# Patient Record
Sex: Male | Born: 1955 | Race: White | Hispanic: No | Marital: Married | Smoking: Former smoker
Health system: Southern US, Community
[De-identification: ages and names within clinical notes are randomized; demographics above are authoritative.]

## PROBLEM LIST (undated history)

## (undated) DIAGNOSIS — I10 Essential (primary) hypertension: Secondary | ICD-10-CM

## (undated) DIAGNOSIS — I639 Cerebral infarction, unspecified: Secondary | ICD-10-CM

## (undated) DIAGNOSIS — I739 Peripheral vascular disease, unspecified: Secondary | ICD-10-CM

## (undated) DIAGNOSIS — I771 Stricture of artery: Secondary | ICD-10-CM

## (undated) DIAGNOSIS — N400 Enlarged prostate without lower urinary tract symptoms: Secondary | ICD-10-CM

## (undated) HISTORY — PX: ILIAC ARTERY STENT: SHX1786

## (undated) HISTORY — DX: Cerebral infarction, unspecified: I63.9

## (undated) HISTORY — PX: BRAIN SURGERY: SHX531

---

## 2017-10-16 DIAGNOSIS — R030 Elevated blood-pressure reading, without diagnosis of hypertension: Secondary | ICD-10-CM | POA: Insufficient documentation

## 2020-05-12 LAB — COLOGUARD

## 2021-03-20 LAB — COLOGUARD: Cologuard: NEGATIVE

## 2021-03-27 LAB — COLOGUARD: COLOGUARD: NEGATIVE

## 2021-08-29 ENCOUNTER — Emergency Department (HOSPITAL_COMMUNITY): Payer: 59

## 2021-08-29 ENCOUNTER — Inpatient Hospital Stay (HOSPITAL_COMMUNITY)
Admission: EM | Admit: 2021-08-29 | Discharge: 2021-09-04 | DRG: 023 | Disposition: A | Discharge status: Rehabilitation Facility | Payer: 59 | Attending: Neurology | Admitting: Neurology

## 2021-08-29 ENCOUNTER — Encounter (HOSPITAL_COMMUNITY): Payer: Self-pay | Admitting: Student in an Organized Health Care Education/Training Program

## 2021-08-29 ENCOUNTER — Inpatient Hospital Stay (HOSPITAL_COMMUNITY): Payer: 59

## 2021-08-29 DIAGNOSIS — E785 Hyperlipidemia, unspecified: Secondary | ICD-10-CM | POA: Diagnosis present

## 2021-08-29 DIAGNOSIS — I63541 Cerebral infarction due to unspecified occlusion or stenosis of right cerebellar artery: Secondary | ICD-10-CM | POA: Diagnosis not present

## 2021-08-29 DIAGNOSIS — Z79899 Other long term (current) drug therapy: Secondary | ICD-10-CM | POA: Diagnosis not present

## 2021-08-29 DIAGNOSIS — I358 Other nonrheumatic aortic valve disorders: Secondary | ICD-10-CM | POA: Diagnosis present

## 2021-08-29 DIAGNOSIS — I651 Occlusion and stenosis of basilar artery: Secondary | ICD-10-CM | POA: Diagnosis not present

## 2021-08-29 DIAGNOSIS — I6340 Cerebral infarction due to embolism of unspecified cerebral artery: Secondary | ICD-10-CM | POA: Insufficient documentation

## 2021-08-29 DIAGNOSIS — Z95820 Peripheral vascular angioplasty status with implants and grafts: Secondary | ICD-10-CM

## 2021-08-29 DIAGNOSIS — R739 Hyperglycemia, unspecified: Secondary | ICD-10-CM | POA: Diagnosis present

## 2021-08-29 DIAGNOSIS — I739 Peripheral vascular disease, unspecified: Secondary | ICD-10-CM | POA: Diagnosis present

## 2021-08-29 DIAGNOSIS — R339 Retention of urine, unspecified: Secondary | ICD-10-CM | POA: Diagnosis not present

## 2021-08-29 DIAGNOSIS — E876 Hypokalemia: Secondary | ICD-10-CM | POA: Diagnosis not present

## 2021-08-29 DIAGNOSIS — H9191 Unspecified hearing loss, right ear: Secondary | ICD-10-CM | POA: Diagnosis present

## 2021-08-29 DIAGNOSIS — I161 Hypertensive emergency: Secondary | ICD-10-CM | POA: Diagnosis not present

## 2021-08-29 DIAGNOSIS — R233 Spontaneous ecchymoses: Secondary | ICD-10-CM | POA: Diagnosis present

## 2021-08-29 DIAGNOSIS — R531 Weakness: Secondary | ICD-10-CM | POA: Diagnosis present

## 2021-08-29 DIAGNOSIS — R270 Ataxia, unspecified: Secondary | ICD-10-CM | POA: Diagnosis present

## 2021-08-29 DIAGNOSIS — I10 Essential (primary) hypertension: Secondary | ICD-10-CM | POA: Diagnosis present

## 2021-08-29 DIAGNOSIS — Z87891 Personal history of nicotine dependence: Secondary | ICD-10-CM | POA: Diagnosis not present

## 2021-08-29 DIAGNOSIS — I6302 Cerebral infarction due to thrombosis of basilar artery: Secondary | ICD-10-CM | POA: Diagnosis present

## 2021-08-29 DIAGNOSIS — I6322 Cerebral infarction due to unspecified occlusion or stenosis of basilar arteries: Secondary | ICD-10-CM | POA: Diagnosis not present

## 2021-08-29 DIAGNOSIS — I63111 Cerebral infarction due to embolism of right vertebral artery: Secondary | ICD-10-CM | POA: Diagnosis not present

## 2021-08-29 DIAGNOSIS — Z20822 Contact with and (suspected) exposure to covid-19: Secondary | ICD-10-CM | POA: Diagnosis present

## 2021-08-29 DIAGNOSIS — Z888 Allergy status to other drugs, medicaments and biological substances status: Secondary | ICD-10-CM | POA: Diagnosis not present

## 2021-08-29 DIAGNOSIS — R471 Dysarthria and anarthria: Secondary | ICD-10-CM | POA: Diagnosis present

## 2021-08-29 DIAGNOSIS — K5901 Slow transit constipation: Secondary | ICD-10-CM | POA: Diagnosis not present

## 2021-08-29 DIAGNOSIS — R1310 Dysphagia, unspecified: Secondary | ICD-10-CM | POA: Diagnosis present

## 2021-08-29 DIAGNOSIS — Z7982 Long term (current) use of aspirin: Secondary | ICD-10-CM | POA: Diagnosis not present

## 2021-08-29 DIAGNOSIS — R29810 Facial weakness: Secondary | ICD-10-CM | POA: Diagnosis present

## 2021-08-29 DIAGNOSIS — I639 Cerebral infarction, unspecified: Secondary | ICD-10-CM

## 2021-08-29 DIAGNOSIS — I69351 Hemiplegia and hemiparesis following cerebral infarction affecting right dominant side: Secondary | ICD-10-CM | POA: Diagnosis not present

## 2021-08-29 DIAGNOSIS — I951 Orthostatic hypotension: Secondary | ICD-10-CM | POA: Diagnosis not present

## 2021-08-29 DIAGNOSIS — H5500 Unspecified nystagmus: Secondary | ICD-10-CM | POA: Diagnosis present

## 2021-08-29 DIAGNOSIS — Q2112 Patent foramen ovale: Secondary | ICD-10-CM | POA: Diagnosis not present

## 2021-08-29 HISTORY — DX: Essential (primary) hypertension: I10

## 2021-08-29 HISTORY — DX: Peripheral vascular disease, unspecified: I73.9

## 2021-08-29 LAB — COMPREHENSIVE METABOLIC PANEL
ALT: 15 U/L (ref 0–44)
AST: 22 U/L (ref 15–41)
Albumin: 3.7 g/dL (ref 3.5–5.0)
Alkaline Phosphatase: 59 U/L (ref 38–126)
Anion gap: 11 (ref 5–15)
BUN: 10 mg/dL (ref 8–23)
CO2: 17 mmol/L — ABNORMAL LOW (ref 22–32)
Calcium: 9 mg/dL (ref 8.9–10.3)
Chloride: 108 mmol/L (ref 98–111)
Creatinine, Ser: 1.03 mg/dL (ref 0.61–1.24)
GFR, Estimated: >60 mL/min (ref >60)
Glucose, Bld: 183 mg/dL — ABNORMAL HIGH (ref 70–99)
Potassium: 3.6 mmol/L (ref 3.5–5.1)
Sodium: 136 mmol/L (ref 135–145)
Total Bilirubin: 0.8 mg/dL (ref 0.3–1.2)
Total Protein: 6.4 g/dL — ABNORMAL LOW (ref 6.5–8.1)

## 2021-08-29 LAB — CBC
HCT: 41 % (ref 39.0–52.0)
HCT: 43.7 % (ref 39.0–52.0)
Hemoglobin: 13.7 g/dL (ref 13.0–17.0)
Hemoglobin: 14.6 g/dL (ref 13.0–17.0)
MCH: 31.7 pg (ref 26.0–34.0)
MCH: 31.9 pg (ref 26.0–34.0)
MCHC: 33.4 g/dL (ref 30.0–36.0)
MCHC: 33.4 g/dL (ref 30.0–36.0)
MCV: 94.9 fL (ref 80.0–100.0)
MCV: 95.6 fL (ref 80.0–100.0)
Platelets: 284 10*3/uL (ref 150–400)
Platelets: 378 10*3/uL (ref 150–400)
RBC: 4.32 MIL/uL (ref 4.22–5.81)
RBC: 4.57 MIL/uL (ref 4.22–5.81)
RDW: 13.6 % (ref 11.5–15.5)
RDW: 13.9 % (ref 11.5–15.5)
WBC: 11 10*3/uL — ABNORMAL HIGH (ref 4.0–10.5)
WBC: 12.2 10*3/uL — ABNORMAL HIGH (ref 4.0–10.5)
nRBC: 0 % (ref 0.0–0.2)
nRBC: 0 % (ref 0.0–0.2)

## 2021-08-29 LAB — APTT: aPTT: 28 seconds (ref 24–36)

## 2021-08-29 LAB — DIFFERENTIAL
Abs Immature Granulocytes: 0.06 10*3/uL (ref 0.00–0.07)
Basophils Absolute: 0.1 10*3/uL (ref 0.0–0.1)
Basophils Relative: 1 %
Eosinophils Absolute: 1.2 10*3/uL — ABNORMAL HIGH (ref 0.0–0.5)
Eosinophils Relative: 11 %
Immature Granulocytes: 1 %
Lymphocytes Relative: 36 %
Lymphs Abs: 4 10*3/uL (ref 0.7–4.0)
Monocytes Absolute: 0.7 10*3/uL (ref 0.1–1.0)
Monocytes Relative: 7 %
Neutro Abs: 5 10*3/uL (ref 1.7–7.7)
Neutrophils Relative %: 44 %

## 2021-08-29 LAB — HEMOGLOBIN A1C
Hgb A1c MFr Bld: 5.5 % (ref 4.8–5.6)
Mean Plasma Glucose: 111.15 mg/dL

## 2021-08-29 LAB — I-STAT CHEM 8, ED
BUN: 10 mg/dL (ref 8–23)
Calcium, Ion: 1.07 mmol/L — ABNORMAL LOW (ref 1.15–1.40)
Chloride: 107 mmol/L (ref 98–111)
Creatinine, Ser: 0.9 mg/dL (ref 0.61–1.24)
Glucose, Bld: 188 mg/dL — ABNORMAL HIGH (ref 70–99)
HCT: 42 % (ref 39.0–52.0)
Hemoglobin: 14.3 g/dL (ref 13.0–17.0)
Potassium: 3.5 mmol/L (ref 3.5–5.1)
Sodium: 139 mmol/L (ref 135–145)
TCO2: 17 mmol/L — ABNORMAL LOW (ref 22–32)

## 2021-08-29 LAB — CBG MONITORING, ED: Glucose-Capillary: 178 mg/dL — ABNORMAL HIGH (ref 70–99)

## 2021-08-29 LAB — RESP PANEL BY RT-PCR (FLU A&B, COVID) ARPGX2
Influenza A by PCR: NEGATIVE
Influenza B by PCR: NEGATIVE
SARS Coronavirus 2 by RT PCR: NEGATIVE

## 2021-08-29 LAB — GLUCOSE, CAPILLARY
Glucose-Capillary: 143 mg/dL — ABNORMAL HIGH (ref 70–99)
Glucose-Capillary: 151 mg/dL — ABNORMAL HIGH (ref 70–99)
Glucose-Capillary: 155 mg/dL — ABNORMAL HIGH (ref 70–99)

## 2021-08-29 LAB — PROTIME-INR
INR: 1 (ref 0.8–1.2)
Prothrombin Time: 13 seconds (ref 11.4–15.2)

## 2021-08-29 LAB — HEPARIN LEVEL (UNFRACTIONATED): Heparin Unfractionated: 0.17 IU/mL — ABNORMAL LOW (ref 0.30–0.70)

## 2021-08-29 LAB — HIV ANTIBODY (ROUTINE TESTING W REFLEX): HIV Screen 4th Generation wRfx: NONREACTIVE

## 2021-08-29 LAB — MRSA NEXT GEN BY PCR, NASAL: MRSA by PCR Next Gen: NOT DETECTED

## 2021-08-29 MED ORDER — TICAGRELOR 90 MG PO TABS
180.0000 mg | ORAL_TABLET | Freq: Once | ORAL | Status: AC
  Administered 2021-08-29: 180 mg via ORAL
  Filled 2021-08-29: qty 2

## 2021-08-29 MED ORDER — ATORVASTATIN CALCIUM 80 MG PO TABS
80.0000 mg | ORAL_TABLET | Freq: Every day | ORAL | Status: DC
  Administered 2021-08-29 – 2021-09-03 (×6): 80 mg via ORAL
  Filled 2021-08-29 (×6): qty 1

## 2021-08-29 MED ORDER — ONDANSETRON HCL 4 MG/2ML IJ SOLN
INTRAMUSCULAR | Status: AC
  Filled 2021-08-29: qty 2

## 2021-08-29 MED ORDER — IOHEXOL 350 MG/ML SOLN
100.0000 mL | Freq: Once | INTRAVENOUS | Status: AC | PRN
  Administered 2021-08-29: 100 mL via INTRAVENOUS

## 2021-08-29 MED ORDER — CLOPIDOGREL BISULFATE 75 MG PO TABS: 75.0000 mg | ORAL_TABLET | Freq: Every day | ORAL | Status: DC

## 2021-08-29 MED ORDER — HEPARIN (PORCINE) 25000 UT/250ML-% IV SOLN
1300.0000 [IU]/h | INTRAVENOUS | Status: DC
  Administered 2021-08-29 (×2): 1100 [IU]/h via INTRAVENOUS
  Administered 2021-08-29: 1300 [IU]/h via INTRAVENOUS
  Administered 2021-08-29 (×2): 1100 [IU]/h via INTRAVENOUS
  Administered 2021-08-30: 1300 [IU]/h via INTRAVENOUS
  Filled 2021-08-29: qty 250

## 2021-08-29 MED ORDER — STROKE: EARLY STAGES OF RECOVERY BOOK
Freq: Once | Status: AC
  Filled 2021-08-29: qty 1

## 2021-08-29 MED ORDER — CLEVIDIPINE BUTYRATE 0.5 MG/ML IV EMUL
0.0000 mg/h | INTRAVENOUS | Status: DC
  Administered 2021-08-29 (×2): 5 mg/h via INTRAVENOUS
  Administered 2021-08-29: 1 mg/h via INTRAVENOUS
  Administered 2021-08-30: 10 mg/h via INTRAVENOUS
  Filled 2021-08-29 (×4): qty 50

## 2021-08-29 MED ORDER — INSULIN ASPART 100 UNIT/ML IJ SOLN
2.0000 [IU] | INTRAMUSCULAR | Status: DC
Start: 2021-08-29 — End: 2021-09-04
  Administered 2021-08-29: 4 [IU] via SUBCUTANEOUS
  Administered 2021-08-29: 2 [IU] via SUBCUTANEOUS
  Administered 2021-08-30: 4 [IU] via SUBCUTANEOUS
  Administered 2021-08-30 – 2021-09-04 (×8): 2 [IU] via SUBCUTANEOUS
  Administered 2021-09-04: 4 [IU] via SUBCUTANEOUS

## 2021-08-29 MED ORDER — LABETALOL HCL 5 MG/ML IV SOLN
10.0000 mg | INTRAVENOUS | Status: DC | PRN
  Administered 2021-08-29 – 2021-08-31 (×6): 10 mg via INTRAVENOUS
  Filled 2021-08-29 (×6): qty 4

## 2021-08-29 MED ORDER — SENNOSIDES-DOCUSATE SODIUM 8.6-50 MG PO TABS
1.0000 | ORAL_TABLET | Freq: Every evening | ORAL | Status: DC | PRN
  Administered 2021-09-02 – 2021-09-04 (×2): 1 via ORAL
  Filled 2021-08-29 (×2): qty 1

## 2021-08-29 MED ORDER — SODIUM CHLORIDE 0.9% FLUSH
3.0000 mL | Freq: Once | INTRAVENOUS | Status: AC
  Administered 2021-08-29: 3 mL via INTRAVENOUS

## 2021-08-29 MED ORDER — ASPIRIN 81 MG PO CHEW
81.0000 mg | CHEWABLE_TABLET | Freq: Every day | ORAL | Status: DC
  Administered 2021-08-29 – 2021-08-30 (×2): 81 mg via ORAL
  Filled 2021-08-29 (×2): qty 1

## 2021-08-29 MED ORDER — SODIUM CHLORIDE 0.9 % IV SOLN: INTRAVENOUS | Status: DC

## 2021-08-29 MED ORDER — CLOPIDOGREL BISULFATE 75 MG PO TABS: 300.0000 mg | ORAL_TABLET | Freq: Once | ORAL | Status: DC

## 2021-08-29 MED ORDER — ACETAMINOPHEN 650 MG RE SUPP: 650.0000 mg | RECTAL | Status: DC | PRN

## 2021-08-29 MED ORDER — ONDANSETRON HCL 4 MG/2ML IJ SOLN
4.0000 mg | Freq: Four times a day (QID) | INTRAMUSCULAR | Status: DC | PRN
  Administered 2021-08-29 – 2021-08-31 (×3): 4 mg via INTRAVENOUS
  Filled 2021-08-29 (×2): qty 2

## 2021-08-29 MED ORDER — HYDRALAZINE HCL 20 MG/ML IJ SOLN: 10.0000 mg | INTRAMUSCULAR | Status: DC | PRN

## 2021-08-29 MED ORDER — ACETAMINOPHEN 160 MG/5ML PO SOLN: 650.0000 mg | ORAL | Status: DC | PRN

## 2021-08-29 MED ORDER — ASPIRIN 325 MG PO TABS: 650.0000 mg | ORAL_TABLET | Freq: Once | ORAL | Status: DC

## 2021-08-29 MED ORDER — ACETAMINOPHEN 325 MG PO TABS: 650.0000 mg | ORAL_TABLET | ORAL | Status: DC | PRN

## 2021-08-29 MED ORDER — TICAGRELOR 90 MG PO TABS
90.0000 mg | ORAL_TABLET | Freq: Two times a day (BID) | ORAL | Status: DC
  Administered 2021-08-30: 90 mg via ORAL
  Filled 2021-08-29: qty 1

## 2021-08-29 NOTE — Progress Notes (Signed)
ANTICOAGULATION CONSULT NOTE - Initial Consult  Pharmacy Consult for IV Heparin Indication: stroke  Allergies  Allergen Reactions   Duloxetine Tinitus    Other reaction(s): Other (See Comments) Dizziness Dizziness, tinnitus     Patient Measurements: Weight: 92 kg (202 lb 13.2 oz) Height: 72 inches Heparin Dosing Weight: 92 kg  Vital Signs: Temp: 97.8 F (36.6 C) (11/02 1600) Temp Source: Axillary (11/02 1600) BP: 157/92 (11/02 1730) Pulse Rate: 77 (11/02 1730)  Labs: Recent Labs    08/29/21 0932 08/29/21 0934 08/29/21 1646  HGB 13.7 14.3  --   HCT 41.0 42.0  --   PLT 378  --   --   APTT 28  --   --   LABPROT 13.0  --   --   INR 1.0  --   --   HEPARINUNFRC  --   --  0.17*  CREATININE 1.03 0.90  --     CrCl cannot be calculated (Unknown ideal weight.).  Medical History: Past Medical History:  Diagnosis Date   PAD (peripheral artery disease) [facility])     Assessment: 65 yr old man presented as code stroke; he had hx of dizziness/HA on and off since 10/31-11/1, no intervention, CT head with cerebellar infarcts.  Pt was no on anticoagulation PTA. Pharmacy was consulted to start heparin infusion using stroke protocol (target low end goals and no bolus).    Initial heparin level ~5.5 hrs after starting heparin infusion at 1100 units/hr (no bolus) was 0.17 units/ml, which is below the goal range for this pt. Per Florentina Addison, RN, pt's IV has been infusing since the heparin started, including when pt was in MRI mid-day (although MRI pump info doesn't cross over to [facility]); no bleeding issues observed.  Pt is tentatively scheduled for image-guided diagnosis cerebral arteriogram on 08/30/21. Pt was loaded on Brilinta today.  Goal of Therapy:  Heparin level 0.3-0.5 units/ml Monitor platelets by anticoagulation protocol: Yes   Plan:  Increase heparin infusion to 1300 units/hr Check heparin level in 6 hrs Monitor daily heparin level, CBC Monitor for bleeding  Vicki Mallet,  PharmD, BCPS, [facility] Clinical Pharmacist 08/29/2021 6:31 PM

## 2021-08-29 NOTE — Progress Notes (Signed)
ANTICOAGULATION CONSULT NOTE - Initial Consult  Pharmacy Consult for heparin Indication: stroke  Not on File  Patient Measurements: Weight: 92 kg (202 lb 13.2 oz) Heparin Dosing Weight: TBW  Vital Signs: BP: 170/96 (11/02 1045) Pulse Rate: 64 (11/02 1045)  Labs: Recent Labs    08/29/21 0932 08/29/21 0934  HGB 13.7 14.3  HCT 41.0 42.0  PLT 378  --   APTT 28  --   LABPROT 13.0  --   INR 1.0  --   CREATININE 1.03 0.90    CrCl cannot be calculated (Unknown ideal weight.).   Medical History: No past medical history on file.   Assessment: 38 YOM presenting as code stroke, hx dizziness/HA on and off since 10/31-11/1, no intervention, CT head with cerebellar infarcts.  He is not on anticoagulation PTA, pharmacy consulted to start heparin gtt Stroke protocol, will target low end goals and not bolus.  CBC wnl  Goal of Therapy:  Heparin level 0.3-0.5 units/ml Monitor platelets by anticoagulation protocol: Yes   Plan:  Heparin gtt at 1100 units/hr, no bolus F/u 6 hour heparin level  Daylene Posey, PharmD Clinical Pharmacist ED Pharmacist Phone # [phone] 08/29/2021 11:10 AM

## 2021-08-29 NOTE — ED Triage Notes (Signed)
Pt here via EMS as code Stroke. EMS reports LKW 08/29/21 0700 Right side facial droop and weakness per EMS. Pt having headache and dizziness yesterday per pt.

## 2021-08-29 NOTE — Code Documentation (Addendum)
Stroke Response Nurse Documentation Code Documentation  Pharrell Ledford is a 65 y.o. male arriving to [facility] ED via [facility] on 08/29/2021 with past medical hx of HTN, hyperlipidemia, PVD. On aspirin 81 mg daily. Code stroke was activated by EMS.   Patient from home where he was LKW Monday morning about 8am. and now complaining of Dizziness, facial droop and numbness .  He had an episode of dizziness and headache Monday morning.  He took Asprin for his HA and it eventually went away.  This morning he felt normal until about 8:15am when he suddenly had another episode of dizziness and nausea and vomiting.  He felt tingling on his left side and felt weak.   Stroke team at the bedside on patient arrival. Labs drawn and patient cleared for CT by EDP. Patient to CT with team. NIHSS 6, see documentation for details and code stroke times. Patient with right facial droop, right limb ataxia, left decreased sensation, and dysarthria  on exam. The following imaging was completed:  CT, CTA head and neck, CTP. Patient is not a candidate for IV Thrombolytic due to being outside the window. Patient is not a candidate for IR due to last known well.   Care/Plan: neuro checks and VS q2hrs.   Bedside handoff with ED RN Aldean Jewett  Stroke Response RN

## 2021-08-29 NOTE — Consult Note (Signed)
Chief Complaint: Patient was seen in consultation today for  Chief Complaint  Patient presents with   Code Stroke    Referring Physician(s): Dr. Wilford Corner  Supervising Physician: Julieanne Cotton  Patient Status: [facility] - In-pt  History of Present Illness: Jhace Fennell is a 65 y.o. male with a medical history significant for peripheral artery disease. Per patient report he has a left iliac stent.  He presented to the [facility] ED as a Code Stroke 08/29/21 with complaints of left-sided weakness and facial droop. He first noticed dizziness and headaches 10/31-11/1 but the symptoms went away. Today he was using the restroom and suddenly felt left-sided tingling over his left face, arm and leg. EMS was called.    CTA 08/29/21 IMPRESSION: 1. Abnormal extracranial right vertebral artery with limited opacification proximally and reconstitution at the C4 level. May reflect high-grade origin stenosis. Right PICA or like AICA origins are not identified. Basilar artery is patent but there is question of nonocclusive clot proximally. 2. Noncalcified plaque at the right ICA origin causes 65% stenosis. Mixed plaque at the left ICA origin causes less than 50% stenosis. 3. Diffuse mild narrowing of the intracranial left vertebral artery, which terminates as a PICA. 4. Perfusion imaging demonstrates no evidence of core infarction or penumbra, but there is limited evaluation of the posterior fossa.  No TPA was administered due to being outside of treatment window. Neuro Interventional Radiology has been asked to evaluate this patient for an image-guided diagnostic cerebral angiogram for further work up.   Past Medical History:  Diagnosis Date   PAD (peripheral artery disease) (HCC)     The histories are not reviewed yet. Please review them in the "History" navigator section and refresh this SmartLink.  Allergies: Duloxetine  Medications: Prior to Admission medications   Medication Sig Start  Date End Date Taking? Authorizing Provider  Acetylcysteine (NAC PO) Take 1 tablet by mouth daily.   Yes [provider]  aspirin EC 81 MG tablet Take 81 mg by mouth daily. Swallow whole.   Yes [provider]  Cyanocobalamin (B-12 PO) Take 1 tablet by mouth daily.   Yes [provider]  ibuprofen (ADVIL) 200 MG tablet Take 400 mg by mouth every 6 (six) hours as needed for fever, headache or mild pain.   Yes [provider]  OVER THE COUNTER MEDICATION Take 1 tablet by mouth daily. Delta 3 gummie   Yes [provider]     No family history on file.  Social History   Socioeconomic History   Marital status: Married    Spouse name: Not on file   Number of children: Not on file   Years of education: Not on file   Highest education level: Not on file  Occupational History   Not on file  Tobacco Use   Smoking status: Not on file   Smokeless tobacco: Not on file  Substance and Sexual Activity   Alcohol use: Not on file   Drug use: Not on file   Sexual activity: Not on file  Other Topics Concern   Not on file  Social History Narrative   Not on file   Social Determinants of Health   Financial Resource Strain: Not on file  Food Insecurity: Not on file  Transportation Needs: Not on file  Physical Activity: Not on file  Stress: Not on file  Social Connections: Not on file    Review of Systems: A 12 point ROS discussed and pertinent positives are  indicated in the HPI above.  All other systems are negative.  Review of Systems  Constitutional:  Positive for fatigue.  Respiratory:  Negative for cough and shortness of breath.   Cardiovascular:  Negative for chest pain and leg swelling.  Gastrointestinal:  Positive for nausea. Negative for abdominal pain, diarrhea and vomiting.  Neurological:  Positive for facial asymmetry and weakness.   Vital Signs: BP (!) 157/98   Pulse 77   Temp 97.8 F (36.6 C) (Oral)   Resp 17   Wt 202 lb 13.2  oz (92 kg)   SpO2 98%   Physical Exam Constitutional:      General: He is not in acute distress.    Appearance: He is ill-appearing.  HENT:     Mouth/Throat:     Mouth: Mucous membranes are moist.     Pharynx: Oropharynx is clear.  Eyes:     General: Visual field deficit present.  Cardiovascular:     Rate and Rhythm: Normal rate and regular rhythm.     Pulses: Normal pulses.     Heart sounds: Normal heart sounds.  Pulmonary:     Effort: Pulmonary effort is normal.     Breath sounds: Normal breath sounds.  Abdominal:     General: Bowel sounds are normal.     Palpations: Abdomen is soft.     Tenderness: There is no abdominal tenderness.  Musculoskeletal:     Right lower leg: No edema.     Left lower leg: No edema.  Neurological:     Mental Status: He is lethargic.     Cranial Nerves: Facial asymmetry present.     Motor: Weakness present.     Comments: Noted bilateral eye oscillation. Right pupil 2 mm, left pupil 3 mm. Patient endorses blurred/double vision. Left sided weakness with decreased sensation. Right facial droop    Imaging: MR BRAIN WO CONTRAST  Result Date: 08/29/2021 CLINICAL DATA:  Stroke, follow up EXAM: MRI HEAD WITHOUT CONTRAST TECHNIQUE: Multiplanar, multiecho pulse sequences of the brain and surrounding structures were obtained without intravenous contrast. COMPARISON:  None. FINDINGS: Brain: Reduced diffusion is present in the inferior right cerebellum. Additional small foci of involvement in the right brachium pontis and right pontomedullary junction. No significant mass effect. No evidence of intracranial hemorrhage. Patchy foci of T2 hyperintensity in the supratentorial white matter are nonspecific but may reflect mild chronic microvascular ischemic changes. Ventricles and sulci are within normal limits in size and configuration. Vascular: Major vessel flow voids at the skull base are preserved. Skull and upper cervical spine: Normal marrow signal is preserved.  Sinuses/Orbits: Paranasal sinus mucosal thickening. Orbits are unremarkable. Other: Sella is unremarkable.  Mastoid air cells are clear. 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 Small additional involvement of right brachium pontis and right pontomedullary junction. No hemorrhage or significant mass effect. Mild chronic microvascular ischemic changes. Electronically Signed   By: Guadlupe Spanish M.D.   On: 08/29/2021 13:03   CT CEREBRAL PERFUSION W CONTRAST  Result Date: 08/29/2021 CLINICAL DATA:  Neuro deficit, acute, stroke suspected EXAM: CT ANGIOGRAPHY HEAD AND NECK CT PERFUSION BRAIN TECHNIQUE: Multidetector CT imaging of the head and neck was performed using the standard protocol during bolus administration of intravenous contrast. Multiplanar CT image reconstructions and MIPs were obtained to evaluate the vascular anatomy. Carotid stenosis measurements (when applicable) are obtained utilizing NASCET criteria, using the distal internal carotid diameter as the denominator. Multiphase CT imaging of the brain was performed following IV bolus contrast  injection. Subsequent parametric perfusion maps were calculated using RAPID software. CONTRAST:  100 mL Omnipaque 350 COMPARISON:  None. FINDINGS: CTA NECK Aortic arch: Great vessel origins are patent. Right carotid system: Patent. Noncalcified plaque at the ICA origin causes 65% stenosis. Left carotid system: Patent. Mixed plaque at the ICA origin causes less than 50% stenosis. Vertebral arteries: Left vertebral artery is patent. Absent opacification of the proximal right vertebral artery at the origin with minimal reconstitution of the V1 segment followed by reocclusion. The V2 segment is occluded proximally with reconstitution at the C4 level. Skeleton: Mild cervical spine degenerative changes, greatest at C6-C7. Other neck: Unremarkable. Upper chest: No apical lung mass. Review of the MIP images confirms the above findings CTA HEAD  Anterior circulation: Intracranial internal carotid arteries are patent with mild calcified plaque. Anterior and middle cerebral arteries are patent. Posterior circulation: There is decreased caliber of the intracranial left vertebral artery throughout its course. Appears to terminate at patent left PICA. Intracranial right vertebral artery is patent. Basilar is patent but there is low density within the lumen proximally. Superior cerebellar artery origins are patent. Bilateral posterior communicating arteries are present. Posterior cerebral arteries are patent. Venous sinuses: As permitted by contrast timing, patent. Review of the MIP images confirms the above findings CT Brain Perfusion Findings: CBF (<30%) Volume: 46mL Perfusion (Tmax>6.0s) volume: 7mL Mismatch Volume: 68mL Infarction Location: None. IMPRESSION: Abnormal extracranial right vertebral artery with limited opacification proximally and reconstitution at the C4 level. May reflect high-grade origin stenosis. Right PICA or like AICA origins are not identified. Basilar artery is patent but there is question of nonocclusive clot proximally. Noncalcified plaque at the right ICA origin causes 65% stenosis. Mixed plaque at the left ICA origin causes less than 50% stenosis. Diffuse mild narrowing of the intracranial left vertebral artery, which terminates as a PICA. Perfusion imaging demonstrates no evidence of core infarction or penumbra, but there is limited evaluation of the posterior fossa. Initial results were provided by telephone at the time of interpretation on 08/29/2021 at 10:20 am to provider [facility] , who verbally acknowledged these results. Electronically Signed   By: Guadlupe Spanish M.D.   On: 08/29/2021 10:44   CT HEAD CODE STROKE WO CONTRAST  Result Date: 08/29/2021 CLINICAL DATA:  Code stroke.  Left-sided weakness EXAM: CT HEAD WITHOUT CONTRAST TECHNIQUE: Contiguous axial images were obtained from the base of the skull through the vertex  without intravenous contrast. COMPARISON:  None. FINDINGS: Brain: There is no acute intracranial hemorrhage, mass effect, or edema. Gray-white differentiation is preserved. There is an age-indeterminate small infarct of the right inferior cerebellum. Ventricles and sulci are normal in size and configuration. No extra-axial collection. Vascular: No hyperdense vessel. Intracranial atherosclerotic calcification at the skull base. Skull: Unremarkable. Sinuses/Orbits: Lobular mucosal thickening. Orbits are unremarkable. Other: Mastoid air cells are clear. ASPECTS [facility] Stroke Program Early CT Score) - Ganglionic level infarction (caudate, lentiform nuclei, internal capsule, insula, M1-M3 cortex): 7 - Supraganglionic infarction (M4-M6 cortex): 3 Total score (0-10 with 10 being normal): 10 IMPRESSION: There is no acute intracranial hemorrhage. ASPECT score is 10. 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 These results were communicated to Dr. Wilford Corner at 9:44 am on 08/29/2021 by text page via the [facility] messaging system. Electronically Signed   By: Guadlupe Spanish M.D.   On: 08/29/2021 09:46   CT ANGIO HEAD NECK W WO CM (CODE STROKE)  Result Date: 08/29/2021 CLINICAL DATA:  Neuro deficit, acute, stroke suspected EXAM: CT ANGIOGRAPHY HEAD  AND NECK CT PERFUSION BRAIN TECHNIQUE: Multidetector CT imaging of the head and neck was performed using the standard protocol during bolus administration of intravenous contrast. Multiplanar CT image reconstructions and MIPs were obtained to evaluate the vascular anatomy. Carotid stenosis measurements (when applicable) are obtained utilizing NASCET criteria, using the distal internal carotid diameter as the denominator. Multiphase CT imaging of the brain was performed following IV bolus contrast injection. Subsequent parametric perfusion maps were calculated using RAPID software. CONTRAST:  100 mL Omnipaque 350 COMPARISON:  None. FINDINGS: CTA NECK Aortic arch: Great  vessel origins are patent. Right carotid system: Patent. Noncalcified plaque at the ICA origin causes 65% stenosis. Left carotid system: Patent. Mixed plaque at the ICA origin causes less than 50% stenosis. Vertebral arteries: Left vertebral artery is patent. Absent opacification of the proximal right vertebral artery at the origin with minimal reconstitution of the V1 segment followed by reocclusion. The V2 segment is occluded proximally with reconstitution at the C4 level. Skeleton: Mild cervical spine degenerative changes, greatest at C6-C7. Other neck: Unremarkable. Upper chest: No apical lung mass. Review of the MIP images confirms the above findings CTA HEAD Anterior circulation: Intracranial internal carotid arteries are patent with mild calcified plaque. Anterior and middle cerebral arteries are patent. Posterior circulation: There is decreased caliber of the intracranial left vertebral artery throughout its course. Appears to terminate at patent left PICA. Intracranial right vertebral artery is patent. Basilar is patent but there is low density within the lumen proximally. Superior cerebellar artery origins are patent. Bilateral posterior communicating arteries are present. Posterior cerebral arteries are patent. Venous sinuses: As permitted by contrast timing, patent. Review of the MIP images confirms the above findings CT Brain Perfusion Findings: CBF (<30%) Volume: 59mL Perfusion (Tmax>6.0s) volume: 68mL Mismatch Volume: 80mL Infarction Location: None. IMPRESSION: Abnormal extracranial right vertebral artery with limited opacification proximally and reconstitution at the C4 level. May reflect high-grade origin stenosis. Right PICA or like AICA origins are not identified. Basilar artery is patent but there is question of nonocclusive clot proximally. Noncalcified plaque at the right ICA origin causes 65% stenosis. Mixed plaque at the left ICA origin causes less than 50% stenosis. Diffuse mild narrowing of  the intracranial left vertebral artery, which terminates as a PICA. Perfusion imaging demonstrates no evidence of core infarction or penumbra, but there is limited evaluation of the posterior fossa. Initial results were provided by telephone at the time of interpretation on 08/29/2021 at 10:20 am to provider [facility] , who verbally acknowledged these results. Electronically Signed   By: Guadlupe Spanish M.D.   On: 08/29/2021 10:44    Labs:  CBC: Recent Labs    08/29/21 0932 08/29/21 0934  WBC 11.0*  --   HGB 13.7 14.3  HCT 41.0 42.0  PLT 378  --     COAGS: Recent Labs    08/29/21 0932  INR 1.0  APTT 28    BMP: Recent Labs    08/29/21 0932 08/29/21 0934  NA 136 139  K 3.6 3.5  CL 108 107  CO2 17*  --   GLUCOSE 183* 188*  BUN 10 10  CALCIUM 9.0  --   CREATININE 1.03 0.90  GFRNONAA >60  --     LIVER FUNCTION TESTS: Recent Labs    08/29/21 0932  BILITOT 0.8  AST 22  ALT 15  ALKPHOS 59  PROT 6.4*  ALBUMIN 3.7    TUMOR MARKERS: No results for input(s): AFPTM, CEA, CA199, CHROMGRNA in the last  8760 hours.  Assessment and Plan:  Thrombosis of basilar artery; Code Stroke: Raydon Chappuis, 65 year old male, is tentatively scheduled for an image-guided diagnostic cerebral arteriogram 08/30/21. He is currently stable on heparin infusion and will be started on Brilinta today. Orders placed for loading dose of Brilinta 180 mg followed by 90 mg BID. Plavix orders have been cancelled. Dr. Wilford Corner to be notified by Dr. Corliss Skains that if the patient experiences any worsening of his symptoms he may need an emergent intervention overnight.  Risks and benefits of this procedure were discussed with the patient including, but not limited to bleeding, infection, vascular injury or contrast induced renal failure.  This interventional procedure involves the use of X-rays and because of the nature of the planned procedure, it is possible that we will have prolonged use of X-[place]  fluoroscopy.  Potential radiation risks to you include (but are not limited to) the following: - A slightly elevated risk for cancer  several years later in life. This risk is typically less than 0.5% percent. This risk is low in comparison to the normal incidence of human cancer, which is 33% for women and 50% for men according to the [employer] Society. - Radiation induced injury can include skin redness, resembling a rash, tissue breakdown / ulcers and hair loss (which can be temporary or permanent).   The likelihood of either of these occurring depends on the difficulty of the procedure and whether you are sensitive to radiation due to previous procedures, disease, or genetic conditions.   IF your procedure requires a prolonged use of radiation, you will be notified and given written instructions for further action.  It is your responsibility to monitor the irradiated area for the 2 weeks following the procedure and to notify your physician if you are concerned that you have suffered a radiation induced injury.    All of the patient's questions were answered, patient is agreeable to proceed. He will be NPO at midnight.   Consent signed and in IR  Thank you for this interesting consult.  I greatly enjoyed meeting Kye Hedden and look forward to participating in their care.  A copy of this report was sent to the requesting provider on this date.  Electronically Signed: Alwyn Ren, AGACNP-BC [phone] 08/29/2021, 4:33 PM   I spent a total of 20 Minutes    in face to face in clinical consultation, greater than 50% of which was counseling/coordinating care for diagnostic cerebral angiogram.

## 2021-08-29 NOTE — H&P (Addendum)
Neurology Admission H&P    Chief Complaint: stroke code, left sided weakness and facial droop per ems   CC: left sided tingling x 2 hours   HPI  Mr. Chung Suarez presents as code stroke via EMS when he experienced left sided tingling prior to arrival, 2 hours prior to presentation. His troubles began between 10/31-11/1, when he noted sudden onset dizziness and headache. He laid on the ground and these eventually subsided over the course of an hour, although they returned this AM.   2 hours prior to arrival, he had been having a bowel movement when he suddenly felt left sided tingling "like neuropathy" of left face, arm, leg. Has been constant since onset without change in character. No weakness.   History is obtained from:patient and his wife.   No personal or FMHx of stroke.   After brief exam at ED bridge, patient taken emergently to CT suite.  CTH  reveals subacute appearing right cerebellar infarcts.  CTA head and neck with atherosclerosis throughout but most concerning for non-occlusive basilar thrombus   LKW: between 10/31-11/1  tPA: no, OOW  IR: planned DSA in AM of 11/3-not a candidate for emergent thrombectomy given the time last known well, but requires an arteriogram due to subocclusive basilar thrombus and intracranial basilar atherosclerosis.. MRS:0  No past medical history on file.  No family history on file. Social History:  has no history on file for tobacco use, alcohol use, and drug use.  Allergies:  Allergies  Allergen Reactions   Duloxetine Tinitus    Other reaction(s): Other (See Comments) Dizziness Dizziness, tinnitus    ROS: A robust ROS is reviewed and is negative except as noted in the HPI.   Physical Examination: General: Appears anxious, tends to close one eye, favoring closing OS.  Psych: Affect appropriate.  Cooperative.  HEENT-  Normocephalic, AT. No lymphadenopathy. No thyromegaly. No stiffness of neck.  Cardiovascular - RRR. No LE edema.   Lungs - Normal respiratory effort. Abdomen - soft, NT. Extremities - warm, well perfused. Skin: WDI.  NIHSS:  1a Level of Consciousness: 0  1b LOC Questions: 0 1c LOC Commands: 0 2 Best Gaze: 0 3 Visual: 0 4 Facial Palsy: 2   5a Motor Arm - Left: 0 5b Motor Arm - Right: 0 6a Motor Leg - Left: 0 6b Motor Leg - Right: 0 7 Limb Ataxia: 1 RUE  8 Sensory: 1 less sensate left hemibody, including face  9 Best Language: 0 10 Dysarthria: 1 11 Extinct and Inattention:0  TOTAL: 5  Neuro exam:   Mental Status: Awake and alert. Is able to follow simple and complex commands. Oriented to person, place, month, day, date. Able to relay why here.  Speech/Language: speech is without dysarthria.  No aphasia or neglect. No gaze preference. Naming, repetition, fluency, and comprehension intact.  Cranial Nerves:  II: PERRL. Visual fields full.  III, IV, VI: EOMI. Eyelids elevate symmetrically with volitional activation. Blinks to threat.  V: Sensation is diminished to light touch left V1-3   VII: Smile is asymmetric, right central droop. Able to raise eyebrows. Puff cheeks but air escapes with force.  VIII: hearing intact to voice but reports hearing loss on right  IX, X: Palate elevates symmetrically. Phonation is normal.  [id number] shrug 5/5. XII: tongue is midline without fasciculations. Motor:  RUE:  grip   5/5    biceps  5/5    triceps  5/5      LUE: grip  5/5  biceps   5/5    triceps  5/5 RLE: thigh  5/5    knee   5/5     plantar flexion   5/5     dorsiflexion   5/5        LLE: thigh   5/5    knee  5/5    plantar flexion    5/5     dorsiflexion    5/5 Tone is normal and bulk is normal. Sensation- diminished left hemibody compared to right .  Extinction absent to light touch to DSS.  Coordination: FTN intact bilaterally, HKS: no ataxia in BLE. No drift.  DTRs: RUE: biceps 2    triceps  2   brachioradialis2                   LUE: biceps  2   triceps2     brachioradialis2             RLE: patella  2   tibial  2                                                 LLE: patella   2  tibial 2 Gait- deferred.  CTH subacute right cerebellar infarcts  MRI brain pending  CTA head and neck : non-occlusive basilar thrombus  IMPRESSION: Abnormal extracranial right vertebral artery with limited opacification proximally and reconstitution at the C4 level. May reflect high-grade origin stenosis. Right PICA or like AICA origins are not identified. Basilar artery is patent but there is question of nonocclusive clot proximally. Noncalcified plaque at the right ICA origin causes 65% stenosis. Mixed plaque at the left ICA origin causes less than 50% stenosis. Diffuse mild narrowing of the intracranial left vertebral artery, which terminates as a PICA. Perfusion imaging demonstrates no evidence of core infarction or penumbra, but there is limited evaluation of the posterior fossa  Assessment: 65 year old male with history of peripheral arterial disease s/p stenting who presents with 1-2 day history of fluctuating headache and dizziness with sudden onset left sided paresthesias and right sided hearing loss this AM 11/2 found to have subacute right cerebellar strokes and CTA with non-occlusive basilar thrombus, abnormal extracranial right vertebral artery which shows limited opacification proximally and reconstitutes at C4 level-high-grade stenosis possible.  Right PICA or right AICA not identified by CTA.  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 -Blood pressure control, goal of systolic [id number].  -[id number] panel -Hyperglycemia management per SSI to maintain glucose 140-180mg /dL. -PT/OT/ST therapies and recommendations when able -close neuro monitoring  -admit by neurology.   -Frequent neuro checks per protocol.  -NIHSS per protocol.  - Risk factor modification. -Telemetry monitoring for arrhythmia.  -stroke education.   - Stroke team to follow.  Dysarthria Dysphagia following cerebral infarction  -NPO until cleared by speech -ST -Advance diet as tolerated  RESP No acute issues   CV HTN -keep BP systolic 123XX123, avoid hypoperfusion  -  follow up echo   Hyperlipidemia, unspecified  -lipid panel pending  -atorv 80  - Statin for goal LDL < 70  Peripheral Arterial Disease s/p stenting -on asa 81 at home, has been compliant  -Currently loaded with dual antiplatelets and on heparin drip.  HEME Daily CBC   ENDO NPO until SLP sees, maint fluids on board.  -SSI and POCT Q 6 hours  -goal HgbA1c < 7  GI/GU No acute issues   Fluid/Electrolyte Disorders Trend bmp   ID No issues acutely  Could have possible aspiration-check chest x-Azeez  Nutrition Consult   Prophylaxis DVT:  none,on therpaeutic heparin gtt, SCDs GI: none indicated  Bowel: senna-s bid   Diet: NPO until cleared by speech  Code Status: Full Code  Patient seen by Sanjuana Letters, PA-C and MD. Note and plan to be edited by MD as necessary.    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 Agree with the history and physical as documented above. Agree with the plan as documented, which I helped formulate. I have independently reviewed the chart, obtained history, review of  systems and examined the patient.I have personally reviewed pertinent head/neck/spine imaging (CT/MRI).  LKW Monday at some point. Fluctuating symptoms. Exam as above. CTH with right cerebellar hypodensity. CTA h+n with possible subocclusive basilar thrombus. Not candidate for tnk due to outside window. Not a emergent EVT candidate due to outside window. Needs cerebral arteriogram in the AM for further eval of the basilar athero/subocclusive thrombus. Started IV heparin and loaded with DAPT after discussion with Dr. Corliss Skains from [facility].   Please feel free to call with any questions.  -- Milon Dikes, MD Neurologist [facility] Pager: [phone]  CRITICAL CARE ATTESTATION Performed by: Milon Dikes, MD Total critical care time: 55 minutes Critical care time was exclusive of separately billable procedures and treating other patients and/or supervising APPs/Residents/Students Critical care was necessary to treat or prevent imminent or life-threatening deterioration due to basilar stenosis, acute ischemic stroke This patient is critically ill and at significant risk for neurological worsening and/or death and care requires constant monitoring. Critical care was time spent personally by me on the following activities: development of treatment plan with patient and/or surrogate as well as nursing, discussions with consultants, evaluation of patient's response to treatment, examination of patient, obtaining history from patient or surrogate, ordering and performing treatments and interventions, ordering and review of laboratory studies, ordering and review of radiographic studies, pulse oximetry, re-evaluation of patient's condition, participation in multidisciplinary rounds and medical decision making of high complexity in the care of this patient.

## 2021-08-29 NOTE — ED Provider Notes (Signed)
[facility] EMERGENCY DEPARTMENT Provider Note   CSN: [id number] Arrival date & time: 08/29/21  7829  An emergency department physician performed an initial assessment on this suspected stroke patient at 0930.  History Chief Complaint  Patient presents with   Code Stroke    Maurice Little is a 65 y.o. male.  Patient presents to ER chief complaint of headache unsteady gait.  Symptoms began about 3 days ago been waxing waning in intensity.  He was feeling better last night and this morning until about 7:30 AM when he went to the bathroom.  He was reaching for the toilet paper when he got dizzy and lightheaded and fell.  Had recurrent unsteady gait and numbness in his left upper and left lower extremity.      No past medical history on file.  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     Home Medications Prior to Admission medications   Not on File    Allergies    Duloxetine  Review of Systems   Review of Systems  Constitutional:  Negative for fever.  HENT:  Negative for ear pain and sore throat.   Eyes:  Negative for pain.  Respiratory:  Negative for cough.   Cardiovascular:  Negative for chest pain.  Gastrointestinal:  Negative for abdominal pain.  Genitourinary:  Negative for flank pain.  Musculoskeletal:  Negative for back pain.  Skin:  Negative for color change and rash.  Neurological:  Positive for headaches. Negative for syncope.  All other systems reviewed and are negative.  Physical Exam Updated Vital Signs BP (!) 170/96   Pulse 64   Resp 18   Wt 92 kg   SpO2 100%   Physical Exam Constitutional:      Appearance: He is well-developed.  HENT:     Head: Normocephalic.     Nose: Nose normal.  Eyes:     Extraocular Movements: Extraocular movements intact.  Cardiovascular:     Rate and Rhythm: Normal rate.  Pulmonary:     Effort: Pulmonary effort is  normal.  Skin:    Coloration: Skin is not jaundiced.  Neurological:     Mental Status: He is alert.     Comments: Nurse toQuestionable right-sided facial droop, otherwise intact 12.  No upper or lower extremity drift noted.  Decree sensation to light touch in the left upper and left lower extremity.    ED Results / Procedures / Treatments   Labs (all labs ordered are listed, but only abnormal results are displayed) Labs Reviewed  CBC - Abnormal; Notable for the following components:      Result Value   WBC 11.0 (*)    All other components within normal limits  DIFFERENTIAL - Abnormal; Notable for the following components:   Eosinophils Absolute 1.2 (*)    All other components within normal limits  COMPREHENSIVE METABOLIC PANEL - Abnormal; Notable for the following components:   CO2 17 (*)    Glucose, Bld 183 (*)    Total Protein 6.4 (*)    All other components within normal limits  I-STAT CHEM 8, ED - Abnormal; Notable for the following components:   Glucose, Bld 188 (*)    Calcium, Ion 1.07 (*)    TCO2 17 (*)    All other components within normal limits  CBG MONITORING, ED - Abnormal; Notable for the following components:  Glucose-Capillary 178 (*)    All other components within normal limits  RESP PANEL BY RT-PCR (FLU A&B, COVID) ARPGX2  PROTIME-INR  APTT  HIV ANTIBODY (ROUTINE TESTING W REFLEX)    EKG None  Radiology CT CEREBRAL PERFUSION W CONTRAST  Result Date: 08/29/2021 CLINICAL DATA:  Neuro deficit, acute, stroke suspected EXAM: CT ANGIOGRAPHY HEAD AND NECK CT PERFUSION BRAIN TECHNIQUE: Multidetector CT imaging of the head and neck was performed using the standard protocol during bolus administration of intravenous contrast. Multiplanar CT image reconstructions and MIPs were obtained to evaluate the vascular anatomy. Carotid stenosis measurements (when applicable) are obtained utilizing NASCET criteria, using the distal internal carotid diameter as the  denominator. Multiphase CT imaging of the brain was performed following IV bolus contrast injection. Subsequent parametric perfusion maps were calculated using RAPID software. CONTRAST:  100 mL Omnipaque 350 COMPARISON:  None. FINDINGS: CTA NECK Aortic arch: Great vessel origins are patent. Right carotid system: Patent. Noncalcified plaque at the ICA origin causes 65% stenosis. Left carotid system: Patent. Mixed plaque at the ICA origin causes less than 50% stenosis. Vertebral arteries: Left vertebral artery is patent. Absent opacification of the proximal right vertebral artery at the origin with minimal reconstitution of the V1 segment followed by reocclusion. The V2 segment is occluded proximally with reconstitution at the C4 level. Skeleton: Mild cervical spine degenerative changes, greatest at C6-C7. Other neck: Unremarkable. Upper chest: No apical lung mass. Review of the MIP images confirms the above findings CTA HEAD Anterior circulation: Intracranial internal carotid arteries are patent with mild calcified plaque. Anterior and middle cerebral arteries are patent. Posterior circulation: There is decreased caliber of the intracranial left vertebral artery throughout its course. Appears to terminate at patent left PICA. Intracranial right vertebral artery is patent. Basilar is patent but there is low density within the lumen proximally. Superior cerebellar artery origins are patent. Bilateral posterior communicating arteries are present. Posterior cerebral arteries are patent. Venous sinuses: As permitted by contrast timing, patent. Review of the MIP images confirms the above findings CT Brain Perfusion Findings: CBF (<30%) Volume: 26mL Perfusion (Tmax>6.0s) volume: 87mL Mismatch Volume: 77mL Infarction Location: None. IMPRESSION: Abnormal extracranial right vertebral artery with limited opacification proximally and reconstitution at the C4 level. May reflect high-grade origin stenosis. Right PICA or like AICA  origins are not identified. Basilar artery is patent but there is question of nonocclusive clot proximally. Noncalcified plaque at the right ICA origin causes 65% stenosis. Mixed plaque at the left ICA origin causes less than 50% stenosis. Diffuse mild narrowing of the intracranial left vertebral artery, which terminates as a PICA. Perfusion imaging demonstrates no evidence of core infarction or penumbra, but there is limited evaluation of the posterior fossa. Initial results were provided by telephone at the time of interpretation on 08/29/2021 at 10:20 am to provider [facility] , who verbally acknowledged these results. Electronically Signed   By: Guadlupe Spanish M.D.   On: 08/29/2021 10:44   CT HEAD CODE STROKE WO CONTRAST  Result Date: 08/29/2021 CLINICAL DATA:  Code stroke.  Left-sided weakness EXAM: CT HEAD WITHOUT CONTRAST TECHNIQUE: Contiguous axial images were obtained from the base of the skull through the vertex without intravenous contrast. COMPARISON:  None. FINDINGS: Brain: There is no acute intracranial hemorrhage, mass effect, or edema. Gray-white differentiation is preserved. There is an age-indeterminate small infarct of the right inferior cerebellum. Ventricles and sulci are normal in size and configuration. No extra-axial collection. Vascular: No hyperdense vessel. Intracranial atherosclerotic calcification at the  skull base. Skull: Unremarkable. Sinuses/Orbits: Lobular mucosal thickening. Orbits are unremarkable. Other: Mastoid air cells are clear. ASPECTS [facility] Stroke Program Early CT Score) - Ganglionic level infarction (caudate, lentiform nuclei, internal capsule, insula, M1-M3 cortex): 7 - Supraganglionic infarction (M4-M6 cortex): 3 Total score (0-10 with 10 being normal): 10 IMPRESSION: There is no acute intracranial hemorrhage. ASPECT score is 10. 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 These results were communicated to Dr. Wilford Corner at 9:44 am on 08/29/2021 by text  page via the [facility] messaging system. Electronically Signed   By: Guadlupe Spanish M.D.   On: 08/29/2021 09:46   CT ANGIO HEAD NECK W WO CM (CODE STROKE)  Result Date: 08/29/2021 CLINICAL DATA:  Neuro deficit, acute, stroke suspected EXAM: CT ANGIOGRAPHY HEAD AND NECK CT PERFUSION BRAIN TECHNIQUE: Multidetector CT imaging of the head and neck was performed using the standard protocol during bolus administration of intravenous contrast. Multiplanar CT image reconstructions and MIPs were obtained to evaluate the vascular anatomy. Carotid stenosis measurements (when applicable) are obtained utilizing NASCET criteria, using the distal internal carotid diameter as the denominator. Multiphase CT imaging of the brain was performed following IV bolus contrast injection. Subsequent parametric perfusion maps were calculated using RAPID software. CONTRAST:  100 mL Omnipaque 350 COMPARISON:  None. FINDINGS: CTA NECK Aortic arch: Great vessel origins are patent. Right carotid system: Patent. Noncalcified plaque at the ICA origin causes 65% stenosis. Left carotid system: Patent. Mixed plaque at the ICA origin causes less than 50% stenosis. Vertebral arteries: Left vertebral artery is patent. Absent opacification of the proximal right vertebral artery at the origin with minimal reconstitution of the V1 segment followed by reocclusion. The V2 segment is occluded proximally with reconstitution at the C4 level. Skeleton: Mild cervical spine degenerative changes, greatest at C6-C7. Other neck: Unremarkable. Upper chest: No apical lung mass. Review of the MIP images confirms the above findings CTA HEAD Anterior circulation: Intracranial internal carotid arteries are patent with mild calcified plaque. Anterior and middle cerebral arteries are patent. Posterior circulation: There is decreased caliber of the intracranial left vertebral artery throughout its course. Appears to terminate at patent left PICA. Intracranial right vertebral  artery is patent. Basilar is patent but there is low density within the lumen proximally. Superior cerebellar artery origins are patent. Bilateral posterior communicating arteries are present. Posterior cerebral arteries are patent. Venous sinuses: As permitted by contrast timing, patent. Review of the MIP images confirms the above findings CT Brain Perfusion Findings: CBF (<30%) Volume: 50mL Perfusion (Tmax>6.0s) volume: 71mL Mismatch Volume: 57mL Infarction Location: None. IMPRESSION: Abnormal extracranial right vertebral artery with limited opacification proximally and reconstitution at the C4 level. May reflect high-grade origin stenosis. Right PICA or like AICA origins are not identified. Basilar artery is patent but there is question of nonocclusive clot proximally. Noncalcified plaque at the right ICA origin causes 65% stenosis. Mixed plaque at the left ICA origin causes less than 50% stenosis. Diffuse mild narrowing of the intracranial left vertebral artery, which terminates as a PICA. Perfusion imaging demonstrates no evidence of core infarction or penumbra, but there is limited evaluation of the posterior fossa. Initial results were provided by telephone at the time of interpretation on 08/29/2021 at 10:20 am to provider [facility] , who verbally acknowledged these results. Electronically Signed   By: Guadlupe Spanish M.D.   On: 08/29/2021 10:44    Procedures .Critical Care Performed by: Cheryll Cockayne, MD Authorized by: Cheryll Cockayne, MD   Critical care provider statement:  Critical care time (minutes):  40   Critical care time was exclusive of:  Separately billable procedures and treating other patients and teaching time   Critical care was necessary to treat or prevent imminent or life-threatening deterioration of the following conditions:  CNS failure or compromise   Medications Ordered in ED Medications  sodium chloride flush (NS) 0.9 % injection 3 mL (has no administration in time range)    stroke: mapping our early stages of recovery book (has no administration in time range)  0.9 %  sodium chloride infusion (has no administration in time range)  acetaminophen (TYLENOL) tablet 650 mg (has no administration in time range)    Or  acetaminophen (TYLENOL) 160 MG/5ML solution 650 mg (has no administration in time range)    Or  acetaminophen (TYLENOL) suppository 650 mg (has no administration in time range)  senna-docusate (Senokot-S) tablet 1 tablet (has no administration in time range)  iohexol (OMNIPAQUE) 350 MG/ML injection 100 mL (100 mLs Intravenous Contrast Given 08/29/21 1020)    ED Course  I have reviewed the triage vital signs and the nursing notes.  Pertinent labs & imaging results that were available during my care of the patient were reviewed by me and considered in my medical decision making (see chart for details).    MDM Rules/Calculators/A&P                           Patient not a candidate for tPA given time of onset is 2 to 3 days ago.  He was seen by stroke team and stroke activation was done prior to arrival.  CT imaging concerning for cerebellar stroke.  Will be admitted to the ICU.  Final Clinical Impression(s) / ED Diagnoses Final diagnoses:  Acute ischemic stroke [facility])    Rx / DC Orders ED Discharge Orders     None        Cheryll Cockayne, MD 08/29/21 1106

## 2021-08-29 NOTE — Progress Notes (Addendum)
Pt nauseous and vomited while preparing for MRI.  SBP also elevated to 183.  Notified Dr. Wilford Corner of all of this.  OK with SBP.  New order for zofran.   Neuro status unchanged.

## 2021-08-30 ENCOUNTER — Encounter (HOSPITAL_COMMUNITY)
Admission: EM | Disposition: A | Discharge status: Rehabilitation Facility | Payer: Self-pay | Source: Home / Self Care | Attending: Neurology

## 2021-08-30 ENCOUNTER — Encounter (HOSPITAL_COMMUNITY): Payer: Self-pay | Admitting: Student in an Organized Health Care Education/Training Program

## 2021-08-30 ENCOUNTER — Other Ambulatory Visit: Payer: Self-pay | Admitting: Physician Assistant

## 2021-08-30 ENCOUNTER — Other Ambulatory Visit: Payer: Self-pay

## 2021-08-30 ENCOUNTER — Inpatient Hospital Stay (HOSPITAL_COMMUNITY): Payer: 59

## 2021-08-30 ENCOUNTER — Inpatient Hospital Stay (HOSPITAL_COMMUNITY): Payer: 59 | Admitting: Certified Registered"

## 2021-08-30 DIAGNOSIS — Q2112 Patent foramen ovale: Secondary | ICD-10-CM

## 2021-08-30 DIAGNOSIS — I639 Cerebral infarction, unspecified: Secondary | ICD-10-CM | POA: Diagnosis not present

## 2021-08-30 DIAGNOSIS — I651 Occlusion and stenosis of basilar artery: Secondary | ICD-10-CM | POA: Diagnosis not present

## 2021-08-30 DIAGNOSIS — I6340 Cerebral infarction due to embolism of unspecified cerebral artery: Secondary | ICD-10-CM | POA: Diagnosis not present

## 2021-08-30 DIAGNOSIS — I6322 Cerebral infarction due to unspecified occlusion or stenosis of basilar arteries: Secondary | ICD-10-CM | POA: Diagnosis present

## 2021-08-30 HISTORY — PX: RADIOLOGY WITH ANESTHESIA: SHX6223

## 2021-08-30 HISTORY — PX: IR INTRA CRAN STENT: IMG2345

## 2021-08-30 HISTORY — PX: IR US GUIDE VASC ACCESS RIGHT: IMG2390

## 2021-08-30 HISTORY — PX: IR ANGIO VERTEBRAL SEL SUBCLAVIAN INNOMINATE UNI R MOD SED: IMG5365

## 2021-08-30 HISTORY — PX: IR CT HEAD LTD: IMG2386

## 2021-08-30 HISTORY — PX: IR ANGIO VERTEBRAL SEL SUBCLAVIAN INNOMINATE UNI L MOD SED: IMG5364

## 2021-08-30 LAB — POCT ACTIVATED CLOTTING TIME
Activated Clotting Time: 214 seconds
Activated Clotting Time: 231 seconds

## 2021-08-30 LAB — RAPID URINE DRUG SCREEN, HOSP PERFORMED
Amphetamines: NOT DETECTED
Barbiturates: NOT DETECTED
Benzodiazepines: NOT DETECTED
Cocaine: NOT DETECTED
Opiates: NOT DETECTED
Tetrahydrocannabinol: POSITIVE — AB

## 2021-08-30 LAB — ECHOCARDIOGRAM COMPLETE
AR max vel: 2.94 cm2
AV Peak grad: 9.4 mmHg
Ao pk vel: 1.53 m/s
Area-P 1/2: 3.31 cm2
Calc EF: 57.1 %
Height: 72 in
S' Lateral: 2.8 cm
Single Plane A2C EF: 55.3 %
Single Plane A4C EF: 58.5 %
Weight: 3245.17 oz

## 2021-08-30 LAB — LIPID PANEL
Cholesterol: 288 mg/dL — ABNORMAL HIGH (ref 0–200)
HDL: 52 mg/dL (ref >40)
LDL Cholesterol: 220 mg/dL — ABNORMAL HIGH (ref 0–99)
Total CHOL/HDL Ratio: 5.5 RATIO
Triglycerides: 82 mg/dL (ref <150)
VLDL: 16 mg/dL (ref 0–40)

## 2021-08-30 LAB — GLUCOSE, CAPILLARY
Glucose-Capillary: 135 mg/dL — ABNORMAL HIGH (ref 70–99)
Glucose-Capillary: 140 mg/dL — ABNORMAL HIGH (ref 70–99)
Glucose-Capillary: 142 mg/dL — ABNORMAL HIGH (ref 70–99)
Glucose-Capillary: 111 mg/dL — ABNORMAL HIGH (ref 70–99)
Glucose-Capillary: 164 mg/dL — ABNORMAL HIGH (ref 70–99)
Glucose-Capillary: 127 mg/dL — ABNORMAL HIGH (ref 70–99)
Glucose-Capillary: 118 mg/dL — ABNORMAL HIGH (ref 70–99)

## 2021-08-30 LAB — BASIC METABOLIC PANEL
Anion gap: 9 (ref 5–15)
BUN: 7 mg/dL — ABNORMAL LOW (ref 8–23)
CO2: 22 mmol/L (ref 22–32)
Calcium: 8.9 mg/dL (ref 8.9–10.3)
Chloride: 103 mmol/L (ref 98–111)
Creatinine, Ser: 0.74 mg/dL (ref 0.61–1.24)
GFR, Estimated: >60 mL/min (ref >60)
Glucose, Bld: 128 mg/dL — ABNORMAL HIGH (ref 70–99)
Potassium: 3.3 mmol/L — ABNORMAL LOW (ref 3.5–5.1)
Sodium: 134 mmol/L — ABNORMAL LOW (ref 135–145)

## 2021-08-30 LAB — PROTIME-INR
INR: 1 (ref 0.8–1.2)
Prothrombin Time: 13.3 seconds (ref 11.4–15.2)

## 2021-08-30 LAB — HEPARIN LEVEL (UNFRACTIONATED)
Heparin Unfractionated: 0.35 IU/mL (ref 0.30–0.70)
Heparin Unfractionated: 0.45 IU/mL (ref 0.30–0.70)

## 2021-08-30 SURGERY — IR WITH ANESTHESIA
Anesthesia: Monitor Anesthesia Care

## 2021-08-30 MED ORDER — PHENYLEPHRINE 40 MCG/ML (10ML) SYRINGE FOR IV PUSH (FOR BLOOD PRESSURE SUPPORT)
PREFILLED_SYRINGE | INTRAVENOUS | Status: DC | PRN
  Administered 2021-08-30: 80 ug via INTRAVENOUS

## 2021-08-30 MED ORDER — CLEVIDIPINE BUTYRATE 0.5 MG/ML IV EMUL: 0.0000 mg/h | INTRAVENOUS | Status: DC

## 2021-08-30 MED ORDER — SODIUM CHLORIDE 0.9 % IV SOLN: INTRAVENOUS | Status: DC

## 2021-08-30 MED ORDER — SUCCINYLCHOLINE CHLORIDE 200 MG/10ML IV SOSY
PREFILLED_SYRINGE | INTRAVENOUS | Status: DC | PRN
Start: 2021-08-30 — End: 2021-08-30
  Administered 2021-08-30: 80 mg via INTRAVENOUS

## 2021-08-30 MED ORDER — LIDOCAINE 2% (20 MG/ML) 5 ML SYRINGE
INTRAMUSCULAR | Status: DC | PRN
  Administered 2021-08-30: 60 mg via INTRAVENOUS

## 2021-08-30 MED ORDER — VERAPAMIL HCL 2.5 MG/ML IV SOLN: INTRA_ARTERIAL | Status: DC | PRN

## 2021-08-30 MED ORDER — ASPIRIN 81 MG PO CHEW
81.0000 mg | CHEWABLE_TABLET | Freq: Every day | ORAL | Status: DC
  Administered 2021-08-31 – 2021-09-04 (×5): 81 mg via ORAL
  Filled 2021-08-30 (×5): qty 1

## 2021-08-30 MED ORDER — TICAGRELOR 90 MG PO TABS
90.0000 mg | ORAL_TABLET | Freq: Two times a day (BID) | ORAL | Status: DC
  Filled 2021-08-30: qty 1

## 2021-08-30 MED ORDER — SUGAMMADEX SODIUM 200 MG/2ML IV SOLN
INTRAVENOUS | Status: DC | PRN
Start: 2021-08-30 — End: 2021-08-30
  Administered 2021-08-30: 200 mg via INTRAVENOUS

## 2021-08-30 MED ORDER — NITROGLYCERIN 0.2 MG/ML ON CALL CATH LAB
INTRAVENOUS | Status: DC | PRN
  Administered 2021-08-30 (×5): 40 ug via INTRAVENOUS

## 2021-08-30 MED ORDER — ACETAMINOPHEN 325 MG PO TABS
650.0000 mg | ORAL_TABLET | ORAL | Status: DC | PRN
  Administered 2021-08-30 – 2021-09-04 (×6): 650 mg via ORAL
  Filled 2021-08-30 (×6): qty 2

## 2021-08-30 MED ORDER — CLEVIDIPINE BUTYRATE 0.5 MG/ML IV EMUL
INTRAVENOUS | Status: AC
  Filled 2021-08-30: qty 50

## 2021-08-30 MED ORDER — IOHEXOL 300 MG/ML  SOLN
100.0000 mL | Freq: Once | INTRAMUSCULAR | Status: AC | PRN
  Administered 2021-08-30: 60 mL via INTRA_ARTERIAL

## 2021-08-30 MED ORDER — HEPARIN (PORCINE) 25000 UT/250ML-% IV SOLN
500.0000 [IU]/h | INTRAVENOUS | Status: DC
  Administered 2021-08-30: 500 [IU]/h via INTRAVENOUS
  Filled 2021-08-30: qty 250

## 2021-08-30 MED ORDER — HEPARIN (PORCINE) 25000 UT/250ML-% IV SOLN
500.0000 [IU]/h | INTRAVENOUS | Status: DC
  Filled 2021-08-30: qty 250

## 2021-08-30 MED ORDER — INFLUENZA VAC SPLIT QUAD 0.5 ML IM SUSY
0.5000 mL | PREFILLED_SYRINGE | INTRAMUSCULAR | Status: DC
  Filled 2021-08-30: qty 0.5

## 2021-08-30 MED ORDER — EPTIFIBATIDE 20 MG/10ML IV SOLN
INTRAVENOUS | Status: DC | PRN
  Administered 2021-08-30: 1.5 mg via INTRAVENOUS
  Administered 2021-08-30: 1 mg via INTRAVENOUS
  Administered 2021-08-30: 1.5 mg via INTRAVENOUS

## 2021-08-30 MED ORDER — ACETAMINOPHEN 160 MG/5ML PO SOLN: 650.0000 mg | ORAL | Status: DC | PRN

## 2021-08-30 MED ORDER — CHLORHEXIDINE GLUCONATE 0.12 % MT SOLN
OROMUCOSAL | Status: AC
  Filled 2021-08-30: qty 15

## 2021-08-30 MED ORDER — HEPARIN SODIUM (PORCINE) 1000 UNIT/ML IJ SOLN
INTRAMUSCULAR | Status: DC | PRN
  Administered 2021-08-30: 2000 [IU] via INTRAVENOUS

## 2021-08-30 MED ORDER — NITROGLYCERIN 1 MG/10 ML FOR IR/CATH LAB
INTRA_ARTERIAL | Status: DC | PRN
  Administered 2021-08-30: 200 ug via INTRA_ARTERIAL

## 2021-08-30 MED ORDER — HEPARIN SODIUM (PORCINE) 1000 UNIT/ML IJ SOLN
INTRAMUSCULAR | Status: AC
  Filled 2021-08-30: qty 1

## 2021-08-30 MED ORDER — ORAL CARE MOUTH RINSE: 15.0000 mL | Freq: Once | OROMUCOSAL | Status: DC

## 2021-08-30 MED ORDER — CEFAZOLIN SODIUM-DEXTROSE 2-4 GM/100ML-% IV SOLN
INTRAVENOUS | Status: AC
  Filled 2021-08-30: qty 100

## 2021-08-30 MED ORDER — LABETALOL HCL 5 MG/ML IV SOLN
INTRAVENOUS | Status: DC | PRN
  Administered 2021-08-30: 10 mg via INTRAVENOUS

## 2021-08-30 MED ORDER — VERAPAMIL HCL 2.5 MG/ML IV SOLN
INTRAVENOUS | Status: AC
  Filled 2021-08-30: qty 2

## 2021-08-30 MED ORDER — CHLORHEXIDINE GLUCONATE CLOTH 2 % EX PADS
6.0000 | MEDICATED_PAD | Freq: Every day | CUTANEOUS | Status: DC
  Administered 2021-08-30 – 2021-09-04 (×5): 6 via TOPICAL

## 2021-08-30 MED ORDER — FENTANYL CITRATE (PF) 100 MCG/2ML IJ SOLN
INTRAMUSCULAR | Status: DC | PRN
  Administered 2021-08-30 (×4): 25 ug via INTRAVENOUS

## 2021-08-30 MED ORDER — PHENYLEPHRINE HCL-NACL 20-0.9 MG/250ML-% IV SOLN
INTRAVENOUS | Status: DC | PRN
  Administered 2021-08-30: 20 ug/min via INTRAVENOUS

## 2021-08-30 MED ORDER — ACETAMINOPHEN 650 MG RE SUPP: 650.0000 mg | RECTAL | Status: DC | PRN

## 2021-08-30 MED ORDER — ONDANSETRON HCL 4 MG/2ML IJ SOLN
INTRAMUSCULAR | Status: DC | PRN
  Administered 2021-08-30: 4 mg via INTRAVENOUS

## 2021-08-30 MED ORDER — CLEVIDIPINE BUTYRATE 0.5 MG/ML IV EMUL
0.0000 mg/h | INTRAVENOUS | Status: AC
  Administered 2021-08-30 (×6): 32 mg/h via INTRAVENOUS
  Administered 2021-08-31: 28 mg/h via INTRAVENOUS
  Administered 2021-08-31 (×6): 32 mg/h via INTRAVENOUS
  Filled 2021-08-30 (×2): qty 100
  Filled 2021-08-30: qty 50
  Filled 2021-08-30: qty 100
  Filled 2021-08-30: qty 200
  Filled 2021-08-30 (×5): qty 100
  Filled 2021-08-30: qty 50
  Filled 2021-08-30: qty 100
  Filled 2021-08-30: qty 50
  Filled 2021-08-30: qty 100

## 2021-08-30 MED ORDER — CEFAZOLIN SODIUM-DEXTROSE 2-3 GM-%(50ML) IV SOLR
INTRAVENOUS | Status: DC | PRN
  Administered 2021-08-30: 2 g via INTRAVENOUS

## 2021-08-30 MED ORDER — ASPIRIN 81 MG PO CHEW
81.0000 mg | CHEWABLE_TABLET | Freq: Every day | ORAL | Status: DC
  Filled 2021-08-30: qty 1

## 2021-08-30 MED ORDER — CHLORHEXIDINE GLUCONATE 0.12 % MT SOLN: 15.0000 mL | Freq: Once | OROMUCOSAL | Status: DC

## 2021-08-30 MED ORDER — TICAGRELOR 90 MG PO TABS
90.0000 mg | ORAL_TABLET | Freq: Two times a day (BID) | ORAL | Status: DC
  Administered 2021-08-30 – 2021-09-04 (×10): 90 mg via ORAL
  Filled 2021-08-30 (×10): qty 1

## 2021-08-30 MED ORDER — LIDOCAINE HCL 1 % IJ SOLN
INTRAMUSCULAR | Status: AC
  Administered 2021-08-30: 1 mL via SUBCUTANEOUS
  Filled 2021-08-30: qty 20

## 2021-08-30 MED ORDER — IOHEXOL 300 MG/ML  SOLN
100.0000 mL | Freq: Once | INTRAMUSCULAR | Status: AC | PRN
  Administered 2021-08-30: 70 mL via INTRA_ARTERIAL

## 2021-08-30 MED ORDER — MIDAZOLAM HCL 5 MG/5ML IJ SOLN
INTRAMUSCULAR | Status: DC | PRN
  Administered 2021-08-30: 1 mg via INTRAVENOUS
  Administered 2021-08-30: .5 mg via INTRAVENOUS

## 2021-08-30 MED ORDER — NITROGLYCERIN 1 MG/10 ML FOR IR/CATH LAB
INTRA_ARTERIAL | Status: AC
  Filled 2021-08-30: qty 20

## 2021-08-30 MED ORDER — IOHEXOL 300 MG/ML  SOLN
100.0000 mL | Freq: Once | INTRAMUSCULAR | Status: AC | PRN
  Administered 2021-08-30: 50 mL via INTRA_ARTERIAL

## 2021-08-30 MED ORDER — PROPOFOL 10 MG/ML IV BOLUS
INTRAVENOUS | Status: DC | PRN
  Administered 2021-08-30: 150 mg via INTRAVENOUS

## 2021-08-30 MED ORDER — EPTIFIBATIDE 20 MG/10ML IV SOLN
INTRAVENOUS | Status: AC
  Filled 2021-08-30: qty 10

## 2021-08-30 MED ORDER — ROCURONIUM BROMIDE 10 MG/ML (PF) SYRINGE
PREFILLED_SYRINGE | INTRAVENOUS | Status: DC | PRN
  Administered 2021-08-30: 40 mg via INTRAVENOUS

## 2021-08-30 MED ORDER — GLYCOPYRROLATE 0.2 MG/ML IJ SOLN
INTRAMUSCULAR | Status: DC | PRN
  Administered 2021-08-30: .1 mg via INTRAVENOUS

## 2021-08-30 NOTE — Progress Notes (Signed)
ANTICOAGULATION CONSULT NOTE  Pharmacy Consult for IV Heparin Indication: stroke  Allergies  Allergen Reactions   Duloxetine Tinitus    Other reaction(s): Other (See Comments) Dizziness Dizziness, tinnitus     Patient Measurements: Height: 6' (182.9 cm) Weight: 92 kg (202 lb 13.2 oz) IBW/kg (Calculated) : 77.6 Height: 72 inches Heparin Dosing Weight: 92 kg  Vital Signs: Temp: 97.4 F (36.3 C) (11/03 0800) Temp Source: Axillary (11/03 0800) BP: 170/96 (11/03 1100) Pulse Rate: 77 (11/03 1100)  Labs: Recent Labs    08/29/21 0932 08/29/21 0934 08/29/21 1646 08/29/21 2340 08/30/21 0412 08/30/21 0850  HGB 13.7 14.3  --  14.6  --   --   HCT 41.0 42.0  --  43.7  --   --   PLT 378  --   --  284  --   --   APTT 28  --   --   --   --   --   LABPROT 13.0  --   --  13.3  --   --   INR 1.0  --   --  1.0  --   --   HEPARINUNFRC  --   --  0.17* 0.35  --  0.45  CREATININE 1.03 0.90  --   --  0.74  --     Estimated Creatinine Clearance: 103.7 mL/min (by C-G formula based on SCr of 0.74 mg/dL).  Medical History: Past Medical History:  Diagnosis Date   Hypertension    PAD (peripheral artery disease) [facility])     Assessment: 65 yr old man presented as code stroke; he had hx of dizziness/HA on and off since 10/31-11/1, no intervention, CT head with cerebellar infarcts. Pt was no on anticoagulation PTA. Pharmacy consulted to start heparin using stroke protocol (target low end goal and no bolus).    Confirmatory heparin level remains therapeutic this morning.   Pt is tentatively scheduled for image-guided diagnosis cerebral arteriogram on 08/30/21. Pt was loaded on Brilinta 11/1. Also on aspirin.  Goal of Therapy:  Heparin level 0.3-0.5 units/ml Monitor platelets by anticoagulation protocol: Yes   Plan:   Continue heparin at 1300 units/hr  Monitor daily heparin level and CBC, s/sx bleeding  F/u plan post-neuro IR procedure 11/3 Neuro planning asa/plavix x 90 days   Leia Alf, PharmD, BCPS Please check AMION for all [facility] contact numbers Clinical Pharmacist 08/30/2021 11:26 AM

## 2021-08-30 NOTE — Consult Note (Signed)
HPI:  The patient has had a H&P performed within the last 30 days, all history, medications, and exam have been reviewed. The patient denies any interval changes since the H&P.  Medications: Prior to Admission medications   Medication Sig Start Date End Date Taking? Authorizing Provider  Acetylcysteine (NAC PO) Take 1 tablet by mouth daily.   Yes [provider]  aspirin EC 81 MG tablet Take 81 mg by mouth daily. Swallow whole.   Yes [provider]  Cyanocobalamin (B-12 PO) Take 1 tablet by mouth daily.   Yes [provider]  ibuprofen (ADVIL) 200 MG tablet Take 400 mg by mouth every 6 (six) hours as needed for fever, headache or mild pain.   Yes [provider]  OVER THE COUNTER MEDICATION Take 1 tablet by mouth daily. Delta 3 gummie   Yes [provider]     Vital Signs: BP (!) 161/93   Pulse 82   Temp (!) 97.4 F (36.3 C) (Axillary)   Resp 16   Ht 6' (1.829 m)   Wt 202 lb 13.2 oz (92 kg)   SpO2 96%   BMI 27.51 kg/m   Physical Exam Vitals reviewed.  Constitutional:      General: He is not in acute distress. HENT:     Head: Normocephalic and atraumatic.  Eyes:     Comments: Eye patch on left   Cardiovascular:     Rate and Rhythm: Normal rate and regular rhythm.  Pulmonary:     Effort: Pulmonary effort is normal.  Abdominal:     General: Abdomen is flat.     Palpations: Abdomen is soft.  Skin:    General: Skin is warm and dry.     Coloration: Skin is not jaundiced or pale.  Neurological:     Mental Status: He is alert and oriented to person, place, and time.  Psychiatric:        Mood and Affect: Mood normal.        Behavior: Behavior normal.        Judgment: Judgment normal.    Mallampati Score:  MD Evaluation Airway: WNL Heart: WNL Abdomen: WNL Chest/ Lungs: WNL ASA  Classification: 3 Mallampati/Airway Score: Two  Labs:  CBC: Recent Labs    08/29/21 0932 08/29/21 0934 08/29/21 2340  WBC 11.0*  --   12.2*  HGB 13.7 14.3 14.6  HCT 41.0 42.0 43.7  PLT 378  --  284    COAGS: Recent Labs    08/29/21 0932 08/29/21 2340  INR 1.0 1.0  APTT 28  --     BMP: Recent Labs    08/29/21 0932 08/29/21 0934 08/30/21 0412  NA 136 139 134*  K 3.6 3.5 3.3*  CL 108 107 103  CO2 17*  --  22  GLUCOSE 183* 188* 128*  BUN 10 10 7*  CALCIUM 9.0  --  8.9  CREATININE 1.03 0.90 0.74  GFRNONAA >60  --  >60    LIVER FUNCTION TESTS: Recent Labs    08/29/21 0932  BILITOT 0.8  AST 22  ALT 15  ALKPHOS 59  PROT 6.4*  ALBUMIN 3.7    Assessment/Plan:  65 yo male with 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   NIR was requested for cerebral angiogram with intervention.  Case was reviewed and approved by Dr. Corliss Skains, patient is scheduled for the procedure today.   VS hypertensive but stable 161/93  CBC, INR, RF stable  COVID  negative U drug test Positive for [facility]  Patient received loading dose of Brilinta 180 mg yesterday at 1656 hrs, received Brilinta 90 mg and ASA 81 mg this morning at 0817 hrs   Risks and benefits of cerebral angiogram with intervention were discussed with the patient including, but not limited to bleeding, infection, vascular injury, contrast induced renal failure, stroke or even death.  This interventional procedure involves the use of X-rays and because of the nature of the planned procedure, it is possible that we will have prolonged use of X-Edwards fluoroscopy.  Potential radiation risks to you include (but are not limited to) the following: - A slightly elevated risk for cancer  several years later in life. This risk is typically less than 0.5% percent. This risk is low in comparison to the normal incidence of human cancer, which is 33% for women and 50% for men  according to the [employer]. - Radiation induced injury can include skin redness, resembling a rash, tissue breakdown / ulcers and hair loss (which can be temporary or permanent).   The likelihood of either of these occurring depends on the difficulty of the procedure and whether you are sensitive to radiation due to previous procedures, disease, or genetic conditions.   IF your procedure requires a prolonged use of radiation, you will be notified and given written instructions for further action.  It is your responsibility to monitor the irradiated area for the 2 weeks following the procedure and to notify your physician if you are concerned that you have suffered a radiation induced injury.    All of the patient's questions were answered, patient is agreeable to proceed.  Consent signed and in chart.  Signed: Willette Brace 08/30/2021, 8:52 AM

## 2021-08-30 NOTE — Anesthesia Preprocedure Evaluation (Addendum)
Anesthesia Evaluation  Patient identified by MRN, date of birth, ID bandGeneral Assessment Comment:Patient awake  Reviewed: Allergy & Precautions, NPO status , Patient's Chart, lab work & pertinent test results  Airway Mallampati: III  TM Distance: >3 FB Neck ROM: Full    Dental no notable dental hx.    Pulmonary former smoker,    Pulmonary exam normal breath sounds clear to auscultation       Cardiovascular hypertension, + Peripheral Vascular Disease  Normal cardiovascular exam Rhythm:Regular Rate:Normal  ECG: SR, rate 89   Neuro/Psych CVA, Residual Symptoms negative psych ROS   GI/Hepatic negative GI ROS, Neg liver ROS,   Endo/Other  negative endocrine ROS  Renal/GU negative Renal ROS     Musculoskeletal negative musculoskeletal ROS (+)   Abdominal   Peds  Hematology negative hematology ROS (+)   Anesthesia Other Findings Stenosis   Reproductive/Obstetrics                           Anesthesia Physical Anesthesia Plan  ASA: 3  Anesthesia Plan: MAC   Post-op Pain Management:    Induction: Intravenous  PONV Risk Score and Plan: 1 and Treatment may vary due to age or medical condition and Ondansetron  Airway Management Planned:   Additional Equipment: Arterial line  Intra-op Plan:   Post-operative Plan:   Informed Consent: I have reviewed the patients History and Physical, chart, labs and discussed the procedure including the risks, benefits and alternatives for the proposed anesthesia with the patient or authorized representative who has indicated his/her understanding and acceptance.     Dental advisory given  Plan Discussed with: CRNA  Anesthesia Plan Comments:         Anesthesia Quick Evaluation

## 2021-08-30 NOTE — Anesthesia Procedure Notes (Signed)
Procedure Name: Intubation Date/Time: 08/30/2021 1:16 PM Performed by: Wilburn Cornelia, CRNA Pre-anesthesia Checklist: Patient identified, Emergency Drugs available, Suction available, Patient being monitored and Timeout performed Patient Re-evaluated:Patient Re-evaluated prior to induction Oxygen Delivery Method: Circle system utilized Preoxygenation: Pre-oxygenation with 100% oxygen Induction Type: IV induction Ventilation: Mask ventilation without difficulty Laryngoscope Size: Mac and 4 Grade View: Grade IV Tube type: Oral Tube size: 7.5 mm Number of attempts: 1 Airway Equipment and Method: Stylet Placement Confirmation: positive ETCO2, ETT inserted through vocal cords under direct vision, CO2 detector and breath sounds checked- equal and bilateral Secured at: 24 cm Tube secured with: Tape Dental Injury: Teeth and Oropharynx as per pre-operative assessment

## 2021-08-30 NOTE — Progress Notes (Signed)
Dr. Corliss Skains spoke with patient and patient's wife about IR procedure to be done.  The plan is to proceed with the procedure tomorrow mid morning unless neuro status changes throughout the night requiring emergent procedure to be done.  Patient and his wife, Maurice Little verbalized understanding of all of this.  MRI results also gone over with family. (See results).  All questions answered at this time.  RN to give 180mg  Brilinta loading dose now and 81mg  aspirin.  Patient passed stroke swallow screen without difficulty.  Will watch neuro exam very closely.

## 2021-08-30 NOTE — Progress Notes (Signed)
SLP Cancellation Note  Patient Details Name: Maurice Little MRN: [id number] DOB: November 26, 1955   Cancelled treatment:       Reason Eval/Treat Not Completed: Medical issues which prohibited therapy; Pt with tentative IR procedure this date. Will follow up for speech language evaluation subsequent date.   Ardyth Gal MA, CCC-SLP Acute Rehabilitation Services   08/30/2021, 9:02 AM

## 2021-08-30 NOTE — Progress Notes (Signed)
    [facility] has been requested to perform a transesophageal echocardiogram on 11/04 for CVA.  After careful review of history and examination, the risks and benefits of transesophageal echocardiogram have been explained including risks of esophageal damage, perforation (1:10,000 risk), bleeding, pharyngeal hematoma as well as other potential complications associated with conscious sedation including aspiration, arrhythmia, respiratory failure and death. Alternatives to treatment were discussed, questions were answered. Patient wife is present as well, they are willing to proceed.   Theodore Demark, PA-C 08/30/2021 8:00 PM

## 2021-08-30 NOTE — Procedures (Addendum)
S/P 4 vessel cerebral arteriograms followed by stent assisted angioplasty of occluded dominant RT VA origin and midbasilar artery prominent stenosis. RT rad approach. Wrist band applied for hemostasis. Post CT NO ICH or hydrocephalus. Extubated  Obeying simple instructions appropriately. Pupils 32mm Rt = LT RT facial droop. Tongue midline. Moves all 4s spontaneously and to command. RT rad pulse intact.   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 S.Winter Trefz MD

## 2021-08-30 NOTE — Progress Notes (Addendum)
1130 Received pt A&O x4. BP 190/96, Cleviprex infusion rate was increased by Janelle Floor ICU RN to 10mg /hr. Will cont to monitor BP. 1205 Time out and hand off was given to .

## 2021-08-30 NOTE — Progress Notes (Signed)
STROKE TEAM PROGRESS NOTE   SUBJECTIVE (INTERVAL HISTORY) His wife is at the bedside.  Pt lying in bed, left eye in eye patch.  Patient reported dizziness, left-sided numbness, diplopia at the time onset, however overnight he also developed right facial droop, and right hearing loss, slurred speech and nausea.  Denies any arm or leg weakness.  Still on heparin IV, aspirin and Brilinta.  Pending cerebral angiogram this afternoon.  Discussed with patient and wife, will do TEE tomorrow.   OBJECTIVE Temp:  [97.4 F (36.3 C)-99.2 F (37.3 C)] 98.5 F (36.9 C) (11/03 1143) Pulse Rate:  [69-97] 80 (11/03 1615) Cardiac Rhythm: Normal sinus rhythm (11/03 0800) Resp:  [13-21] 19 (11/03 1615) BP: (149-190)/(86-106) 190/96 (11/03 1139) SpO2:  [95 %-98 %] 95 % (11/03 1615) Arterial Line BP: (155)/(66) 155/66 (11/03 1615) Weight:  [92 kg] 92 kg (11/02 1745)  Recent Labs  Lab 08/29/21 2339 08/30/21 0227 08/30/21 0341 08/30/21 0752 08/30/21 1110  GLUCAP 143* 135* 140* 142* 111*   Recent Labs  Lab 08/29/21 0932 08/29/21 0934 08/30/21 0412  NA 136 139 134*  K 3.6 3.5 3.3*  CL 108 107 103  CO2 17*  --  22  GLUCOSE 183* 188* 128*  BUN 10 10 7*  CREATININE 1.03 0.90 0.74  CALCIUM 9.0  --  8.9   Recent Labs  Lab 08/29/21 0932  AST 22  ALT 15  ALKPHOS 59  BILITOT 0.8  PROT 6.4*  ALBUMIN 3.7   Recent Labs  Lab 08/29/21 0932 08/29/21 0934 08/29/21 2340  WBC 11.0*  --  12.2*  NEUTROABS 5.0  --   --   HGB 13.7 14.3 14.6  HCT 41.0 42.0 43.7  MCV 94.9  --  95.6  PLT 378  --  284   No results for input(s): CKTOTAL, CKMB, CKMBINDEX, TROPONINI in the last 168 hours. Recent Labs    08/29/21 0932 08/29/21 2340  LABPROT 13.0 13.3  INR 1.0 1.0   No results for input(s): COLORURINE, LABSPEC, PHURINE, GLUCOSEU, HGBUR, BILIRUBINUR, KETONESUR, PROTEINUR, UROBILINOGEN, NITRITE, LEUKOCYTESUR in the last 72 hours.  Invalid input(s): APPERANCEUR     Component Value Date/Time   CHOL  288 (H) 08/29/2021 2340   TRIG 82 08/29/2021 2340   HDL 52 08/29/2021 2340   CHOLHDL 5.5 08/29/2021 2340   VLDL 16 08/29/2021 2340   LDLCALC 220 (H) 08/29/2021 2340   Lab Results  Component Value Date   HGBA1C 5.5 08/29/2021      Component Value Date/Time   LABOPIA NONE DETECTED 08/30/2021 0751   COCAINSCRNUR NONE DETECTED 08/30/2021 0751   LABBENZ NONE DETECTED 08/30/2021 0751   AMPHETMU NONE DETECTED 08/30/2021 0751   THCU POSITIVE (A) 08/30/2021 0751   LABBARB NONE DETECTED 08/30/2021 0751    No results for input(s): ETH in the last 168 hours.  I have personally reviewed the radiological images below and agree with the radiology interpretations.  MR BRAIN WO CONTRAST  Result Date: 08/29/2021 CLINICAL DATA:  Stroke, follow up EXAM: MRI HEAD WITHOUT CONTRAST TECHNIQUE: Multiplanar, multiecho pulse sequences of the brain and surrounding structures were obtained without intravenous contrast. COMPARISON:  None. FINDINGS: Brain: Reduced diffusion is present in the inferior right cerebellum. Additional small foci of involvement in the right brachium pontis and right pontomedullary junction. No significant mass effect. No evidence of intracranial hemorrhage. Patchy foci of T2 hyperintensity in the supratentorial white matter are nonspecific but may reflect mild chronic microvascular ischemic changes. Ventricles and sulci are within normal  limits in size and configuration. Vascular: Major vessel flow voids at the skull base are preserved. Skull and upper cervical spine: Normal marrow signal is preserved. Sinuses/Orbits: Paranasal sinus mucosal thickening. Orbits are unremarkable. Other: Sella is unremarkable.  Mastoid air cells are clear. 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 Small additional involvement of right brachium pontis and right pontomedullary junction. No hemorrhage or significant mass effect. Mild chronic microvascular ischemic changes. Electronically Signed    By: Guadlupe Spanish M.D.   On: 08/29/2021 13:03   CT CEREBRAL PERFUSION W CONTRAST  Result Date: 08/29/2021 CLINICAL DATA:  Neuro deficit, acute, stroke suspected EXAM: CT ANGIOGRAPHY HEAD AND NECK CT PERFUSION BRAIN TECHNIQUE: Multidetector CT imaging of the head and neck was performed using the standard protocol during bolus administration of intravenous contrast. Multiplanar CT image reconstructions and MIPs were obtained to evaluate the vascular anatomy. Carotid stenosis measurements (when applicable) are obtained utilizing NASCET criteria, using the distal internal carotid diameter as the denominator. Multiphase CT imaging of the brain was performed following IV bolus contrast injection. Subsequent parametric perfusion maps were calculated using RAPID software. CONTRAST:  100 mL Omnipaque 350 COMPARISON:  None. FINDINGS: CTA NECK Aortic arch: Great vessel origins are patent. Right carotid system: Patent. Noncalcified plaque at the ICA origin causes 65% stenosis. Left carotid system: Patent. Mixed plaque at the ICA origin causes less than 50% stenosis. Vertebral arteries: Left vertebral artery is patent. Absent opacification of the proximal right vertebral artery at the origin with minimal reconstitution of the V1 segment followed by reocclusion. The V2 segment is occluded proximally with reconstitution at the C4 level. Skeleton: Mild cervical spine degenerative changes, greatest at C6-C7. Other neck: Unremarkable. Upper chest: No apical lung mass. Review of the MIP images confirms the above findings CTA HEAD Anterior circulation: Intracranial internal carotid arteries are patent with mild calcified plaque. Anterior and middle cerebral arteries are patent. Posterior circulation: There is decreased caliber of the intracranial left vertebral artery throughout its course. Appears to terminate at patent left PICA. Intracranial right vertebral artery is patent. Basilar is patent but there is low density within the  lumen proximally. Superior cerebellar artery origins are patent. Bilateral posterior communicating arteries are present. Posterior cerebral arteries are patent. Venous sinuses: As permitted by contrast timing, patent. Review of the MIP images confirms the above findings CT Brain Perfusion Findings: CBF (<30%) Volume: 55mL Perfusion (Tmax>6.0s) volume: 69mL Mismatch Volume: 41mL Infarction Location: None. IMPRESSION: Abnormal extracranial right vertebral artery with limited opacification proximally and reconstitution at the C4 level. May reflect high-grade origin stenosis. Right PICA or like AICA origins are not identified. Basilar artery is patent but there is question of nonocclusive clot proximally. Noncalcified plaque at the right ICA origin causes 65% stenosis. Mixed plaque at the left ICA origin causes less than 50% stenosis. Diffuse mild narrowing of the intracranial left vertebral artery, which terminates as a PICA. Perfusion imaging demonstrates no evidence of core infarction or penumbra, but there is limited evaluation of the posterior fossa. Initial results were provided by telephone at the time of interpretation on 08/29/2021 at 10:20 am to provider [facility] , who verbally acknowledged these results. Electronically Signed   By: Guadlupe Spanish M.D.   On: 08/29/2021 10:44   ECHOCARDIOGRAM COMPLETE  Result Date: 08/30/2021    ECHOCARDIOGRAM REPORT   Patient Name:   DEJON Dietze Date of Exam: 08/30/2021 Medical Rec #:  [id number]  Height:       72.0 in Accession #:  [id number] Weight:       202.8 lb Date of Birth:  10/21/1956   BSA:          2.143 m Patient Age:    65 years   BP:           155/93 mmHg Patient Gender: M          HR:           76 bpm. Exam Location:  Inpatient Procedure: 2D Echo, Color Doppler and Cardiac Doppler Indications:    Eval for PFO  History:        Patient has no prior history of Echocardiogram examinations.  Sonographer:    Cleatis Polka Referring Phys: [id number] KRISTI KEHOE  IMPRESSIONS  1. Left ventricular ejection fraction, by estimation, is 60 to 65%. The left ventricle has normal function. The left ventricle has no regional wall motion abnormalities. Left ventricular diastolic parameters are consistent with Grade II diastolic dysfunction (pseudonormalization).  2. Right ventricular systolic function is normal. The right ventricular size is normal. Tricuspid regurgitation signal is inadequate for assessing PA pressure.  3. The mitral valve is normal in structure. No evidence of mitral valve regurgitation. No evidence of mitral stenosis.  4. The aortic valve is tricuspid. Aortic valve regurgitation is not visualized. Mild aortic valve sclerosis is present, with no evidence of aortic valve stenosis.  5. The inferior vena cava is normal in size with greater than 50% respiratory variability, suggesting right atrial pressure of 3 mmHg. FINDINGS  Left Ventricle: Left ventricular ejection fraction, by estimation, is 60 to 65%. The left ventricle has normal function. The left ventricle has no regional wall motion abnormalities. The left ventricular internal cavity size was normal in size. There is  no left ventricular hypertrophy. Left ventricular diastolic parameters are consistent with Grade II diastolic dysfunction (pseudonormalization). Right Ventricle: The right ventricular size is normal. No increase in right ventricular wall thickness. Right ventricular systolic function is normal. Tricuspid regurgitation signal is inadequate for assessing PA pressure. Left Atrium: Left atrial size was normal in size. Right Atrium: Right atrial size was normal in size. Pericardium: There is no evidence of pericardial effusion. Mitral Valve: The mitral valve is normal in structure. No evidence of mitral valve regurgitation. No evidence of mitral valve stenosis. Tricuspid Valve: The tricuspid valve is normal in structure. Tricuspid valve regurgitation is not demonstrated. Aortic Valve: The aortic valve  is tricuspid. Aortic valve regurgitation is not visualized. Mild aortic valve sclerosis is present, with no evidence of aortic valve stenosis. Aortic valve peak gradient measures 9.4 mmHg. Pulmonic Valve: The pulmonic valve was normal in structure. Pulmonic valve regurgitation is trivial. Aorta: The aortic root is normal in size and structure. Venous: The inferior vena cava is normal in size with greater than 50% respiratory variability, suggesting right atrial pressure of 3 mmHg. IAS/Shunts: No atrial level shunt detected by color flow Doppler.  LEFT VENTRICLE PLAX 2D LVIDd:         4.40 cm      Diastology LVIDs:         2.80 cm      LV e' medial:    7.07 cm/s LV PW:         1.00 cm      LV E/e' medial:  8.9 LV IVS:        1.00 cm      LV e' lateral:   6.85 cm/s LVOT diam:     2.10 cm  LV E/e' lateral: 9.2 LV SV:         84 LV SV Index:   39 LVOT Area:     3.46 cm  LV Volumes (MOD) LV vol d, MOD A2C: 96.1 ml LV vol d, MOD A4C: 107.0 ml LV vol s, MOD A2C: 43.0 ml LV vol s, MOD A4C: 44.4 ml LV SV MOD A2C:     53.1 ml LV SV MOD A4C:     107.0 ml LV SV MOD BP:      58.1 ml RIGHT VENTRICLE             IVC RV Basal diam:  2.60 cm     IVC diam: 1.70 cm RV Mid diam:    2.50 cm RV S prime:     15.40 cm/s TAPSE (M-mode): 2.5 cm LEFT ATRIUM           Index        RIGHT ATRIUM           Index LA diam:      3.60 cm 1.68 cm/m   RA Area:     14.90 cm LA Vol (A2C): 37.2 ml 17.36 ml/m  RA Volume:   29.30 ml  13.67 ml/m LA Vol (A4C): 50.2 ml 23.42 ml/m  AORTIC VALVE AV Area (Vmax): 2.94 cm AV Vmax:        153.00 cm/s AV Peak Grad:   9.4 mmHg LVOT Vmax:      130.00 cm/s LVOT Vmean:     73.000 cm/s LVOT VTI:       0.243 m  AORTA Ao Root diam: 2.90 cm Ao Asc diam:  3.40 cm MITRAL VALVE MV Area (PHT): 3.31 cm    SHUNTS MV Decel Time: 229 msec    Systemic VTI:  0.24 m MV E velocity: 62.90 cm/s  Systemic Diam: 2.10 cm MV A velocity: 67.00 cm/s MV E/A ratio:  0.94 Dalton McleanMD Electronically signed by Wilfred Lacy  Signature Date/Time: 08/30/2021/2:45:21 PM    Final    CT HEAD CODE STROKE WO CONTRAST  Result Date: 08/29/2021 CLINICAL DATA:  Code stroke.  Left-sided weakness EXAM: CT HEAD WITHOUT CONTRAST TECHNIQUE: Contiguous axial images were obtained from the base of the skull through the vertex without intravenous contrast. COMPARISON:  None. FINDINGS: Brain: There is no acute intracranial hemorrhage, mass effect, or edema. Gray-white differentiation is preserved. There is an age-indeterminate small infarct of the right inferior cerebellum. Ventricles and sulci are normal in size and configuration. No extra-axial collection. Vascular: No hyperdense vessel. Intracranial atherosclerotic calcification at the skull base. Skull: Unremarkable. Sinuses/Orbits: Lobular mucosal thickening. Orbits are unremarkable. Other: Mastoid air cells are clear. ASPECTS [facility] Stroke Program Early CT Score) - Ganglionic level infarction (caudate, lentiform nuclei, internal capsule, insula, M1-M3 cortex): 7 - Supraganglionic infarction (M4-M6 cortex): 3 Total score (0-10 with 10 being normal): 10 IMPRESSION: There is no acute intracranial hemorrhage. ASPECT score is 10. 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 These results were communicated to Dr. Wilford Corner at 9:44 am on 08/29/2021 by text page via the [facility] messaging system. Electronically Signed   By: Guadlupe Spanish M.D.   On: 08/29/2021 09:46   CT ANGIO HEAD NECK W WO CM (CODE STROKE)  Result Date: 08/29/2021 CLINICAL DATA:  Neuro deficit, acute, stroke suspected EXAM: CT ANGIOGRAPHY HEAD AND NECK CT PERFUSION BRAIN TECHNIQUE: Multidetector CT imaging of the head and neck was performed using the standard protocol during bolus administration of intravenous contrast. Multiplanar CT image  reconstructions and MIPs were obtained to evaluate the vascular anatomy. Carotid stenosis measurements (when applicable) are obtained utilizing NASCET criteria, using the distal internal  carotid diameter as the denominator. Multiphase CT imaging of the brain was performed following IV bolus contrast injection. Subsequent parametric perfusion maps were calculated using RAPID software. CONTRAST:  100 mL Omnipaque 350 COMPARISON:  None. FINDINGS: CTA NECK Aortic arch: Great vessel origins are patent. Right carotid system: Patent. Noncalcified plaque at the ICA origin causes 65% stenosis. Left carotid system: Patent. Mixed plaque at the ICA origin causes less than 50% stenosis. Vertebral arteries: Left vertebral artery is patent. Absent opacification of the proximal right vertebral artery at the origin with minimal reconstitution of the V1 segment followed by reocclusion. The V2 segment is occluded proximally with reconstitution at the C4 level. Skeleton: Mild cervical spine degenerative changes, greatest at C6-C7. Other neck: Unremarkable. Upper chest: No apical lung mass. Review of the MIP images confirms the above findings CTA HEAD Anterior circulation: Intracranial internal carotid arteries are patent with mild calcified plaque. Anterior and middle cerebral arteries are patent. Posterior circulation: There is decreased caliber of the intracranial left vertebral artery throughout its course. Appears to terminate at patent left PICA. Intracranial right vertebral artery is patent. Basilar is patent but there is low density within the lumen proximally. Superior cerebellar artery origins are patent. Bilateral posterior communicating arteries are present. Posterior cerebral arteries are patent. Venous sinuses: As permitted by contrast timing, patent. Review of the MIP images confirms the above findings CT Brain Perfusion Findings: CBF (<30%) Volume: 1mL Perfusion (Tmax>6.0s) volume: 56mL Mismatch Volume: 65mL Infarction Location: None. IMPRESSION: Abnormal extracranial right vertebral artery with limited opacification proximally and reconstitution at the C4 level. May reflect high-grade origin stenosis.  Right PICA or like AICA origins are not identified. Basilar artery is patent but there is question of nonocclusive clot proximally. Noncalcified plaque at the right ICA origin causes 65% stenosis. Mixed plaque at the left ICA origin causes less than 50% stenosis. Diffuse mild narrowing of the intracranial left vertebral artery, which terminates as a PICA. Perfusion imaging demonstrates no evidence of core infarction or penumbra, but there is limited evaluation of the posterior fossa. Initial results were provided by telephone at the time of interpretation on 08/29/2021 at 10:20 am to provider [facility] , who verbally acknowledged these results. Electronically Signed   By: Guadlupe Spanish M.D.   On: 08/29/2021 10:44     PHYSICAL EXAM  Temp:  [97.4 F (36.3 C)-99.2 F (37.3 C)] 98.5 F (36.9 C) (11/03 1143) Pulse Rate:  [69-97] 80 (11/03 1615) Resp:  [13-21] 19 (11/03 1615) BP: (149-190)/(86-106) 190/96 (11/03 1139) SpO2:  [95 %-98 %] 95 % (11/03 1615) Arterial Line BP: (155)/(66) 155/66 (11/03 1615) Weight:  [92 kg] 92 kg (11/02 1745)  General - Well nourished, well developed, in acute distress due to dizziness and nausea.  Ophthalmologic - fundi not visualized due to noncooperation.  Cardiovascular - Regular rhythm and rate.  Neuro - lethargic, alert, eyes open on request, orientated to age, place, time and people. No aphasia, fluent language, mild dysarthria, following all simple commands. Able to name and repeat.  Right eye lateral gaze incomplete, left gaze unsustained nystagmus direction to the left, right gaze sustained nystagmus direction to the right, visual field full, left pupil 2.5 mm, right pupil 2 mm under light, brisk to light reaction.  Right facial droop and decreased strength for right eye closure and right raising up forehead.  Right  hearing loss.  Tongue midline. Bilateral UEs 5/5, no drift. Bilaterally LEs 5/5, no drift. Sensation decreased on the left arm and leg in the  face, right finger-to-nose ataxic, left finger-to-nose and bilateral heel-to-shin grossly intact.  Gait not tested.     ASSESSMENT/PLAN [name] is a 65 y.o. male with no known medical history admitted for left-sided numbness, dizziness, headache diplopia.  Developed right facial droop, slurred speech and right hearing loss overnight.  No tPA given due to outside window.    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 Cardioembolic can not be completely ruled out but less likely  Resultant right peripheral facial droop (CN VII), right lateral gaze incomplete (CN VI), right hearing loss (CN VIII and AICA territory), left sided numbness (right spinal lemniscus and right trigeminothalamic tract), right cerebellum (right PICA and AICA) CT showed right cerebellar infarct CT head and neck right VA origin occlusion, reconstituted at distal V2.  Right PICA occlusion, mid basilar artery high-grade stenosis versus thrombosis, right ICA 65% stenosis, left VA ends at PICA MRI right cerebellar infarct, and right lower pontine infarct. IR right VA origin, mid to basilar artery prominent stenosis, status post stenting in both arteries.  Severe 90% stenosis right ICA proximal, 50% stenosis left ICA proximal. 2D Echo EF 60 to 65% LDL 220 HgbA1c 5.5 Heparin IV for VTE prophylaxis No antithrombotic prior to admission, now on aspirin 81 mg daily, Brilinta (ticagrelor) 90 mg bid, and heparin IV.  Patient counseled to be compliant with his antithrombotic medications Ongoing aggressive stroke risk factor management Therapy recommendations: Pending Disposition: Pending  Carotid stenosis, bilateral CTA head and neck showed right ICA 65% stenosis Cerebral angiogram showed right ICA proximal 90% stenosis, left ICA proximal 50% stenosis Dr. Corliss Skains plan for right ICA  stenting in the near future  Hypertension Unstable BP goal 1 20-1 40 after procedure On Cleviprex Long term BP goal normotensive  Hyperlipidemia Home meds: None LDL 220, goal < 70 Now on Lipitor 80 Continue statin at discharge  Other Stroke Risk Factors THC abuse, cessation education provided  Other Active Problems Hypokalemia, K 3.3-supplement Leukocytosis WBC 12.2  Hospital day # 1  This patient is critically ill due to brainstem stroke, cerebellar stroke, vertebral artery and basilar artery occlusion/stenosis and at significant risk of neurological worsening, death form large brainstem stroke. This patient's care requires constant monitoring of vital signs, hemodynamics, respiratory and cardiac monitoring, review of multiple databases, neurological assessment, discussion with family, other specialists and medical decision making of high complexity. I spent 50 minutes of neurocritical care time in the care of this patient. I had long discussion with wife and patient at bedside, updated pt current condition, treatment plan and potential prognosis, and answered all the questions.  They expressed understanding and appreciation.  I also discussed with Dr. Charlynne Pander, MD PhD Stroke Neurology 08/30/2021 4:26 PM    To contact Stroke Continuity provider, please refer to [email or web address]. After hours, contact General Neurology

## 2021-08-30 NOTE — Anesthesia Postprocedure Evaluation (Signed)
Anesthesia Post Note  Patient: Maurice Little  Procedure(s) Performed: IR WITH ANESTHESIA     Patient location during evaluation: ICU Anesthesia Type: General Level of consciousness: awake Pain management: pain level controlled Vital Signs Assessment: post-procedure vital signs reviewed and stable Respiratory status: spontaneous breathing, nonlabored ventilation, respiratory function stable and patient connected to nasal cannula oxygen Cardiovascular status: blood pressure returned to baseline and stable Postop Assessment: no apparent nausea or vomiting Anesthetic complications: no   No notable events documented.  Last Vitals:  Vitals:   08/30/21 1900 08/30/21 2000  BP: 130/66   Pulse: 62   Resp: 16   Temp:  37.1 C  SpO2: 93%     Last Pain:  Vitals:   08/30/21 2000  TempSrc: Oral  PainSc:                  Catheryn Bacon Gerrit Rafalski

## 2021-08-30 NOTE — Progress Notes (Signed)
Per Dr. Roda Shutters, no need for loop recorder, he requested change to 30 day event monitor. I have sent the request to our monitor team and set up 2 month visit to review monitor result.

## 2021-08-30 NOTE — Progress Notes (Signed)
PT Cancellation Note  Patient Details Name: Joshawa Dubin MRN: [id number] DOB: [birth year]   Cancelled Treatment:    Reason Eval/Treat Not Completed: Patient at procedure or test/unavailable - plan for cerebral angiogram with intervention tentatively today, PT to check back when appropriate.  Marye Round, PT DPT Acute Rehabilitation Services Pager [phone]  Office [phone]    Tyrone Apple E Christain [place] 08/30/2021, 9:22 AM

## 2021-08-30 NOTE — Transfer of Care (Signed)
Immediate Anesthesia Transfer of Care Note  Patient: Maurice Little  Procedure(s) Performed: IR WITH ANESTHESIA  Patient Location: ICU  Anesthesia Type:General  Level of Consciousness: drowsy and patient cooperative  Airway & Oxygen Therapy: Patient Spontanous Breathing  Post-op Assessment: Report given to RN and Post -op Vital signs reviewed and stable  Post vital signs: Reviewed and stable  Last Vitals:  Vitals Value Taken Time  BP    Temp    Pulse 79 08/30/21 1619  Resp 20 08/30/21 1619  SpO2 94 % 08/30/21 1619  Vitals shown include unvalidated device data.  Last Pain:  Vitals:   08/30/21 1143  TempSrc: Oral  PainSc:          Complications: No notable events documented.

## 2021-08-30 NOTE — Progress Notes (Signed)
OT Cancellation Note  Patient Details Name: Maurice Little MRN: [id number] DOB: December 15, 1955   Cancelled Treatment:    Reason Eval/Treat Not Completed: Medical issues which prohibited therapy (Patient at procedure or test/unavailable - plan for cerebral angiogram with intervention tentatively today)  Burnett Corrente Kaleigha Chamberlin, OT/L   Acute OT Clinical Specialist Acute Rehabilitation Services Pager [phone] Office [phone]  08/30/2021, 10:22 AM

## 2021-08-30 NOTE — Sedation Documentation (Signed)
Right radial sheath removed. TR band applied with 12cc of air @ 1532.

## 2021-08-30 NOTE — Progress Notes (Signed)
ANTICOAGULATION CONSULT NOTE Pharmacy Consult for Heparin Indication: basilar artery thrombosis  Allergies  Allergen Reactions   Duloxetine Tinitus    Other reaction(s): Other (See Comments) Dizziness Dizziness, tinnitus     Patient Measurements: Height: 6' (182.9 cm) Weight: 92 kg (202 lb 13.2 oz) IBW/kg (Calculated) : 77.6 Height: 72 inches Heparin Dosing Weight: 92 kg  Vital Signs: Temp: 98.2 F (36.8 C) (11/02 2340) Temp Source: Oral (11/02 2000) BP: 164/96 (11/02 2245) Pulse Rate: 88 (11/02 2245)  Labs: Recent Labs    08/29/21 0932 08/29/21 0934 08/29/21 1646 08/29/21 2340  HGB 13.7 14.3  --  14.6  HCT 41.0 42.0  --  43.7  PLT 378  --   --  284  APTT 28  --   --   --   LABPROT 13.0  --   --  13.3  INR 1.0  --   --  1.0  HEPARINUNFRC  --   --  0.17* 0.35  CREATININE 1.03 0.90  --   --     Estimated Creatinine Clearance: 92.2 mL/min (by C-G formula based on SCr of 0.9 mg/dL).  Assessment: 65 y.o. 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   Geannie Risen, PharmD, BCPS  08/30/2021 12:28 AM

## 2021-08-31 ENCOUNTER — Telehealth: Payer: Self-pay | Admitting: *Deleted

## 2021-08-31 ENCOUNTER — Inpatient Hospital Stay (HOSPITAL_COMMUNITY): Payer: 59 | Admitting: Anesthesiology

## 2021-08-31 ENCOUNTER — Inpatient Hospital Stay (HOSPITAL_COMMUNITY): Payer: 59

## 2021-08-31 ENCOUNTER — Inpatient Hospital Stay (HOSPITAL_COMMUNITY)
Admit: 2021-08-31 | Discharge: 2021-08-31 | Disposition: A | Discharge status: Home / Self Care | Payer: 59 | Attending: Physician Assistant | Admitting: Physician Assistant

## 2021-08-31 ENCOUNTER — Encounter (HOSPITAL_COMMUNITY): Payer: Self-pay | Admitting: Interventional Radiology

## 2021-08-31 ENCOUNTER — Encounter (HOSPITAL_COMMUNITY)
Admission: EM | Disposition: A | Discharge status: Rehabilitation Facility | Payer: Self-pay | Source: Home / Self Care | Attending: Neurology

## 2021-08-31 DIAGNOSIS — I63111 Cerebral infarction due to embolism of right vertebral artery: Secondary | ICD-10-CM | POA: Diagnosis not present

## 2021-08-31 DIAGNOSIS — I639 Cerebral infarction, unspecified: Secondary | ICD-10-CM

## 2021-08-31 DIAGNOSIS — Q2112 Patent foramen ovale: Secondary | ICD-10-CM

## 2021-08-31 DIAGNOSIS — I6322 Cerebral infarction due to unspecified occlusion or stenosis of basilar arteries: Secondary | ICD-10-CM | POA: Diagnosis not present

## 2021-08-31 DIAGNOSIS — I651 Occlusion and stenosis of basilar artery: Secondary | ICD-10-CM | POA: Diagnosis not present

## 2021-08-31 DIAGNOSIS — I161 Hypertensive emergency: Secondary | ICD-10-CM

## 2021-08-31 HISTORY — PX: TEE WITHOUT CARDIOVERSION: SHX5443

## 2021-08-31 HISTORY — PX: BUBBLE STUDY: SHX6837

## 2021-08-31 LAB — GLUCOSE, CAPILLARY
Glucose-Capillary: 125 mg/dL — ABNORMAL HIGH (ref 70–99)
Glucose-Capillary: 134 mg/dL — ABNORMAL HIGH (ref 70–99)
Glucose-Capillary: 127 mg/dL — ABNORMAL HIGH (ref 70–99)
Glucose-Capillary: 123 mg/dL — ABNORMAL HIGH (ref 70–99)
Glucose-Capillary: 132 mg/dL — ABNORMAL HIGH (ref 70–99)
Glucose-Capillary: 111 mg/dL — ABNORMAL HIGH (ref 70–99)

## 2021-08-31 LAB — BASIC METABOLIC PANEL
Anion gap: 9 (ref 5–15)
BUN: 9 mg/dL (ref 8–23)
CO2: 21 mmol/L — ABNORMAL LOW (ref 22–32)
Calcium: 8 mg/dL — ABNORMAL LOW (ref 8.9–10.3)
Chloride: 104 mmol/L (ref 98–111)
Creatinine, Ser: 0.75 mg/dL (ref 0.61–1.24)
GFR, Estimated: >60 mL/min (ref >60)
Glucose, Bld: 133 mg/dL — ABNORMAL HIGH (ref 70–99)
Potassium: 3.3 mmol/L — ABNORMAL LOW (ref 3.5–5.1)
Sodium: 134 mmol/L — ABNORMAL LOW (ref 135–145)

## 2021-08-31 LAB — CBC
HCT: 38.2 % — ABNORMAL LOW (ref 39.0–52.0)
Hemoglobin: 12.8 g/dL — ABNORMAL LOW (ref 13.0–17.0)
MCH: 32 pg (ref 26.0–34.0)
MCHC: 33.5 g/dL (ref 30.0–36.0)
MCV: 95.5 fL (ref 80.0–100.0)
Platelets: 308 10*3/uL (ref 150–400)
RBC: 4 MIL/uL — ABNORMAL LOW (ref 4.22–5.81)
RDW: 14.3 % (ref 11.5–15.5)
WBC: 13.7 10*3/uL — ABNORMAL HIGH (ref 4.0–10.5)
nRBC: 0 % (ref 0.0–0.2)

## 2021-08-31 LAB — HEPARIN LEVEL (UNFRACTIONATED): Heparin Unfractionated: <0.1 IU/mL — ABNORMAL LOW (ref 0.30–0.70)

## 2021-08-31 SURGERY — ECHOCARDIOGRAM, TRANSESOPHAGEAL
Anesthesia: Monitor Anesthesia Care

## 2021-08-31 MED ORDER — PROPOFOL 10 MG/ML IV BOLUS
INTRAVENOUS | Status: DC | PRN
  Administered 2021-08-31: 20 mg via INTRAVENOUS
  Administered 2021-08-31: 40 mg via INTRAVENOUS
  Administered 2021-08-31: 30 mg via INTRAVENOUS

## 2021-08-31 MED ORDER — LABETALOL HCL 5 MG/ML IV SOLN
10.0000 mg | INTRAVENOUS | Status: DC | PRN
  Administered 2021-09-01 – 2021-09-02 (×5): 10 mg via INTRAVENOUS
  Filled 2021-08-31 (×6): qty 4

## 2021-08-31 MED ORDER — SODIUM CHLORIDE 0.9 % IV SOLN: INTRAVENOUS | Status: DC

## 2021-08-31 MED ORDER — ONDANSETRON HCL 4 MG/2ML IJ SOLN: 4.0000 mg | Freq: Once | INTRAMUSCULAR | Status: DC | PRN

## 2021-08-31 MED ORDER — FENTANYL CITRATE (PF) 100 MCG/2ML IJ SOLN: 25.0000 ug | INTRAMUSCULAR | Status: DC | PRN

## 2021-08-31 MED ORDER — CLEVIDIPINE BUTYRATE 0.5 MG/ML IV EMUL
0.0000 mg/h | INTRAVENOUS | Status: DC
  Administered 2021-08-31: 2 mg/h via INTRAVENOUS
  Administered 2021-09-01: 4 mg/h via INTRAVENOUS
  Filled 2021-08-31 (×3): qty 50

## 2021-08-31 MED ORDER — HYDRALAZINE HCL 20 MG/ML IJ SOLN
INTRAMUSCULAR | Status: AC
  Filled 2021-08-31: qty 1

## 2021-08-31 MED ORDER — AMLODIPINE BESYLATE 10 MG PO TABS
10.0000 mg | ORAL_TABLET | Freq: Every day | ORAL | Status: DC
  Administered 2021-08-31 – 2021-09-04 (×5): 10 mg via ORAL
  Filled 2021-08-31 (×5): qty 1

## 2021-08-31 MED ORDER — HYDRALAZINE HCL 20 MG/ML IJ SOLN
10.0000 mg | Freq: Once | INTRAMUSCULAR | Status: AC
  Administered 2021-08-31: 10 mg via INTRAVENOUS

## 2021-08-31 MED ORDER — ACETAMINOPHEN 10 MG/ML IV SOLN: 1000.0000 mg | Freq: Once | INTRAVENOUS | Status: DC | PRN

## 2021-08-31 MED ORDER — LOSARTAN POTASSIUM 50 MG PO TABS
50.0000 mg | ORAL_TABLET | Freq: Two times a day (BID) | ORAL | Status: DC
  Administered 2021-08-31 – 2021-09-04 (×8): 50 mg via ORAL
  Filled 2021-08-31 (×8): qty 1

## 2021-08-31 MED ORDER — LABETALOL HCL 5 MG/ML IV SOLN
10.0000 mg | Freq: Once | INTRAVENOUS | Status: AC
  Administered 2021-08-31: 10 mg via INTRAVENOUS

## 2021-08-31 MED ORDER — PROPOFOL 500 MG/50ML IV EMUL
INTRAVENOUS | Status: DC | PRN
  Administered 2021-08-31: 125 ug/kg/min via INTRAVENOUS

## 2021-08-31 MED ORDER — AMISULPRIDE (ANTIEMETIC) 5 MG/2ML IV SOLN: 10.0000 mg | Freq: Once | INTRAVENOUS | Status: DC | PRN

## 2021-08-31 NOTE — Progress Notes (Signed)
STROKE TEAM PROGRESS NOTE   SUBJECTIVE (INTERVAL HISTORY) His wife is at the bedside.  Pt lying in bed, neuro stable. Still has left sided numbness, diplopia, nystagums and right hearing loss but no new neuro changes. Had right VA and BA stenting yesterday with Dr. Corliss Skains. How on ASA brilinta and lipitor, will d/c heparin IV.    OBJECTIVE Temp:  [98.4 F (36.9 C)-99 F (37.2 C)] 98.5 F (36.9 C) (11/04 0800) Pulse Rate:  [62-90] 75 (11/04 0715) Cardiac Rhythm: Normal sinus rhythm (11/03 1630) Resp:  [9-22] 16 (11/04 0715) BP: (118-190)/(59-110) 144/70 (11/04 0715) SpO2:  [91 %-96 %] 94 % (11/04 0715) Arterial Line BP: (107-173)/(54-70) 156/64 (11/04 0715)  Recent Labs  Lab 08/30/21 1725 08/30/21 1944 08/30/21 2327 08/31/21 0343 08/31/21 0818  GLUCAP 164* 127* 118* 125* 134*   Recent Labs  Lab 08/29/21 0932 08/29/21 0934 08/30/21 0412 08/31/21 0409  NA 136 139 134* 134*  K 3.6 3.5 3.3* 3.3*  CL 108 107 103 104  CO2 17*  --  22 21*  GLUCOSE 183* 188* 128* 133*  BUN 10 10 7* 9  CREATININE 1.03 0.90 0.74 0.75  CALCIUM 9.0  --  8.9 8.0*   Recent Labs  Lab 08/29/21 0932  AST 22  ALT 15  ALKPHOS 59  BILITOT 0.8  PROT 6.4*  ALBUMIN 3.7   Recent Labs  Lab 08/29/21 0932 08/29/21 0934 08/29/21 2340 08/31/21 0409  WBC 11.0*  --  12.2* 13.7*  NEUTROABS 5.0  --   --   --   HGB 13.7 14.3 14.6 12.8*  HCT 41.0 42.0 43.7 38.2*  MCV 94.9  --  95.6 95.5  PLT 378  --  284 308   No results for input(s): CKTOTAL, CKMB, CKMBINDEX, TROPONINI in the last 168 hours. Recent Labs    08/29/21 0932 08/29/21 2340  LABPROT 13.0 13.3  INR 1.0 1.0   No results for input(s): COLORURINE, LABSPEC, PHURINE, GLUCOSEU, HGBUR, BILIRUBINUR, KETONESUR, PROTEINUR, UROBILINOGEN, NITRITE, LEUKOCYTESUR in the last 72 hours.  Invalid input(s): APPERANCEUR     Component Value Date/Time   CHOL 288 (H) 08/29/2021 2340   TRIG 82 08/29/2021 2340   HDL 52 08/29/2021 2340   CHOLHDL 5.5  08/29/2021 2340   VLDL 16 08/29/2021 2340   LDLCALC 220 (H) 08/29/2021 2340   Lab Results  Component Value Date   HGBA1C 5.5 08/29/2021      Component Value Date/Time   LABOPIA NONE DETECTED 08/30/2021 0751   COCAINSCRNUR NONE DETECTED 08/30/2021 0751   LABBENZ NONE DETECTED 08/30/2021 0751   AMPHETMU NONE DETECTED 08/30/2021 0751   THCU POSITIVE (A) 08/30/2021 0751   LABBARB NONE DETECTED 08/30/2021 0751    No results for input(s): ETH in the last 168 hours.  I have personally reviewed the radiological images below and agree with the radiology interpretations.  MR BRAIN WO CONTRAST  Result Date: 08/29/2021 CLINICAL DATA:  Stroke, follow up EXAM: MRI HEAD WITHOUT CONTRAST TECHNIQUE: Multiplanar, multiecho pulse sequences of the brain and surrounding structures were obtained without intravenous contrast. COMPARISON:  None. FINDINGS: Brain: Reduced diffusion is present in the inferior right cerebellum. Additional small foci of involvement in the right brachium pontis and right pontomedullary junction. No significant mass effect. No evidence of intracranial hemorrhage. Patchy foci of T2 hyperintensity in the supratentorial white matter are nonspecific but may reflect mild chronic microvascular ischemic changes. Ventricles and sulci are within normal limits in size and configuration. Vascular: Major vessel flow voids at the  skull base are preserved. Skull and upper cervical spine: Normal marrow signal is preserved. Sinuses/Orbits: Paranasal sinus mucosal thickening. Orbits are unremarkable. Other: Sella is unremarkable.  Mastoid air cells are clear. 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 Small additional involvement of right brachium pontis and right pontomedullary junction. No hemorrhage or significant mass effect. Mild chronic microvascular ischemic changes. Electronically Signed   By: Guadlupe Spanish M.D.   On: 08/29/2021 13:03   CT CEREBRAL PERFUSION W  CONTRAST  Result Date: 08/29/2021 CLINICAL DATA:  Neuro deficit, acute, stroke suspected EXAM: CT ANGIOGRAPHY HEAD AND NECK CT PERFUSION BRAIN TECHNIQUE: Multidetector CT imaging of the head and neck was performed using the standard protocol during bolus administration of intravenous contrast. Multiplanar CT image reconstructions and MIPs were obtained to evaluate the vascular anatomy. Carotid stenosis measurements (when applicable) are obtained utilizing NASCET criteria, using the distal internal carotid diameter as the denominator. Multiphase CT imaging of the brain was performed following IV bolus contrast injection. Subsequent parametric perfusion maps were calculated using RAPID software. CONTRAST:  100 mL Omnipaque 350 COMPARISON:  None. FINDINGS: CTA NECK Aortic arch: Great vessel origins are patent. Right carotid system: Patent. Noncalcified plaque at the ICA origin causes 65% stenosis. Left carotid system: Patent. Mixed plaque at the ICA origin causes less than 50% stenosis. Vertebral arteries: Left vertebral artery is patent. Absent opacification of the proximal right vertebral artery at the origin with minimal reconstitution of the V1 segment followed by reocclusion. The V2 segment is occluded proximally with reconstitution at the C4 level. Skeleton: Mild cervical spine degenerative changes, greatest at C6-C7. Other neck: Unremarkable. Upper chest: No apical lung mass. Review of the MIP images confirms the above findings CTA HEAD Anterior circulation: Intracranial internal carotid arteries are patent with mild calcified plaque. Anterior and middle cerebral arteries are patent. Posterior circulation: There is decreased caliber of the intracranial left vertebral artery throughout its course. Appears to terminate at patent left PICA. Intracranial right vertebral artery is patent. Basilar is patent but there is low density within the lumen proximally. Superior cerebellar artery origins are patent. Bilateral  posterior communicating arteries are present. Posterior cerebral arteries are patent. Venous sinuses: As permitted by contrast timing, patent. Review of the MIP images confirms the above findings CT Brain Perfusion Findings: CBF (<30%) Volume: 0mL Perfusion (Tmax>6.0s) volume: 0mL Mismatch Volume: 0mL Infarction Location: None. IMPRESSION: Abnormal extracranial right vertebral artery with limited opacification proximally and reconstitution at the C4 level. May reflect high-grade origin stenosis. Right PICA or like AICA origins are not identified. Basilar artery is patent but there is question of nonocclusive clot proximally. Noncalcified plaque at the right ICA origin causes 65% stenosis. Mixed plaque at the left ICA origin causes less than 50% stenosis. Diffuse mild narrowing of the intracranial left vertebral artery, which terminates as a PICA. Perfusion imaging demonstrates no evidence of core infarction or penumbra, but there is limited evaluation of the posterior fossa. Initial results were provided by telephone at the time of interpretation on 08/29/2021 at 10:20 am to provider [facility] , who verbally acknowledged these results. Electronically Signed   By: Guadlupe Spanish M.D.   On: 08/29/2021 10:44   ECHOCARDIOGRAM COMPLETE  Result Date: 08/30/2021    ECHOCARDIOGRAM REPORT   Patient Name:   IRINEO Putt Date of Exam: 08/30/2021 Medical Rec #:  [id number]  Height:       72.0 in Accession #:    [id number] Weight:       202.8 lb  Date of Birth:  Aug 26, 1956   BSA:          2.143 m Patient Age:    65 years   BP:           155/93 mmHg Patient Gender: M          HR:           76 bpm. Exam Location:  Inpatient Procedure: 2D Echo, Color Doppler and Cardiac Doppler Indications:    Eval for PFO  History:        Patient has no prior history of Echocardiogram examinations.  Sonographer:    Cleatis Polka Referring Phys: [id number] KRISTI KEHOE IMPRESSIONS  1. Left ventricular ejection fraction, by estimation, is 60 to 65%. The  left ventricle has normal function. The left ventricle has no regional wall motion abnormalities. Left ventricular diastolic parameters are consistent with Grade II diastolic dysfunction (pseudonormalization).  2. Right ventricular systolic function is normal. The right ventricular size is normal. Tricuspid regurgitation signal is inadequate for assessing PA pressure.  3. The mitral valve is normal in structure. No evidence of mitral valve regurgitation. No evidence of mitral stenosis.  4. The aortic valve is tricuspid. Aortic valve regurgitation is not visualized. Mild aortic valve sclerosis is present, with no evidence of aortic valve stenosis.  5. The inferior vena cava is normal in size with greater than 50% respiratory variability, suggesting right atrial pressure of 3 mmHg. FINDINGS  Left Ventricle: Left ventricular ejection fraction, by estimation, is 60 to 65%. The left ventricle has normal function. The left ventricle has no regional wall motion abnormalities. The left ventricular internal cavity size was normal in size. There is  no left ventricular hypertrophy. Left ventricular diastolic parameters are consistent with Grade II diastolic dysfunction (pseudonormalization). Right Ventricle: The right ventricular size is normal. No increase in right ventricular wall thickness. Right ventricular systolic function is normal. Tricuspid regurgitation signal is inadequate for assessing PA pressure. Left Atrium: Left atrial size was normal in size. Right Atrium: Right atrial size was normal in size. Pericardium: There is no evidence of pericardial effusion. Mitral Valve: The mitral valve is normal in structure. No evidence of mitral valve regurgitation. No evidence of mitral valve stenosis. Tricuspid Valve: The tricuspid valve is normal in structure. Tricuspid valve regurgitation is not demonstrated. Aortic Valve: The aortic valve is tricuspid. Aortic valve regurgitation is not visualized. Mild aortic valve  sclerosis is present, with no evidence of aortic valve stenosis. Aortic valve peak gradient measures 9.4 mmHg. Pulmonic Valve: The pulmonic valve was normal in structure. Pulmonic valve regurgitation is trivial. Aorta: The aortic root is normal in size and structure. Venous: The inferior vena cava is normal in size with greater than 50% respiratory variability, suggesting right atrial pressure of 3 mmHg. IAS/Shunts: No atrial level shunt detected by color flow Doppler.  LEFT VENTRICLE PLAX 2D LVIDd:         4.40 cm      Diastology LVIDs:         2.80 cm      LV e' medial:    7.07 cm/s LV PW:         1.00 cm      LV E/e' medial:  8.9 LV IVS:        1.00 cm      LV e' lateral:   6.85 cm/s LVOT diam:     2.10 cm      LV E/e' lateral: 9.2 LV SV:  84 LV SV Index:   39 LVOT Area:     3.46 cm  LV Volumes (MOD) LV vol d, MOD A2C: 96.1 ml LV vol d, MOD A4C: 107.0 ml LV vol s, MOD A2C: 43.0 ml LV vol s, MOD A4C: 44.4 ml LV SV MOD A2C:     53.1 ml LV SV MOD A4C:     107.0 ml LV SV MOD BP:      58.1 ml RIGHT VENTRICLE             IVC RV Basal diam:  2.60 cm     IVC diam: 1.70 cm RV Mid diam:    2.50 cm RV S prime:     15.40 cm/s TAPSE (M-mode): 2.5 cm LEFT ATRIUM           Index        RIGHT ATRIUM           Index LA diam:      3.60 cm 1.68 cm/m   RA Area:     14.90 cm LA Vol (A2C): 37.2 ml 17.36 ml/m  RA Volume:   29.30 ml  13.67 ml/m LA Vol (A4C): 50.2 ml 23.42 ml/m  AORTIC VALVE AV Area (Vmax): 2.94 cm AV Vmax:        153.00 cm/s AV Peak Grad:   9.4 mmHg LVOT Vmax:      130.00 cm/s LVOT Vmean:     73.000 cm/s LVOT VTI:       0.243 m  AORTA Ao Root diam: 2.90 cm Ao Asc diam:  3.40 cm MITRAL VALVE MV Area (PHT): 3.31 cm    SHUNTS MV Decel Time: 229 msec    Systemic VTI:  0.24 m MV E velocity: 62.90 cm/s  Systemic Diam: 2.10 cm MV A velocity: 67.00 cm/s MV E/A ratio:  0.94 Dalton McleanMD Electronically signed by Wilfred Lacy Signature Date/Time: 08/30/2021/2:45:21 PM    Final    CT HEAD CODE STROKE WO  CONTRAST  Result Date: 08/29/2021 CLINICAL DATA:  Code stroke.  Left-sided weakness EXAM: CT HEAD WITHOUT CONTRAST TECHNIQUE: Contiguous axial images were obtained from the base of the skull through the vertex without intravenous contrast. COMPARISON:  None. FINDINGS: Brain: There is no acute intracranial hemorrhage, mass effect, or edema. Gray-white differentiation is preserved. There is an age-indeterminate small infarct of the right inferior cerebellum. Ventricles and sulci are normal in size and configuration. No extra-axial collection. Vascular: No hyperdense vessel. Intracranial atherosclerotic calcification at the skull base. Skull: Unremarkable. Sinuses/Orbits: Lobular mucosal thickening. Orbits are unremarkable. Other: Mastoid air cells are clear. ASPECTS [facility] Stroke Program Early CT Score) - Ganglionic level infarction (caudate, lentiform nuclei, internal capsule, insula, M1-M3 cortex): 7 - Supraganglionic infarction (M4-M6 cortex): 3 Total score (0-10 with 10 being normal): 10 IMPRESSION: There is no acute intracranial hemorrhage. ASPECT score is 10. 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 These results were communicated to Dr. Wilford Corner at 9:44 am on 08/29/2021 by text page via the [facility] messaging system. Electronically Signed   By: Guadlupe Spanish M.D.   On: 08/29/2021 09:46   VAS [place] LOWER EXTREMITY VENOUS (DVT)  Result Date: 08/31/2021  Lower Venous DVT Study Patient Name:  CHIDIEBUBE PATRIZIO  Date of Exam:   08/31/2021 Medical Rec #: [id number]   Accession #:    [id number] Date of Birth: 1956-06-11    Patient Gender: M Patient Age:   52 years Exam Location:  [facility] Procedure:      VAS  [place] LOWER EXTREMITY VENOUS (DVT) Referring Phys: Scheryl Marten Ashraf Mesta --------------------------------------------------------------------------------  Indications: Stroke.  Comparison Study: no prior Performing Technologist: [place] Ponder RVS  Examination Guidelines: A complete evaluation includes B-mode  imaging, spectral Doppler, color Doppler, and power Doppler as needed of all accessible portions of each vessel. Bilateral testing is considered an integral part of a complete examination. Limited examinations for reoccurring indications may be performed as noted. The reflux portion of the exam is performed with the patient in reverse Trendelenburg.  +---------+---------------+---------+-----------+----------+--------------+ RIGHT    CompressibilityPhasicitySpontaneityPropertiesThrombus Aging +---------+---------------+---------+-----------+----------+--------------+ CFV      Full           Yes      Yes                                 +---------+---------------+---------+-----------+----------+--------------+ SFJ      Full                                                        +---------+---------------+---------+-----------+----------+--------------+ FV Prox  Full                                                        +---------+---------------+---------+-----------+----------+--------------+ FV Mid   Full                                                        +---------+---------------+---------+-----------+----------+--------------+ FV DistalFull                                                        +---------+---------------+---------+-----------+----------+--------------+ PFV      Full                                                        +---------+---------------+---------+-----------+----------+--------------+ POP      Full           Yes      Yes                                 +---------+---------------+---------+-----------+----------+--------------+ PTV      Full                                                        +---------+---------------+---------+-----------+----------+--------------+ PERO     Full                                                        +---------+---------------+---------+-----------+----------+--------------+    +---------+---------------+---------+-----------+----------+--------------+  LEFT     CompressibilityPhasicitySpontaneityPropertiesThrombus Aging +---------+---------------+---------+-----------+----------+--------------+ CFV      Full           Yes      Yes                                 +---------+---------------+---------+-----------+----------+--------------+ SFJ      Full                                                        +---------+---------------+---------+-----------+----------+--------------+ FV Prox  Full                                                        +---------+---------------+---------+-----------+----------+--------------+ FV Mid   Full                                                        +---------+---------------+---------+-----------+----------+--------------+ FV DistalFull                                                        +---------+---------------+---------+-----------+----------+--------------+ PFV      Full                                                        +---------+---------------+---------+-----------+----------+--------------+ POP      Full           Yes      Yes                                 +---------+---------------+---------+-----------+----------+--------------+ PTV      Full                                                        +---------+---------------+---------+-----------+----------+--------------+ PERO     Full                                                        +---------+---------------+---------+-----------+----------+--------------+     Summary: BILATERAL: - No evidence of deep vein thrombosis seen in the lower extremities, bilaterally. - No evidence of superficial venous thrombosis in the lower extremities, bilaterally. -   *See table(s) above for measurements and observations.    Preliminary  CT ANGIO HEAD NECK W WO CM (CODE STROKE)  Result Date: 08/29/2021 CLINICAL  DATA:  Neuro deficit, acute, stroke suspected EXAM: CT ANGIOGRAPHY HEAD AND NECK CT PERFUSION BRAIN TECHNIQUE: Multidetector CT imaging of the head and neck was performed using the standard protocol during bolus administration of intravenous contrast. Multiplanar CT image reconstructions and MIPs were obtained to evaluate the vascular anatomy. Carotid stenosis measurements (when applicable) are obtained utilizing NASCET criteria, using the distal internal carotid diameter as the denominator. Multiphase CT imaging of the brain was performed following IV bolus contrast injection. Subsequent parametric perfusion maps were calculated using RAPID software. CONTRAST:  100 mL Omnipaque 350 COMPARISON:  None. FINDINGS: CTA NECK Aortic arch: Great vessel origins are patent. Right carotid system: Patent. Noncalcified plaque at the ICA origin causes 65% stenosis. Left carotid system: Patent. Mixed plaque at the ICA origin causes less than 50% stenosis. Vertebral arteries: Left vertebral artery is patent. Absent opacification of the proximal right vertebral artery at the origin with minimal reconstitution of the V1 segment followed by reocclusion. The V2 segment is occluded proximally with reconstitution at the C4 level. Skeleton: Mild cervical spine degenerative changes, greatest at C6-C7. Other neck: Unremarkable. Upper chest: No apical lung mass. Review of the MIP images confirms the above findings CTA HEAD Anterior circulation: Intracranial internal carotid arteries are patent with mild calcified plaque. Anterior and middle cerebral arteries are patent. Posterior circulation: There is decreased caliber of the intracranial left vertebral artery throughout its course. Appears to terminate at patent left PICA. Intracranial right vertebral artery is patent. Basilar is patent but there is low density within the lumen proximally. Superior cerebellar artery origins are patent. Bilateral posterior communicating arteries are  present. Posterior cerebral arteries are patent. Venous sinuses: As permitted by contrast timing, patent. Review of the MIP images confirms the above findings CT Brain Perfusion Findings: CBF (<30%) Volume: 0mL Perfusion (Tmax>6.0s) volume: 0mL Mismatch Volume: 0mL Infarction Location: None. IMPRESSION: Abnormal extracranial right vertebral artery with limited opacification proximally and reconstitution at the C4 level. May reflect high-grade origin stenosis. Right PICA or like AICA origins are not identified. Basilar artery is patent but there is question of nonocclusive clot proximally. Noncalcified plaque at the right ICA origin causes 65% stenosis. Mixed plaque at the left ICA origin causes less than 50% stenosis. Diffuse mild narrowing of the intracranial left vertebral artery, which terminates as a PICA. Perfusion imaging demonstrates no evidence of core infarction or penumbra, but there is limited evaluation of the posterior fossa. Initial results were provided by telephone at the time of interpretation on 08/29/2021 at 10:20 am to provider [facility] , who verbally acknowledged these results. Electronically Signed   By: Guadlupe Spanish M.D.   On: 08/29/2021 10:44     PHYSICAL EXAM  Temp:  [98.4 F (36.9 C)-99 F (37.2 C)] 98.5 F (36.9 C) (11/04 0800) Pulse Rate:  [62-90] 75 (11/04 0715) Resp:  [9-22] 16 (11/04 0715) BP: (118-190)/(59-110) 144/70 (11/04 0715) SpO2:  [91 %-96 %] 94 % (11/04 0715) Arterial Line BP: (107-173)/(54-70) 156/64 (11/04 0715)  General - Well nourished, well developed, lethargic but not in acute distress.  Ophthalmologic - fundi not visualized due to noncooperation.  Cardiovascular - Regular rhythm and rate.  Neuro - lethargic, alert, eyes open on request, orientated to age, place, time and people. No aphasia, fluent language, mild dysarthria, following all simple commands. Able to name and repeat.  Right eye lateral gaze incomplete, left gaze with coarse  nystagmus direction  to the left, right gaze unsustained small amplitude nystagmus direction to the right, visual field full, left pupil 2 mm, right pupil 2 mm, brisk to light reaction.  Right facial droop and decreased strength for right eye closure and right raising up forehead.  Right hearing loss.  Tongue midline. Bilateral UEs 5/5, no drift. Bilaterally LEs 5/5, no drift. Sensation decreased on the left arm and leg in the face, right finger-to-nose ataxic, left finger-to-nose and bilateral heel-to-shin grossly intact.  Gait not tested.     ASSESSMENT/PLAN Mr. Gearold Wainer is a 65 y.o. male with no known medical history admitted for left-sided numbness, dizziness, headache diplopia.  Developed right facial droop, slurred speech and right hearing loss overnight.  No tPA given due to outside window.    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 Cardioembolic can not be completely ruled out but less likely  Resultant right peripheral facial droop (CN VII), right lateral gaze incomplete (CN VI), right hearing loss (CN VIII and AICA territory), left sided numbness (right spinal lemniscus and right trigeminothalamic tract), right cerebellum (right PICA and AICA) CT showed right cerebellar infarct CT head and neck right VA origin occlusion, reconstituted at distal V2.  Right PICA occlusion, mid basilar artery high-grade stenosis versus thrombosis, right ICA 65% stenosis, left VA ends at PICA MRI right cerebellar infarct, and right lower pontine infarct. IR right VA origin, mid to basilar artery prominent stenosis, status post stenting in both arteries.  Severe 90% stenosis right ICA proximal, 50% stenosis left ICA proximal. MRI repeat to further confirm the extent of stroke 2D Echo EF 60 to 65% TEE pending today Recommend 30 day cardiac event monitoring as outpt to  rule out afib LDL 220 HgbA1c 5.5 Heparin IV for VTE prophylaxis No antithrombotic prior to admission, now on aspirin 81 mg daily, Brilinta (ticagrelor) 90 mg bid, and now off heparin IV.  Patient counseled to be compliant with his antithrombotic medications Ongoing aggressive stroke risk factor management Therapy recommendations: Pending Disposition: Pending  Carotid stenosis, bilateral CTA head and neck showed right ICA 65% stenosis Cerebral angiogram showed right ICA proximal 90% stenosis, left ICA proximal 50% stenosis Dr. Corliss Skains plan for right ICA stenting in the near future  Hypertension Unstable BP goal relax to < 160 today On Cleviprex, taper off as able. Will put on BP meds once TEE done. 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 This patient's care requires constant monitoring of vital signs, hemodynamics, respiratory and cardiac monitoring, review of multiple databases, neurological assessment, discussion with family, other specialists and medical decision making of high complexity. I spent 50 minutes of neurocritical care time in the care of this patient. I had long discussion with wife and pt at bedside, updated pt current condition, treatment plan and potential prognosis, and answered all the questions. They expressed understanding and appreciation.    Marvel Plan, MD PhD Stroke  Neurology 08/31/2021 10:12 AM    To contact Stroke Continuity  provider, please refer to [email or web address]. After hours, contact General Neurology

## 2021-08-31 NOTE — Telephone Encounter (Signed)
-----   Message from [place], [place] sent at 08/30/2021  5:10 PM EDT ----- Regarding: 30 day monitor and follow up Patient is new to [place], admitted for stroke, neurology requested a 30 day monitor. Please arrange. Also please arrange a 2 month follow up with a general cardiologist to review monitor result.   Thank you.  Wynema Birch

## 2021-08-31 NOTE — Progress Notes (Signed)
Lower extremity venous has been completed.   Preliminary results in CV Proc.   Aundra Millet Thomos Domine 08/31/2021 9:56 AM

## 2021-08-31 NOTE — Progress Notes (Signed)
Inpatient Rehab Admissions Coordinator Note:   Per therapy patient was screened for CIR candidacy by Stephania Fragmin, PT. At this time, pt appears to be a potential candidate for CIR. I will place an order for rehab consult for full assessment, per our protocol.  Please contact me any with questions.Estill Dooms, PT, DPT [phone] 08/31/21 12:58 PM

## 2021-08-31 NOTE — CV Procedure (Signed)
Brief TEE Note  LVEF 60-65% No LA/LAA thrombus or mass +PFO with R-->L shunting Atherosclerosis of the descending aorta.  For additional details see full report.   Maurice Little C. [name], MD, [facility] 08/31/2021 2:39 PM

## 2021-08-31 NOTE — Progress Notes (Signed)
Occupational Therapy Evaluation  PTA pt lives independently with wife and works in Consulting civil engineer. Session limited by Nsg pulling A-line. Pt with horizontal diplopia. Pt appears R eye dominant, Nasal portion of L lens taped with 68M Transpore tape to diminish double image and improve functional vision. Pt with immediate relief and very appreciative. Leave tape on L lens and do not reposition  - wife educated on purpose of taping. Will return to mobilize OOB.  Very supportive wife who was present during eval. Given significant functional change in status recommend rehab at [facility]. Will follow acutely.     08/31/21 1300  OT Visit Information  Last OT Received On 08/31/21  Assistance Needed +2  PT/OT/SLP Co-Evaluation/Treatment Yes  Reason for Co-Treatment Complexity of the patient's impairments (multi-system involvement);For patient/therapist safety;To address functional/ADL transfers  OT goals addressed during session ADL's and self-care  History of Present Illness 65 y/o male presented to ED on 11/2 for R facial droop, R sided weakness, headache, and dizziness. 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 Plan for TEE 11/4. PMH: HTN, HLD, PVD  Precautions  Precautions Fall  Precaution Comments diplopia, Ataxic R; L side numb  Home Living  Family/patient expects to be discharged to: Private residence  Living Arrangements Spouse/significant other  Available Help at Discharge Family;Available 24 hours/day  Type of Home House  Home Access Stairs to enter  Entrance Stairs-Number of Steps 2  Entrance Stairs-Rails Right;Left  Home Layout One level  Scientist, physiological No  Home Equipment Shower seat - built in;Hand held shower head  Prior Function  Prior Level of Function  Independent/Modified  Independent;Working/employed;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, early years/pre)  ADLs 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)  Pain Assessment  Pain Assessment Faces  Faces Pain Scale 2  Pain Location discomfort from dizziness  Cognition  Arousal/Alertness Awake/alert  Behavior During Therapy Flat affect  Overall Cognitive Status Impaired/Different from baseline  Area of Impairment Attention;Following commands;Safety/judgement;Awareness;Problem solving  Current Attention Level Sustained  Following Commands Follows one step commands with increased time  Safety/Judgement Decreased awareness of safety;Decreased awareness of deficits  Awareness Emergent  Problem Solving Slow processing;Difficulty sequencing;Requires verbal cues;Requires tactile cues  General Comments Will further assess; when talking with him, he demonstrated an echolalia at times instead of answering quesitons  Upper Extremity Assessment  Upper Extremity Assessment RUE deficits/detail;LUE deficits/detail  RUE Deficits / Details ROM adn strength WFL; apparent sensory motor impariment leading to ataxic movements  RUE Coordination decreased fine motor;decreased gross motor  LUE Deficits / Details ROM most likely limited by inability to feel; Strength  - weaker proximally but funcitonal; overall 4/5 throughout  LUE Sensation decreased light touch;decreased proprioception  LUE Coordination decreased fine motor;decreased gross motor  Lower Extremity Assessment  Lower Extremity Assessment Defer to PT evaluation  Cervical / Trunk Assessment  Cervical / Trunk Assessment Other exceptions (R bias; difficulty maintaining midline trunk control)  Vision- History  Baseline Vision/History 1 Wears glasses  Patient Visual Report Diplopia  Vision- Assessment  Vision Assessment? Yes  Eye Alignment Impaired (comment)  Ocular Range of Motion Restricted on the right  Alignment/Gaze  Preference Head tilt;Gaze right  Tracking/Visual Pursuits Right eye does not track laterally;Decreased smoothness of horizontal tracking;Decreased smoothness of vertical tracking  Saccades Decreased speed  of saccadic movement;Impaired - to be further tested in functional context;Additional head turns occurred during testing  Diplopia Assessment Disappears with one eye closed;Objects split side to side;Present all the time/all directions  Depth Perception Overshoots;Undershoots  Additional Comments Appears to be R eye dominant (nasal portion of L lens taped wtih immediate relief regarding diplopia; wife educated on use and purpose of tape)  Perception  Comments will further assess  Praxis  Praxis tested? WFL  ADL  Overall ADL's  Needs assistance/impaired  Eating/Feeding NPO  Grooming Maximal assistance  Grooming Details (indicate cue type and reason) brushing teeth with toothette and suction; difficulty controlling movement patterns  Upper Body Bathing Maximal assistance  Lower Body Bathing Maximal assistance;Sit to/from stand  Upper Body Dressing  Maximal assistance;Total assistance  Lower Body Dressing Total assistance  Toilet Transfer Maximal assistance;+2 for physical assistance  Toileting- Clothing Manipulation and Hygiene Total assistance  Functional mobility during ADLs Maximal assistance;+2 for physical assistance  Bed Mobility  Overal bed mobility Needs Assistance  Bed Mobility Supine to Sit  Supine to sit Mod assist  General bed mobility comments cues for sequencing. modA to guide LEs to EOB and trunk elevation. Dizziness reported upon sitting EOB. Cues for focusing on object  Transfers  Overall transfer level Needs assistance  Equipment used 2 person hand held assist  Transfers Sit to/from [employer] Transfers  Sit to Stand Mod assist;+2 physical assistance;+2 safety/equipment  Stand pivot transfers Max assist;+2 physical assistance;+2 safety/equipment  General  transfer comment modA+2 to stand from EOB and maxA+2 to take pivotal steps towards recliner. Cues for stepping with LLE due to patient leaving leg behind demonstrating inattention  Balance  Overall balance assessment Needs assistance  Sitting-balance support Bilateral upper extremity supported;Feet supported  Sitting balance-Leahy Scale Poor  Sitting balance - Comments min-modA to maintain sitting balance initially progressing to min guard briefly. Mild truncal ataxia noted  Standing balance support Bilateral upper extremity supported;During functional activity  Standing balance-Leahy Scale Zero  Standing balance comment reliant on external assist to maintain standing balance  Exercises  Exercises Other exercises  Other Exercises  Other Exercises gaze stabilization  Other Exercises smooth pursuits  OT - End of Session  Equipment Utilized During Treatment Gait belt  Activity Tolerance Patient tolerated treatment well;Other (comment) (Session limimted by nsg pulling A-line)  Patient left in bed  Nurse Communication Mobility status  OT Assessment  OT Recommendation/Assessment Patient needs continued OT Services  OT Visit Diagnosis Unsteadiness on feet (R26.81);Other abnormalities of gait and mobility (R26.89);Muscle weakness (generalized) (M62.81);Low vision, both eyes (H54.2);Other symptoms and signs involving the nervous system (R29.898);Other symptoms and signs involving cognitive function;Dizziness and giddiness (R42);Pain  Pain - part of body  (general discomfort)  OT Plan  OT Frequency (ACUTE ONLY) Min 3X/week (visioni involved)  OT Treatment/Interventions (ACUTE ONLY) Self-care/ADL training;Therapeutic exercise;Neuromuscular education;DME and/or AE instruction;Therapeutic activities;Cognitive remediation/compensation;Visual/perceptual remediation/compensation;Patient/family education;Balance training  AM-PAC OT "6 Clicks" Daily Activity Outcome Measure (Version 2)  Help from another  person eating meals? 1  Help from another person taking care of personal grooming? 2  Help from another person toileting, which includes using toliet, bedpan, or urinal? 1  Help from another person bathing (including washing, rinsing, drying)? 2  Help from another person to put on and taking off regular upper body clothing? 2  Help from another person to put on and taking off regular lower body clothing? 1  6 Click Score 9  Progressive Mobility  What is the highest level of mobility based  on the progressive mobility assessment? Level 2 (Chairfast) - Balance while sitting on edge of bed and cannot stand  Mobility Out of bed to chair with meals  OT Recommendation  Recommendations for Other Services Rehab consult  Follow Up Recommendations Acute inpatient rehab (3hours/day)  Assistance recommended at discharge Frequent or constant Supervision/Assistance  Functional Status Assessent Patient has had a recent decline in their functional status and demonstrates the ability to make significant improvements in function in a reasonable and predictable amount of time.  OT Equipment BSC;Hospital bed;Wheelchair (measurements OT);Wheelchair cushion (measurements OT)  Individuals Consulted  Consulted and Agree with Results and Recommendations Patient;Family member/caregiver  Family Member Consulted wife  Acute Rehab OT Goals  Patient Stated Goal to get better  OT Goal Formulation With patient/family  Time For Goal Achievement 09/14/21  Potential to Achieve Goals Good  OT Time Calculation  OT Start Time (ACUTE ONLY) 1031  OT Stop Time (ACUTE ONLY) 1048  OT Time Calculation (min) 17 min  OT General Charges  $OT Visit 1 Visit  OT Evaluation  $OT Eval Moderate Complexity 1 Mod  Written Expression  Dominant Hand Right  Luisa Dago, OT/L   Acute OT Clinical Specialist Acute Rehabilitation Services Pager [phone] Office [phone]

## 2021-08-31 NOTE — Progress Notes (Signed)
SLP Cancellation Note  Patient Details Name: Maurice Little MRN: [id number] DOB: 1956/05/13   Cancelled treatment:        Pt out of room. Will continue efforts.    Royce Macadamia 08/31/2021, 4:03 PM

## 2021-08-31 NOTE — TOC CAGE-AID Note (Signed)
Transition of Care [facility]) - CAGE-AID Screening   Patient Details  Name: Maurice Little MRN: [id number] Date of Birth: [birth year]  [facility] CM/SW Contact:    Rakel Junio C Tarpley-Carter, LCSWA Phone Number: 08/31/2021, 10:29 AM   Clinical Narrative: Pt is unable to participate in Cage Aid. CSW will provide resources to pt for possible future use.  Korben Carcione Tarpley-Carter, MSW, LCSW-A Pronouns:  She/Her/Hers [facility] HealthTransitions of Care Clinical Social Worker Direct Number:  [phone] [email or web address]  CAGE-AID Screening: Substance Abuse Screening unable to be completed due to: : Patient unable to participate             Substance Abuse Education Offered: Yes  Substance abuse interventions: Transport planner

## 2021-08-31 NOTE — Progress Notes (Signed)
Referring Physician(s): Dr. Fatima Sanger   Supervising Physician: Julieanne Cotton  Patient Status:  [facility] - In-pt  Chief Complaint: Code Stroke  Subjective: Pt presented to [facility] ED via EMS as Code Stroke 08/29/21 c/o left-sided weakness and facial droop. Pt reports that he noticed dizziness and headaches 10/31-11/1 but symptoms resolved. 08/29/21, his symptoms returned while he was using the restroom with tingling to left-sided face, arm and leg.  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 Corliss Skains.   Today pt reports right side facial numbness and well as right sided hearing loss. He states that he noticed this just prior to his intervention yesterday. He adds that he has blurred vision, worse to left eye. He c/o dysphagia and no appetite, stating that he just wants to sleep.   Allergies: Duloxetine  Medications: Prior to Admission medications   Medication Sig Start Date End Date Taking? Authorizing Provider  Acetylcysteine (NAC PO) Take 1 tablet by mouth daily.   Yes [provider]  aspirin EC 81 MG tablet Take 81 mg by mouth daily. Swallow whole.   Yes [provider]  Cyanocobalamin (B-12 PO) Take 1 tablet by mouth daily.   Yes [provider]  ibuprofen (ADVIL) 200 MG tablet Take 400 mg by mouth every 6 (six) hours as needed for fever, headache or mild pain.   Yes [provider]  OVER THE COUNTER MEDICATION Take 1 tablet by mouth daily. Delta 3 gummie   Yes [provider]     Vital Signs: BP (!) 144/70   Pulse 75   Temp 98.6 F (37 C) (Oral)   Resp 16   Ht 6' (1.829 m)   Wt 202 lb 13.2 oz (92 kg)   SpO2 94%   BMI 27.51 kg/m   Physical Exam Constitutional:      Appearance: He is ill-appearing.  HENT:     Head: Normocephalic and atraumatic.  Eyes:     Pupils: Pupils are equal, round,  and reactive to light.  Cardiovascular:     Rate and Rhythm: Normal rate and regular rhythm.  Pulmonary:     Effort: Pulmonary effort is normal.  Musculoskeletal:     Right lower leg: No edema.     Left lower leg: No edema.  Skin:    General: Skin is warm and dry.     Comments: Right radial puncture site soft w/o hematoma   Neurological:     Mental Status: He is alert and oriented to person, place, and time.     Comments: Alert, aware and oriented X 3 Speech is slurred, comprehension is intact.  PERRL bilaterally L sided facial droop Tongue midline Can spontaneously move all 4 extremities. Hand grip strength equal bilaterally. Dorsiflexion 5/5 bilaterally. Plantar flection 5/5 bilaterally.   Fine motor and coordination slow but intact.   Negative pronator drift.  Gait not assessed Romberg not assessed Heel to toe not assessed Distal pulses not assessed   Psychiatric:        Mood and Affect: Mood normal.        Behavior: Behavior normal.        Thought Content: Thought content normal.        Judgment: Judgment normal.    Imaging: MR BRAIN WO CONTRAST  Result Date: 08/29/2021 CLINICAL DATA:  Stroke, follow up EXAM: MRI HEAD WITHOUT CONTRAST TECHNIQUE: Multiplanar, multiecho pulse  sequences of the brain and surrounding structures were obtained without intravenous contrast. COMPARISON:  None. FINDINGS: Brain: Reduced diffusion is present in the inferior right cerebellum. Additional small foci of involvement in the right brachium pontis and right pontomedullary junction. No significant mass effect. No evidence of intracranial hemorrhage. Patchy foci of T2 hyperintensity in the supratentorial white matter are nonspecific but may reflect mild chronic microvascular ischemic changes. Ventricles and sulci are within normal limits in size and configuration. Vascular: Major vessel flow voids at the skull base are preserved. Skull and upper cervical spine: Normal marrow signal is preserved.  Sinuses/Orbits: Paranasal sinus mucosal thickening. Orbits are unremarkable. Other: Sella is unremarkable.  Mastoid air cells are clear. 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 Small additional involvement of right brachium pontis and right pontomedullary junction. No hemorrhage or significant mass effect. Mild chronic microvascular ischemic changes. Electronically Signed   By: Guadlupe Spanish M.D.   On: 08/29/2021 13:03   CT CEREBRAL PERFUSION W CONTRAST  Result Date: 08/29/2021 CLINICAL DATA:  Neuro deficit, acute, stroke suspected EXAM: CT ANGIOGRAPHY HEAD AND NECK CT PERFUSION BRAIN TECHNIQUE: Multidetector CT imaging of the head and neck was performed using the standard protocol during bolus administration of intravenous contrast. Multiplanar CT image reconstructions and MIPs were obtained to evaluate the vascular anatomy. Carotid stenosis measurements (when applicable) are obtained utilizing NASCET criteria, using the distal internal carotid diameter as the denominator. Multiphase CT imaging of the brain was performed following IV bolus contrast injection. Subsequent parametric perfusion maps were calculated using RAPID software. CONTRAST:  100 mL Omnipaque 350 COMPARISON:  None. FINDINGS: CTA NECK Aortic arch: Great vessel origins are patent. Right carotid system: Patent. Noncalcified plaque at the ICA origin causes 65% stenosis. Left carotid system: Patent. Mixed plaque at the ICA origin causes less than 50% stenosis. Vertebral arteries: Left vertebral artery is patent. Absent opacification of the proximal right vertebral artery at the origin with minimal reconstitution of the V1 segment followed by reocclusion. The V2 segment is occluded proximally with reconstitution at the C4 level. Skeleton: Mild cervical spine degenerative changes, greatest at C6-C7. Other neck: Unremarkable. Upper chest: No apical lung mass. Review of the MIP images confirms the above findings CTA HEAD  Anterior circulation: Intracranial internal carotid arteries are patent with mild calcified plaque. Anterior and middle cerebral arteries are patent. Posterior circulation: There is decreased caliber of the intracranial left vertebral artery throughout its course. Appears to terminate at patent left PICA. Intracranial right vertebral artery is patent. Basilar is patent but there is low density within the lumen proximally. Superior cerebellar artery origins are patent. Bilateral posterior communicating arteries are present. Posterior cerebral arteries are patent. Venous sinuses: As permitted by contrast timing, patent. Review of the MIP images confirms the above findings CT Brain Perfusion Findings: CBF (<30%) Volume: 13mL Perfusion (Tmax>6.0s) volume: 71mL Mismatch Volume: 84mL Infarction Location: None. IMPRESSION: Abnormal extracranial right vertebral artery with limited opacification proximally and reconstitution at the C4 level. May reflect high-grade origin stenosis. Right PICA or like AICA origins are not identified. Basilar artery is patent but there is question of nonocclusive clot proximally. Noncalcified plaque at the right ICA origin causes 65% stenosis. Mixed plaque at the left ICA origin causes less than 50% stenosis. Diffuse mild narrowing of the intracranial left vertebral artery, which terminates as a PICA. Perfusion imaging demonstrates no evidence of core infarction or penumbra, but there is limited evaluation of the posterior fossa. Initial results were provided by telephone at the  time of interpretation on 08/29/2021 at 10:20 am to provider [facility] , who verbally acknowledged these results. Electronically Signed   By: Guadlupe Spanish M.D.   On: 08/29/2021 10:44   ECHOCARDIOGRAM COMPLETE  Result Date: 08/30/2021    ECHOCARDIOGRAM REPORT   Patient Name:   DERREN Dellis Date of Exam: 08/30/2021 Medical Rec #:  [id number]  Height:       72.0 in Accession #:    [id number] Weight:       202.8 lb Date of  Birth:  September 18, 1956   BSA:          2.143 m Patient Age:    65 years   BP:           155/93 mmHg Patient Gender: M          HR:           76 bpm. Exam Location:  Inpatient Procedure: 2D Echo, Color Doppler and Cardiac Doppler Indications:    Eval for PFO  History:        Patient has no prior history of Echocardiogram examinations.  Sonographer:    Cleatis Polka Referring Phys: [id number] KRISTI KEHOE IMPRESSIONS  1. Left ventricular ejection fraction, by estimation, is 60 to 65%. The left ventricle has normal function. The left ventricle has no regional wall motion abnormalities. Left ventricular diastolic parameters are consistent with Grade II diastolic dysfunction (pseudonormalization).  2. Right ventricular systolic function is normal. The right ventricular size is normal. Tricuspid regurgitation signal is inadequate for assessing PA pressure.  3. The mitral valve is normal in structure. No evidence of mitral valve regurgitation. No evidence of mitral stenosis.  4. The aortic valve is tricuspid. Aortic valve regurgitation is not visualized. Mild aortic valve sclerosis is present, with no evidence of aortic valve stenosis.  5. The inferior vena cava is normal in size with greater than 50% respiratory variability, suggesting right atrial pressure of 3 mmHg. FINDINGS  Left Ventricle: Left ventricular ejection fraction, by estimation, is 60 to 65%. The left ventricle has normal function. The left ventricle has no regional wall motion abnormalities. The left ventricular internal cavity size was normal in size. There is  no left ventricular hypertrophy. Left ventricular diastolic parameters are consistent with Grade II diastolic dysfunction (pseudonormalization). Right Ventricle: The right ventricular size is normal. No increase in right ventricular wall thickness. Right ventricular systolic function is normal. Tricuspid regurgitation signal is inadequate for assessing PA pressure. Left Atrium: Left atrial size was normal  in size. Right Atrium: Right atrial size was normal in size. Pericardium: There is no evidence of pericardial effusion. Mitral Valve: The mitral valve is normal in structure. No evidence of mitral valve regurgitation. No evidence of mitral valve stenosis. Tricuspid Valve: The tricuspid valve is normal in structure. Tricuspid valve regurgitation is not demonstrated. Aortic Valve: The aortic valve is tricuspid. Aortic valve regurgitation is not visualized. Mild aortic valve sclerosis is present, with no evidence of aortic valve stenosis. Aortic valve peak gradient measures 9.4 mmHg. Pulmonic Valve: The pulmonic valve was normal in structure. Pulmonic valve regurgitation is trivial. Aorta: The aortic root is normal in size and structure. Venous: The inferior vena cava is normal in size with greater than 50% respiratory variability, suggesting right atrial pressure of 3 mmHg. IAS/Shunts: No atrial level shunt detected by color flow Doppler.  LEFT VENTRICLE PLAX 2D LVIDd:         4.40 cm      Diastology LVIDs:  2.80 cm      LV e' medial:    7.07 cm/s LV PW:         1.00 cm      LV E/e' medial:  8.9 LV IVS:        1.00 cm      LV e' lateral:   6.85 cm/s LVOT diam:     2.10 cm      LV E/e' lateral: 9.2 LV SV:         84 LV SV Index:   39 LVOT Area:     3.46 cm  LV Volumes (MOD) LV vol d, MOD A2C: 96.1 ml LV vol d, MOD A4C: 107.0 ml LV vol s, MOD A2C: 43.0 ml LV vol s, MOD A4C: 44.4 ml LV SV MOD A2C:     53.1 ml LV SV MOD A4C:     107.0 ml LV SV MOD BP:      58.1 ml RIGHT VENTRICLE             IVC RV Basal diam:  2.60 cm     IVC diam: 1.70 cm RV Mid diam:    2.50 cm RV S prime:     15.40 cm/s TAPSE (M-mode): 2.5 cm LEFT ATRIUM           Index        RIGHT ATRIUM           Index LA diam:      3.60 cm 1.68 cm/m   RA Area:     14.90 cm LA Vol (A2C): 37.2 ml 17.36 ml/m  RA Volume:   29.30 ml  13.67 ml/m LA Vol (A4C): 50.2 ml 23.42 ml/m  AORTIC VALVE AV Area (Vmax): 2.94 cm AV Vmax:        153.00 cm/s AV Peak Grad:    9.4 mmHg LVOT Vmax:      130.00 cm/s LVOT Vmean:     73.000 cm/s LVOT VTI:       0.243 m  AORTA Ao Root diam: 2.90 cm Ao Asc diam:  3.40 cm MITRAL VALVE MV Area (PHT): 3.31 cm    SHUNTS MV Decel Time: 229 msec    Systemic VTI:  0.24 m MV E velocity: 62.90 cm/s  Systemic Diam: 2.10 cm MV A velocity: 67.00 cm/s MV E/A ratio:  0.94 Dalton McleanMD Electronically signed by Wilfred Lacy Signature Date/Time: 08/30/2021/2:45:21 PM    Final    CT HEAD CODE STROKE WO CONTRAST  Result Date: 08/29/2021 CLINICAL DATA:  Code stroke.  Left-sided weakness EXAM: CT HEAD WITHOUT CONTRAST TECHNIQUE: Contiguous axial images were obtained from the base of the skull through the vertex without intravenous contrast. COMPARISON:  None. FINDINGS: Brain: There is no acute intracranial hemorrhage, mass effect, or edema. Gray-white differentiation is preserved. There is an age-indeterminate small infarct of the right inferior cerebellum. Ventricles and sulci are normal in size and configuration. No extra-axial collection. Vascular: No hyperdense vessel. Intracranial atherosclerotic calcification at the skull base. Skull: Unremarkable. Sinuses/Orbits: Lobular mucosal thickening. Orbits are unremarkable. Other: Mastoid air cells are clear. ASPECTS [facility] Stroke Program Early CT Score) - Ganglionic level infarction (caudate, lentiform nuclei, internal capsule, insula, M1-M3 cortex): 7 - Supraganglionic infarction (M4-M6 cortex): 3 Total score (0-10 with 10 being normal): 10 IMPRESSION: There is no acute intracranial hemorrhage. ASPECT score is 10. 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 These results were communicated to Dr. Wilford Corner at 9:44 am on 08/29/2021 by text page via the  [employer] messaging system. Electronically Signed   By: Guadlupe Spanish M.D.   On: 08/29/2021 09:46   CT ANGIO HEAD NECK W WO CM (CODE STROKE)  Result Date: 08/29/2021 CLINICAL DATA:  Neuro deficit, acute, stroke suspected EXAM: CT ANGIOGRAPHY HEAD  AND NECK CT PERFUSION BRAIN TECHNIQUE: Multidetector CT imaging of the head and neck was performed using the standard protocol during bolus administration of intravenous contrast. Multiplanar CT image reconstructions and MIPs were obtained to evaluate the vascular anatomy. Carotid stenosis measurements (when applicable) are obtained utilizing NASCET criteria, using the distal internal carotid diameter as the denominator. Multiphase CT imaging of the brain was performed following IV bolus contrast injection. Subsequent parametric perfusion maps were calculated using RAPID software. CONTRAST:  100 mL Omnipaque 350 COMPARISON:  None. FINDINGS: CTA NECK Aortic arch: Great vessel origins are patent. Right carotid system: Patent. Noncalcified plaque at the ICA origin causes 65% stenosis. Left carotid system: Patent. Mixed plaque at the ICA origin causes less than 50% stenosis. Vertebral arteries: Left vertebral artery is patent. Absent opacification of the proximal right vertebral artery at the origin with minimal reconstitution of the V1 segment followed by reocclusion. The V2 segment is occluded proximally with reconstitution at the C4 level. Skeleton: Mild cervical spine degenerative changes, greatest at C6-C7. Other neck: Unremarkable. Upper chest: No apical lung mass. Review of the MIP images confirms the above findings CTA HEAD Anterior circulation: Intracranial internal carotid arteries are patent with mild calcified plaque. Anterior and middle cerebral arteries are patent. Posterior circulation: There is decreased caliber of the intracranial left vertebral artery throughout its course. Appears to terminate at patent left PICA. Intracranial right vertebral artery is patent. Basilar is patent but there is low density within the lumen proximally. Superior cerebellar artery origins are patent. Bilateral posterior communicating arteries are present. Posterior cerebral arteries are patent. Venous sinuses: As permitted  by contrast timing, patent. Review of the MIP images confirms the above findings CT Brain Perfusion Findings: CBF (<30%) Volume: 39mL Perfusion (Tmax>6.0s) volume: 20mL Mismatch Volume: 52mL Infarction Location: None. IMPRESSION: Abnormal extracranial right vertebral artery with limited opacification proximally and reconstitution at the C4 level. May reflect high-grade origin stenosis. Right PICA or like AICA origins are not identified. Basilar artery is patent but there is question of nonocclusive clot proximally. Noncalcified plaque at the right ICA origin causes 65% stenosis. Mixed plaque at the left ICA origin causes less than 50% stenosis. Diffuse mild narrowing of the intracranial left vertebral artery, which terminates as a PICA. Perfusion imaging demonstrates no evidence of core infarction or penumbra, but there is limited evaluation of the posterior fossa. Initial results were provided by telephone at the time of interpretation on 08/29/2021 at 10:20 am to provider [facility] , who verbally acknowledged these results. Electronically Signed   By: Guadlupe Spanish M.D.   On: 08/29/2021 10:44    Labs:  CBC: Recent Labs    08/29/21 0932 08/29/21 0934 08/29/21 2340 08/31/21 0409  WBC 11.0*  --  12.2* 13.7*  HGB 13.7 14.3 14.6 12.8*  HCT 41.0 42.0 43.7 38.2*  PLT 378  --  284 308    COAGS: Recent Labs    08/29/21 0932 08/29/21 2340  INR 1.0 1.0  APTT 28  --     BMP: Recent Labs    08/29/21 0932 08/29/21 0934 08/30/21 0412 08/31/21 0409  NA 136 139 134* 134*  K 3.6 3.5 3.3* 3.3*  CL 108 107 103 104  CO2 17*  --  22 21*  GLUCOSE 183* 188* 128* 133*  BUN 10 10 7* 9  CALCIUM 9.0  --  8.9 8.0*  CREATININE 1.03 0.90 0.74 0.75  GFRNONAA >60  --  >60 >60    LIVER FUNCTION TESTS: Recent Labs    08/29/21 0932  BILITOT 0.8  AST 22  ALT 15  ALKPHOS 59  PROT 6.4*  ALBUMIN 3.7    Assessment and Plan:  Pt presented to [facility] ED via EMS as Code Stroke 08/29/21 c/o left-sided  weakness and facial droop. Pt reports that he noticed dizziness and headaches 10/31-11/1 but symptoms resolved. 08/29/21, his symptoms returned while he was using the restroom with tingling to left-sided face, arm and leg.  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 Corliss Skains.   Today pt reports right side facial numbness and well as right sided hearing loss. He states that he noticed this just prior to his intervention yesterday. He adds that he has blurred vision, worse to left eye. He c/o dysphagia and no appetite, stating that he just wants to sleep.    IR to continue to follow. Please call IR if there are questions or concerns.    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

## 2021-08-31 NOTE — Telephone Encounter (Signed)
Pt is scheduled to establish with new Gen Cardiologist Dr. Izora Ribas on 11/06/21 at 1100.  Pt made aware of appt date and time by Scheduling dept.  Staff message sent to Andee Lineman and Kelle Darting, to get pt set up to receive 30 day event monitor, as ordered and indicated in this message per Azalee Course PA-C.

## 2021-08-31 NOTE — Progress Notes (Signed)
Occupational Therapy Treatment Note  Able to progress OOB to chair with Max A +2. Recommend nsg transfer pt using +2 toward R side using squat pivot technique or use Stedy. Pt to continue to use tape on L lens. Pt demonstrating good insight and awareness into the severity of his deficits and how it impacts him functionally. Appropriately upset about his current situation. Excellent CIR candidate. Will continue to follow acutely.    08/31/21 1330  OT Visit Information  Last OT Received On 08/31/21  Assistance Needed +2  PT/OT/SLP Co-Evaluation/Treatment Yes  Reason for Co-Treatment Complexity of the patient's impairments (multi-system involvement);To address functional/ADL transfers;For patient/therapist safety  OT goals addressed during session ADL's and self-care  History of Present Illness 65 y/o male presented to ED on 11/2 for R facial droop, R sided weakness, headache, and dizziness. 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 Plan for TEE 11/4. PMH: HTN, HLD, PVD  Precautions  Precautions Fall  Precaution Comments diplopia, Ataxic R; L side numb  Pain Assessment  Faces Pain Scale 2  Pain Location discomfort from dizziness  Cognition  Arousal/Alertness Awake/alert  Behavior During Therapy Flat affect  Overall Cognitive Status Impaired/Different from baseline  Area of Impairment Attention;Following commands;Safety/judgement;Awareness;Problem solving  Current Attention Level Sustained  Following Commands Follows one step commands with increased time  Safety/Judgement Decreased awareness of safety;Decreased awareness of deficits  Awareness Emergent  Problem Solving Slow processing;Difficulty sequencing;Requires verbal cues;Requires tactile cues  General Comments Will further assess; when talking with him, he demonstrated an echolalia at times instead  of answering quesitons  Upper Extremity Assessment  RUE Deficits / Details ROM adn strength WFL; apparent sensory motor impariment leading to ataxic movements  RUE Coordination decreased fine motor;decreased gross motor  LUE Deficits / Details ROM most likely limited by inability to feel; Strength  - weaker proximally but funcitonal; overall 4/5 throughout  LUE Sensation decreased light touch;decreased proprioception  LUE Coordination decreased fine motor;decreased gross motor  Vision- Assessment  Vision Assessment? Yes  Eye Alignment Impaired (comment)  Ocular Range of Motion Restricted on the right  Alignment/Gaze Preference Head tilt;Gaze right  Tracking/Visual Pursuits Right eye does not track laterally;Decreased smoothness of horizontal tracking;Decreased smoothness of vertical tracking  Saccades Decreased speed of saccadic movement;Impaired - to be further tested in functional context;Additional head turns occurred during testing  Diplopia Assessment Disappears with one eye closed;Objects split side to side;Present all the time/all directions  Depth Perception Overshoots;Undershoots  ADL  Overall ADL's  Needs assistance/impaired  Eating/Feeding NPO  Grooming Maximal assistance  Grooming Details (indicate cue type and reason) brushing teeth with toothette and suction; difficulty controlling movement patterns  Upper Body Bathing Maximal assistance  Lower Body Bathing Maximal assistance;Sit to/from stand  Upper Body Dressing  Maximal assistance;Total assistance  Lower Body Dressing Total assistance  Toilet Transfer Maximal assistance;+2 for physical assistance  Toileting- Clothing Manipulation and Hygiene Total assistance  Functional mobility during ADLs Maximal assistance;+2 for physical assistance  General ADL Comments hand over hadn to brush teeth with toothette secondary to ataxia; wife present and  educated on how to assist pt with self care rather than doing everything for him   Bed Mobility  Overal bed mobility Needs Assistance  Bed Mobility Supine to Sit  Supine to sit Mod assist  General bed mobility comments cues for sequencing. modA to guide LEs to EOB and trunk  elevation. Dizziness reported upon sitting EOB. Cues for focusing on object  Transfers  Overall transfer level Needs assistance  Equipment used 2 person hand held assist  Transfers Sit to/from [employer] Transfers  Sit to Stand Mod assist;+2 physical assistance;+2 safety/equipment  Stand pivot transfers Max assist;+2 physical assistance;+2 safety/equipment  General transfer comment modA+2 to stand from EOB and maxA+2 to take pivotal steps towards recliner. Cues for stepping with LLE due to patient leaving leg behind demonstrating inattention  Balance  Overall balance assessment Needs assistance  Sitting-balance support Bilateral upper extremity supported;Feet supported  Sitting balance-Leahy Scale Poor  Sitting balance - Comments min-modA to maintain sitting balance initially progressing to min guard briefly. Mild truncal ataxia noted  Standing balance support Bilateral upper extremity supported;During functional activity  Standing balance-Leahy Scale Zero  Standing balance comment reliant on external assist to maintain standing balance  Exercises  Exercises Other exercises  Other Exercises  Other Exercises gaze stabilization  Other Exercises smooth pursuits - reviewed exercises  OT - End of Session  Equipment Utilized During Treatment Gait belt  Activity Tolerance Patient tolerated treatment well;Other (comment) (Session limimted by nsg pulling A-line)  Patient left in bed  Nurse Communication Mobility status  OT Assessment/Plan  OT Visit Diagnosis Unsteadiness on feet (R26.81);Other abnormalities of gait and mobility (R26.89);Muscle weakness (generalized) (M62.81);Low vision, both eyes (H54.2);Other symptoms and signs involving the nervous system (R29.898);Other symptoms and signs  involving cognitive function;Dizziness and giddiness (R42);Pain  Pain - part of body  (general discomfort)  OT Frequency (ACUTE ONLY) Min 3X/week (visioni involved)  Recommendations for Other Services Rehab consult  Follow Up Recommendations Acute inpatient rehab (3hours/day)  Assistance recommended at discharge Frequent or constant Supervision/Assistance  OT Equipment BSC;Hospital bed;Wheelchair (measurements OT);Wheelchair cushion (measurements OT)  AM-PAC OT "6 Clicks" Daily Activity Outcome Measure (Version 2)  Help from another person eating meals? 1  Help from another person taking care of personal grooming? 2  Help from another person toileting, which includes using toliet, bedpan, or urinal? 1  Help from another person bathing (including washing, rinsing, drying)? 2  Help from another person to put on and taking off regular upper body clothing? 2  Help from another person to put on and taking off regular lower body clothing? 1  6 Click Score 9  Progressive Mobility  What is the highest level of mobility based on the progressive mobility assessment? Level 2 (Chairfast) - Balance while sitting on edge of bed and cannot stand  Mobility Out of bed to chair with meals  OT Goal Progression  Progress towards OT goals Progressing toward goals  Acute Rehab OT Goals  Patient Stated Goal to get better  OT Goal Formulation With patient/family  Time For Goal Achievement 09/14/21  Potential to Achieve Goals Good  ADL Goals  Pt Will Perform Eating with min assist  Pt Will Perform Grooming with min assist  Pt Will Perform Upper Body Bathing with min assist;bed level  Pt Will Perform Lower Body Bathing with mod assist;bed level  Pt Will Transfer to Toilet with mod assist;bedside commode;squat pivot transfer  Additional ADL Goal #1 wife/pt will demonstrate abilityto use of partial occlusion L lens to improve functional vision  OT Time Calculation  OT Start Time (ACUTE ONLY) 1113  OT Stop  Time (ACUTE ONLY) 1149  OT Time Calculation (min) 36 min  OT General Charges  $OT Visit 1 Visit  OT Treatments  $Self Care/Home Management  8-22 mins  Perception  Perception  (will  further assess)  Luisa Dago, OT/L   Acute OT Clinical Specialist Acute Rehabilitation Services Pager [phone] Office [phone]

## 2021-08-31 NOTE — Transfer of Care (Signed)
Immediate Anesthesia Transfer of Care Note  Patient: Maurice Little  Procedure(s) Performed: TRANSESOPHAGEAL ECHOCARDIOGRAM (TEE) BUBBLE STUDY  Patient Location: PACU  Anesthesia Type:MAC  Level of Consciousness: awake, alert  and oriented  Airway & Oxygen Therapy: Patient Spontanous Breathing and Patient connected to nasal cannula oxygen  Post-op Assessment: Report given to RN and Post -op Vital signs reviewed and stable  Post vital signs: Reviewed and stable  Last Vitals:  Vitals Value Taken Time  BP 158/100 08/31/21 1451  Temp 36.7 C 08/31/21 1450  Pulse 84 08/31/21 1457  Resp 20 08/31/21 1457  SpO2 97 % 08/31/21 1457  Vitals shown include unvalidated device data.  Last Pain:  Vitals:   08/31/21 1450  TempSrc:   PainSc: 0-No pain         Complications: No notable events documented.

## 2021-08-31 NOTE — Progress Notes (Signed)
  Echocardiogram 2D Echocardiogram has been performed.  Roosvelt Maser F 08/31/2021, 3:34 PM

## 2021-08-31 NOTE — Anesthesia Procedure Notes (Signed)
Procedure Name: MAC Date/Time: 08/31/2021 2:15 PM Performed by: Erick Colace, CRNA Pre-anesthesia Checklist: Patient identified, Suction available, Emergency Drugs available, Patient being monitored and Timeout performed Patient Re-evaluated:Patient Re-evaluated prior to induction Oxygen Delivery Method: Nasal cannula Preoxygenation: Pre-oxygenation with 100% oxygen Induction Type: IV induction

## 2021-08-31 NOTE — Anesthesia Preprocedure Evaluation (Addendum)
Anesthesia Evaluation  Patient identified by MRN, date of birth, ID band Patient awake    Reviewed: Allergy & Precautions, NPO status , Patient's Chart, lab work & pertinent test results  Airway Mallampati: II  TM Distance: >3 FB Neck ROM: Full    Dental no notable dental hx. (+) Poor Dentition, Dental Advisory Given   Pulmonary neg pulmonary ROS, former smoker,    Pulmonary exam normal breath sounds clear to auscultation       Cardiovascular hypertension, + Peripheral Vascular Disease  Normal cardiovascular exam Rhythm:Regular Rate:Normal  TTE 2022 1. Left ventricular ejection fraction, by estimation, is 60 to 65%. The  left ventricle has normal function. The left ventricle has no regional  wall motion abnormalities. Left ventricular diastolic parameters are  consistent with Grade II diastolic  dysfunction (pseudonormalization).  2. Right ventricular systolic function is normal. The right ventricular  size is normal. Tricuspid regurgitation signal is inadequate for assessing  PA pressure.  3. The mitral valve is normal in structure. No evidence of mitral valve  regurgitation. No evidence of mitral stenosis.  4. The aortic valve is tricuspid. Aortic valve regurgitation is not  visualized. Mild aortic valve sclerosis is present, with no evidence of  aortic valve stenosis.  5. The inferior vena cava is normal in size with greater than 50%  respiratory variability, suggesting right atrial pressure of 3 mmHg.    Neuro/Psych CVA (right sided facial droop, left sided weakness), Residual Symptoms negative psych ROS   GI/Hepatic negative GI ROS, Neg liver ROS,   Endo/Other  negative endocrine ROS  Renal/GU negative Renal ROS  negative genitourinary   Musculoskeletal negative musculoskeletal ROS (+)   Abdominal   Peds  Hematology negative hematology ROS (+)   Anesthesia Other Findings Presented as code stroke on  08/29/21  Reproductive/Obstetrics                            Anesthesia Physical Anesthesia Plan  ASA: 3  Anesthesia Plan: MAC   Post-op Pain Management:    Induction: Intravenous  PONV Risk Score and Plan: Propofol infusion and Treatment may vary due to age or medical condition  Airway Management Planned: Natural Airway  Additional Equipment:   Intra-op Plan:   Post-operative Plan:   Informed Consent: I have reviewed the patients History and Physical, chart, labs and discussed the procedure including the risks, benefits and alternatives for the proposed anesthesia with the patient or authorized representative who has indicated his/her understanding and acceptance.     Dental advisory given  Plan Discussed with: CRNA  Anesthesia Plan Comments:         Anesthesia Quick Evaluation

## 2021-08-31 NOTE — Evaluation (Signed)
Physical Therapy Evaluation Patient Details Name: Maurice Little MRN: [id number] DOB: 15-Feb-1956 Today's Date: 08/31/2021  History of Present Illness  65 y/o male presented to ED on 11/2 for R facial droop, R sided weakness, headache, and dizziness. 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 Plan for TEE 11/4. PMH: HTN, HLD, PVD  Clinical Impression  PTA, patient lives with wife and was independent. Patient presents with L weakness, L inattention, decreased activity tolerance, impaired balance, impaired coordination, and impaired cognition. Patient requires modA for bed mobility and modA+2 for sit to stand transfer. Patient able to take pivotal steps towards chair with maxA+2 due to ataxia and weakness also demonstrates L inattention with leaving L foot behind. Patient will benefit from skilled PT services during acute stay to address listed deficits. Recommend CIR following discharge to maximize functional mobility, safety, and decrease burden of care.      Recommendations for follow up therapy are one component of a multi-disciplinary discharge planning process, led by the attending physician.  Recommendations may be updated based on patient status, additional functional criteria and insurance authorization.  Follow Up Recommendations Acute inpatient rehab (3hours/day)    Assistance Recommended at Discharge Frequent or constant Supervision/Assistance  Functional Status Assessment Patient has had a recent decline in their functional status and demonstrates the ability to make significant improvements in function in a reasonable and predictable amount of time.  Equipment Recommendations  Other (comment) (TBD)    Recommendations for Other Services Rehab consult     Precautions / Restrictions Precautions Precautions: Fall Precaution Comments: diplopia, L  inattention Restrictions Weight Bearing Restrictions: No      Mobility  Bed Mobility Overal bed mobility: Needs Assistance Bed Mobility: Supine to Sit     Supine to sit: Mod assist     General bed mobility comments: cues for sequencing. modA to guide LEs to EOB and trunk elevation. Dizziness reported upon sitting EOB. Cues for focusing on object    Transfers Overall transfer level: Needs assistance Equipment used: 2 person hand held assist Transfers: Sit to/from [employer] Transfers Sit to Stand: Mod assist;+2 physical assistance;+2 safety/equipment Stand pivot transfers: Max assist;+2 physical assistance;+2 safety/equipment         General transfer comment: modA+2 to stand from EOB and maxA+2 to take pivotal steps towards recliner. Cues for stepping with LLE due to patient leaving leg behind demonstrating inattention    Ambulation/Gait                Stairs            Wheelchair Mobility    Modified Rankin (Stroke Patients Only) Modified Rankin (Stroke Patients Only) Pre-Morbid Rankin Score: No symptoms Modified Rankin: Severe disability     Balance Overall balance assessment: Needs assistance Sitting-balance support: Bilateral upper extremity supported;Feet supported Sitting balance-Leahy Scale: Poor Sitting balance - Comments: min-modA to maintain sitting balance initially progressing to min guard briefly. Mild truncal ataxia noted   Standing balance support: Bilateral upper extremity supported;During functional activity Standing balance-Leahy Scale: Poor Standing balance comment: reliant on external assist to maintain standing balance                             Pertinent Vitals/Pain Pain Assessment: No/denies pain    Home Living Family/patient expects to be discharged to:: Private residence Living Arrangements: Spouse/significant  other Available Help at Discharge: Family;Available 24 hours/day Type of Home: House Home  Access: Stairs to enter Entrance Stairs-Rails: Doctor, general practice of Steps: 2   Home Layout: One level Home Equipment: Shower seat - built in;Hand held shower head      Prior Function Prior Level of Function : Independent/Modified Independent;Working/employed;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, 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   Dominant Hand: Right    Extremity/Trunk Assessment   Upper Extremity Assessment Upper Extremity Assessment: Defer to OT evaluation    Lower Extremity Assessment Lower Extremity Assessment: RLE deficits/detail;LLE deficits/detail RLE Deficits / Details: strength WFL, ataxia noted RLE Sensation: WNL RLE Coordination: decreased fine motor;decreased gross motor LLE Deficits / Details: grossly 4/5 LLE Sensation: decreased light touch;decreased proprioception LLE Coordination: decreased fine motor;decreased gross motor    Cervical / Trunk Assessment Cervical / Trunk Assessment: Normal  Communication   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)  Cognition Arousal/Alertness: Awake/alert Behavior During Therapy: Flat affect Overall Cognitive Status: Impaired/Different from baseline Area of Impairment: Attention;Following commands;Safety/judgement;Awareness;Problem solving                   Current Attention Level: Sustained   Following Commands: Follows one step commands with increased time Safety/Judgement: Decreased awareness of safety;Decreased awareness of deficits Awareness: Emergent Problem Solving: Slow processing;Difficulty sequencing;Requires verbal cues;Requires tactile cues          General Comments      Exercises     Assessment/Plan    PT Assessment Patient needs continued PT services  PT Problem List Decreased strength;Decreased activity tolerance;Decreased balance;Decreased mobility;Decreased coordination;Decreased cognition;Decreased knowledge of use of DME;Decreased safety  awareness;Decreased knowledge of precautions;Impaired sensation       PT Treatment Interventions DME instruction;Gait training;Stair training;Functional mobility training;Therapeutic activities;Therapeutic exercise;Balance training;Neuromuscular re-education;Patient/family education    PT Goals (Current goals can be found in the Care Plan section)  Acute Rehab PT Goals Patient Stated Goal: to get better PT Goal Formulation: With patient/family Time For Goal Achievement: 09/14/21 Potential to Achieve Goals: Good    Frequency Min 4X/week   Barriers to discharge        Co-evaluation PT/OT/SLP Co-Evaluation/Treatment: Yes Reason for Co-Treatment: For patient/therapist safety;To address functional/ADL transfers PT goals addressed during session: Mobility/safety with mobility;Balance         AM-PAC PT "6 Clicks" Mobility  Outcome Measure Help needed turning from your back to your side while in a flat bed without using bedrails?: A Lot Help needed moving from lying on your back to sitting on the side of a flat bed without using bedrails?: A Lot Help needed moving to and from a bed to a chair (including a wheelchair)?: Total Help needed standing up from a chair using your arms (e.g., wheelchair or bedside chair)?: Total Help needed to walk in hospital room?: Total Help needed climbing 3-5 steps with a railing? : Total 6 Click Score: 8    End of Session Equipment Utilized During Treatment: Gait belt Activity Tolerance: Patient tolerated treatment well Patient left: in chair;with call bell/phone within reach;with chair alarm set;with family/visitor present Nurse Communication: Mobility status;Need for lift equipment PT Visit Diagnosis: Unsteadiness on feet (R26.81);Muscle weakness (generalized) (M62.81);Ataxic gait (R26.0);Other abnormalities of gait and mobility (R26.89);Other symptoms and signs involving the nervous system (R29.898);Dizziness and giddiness (R42)    Time: 1031  (1112)-1049 (1133) PT Time Calculation (min) (ACUTE ONLY): 18 min  Charges:   PT Evaluation $PT Eval Moderate Complexity: 1 Mod          Maurice Kosh A. Dan Humphreys PT, DPT Acute Rehabilitation Services Pager [phone] Office [phone]   Viviann Spare 08/31/2021, 12:55 PM

## 2021-09-01 ENCOUNTER — Inpatient Hospital Stay (HOSPITAL_COMMUNITY): Payer: 59

## 2021-09-01 DIAGNOSIS — I63111 Cerebral infarction due to embolism of right vertebral artery: Secondary | ICD-10-CM | POA: Diagnosis not present

## 2021-09-01 LAB — GLUCOSE, CAPILLARY
Glucose-Capillary: 115 mg/dL — ABNORMAL HIGH (ref 70–99)
Glucose-Capillary: 131 mg/dL — ABNORMAL HIGH (ref 70–99)
Glucose-Capillary: 105 mg/dL — ABNORMAL HIGH (ref 70–99)
Glucose-Capillary: 103 mg/dL — ABNORMAL HIGH (ref 70–99)
Glucose-Capillary: 112 mg/dL — ABNORMAL HIGH (ref 70–99)
Glucose-Capillary: 108 mg/dL — ABNORMAL HIGH (ref 70–99)

## 2021-09-01 LAB — BASIC METABOLIC PANEL
Anion gap: 7 (ref 5–15)
BUN: 9 mg/dL (ref 8–23)
CO2: 24 mmol/L (ref 22–32)
Calcium: 8.7 mg/dL — ABNORMAL LOW (ref 8.9–10.3)
Chloride: 103 mmol/L (ref 98–111)
Creatinine, Ser: 0.71 mg/dL (ref 0.61–1.24)
GFR, Estimated: >60 mL/min (ref >60)
Glucose, Bld: 110 mg/dL — ABNORMAL HIGH (ref 70–99)
Potassium: 3.1 mmol/L — ABNORMAL LOW (ref 3.5–5.1)
Sodium: 134 mmol/L — ABNORMAL LOW (ref 135–145)

## 2021-09-01 LAB — CBC
HCT: 39 % (ref 39.0–52.0)
Hemoglobin: 13.2 g/dL (ref 13.0–17.0)
MCH: 31.9 pg (ref 26.0–34.0)
MCHC: 33.8 g/dL (ref 30.0–36.0)
MCV: 94.2 fL (ref 80.0–100.0)
Platelets: 273 10*3/uL (ref 150–400)
RBC: 4.14 MIL/uL — ABNORMAL LOW (ref 4.22–5.81)
RDW: 14.4 % (ref 11.5–15.5)
WBC: 13.6 10*3/uL — ABNORMAL HIGH (ref 4.0–10.5)
nRBC: 0 % (ref 0.0–0.2)

## 2021-09-01 MED ORDER — TAMSULOSIN HCL 0.4 MG PO CAPS
0.4000 mg | ORAL_CAPSULE | Freq: Every day | ORAL | Status: DC
  Administered 2021-09-01 – 2021-09-04 (×4): 0.4 mg via ORAL
  Filled 2021-09-01 (×4): qty 1

## 2021-09-01 MED ORDER — LABETALOL HCL 5 MG/ML IV SOLN
10.0000 mg | Freq: Once | INTRAVENOUS | Status: AC
  Administered 2021-09-01: 10 mg via INTRAVENOUS

## 2021-09-01 MED ORDER — POTASSIUM CHLORIDE 10 MEQ/100ML IV SOLN
10.0000 meq | INTRAVENOUS | Status: AC
  Administered 2021-09-01 (×4): 10 meq via INTRAVENOUS
  Filled 2021-09-01 (×4): qty 100

## 2021-09-01 NOTE — Evaluation (Signed)
Clinical/Bedside Swallow Evaluation Patient Details  Name: Maurice Little MRN: [id number] Date of Birth: [birth year]  Today's Date: 09/01/2021 Time: SLP Start Time (ACUTE ONLY): 1000 SLP Stop Time (ACUTE ONLY): 1015 SLP Time Calculation (min) (ACUTE ONLY): 15 min  Past Medical History:  Past Medical History:  Diagnosis Date   Hypertension    PAD (peripheral artery disease) (HCC)    Past Surgical History:  Past Surgical History:  Procedure Laterality Date   RADIOLOGY WITH ANESTHESIA N/A 08/30/2021   Procedure: IR WITH ANESTHESIA;  Surgeon: Julieanne Cotton, MD;  Location: [facility] OR;  Service: Radiology;  Laterality: N/A;   HPI:  Maurice Little presented as code stroke via EMS after he experienced left sided tingling prior to arrival.  His troubles began between 10/31-11/1, when he noted sudden onset dizziness and headache. He laid on the ground and these symptoms eventually subsided over the course of an hour, although they returned AM of admission day.  2 hours prior to arrival, he had been having a bowel movement when he suddenly felt left sided tingling "like neuropathy" of left face, arm, leg. On admission he reported it had been constant since onset without change in character. No weakness.   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 Small additional involvement of right brachium pontis and right pontomedullary junction. No hemorrhage or significant mass effect.  Mild chronic microvascular ischemic changes.  Patient reported he was working fulltime prior to this event.    Assessment / Plan / Recommendation  Clinical Impression  Clinical swallowing evaluation was completed using thin liquids via spoon, cup and straw, pureed material and dry solids in setting for admission due to stroke.  The patient nor his wife present reported trouble swallowing prior to admission.  RN stated he took his medications whole with liquids without obvious  issues.  Cranial nerve exam was completed and remkarable for right sided facial weakness that encompassed his upper and lower face.  Issues with tight eye closure on the right were seen. Lingual range of motion and strength appeared to be adequate.  Deviation was seen with jaw opening with good strength.   Decreased labial strength was seen.  Facial sensation was intact and he did not endorse a difference in sensation between the right and left side of his face. However, he reported bilateral tingling/numbness.  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  Mastication of dry solids appeared to be mildly slow with mild oral residue seen post swallow.  Anterior escape was seen from the right side given thin liquids.  Swallow trigger was appreciated to palpation.  Cough response was seen in resopnse to thin liquids via straw and during the [facility] Swallowing Protocol. Signs and symptoms of aspiration were not seen given single sips of thin liquids via assisted self fed cup sips.  Suggest a mechanical soft diet with thin liquids pending results of MBS to fully assess swallowing physiology and safety. SLP Visit Diagnosis: Dysphagia, unspecified (R13.10)    Aspiration Risk  Moderate aspiration risk    Diet Recommendation   Mechanical soft with thin liquids  Medication Administration: Whole meds with puree    Other  Recommendations Oral Care Recommendations: Oral care BID    Recommendations for follow up therapy are one component of a multi-disciplinary discharge planning process, led by the attending physician.  Recommendations may be updated based on patient status, additional functional criteria and insurance authorization.  Follow up Recommendations Other (comment) (TBD following  MBS)             Prognosis Prognosis for Safe Diet Advancement: Good      Swallow Study   General Date of Onset: 08/29/21 HPI: Maurice Little presented as code stroke via EMS after he experienced left sided  tingling prior to arrival.  His troubles began between 10/31-11/1, when he noted sudden onset dizziness and headache. He laid on the ground and these symptoms eventually subsided over the course of an hour, although they returned AM of admission day.  2 hours prior to arrival, he had been having a bowel movement when he suddenly felt left sided tingling "like neuropathy" of left face, arm, leg. On admission he reported it had been constant since onset without change in character. No weakness.   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 Small additional involvement of right brachium pontis and right pontomedullary junction. No hemorrhage or significant mass effect.  Mild chronic microvascular ischemic changes.  Patient reported he was wokring fulltime prior to this event. Type of Study: Bedside Swallow Evaluation Previous Swallow Assessment: None noted at [facility] Diet Prior to this Study: Other (Comment) (full liquids) Temperature Spikes Noted: No Respiratory Status: Room air History of Recent Intubation: No Behavior/Cognition: Alert;Cooperative;Pleasant mood Oral Cavity Assessment: Within Functional Limits Oral Care Completed by SLP: No Oral Cavity - Dentition: Adequate natural dentition Vision: Functional for self-feeding Self-Feeding Abilities: Able to feed self;Needs assist Patient Positioning: Upright in bed Baseline Vocal Quality: Low vocal intensity Volitional Swallow: Able to elicit    Oral/Motor/Sensory Function Overall Oral Motor/Sensory Function: Moderate impairment Facial ROM: Reduced right Facial Symmetry: Abnormal symmetry right;Suspected CN VII (facial) dysfunction Facial Strength: Reduced right;Suspected CN VII (facial) dysfunction Facial Sensation: Reduced right;Reduced left;Suspected CN V (Trigeminal) dysfunction Lingual ROM: Within Functional Limits Lingual Symmetry: Within Functional Limits Lingual Strength: Within  Functional Limits Mandible: Impaired   Ice Chips Ice chips: Not tested   Thin Liquid Thin Liquid: Impaired Presentation: Cup;Self Fed;Spoon;Straw Oral Phase Impairments: Reduced labial seal Oral Phase Functional Implications: Right anterior spillage Pharyngeal  Phase Impairments: Cough - Immediate    Nectar Thick Nectar Thick Liquid: Not tested   Honey Thick Honey Thick Liquid: Not tested   Puree Puree: Impaired Presentation: Spoon Oral Phase Impairments: Impaired mastication Oral Phase Functional Implications: Prolonged oral transit   Solid     Solid: Impaired Presentation: Self Fed Oral Phase Impairments: Impaired mastication Oral Phase Functional Implications: Oral residue;Prolonged oral transit     Dimas Aguas, MA, CCC-SLP Acute Rehab SLP [phone]  Fleet Contras 09/01/2021,11:18 AM

## 2021-09-01 NOTE — Progress Notes (Signed)
Pts BP elevated above 160 as per the goal, given 2 times labetol as per [facility] but not responding well, MD on call notified via  page

## 2021-09-01 NOTE — Progress Notes (Addendum)
Inpatient Rehab Admissions:  Inpatient Rehab Consult received.  I met with patient at the bedside for rehabilitation assessment and to discuss goals and expectations of an inpatient rehab admission.  Pt requested I contact  his wife, Peggye Fothergill. Called Jodie and left a message; awaiting return call. Will continue to follow.  ADDENDUM: Spoke with Jodie. Explained CIR goals and expectations. She acknowledged understanding.  She is interested in pt pursuing CIR. She confirmed she can provide 24/7 support for pt after discharge. Will continue to follow.  Signed: Gayland Curry, [place], [place] Admissions Coordinator [phone]

## 2021-09-01 NOTE — Progress Notes (Addendum)
STROKE TEAM PROGRESS NOTE   SUBJECTIVE (INTERVAL HISTORY) His wife is at the bedside.  Pt lying in bed, neuro stable, hemodynamically stable and in no acute distress. Still has left sided numbness, diplopia, nystagums and right hearing loss but no new symptoms. Patient and his wife report some improvement in symptoms since stenting was performed. His personality/behavior is returning back to normal.   OBJECTIVE Temp:  [97.9 F (36.6 C)-99.9 F (37.7 C)] 99.1 F (37.3 C) (11/05 0822) Pulse Rate:  [69-94] 77 (11/05 0915) Cardiac Rhythm: Normal sinus rhythm (11/05 0800) Resp:  [10-22] 15 (11/05 0915) BP: (122-180)/(68-111) 137/86 (11/05 0915) SpO2:  [91 %-99 %] 94 % (11/05 0915) Arterial Line BP: (95-177)/(63-91) 177/73 (11/04 1045)  Recent Labs  Lab 08/31/21 1709 08/31/21 1935 08/31/21 2322 09/01/21 0313 09/01/21 0807  GLUCAP 123* 132* 111* 115* 131*    Recent Labs  Lab 08/29/21 0932 08/29/21 0934 08/30/21 0412 08/31/21 0409 09/01/21 0311  NA 136 139 134* 134* 134*  K 3.6 3.5 3.3* 3.3* 3.1*  CL 108 107 103 104 103  CO2 17*  --  22 21* 24  GLUCOSE 183* 188* 128* 133* 110*  BUN 10 10 7* 9 9  CREATININE 1.03 0.90 0.74 0.75 0.71  CALCIUM 9.0  --  8.9 8.0* 8.7*    Recent Labs  Lab 08/29/21 0932  AST 22  ALT 15  ALKPHOS 59  BILITOT 0.8  PROT 6.4*  ALBUMIN 3.7    Recent Labs  Lab 08/29/21 0932 08/29/21 0934 08/29/21 2340 08/31/21 0409 09/01/21 0311  WBC 11.0*  --  12.2* 13.7* 13.6*  NEUTROABS 5.0  --   --   --   --   HGB 13.7 14.3 14.6 12.8* 13.2  HCT 41.0 42.0 43.7 38.2* 39.0  MCV 94.9  --  95.6 95.5 94.2  PLT 378  --  284 308 273    No results for input(s): CKTOTAL, CKMB, CKMBINDEX, TROPONINI in the last 168 hours. Recent Labs    08/29/21 2340  LABPROT 13.3  INR 1.0    No results for input(s): COLORURINE, LABSPEC, PHURINE, GLUCOSEU, HGBUR, BILIRUBINUR, KETONESUR, PROTEINUR, UROBILINOGEN, NITRITE, LEUKOCYTESUR in the last 72 hours.  Invalid  input(s): APPERANCEUR     Component Value Date/Time   CHOL 288 (H) 08/29/2021 2340   TRIG 82 08/29/2021 2340   HDL 52 08/29/2021 2340   CHOLHDL 5.5 08/29/2021 2340   VLDL 16 08/29/2021 2340   LDLCALC 220 (H) 08/29/2021 2340   Lab Results  Component Value Date   HGBA1C 5.5 08/29/2021      Component Value Date/Time   LABOPIA NONE DETECTED 08/30/2021 0751   COCAINSCRNUR NONE DETECTED 08/30/2021 0751   LABBENZ NONE DETECTED 08/30/2021 0751   AMPHETMU NONE DETECTED 08/30/2021 0751   THCU POSITIVE (A) 08/30/2021 0751   LABBARB NONE DETECTED 08/30/2021 0751    No results for input(s): ETH in the last 168 hours.   MR BRAIN WO CONTRAST  Result Date: 08/29/2021 CLINICAL DATA:  Stroke, follow up EXAM: MRI HEAD WITHOUT CONTRAST TECHNIQUE: Multiplanar, multiecho pulse sequences of the brain and surrounding structures were obtained without intravenous contrast. COMPARISON:  None. FINDINGS: Brain: Reduced diffusion is present in the inferior right cerebellum. Additional small foci of involvement in the right brachium pontis and right pontomedullary junction. No significant mass effect. No evidence of intracranial hemorrhage. Patchy foci of T2 hyperintensity in the supratentorial white matter are nonspecific but may reflect mild chronic microvascular ischemic changes. Ventricles and sulci are within normal  limits in size and configuration. Vascular: Major vessel flow voids at the skull base are preserved. Skull and upper cervical spine: Normal marrow signal is preserved. Sinuses/Orbits: Paranasal sinus mucosal thickening. Orbits are unremarkable. Other: Sella is unremarkable.  Mastoid air cells are clear. 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 Small additional involvement of right brachium pontis and right pontomedullary junction. No hemorrhage or significant mass effect. Mild chronic microvascular ischemic changes. Electronically Signed   By: Guadlupe Spanish M.D.   On: 08/29/2021  13:03   CT CEREBRAL PERFUSION W CONTRAST  Result Date: 08/29/2021 CLINICAL DATA:  Neuro deficit, acute, stroke suspected EXAM: CT ANGIOGRAPHY HEAD AND NECK CT PERFUSION BRAIN TECHNIQUE: Multidetector CT imaging of the head and neck was performed using the standard protocol during bolus administration of intravenous contrast. Multiplanar CT image reconstructions and MIPs were obtained to evaluate the vascular anatomy. Carotid stenosis measurements (when applicable) are obtained utilizing NASCET criteria, using the distal internal carotid diameter as the denominator. Multiphase CT imaging of the brain was performed following IV bolus contrast injection. Subsequent parametric perfusion maps were calculated using RAPID software. CONTRAST:  100 mL Omnipaque 350 COMPARISON:  None. FINDINGS: CTA NECK Aortic arch: Great vessel origins are patent. Right carotid system: Patent. Noncalcified plaque at the ICA origin causes 65% stenosis. Left carotid system: Patent. Mixed plaque at the ICA origin causes less than 50% stenosis. Vertebral arteries: Left vertebral artery is patent. Absent opacification of the proximal right vertebral artery at the origin with minimal reconstitution of the V1 segment followed by reocclusion. The V2 segment is occluded proximally with reconstitution at the C4 level. Skeleton: Mild cervical spine degenerative changes, greatest at C6-C7. Other neck: Unremarkable. Upper chest: No apical lung mass. Review of the MIP images confirms the above findings CTA HEAD Anterior circulation: Intracranial internal carotid arteries are patent with mild calcified plaque. Anterior and middle cerebral arteries are patent. Posterior circulation: There is decreased caliber of the intracranial left vertebral artery throughout its course. Appears to terminate at patent left PICA. Intracranial right vertebral artery is patent. Basilar is patent but there is low density within the lumen proximally. Superior cerebellar  artery origins are patent. Bilateral posterior communicating arteries are present. Posterior cerebral arteries are patent. Venous sinuses: As permitted by contrast timing, patent. Review of the MIP images confirms the above findings CT Brain Perfusion Findings: CBF (<30%) Volume: 68mL Perfusion (Tmax>6.0s) volume: 54mL Mismatch Volume: 74mL Infarction Location: None. IMPRESSION: Abnormal extracranial right vertebral artery with limited opacification proximally and reconstitution at the C4 level. May reflect high-grade origin stenosis. Right PICA or like AICA origins are not identified. Basilar artery is patent but there is question of nonocclusive clot proximally. Noncalcified plaque at the right ICA origin causes 65% stenosis. Mixed plaque at the left ICA origin causes less than 50% stenosis. Diffuse mild narrowing of the intracranial left vertebral artery, which terminates as a PICA. Perfusion imaging demonstrates no evidence of core infarction or penumbra, but there is limited evaluation of the posterior fossa. Initial results were provided by telephone at the time of interpretation on 08/29/2021 at 10:20 am to provider [facility] , who verbally acknowledged these results. Electronically Signed   By: Guadlupe Spanish M.D.   On: 08/29/2021 10:44   ECHOCARDIOGRAM COMPLETE  Result Date: 08/30/2021    ECHOCARDIOGRAM REPORT   Patient Name:   Maurice Little Date of Exam: 08/30/2021 Medical Rec #:  [id number]  Height:       72.0 in Accession #:  [id number] Weight:       202.8 lb Date of Birth:  October 09, 1956   BSA:          2.143 m Patient Age:    65 years   BP:           155/93 mmHg Patient Gender: M          HR:           76 bpm. Exam Location:  Inpatient Procedure: 2D Echo, Color Doppler and Cardiac Doppler Indications:    Eval for PFO  History:        Patient has no prior history of Echocardiogram examinations.  Sonographer:    Cleatis Polka Referring Phys: [id number] KRISTI KEHOE IMPRESSIONS  1. Left ventricular ejection  fraction, by estimation, is 60 to 65%. The left ventricle has normal function. The left ventricle has no regional wall motion abnormalities. Left ventricular diastolic parameters are consistent with Grade II diastolic dysfunction (pseudonormalization).  2. Right ventricular systolic function is normal. The right ventricular size is normal. Tricuspid regurgitation signal is inadequate for assessing PA pressure.  3. The mitral valve is normal in structure. No evidence of mitral valve regurgitation. No evidence of mitral stenosis.  4. The aortic valve is tricuspid. Aortic valve regurgitation is not visualized. Mild aortic valve sclerosis is present, with no evidence of aortic valve stenosis.  5. The inferior vena cava is normal in size with greater than 50% respiratory variability, suggesting right atrial pressure of 3 mmHg. FINDINGS  Left Ventricle: Left ventricular ejection fraction, by estimation, is 60 to 65%. The left ventricle has normal function. The left ventricle has no regional wall motion abnormalities. The left ventricular internal cavity size was normal in size. There is  no left ventricular hypertrophy. Left ventricular diastolic parameters are consistent with Grade II diastolic dysfunction (pseudonormalization). Right Ventricle: The right ventricular size is normal. No increase in right ventricular wall thickness. Right ventricular systolic function is normal. Tricuspid regurgitation signal is inadequate for assessing PA pressure. Left Atrium: Left atrial size was normal in size. Right Atrium: Right atrial size was normal in size. Pericardium: There is no evidence of pericardial effusion. Mitral Valve: The mitral valve is normal in structure. No evidence of mitral valve regurgitation. No evidence of mitral valve stenosis. Tricuspid Valve: The tricuspid valve is normal in structure. Tricuspid valve regurgitation is not demonstrated. Aortic Valve: The aortic valve is tricuspid. Aortic valve regurgitation  is not visualized. Mild aortic valve sclerosis is present, with no evidence of aortic valve stenosis. Aortic valve peak gradient measures 9.4 mmHg. Pulmonic Valve: The pulmonic valve was normal in structure. Pulmonic valve regurgitation is trivial. Aorta: The aortic root is normal in size and structure. Venous: The inferior vena cava is normal in size with greater than 50% respiratory variability, suggesting right atrial pressure of 3 mmHg. IAS/Shunts: No atrial level shunt detected by color flow Doppler.  LEFT VENTRICLE PLAX 2D LVIDd:         4.40 cm      Diastology LVIDs:         2.80 cm      LV e' medial:    7.07 cm/s LV PW:         1.00 cm      LV E/e' medial:  8.9 LV IVS:        1.00 cm      LV e' lateral:   6.85 cm/s LVOT diam:     2.10 cm  LV E/e' lateral: 9.2 LV SV:         84 LV SV Index:   39 LVOT Area:     3.46 cm  LV Volumes (MOD) LV vol d, MOD A2C: 96.1 ml LV vol d, MOD A4C: 107.0 ml LV vol s, MOD A2C: 43.0 ml LV vol s, MOD A4C: 44.4 ml LV SV MOD A2C:     53.1 ml LV SV MOD A4C:     107.0 ml LV SV MOD BP:      58.1 ml RIGHT VENTRICLE             IVC RV Basal diam:  2.60 cm     IVC diam: 1.70 cm RV Mid diam:    2.50 cm RV S prime:     15.40 cm/s TAPSE (M-mode): 2.5 cm LEFT ATRIUM           Index        RIGHT ATRIUM           Index LA diam:      3.60 cm 1.68 cm/m   RA Area:     14.90 cm LA Vol (A2C): 37.2 ml 17.36 ml/m  RA Volume:   29.30 ml  13.67 ml/m LA Vol (A4C): 50.2 ml 23.42 ml/m  AORTIC VALVE AV Area (Vmax): 2.94 cm AV Vmax:        153.00 cm/s AV Peak Grad:   9.4 mmHg LVOT Vmax:      130.00 cm/s LVOT Vmean:     73.000 cm/s LVOT VTI:       0.243 m  AORTA Ao Root diam: 2.90 cm Ao Asc diam:  3.40 cm MITRAL VALVE MV Area (PHT): 3.31 cm    SHUNTS MV Decel Time: 229 msec    Systemic VTI:  0.24 m MV E velocity: 62.90 cm/s  Systemic Diam: 2.10 cm MV A velocity: 67.00 cm/s MV E/A ratio:  0.94 Dalton McleanMD Electronically signed by Wilfred Lacy Signature Date/Time: 08/30/2021/2:45:21 PM     Final    CT HEAD CODE STROKE WO CONTRAST  Result Date: 08/29/2021 CLINICAL DATA:  Code stroke.  Left-sided weakness EXAM: CT HEAD WITHOUT CONTRAST TECHNIQUE: Contiguous axial images were obtained from the base of the skull through the vertex without intravenous contrast. COMPARISON:  None. FINDINGS: Brain: There is no acute intracranial hemorrhage, mass effect, or edema. Gray-white differentiation is preserved. There is an age-indeterminate small infarct of the right inferior cerebellum. Ventricles and sulci are normal in size and configuration. No extra-axial collection. Vascular: No hyperdense vessel. Intracranial atherosclerotic calcification at the skull base. Skull: Unremarkable. Sinuses/Orbits: Lobular mucosal thickening. Orbits are unremarkable. Other: Mastoid air cells are clear. ASPECTS [facility] Stroke Program Early CT Score) - Ganglionic level infarction (caudate, lentiform nuclei, internal capsule, insula, M1-M3 cortex): 7 - Supraganglionic infarction (M4-M6 cortex): 3 Total score (0-10 with 10 being normal): 10 IMPRESSION: There is no acute intracranial hemorrhage. ASPECT score is 10. 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 These results were communicated to Dr. Wilford Corner at 9:44 am on 08/29/2021 by text page via the [facility] messaging system. Electronically Signed   By: Guadlupe Spanish M.D.   On: 08/29/2021 09:46   VAS [place] LOWER EXTREMITY VENOUS (DVT)  Result Date: 08/31/2021  Lower Venous DVT Study Patient Name:  Maurice Little  Date of Exam:   08/31/2021 Medical Rec #: [id number]   Accession #:    [id number] Date of Birth: 1956-01-14    Patient Gender: M Patient Age:   75  years Exam Location:  [facility] Procedure:      VAS [place] LOWER EXTREMITY VENOUS (DVT) Referring Phys: Scheryl Marten XU --------------------------------------------------------------------------------  Indications: Stroke.  Comparison Study: no prior Performing Technologist: [place] Ponder RVS  Examination Guidelines: A  complete evaluation includes B-mode imaging, spectral Doppler, color Doppler, and power Doppler as needed of all accessible portions of each vessel. Bilateral testing is considered an integral part of a complete examination. Limited examinations for reoccurring indications may be performed as noted. The reflux portion of the exam is performed with the patient in reverse Trendelenburg.  +---------+---------------+---------+-----------+----------+--------------+ RIGHT    CompressibilityPhasicitySpontaneityPropertiesThrombus Aging +---------+---------------+---------+-----------+----------+--------------+ CFV      Full           Yes      Yes                                 +---------+---------------+---------+-----------+----------+--------------+ SFJ      Full                                                        +---------+---------------+---------+-----------+----------+--------------+ FV Prox  Full                                                        +---------+---------------+---------+-----------+----------+--------------+ FV Mid   Full                                                        +---------+---------------+---------+-----------+----------+--------------+ FV DistalFull                                                        +---------+---------------+---------+-----------+----------+--------------+ PFV      Full                                                        +---------+---------------+---------+-----------+----------+--------------+ POP      Full           Yes      Yes                                 +---------+---------------+---------+-----------+----------+--------------+ PTV      Full                                                        +---------+---------------+---------+-----------+----------+--------------+ PERO     Full                                                         +---------+---------------+---------+-----------+----------+--------------+   +---------+---------------+---------+-----------+----------+--------------+  LEFT     CompressibilityPhasicitySpontaneityPropertiesThrombus Aging +---------+---------------+---------+-----------+----------+--------------+ CFV      Full           Yes      Yes                                 +---------+---------------+---------+-----------+----------+--------------+ SFJ      Full                                                        +---------+---------------+---------+-----------+----------+--------------+ FV Prox  Full                                                        +---------+---------------+---------+-----------+----------+--------------+ FV Mid   Full                                                        +---------+---------------+---------+-----------+----------+--------------+ FV DistalFull                                                        +---------+---------------+---------+-----------+----------+--------------+ PFV      Full                                                        +---------+---------------+---------+-----------+----------+--------------+ POP      Full           Yes      Yes                                 +---------+---------------+---------+-----------+----------+--------------+ PTV      Full                                                        +---------+---------------+---------+-----------+----------+--------------+ PERO     Full                                                        +---------+---------------+---------+-----------+----------+--------------+     Summary: BILATERAL: - No evidence of deep vein thrombosis seen in the lower extremities, bilaterally. - No evidence of superficial venous thrombosis in the lower extremities, bilaterally. -   *See table(s) above for measurements and observations. Electronically signed by  Sherald Hess MD  on 08/31/2021 at 12:34:17 PM.    Final    CT ANGIO HEAD NECK W WO CM (CODE STROKE)  Result Date: 08/29/2021 CLINICAL DATA:  Neuro deficit, acute, stroke suspected EXAM: CT ANGIOGRAPHY HEAD AND NECK CT PERFUSION BRAIN TECHNIQUE: Multidetector CT imaging of the head and neck was performed using the standard protocol during bolus administration of intravenous contrast. Multiplanar CT image reconstructions and MIPs were obtained to evaluate the vascular anatomy. Carotid stenosis measurements (when applicable) are obtained utilizing NASCET criteria, using the distal internal carotid diameter as the denominator. Multiphase CT imaging of the brain was performed following IV bolus contrast injection. Subsequent parametric perfusion maps were calculated using RAPID software. CONTRAST:  100 mL Omnipaque 350 COMPARISON:  None. FINDINGS: CTA NECK Aortic arch: Great vessel origins are patent. Right carotid system: Patent. Noncalcified plaque at the ICA origin causes 65% stenosis. Left carotid system: Patent. Mixed plaque at the ICA origin causes less than 50% stenosis. Vertebral arteries: Left vertebral artery is patent. Absent opacification of the proximal right vertebral artery at the origin with minimal reconstitution of the V1 segment followed by reocclusion. The V2 segment is occluded proximally with reconstitution at the C4 level. Skeleton: Mild cervical spine degenerative changes, greatest at C6-C7. Other neck: Unremarkable. Upper chest: No apical lung mass. Review of the MIP images confirms the above findings CTA HEAD Anterior circulation: Intracranial internal carotid arteries are patent with mild calcified plaque. Anterior and middle cerebral arteries are patent. Posterior circulation: There is decreased caliber of the intracranial left vertebral artery throughout its course. Appears to terminate at patent left PICA. Intracranial right vertebral artery is patent. Basilar is patent but there is  low density within the lumen proximally. Superior cerebellar artery origins are patent. Bilateral posterior communicating arteries are present. Posterior cerebral arteries are patent. Venous sinuses: As permitted by contrast timing, patent. Review of the MIP images confirms the above findings CT Brain Perfusion Findings: CBF (<30%) Volume: 0mL Perfusion (Tmax>6.0s) volume: 0mL Mismatch Volume: 0mL Infarction Location: None. IMPRESSION: Abnormal extracranial right vertebral artery with limited opacification proximally and reconstitution at the C4 level. May reflect high-grade origin stenosis. Right PICA or like AICA origins are not identified. Basilar artery is patent but there is question of nonocclusive clot proximally. Noncalcified plaque at the right ICA origin causes 65% stenosis. Mixed plaque at the left ICA origin causes less than 50% stenosis. Diffuse mild narrowing of the intracranial left vertebral artery, which terminates as a PICA. Perfusion imaging demonstrates no evidence of core infarction or penumbra, but there is limited evaluation of the posterior fossa. Initial results were provided by telephone at the time of interpretation on 08/29/2021 at 10:20 am to provider [facility] , who verbally acknowledged these results. Electronically Signed   By: Guadlupe Spanish M.D.   On: 08/29/2021 10:44     PHYSICAL EXAM  Temp:  [97.9 F (36.6 C)-99.9 F (37.7 C)] 99.1 F (37.3 C) (11/05 0822) Pulse Rate:  [69-94] 77 (11/05 0915) Resp:  [10-22] 15 (11/05 0915) BP: (122-180)/(68-111) 137/86 (11/05 0915) SpO2:  [91 %-99 %] 94 % (11/05 0915) Arterial Line BP: (95-177)/(63-91) 177/73 (11/04 1045)  General - Well nourished, well developed, sleepy but easily awakened. not in acute distress.  Cardiovascular - Regular rhythm and rate.   NEURO:  Mental Status: AA&Ox3. Can hold a conversation. Speech/Language: speech is without dysarthria or aphasia.  Naming, repetition, fluency, and comprehension  intact.  Cranial Nerves:  II: PERRL. Diplopia persists. III, IV, VI: EOMI. Eyelids elevate  symmetrically.  V: Patient c/o numbness to face but can perceive light touch  VII: Right facial droop present VIII: hearing intact to voice on left, right sided hearing loss persists to finger rub. IX, X: Phonation is normal.  XII: tongue is deviated to the left without fasciculations. Motor: 5/5 strength to all muscle groups tested.  Tone: is normal and bulk is normal Sensation- Intact to light touch on right, left sided numbness persists.  Coordination: FTN intact on left with right sided ataxia mild to moderate, HKS: no ataxia in LE.No drift.  Gait- deferred     ASSESSMENT/PLAN Maurice Little is a 65 y.o. male with no known medical history admitted for left-sided numbness, dizziness, headache diplopia.  Developed right facial droop, slurred speech and right hearing loss overnight.  No tPA given due to outside window.  Repeat MRI today to assess post-procedure. Urinary retention noted today, will start flomax.  Patient will be discharged to CIR and will return in approximately 6 weeks for stenting of the right carotid artery.  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 Cardioembolic can not be completely ruled out but less likely  Resultant right peripheral facial droop (CN VII), right lateral gaze incomplete (CN VI), right hearing loss (CN VIII and AICA territory), left sided numbness (right spinal lemniscus and right trigeminothalamic tract), right cerebellum (right PICA and AICA) CT showed right cerebellar infarct CT head and neck right VA origin occlusion, reconstituted at distal V2.  Right PICA occlusion, mid basilar artery high-grade stenosis versus thrombosis, right ICA 65% stenosis, left VA ends at PICA MRI right cerebellar infarct, and right lower  pontine infarct. IR right VA origin, mid to basilar artery prominent stenosis, status post stenting in both arteries.  Severe 90% stenosis right ICA proximal, 50% stenosis left ICA proximal. MRI repeat to further confirm the extent of stroke later today. 2D Echo EF 60 to 65% TEE performed 11/4, LVEF 60-65%No LA/LAAthrombus or mass.+PFO with R-->L shuntingAtherosclerosis of the descending aorta. Recommend 30 day cardiac event monitoring as outpt to rule out afib LDL 220 HgbA1c 5.5 SCDs for DVT prophylaxis No antithrombotic prior to admission, now on aspirin 81 mg daily, Brilinta (ticagrelor) 90 mg bid, and now off heparin IV.  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 Corliss Skains plan for right ICA stenting in the near future likely in 6 weeks.  Hypertension Unstable BP goal relax to < 160 today On Cleviprex, taper off as able. On amlodipine, will consider additional PO antihypertensives. Long term BP goal normotensive  Hyperlipidemia Home meds: None LDL 220, goal < 70 Now on Lipitor 80 Continue statin at discharge  Other Stroke Risk Factors THC abuse, cessation education provided  Other Active Problems Hypokalemia, K 3.1 -> supplemented today. Leukocytosis WBC 12.2-> 13.6   ATTENDING ATTESTATION:  Pt s/p BA and Right VA stenting.doing well. Will do MRI later today to eval for possible new CVA post procedure. . Discussed with nurse, ok to go down to MRI without nurse if cleviprex is off. Cardiology will follow [facility] outpt.   Dr. Viviann Spare evaluated pt independently, reviewed imaging, chart, labs. Discussed and formulated plan  with the APP. Please see APP note above for details.      This patient is critically ill due to respiratory distress, stroke s/p stenting procedure and at significant risk of neurological worsening, death form heart failure, respiratory failure, recurrent stroke, bleeding from [facility], seizure, sepsis. This patient's care requires constant monitoring of vital signs, hemodynamics, respiratory and cardiac monitoring, review of multiple databases, neurological assessment, discussion with family, other specialists and medical decision making of high complexity. I spent 35 minutes of neurocritical care time in the care of this patient.   Loden Laurent,MD   To contact Stroke Continuity provider, please refer to [email or web address]. After hours, contact General Neurology

## 2021-09-01 NOTE — Progress Notes (Signed)
Modified Barium Swallow Progress Note  Patient Details  Name: Maurice Little MRN: [id number] Date of Birth: 1956/03/05  Today's Date: 09/01/2021  Modified Barium Swallow completed.  Full report located under Chart Review in the Imaging Section.  Brief recommendations include the following:  Clinical Impression  MBS was completed using thin liquids via spoon, cup and straw, pureed mateirial and dry solids.  He presented wtih a mild oral dysphagia.  Airway protection was good.  No penetration or aspiration was seen.  Suggest a dysphagia 3 diet with thin liquids.  ST will follow for therapueutic diet tolerance and swallowing therapy.  He will likely be able to quickly advance to regular textured solids.   See below for information regarding swallowing physiology.    ORAL PHASE   -Good lingual control during bolus hold.  He was noted to be able to easily maintain material in a cohesive bolus until instructed to swallow.  However, as A/P transport was began intermittent premature spill was seen to the level of the vallecula.   -Decreased lingual motion led to a delay in oral transit given pureed material and dry solids as well as oral residue.    PHARYNGEAL PHASE   -Functional pharyngeal phase of the swallow.  Swallow trigger was timely and generally no residue was seen.  Trace to mild residue was noted on a few boluses of thin liquids but was not seen on solids or pureed material and seemed related to a mild decrease in pharyngeal stripping.  -No penetration or aspiration was seen.    ESOPHAGEAL PHASE   -Sweep revealed no overt issues.   Swallow Evaluation Recommendations     SLP Diet Recommendations: Thin liquid;Dysphagia 3 (Mech soft) solids   Liquid Administration via: Cup;Straw   Medication Administration: Whole meds with liquid   Supervision: Staff to assist with self feeding       Postural Changes: Seated upright at 90 degrees   Oral Care Recommendations: Oral care BID        Dimas Aguas, MA, CCC-SLP Acute Rehab SLP [phone]  Fleet Contras 09/01/2021,12:53 PM

## 2021-09-01 NOTE — Evaluation (Signed)
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 Joie Reamer presented as code stroke via EMS after he experienced left sided tingling prior to arrival.  His troubles began between 10/31-11/1, when he noted sudden onset dizziness and headache. He laid on the ground and these symptoms eventually subsided over the course of an hour, although they returned AM of admission day.  2 hours prior to arrival, he had been having a bowel movement when he suddenly felt left sided tingling "like neuropathy" of left face, arm, leg. On admission he reported it had been constant since onset without change in character. No weakness.   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 Small additional involvement of right brachium pontis and right pontomedullary junction. No hemorrhage or significant mass effect.  Mild chronic microvascular ischemic changes.  Patient reported he was wokring fulltime prior to this event.   Assessment / Plan / Recommendation Clinical Impression  Motor speech evaluation was completed.  Cranial nerve exam was completed and remarkable for right sided  facial weakness that encompassed his upper and lower face.  Issues with tight eye closure on the right were seen. Lingual range of motion and strength appeared to be adequate.  Deviation was seen with jaw opening with good strength.   Decreased labial strength was seen.  Facial sensation was intact and he did not endorse a difference in sensation between the right and left side of his face. However, he reported bilateral tingling/numbness.   Patient noted to have dysarthric speech.  The [place] Dysarthria Assessment Tool was administered.  He presented with a mild-moderate dysarthria.  Deficits were noted for phonation, resonance and articulation.  Max phonation time was 23 seconds which is in functional range.  Pitch breaks and loudness variation were noted during max phonation.  He was unable to complete the tasks to establish an S/Z ratio.  He was able to sustain low vocal volume but loudness was reduced during loudess task.  Pitch glide appeared adequate with pitch breaks noted.  Hypernasality was noted mostly during the nasal flutter test.  Articulation was reduced and he was noted to have reduced rate of movement (ie "buttercup" 5 productions in 5 seconds places him in the fair category - normal is 12-15 repetitions).  Sequential motion rates were adequate but precision of prodution was reduced.  All these deficits lead to a mild-moderate reduction of intelligibility.  Discussed use of the following strategies to increase intelligiblity of his speech:  slow rate, good breath support and louder speech.  ST will follow during acute stay to initate therapy.  He would benefit from intense post acute rehab to address deficits.    SLP  Assessment  SLP Visit Diagnosis: Dysarthria and anarthria (R47.1)    Recommendations for follow up therapy are one component of a multi-disciplinary discharge planning process, led by the attending physician.  Recommendations may be updated based on patient status, additional  functional criteria and insurance authorization.    Follow Up Recommendations   (Intense post acute rehab)    Frequency and Duration min 1 x/week  2 weeks      SLP Evaluation Cognition  Orientation Level: (P) Oriented X4             Oral / Motor  Oral Motor/Sensory Function Overall Oral Motor/Sensory Function: Moderate impairment Facial ROM: Reduced right Facial Symmetry: Abnormal symmetry right;Suspected CN VII (facial) dysfunction Facial Strength: Reduced right;Suspected CN VII (facial) dysfunction Facial Sensation: Reduced right;Reduced left;Suspected CN V (Trigeminal) dysfunction Lingual ROM: Within Functional Limits Lingual Symmetry: Within Functional Limits Lingual Strength: Within Functional Limits Mandible: Impaired Motor Speech Overall Motor Speech: Impaired Respiration: Within functional limits Phonation: Low vocal intensity Resonance: Hypernasality Articulation: Impaired Level of Impairment: Phrase Intelligibility: Intelligibility reduced Word: 75-100% accurate Phrase: 50-74% accurate Sentence: 50-74% accurate Conversation: 50-74% accurate Motor Planning: Witnin functional limits Motor Speech Errors: Not applicable Effective Techniques: Slow rate;Increased vocal intensity;Over-articulate   GO                    Dimas Aguas, MA, CCC-SLP Acute Rehab SLP [phone]  Fleet Contras 09/01/2021, 11:37 AM

## 2021-09-02 DIAGNOSIS — I651 Occlusion and stenosis of basilar artery: Secondary | ICD-10-CM | POA: Diagnosis not present

## 2021-09-02 LAB — BASIC METABOLIC PANEL
Anion gap: 7 (ref 5–15)
BUN: 14 mg/dL (ref 8–23)
CO2: 25 mmol/L (ref 22–32)
Calcium: 8.9 mg/dL (ref 8.9–10.3)
Chloride: 105 mmol/L (ref 98–111)
Creatinine, Ser: 0.77 mg/dL (ref 0.61–1.24)
GFR, Estimated: >60 mL/min (ref >60)
Glucose, Bld: 113 mg/dL — ABNORMAL HIGH (ref 70–99)
Potassium: 3.9 mmol/L (ref 3.5–5.1)
Sodium: 137 mmol/L (ref 135–145)

## 2021-09-02 LAB — CBC
HCT: 39.3 % (ref 39.0–52.0)
Hemoglobin: 13.2 g/dL (ref 13.0–17.0)
MCH: 32.1 pg (ref 26.0–34.0)
MCHC: 33.6 g/dL (ref 30.0–36.0)
MCV: 95.6 fL (ref 80.0–100.0)
Platelets: 264 10*3/uL (ref 150–400)
RBC: 4.11 MIL/uL — ABNORMAL LOW (ref 4.22–5.81)
RDW: 14.3 % (ref 11.5–15.5)
WBC: 10.8 10*3/uL — ABNORMAL HIGH (ref 4.0–10.5)
nRBC: 0 % (ref 0.0–0.2)

## 2021-09-02 LAB — GLUCOSE, CAPILLARY
Glucose-Capillary: 107 mg/dL — ABNORMAL HIGH (ref 70–99)
Glucose-Capillary: 100 mg/dL — ABNORMAL HIGH (ref 70–99)
Glucose-Capillary: 127 mg/dL — ABNORMAL HIGH (ref 70–99)
Glucose-Capillary: 94 mg/dL (ref 70–99)
Glucose-Capillary: 116 mg/dL — ABNORMAL HIGH (ref 70–99)

## 2021-09-02 MED ORDER — HYDRALAZINE HCL 25 MG PO TABS
25.0000 mg | ORAL_TABLET | Freq: Four times a day (QID) | ORAL | Status: DC
  Administered 2021-09-02 – 2021-09-04 (×9): 25 mg via ORAL
  Filled 2021-09-02 (×9): qty 1

## 2021-09-02 MED ORDER — HYDRALAZINE HCL 20 MG/ML IJ SOLN
10.0000 mg | INTRAMUSCULAR | Status: DC | PRN
  Administered 2021-09-02: 10 mg via INTRAVENOUS
  Filled 2021-09-02: qty 1

## 2021-09-02 NOTE — PMR Pre-admission (Signed)
PMR Admission Coordinator Pre-Admission Assessment  Patient: Maurice Little is an 65 y.o., male MRN: [id number] DOB: 1956/07/03 Height: 6' (182.9 cm) Weight: 92 kg  Insurance Information HMO:     PPO:      PCP:      IPA:      80/20:     OTHER:  PRIMARY: [employer] other      Policy#: [id number]      Subscriber: patient CM Name: Christianne Borrow      Phone#: [phone] ext. [phone]     Fax#: [phone] Pre-Cert#: [id number]  Received approval for 6 days on 09/03/21. Updates due on day 6.    Employer:  Benefits:  Phone #: [email or web address]     Name:  Eff. Date: 10/28/20-10/27/21     Deduct: $500 ($0 met)      Out of Pocket Max: $3,000 ($30 met)      Life Max: NA CIR: 90% coverage, 10% co-insurance      SNF: 90% coverage, 10% co-insurance Outpatient: $30 co-pay     Co-Pay:  Home Health: 90% coverage, 10% co-insurance      Co-Pay:  DME: 90% coverage, 10% co-insurance     Co-Pay:  Providers: in-network SECONDARY:       Policy#:      Phone#:   Development worker, community:       Phone#:   The Engineer, petroleum" for patients in Inpatient Rehabilitation Facilities with attached "Privacy Act [place] Records" was provided and verbally reviewed with: Patient  Emergency Contact Information Contact Information     Name Relation Home Work Mobile   Gondek,jodie Spouse   [phone]   Maxie,Uda Mother   [phone]       Current Medical History  Patient Admitting Diagnosis: right cerebral infarcts History of Present Illness: Pt is a 65 year old male with medical hx significant for: HTN, HLD, PVD. Pt presented to hospital as code stroke on 08/29/21. Pt was c/o dizziness, headache, nausea and vomiting, left-sided numbness, diplopia. Later developed right facial droop, right hearing loss and right-sided weakness. CTH revealed subacute right cerebellar infarcts. 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 MRI confirmed right  cerebellar infarct and right lower pontine infarct. 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 TEE on 11/4 showed EF of 60-65%, no LA/LAA thrombus or mass,small PFO, atherosclerosis of the descending aorta. Repeat MRI on 11/5 showed increased size of right cerebellar infarct and new infarcts in left parietal and occipital lobes. Therapy evaluations completed and CIR recommended d/t pt's deficits in functional mobility and inability to perform ADLs independently.  Complete NIHSS TOTAL: 8  Patient's medical record from [facility] has been reviewed by the rehabilitation admission coordinator and physician.  Past Medical History  Past Medical History:  Diagnosis Date   Hypertension    PAD (peripheral artery disease) ([place])     Has the patient had major surgery during 100 days prior to admission? Yes  Family History   family history is not on file.  Current Medications  Current Facility-Administered Medications:    acetaminophen (TYLENOL) tablet 650 mg, 650 mg, Oral, Q4H PRN, 650 mg at 09/03/21 0947 **OR** acetaminophen (TYLENOL) 160 MG/5ML solution 650 mg, 650 mg, Per Tube, Q4H PRN **OR** acetaminophen (TYLENOL) suppository 650 mg, 650 mg, Rectal, Q4H PRN, Skeet Latch, MD   amLODipine (NORVASC) tablet 10 mg, 10 mg, Oral, Daily, Rosalin Hawking, MD, 10  mg at 09/03/21 0931   aspirin chewable tablet 81 mg, 81 mg, Oral, Daily, 81 mg at 09/03/21 0930 **OR** aspirin chewable tablet 81 mg, 81 mg, Per Tube, Daily, Skeet Latch, MD   atorvastatin (LIPITOR) tablet 80 mg, 80 mg, Oral, QHS, Skeet Latch, MD, 80 mg at 09/02/21 2110   Chlorhexidine Gluconate Cloth 2 % PADS 6 each, 6 each, Topical, Daily, Skeet Latch, MD, 6 each at 09/02/21 1303   clevidipine (CLEVIPREX) infusion 0.5 mg/mL, 0-21 mg/hr, Intravenous, Continuous, Rosalin Hawking, MD, Stopped at 09/01/21 1120    hydrALAZINE (APRESOLINE) injection 10 mg, 10 mg, Intravenous, Q1H PRN, Beulah Gandy A, NP, 10 mg at 09/02/21 0853   hydrALAZINE (APRESOLINE) tablet 25 mg, 25 mg, Oral, Q6H, Beulah Gandy A, NP, 25 mg at 09/03/21 1246   influenza vac split quadrivalent PF (FLUARIX) injection 0.5 mL, 0.5 mL, Intramuscular, Tomorrow-1000, Amie Portland, MD   insulin aspart (novoLOG) injection 2-6 Units, 2-6 Units, Subcutaneous, Q4H, Skeet Latch, MD, 2 Units at 09/02/21 1314   labetalol (NORMODYNE) injection 10 mg, 10 mg, Intravenous, Q2H PRN, Skeet Latch, MD, 10 mg at 09/02/21 1324   losartan (COZAAR) tablet 50 mg, 50 mg, Oral, BID, Rosalin Hawking, MD, 50 mg at 09/03/21 0931   ondansetron (ZOFRAN) injection 4 mg, 4 mg, Intravenous, Q6H PRN, Skeet Latch, MD, 4 mg at 08/31/21 2132   potassium chloride SA (KLOR-CON) CR tablet 40 mEq, 40 mEq, Oral, Once, Beulah Gandy A, NP   senna-docusate (Senokot-S) tablet 1 tablet, 1 tablet, Oral, QHS PRN, Skeet Latch, MD, 1 tablet at 09/02/21 2110   tamsulosin (FLOMAX) capsule 0.4 mg, 0.4 mg, Oral, Daily, de [place], [place] E, NP, 0.4 mg at 09/03/21 0932   ticagrelor (BRILINTA) tablet 90 mg, 90 mg, Oral, BID, 90 mg at 09/03/21 0931 **OR** ticagrelor (BRILINTA) tablet 90 mg, 90 mg, Per Tube, BID, Skeet Latch, MD  Patients Current Diet:  Diet Order             DIET DYS 3 Room service appropriate? Yes; Fluid consistency: Thin  Diet effective now                   Precautions / Restrictions Precautions Precautions: Fall Precaution Comments: diplopia, Ataxic R; L side numbness Restrictions Weight Bearing Restrictions: No   Has the patient had 2 or more falls or a fall with injury in the past year? No  Prior Activity Level Limited Community (1-2x/wk): drives, works from home  Prior Functional Level Self Care: Did the patient need help bathing, dressing, using the toilet or eating? Independent  Indoor Mobility: Did the patient need  assistance with walking from room to room (with or without device)? Independent  Stairs: Did the patient need assistance with internal or external stairs (with or without device)? Independent  Functional Cognition: Did the patient need help planning regular tasks such as shopping or remembering to take medications? Independent  Patient Information Are you of Hispanic, Latino/a,or Spanish origin?: X. Patient unable to respond, A. No, not of Hispanic, Latino/a, or Spanish origin What is your race?: X. Patient unable to respond, A. White Do you need or want an interpreter to communicate with a doctor or health care staff?: 9. Unable to respond  Patient's Response To:  Health Literacy and Transportation Is the patient able to respond to health literacy and transportation needs?: No Health Literacy - How often do you need to have someone help you when you read instructions, pamphlets, or other written material from your  doctor or pharmacy?: Patient unable to respond  Home Assistive Devices / [place] Devices/Equipment: Eyeglasses Home Equipment: Shower seat - built in, Hand held shower head  Prior Device Use: Indicate devices/aids used by the patient prior to current illness, exacerbation or injury? None of the above  Current Functional Level Cognition  Overall Cognitive Status: Impaired/Different from baseline Current Attention Level: Selective Orientation Level: Oriented X4 Following Commands: Follows one step commands with increased time, Follows one step commands consistently Safety/Judgement: Decreased awareness of safety General Comments: follows one step commands well and consistently, very motivated but aware of deficits and need for rest breaks    Extremity Assessment (includes Sensation/Coordination)  Upper Extremity Assessment: RUE deficits/detail, LUE deficits/detail RUE Deficits / Details: ROM adn strength WFL; apparent sensory motor impariment leading to  ataxic movements RUE Coordination: decreased fine motor, decreased gross motor LUE Deficits / Details: ROM most likely limited by inability to feel; Strength  - weaker proximally but funcitonal; overall 4/5 throughout LUE Sensation: decreased light touch, decreased proprioception LUE Coordination: decreased fine motor, decreased gross motor  Lower Extremity Assessment: Defer to PT evaluation RLE Deficits / Details: strength WFL, ataxia noted RLE Sensation: WNL RLE Coordination: decreased fine motor, decreased gross motor LLE Deficits / Details: grossly 4/5 LLE Sensation: decreased light touch, decreased proprioception LLE Coordination: decreased fine motor, decreased gross motor    ADLs  Overall ADL's : Independent Eating/Feeding: NPO Grooming: Maximal assistance Grooming Details (indicate cue type and reason): brushing teeth with toothette and suction; difficulty controlling movement patterns Upper Body Bathing: Maximal assistance Lower Body Bathing: Maximal assistance, Sit to/from stand Upper Body Dressing : Maximal assistance, Total assistance Lower Body Dressing: Maximal assistance Lower Body Dressing Details (indicate cue type and reason): don socks Toilet Transfer: +2 for physical assistance, Rolling walker (2 wheels), Moderate assistance Toilet Transfer Details (indicate cue type and reason): simulated EOB to chair. pt sit<>Stand from chair x3 during session. cues for anterior weight shift Toileting- Clothing Manipulation and Hygiene: Total assistance Functional mobility during ADLs: Maximal assistance, +2 for physical assistance General ADL Comments: pt progressed to static standing this session with RW. shoes helping with proceoption of feet to anterior weight shift. pt progressed from two person hand held (A) to static standing mod (A) one person RW this session.    Mobility  Overal bed mobility: Needs Assistance Bed Mobility: Supine to Sit Supine to sit: Mod assist General  bed mobility comments: assist for progression LEs to EOB, trunk elevation.    Transfers  Overall transfer level: Needs assistance Equipment used: 2 person hand held assist, Rolling walker (2 wheels) Transfers: Sit to/from Stand Sit to Stand: Mod assist, +2 physical assistance, Min assist, From elevated surface Stand pivot transfers: Total assist Transfer via Lift Equipment: Stedy General transfer comment: Initially mod +2 for power up, rise, steadying, max cues for hand placement when rising, "nose over toes" when standing up to prevent excessive truncal extension. Transitioning to min +2 with continued, repeated practice. STS x4, x1 from EOB and x3 from recliner. RW placed in front of pt for last 2 stands to promote slight hip flexion.    Ambulation / Gait / Stairs / Wheelchair Mobility  Ambulation/Gait Ambulation/Gait assistance: Mod assist, +2 physical assistance Gait Distance (Feet): 5 Feet Assistive device: 2 person hand held assist Gait Pattern/deviations: Step-through pattern, Decreased stride length, Ataxic, Drifts right/left, Wide base of support, Leaning posteriorly General Gait Details: mod +2 for steadying, facilitating slight hip flexion at ASIS on R to  prevent truncal hyperextension. Gait velocity: decr Pre-gait activities: worked on erect posture and wt shifting to maintain midline    Posture / Balance Dynamic Sitting Balance Sitting balance - Comments: pt able to maintain static sitting with close supervision, loses balance to R when dynamic Balance Overall balance assessment: Needs assistance Sitting-balance support: Bilateral upper extremity supported, Feet supported Sitting balance-Leahy Scale: Fair Sitting balance - Comments: pt able to maintain static sitting with close supervision, loses balance to R when dynamic Postural control: Right lateral lean Standing balance support: Bilateral upper extremity supported, During functional activity, Reliant on assistive device  for balance Standing balance-Leahy Scale: Poor Standing balance comment: reliant on external assist to maintain standing balance    Special needs/care consideration Skin Ecchymosis: wrist/left and Diabetic management novoLOG 2-6 units every 4 hours, Urethral catheter   Previous Home Environment (from acute therapy documentation) Living Arrangements: Spouse/significant other  Lives With: Spouse Available Help at Discharge: Family, Available 24 hours/day Type of Home: House Home Layout: One level Home Access: Stairs to enter Entrance Stairs-Rails: Right, Left Entrance Stairs-Number of Steps: 3 Bathroom Shower/Tub: Multimedia programmer: Standard Bathroom Accessibility: Yes How Accessible: Accessible via walker Home Care Services: No  Discharge Living Setting Plans for Discharge Living Setting: Patient's home Type of Home at Discharge: House Discharge Home Layout: One level Discharge Home Access: Stairs to enter Entrance Stairs-Rails: Right, Left Entrance Stairs-Number of Steps: 3 Discharge Bathroom Shower/Tub: Walk-in shower Discharge Bathroom Toilet: Standard Discharge Bathroom Accessibility: Yes How Accessible: Accessible via walker Does the patient have any problems obtaining your medications?: No  Social/Family/Support Systems Anticipated Caregiver: Bladimir Auman, wife Anticipated Caregiver's Contact Information: [phone] Caregiver Availability: 24/7 Discharge Plan Discussed with Primary Caregiver: Yes Is Caregiver In Agreement with Plan?: Yes Does Caregiver/Family have Issues with Lodging/Transportation while Pt is in Rehab?: No  Goals Patient/Family Goal for Rehab: Min A: PT/OT, Mod I: ST Expected length of stay: 16-18 days days Pt/Family Agrees to Admission and willing to participate: Yes Program Orientation Provided & Reviewed with Pt/Caregiver Including Roles  & Responsibilities: Yes  Decrease burden of Care through IP rehab admission: NA  Possible  need for SNF placement upon discharge: Not anticipated  Patient Condition: I have reviewed medical records from [facility], spoken with CM, and patient and spouse. I met with patient at the bedside and discussed via phone for inpatient rehabilitation assessment.  Patient will benefit from ongoing PT, OT, and SLP, can actively participate in 3 hours of therapy a day 5 days of the week, and can make measurable gains during the admission.  Patient will also benefit from the coordinated team approach during an Inpatient Acute Rehabilitation admission.  The patient will receive intensive therapy as well as Rehabilitation physician, nursing, social worker, and care management interventions.  Due to bladder management, safety, skin/wound care, disease management, medication administration, pain management, and patient education the patient requires 24 hour a day rehabilitation nursing.  The patient is currently Mod A+2 with mobility and Max A with basic ADLs.  Discharge setting and therapy post discharge at home with home health is anticipated.  Patient has agreed to participate in the Acute Inpatient Rehabilitation Program and will admit today.  Preadmission Screen Completed By:  Bethel Born, 09/03/2021 4:34 PM ______________________________________________________________________   Discussed status with Dr. Dagoberto Ligas  on 09/04/21 at 81 and received approval for admission today.  Admission Coordinator:  Bethel Born, CCC-SLP, time 1130/Date 09/04/21   Assessment/Plan: Diagnosis: Does the need for  close, 24 hr/day Medical supervision in concert with the patient's rehab needs make it unreasonable for this patient to be served in a less intensive setting? Yes Co-Morbidities requiring supervision/potential complications: R hemiparesis; basilar thrombus; diplopia; N?V, dizziness, HA; HTN, HLD; PVD Due to bladder management, bowel management, safety, skin/wound care, disease management,  medication administration, pain management, and patient education, does the patient require 24 hr/day rehab nursing? Yes Does the patient require coordinated care of a physician, rehab nurse, PT, OT, and SLP to address physical and functional deficits in the context of the above medical diagnosis(es)? Yes Addressing deficits in the following areas: balance, endurance, locomotion, strength, transferring, bowel/bladder control, bathing, dressing, feeding, grooming, toileting, cognition, speech, and swallowing Can the patient actively participate in an intensive therapy program of at least 3 hrs of therapy 5 days a week? Yes The potential for patient to make measurable gains while on inpatient rehab is good Anticipated functional outcomes upon discharge from inpatient rehab: modified independent and supervision PT, modified independent and supervision OT, n/a SLP Estimated rehab length of stay to reach the above functional goals is: 16-=18 days Anticipated discharge destination: Home 10. Overall Rehab/Functional Prognosis: good   MD Signature:

## 2021-09-02 NOTE — Progress Notes (Signed)
Per Dr. Amada Jupiter, to keep SBP below 170

## 2021-09-02 NOTE — Progress Notes (Signed)
STROKE TEAM PROGRESS NOTE   SUBJECTIVE (INTERVAL HISTORY) His wife is at the bedside.  Pt lying in bed, neuro stable and in no acute distress.  He is more alert and awake today and feels that his symptoms are unchanged since yesterday.  Still has left sided numbness, diplopia, nystagums and right hearing loss but no new symptoms. His personality/behavior is returning back to normal.  Patient remains stable with no acute events overnight.  MRI completed yesterday. voiding has been an issue per nursing.   OBJECTIVE Temp:  [98.1 F (36.7 C)-98.9 F (37.2 C)] 98.2 F (36.8 C) (11/06 0800) Pulse Rate:  [58-91] 69 (11/06 0900) Cardiac Rhythm: Normal sinus rhythm (11/05 2000) Resp:  [10-20] 12 (11/06 0900) BP: (146-183)/(88-112) 181/112 (11/06 0900) SpO2:  [92 %-98 %] 97 % (11/06 0900)  Recent Labs  Lab 09/01/21 1608 09/01/21 2013 09/01/21 2318 09/02/21 0324 09/02/21 0748  GLUCAP 103* 112* 108* 107* 100*    Recent Labs  Lab 08/29/21 0932 08/29/21 0934 08/30/21 0412 08/31/21 0409 09/01/21 0311 09/02/21 0504  NA 136 139 134* 134* 134* 137  K 3.6 3.5 3.3* 3.3* 3.1* 3.9  CL 108 107 103 104 103 105  CO2 17*  --  22 21* 24 25  GLUCOSE 183* 188* 128* 133* 110* 113*  BUN 10 10 7* _0 CREATININE 1.03 0.90 0.74 0.75 0.71 0.77  CALCIUM 9.0  --  8.9 8.0* 8.7* 8.9    Recent Labs  Lab 08/29/21 0932  AST 22  ALT 15  ALKPHOS 59  BILITOT 0.8  PROT 6.4*  ALBUMIN 3.7    Recent Labs  Lab 08/29/21 0932 08/29/21 0934 08/29/21 2340 08/31/21 0409 09/01/21 0311 09/02/21 0504  WBC 11.0*  --  12.2* 13.7* 13.6* 10.8*  NEUTROABS 5.0  --   --   --   --   --   HGB 13.7 14.3 14.6 12.8* 13.2 13.2  HCT 41.0 42.0 43.7 38.2* 39.0 39.3  MCV 94.9  --  95.6 95.5 94.2 95.6  PLT 378  --  284 308 273 264    No results for input(s): CKTOTAL, CKMB, CKMBINDEX, TROPONINI in the last 168 hours. No results for input(s): LABPROT, INR in the last 72 hours.  No results for input(s):  COLORURINE, LABSPEC, [place], GLUCOSEU, HGBUR, BILIRUBINUR, KETONESUR, PROTEINUR, UROBILINOGEN, NITRITE, LEUKOCYTESUR in the last 72 hours.  Invalid input(s): APPERANCEUR     Component Value Date/Time   CHOL 288 (H) 08/29/2021 2340   TRIG 82 08/29/2021 2340   HDL 52 08/29/2021 2340   CHOLHDL 5.5 08/29/2021 2340   VLDL 16 08/29/2021 2340   LDLCALC 220 (H) 08/29/2021 2340   Lab Results  Component Value Date   HGBA1C 5.5 08/29/2021      Component Value Date/Time   LABOPIA NONE DETECTED 08/30/2021 0751   COCAINSCRNUR NONE DETECTED 08/30/2021 0751   LABBENZ NONE DETECTED 08/30/2021 0751   AMPHETMU NONE DETECTED 08/30/2021 0751   THCU POSITIVE (A) 08/30/2021 0751   LABBARB NONE DETECTED 08/30/2021 0751    No results for input(s): ETH in the last 168 hours.   MR BRAIN WO CONTRAST  Result Date: 09/01/2021 CLINICAL DATA:  Stroke, follow-up. 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 EXAM: MRI HEAD WITHOUT CONTRAST TECHNIQUE: Multiplanar, multiecho pulse sequences of the brain and surrounding structures were obtained without intravenous contrast. COMPARISON:  Head MRI 08/29/2021 FINDINGS: A limited examination was performed at the request of the  ordering provider consisting of axial and coronal diffusion weighted imaging and axial susceptibility weighted imaging. There is a large acute infarct in the right cerebellar hemisphere in the PICA territory which has increased in size from the prior MRI. Additional new patchy acute right cerebellar infarcts are present in the AICA territory, and there are also new and larger acute infarcts involving the right middle cerebellar peduncle, right medulla, and right greater than left pons. New punctate acute infarcts are noted in the medial aspects of the left parietal and left occipital lobes. There is a small amount of petechial hemorrhage associated with the right PICA infarct.  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 Electronically Signed   By: Logan Bores M.D.   On: 09/01/2021 19:52   MR BRAIN WO CONTRAST  Result Date: 08/29/2021 CLINICAL DATA:  Stroke, follow up EXAM: MRI HEAD WITHOUT CONTRAST TECHNIQUE: Multiplanar, multiecho pulse sequences of the brain and surrounding structures were obtained without intravenous contrast. COMPARISON:  None. FINDINGS: Brain: Reduced diffusion is present in the inferior right cerebellum. Additional small foci of involvement in the right brachium pontis and right pontomedullary junction. No significant mass effect. No evidence of intracranial hemorrhage. Patchy foci of T2 hyperintensity in the supratentorial white matter are nonspecific but may reflect mild chronic microvascular ischemic changes. Ventricles and sulci are within normal limits in size and configuration. Vascular: Major vessel flow voids at the skull base are preserved. Skull and upper cervical spine: Normal marrow signal is preserved. Sinuses/Orbits: Paranasal sinus mucosal thickening. Orbits are unremarkable. Other: Sella is unremarkable.  Mastoid air cells are clear. 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 Small additional involvement of right brachium pontis and right pontomedullary junction. No hemorrhage or significant mass effect. Mild chronic microvascular ischemic changes. Electronically Signed   By: Macy Mis M.D.   On: 08/29/2021 13:03   IR Intra Cran Stent  Result Date: 09/02/2021 CLINICAL DATA:  History of progressive onset of dysarthria, nausea, vomiting and left-sided numbness. Patient also developed diplopia. 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 EXAM: INTRACRANIAL STENT (INCL PTA) COMPARISON:  CT angiogram of the  head and neck of August 29, 2021, and MRI of the brain of August 29, 2021. MEDICATIONS: Heparin 4,000 units IV. Ancef 2 g IV antibiotic was administered within 1 hour of the procedure. ANESTHESIA/SEDATION: Mac anesthesia as per Department of Anesthesiology for diagnostic portion followed by general anesthesia for endovascular treatment portion. CONTRAST:  Omnipaque 300 approximately 150 mL. FLUOROSCOPY TIME:  Fluoroscopy Time: 41 minutes 18 seconds (2310 mGy). COMPLICATIONS: None immediate. TECHNIQUE: Informed written consent was obtained from the patient after a thorough discussion of the procedural risks, benefits and alternatives. All questions were addressed. Maximal Sterile Barrier Technique was utilized including caps, mask, sterile gowns, sterile gloves, sterile drape, hand hygiene and skin antiseptic. A timeout was performed prior to the initiation of the procedure. The right forearm to the wrist was prepped and draped in the usual sterile manner. The right radial artery was then identified with ultrasound and its morphology documented. A dorsal palmar anastomosis was verified to be present. Using ultrasound guidance, and a micropuncture set access into the right radial artery was obtained with a 6/7 French radial sheath. The obturator, and the guidewire were removed. Good aspiration was obtained from the side port of the sheath. Cocktail of 2000  units of heparin, 2.5 mg of verapamil, and 200 mcg of nitroglycerin was then infused through the sheath in diluted form without event. A right radial arteriogram was performed. Over a 0.035 inch Roadrunner guidewire, a 5 [place] Simmons 2 diagnostic catheter was advanced to the aortic arch region, and arteriograms were performed of the right common carotid artery, the left common carotid artery, the left vertebral artery, and the right subclavian artery at the origin of stump of the occluded right vertebral artery. FINDINGS: The diagnostic study was somewhat marred  by persistent motion artifact. The left common carotid arteriogram demonstrates the left external carotid artery and its major branches to be widely patent. The left internal carotid artery at the bulb demonstrates approximately 50% stenosis without evidence of intraluminal filling defects. More distally, the left internal carotid artery is seen to opacify to the cranial skull base. 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 A left posterior communicating artery is seen opacifying the left posterior cerebral artery distribution. The left middle cerebral artery and the left anterior cerebral artery opacify into the capillary and venous phases. Non dominant left vertebral artery demonstrates patency at its origin with brisk ascent to the cranial skull base to supply primarily the ipsilateral left posteroinferior cerebellar artery. The right common carotid arteriogram demonstrates the right external carotid artery and its major branches to be widely patent. The right internal carotid artery at the bulb has approximately 90% stenosis due to a circumferential smooth plaque. More distally, the vessel is seen to opacify to the cranial skull base. The petrous, the cavernous and the supraclinoid segments are widely patent. A right posterior communicating artery is seen opacifying the right posterior cerebral artery distribution. The right middle and the right anterior cerebral arteries opacify into the capillary and venous phases. The right subclavian arteriogram at the origin of the occluded right vertebral artery demonstrates partial reconstitution of the right vertebral artery at the level of C3-C4 from collaterals arising from the cervical branch of the thyrocervical trunk with opacification noted more distally into the dominant right vertebrobasilar junction. Opacification is noted into the basilar artery,  and retrogradely in the right vertebral artery to just above the site of the occlusion in the proximal right vertebral artery. ENDOVASCULAR REVASCULARIZATION OF OCCLUDED DOMINANT PROXIMAL RIGHT VERTEBRAL ARTERY WITH STENT ASSISTED ANGIOPLASTY, AND OF THE MID BASILAR ARTERY WITH STENT ASSISTED ANGIOPLASTY Over the exchange 035 inch 300 cm Rosen exchange guidewire, the combination of a 95 cm Benchmark guide catheter with a support 5.5 [place] Berenstein catheter was advanced and positioned just proximal to the stump of the occluded dominant right vertebral artery. The guidewire and the support catheter were gently retrieved and removed. Good aspiration was obtained from the hub of the Benchmark guide catheter. Bilateral roadmap was then obtained centered over the vertebral artery origin and distally through the [facility] guide catheter. Over an 0.014 inch standard Synchro micro guidewire with a moderate J configuration, inside of an 021 160 cm Trevo Trak microcatheter combination was advanced to just proximal to the occluded dominant right vertebral artery. Using a torque device with gentle manipulation access was obtained with the micro guidewire through the occluded right vertebral artery without difficulty. The guidewire was advanced to the vertebral artery followed by the microcatheter. The guidewire was removed. Good aspiration obtained from the hub of microcatheter. A control arteriogram was then performed through the microcatheter distally with opacification noted in the right  vertebrobasilar junction, the basilar artery, the right posterior cerebral artery, the superior cerebellar arteries and the anterior-inferior cerebellar arteries. Also noted was significant narrowing secondary to an eccentric arteriosclerotic plaque in the mid basilar artery just proximal to the origin of right anterior inferior cerebellar artery. The microcatheter was then exchanged under constant fluoroscopic guidance for an 014 inch 300  cm Synchro standard micro guidewire with a moderate J configuration at its tip. Measurements were then performed of the most proximal portion of the right vertebral artery from the previous diagnostic arteriogram. It was decided to proceed with initial balloon angioplasty of the origin of the occluded right vertebral artery with a 2.5 mm x 15 mm Apex Monorail angioplasty balloon. This was prepped and purged antegradely with heparinized saline infusion, and retrogradely with 75% contrast and 25% heparinized saline infusion. Using rapid exchange technique, this was then advanced and positioned at the origin of the occluded right vertebral artery with the proximal marker proximal to the origin of the right vertebral artery. Control inflation was then performed using micro inflation syringe device via micro tubing to approximately 8 atmospheres where it was maintained for approximately 15 seconds. The balloon was deflated and retrieved and removed. Control arteriogram performed through [facility] guide catheter demonstrated significantly improved caliber and flow through the angioplastied segment with copious flow noted intracranially. 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 Patency was noted of the posterior cerebral arteries, the superior cerebellar arteries and faintly the anterior-inferior cerebellar arteries bilaterally. Significantly improved caliber was also noted at the origin of the right vertebral artery. A 120 cm Phenom Plus catheter was then advanced over the exchange micro guidewire under fluoroscopic guidance and positioned in the distal right vertebral artery. The Benchmark catheter was now advanced without difficulty into the distal right vertebral artery also. A roadmap was then obtained. Using a torque device, the exchange micro guidewire was safely advanced under fluoroscopic guidance into the proximal basilar artery and then  the distal basilar artery into the right posterior cerebral artery P1 segment. Measurements were then performed of the basilar artery distal and proximal to the prominent stenosis. It was decided to proceed with placement of a Resolute Onyx 3 mm x 15 mm balloon mounted stent intracranially. However, the stented stent delivery apparatus could not be advanced through the 045 Phenom Plus catheter. The Phenom Plus catheter was removed with the wire maintained in the right P1 segment. The Benchmark guide catheter was easily then advanced into the right vertebrobasilar junction just proximal to the basilar artery. The Resolute Onyx which had been prepped with heparinized saline infusion retrogradely and angiographically with 60% contrast and 40% heparinized saline infusion was reintroduced and advanced without difficulty over the exchange wire to the distal end of the Benchmark guide catheter. This stent apparatus was easily advanced and positioned with the proximal and the distal markers adequate distant from the site of the significant mid basilar artery stenosis. The stent was then deployed by inflating balloon using a micro inflation syringe device via micro tubing. Balloon was expanded to approximately 2.9 mm. The balloon was then deflated and retrieved. A control arteriogram performed through the Benchmark guide catheter demonstrated excellent apposition and flow through the stented mid basilar artery. Patency was seen of the posterior cerebral arteries, the superior cerebellar arteries and faintly the anterior-inferior cerebellar arteries. The Benchmark catheter was then retrieved more proximally while the exchange wire was retrieved into the V4 segment of the right vertebral  artery. The Benchmark catheter was retrieved to just proximal to the origin of the right vertebral artery. Measurements were again performed of the vertebral artery at this site. It was decided to proceed with placement of a 3.5 mm x 18 mm  Onyx Frontier balloon mountable stent. After having been prepped in the usual manner, this was then advanced and positioned such that the proximal portion of the device was just proximal to the origin of the right vertebral artery. Thereafter, controlled inflation was performed of the balloon mountable stent to approximately 8 atmospheres. This was maintained for approximately 10 seconds. The balloon was then deflated and retrieved and removed. A control arteriogram performed through the Benchmark guide catheter demonstrated excellent apposition and coverage of the previously occluded right vertebral artery origin and proximal portion. Free flow was now noted through the proximal stent into the intracranial portion with patency maintained of the positioned mid basilar artery stent. Control arteriograms were then performed at 15 and 25 minutes post deployment of the devices. These continued to demonstrate excellent apposition with flow extra cranially and intracranially. The patient at this time was also given approximately 4 mg of intra-arterial Integrilin in order to prevent formation of platelet aggregation at the stented site. None was obvious on diagnostic catheter arteriograms. The exchange guidewire was then retrieved and removed. A final control arteriogram performed through the Benchmark catheter and just proximal to the right vertebral artery origin continued to demonstrate excellent flow through the proximal stent and extra cranially and intracranially. There continued to be excellent flow through the basilar artery into the posterior cerebral arteries, the superior cerebellar arteries with faint opacification of both anterior-inferior cerebellar arteries. The Benchmark guide sheath was removed. The radial sheath was then removed with successful hemostasis with a wrist band at the right radial puncture site. Distal right radial pulse was verified to be present. A CT of the brain performed on the table  demonstrated no evidence of hemorrhage or hydrocephalus. The patient was then extubated. Upon recovery, the patient was able to converse appropriately. He denied any nausea, vomiting or headaches. He was able to move all 4 extremities spontaneously and to command. His pupils were 2 mm equal and sluggish. No change was seen in the right facial droop. The tongue remained in the midline. The patient was then returned to his room in the ICU in stable condition to continue on low-dose IV heparin and 81 aspirin daily, and Brilinta 90 mg b.i.d. Early in the evening, the patient was more awake, alert and appropriately responsive. He continued to have dysarthria with right facial droop. Neurological examination continued to have non sustained nystagmus on right lateral gaze and to a lesser degree left lateral gaze with oscillopsia on the right lateral gaze unchanged. 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 PLAN: Follow-up in clinic 2 weeks post discharge. Electronically Signed   By: Luanne Bras M.D.   On: 09/02/2021 05:39   IR CT Head Ltd  Result Date: 09/02/2021 CLINICAL DATA:  History of progressive onset of dysarthria, nausea, vomiting and left-sided numbness. Patient also developed diplopia. 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 EXAM: INTRACRANIAL STENT (INCL PTA) COMPARISON:  CT angiogram of the head  and neck of August 29, 2021, and MRI of the brain of August 29, 2021. MEDICATIONS: Heparin 4,000 units IV. Ancef 2 g IV antibiotic was administered within 1 hour of the procedure. ANESTHESIA/SEDATION: Mac  anesthesia as per Department of Anesthesiology for diagnostic portion followed by general anesthesia for endovascular treatment portion. CONTRAST:  Omnipaque 300 approximately 150 mL. FLUOROSCOPY TIME:  Fluoroscopy Time: 41 minutes 18 seconds (2310 mGy). COMPLICATIONS: None immediate. TECHNIQUE: Informed written consent was obtained from the patient after a thorough discussion of the procedural risks, benefits and alternatives. All questions were addressed. Maximal Sterile Barrier Technique was utilized including caps, mask, sterile gowns, sterile gloves, sterile drape, hand hygiene and skin antiseptic. A timeout was performed prior to the initiation of the procedure. The right forearm to the wrist was prepped and draped in the usual sterile manner. The right radial artery was then identified with ultrasound and its morphology documented. A dorsal palmar anastomosis was verified to be present. Using ultrasound guidance, and a micropuncture set access into the right radial artery was obtained with a 6/7 French radial sheath. The obturator, and the guidewire were removed. Good aspiration was obtained from the side port of the sheath. Cocktail of 2000 units of heparin, 2.5 mg of verapamil, and 200 mcg of nitroglycerin was then infused through the sheath in diluted form without event. A right radial arteriogram was performed. Over a 0.035 inch Roadrunner guidewire, a 5 [place] Simmons 2 diagnostic catheter was advanced to the aortic arch region, and arteriograms were performed of the right common carotid artery, the left common carotid artery, the left vertebral artery, and the right subclavian artery at the origin of stump of the occluded right vertebral artery. FINDINGS: The diagnostic study was somewhat marred by persistent motion artifact. The left common carotid arteriogram demonstrates the left external carotid artery and its major branches to be widely patent. The left internal carotid artery at the bulb  demonstrates approximately 50% stenosis without evidence of intraluminal filling defects. More distally, the left internal carotid artery is seen to opacify to the cranial skull base. 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 A left posterior communicating artery is seen opacifying the left posterior cerebral artery distribution. The left middle cerebral artery and the left anterior cerebral artery opacify into the capillary and venous phases. Non dominant left vertebral artery demonstrates patency at its origin with brisk ascent to the cranial skull base to supply primarily the ipsilateral left posteroinferior cerebellar artery. The right common carotid arteriogram demonstrates the right external carotid artery and its major branches to be widely patent. The right internal carotid artery at the bulb has approximately 90% stenosis due to a circumferential smooth plaque. More distally, the vessel is seen to opacify to the cranial skull base. The petrous, the cavernous and the supraclinoid segments are widely patent. A right posterior communicating artery is seen opacifying the right posterior cerebral artery distribution. The right middle and the right anterior cerebral arteries opacify into the capillary and venous phases. The right subclavian arteriogram at the origin of the occluded right vertebral artery demonstrates partial reconstitution of the right vertebral artery at the level of C3-C4 from collaterals arising from the cervical branch of the thyrocervical trunk with opacification noted more distally into the dominant right vertebrobasilar junction. Opacification is noted into the basilar artery, and retrogradely in the right vertebral artery to just above the site of the occlusion in the  proximal right vertebral artery. ENDOVASCULAR REVASCULARIZATION OF OCCLUDED DOMINANT PROXIMAL RIGHT  VERTEBRAL ARTERY WITH STENT ASSISTED ANGIOPLASTY, AND OF THE MID BASILAR ARTERY WITH STENT ASSISTED ANGIOPLASTY Over the exchange 035 inch 300 cm Rosen exchange guidewire, the combination of a 95 cm Benchmark guide catheter with a support 5.5 [place] Berenstein catheter was advanced and positioned just proximal to the stump of the occluded dominant right vertebral artery. The guidewire and the support catheter were gently retrieved and removed. Good aspiration was obtained from the hub of the Benchmark guide catheter. Bilateral roadmap was then obtained centered over the vertebral artery origin and distally through the [facility] guide catheter. Over an 0.014 inch standard Synchro micro guidewire with a moderate J configuration, inside of an 021 160 cm Trevo Trak microcatheter combination was advanced to just proximal to the occluded dominant right vertebral artery. Using a torque device with gentle manipulation access was obtained with the micro guidewire through the occluded right vertebral artery without difficulty. The guidewire was advanced to the vertebral artery followed by the microcatheter. The guidewire was removed. Good aspiration obtained from the hub of microcatheter. A control arteriogram was then performed through the microcatheter distally with opacification noted in the right vertebrobasilar junction, the basilar artery, the right posterior cerebral artery, the superior cerebellar arteries and the anterior-inferior cerebellar arteries. Also noted was significant narrowing secondary to an eccentric arteriosclerotic plaque in the mid basilar artery just proximal to the origin of right anterior inferior cerebellar artery. The microcatheter was then exchanged under constant fluoroscopic guidance for an 014 inch 300 cm Synchro standard micro guidewire with a moderate J configuration at its tip. Measurements were then performed of the most proximal portion of the right vertebral artery from the previous  diagnostic arteriogram. It was decided to proceed with initial balloon angioplasty of the origin of the occluded right vertebral artery with a 2.5 mm x 15 mm Apex Monorail angioplasty balloon. This was prepped and purged antegradely with heparinized saline infusion, and retrogradely with 75% contrast and 25% heparinized saline infusion. Using rapid exchange technique, this was then advanced and positioned at the origin of the occluded right vertebral artery with the proximal marker proximal to the origin of the right vertebral artery. Control inflation was then performed using micro inflation syringe device via micro tubing to approximately 8 atmospheres where it was maintained for approximately 15 seconds. The balloon was deflated and retrieved and removed. Control arteriogram performed through [facility] guide catheter demonstrated significantly improved caliber and flow through the angioplastied segment with copious flow noted intracranially. 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 Patency was noted of the posterior cerebral arteries, the superior cerebellar arteries and faintly the anterior-inferior cerebellar arteries bilaterally. Significantly improved caliber was also noted at the origin of the right vertebral artery. A 120 cm Phenom Plus catheter was then advanced over the exchange micro guidewire under fluoroscopic guidance and positioned in the distal right vertebral artery. The Benchmark catheter was now advanced without difficulty into the distal right vertebral artery also. A roadmap was then obtained. Using a torque device, the exchange micro guidewire was safely advanced under fluoroscopic guidance into the proximal basilar artery and then the distal basilar artery into the right posterior cerebral artery P1 segment. Measurements were then performed of the basilar artery distal and proximal to the prominent stenosis. It was  decided to proceed with placement of a Resolute Onyx 3 mm x 15 mm balloon mounted stent intracranially. However,  the stented stent delivery apparatus could not be advanced through the 045 Phenom Plus catheter. The Phenom Plus catheter was removed with the wire maintained in the right P1 segment. The Benchmark guide catheter was easily then advanced into the right vertebrobasilar junction just proximal to the basilar artery. The Resolute Onyx which had been prepped with heparinized saline infusion retrogradely and angiographically with 60% contrast and 40% heparinized saline infusion was reintroduced and advanced without difficulty over the exchange wire to the distal end of the Benchmark guide catheter. This stent apparatus was easily advanced and positioned with the proximal and the distal markers adequate distant from the site of the significant mid basilar artery stenosis. The stent was then deployed by inflating balloon using a micro inflation syringe device via micro tubing. Balloon was expanded to approximately 2.9 mm. The balloon was then deflated and retrieved. A control arteriogram performed through the Benchmark guide catheter demonstrated excellent apposition and flow through the stented mid basilar artery. Patency was seen of the posterior cerebral arteries, the superior cerebellar arteries and faintly the anterior-inferior cerebellar arteries. The Benchmark catheter was then retrieved more proximally while the exchange wire was retrieved into the V4 segment of the right vertebral artery. The Benchmark catheter was retrieved to just proximal to the origin of the right vertebral artery. Measurements were again performed of the vertebral artery at this site. It was decided to proceed with placement of a 3.5 mm x 18 mm Onyx Frontier balloon mountable stent. After having been prepped in the usual manner, this was then advanced and positioned such that the proximal portion of the device was just proximal to  the origin of the right vertebral artery. Thereafter, controlled inflation was performed of the balloon mountable stent to approximately 8 atmospheres. This was maintained for approximately 10 seconds. The balloon was then deflated and retrieved and removed. A control arteriogram performed through the Benchmark guide catheter demonstrated excellent apposition and coverage of the previously occluded right vertebral artery origin and proximal portion. Free flow was now noted through the proximal stent into the intracranial portion with patency maintained of the positioned mid basilar artery stent. Control arteriograms were then performed at 15 and 25 minutes post deployment of the devices. These continued to demonstrate excellent apposition with flow extra cranially and intracranially. The patient at this time was also given approximately 4 mg of intra-arterial Integrilin in order to prevent formation of platelet aggregation at the stented site. None was obvious on diagnostic catheter arteriograms. The exchange guidewire was then retrieved and removed. A final control arteriogram performed through the Benchmark catheter and just proximal to the right vertebral artery origin continued to demonstrate excellent flow through the proximal stent and extra cranially and intracranially. There continued to be excellent flow through the basilar artery into the posterior cerebral arteries, the superior cerebellar arteries with faint opacification of both anterior-inferior cerebellar arteries. The Benchmark guide sheath was removed. The radial sheath was then removed with successful hemostasis with a wrist band at the right radial puncture site. Distal right radial pulse was verified to be present. A CT of the brain performed on the table demonstrated no evidence of hemorrhage or hydrocephalus. The patient was then extubated. Upon recovery, the patient was able to converse appropriately. He denied any nausea, vomiting or  headaches. He was able to move all 4 extremities spontaneously and to command. His pupils were 2 mm equal and sluggish. No change was seen in the right facial droop. The tongue remained in the  midline. The patient was then returned to his room in the ICU in stable condition to continue on low-dose IV heparin and 81 aspirin daily, and Brilinta 90 mg b.i.d. Early in the evening, the patient was more awake, alert and appropriately responsive. He continued to have dysarthria with right facial droop. Neurological examination continued to have non sustained nystagmus on right lateral gaze and to a lesser degree left lateral gaze with oscillopsia on the right lateral gaze unchanged. 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 PLAN: Follow-up in clinic 2 weeks post discharge. Electronically Signed   By: Luanne Bras M.D.   On: 09/02/2021 05:39   IR US Guide Vasc Access Right  Result Date: 09/02/2021 CLINICAL DATA:  History of progressive onset of dysarthria, nausea, vomiting and left-sided numbness. Patient also developed diplopia. 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 EXAM: INTRACRANIAL STENT (INCL PTA) COMPARISON:  CT angiogram of the head and neck of August 29, 2021, and MRI of the brain of August 29, 2021. MEDICATIONS: Heparin 4,000 units IV. Ancef 2 g IV antibiotic was administered within 1 hour of the procedure. ANESTHESIA/SEDATION: Mac anesthesia as per Department of Anesthesiology for diagnostic portion followed by general anesthesia for endovascular treatment portion. CONTRAST:  Omnipaque 300 approximately  150 mL. FLUOROSCOPY TIME:  Fluoroscopy Time: 41 minutes 18 seconds (2310 mGy). COMPLICATIONS: None immediate. TECHNIQUE: Informed written consent was obtained from the patient after a thorough discussion of the procedural risks, benefits and alternatives. All questions were addressed. Maximal Sterile Barrier Technique was utilized including caps, mask, sterile gowns, sterile gloves, sterile drape, hand hygiene and skin antiseptic. A timeout was performed prior to the initiation of the procedure. The right forearm to the wrist was prepped and draped in the usual sterile manner. The right radial artery was then identified with ultrasound and its morphology documented. A dorsal palmar anastomosis was verified to be present. Using ultrasound guidance, and a micropuncture set access into the right radial artery was obtained with a 6/7 French radial sheath. The obturator, and the guidewire were removed. Good aspiration was obtained from the side port of the sheath. Cocktail of 2000 units of heparin, 2.5 mg of verapamil, and 200 mcg of nitroglycerin was then infused through the sheath in diluted form without event. A right radial arteriogram was performed. Over a 0.035 inch Roadrunner guidewire, a 5 [place] Simmons 2 diagnostic catheter was advanced to the aortic arch region, and arteriograms were performed of the right common carotid artery, the left common carotid artery, the left vertebral artery, and the right subclavian artery at the origin of stump of the occluded right vertebral artery. FINDINGS: The diagnostic study was somewhat marred by persistent motion artifact. The left common carotid arteriogram demonstrates the left external carotid artery and its major branches to be widely patent. The left internal carotid artery at the bulb demonstrates approximately 50% stenosis without evidence of intraluminal filling defects. More distally, the left internal carotid artery is seen to opacify to the cranial skull base.  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 A left posterior communicating artery is seen opacifying the left posterior cerebral  artery distribution. The left middle cerebral artery and the left anterior cerebral artery opacify into the capillary and venous phases. Non dominant left vertebral artery demonstrates patency at its origin with brisk ascent to the cranial skull base to supply primarily the ipsilateral left posteroinferior cerebellar artery. The right common carotid arteriogram demonstrates the right external carotid artery and its major branches to be widely patent. The right internal carotid artery at the bulb has approximately 90% stenosis due to a circumferential smooth plaque. More distally, the vessel is seen to opacify to the cranial skull base. The petrous, the cavernous and the supraclinoid segments are widely patent. A right posterior communicating artery is seen opacifying the right posterior cerebral artery distribution. The right middle and the right anterior cerebral arteries opacify into the capillary and venous phases. The right subclavian arteriogram at the origin of the occluded right vertebral artery demonstrates partial reconstitution of the right vertebral artery at the level of C3-C4 from collaterals arising from the cervical branch of the thyrocervical trunk with opacification noted more distally into the dominant right vertebrobasilar junction. Opacification is noted into the basilar artery, and retrogradely in the right vertebral artery to just above the site of the occlusion in the proximal right vertebral artery. ENDOVASCULAR REVASCULARIZATION OF OCCLUDED DOMINANT PROXIMAL RIGHT VERTEBRAL ARTERY WITH STENT ASSISTED ANGIOPLASTY, AND OF THE MID BASILAR ARTERY WITH STENT ASSISTED ANGIOPLASTY Over the exchange 035 inch 300 cm Rosen exchange guidewire, the combination of a  95 cm Benchmark guide catheter with a support 5.5 [place] Berenstein catheter was advanced and positioned just proximal to the stump of the occluded dominant right vertebral artery. The guidewire and the support catheter were gently retrieved and removed. Good aspiration was obtained from the hub of the Benchmark guide catheter. Bilateral roadmap was then obtained centered over the vertebral artery origin and distally through the [facility] guide catheter. Over an 0.014 inch standard Synchro micro guidewire with a moderate J configuration, inside of an 021 160 cm Trevo Trak microcatheter combination was advanced to just proximal to the occluded dominant right vertebral artery. Using a torque device with gentle manipulation access was obtained with the micro guidewire through the occluded right vertebral artery without difficulty. The guidewire was advanced to the vertebral artery followed by the microcatheter. The guidewire was removed. Good aspiration obtained from the hub of microcatheter. A control arteriogram was then performed through the microcatheter distally with opacification noted in the right vertebrobasilar junction, the basilar artery, the right posterior cerebral artery, the superior cerebellar arteries and the anterior-inferior cerebellar arteries. Also noted was significant narrowing secondary to an eccentric arteriosclerotic plaque in the mid basilar artery just proximal to the origin of right anterior inferior cerebellar artery. The microcatheter was then exchanged under constant fluoroscopic guidance for an 014 inch 300 cm Synchro standard micro guidewire with a moderate J configuration at its tip. Measurements were then performed of the most proximal portion of the right vertebral artery from the previous diagnostic arteriogram. It was decided to proceed with initial balloon angioplasty of the origin of the occluded right vertebral artery with a 2.5 mm x 15 mm Apex Monorail angioplasty balloon.  This was prepped and purged antegradely with heparinized saline infusion, and retrogradely with 75% contrast and 25% heparinized saline infusion. Using rapid exchange technique, this was then advanced and positioned at the origin of the occluded right vertebral artery with the proximal marker proximal to the origin of the right vertebral artery. Control inflation was then performed using micro inflation  syringe device via micro tubing to approximately 8 atmospheres where it was maintained for approximately 15 seconds. The balloon was deflated and retrieved and removed. Control arteriogram performed through [facility] guide catheter demonstrated significantly improved caliber and flow through the angioplastied segment with copious flow noted intracranially. 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 Patency was noted of the posterior cerebral arteries, the superior cerebellar arteries and faintly the anterior-inferior cerebellar arteries bilaterally. Significantly improved caliber was also noted at the origin of the right vertebral artery. A 120 cm Phenom Plus catheter was then advanced over the exchange micro guidewire under fluoroscopic guidance and positioned in the distal right vertebral artery. The Benchmark catheter was now advanced without difficulty into the distal right vertebral artery also. A roadmap was then obtained. Using a torque device, the exchange micro guidewire was safely advanced under fluoroscopic guidance into the proximal basilar artery and then the distal basilar artery into the right posterior cerebral artery P1 segment. Measurements were then performed of the basilar artery distal and proximal to the prominent stenosis. It was decided to proceed with placement of a Resolute Onyx 3 mm x 15 mm balloon mounted stent intracranially. However, the stented stent delivery apparatus could not be advanced through the 045 Phenom  Plus catheter. The Phenom Plus catheter was removed with the wire maintained in the right P1 segment. The Benchmark guide catheter was easily then advanced into the right vertebrobasilar junction just proximal to the basilar artery. The Resolute Onyx which had been prepped with heparinized saline infusion retrogradely and angiographically with 60% contrast and 40% heparinized saline infusion was reintroduced and advanced without difficulty over the exchange wire to the distal end of the Benchmark guide catheter. This stent apparatus was easily advanced and positioned with the proximal and the distal markers adequate distant from the site of the significant mid basilar artery stenosis. The stent was then deployed by inflating balloon using a micro inflation syringe device via micro tubing. Balloon was expanded to approximately 2.9 mm. The balloon was then deflated and retrieved. A control arteriogram performed through the Benchmark guide catheter demonstrated excellent apposition and flow through the stented mid basilar artery. Patency was seen of the posterior cerebral arteries, the superior cerebellar arteries and faintly the anterior-inferior cerebellar arteries. The Benchmark catheter was then retrieved more proximally while the exchange wire was retrieved into the V4 segment of the right vertebral artery. The Benchmark catheter was retrieved to just proximal to the origin of the right vertebral artery. Measurements were again performed of the vertebral artery at this site. It was decided to proceed with placement of a 3.5 mm x 18 mm Onyx Frontier balloon mountable stent. After having been prepped in the usual manner, this was then advanced and positioned such that the proximal portion of the device was just proximal to the origin of the right vertebral artery. Thereafter, controlled inflation was performed of the balloon mountable stent to approximately 8 atmospheres. This was maintained for approximately 10  seconds. The balloon was then deflated and retrieved and removed. A control arteriogram performed through the Benchmark guide catheter demonstrated excellent apposition and coverage of the previously occluded right vertebral artery origin and proximal portion. Free flow was now noted through the proximal stent into the intracranial portion with patency maintained of the positioned mid basilar artery stent. Control arteriograms were then performed at 15 and 25 minutes post deployment of the devices. These continued to demonstrate excellent apposition with flow  extra cranially and intracranially. The patient at this time was also given approximately 4 mg of intra-arterial Integrilin in order to prevent formation of platelet aggregation at the stented site. None was obvious on diagnostic catheter arteriograms. The exchange guidewire was then retrieved and removed. A final control arteriogram performed through the Benchmark catheter and just proximal to the right vertebral artery origin continued to demonstrate excellent flow through the proximal stent and extra cranially and intracranially. There continued to be excellent flow through the basilar artery into the posterior cerebral arteries, the superior cerebellar arteries with faint opacification of both anterior-inferior cerebellar arteries. The Benchmark guide sheath was removed. The radial sheath was then removed with successful hemostasis with a wrist band at the right radial puncture site. Distal right radial pulse was verified to be present. A CT of the brain performed on the table demonstrated no evidence of hemorrhage or hydrocephalus. The patient was then extubated. Upon recovery, the patient was able to converse appropriately. He denied any nausea, vomiting or headaches. He was able to move all 4 extremities spontaneously and to command. His pupils were 2 mm equal and sluggish. No change was seen in the right facial droop. The tongue remained in the midline.  The patient was then returned to his room in the ICU in stable condition to continue on low-dose IV heparin and 81 aspirin daily, and Brilinta 90 mg b.i.d. Early in the evening, the patient was more awake, alert and appropriately responsive. He continued to have dysarthria with right facial droop. Neurological examination continued to have non sustained nystagmus on right lateral gaze and to a lesser degree left lateral gaze with oscillopsia on the right lateral gaze unchanged. 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 PLAN: Follow-up in clinic 2 weeks post discharge. Electronically Signed   By: Luanne Bras M.D.   On: 09/02/2021 05:39   CT CEREBRAL PERFUSION W CONTRAST  Result Date: 08/29/2021 CLINICAL DATA:  Neuro deficit, acute, stroke suspected EXAM: CT ANGIOGRAPHY HEAD AND NECK CT PERFUSION BRAIN TECHNIQUE: Multidetector CT imaging of the head and neck was performed using the standard protocol during bolus administration of intravenous contrast. Multiplanar CT image reconstructions and MIPs were obtained to evaluate the vascular anatomy. Carotid stenosis measurements (when applicable) are obtained utilizing NASCET criteria, using the distal internal carotid diameter as the denominator. Multiphase CT imaging of the brain was performed following IV bolus contrast injection. Subsequent parametric perfusion maps were calculated using RAPID software. CONTRAST:  100 mL Omnipaque 350 COMPARISON:  None. FINDINGS: CTA NECK Aortic arch: Great vessel origins are patent. Right carotid system: Patent. Noncalcified plaque at the ICA origin causes 65% stenosis. Left carotid system: Patent. Mixed plaque at the ICA origin causes less than 50% stenosis. Vertebral arteries: Left vertebral artery is  patent. Absent opacification of the proximal right vertebral artery at the origin with minimal reconstitution of the V1 segment followed by reocclusion. The V2 segment is occluded proximally with reconstitution at the C4 level. Skeleton: Mild cervical spine degenerative changes, greatest at C6-C7. Other neck: Unremarkable. Upper chest: No apical lung mass. Review of the MIP images confirms the above findings CTA HEAD Anterior circulation: Intracranial internal carotid arteries are patent with mild calcified plaque. Anterior and middle cerebral arteries are patent. Posterior circulation: There is decreased caliber of the intracranial left vertebral artery throughout  its course. Appears to terminate at patent left PICA. Intracranial right vertebral artery is patent. Basilar is patent but there is low density within the lumen proximally. Superior cerebellar artery origins are patent. Bilateral posterior communicating arteries are present. Posterior cerebral arteries are patent. Venous sinuses: As permitted by contrast timing, patent. Review of the MIP images confirms the above findings CT Brain Perfusion Findings: CBF (<30%) Volume: 57m Perfusion (Tmax>6.0s) volume: 066mMismatch Volume: 69m72mnfarction Location: None. IMPRESSION: Abnormal extracranial right vertebral artery with limited opacification proximally and reconstitution at the C4 level. May reflect high-grade origin stenosis. Right PICA or like AICA origins are not identified. Basilar artery is patent but there is question of nonocclusive clot proximally. Noncalcified plaque at the right ICA origin causes 65% stenosis. Mixed plaque at the left ICA origin causes less than 50% stenosis. Diffuse mild narrowing of the intracranial left vertebral artery, which terminates as a PICA. Perfusion imaging demonstrates no evidence of core infarction or penumbra, but there is limited evaluation of the posterior fossa. Initial results were provided by telephone at the time  of interpretation on 08/29/2021 at 10:20 am to [facility]. Electronically Signed   By: PraMacy MisD.   On: 08/29/2021 10:44   ECHOCARDIOGRAM COMPLETE  Result Date: 08/30/2021    ECHOCARDIOGRAM REPORT   Patient Name:   REIHATCHERY Date of Exam: 08/30/2021 Medical Rec #:  [id number]:       72.0 in Accession #:    [id number]:       202.8 lb Date of Birth:  1/109-09-1957BSA:          2.143 m Patient Age:    [age]   BP:           155/93 mmHg Patient Gender: M          HR:           76 bpm. Exam Location:  Inpatient Procedure: 2D Echo, Color Doppler and Cardiac Doppler Indications:    Eval for PFO  History:        Patient has no prior history of Echocardiogram examinations.  Sonographer:    TayJyl Heinzferring Phys: [id number]. Left ventricular ejection fraction, by estimation, is 60 to 65%. The left ventricle has normal function. The left ventricle has no regional wall motion abnormalities. Left ventricular diastolic parameters are consistent with Grade II diastolic dysfunction (pseudonormalization).  2. Right ventricular systolic function is normal. The right ventricular size is normal. Tricuspid regurgitation signal is inadequate for assessing PA pressure.  3. The mitral valve is normal in structure. No evidence of mitral valve regurgitation. No evidence of mitral stenosis.  4. The aortic valve is tricuspid. Aortic valve regurgitation is not visualized. Mild aortic valve sclerosis is present, with no evidence of aortic valve stenosis.  5. The inferior vena cava is normal in size with greater than 50% respiratory variability, suggesting right atrial pressure of 3 mmHg. FINDINGS  Left Ventricle: Left ventricular ejection fraction, by estimation, is 60 to 65%. The left ventricle has normal function. The left ventricle has no regional wall motion abnormalities. The left ventricular internal cavity size was normal in  size. There is  no left ventricular hypertrophy. Left ventricular diastolic parameters are consistent with Grade II diastolic dysfunction (pseudonormalization). Right Ventricle: The right ventricular size is normal. No increase in right ventricular wall thickness. Right ventricular systolic function is normal. Tricuspid regurgitation  signal is inadequate for assessing PA pressure. Left Atrium: Left atrial size was normal in size. Right Atrium: Right atrial size was normal in size. Pericardium: There is no evidence of pericardial effusion. Mitral Valve: The mitral valve is normal in structure. No evidence of mitral valve regurgitation. No evidence of mitral valve stenosis. Tricuspid Valve: The tricuspid valve is normal in structure. Tricuspid valve regurgitation is not demonstrated. Aortic Valve: The aortic valve is tricuspid. Aortic valve regurgitation is not visualized. Mild aortic valve sclerosis is present, with no evidence of aortic valve stenosis. Aortic valve peak gradient measures 9.4 mmHg. Pulmonic Valve: The pulmonic valve was normal in structure. Pulmonic valve regurgitation is trivial. Aorta: The aortic root is normal in size and structure. Venous: The inferior vena cava is normal in size with greater than 50% respiratory variability, suggesting right atrial pressure of 3 mmHg. IAS/Shunts: No atrial level shunt detected by color flow Doppler.  LEFT VENTRICLE PLAX 2D LVIDd:         4.40 cm      Diastology LVIDs:         2.80 cm      LV e' medial:    7.07 cm/s LV PW:         1.00 cm      LV E/e' medial:  8.9 LV IVS:        1.00 cm      LV e' lateral:   6.85 cm/s LVOT diam:     2.10 cm      LV E/e' lateral: 9.2 LV SV:         84 LV SV Index:   39 LVOT Area:     3.46 cm  LV Volumes (MOD) LV vol d, MOD A2C: 96.1 ml LV vol d, MOD A4C: 107.0 ml LV vol s, MOD A2C: 43.0 ml LV vol s, MOD A4C: 44.4 ml LV SV MOD A2C:     53.1 ml LV SV MOD A4C:     107.0 ml LV SV MOD BP:      58.1 ml RIGHT VENTRICLE             IVC  RV Basal diam:  2.60 cm     IVC diam: 1.70 cm RV Mid diam:    2.50 cm RV S prime:     15.40 cm/s TAPSE (M-mode): 2.5 cm LEFT ATRIUM           Index        RIGHT ATRIUM           Index LA diam:      3.60 cm 1.68 cm/m   RA Area:     14.90 cm LA Vol (A2C): 37.2 ml 17.36 ml/m  RA Volume:   29.30 ml  13.67 ml/m LA Vol (A4C): 50.2 ml 23.42 ml/m  AORTIC VALVE AV Area (Vmax): 2.94 cm AV Vmax:        153.00 cm/s AV Peak Grad:   9.4 mmHg LVOT Vmax:      130.00 cm/s LVOT Vmean:     73.000 cm/s LVOT VTI:       0.243 m  AORTA Ao Root diam: 2.90 cm Ao Asc diam:  3.40 cm MITRAL VALVE MV Area (PHT): 3.31 cm    SHUNTS MV Decel Time: 229 msec    Systemic VTI:  0.24 m MV E velocity: 62.90 cm/s  Systemic Diam: 2.10 cm MV A velocity: 67.00 cm/s MV E/A ratio:  0.94 [employer] Electronically signed by Franki Monte  Signature Date/Time: 08/30/2021/2:45:21 PM    Final    CT HEAD CODE STROKE WO CONTRAST  Result Date: 08/29/2021 CLINICAL DATA:  Code stroke.  Left-sided weakness EXAM: CT HEAD WITHOUT CONTRAST TECHNIQUE: Contiguous axial images were obtained from the base of the skull through the vertex without intravenous contrast. COMPARISON:  None. FINDINGS: Brain: There is no acute intracranial hemorrhage, mass effect, or edema. Gray-white differentiation is preserved. There is an age-indeterminate small infarct of the right inferior cerebellum. Ventricles and sulci are normal in size and configuration. No extra-axial collection. Vascular: No hyperdense vessel. Intracranial atherosclerotic calcification at the skull base. Skull: Unremarkable. Sinuses/Orbits: Lobular mucosal thickening. Orbits are unremarkable. Other: Mastoid air cells are clear. ASPECTS [facility] Stroke Program Early CT Score) - Ganglionic level infarction (caudate, lentiform nuclei, internal capsule, insula, M1-M3 cortex): 7 - Supraganglionic infarction (M4-M6 cortex): 3 Total score (0-10 with 10 being normal): 10 IMPRESSION: There is no acute intracranial  hemorrhage. ASPECT score is 10. 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 These results were communicated to Dr. Rory Percy at 9:44 am on 08/29/2021 by text page via the [facility] messaging system. Electronically Signed   By: Macy Mis M.D.   On: 08/29/2021 09:46   VAS [place] LOWER EXTREMITY VENOUS (DVT)  Result Date: 08/31/2021  Lower Venous DVT Study Patient Name:  DREVION OFFORD  Date of Exam:   08/31/2021 Medical Rec #: [id number]   Accession #:    [id number] Date of Birth: Jun 30, 1956    Patient Gender: M Patient Age:   20 years Exam Location:  [facility] Procedure:      VAS [place] LOWER EXTREMITY VENOUS (DVT) Referring Phys: Cornelius Moras XU --------------------------------------------------------------------------------  Indications: Stroke.  Comparison Study: no prior Performing Technologist: Archie Patten RVS  Examination Guidelines: A complete evaluation includes B-mode imaging, spectral Doppler, color Doppler, and power Doppler as needed of all accessible portions of each vessel. Bilateral testing is considered an integral part of a complete examination. Limited examinations for reoccurring indications may be performed as noted. The reflux portion of the exam is performed with the patient in reverse Trendelenburg.  +---------+---------------+---------+-----------+----------+--------------+ RIGHT    CompressibilityPhasicitySpontaneityPropertiesThrombus Aging +---------+---------------+---------+-----------+----------+--------------+ CFV      Full           Yes      Yes                                 +---------+---------------+---------+-----------+----------+--------------+ SFJ      Full                                                        +---------+---------------+---------+-----------+----------+--------------+ FV Prox  Full                                                        +---------+---------------+---------+-----------+----------+--------------+ FV Mid   Full                                                         +---------+---------------+---------+-----------+----------+--------------+  FV DistalFull                                                        +---------+---------------+---------+-----------+----------+--------------+ PFV      Full                                                        +---------+---------------+---------+-----------+----------+--------------+ POP      Full           Yes      Yes                                 +---------+---------------+---------+-----------+----------+--------------+ PTV      Full                                                        +---------+---------------+---------+-----------+----------+--------------+ PERO     Full                                                        +---------+---------------+---------+-----------+----------+--------------+   +---------+---------------+---------+-----------+----------+--------------+ LEFT     CompressibilityPhasicitySpontaneityPropertiesThrombus Aging +---------+---------------+---------+-----------+----------+--------------+ CFV      Full           Yes      Yes                                 +---------+---------------+---------+-----------+----------+--------------+ SFJ      Full                                                        +---------+---------------+---------+-----------+----------+--------------+ FV Prox  Full                                                        +---------+---------------+---------+-----------+----------+--------------+ FV Mid   Full                                                        +---------+---------------+---------+-----------+----------+--------------+ FV DistalFull                                                        +---------+---------------+---------+-----------+----------+--------------+   PFV      Full                                                         +---------+---------------+---------+-----------+----------+--------------+ POP      Full           Yes      Yes                                 +---------+---------------+---------+-----------+----------+--------------+ PTV      Full                                                        +---------+---------------+---------+-----------+----------+--------------+ PERO     Full                                                        +---------+---------------+---------+-----------+----------+--------------+     Summary: BILATERAL: - No evidence of deep vein thrombosis seen in the lower extremities, bilaterally. - No evidence of superficial venous thrombosis in the lower extremities, bilaterally. -   *See table(s) above for measurements and observations. Electronically signed by Monica Martinez MD on 08/31/2021 at 12:34:17 PM.    Final    CT ANGIO HEAD NECK W WO CM (CODE STROKE)  Result Date: 08/29/2021 CLINICAL DATA:  Neuro deficit, acute, stroke suspected EXAM: CT ANGIOGRAPHY HEAD AND NECK CT PERFUSION BRAIN TECHNIQUE: Multidetector CT imaging of the head and neck was performed using the standard protocol during bolus administration of intravenous contrast. Multiplanar CT image reconstructions and MIPs were obtained to evaluate the vascular anatomy. Carotid stenosis measurements (when applicable) are obtained utilizing NASCET criteria, using the distal internal carotid diameter as the denominator. Multiphase CT imaging of the brain was performed following IV bolus contrast injection. Subsequent parametric perfusion maps were calculated using RAPID software. CONTRAST:  100 mL Omnipaque 350 COMPARISON:  None. FINDINGS: CTA NECK Aortic arch: Great vessel origins are patent. Right carotid system: Patent. Noncalcified plaque at the ICA origin causes 65% stenosis. Left carotid system: Patent. Mixed plaque at the ICA origin causes less than 50% stenosis. Vertebral arteries: Left vertebral  artery is patent. Absent opacification of the proximal right vertebral artery at the origin with minimal reconstitution of the V1 segment followed by reocclusion. The V2 segment is occluded proximally with reconstitution at the C4 level. Skeleton: Mild cervical spine degenerative changes, greatest at C6-C7. Other neck: Unremarkable. Upper chest: No apical lung mass. Review of the MIP images confirms the above findings CTA HEAD Anterior circulation: Intracranial internal carotid arteries are patent with mild calcified plaque. Anterior and middle cerebral arteries are patent. Posterior circulation: There is decreased caliber of the intracranial left vertebral artery throughout its course. Appears to terminate at patent left PICA. Intracranial right vertebral artery is patent. Basilar is patent but there is low density within the lumen proximally. Superior cerebellar artery origins are patent. Bilateral posterior communicating arteries are present. Posterior cerebral  arteries are patent. Venous sinuses: As permitted by contrast timing, patent. Review of the MIP images confirms the above findings CT Brain Perfusion Findings: CBF (<30%) Volume: 29m Perfusion (Tmax>6.0s) volume: 023mMismatch Volume: 65m68mnfarction Location: None. IMPRESSION: Abnormal extracranial right vertebral artery with limited opacification proximally and reconstitution at the C4 level. May reflect high-grade origin stenosis. Right PICA or like AICA origins are not identified. Basilar artery is patent but there is question of nonocclusive clot proximally. Noncalcified plaque at the right ICA origin causes 65% stenosis. Mixed plaque at the left ICA origin causes less than 50% stenosis. Diffuse mild narrowing of the intracranial left vertebral artery, which terminates as a PICA. Perfusion imaging demonstrates no evidence of core infarction or penumbra, but there is limited evaluation of the posterior fossa. Initial results were provided by telephone at  the time of interpretation on 08/29/2021 at 10:20 am to [facility] these results. Electronically Signed   By: PraMacy MisD.   On: 08/29/2021 10:44   IR ANGIO VERTEBRAL SEL SUBCLAVIAN INNOMINATE UNI L MOD SED  Result Date: 09/02/2021 CLINICAL DATA:  History of progressive onset of dysarthria, nausea, vomiting and left-sided numbness. Patient also developed diplopia. 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 EXAM: INTRACRANIAL STENT (INCL PTA) COMPARISON:  CT angiogram of the head and neck of August 29, 2021, and MRI of the brain of August 29, 2021. MEDICATIONS: Heparin 4,000 units IV. Ancef 2 g IV antibiotic was administered within 1 hour of the procedure. ANESTHESIA/SEDATION: Mac anesthesia as per Department of Anesthesiology for diagnostic portion followed by general anesthesia for endovascular treatment portion. CONTRAST:  Omnipaque 300 approximately 150 mL. FLUOROSCOPY TIME:  Fluoroscopy Time: 41 minutes 18 seconds (2310 mGy). COMPLICATIONS: None immediate. TECHNIQUE: Informed written consent was obtained from the patient after a thorough discussion of the procedural risks, benefits and alternatives. All questions were addressed. Maximal Sterile Barrier Technique was utilized including caps, mask, sterile gowns, sterile gloves, sterile drape, hand hygiene and skin antiseptic. A timeout was performed prior to the initiation of the procedure. The right forearm to the wrist was prepped and draped in the usual sterile manner. The right radial artery was then identified with ultrasound and its morphology documented. A dorsal palmar anastomosis was verified to be present. Using ultrasound guidance, and a micropuncture set access into the right radial artery was obtained with a 6/7 French radial  sheath. The obturator, and the guidewire were removed. Good aspiration was obtained from the side port of the sheath. Cocktail of 2000 units of heparin, 2.5 mg of verapamil, and 200 mcg of nitroglycerin was then infused through the sheath in diluted form without event. A right radial arteriogram was performed. Over a 0.035 inch Roadrunner guidewire, a 5 Fre[place]ons 2 diagnostic catheter was advanced to the aortic arch region, and arteriograms were performed of the right common carotid artery, the left common carotid artery, the left vertebral artery, and the right subclavian artery at the origin of stump of the occluded right vertebral artery. FINDINGS: The diagnostic study was somewhat marred by persistent motion artifact. The left common carotid arteriogram demonstrates the left external carotid artery and its major branches to be widely patent. The left internal carotid artery at the bulb demonstrates approximately 50% stenosis without evidence of intraluminal filling defects. More distally, the left internal carotid artery is  seen to opacify to the cranial skull base. 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 A left posterior communicating artery is seen opacifying the left posterior cerebral artery distribution. The left middle cerebral artery and the left anterior cerebral artery opacify into the capillary and venous phases. Non dominant left vertebral artery demonstrates patency at its origin with brisk ascent to the cranial skull base to supply primarily the ipsilateral left posteroinferior cerebellar artery. The right common carotid arteriogram demonstrates the right external carotid artery and its major branches to be widely patent. The right internal carotid artery at the bulb has approximately 90% stenosis due to a circumferential smooth plaque. More distally, the vessel is seen to  opacify to the cranial skull base. The petrous, the cavernous and the supraclinoid segments are widely patent. A right posterior communicating artery is seen opacifying the right posterior cerebral artery distribution. The right middle and the right anterior cerebral arteries opacify into the capillary and venous phases. The right subclavian arteriogram at the origin of the occluded right vertebral artery demonstrates partial reconstitution of the right vertebral artery at the level of C3-C4 from collaterals arising from the cervical branch of the thyrocervical trunk with opacification noted more distally into the dominant right vertebrobasilar junction. Opacification is noted into the basilar artery, and retrogradely in the right vertebral artery to just above the site of the occlusion in the proximal right vertebral artery. ENDOVASCULAR REVASCULARIZATION OF OCCLUDED DOMINANT PROXIMAL RIGHT VERTEBRAL ARTERY WITH STENT ASSISTED ANGIOPLASTY, AND OF THE MID BASILAR ARTERY WITH STENT ASSISTED ANGIOPLASTY Over the exchange 035 inch 300 cm Rosen exchange guidewire, the combination of a 95 cm Benchmark guide catheter with a support 5.5 [place] Berenstein catheter was advanced and positioned just proximal to the stump of the occluded dominant right vertebral artery. The guidewire and the support catheter were gently retrieved and removed. Good aspiration was obtained from the hub of the Benchmark guide catheter. Bilateral roadmap was then obtained centered over the vertebral artery origin and distally through the [facility] guide catheter. Over an 0.014 inch standard Synchro micro guidewire with a moderate J configuration, inside of an 021 160 cm Trevo Trak microcatheter combination was advanced to just proximal to the occluded dominant right vertebral artery. Using a torque device with gentle manipulation access was obtained with the micro guidewire through the occluded right vertebral artery without difficulty. The  guidewire was advanced to the vertebral artery followed by the microcatheter. The guidewire was removed. Good aspiration obtained from the hub of microcatheter. A control arteriogram was then performed through the microcatheter distally with opacification noted in the right vertebrobasilar junction, the basilar artery, the right posterior cerebral artery, the superior cerebellar arteries and the anterior-inferior cerebellar arteries. Also noted was significant narrowing secondary to an eccentric arteriosclerotic plaque in the mid basilar artery just proximal to the origin of right anterior inferior cerebellar artery. The microcatheter was then exchanged under constant fluoroscopic guidance for an 014 inch 300 cm Synchro standard micro guidewire with a moderate J configuration at its tip. Measurements were then performed of the most proximal portion of the right vertebral artery from the previous diagnostic arteriogram. It was decided to proceed with initial balloon angioplasty of the origin of the occluded right vertebral artery with a 2.5 mm x 15 mm Apex Monorail angioplasty balloon. This was prepped and purged antegradely with heparinized saline infusion, and retrogradely with 75% contrast and 25% heparinized saline  infusion. Using rapid exchange technique, this was then advanced and positioned at the origin of the occluded right vertebral artery with the proximal marker proximal to the origin of the right vertebral artery. Control inflation was then performed using micro inflation syringe device via micro tubing to approximately 8 atmospheres where it was maintained for approximately 15 seconds. The balloon was deflated and retrieved and removed. Control arteriogram performed through [facility] guide catheter demonstrated significantly improved caliber and flow through the angioplastied segment with copious flow noted intracranially. Intracranially again demonstrated was significant mid basilar artery stenosis  secondary to a soft eccentric plaque. Patency was noted of the posterior cerebral arteries, the superior cerebellar arteries and faintly the anterior-inferior cerebellar arteries bilaterally. Significantly improved caliber was also noted at the origin of the right vertebral artery. A 120 cm Phenom Plus catheter was then advanced over the exchange micro guidewire under fluoroscopic guidance and positioned in the distal right vertebral artery. The Benchmark catheter was now advanced without difficulty into the distal right vertebral artery also. A roadmap was then obtained. Using a torque device, the exchange micro guidewire was safely advanced under fluoroscopic guidance into the proximal basilar artery and then the distal basilar artery into the right posterior cerebral artery P1 segment. Measurements were then performed of the basilar artery distal and proximal to the prominent stenosis. It was decided to proceed with placement of a Resolute Onyx 3 mm x 15 mm balloon mounted stent intracranially. However, the stented stent delivery apparatus could not be advanced through the 045 Phenom Plus catheter. The Phenom Plus catheter was removed with the wire maintained in the right P1 segment. The Benchmark guide catheter was easily then advanced into the right vertebrobasilar junction just proximal to the basilar artery. The Resolute Onyx which had been prepped with heparinized saline infusion retrogradely and angiographically with 60% contrast and 40% heparinized saline infusion was reintroduced and advanced without difficulty over the exchange wire to the distal end of the Benchmark guide catheter. This stent apparatus was easily advanced and positioned with the proximal and the distal markers adequate distant from the site of the significant mid basilar artery stenosis. The stent was then deployed by inflating balloon using a micro inflation syringe device via micro tubing. Balloon was expanded to approximately 2.9 mm.  The balloon was then deflated and retrieved. A control arteriogram performed through the Benchmark guide catheter demonstrated excellent apposition and flow through the stented mid basilar artery. Patency was seen of the posterior cerebral arteries, the superior cerebellar arteries and faintly the anterior-inferior cerebellar arteries. The Benchmark catheter was then retrieved more proximally while the exchange wire was retrieved into the V4 segment of the right vertebral artery. The Benchmark catheter was retrieved to just proximal to the origin of the right vertebral artery. Measurements were again performed of the vertebral artery at this site. It was decided to proceed with placement of a 3.5 mm x 18 mm Onyx Frontier balloon mountable stent. After having been prepped in the usual manner, this was then advanced and positioned such that the proximal portion of the device was just proximal to the origin of the right vertebral artery. Thereafter, controlled inflation was performed of the balloon mountable stent to approximately 8 atmospheres. This was maintained for approximately 10 seconds. The balloon was then deflated and retrieved and removed. A control arteriogram performed through the Benchmark guide catheter demonstrated excellent apposition and coverage of the previously occluded right vertebral artery origin and proximal portion. Free flow was now noted  through the proximal stent into the intracranial portion with patency maintained of the positioned mid basilar artery stent. Control arteriograms were then performed at 15 and 25 minutes post deployment of the devices. These continued to demonstrate excellent apposition with flow extra cranially and intracranially. The patient at this time was also given approximately 4 mg of intra-arterial Integrilin in order to prevent formation of platelet aggregation at the stented site. None was obvious on diagnostic catheter arteriograms. The exchange guidewire was  then retrieved and removed. A final control arteriogram performed through the Benchmark catheter and just proximal to the right vertebral artery origin continued to demonstrate excellent flow through the proximal stent and extra cranially and intracranially. There continued to be excellent flow through the basilar artery into the posterior cerebral arteries, the superior cerebellar arteries with faint opacification of both anterior-inferior cerebellar arteries. The Benchmark guide sheath was removed. The radial sheath was then removed with successful hemostasis with a wrist band at the right radial puncture site. Distal right radial pulse was verified to be present. A CT of the brain performed on the table demonstrated no evidence of hemorrhage or hydrocephalus. The patient was then extubated. Upon recovery, the patient was able to converse appropriately. He denied any nausea, vomiting or headaches. He was able to move all 4 extremities spontaneously and to command. His pupils were 2 mm equal and sluggish. No change was seen in the right facial droop. The tongue remained in the midline. The patient was then returned to his room in the ICU in stable condition to continue on low-dose IV heparin and 81 aspirin daily, and Brilinta 90 mg b.i.d. Early in the evening, the patient was more awake, alert and appropriately responsive. He continued to have dysarthria with right facial droop. Neurological examination continued to have non sustained nystagmus on right lateral gaze and to a lesser degree left lateral gaze with oscillopsia on the right lateral gaze unchanged. 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 PLAN: Follow-up in clinic 2 weeks post discharge. Electronically Signed   By: Luanne Bras M.D.   On: 09/02/2021 05:39   IR ANGIO VERTEBRAL SEL SUBCLAVIAN INNOMINATE UNI R MOD SED  Result Date: 09/02/2021 CLINICAL DATA:  History of progressive onset of dysarthria, nausea, vomiting and left-sided numbness. Patient also developed diplopia. 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 EXAM: INTRACRANIAL STENT (INCL PTA) COMPARISON:  CT angiogram of the head and neck of August 29, 2021, and MRI of the brain of August 29, 2021. MEDICATIONS: Heparin 4,000 units IV. Ancef 2 g IV antibiotic was administered within 1 hour of the procedure. ANESTHESIA/SEDATION: Mac anesthesia as per Department of Anesthesiology for diagnostic portion followed by general anesthesia for endovascular treatment portion. CONTRAST:  Omnipaque 300 approximately 150 mL. FLUOROSCOPY TIME:  Fluoroscopy Time: 41 minutes 18 seconds (2310 mGy). COMPLICATIONS: None immediate. TECHNIQUE: Informed written consent was obtained from the patient after a thorough discussion of the procedural risks, benefits and alternatives. All questions were addressed. Maximal Sterile Barrier Technique was utilized including caps, mask, sterile gowns, sterile gloves, sterile drape, hand hygiene and skin antiseptic. A timeout was performed prior to the initiation of the procedure. The right forearm to the wrist  was prepped and draped in the usual sterile manner. The right radial artery was then identified with ultrasound and its morphology documented. A dorsal palmar anastomosis was verified to be present. Using ultrasound guidance, and a micropuncture set access into the right radial artery was obtained with a 6/7 French radial sheath. The obturator, and the guidewire were removed. Good aspiration was obtained from the side port of the sheath. Cocktail of 2000 units of heparin,  2.5 mg of verapamil, and 200 mcg of nitroglycerin was then infused through the sheath in diluted form without event. A right radial arteriogram was performed. Over a 0.035 inch Roadrunner guidewire, a 5 [place] Simmons 2 diagnostic catheter was advanced to the aortic arch region, and arteriograms were performed of the right common carotid artery, the left common carotid artery, the left vertebral artery, and the right subclavian artery at the origin of stump of the occluded right vertebral artery. FINDINGS: The diagnostic study was somewhat marred by persistent motion artifact. The left common carotid arteriogram demonstrates the left external carotid artery and its major branches to be widely patent. The left internal carotid artery at the bulb demonstrates approximately 50% stenosis without evidence of intraluminal filling defects. More distally, the left internal carotid artery is seen to opacify to the cranial skull base. 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 A left posterior communicating artery is seen opacifying the left posterior cerebral artery distribution. The left middle cerebral artery and the left anterior cerebral artery opacify into the capillary and venous phases. Non dominant left vertebral artery demonstrates patency at its origin with brisk ascent to the cranial skull base to supply primarily the ipsilateral left posteroinferior cerebellar artery. The right common carotid arteriogram demonstrates the right external carotid artery and its major branches to be widely patent. The right internal carotid artery at the bulb has approximately 90% stenosis due to a circumferential smooth plaque. More distally, the vessel is seen to opacify to the cranial skull base. The petrous, the cavernous and the supraclinoid segments are widely patent. A right posterior communicating artery is seen  opacifying the right posterior cerebral artery distribution. The right middle and the right anterior cerebral arteries opacify into the capillary and venous phases. The right subclavian arteriogram at the origin of the occluded right vertebral artery demonstrates partial reconstitution of the right vertebral artery at the level of C3-C4 from collaterals arising from the cervical branch of the thyrocervical trunk with opacification noted more distally into the dominant right vertebrobasilar junction. Opacification is noted into the basilar artery, and retrogradely in the right vertebral artery to just above the site of the occlusion in the proximal right vertebral artery. ENDOVASCULAR REVASCULARIZATION OF OCCLUDED DOMINANT PROXIMAL RIGHT VERTEBRAL ARTERY WITH STENT ASSISTED ANGIOPLASTY, AND OF THE MID BASILAR ARTERY WITH STENT ASSISTED ANGIOPLASTY Over the exchange 035 inch 300 cm Rosen exchange guidewire, the combination of a 95 cm Benchmark guide catheter with a support 5.5 [place] Berenstein catheter was advanced and positioned just proximal to the stump of the occluded dominant right vertebral artery. The guidewire and the support catheter were gently retrieved and removed. Good aspiration was obtained from the hub of the Benchmark guide catheter. Bilateral roadmap was then obtained centered over the vertebral artery origin and distally through the [facility] guide catheter. Over an 0.014 inch standard Synchro micro guidewire with a moderate J configuration, inside of an 021 160 cm Trevo Trak microcatheter combination was  advanced to just proximal to the occluded dominant right vertebral artery. Using a torque device with gentle manipulation access was obtained with the micro guidewire through the occluded right vertebral artery without difficulty. The guidewire was advanced to the vertebral artery followed by the microcatheter. The guidewire was removed. Good aspiration obtained from the hub of microcatheter. A  control arteriogram was then performed through the microcatheter distally with opacification noted in the right vertebrobasilar junction, the basilar artery, the right posterior cerebral artery, the superior cerebellar arteries and the anterior-inferior cerebellar arteries. Also noted was significant narrowing secondary to an eccentric arteriosclerotic plaque in the mid basilar artery just proximal to the origin of right anterior inferior cerebellar artery. The microcatheter was then exchanged under constant fluoroscopic guidance for an 014 inch 300 cm Synchro standard micro guidewire with a moderate J configuration at its tip. Measurements were then performed of the most proximal portion of the right vertebral artery from the previous diagnostic arteriogram. It was decided to proceed with initial balloon angioplasty of the origin of the occluded right vertebral artery with a 2.5 mm x 15 mm Apex Monorail angioplasty balloon. This was prepped and purged antegradely with heparinized saline infusion, and retrogradely with 75% contrast and 25% heparinized saline infusion. Using rapid exchange technique, this was then advanced and positioned at the origin of the occluded right vertebral artery with the proximal marker proximal to the origin of the right vertebral artery. Control inflation was then performed using micro inflation syringe device via micro tubing to approximately 8 atmospheres where it was maintained for approximately 15 seconds. The balloon was deflated and retrieved and removed. Control arteriogram performed through [facility] guide catheter demonstrated significantly improved caliber and flow through the angioplastied segment with copious flow noted intracranially. 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 Patency was noted of the posterior cerebral arteries, the superior cerebellar arteries and faintly the anterior-inferior  cerebellar arteries bilaterally. Significantly improved caliber was also noted at the origin of the right vertebral artery. A 120 cm Phenom Plus catheter was then advanced over the exchange micro guidewire under fluoroscopic guidance and positioned in the distal right vertebral artery. The Benchmark catheter was now advanced without difficulty into the distal right vertebral artery also. A roadmap was then obtained. Using a torque device, the exchange micro guidewire was safely advanced under fluoroscopic guidance into the proximal basilar artery and then the distal basilar artery into the right posterior cerebral artery P1 segment. Measurements were then performed of the basilar artery distal and proximal to the prominent stenosis. It was decided to proceed with placement of a Resolute Onyx 3 mm x 15 mm balloon mounted stent intracranially. However, the stented stent delivery apparatus could not be advanced through the 045 Phenom Plus catheter. The Phenom Plus catheter was removed with the wire maintained in the right P1 segment. The Benchmark guide catheter was easily then advanced into the right vertebrobasilar junction just proximal to the basilar artery. The Resolute Onyx which had been prepped with heparinized saline infusion retrogradely and angiographically with 60% contrast and 40% heparinized saline infusion was reintroduced and advanced without difficulty over the exchange wire to the distal end of the Benchmark guide catheter. This stent apparatus was easily advanced and positioned with the proximal and the distal markers adequate distant from the site of the significant mid basilar artery stenosis. The stent was then deployed by inflating balloon using a micro inflation syringe device via micro tubing. Balloon  was expanded to approximately 2.9 mm. The balloon was then deflated and retrieved. A control arteriogram performed through the Benchmark guide catheter demonstrated excellent apposition and flow  through the stented mid basilar artery. Patency was seen of the posterior cerebral arteries, the superior cerebellar arteries and faintly the anterior-inferior cerebellar arteries. The Benchmark catheter was then retrieved more proximally while the exchange wire was retrieved into the V4 segment of the right vertebral artery. The Benchmark catheter was retrieved to just proximal to the origin of the right vertebral artery. Measurements were again performed of the vertebral artery at this site. It was decided to proceed with placement of a 3.5 mm x 18 mm Onyx Frontier balloon mountable stent. After having been prepped in the usual manner, this was then advanced and positioned such that the proximal portion of the device was just proximal to the origin of the right vertebral artery. Thereafter, controlled inflation was performed of the balloon mountable stent to approximately 8 atmospheres. This was maintained for approximately 10 seconds. The balloon was then deflated and retrieved and removed. A control arteriogram performed through the Benchmark guide catheter demonstrated excellent apposition and coverage of the previously occluded right vertebral artery origin and proximal portion. Free flow was now noted through the proximal stent into the intracranial portion with patency maintained of the positioned mid basilar artery stent. Control arteriograms were then performed at 15 and 25 minutes post deployment of the devices. These continued to demonstrate excellent apposition with flow extra cranially and intracranially. The patient at this time was also given approximately 4 mg of intra-arterial Integrilin in order to prevent formation of platelet aggregation at the stented site. None was obvious on diagnostic catheter arteriograms. The exchange guidewire was then retrieved and removed. A final control arteriogram performed through the Benchmark catheter and just proximal to the right vertebral artery origin  continued to demonstrate excellent flow through the proximal stent and extra cranially and intracranially. There continued to be excellent flow through the basilar artery into the posterior cerebral arteries, the superior cerebellar arteries with faint opacification of both anterior-inferior cerebellar arteries. The Benchmark guide sheath was removed. The radial sheath was then removed with successful hemostasis with a wrist band at the right radial puncture site. Distal right radial pulse was verified to be present. A CT of the brain performed on the table demonstrated no evidence of hemorrhage or hydrocephalus. The patient was then extubated. Upon recovery, the patient was able to converse appropriately. He denied any nausea, vomiting or headaches. He was able to move all 4 extremities spontaneously and to command. His pupils were 2 mm equal and sluggish. No change was seen in the right facial droop. The tongue remained in the midline. The patient was then returned to his room in the ICU in stable condition to continue on low-dose IV heparin and 81 aspirin daily, and Brilinta 90 mg b.i.d. Early in the evening, the patient was more awake, alert and appropriately responsive. He continued to have dysarthria with right facial droop. Neurological examination continued to have non sustained nystagmus on right lateral gaze and to a lesser degree left lateral gaze with oscillopsia on the right lateral gaze unchanged. 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 PLAN: Follow-up in clinic 2 weeks post discharge. Electronically Signed   By: Corky Downs.D.  On: 09/02/2021 05:39     PHYSICAL EXAM  Temp:  [98.1 F (36.7 C)-98.9 F (37.2 C)] 98.2 F (36.8 C) (11/06 0800) Pulse Rate:  [58-91]  69 (11/06 0900) Resp:  [10-20] 12 (11/06 0900) BP: (146-183)/(88-112) 181/112 (11/06 0900) SpO2:  [92 %-98 %] 97 % (11/06 0900)  General - Well nourished, well developed, alert. not in acute distress.  Cardiovascular - Regular rhythm and rate.   NEURO:  Mental Status: AA&Ox3. Can hold a conversation. Speech/Language: speech is without dysarthria or aphasia.  Naming, repetition, fluency, and comprehension intact.  Cranial Nerves:  II: PERRL. Diplopia persists esp on right lateral gaze. There is nystagmus noted on right lateral gaze. III, IV, VI: EOMI. Eyelids elevate symmetrically.  V: Patient c/o numbness to face but can perceive light touch  VII: Right facial droop present VIII: hearing intact to voice on left, right sided hearing loss persists to finger rub. IX, X: Phonation is normal.  XII: tongue is deviated to the left without fasciculations. Speech is dysarthric but intelligible.  Motor: 5/5 strength to all muscle groups tested.  Tone: is normal and bulk is normal Sensation- Intact to light touch on right, left sided numbness persists.  Coordination: FTN intact on left with right sided ataxia mild to moderate, HKS: no ataxia in LE.No drift.  Gait- deferred     ASSESSMENT/PLAN Maurice Little is a 66 y.o. male with no known medical history admitted for left-sided numbness, dizziness, headache diplopia.  Developed right facial droop, slurred speech and right hearing loss overnight.  No tPA given due to outside window. Stenting of basilar and vertebral arteries was performed. Repeat MRI post procedure demonstrates worsening and new infarcts. Urinary retention continues, will continue flomax and mobilize patient.  Patient will be discharged to CIR and will return in approximately 6 weeks for stenting of the right carotid artery.  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 Cardioembolic can not be completely ruled out but less likely  Resultant right peripheral facial droop (CN VII), right lateral gaze incomplete (CN VI), right hearing loss (CN VIII and AICA territory), left sided numbness (right spinal lemniscus and right trigeminothalamic tract), right cerebellum (right PICA and AICA) CT showed right cerebellar infarct CT head and neck right VA origin occlusion, reconstituted at distal V2.  Right PICA occlusion, mid basilar artery high-grade stenosis versus thrombosis, right ICA 65% stenosis, left VA ends at PICA MRI right cerebellar infarct, and right lower pontine infarct. IR right VA origin, mid to basilar artery prominent stenosis, status post stenting in both arteries.  Severe 90% stenosis right ICA proximal, 50% stenosis left ICA proximal. MRI repeat 11/5 demonstrates increased size of right cerebellar infarct, new patchy right cerebellar infarcts in AICA territors, new infarcts in right middle cerebellar peduncle, right medulla, right and left pons, new punctate infarcts in left parietal and occipital lobes. 2D Echo EF 60 to 65% TEE performed 11/4, LVEF 60-65%No LA/LAAthrombus or mass.+PFO with R-->L shuntingAtherosclerosis of the descending aorta. Recommend 30 day cardiac event monitoring as outpt to rule out afib LDL 220 HgbA1c 5.5 SCDs for DVT prophylaxis No antithrombotic prior to admission, now on aspirin 81 mg daily, Brilinta (ticagrelor) 90 mg bid, and now off heparin IV.  Patient counseled to be compliant with his antithrombotic medications Ongoing aggressive stroke  risk factor management Therapy recommendations: CIR Disposition: Pending  Carotid stenosis, bilateral CTA head and neck showed right ICA 65% stenosis Cerebral angiogram showed right ICA proximal 90% stenosis, left ICA proximal 50% stenosis Dr. Estanislado Pandy plan for right ICA stenting in the near future  likely in 6 weeks.  Hypertension Unstable BP goal relax to < 160 today On Cleviprex, taper off as able. On amlodipine, will consider additional PO antihypertensives. 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 With significant right side ataxia, diplopia, nystagmus and dysarthria. HTN is an issue, added hydralazine today prn.  He is having trouble voiding, in/out cath needed more frequently. On flomax, Foley placed later in the day, urine slightly pink. Need to monitor. IV fluids stopped.    Dr. Reeves Forth evaluated pt independently, reviewed imaging, chart, labs. Discussed and formulated plan with the APP. Please see APP note above for details.       This patient is critically ill due to respiratory distress, stroke s/p stenting and at significant risk of neurological worsening, death form heart failure, respiratory failure, recurrent stroke, bleeding from [facility], seizure, sepsis. This patient's care requires constant monitoring of vital signs, hemodynamics, respiratory and cardiac monitoring, review of multiple databases, neurological assessment, discussion with family, other specialists and medical decision making of high complexity. I spent 35 minutes of neurocritical care time in the care of this patient.   Kamyra Schroeck,MD   To contact Stroke Continuity provider, please refer to [email or web address] After hours, contact General Neurology

## 2021-09-02 NOTE — Progress Notes (Signed)
Physical Therapy Treatment Patient Details Name: Maurice Little MRN: [id number] DOB: [birth year] Today's Date: 09/02/2021   History of Present Illness 65 y/o male presented to ED on 11/2 for R facial droop, R sided weakness, headache, and dizziness. 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 Plan for TEE 11/4. PMH: HTN, HLD, PVD    PT Comments    Pt making progress with mobility. Continues to experience diplopia and dizziness with head turns, worked on gaze stabilization. Pt also experienced dizziness with sit>stand and was orthostatic. Able to stand within stedy from various seat heights with min A and work on standing tolerance, pregait, and maintaining midline. Wife present for session. Pt excellent candidate for CIR. PT will continue to follow.     Recommendations for follow up therapy are one component of a multi-disciplinary discharge planning process, led by the attending physician.  Recommendations may be updated based on patient status, additional functional criteria and insurance authorization.  Follow Up Recommendations  Acute inpatient rehab (3hours/day)     Assistance Recommended at Discharge Frequent or constant Supervision/Assistance  Equipment Recommendations  Other (comment) (TBD)    Recommendations for Other Services Rehab consult     Precautions / Restrictions Precautions Precautions: Fall Precaution Comments: diplopia, Ataxic R; L side numb Restrictions Weight Bearing Restrictions: No     Mobility  Bed Mobility Overal bed mobility: Needs Assistance Bed Mobility: Supine to Sit     Supine to sit: Mod assist     General bed mobility comments: cues for sequencing. modA to guide LEs to EOB and trunk elevation. Dizziness reported upon sitting EOB. Cues for gaze stabilization    Transfers Overall transfer level: Needs  assistance Equipment used: Ambulation equipment used Transfers: Sit to/from [employer] Sit to Stand: Min assist Stand pivot transfers: Total assist         General transfer comment: worked on maintaining midline and sit>stand from various heights with use of stedy. Pt able to stand even from low recliner with min A and increased time. Maintains R lean but able to correct with tactile cues Transfer via Lift Equipment: Stedy  Ambulation/Gait                 Stairs             Wheelchair Mobility    Modified Rankin (Stroke Patients Only) Modified Rankin (Stroke Patients Only) Pre-Morbid Rankin Score: No symptoms Modified Rankin: Severe disability     Balance Overall balance assessment: Needs assistance Sitting-balance support: Bilateral upper extremity supported;Feet supported Sitting balance-Leahy Scale: Poor Sitting balance - Comments: pt able to maintain static sitting with close supervision, loses balance to R when dynamic Postural control: Right lateral lean Standing balance support: Bilateral upper extremity supported;During functional activity Standing balance-Leahy Scale: Poor Standing balance comment: reliant on external assist to maintain standing balance                            Cognition Arousal/Alertness: Awake/alert Behavior During Therapy: Flat affect Overall Cognitive Status: Impaired/Different from baseline Area of Impairment: Attention;Following commands;Safety/judgement;Awareness;Problem solving                   Current Attention Level: Sustained   Following Commands: Follows one step commands with increased time;Follows one step commands consistently Safety/Judgement: Decreased awareness of safety;Decreased awareness of deficits Awareness:  Emergent Problem Solving: Slow processing;Difficulty sequencing;Requires verbal cues;Requires tactile cues General Comments: pt continues to have frequent  echolalia, especially when challenged with mobility. Follows 1 step commands consistently today        Exercises Other Exercises Other Exercises: gaze stabilization Other Exercises: increased touch to LUE with RUE    General Comments General comments (skin integrity, edema, etc.): BP in sitting 170/101, dropped to 126/76 in standing and pt with increased dizziness.      Pertinent Vitals/Pain Pain Assessment: No/denies pain    Home Living                          Prior Function            PT Goals (current goals can now be found in the care plan section) Acute Rehab PT Goals Patient Stated Goal: to get better PT Goal Formulation: With patient/family Time For Goal Achievement: 09/14/21 Potential to Achieve Goals: Good Progress towards PT goals: Progressing toward goals    Frequency    Min 4X/week      PT Plan Current plan remains appropriate    Co-evaluation              AM-PAC PT "6 Clicks" Mobility   Outcome Measure  Help needed turning from your back to your side while in a flat bed without using bedrails?: A Lot Help needed moving from lying on your back to sitting on the side of a flat bed without using bedrails?: A Lot Help needed moving to and from a bed to a chair (including a wheelchair)?: Total Help needed standing up from a chair using your arms (e.g., wheelchair or bedside chair)?: Total Help needed to walk in hospital room?: Total Help needed climbing 3-5 steps with a railing? : Total 6 Click Score: 8    End of Session Equipment Utilized During Treatment: Gait belt Activity Tolerance: Patient tolerated treatment well Patient left: in chair;with call bell/phone within reach;with chair alarm set;with family/visitor present Nurse Communication: Mobility status;Need for lift equipment PT Visit Diagnosis: Unsteadiness on feet (R26.81);Muscle weakness (generalized) (M62.81);Ataxic gait (R26.0);Other abnormalities of gait and mobility  (R26.89);Other symptoms and signs involving the nervous system (R29.898);Dizziness and giddiness (R42)     Time: 5409-8119 PT Time Calculation (min) (ACUTE ONLY): 40 min  Charges:  $Therapeutic Activity: 23-37 mins $Neuromuscular Re-education: 8-22 mins                     Lyanne Co, PT  Acute Rehab Services  Pager [phone] Office [phone]    Lawana Chambers Saphira Lahmann 09/02/2021, 1:35 PM

## 2021-09-03 DIAGNOSIS — I639 Cerebral infarction, unspecified: Secondary | ICD-10-CM | POA: Diagnosis not present

## 2021-09-03 LAB — CBC
HCT: 39.3 % (ref 39.0–52.0)
Hemoglobin: 12.8 g/dL — ABNORMAL LOW (ref 13.0–17.0)
MCH: 31.3 pg (ref 26.0–34.0)
MCHC: 32.6 g/dL (ref 30.0–36.0)
MCV: 96.1 fL (ref 80.0–100.0)
Platelets: 267 10*3/uL (ref 150–400)
RBC: 4.09 MIL/uL — ABNORMAL LOW (ref 4.22–5.81)
RDW: 14.3 % (ref 11.5–15.5)
WBC: 13.6 10*3/uL — ABNORMAL HIGH (ref 4.0–10.5)
nRBC: 0 % (ref 0.0–0.2)

## 2021-09-03 LAB — BASIC METABOLIC PANEL
Anion gap: 7 (ref 5–15)
BUN: 15 mg/dL (ref 8–23)
CO2: 26 mmol/L (ref 22–32)
Calcium: 8.7 mg/dL — ABNORMAL LOW (ref 8.9–10.3)
Chloride: 102 mmol/L (ref 98–111)
Creatinine, Ser: 0.76 mg/dL (ref 0.61–1.24)
GFR, Estimated: >60 mL/min (ref >60)
Glucose, Bld: 103 mg/dL — ABNORMAL HIGH (ref 70–99)
Potassium: 3.1 mmol/L — ABNORMAL LOW (ref 3.5–5.1)
Sodium: 135 mmol/L (ref 135–145)

## 2021-09-03 LAB — GLUCOSE, CAPILLARY
Glucose-Capillary: 103 mg/dL — ABNORMAL HIGH (ref 70–99)
Glucose-Capillary: 103 mg/dL — ABNORMAL HIGH (ref 70–99)
Glucose-Capillary: 104 mg/dL — ABNORMAL HIGH (ref 70–99)
Glucose-Capillary: 104 mg/dL — ABNORMAL HIGH (ref 70–99)
Glucose-Capillary: 105 mg/dL — ABNORMAL HIGH (ref 70–99)
Glucose-Capillary: 110 mg/dL — ABNORMAL HIGH (ref 70–99)

## 2021-09-03 MED ORDER — POTASSIUM CHLORIDE CRYS ER 20 MEQ PO TBCR
40.0000 meq | EXTENDED_RELEASE_TABLET | Freq: Once | ORAL | Status: AC
  Administered 2021-09-03: 40 meq via ORAL
  Filled 2021-09-03: qty 2

## 2021-09-03 NOTE — Progress Notes (Signed)
Referring Physician(s): Stroke   Supervising Physician: Luanne Bras  Patient Status:  [facility] - In-pt  Chief Complaint:  Code Stroke  S/p stent assisted angioplasty of occluded dominant RT VA origin and midbasilar artery prominent stenosis with Dr. Estanislado Pandy on 08/30/21.   Subjective:  Pt laying in bed, NAD. Wife at the bedside, states that the patient is slowly getting better.  Pt reports persistent double vision and right hearing loss, but can hear "too well" from left ear which he had to keep TV off.  Wife states that he should be transferred to inpatient rehab soon.   Allergies: Duloxetine  Medications: Prior to Admission medications   Medication Sig Start Date End Date Taking? Authorizing Provider  Acetylcysteine (NAC PO) Take 1 tablet by mouth daily.   Yes [provider]  aspirin EC 81 MG tablet Take 81 mg by mouth daily. Swallow whole.   Yes [provider]  Cyanocobalamin (B-12 PO) Take 1 tablet by mouth daily.   Yes [provider]  ibuprofen (ADVIL) 200 MG tablet Take 400 mg by mouth every 6 (six) hours as needed for fever, headache or mild pain.   Yes [provider]  OVER THE COUNTER MEDICATION Take 1 tablet by mouth daily. Delta 3 gummie   Yes [provider]     Vital Signs: BP (!) 168/101   Pulse (!) 58   Temp 98.5 F (36.9 C) (Oral)   Resp 13   Ht 6' (1.829 m)   Wt 202 lb 13.2 oz (92 kg)   SpO2 95%   BMI 27.51 kg/m   Physical Exam Vitals reviewed.  Constitutional:      General: He is not in acute distress. HENT:     Head: Normocephalic and atraumatic.  Eyes:     Comments: Tape on left glass   Pulmonary:     Effort: Pulmonary effort is normal.  Musculoskeletal:     Cervical back: Normal range of motion and neck supple.  Skin:    General: Skin is warm and dry.     Coloration: Skin is not jaundiced or pale.  Neurological:     Mental Status: He is alert and oriented to person, place, and time.      Comments: Alert, awake, and oriented x 3 Speech and comprehension intact EOMs intact, diplopia. No facial asymmetry. No pronator drift.   Psychiatric:        Mood and Affect: Mood normal.        Behavior: Behavior normal.    Imaging: MR BRAIN WO CONTRAST  Result Date: 09/01/2021 CLINICAL DATA:  Stroke, follow-up. 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 EXAM: MRI HEAD WITHOUT CONTRAST TECHNIQUE: Multiplanar, multiecho pulse sequences of the brain and surrounding structures were obtained without intravenous contrast. COMPARISON:  Head MRI 08/29/2021 FINDINGS: A limited examination was performed at the request of the ordering provider consisting of axial and coronal diffusion weighted imaging and axial susceptibility weighted imaging. There is a large acute infarct in the right cerebellar hemisphere in the PICA territory which has increased in size from the prior MRI. Additional new patchy acute right cerebellar infarcts are present in the AICA territory, and there are also new and larger acute infarcts involving the right middle cerebellar peduncle, right medulla, and right greater than left pons. New punctate acute infarcts are noted in the medial aspects of the left parietal and left occipital lobes. There is a small amount  of petechial hemorrhage associated with the right PICA infarct. 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 Electronically Signed   By: Logan Bores M.D.   On: 09/01/2021 19:52   IR Intra Cran Stent  Result Date: 09/02/2021 CLINICAL DATA:  History of progressive onset of dysarthria, nausea, vomiting and left-sided numbness. Patient also developed diplopia. 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 EXAM: INTRACRANIAL STENT (INCL PTA) COMPARISON:  CT angiogram of the head and neck of August 29, 2021, and MRI of the brain of August 29, 2021. MEDICATIONS: Heparin 4,000 units IV. Ancef 2 g IV antibiotic was administered within 1 hour of the procedure. ANESTHESIA/SEDATION: Mac anesthesia as per Department of Anesthesiology for diagnostic portion followed by general anesthesia for endovascular treatment portion. CONTRAST:  Omnipaque 300 approximately 150 mL. FLUOROSCOPY TIME:  Fluoroscopy Time: 41 minutes 18 seconds (2310 mGy). COMPLICATIONS: None immediate. TECHNIQUE: Informed written consent was obtained from the patient after a thorough discussion of the procedural risks, benefits and alternatives. All questions were addressed. Maximal Sterile Barrier Technique was utilized including caps, mask, sterile gowns, sterile gloves, sterile drape, hand hygiene and skin antiseptic. A timeout was performed prior to the initiation of the procedure. The right forearm to the wrist was prepped and draped in the usual sterile manner. The right radial artery was then identified with ultrasound and its morphology documented. A dorsal palmar anastomosis was verified to be present. Using ultrasound guidance, and a micropuncture set access into the right radial artery was obtained with a 6/7 French radial sheath. The obturator, and the guidewire were removed. Good aspiration was obtained from the side port of the sheath. Cocktail of 2000 units of heparin, 2.5 mg of verapamil, and 200 mcg of nitroglycerin was then infused through the sheath in diluted form without event. A right radial arteriogram was performed. Over a 0.035 inch Roadrunner guidewire, a 5 [place] Simmons 2 diagnostic catheter was advanced to the aortic arch region, and arteriograms were performed of the right common carotid artery, the left common carotid artery, the left vertebral artery, and the right subclavian artery at the origin of stump of  the occluded right vertebral artery. FINDINGS: The diagnostic study was somewhat marred by persistent motion artifact. The left common carotid arteriogram demonstrates the left external carotid artery and its major branches to be widely patent. The left internal carotid artery at the bulb demonstrates approximately 50% stenosis without evidence of intraluminal filling defects. More distally, the left internal carotid artery is seen to opacify to the cranial skull base. 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 A left posterior communicating artery is seen opacifying the left posterior cerebral artery distribution. The left middle cerebral artery and the left anterior cerebral artery opacify into the capillary and venous phases. Non dominant left vertebral artery demonstrates patency at its origin with brisk ascent to the cranial skull base to supply primarily the ipsilateral left posteroinferior cerebellar artery. The right common carotid arteriogram demonstrates the right external carotid artery and its major branches to be widely patent. The right internal carotid artery at the bulb has approximately 90% stenosis due to a circumferential smooth plaque. More distally, the vessel is seen to opacify to the cranial skull base. The petrous, the cavernous and the supraclinoid segments  are widely patent. A right posterior communicating artery is seen opacifying the right posterior cerebral artery distribution. The right middle and the right anterior cerebral arteries opacify into the capillary and venous phases. The right subclavian arteriogram at the origin of the occluded right vertebral artery demonstrates partial reconstitution of the right vertebral artery at the level of C3-C4 from collaterals arising from the cervical branch of the thyrocervical trunk with opacification noted more distally into the  dominant right vertebrobasilar junction. Opacification is noted into the basilar artery, and retrogradely in the right vertebral artery to just above the site of the occlusion in the proximal right vertebral artery. ENDOVASCULAR REVASCULARIZATION OF OCCLUDED DOMINANT PROXIMAL RIGHT VERTEBRAL ARTERY WITH STENT ASSISTED ANGIOPLASTY, AND OF THE MID BASILAR ARTERY WITH STENT ASSISTED ANGIOPLASTY Over the exchange 035 inch 300 cm Rosen exchange guidewire, the combination of a 95 cm Benchmark guide catheter with a support 5.5 [place] Berenstein catheter was advanced and positioned just proximal to the stump of the occluded dominant right vertebral artery. The guidewire and the support catheter were gently retrieved and removed. Good aspiration was obtained from the hub of the Benchmark guide catheter. Bilateral roadmap was then obtained centered over the vertebral artery origin and distally through the [facility] guide catheter. Over an 0.014 inch standard Synchro micro guidewire with a moderate J configuration, inside of an 021 160 cm Trevo Trak microcatheter combination was advanced to just proximal to the occluded dominant right vertebral artery. Using a torque device with gentle manipulation access was obtained with the micro guidewire through the occluded right vertebral artery without difficulty. The guidewire was advanced to the vertebral artery followed by the microcatheter. The guidewire was removed. Good aspiration obtained from the hub of microcatheter. A control arteriogram was then performed through the microcatheter distally with opacification noted in the right vertebrobasilar junction, the basilar artery, the right posterior cerebral artery, the superior cerebellar arteries and the anterior-inferior cerebellar arteries. Also noted was significant narrowing secondary to an eccentric arteriosclerotic plaque in the mid basilar artery just proximal to the origin of right anterior inferior cerebellar artery. The  microcatheter was then exchanged under constant fluoroscopic guidance for an 014 inch 300 cm Synchro standard micro guidewire with a moderate J configuration at its tip. Measurements were then performed of the most proximal portion of the right vertebral artery from the previous diagnostic arteriogram. It was decided to proceed with initial balloon angioplasty of the origin of the occluded right vertebral artery with a 2.5 mm x 15 mm Apex Monorail angioplasty balloon. This was prepped and purged antegradely with heparinized saline infusion, and retrogradely with 75% contrast and 25% heparinized saline infusion. Using rapid exchange technique, this was then advanced and positioned at the origin of the occluded right vertebral artery with the proximal marker proximal to the origin of the right vertebral artery. Control inflation was then performed using micro inflation syringe device via micro tubing to approximately 8 atmospheres where it was maintained for approximately 15 seconds. The balloon was deflated and retrieved and removed. Control arteriogram performed through [facility] guide catheter demonstrated significantly improved caliber and flow through the angioplastied segment with copious flow noted intracranially. 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 Patency was noted of the posterior cerebral arteries, the superior cerebellar arteries and faintly the anterior-inferior cerebellar arteries bilaterally. Significantly improved caliber was also noted at the origin of the right vertebral artery. A 120 cm Phenom Plus catheter was then advanced over  the exchange micro guidewire under fluoroscopic guidance and positioned in the distal right vertebral artery. The Benchmark catheter was now advanced without difficulty into the distal right vertebral artery also. A roadmap was then obtained. Using a torque device, the exchange micro guidewire  was safely advanced under fluoroscopic guidance into the proximal basilar artery and then the distal basilar artery into the right posterior cerebral artery P1 segment. Measurements were then performed of the basilar artery distal and proximal to the prominent stenosis. It was decided to proceed with placement of a Resolute Onyx 3 mm x 15 mm balloon mounted stent intracranially. However, the stented stent delivery apparatus could not be advanced through the 045 Phenom Plus catheter. The Phenom Plus catheter was removed with the wire maintained in the right P1 segment. The Benchmark guide catheter was easily then advanced into the right vertebrobasilar junction just proximal to the basilar artery. The Resolute Onyx which had been prepped with heparinized saline infusion retrogradely and angiographically with 60% contrast and 40% heparinized saline infusion was reintroduced and advanced without difficulty over the exchange wire to the distal end of the Benchmark guide catheter. This stent apparatus was easily advanced and positioned with the proximal and the distal markers adequate distant from the site of the significant mid basilar artery stenosis. The stent was then deployed by inflating balloon using a micro inflation syringe device via micro tubing. Balloon was expanded to approximately 2.9 mm. The balloon was then deflated and retrieved. A control arteriogram performed through the Benchmark guide catheter demonstrated excellent apposition and flow through the stented mid basilar artery. Patency was seen of the posterior cerebral arteries, the superior cerebellar arteries and faintly the anterior-inferior cerebellar arteries. The Benchmark catheter was then retrieved more proximally while the exchange wire was retrieved into the V4 segment of the right vertebral artery. The Benchmark catheter was retrieved to just proximal to the origin of the right vertebral artery. Measurements were again performed of the  vertebral artery at this site. It was decided to proceed with placement of a 3.5 mm x 18 mm Onyx Frontier balloon mountable stent. After having been prepped in the usual manner, this was then advanced and positioned such that the proximal portion of the device was just proximal to the origin of the right vertebral artery. Thereafter, controlled inflation was performed of the balloon mountable stent to approximately 8 atmospheres. This was maintained for approximately 10 seconds. The balloon was then deflated and retrieved and removed. A control arteriogram performed through the Benchmark guide catheter demonstrated excellent apposition and coverage of the previously occluded right vertebral artery origin and proximal portion. Free flow was now noted through the proximal stent into the intracranial portion with patency maintained of the positioned mid basilar artery stent. Control arteriograms were then performed at 15 and 25 minutes post deployment of the devices. These continued to demonstrate excellent apposition with flow extra cranially and intracranially. The patient at this time was also given approximately 4 mg of intra-arterial Integrilin in order to prevent formation of platelet aggregation at the stented site. None was obvious on diagnostic catheter arteriograms. The exchange guidewire was then retrieved and removed. A final control arteriogram performed through the Benchmark catheter and just proximal to the right vertebral artery origin continued to demonstrate excellent flow through the proximal stent and extra cranially and intracranially. There continued to be excellent flow through the basilar artery into the posterior cerebral arteries, the superior cerebellar arteries with faint opacification of both anterior-inferior cerebellar arteries. The  Benchmark guide sheath was removed. The radial sheath was then removed with successful hemostasis with a wrist band at the right radial puncture site. Distal  right radial pulse was verified to be present. A CT of the brain performed on the table demonstrated no evidence of hemorrhage or hydrocephalus. The patient was then extubated. Upon recovery, the patient was able to converse appropriately. He denied any nausea, vomiting or headaches. He was able to move all 4 extremities spontaneously and to command. His pupils were 2 mm equal and sluggish. No change was seen in the right facial droop. The tongue remained in the midline. The patient was then returned to his room in the ICU in stable condition to continue on low-dose IV heparin and 81 aspirin daily, and Brilinta 90 mg b.i.d. Early in the evening, the patient was more awake, alert and appropriately responsive. He continued to have dysarthria with right facial droop. Neurological examination continued to have non sustained nystagmus on right lateral gaze and to a lesser degree left lateral gaze with oscillopsia on the right lateral gaze unchanged. 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 PLAN: Follow-up in clinic 2 weeks post discharge. Electronically Signed   By: Luanne Bras M.D.   On: 09/02/2021 05:39   IR CT Head Ltd  Result Date: 09/02/2021 CLINICAL DATA:  History of progressive onset of dysarthria, nausea, vomiting and left-sided numbness. Patient also developed diplopia. 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 EXAM: INTRACRANIAL STENT (INCL PTA) COMPARISON:  CT angiogram of the head and neck of August 29, 2021, and MRI of the brain of August 29, 2021. MEDICATIONS: Heparin 4,000 units IV. Ancef 2 g IV  antibiotic was administered within 1 hour of the procedure. ANESTHESIA/SEDATION: Mac anesthesia as per Department of Anesthesiology for diagnostic portion followed by general anesthesia for endovascular treatment portion. CONTRAST:  Omnipaque 300 approximately 150 mL. FLUOROSCOPY TIME:  Fluoroscopy Time: 41 minutes 18 seconds (2310 mGy). COMPLICATIONS: None immediate. TECHNIQUE: Informed written consent was obtained from the patient after a thorough discussion of the procedural risks, benefits and alternatives. All questions were addressed. Maximal Sterile Barrier Technique was utilized including caps, mask, sterile gowns, sterile gloves, sterile drape, hand hygiene and skin antiseptic. A timeout was performed prior to the initiation of the procedure. The right forearm to the wrist was prepped and draped in the usual sterile manner. The right radial artery was then identified with ultrasound and its morphology documented. A dorsal palmar anastomosis was verified to be present. Using ultrasound guidance, and a micropuncture set access into the right radial artery was obtained with a 6/7 French radial sheath. The obturator, and the guidewire were removed. Good aspiration was obtained from the side port of the sheath. Cocktail of 2000 units of heparin, 2.5 mg of verapamil, and 200 mcg of nitroglycerin was then infused through the sheath in diluted form without event. A right radial arteriogram was performed. Over a 0.035 inch Roadrunner guidewire, a 5 [place] Simmons 2 diagnostic catheter was advanced to the aortic arch region, and arteriograms were performed of the right common carotid artery, the left common carotid artery, the left vertebral artery, and the right subclavian artery at the origin of stump of  the occluded right vertebral artery. FINDINGS: The diagnostic study was somewhat marred by persistent motion artifact. The left common carotid arteriogram demonstrates the left external carotid artery and its major  branches to be widely patent. The left internal carotid artery at the bulb demonstrates approximately 50% stenosis without evidence of intraluminal filling defects. More distally, the left internal carotid artery is seen to opacify to the cranial skull base. 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 A left posterior communicating artery is seen opacifying the left posterior cerebral artery distribution. The left middle cerebral artery and the left anterior cerebral artery opacify into the capillary and venous phases. Non dominant left vertebral artery demonstrates patency at its origin with brisk ascent to the cranial skull base to supply primarily the ipsilateral left posteroinferior cerebellar artery. The right common carotid arteriogram demonstrates the right external carotid artery and its major branches to be widely patent. The right internal carotid artery at the bulb has approximately 90% stenosis due to a circumferential smooth plaque. More distally, the vessel is seen to opacify to the cranial skull base. The petrous, the cavernous and the supraclinoid segments are widely patent. A right posterior communicating artery is seen opacifying the right posterior cerebral artery distribution. The right middle and the right anterior cerebral arteries opacify into the capillary and venous phases. The right subclavian arteriogram at the origin of the occluded right vertebral artery demonstrates partial reconstitution of the right vertebral artery at the level of C3-C4 from collaterals arising from the cervical branch of the thyrocervical trunk with opacification noted more distally into the dominant right vertebrobasilar junction. Opacification is noted into the basilar artery, and retrogradely in the right vertebral artery to just above the site of the occlusion in the proximal right vertebral artery.  ENDOVASCULAR REVASCULARIZATION OF OCCLUDED DOMINANT PROXIMAL RIGHT VERTEBRAL ARTERY WITH STENT ASSISTED ANGIOPLASTY, AND OF THE MID BASILAR ARTERY WITH STENT ASSISTED ANGIOPLASTY Over the exchange 035 inch 300 cm Rosen exchange guidewire, the combination of a 95 cm Benchmark guide catheter with a support 5.5 [place] Berenstein catheter was advanced and positioned just proximal to the stump of the occluded dominant right vertebral artery. The guidewire and the support catheter were gently retrieved and removed. Good aspiration was obtained from the hub of the Benchmark guide catheter. Bilateral roadmap was then obtained centered over the vertebral artery origin and distally through the [facility] guide catheter. Over an 0.014 inch standard Synchro micro guidewire with a moderate J configuration, inside of an 021 160 cm Trevo Trak microcatheter combination was advanced to just proximal to the occluded dominant right vertebral artery. Using a torque device with gentle manipulation access was obtained with the micro guidewire through the occluded right vertebral artery without difficulty. The guidewire was advanced to the vertebral artery followed by the microcatheter. The guidewire was removed. Good aspiration obtained from the hub of microcatheter. A control arteriogram was then performed through the microcatheter distally with opacification noted in the right vertebrobasilar junction, the basilar artery, the right posterior cerebral artery, the superior cerebellar arteries and the anterior-inferior cerebellar arteries. Also noted was significant narrowing secondary to an eccentric arteriosclerotic plaque in the mid basilar artery just proximal to the origin of right anterior inferior cerebellar artery. The microcatheter was then exchanged under constant fluoroscopic guidance for an 014 inch 300 cm Synchro standard micro guidewire with a moderate J configuration at its tip. Measurements were then performed of the  most  proximal portion of the right vertebral artery from the previous diagnostic arteriogram. It was decided to proceed with initial balloon angioplasty of the origin of the occluded right vertebral artery with a 2.5 mm x 15 mm Apex Monorail angioplasty balloon. This was prepped and purged antegradely with heparinized saline infusion, and retrogradely with 75% contrast and 25% heparinized saline infusion. Using rapid exchange technique, this was then advanced and positioned at the origin of the occluded right vertebral artery with the proximal marker proximal to the origin of the right vertebral artery. Control inflation was then performed using micro inflation syringe device via micro tubing to approximately 8 atmospheres where it was maintained for approximately 15 seconds. The balloon was deflated and retrieved and removed. Control arteriogram performed through [facility] guide catheter demonstrated significantly improved caliber and flow through the angioplastied segment with copious flow noted intracranially. 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 Patency was noted of the posterior cerebral arteries, the superior cerebellar arteries and faintly the anterior-inferior cerebellar arteries bilaterally. Significantly improved caliber was also noted at the origin of the right vertebral artery. A 120 cm Phenom Plus catheter was then advanced over the exchange micro guidewire under fluoroscopic guidance and positioned in the distal right vertebral artery. The Benchmark catheter was now advanced without difficulty into the distal right vertebral artery also. A roadmap was then obtained. Using a torque device, the exchange micro guidewire was safely advanced under fluoroscopic guidance into the proximal basilar artery and then the distal basilar artery into the right posterior cerebral artery P1 segment. Measurements were then performed of the basilar  artery distal and proximal to the prominent stenosis. It was decided to proceed with placement of a Resolute Onyx 3 mm x 15 mm balloon mounted stent intracranially. However, the stented stent delivery apparatus could not be advanced through the 045 Phenom Plus catheter. The Phenom Plus catheter was removed with the wire maintained in the right P1 segment. The Benchmark guide catheter was easily then advanced into the right vertebrobasilar junction just proximal to the basilar artery. The Resolute Onyx which had been prepped with heparinized saline infusion retrogradely and angiographically with 60% contrast and 40% heparinized saline infusion was reintroduced and advanced without difficulty over the exchange wire to the distal end of the Benchmark guide catheter. This stent apparatus was easily advanced and positioned with the proximal and the distal markers adequate distant from the site of the significant mid basilar artery stenosis. The stent was then deployed by inflating balloon using a micro inflation syringe device via micro tubing. Balloon was expanded to approximately 2.9 mm. The balloon was then deflated and retrieved. A control arteriogram performed through the Benchmark guide catheter demonstrated excellent apposition and flow through the stented mid basilar artery. Patency was seen of the posterior cerebral arteries, the superior cerebellar arteries and faintly the anterior-inferior cerebellar arteries. The Benchmark catheter was then retrieved more proximally while the exchange wire was retrieved into the V4 segment of the right vertebral artery. The Benchmark catheter was retrieved to just proximal to the origin of the right vertebral artery. Measurements were again performed of the vertebral artery at this site. It was decided to proceed with placement of a 3.5 mm x 18 mm Onyx Frontier balloon mountable stent. After having been prepped in the usual manner, this was then advanced and positioned such  that the proximal portion of the device was just proximal to the origin of the right  vertebral artery. Thereafter, controlled inflation was performed of the balloon mountable stent to approximately 8 atmospheres. This was maintained for approximately 10 seconds. The balloon was then deflated and retrieved and removed. A control arteriogram performed through the Benchmark guide catheter demonstrated excellent apposition and coverage of the previously occluded right vertebral artery origin and proximal portion. Free flow was now noted through the proximal stent into the intracranial portion with patency maintained of the positioned mid basilar artery stent. Control arteriograms were then performed at 15 and 25 minutes post deployment of the devices. These continued to demonstrate excellent apposition with flow extra cranially and intracranially. The patient at this time was also given approximately 4 mg of intra-arterial Integrilin in order to prevent formation of platelet aggregation at the stented site. None was obvious on diagnostic catheter arteriograms. The exchange guidewire was then retrieved and removed. A final control arteriogram performed through the Benchmark catheter and just proximal to the right vertebral artery origin continued to demonstrate excellent flow through the proximal stent and extra cranially and intracranially. There continued to be excellent flow through the basilar artery into the posterior cerebral arteries, the superior cerebellar arteries with faint opacification of both anterior-inferior cerebellar arteries. The Benchmark guide sheath was removed. The radial sheath was then removed with successful hemostasis with a wrist band at the right radial puncture site. Distal right radial pulse was verified to be present. A CT of the brain performed on the table demonstrated no evidence of hemorrhage or hydrocephalus. The patient was then extubated. Upon recovery, the patient was able to  converse appropriately. He denied any nausea, vomiting or headaches. He was able to move all 4 extremities spontaneously and to command. His pupils were 2 mm equal and sluggish. No change was seen in the right facial droop. The tongue remained in the midline. The patient was then returned to his room in the ICU in stable condition to continue on low-dose IV heparin and 81 aspirin daily, and Brilinta 90 mg b.i.d. Early in the evening, the patient was more awake, alert and appropriately responsive. He continued to have dysarthria with right facial droop. Neurological examination continued to have non sustained nystagmus on right lateral gaze and to a lesser degree left lateral gaze with oscillopsia on the right lateral gaze unchanged. 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 PLAN: Follow-up in clinic 2 weeks post discharge. Electronically Signed   By: Luanne Bras M.D.   On: 09/02/2021 05:39   IR US Guide Vasc Access Right  Result Date: 09/02/2021 CLINICAL DATA:  History of progressive onset of dysarthria, nausea, vomiting and left-sided numbness. Patient also developed diplopia. 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 EXAM: INTRACRANIAL STENT (INCL PTA) COMPARISON:  CT angiogram of the head and neck of August 29, 2021, and MRI of the brain of August 29, 2021. MEDICATIONS: Heparin 4,000 units IV. Ancef 2 g IV antibiotic was administered within 1 hour of the procedure. ANESTHESIA/SEDATION: Mac anesthesia as per Department of Anesthesiology for diagnostic portion followed by general anesthesia for endovascular  treatment portion. CONTRAST:  Omnipaque 300 approximately 150 mL. FLUOROSCOPY TIME:  Fluoroscopy Time: 41 minutes 18 seconds (2310 mGy). COMPLICATIONS: None immediate. TECHNIQUE: Informed written  consent was obtained from the patient after a thorough discussion of the procedural risks, benefits and alternatives. All questions were addressed. Maximal Sterile Barrier Technique was utilized including caps, mask, sterile gowns, sterile gloves, sterile drape, hand hygiene and skin antiseptic. A timeout was performed prior to the initiation of the procedure. The right forearm to the wrist was prepped and draped in the usual sterile manner. The right radial artery was then identified with ultrasound and its morphology documented. A dorsal palmar anastomosis was verified to be present. Using ultrasound guidance, and a micropuncture set access into the right radial artery was obtained with a 6/7 French radial sheath. The obturator, and the guidewire were removed. Good aspiration was obtained from the side port of the sheath. Cocktail of 2000 units of heparin, 2.5 mg of verapamil, and 200 mcg of nitroglycerin was then infused through the sheath in diluted form without event. A right radial arteriogram was performed. Over a 0.035 inch Roadrunner guidewire, a 5 [place] Simmons 2 diagnostic catheter was advanced to the aortic arch region, and arteriograms were performed of the right common carotid artery, the left common carotid artery, the left vertebral artery, and the right subclavian artery at the origin of stump of the occluded right vertebral artery. FINDINGS: The diagnostic study was somewhat marred by persistent motion artifact. The left common carotid arteriogram demonstrates the left external carotid artery and its major branches to be widely patent. The left internal carotid artery at the bulb demonstrates approximately 50% stenosis without evidence of intraluminal filling defects. More distally, the left internal  carotid artery is seen to opacify to the cranial skull base. 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 A left posterior communicating artery is seen opacifying the left posterior cerebral artery distribution. The left middle cerebral artery and the left anterior cerebral artery opacify into the capillary and venous phases. Non dominant left vertebral artery demonstrates patency at its origin with brisk ascent to the cranial skull base to supply primarily the ipsilateral left posteroinferior cerebellar artery. The right common carotid arteriogram demonstrates the right external carotid artery and its major branches to be widely patent. The right internal carotid artery at the bulb has approximately 90% stenosis due to a circumferential smooth plaque. More distally, the vessel is seen to opacify to the cranial skull base. The petrous, the cavernous and the supraclinoid segments are widely patent. A right posterior communicating artery is seen opacifying the right posterior cerebral artery distribution. The right middle and the right anterior cerebral arteries opacify into the capillary and venous phases. The right subclavian arteriogram at the origin of the occluded right vertebral artery demonstrates partial reconstitution of the right vertebral artery at the level of C3-C4 from collaterals arising from the cervical branch of the thyrocervical trunk with opacification noted more distally into the dominant right vertebrobasilar junction. Opacification is noted into the basilar artery, and retrogradely in the right vertebral artery to just above the site of the occlusion in the proximal right vertebral artery. ENDOVASCULAR REVASCULARIZATION OF OCCLUDED DOMINANT PROXIMAL RIGHT VERTEBRAL ARTERY WITH STENT ASSISTED ANGIOPLASTY, AND OF THE MID BASILAR ARTERY WITH STENT ASSISTED ANGIOPLASTY Over the exchange  035 inch 300 cm Rosen exchange guidewire, the combination of a 95 cm Benchmark guide catheter with a support 5.5 [place] Berenstein catheter was advanced and positioned just proximal to the stump of the occluded dominant right vertebral artery. The guidewire and the support catheter were  gently retrieved and removed. Good aspiration was obtained from the hub of the Benchmark guide catheter. Bilateral roadmap was then obtained centered over the vertebral artery origin and distally through the [facility] guide catheter. Over an 0.014 inch standard Synchro micro guidewire with a moderate J configuration, inside of an 021 160 cm Trevo Trak microcatheter combination was advanced to just proximal to the occluded dominant right vertebral artery. Using a torque device with gentle manipulation access was obtained with the micro guidewire through the occluded right vertebral artery without difficulty. The guidewire was advanced to the vertebral artery followed by the microcatheter. The guidewire was removed. Good aspiration obtained from the hub of microcatheter. A control arteriogram was then performed through the microcatheter distally with opacification noted in the right vertebrobasilar junction, the basilar artery, the right posterior cerebral artery, the superior cerebellar arteries and the anterior-inferior cerebellar arteries. Also noted was significant narrowing secondary to an eccentric arteriosclerotic plaque in the mid basilar artery just proximal to the origin of right anterior inferior cerebellar artery. The microcatheter was then exchanged under constant fluoroscopic guidance for an 014 inch 300 cm Synchro standard micro guidewire with a moderate J configuration at its tip. Measurements were then performed of the most proximal portion of the right vertebral artery from the previous diagnostic arteriogram. It was decided to proceed with initial balloon angioplasty of the origin of the occluded right vertebral  artery with a 2.5 mm x 15 mm Apex Monorail angioplasty balloon. This was prepped and purged antegradely with heparinized saline infusion, and retrogradely with 75% contrast and 25% heparinized saline infusion. Using rapid exchange technique, this was then advanced and positioned at the origin of the occluded right vertebral artery with the proximal marker proximal to the origin of the right vertebral artery. Control inflation was then performed using micro inflation syringe device via micro tubing to approximately 8 atmospheres where it was maintained for approximately 15 seconds. The balloon was deflated and retrieved and removed. Control arteriogram performed through [facility] guide catheter demonstrated significantly improved caliber and flow through the angioplastied segment with copious flow noted intracranially. 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 Patency was noted of the posterior cerebral arteries, the superior cerebellar arteries and faintly the anterior-inferior cerebellar arteries bilaterally. Significantly improved caliber was also noted at the origin of the right vertebral artery. A 120 cm Phenom Plus catheter was then advanced over the exchange micro guidewire under fluoroscopic guidance and positioned in the distal right vertebral artery. The Benchmark catheter was now advanced without difficulty into the distal right vertebral artery also. A roadmap was then obtained. Using a torque device, the exchange micro guidewire was safely advanced under fluoroscopic guidance into the proximal basilar artery and then the distal basilar artery into the right posterior cerebral artery P1 segment. Measurements were then performed of the basilar artery distal and proximal to the prominent stenosis. It was decided to proceed with placement of a Resolute Onyx 3 mm x 15 mm balloon mounted stent intracranially. However, the stented stent  delivery apparatus could not be advanced through the 045 Phenom Plus catheter. The Phenom Plus catheter was removed with the wire maintained in the right P1 segment. The Benchmark guide catheter was easily then advanced into the right vertebrobasilar junction just proximal to the basilar artery. The Resolute Onyx which had been prepped with heparinized saline infusion retrogradely and angiographically with 60% contrast and 40% heparinized saline infusion was reintroduced and advanced without difficulty  over the exchange wire to the distal end of the Benchmark guide catheter. This stent apparatus was easily advanced and positioned with the proximal and the distal markers adequate distant from the site of the significant mid basilar artery stenosis. The stent was then deployed by inflating balloon using a micro inflation syringe device via micro tubing. Balloon was expanded to approximately 2.9 mm. The balloon was then deflated and retrieved. A control arteriogram performed through the Benchmark guide catheter demonstrated excellent apposition and flow through the stented mid basilar artery. Patency was seen of the posterior cerebral arteries, the superior cerebellar arteries and faintly the anterior-inferior cerebellar arteries. The Benchmark catheter was then retrieved more proximally while the exchange wire was retrieved into the V4 segment of the right vertebral artery. The Benchmark catheter was retrieved to just proximal to the origin of the right vertebral artery. Measurements were again performed of the vertebral artery at this site. It was decided to proceed with placement of a 3.5 mm x 18 mm Onyx Frontier balloon mountable stent. After having been prepped in the usual manner, this was then advanced and positioned such that the proximal portion of the device was just proximal to the origin of the right vertebral artery. Thereafter, controlled inflation was performed of the balloon mountable stent to  approximately 8 atmospheres. This was maintained for approximately 10 seconds. The balloon was then deflated and retrieved and removed. A control arteriogram performed through the Benchmark guide catheter demonstrated excellent apposition and coverage of the previously occluded right vertebral artery origin and proximal portion. Free flow was now noted through the proximal stent into the intracranial portion with patency maintained of the positioned mid basilar artery stent. Control arteriograms were then performed at 15 and 25 minutes post deployment of the devices. These continued to demonstrate excellent apposition with flow extra cranially and intracranially. The patient at this time was also given approximately 4 mg of intra-arterial Integrilin in order to prevent formation of platelet aggregation at the stented site. None was obvious on diagnostic catheter arteriograms. The exchange guidewire was then retrieved and removed. A final control arteriogram performed through the Benchmark catheter and just proximal to the right vertebral artery origin continued to demonstrate excellent flow through the proximal stent and extra cranially and intracranially. There continued to be excellent flow through the basilar artery into the posterior cerebral arteries, the superior cerebellar arteries with faint opacification of both anterior-inferior cerebellar arteries. The Benchmark guide sheath was removed. The radial sheath was then removed with successful hemostasis with a wrist band at the right radial puncture site. Distal right radial pulse was verified to be present. A CT of the brain performed on the table demonstrated no evidence of hemorrhage or hydrocephalus. The patient was then extubated. Upon recovery, the patient was able to converse appropriately. He denied any nausea, vomiting or headaches. He was able to move all 4 extremities spontaneously and to command. His pupils were 2 mm equal and sluggish. No change  was seen in the right facial droop. The tongue remained in the midline. The patient was then returned to his room in the ICU in stable condition to continue on low-dose IV heparin and 81 aspirin daily, and Brilinta 90 mg b.i.d. Early in the evening, the patient was more awake, alert and appropriately responsive. He continued to have dysarthria with right facial droop. Neurological examination continued to have non sustained nystagmus on right lateral gaze and to a lesser degree left lateral gaze with oscillopsia on the right  lateral gaze unchanged. 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 PLAN: Follow-up in clinic 2 weeks post discharge. Electronically Signed   By: Luanne Bras M.D.   On: 09/02/2021 05:39   VAS [place] LOWER EXTREMITY VENOUS (DVT)  Result Date: 08/31/2021  Lower Venous DVT Study Patient Name:  Maurice Little  Date of Exam:   08/31/2021 Medical Rec #: [id number]   Accession #:    [id number] Date of Birth: 24-Aug-1956    Patient Gender: M Patient Age:   41 years Exam Location:  [facility] Procedure:      VAS [place] LOWER EXTREMITY VENOUS (DVT) Referring Phys: Cornelius Moras XU --------------------------------------------------------------------------------  Indications: Stroke.  Comparison Study: no prior Performing Technologist: Archie Patten RVS  Examination Guidelines: A complete evaluation includes B-mode imaging, spectral Doppler, color Doppler, and power Doppler as needed of all accessible portions of each vessel. Bilateral testing is considered an integral part of a complete examination. Limited examinations for reoccurring indications may be performed as noted. The reflux portion of the exam is performed with the patient in reverse Trendelenburg.   +---------+---------------+---------+-----------+----------+--------------+ RIGHT    CompressibilityPhasicitySpontaneityPropertiesThrombus Aging +---------+---------------+---------+-----------+----------+--------------+ CFV      Full           Yes      Yes                                 +---------+---------------+---------+-----------+----------+--------------+ SFJ      Full                                                        +---------+---------------+---------+-----------+----------+--------------+ FV Prox  Full                                                        +---------+---------------+---------+-----------+----------+--------------+ FV Mid   Full                                                        +---------+---------------+---------+-----------+----------+--------------+ FV DistalFull                                                        +---------+---------------+---------+-----------+----------+--------------+ PFV      Full                                                        +---------+---------------+---------+-----------+----------+--------------+ POP      Full  Yes      Yes                                 +---------+---------------+---------+-----------+----------+--------------+ PTV      Full                                                        +---------+---------------+---------+-----------+----------+--------------+ PERO     Full                                                        +---------+---------------+---------+-----------+----------+--------------+   +---------+---------------+---------+-----------+----------+--------------+ LEFT     CompressibilityPhasicitySpontaneityPropertiesThrombus Aging +---------+---------------+---------+-----------+----------+--------------+ CFV      Full           Yes      Yes                                  +---------+---------------+---------+-----------+----------+--------------+ SFJ      Full                                                        +---------+---------------+---------+-----------+----------+--------------+ FV Prox  Full                                                        +---------+---------------+---------+-----------+----------+--------------+ FV Mid   Full                                                        +---------+---------------+---------+-----------+----------+--------------+ FV DistalFull                                                        +---------+---------------+---------+-----------+----------+--------------+ PFV      Full                                                        +---------+---------------+---------+-----------+----------+--------------+ POP      Full           Yes      Yes                                 +---------+---------------+---------+-----------+----------+--------------+ PTV  Full                                                        +---------+---------------+---------+-----------+----------+--------------+ PERO     Full                                                        +---------+---------------+---------+-----------+----------+--------------+     Summary: BILATERAL: - No evidence of deep vein thrombosis seen in the lower extremities, bilaterally. - No evidence of superficial venous thrombosis in the lower extremities, bilaterally. -   *See table(s) above for measurements and observations. Electronically signed by Monica Martinez MD on 08/31/2021 at 12:34:17 PM.    Final    IR ANGIO VERTEBRAL SEL SUBCLAVIAN INNOMINATE UNI L MOD SED  Result Date: 09/02/2021 CLINICAL DATA:  History of progressive onset of dysarthria, nausea, vomiting and left-sided numbness. Patient also developed diplopia. 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 EXAM: INTRACRANIAL STENT (INCL PTA) COMPARISON:  CT angiogram of the head and neck of August 29, 2021, and MRI of the brain of August 29, 2021. MEDICATIONS: Heparin 4,000 units IV. Ancef 2 g IV antibiotic was administered within 1 hour of the procedure. ANESTHESIA/SEDATION: Mac anesthesia as per Department of Anesthesiology for diagnostic portion followed by general anesthesia for endovascular treatment portion. CONTRAST:  Omnipaque 300 approximately 150 mL. FLUOROSCOPY TIME:  Fluoroscopy Time: 41 minutes 18 seconds (2310 mGy). COMPLICATIONS: None immediate. TECHNIQUE: Informed written consent was obtained from the patient after a thorough discussion of the procedural risks, benefits and alternatives. All questions were addressed. Maximal Sterile Barrier Technique was utilized including caps, mask, sterile gowns, sterile gloves, sterile drape, hand hygiene and skin antiseptic. A timeout was performed prior to the initiation of the procedure. The right forearm to the wrist was prepped and draped in the usual sterile manner. The right radial artery was then identified with ultrasound and its morphology documented. A dorsal palmar anastomosis was verified to be present. Using ultrasound guidance, and a micropuncture set access into the right radial artery was obtained with a 6/7 French radial sheath. The obturator, and the guidewire were removed. Good aspiration was obtained from the side port of the sheath. Cocktail of 2000 units of heparin, 2.5 mg of verapamil, and 200 mcg of nitroglycerin was then infused through the sheath in diluted form without event. A right radial arteriogram was performed. Over a 0.035 inch Roadrunner guidewire, a 5 [place] Simmons 2 diagnostic catheter was advanced to the aortic arch region, and arteriograms were performed of the right common carotid artery, the left  common carotid artery, the left vertebral artery, and the right subclavian artery at the origin of stump of the occluded right vertebral artery. FINDINGS: The diagnostic study was somewhat marred by persistent motion artifact. The left common carotid arteriogram demonstrates the left external carotid artery and its major branches to be widely patent. The left internal carotid artery at the bulb demonstrates approximately 50% stenosis without evidence of intraluminal filling defects. More distally, the left internal carotid artery  is seen to opacify to the cranial skull base. 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 A left posterior communicating artery is seen opacifying the left posterior cerebral artery distribution. The left middle cerebral artery and the left anterior cerebral artery opacify into the capillary and venous phases. Non dominant left vertebral artery demonstrates patency at its origin with brisk ascent to the cranial skull base to supply primarily the ipsilateral left posteroinferior cerebellar artery. The right common carotid arteriogram demonstrates the right external carotid artery and its major branches to be widely patent. The right internal carotid artery at the bulb has approximately 90% stenosis due to a circumferential smooth plaque. More distally, the vessel is seen to opacify to the cranial skull base. The petrous, the cavernous and the supraclinoid segments are widely patent. A right posterior communicating artery is seen opacifying the right posterior cerebral artery distribution. The right middle and the right anterior cerebral arteries opacify into the capillary and venous phases. The right subclavian arteriogram at the origin of the occluded right vertebral artery demonstrates partial reconstitution of the right vertebral artery at the level of C3-C4 from collaterals  arising from the cervical branch of the thyrocervical trunk with opacification noted more distally into the dominant right vertebrobasilar junction. Opacification is noted into the basilar artery, and retrogradely in the right vertebral artery to just above the site of the occlusion in the proximal right vertebral artery. ENDOVASCULAR REVASCULARIZATION OF OCCLUDED DOMINANT PROXIMAL RIGHT VERTEBRAL ARTERY WITH STENT ASSISTED ANGIOPLASTY, AND OF THE MID BASILAR ARTERY WITH STENT ASSISTED ANGIOPLASTY Over the exchange 035 inch 300 cm Rosen exchange guidewire, the combination of a 95 cm Benchmark guide catheter with a support 5.5 [place] Berenstein catheter was advanced and positioned just proximal to the stump of the occluded dominant right vertebral artery. The guidewire and the support catheter were gently retrieved and removed. Good aspiration was obtained from the hub of the Benchmark guide catheter. Bilateral roadmap was then obtained centered over the vertebral artery origin and distally through the [facility] guide catheter. Over an 0.014 inch standard Synchro micro guidewire with a moderate J configuration, inside of an 021 160 cm Trevo Trak microcatheter combination was advanced to just proximal to the occluded dominant right vertebral artery. Using a torque device with gentle manipulation access was obtained with the micro guidewire through the occluded right vertebral artery without difficulty. The guidewire was advanced to the vertebral artery followed by the microcatheter. The guidewire was removed. Good aspiration obtained from the hub of microcatheter. A control arteriogram was then performed through the microcatheter distally with opacification noted in the right vertebrobasilar junction, the basilar artery, the right posterior cerebral artery, the superior cerebellar arteries and the anterior-inferior cerebellar arteries. Also noted was significant narrowing secondary to an eccentric arteriosclerotic  plaque in the mid basilar artery just proximal to the origin of right anterior inferior cerebellar artery. The microcatheter was then exchanged under constant fluoroscopic guidance for an 014 inch 300 cm Synchro standard micro guidewire with a moderate J configuration at its tip. Measurements were then performed of the most proximal portion of the right vertebral artery from the previous diagnostic arteriogram. It was decided to proceed with initial balloon angioplasty of the origin of the occluded right vertebral artery with a 2.5 mm x 15 mm Apex Monorail angioplasty balloon. This was prepped and purged antegradely with heparinized saline infusion, and retrogradely with 75% contrast and 25% heparinized  saline infusion. Using rapid exchange technique, this was then advanced and positioned at the origin of the occluded right vertebral artery with the proximal marker proximal to the origin of the right vertebral artery. Control inflation was then performed using micro inflation syringe device via micro tubing to approximately 8 atmospheres where it was maintained for approximately 15 seconds. The balloon was deflated and retrieved and removed. Control arteriogram performed through [facility] guide catheter demonstrated significantly improved caliber and flow through the angioplastied segment with copious flow noted intracranially. 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 Patency was noted of the posterior cerebral arteries, the superior cerebellar arteries and faintly the anterior-inferior cerebellar arteries bilaterally. Significantly improved caliber was also noted at the origin of the right vertebral artery. A 120 cm Phenom Plus catheter was then advanced over the exchange micro guidewire under fluoroscopic guidance and positioned in the distal right vertebral artery. The Benchmark catheter was now advanced without difficulty into the distal  right vertebral artery also. A roadmap was then obtained. Using a torque device, the exchange micro guidewire was safely advanced under fluoroscopic guidance into the proximal basilar artery and then the distal basilar artery into the right posterior cerebral artery P1 segment. Measurements were then performed of the basilar artery distal and proximal to the prominent stenosis. It was decided to proceed with placement of a Resolute Onyx 3 mm x 15 mm balloon mounted stent intracranially. However, the stented stent delivery apparatus could not be advanced through the 045 Phenom Plus catheter. The Phenom Plus catheter was removed with the wire maintained in the right P1 segment. The Benchmark guide catheter was easily then advanced into the right vertebrobasilar junction just proximal to the basilar artery. The Resolute Onyx which had been prepped with heparinized saline infusion retrogradely and angiographically with 60% contrast and 40% heparinized saline infusion was reintroduced and advanced without difficulty over the exchange wire to the distal end of the Benchmark guide catheter. This stent apparatus was easily advanced and positioned with the proximal and the distal markers adequate distant from the site of the significant mid basilar artery stenosis. The stent was then deployed by inflating balloon using a micro inflation syringe device via micro tubing. Balloon was expanded to approximately 2.9 mm. The balloon was then deflated and retrieved. A control arteriogram performed through the Benchmark guide catheter demonstrated excellent apposition and flow through the stented mid basilar artery. Patency was seen of the posterior cerebral arteries, the superior cerebellar arteries and faintly the anterior-inferior cerebellar arteries. The Benchmark catheter was then retrieved more proximally while the exchange wire was retrieved into the V4 segment of the right vertebral artery. The Benchmark catheter was  retrieved to just proximal to the origin of the right vertebral artery. Measurements were again performed of the vertebral artery at this site. It was decided to proceed with placement of a 3.5 mm x 18 mm Onyx Frontier balloon mountable stent. After having been prepped in the usual manner, this was then advanced and positioned such that the proximal portion of the device was just proximal to the origin of the right vertebral artery. Thereafter, controlled inflation was performed of the balloon mountable stent to approximately 8 atmospheres. This was maintained for approximately 10 seconds. The balloon was then deflated and retrieved and removed. A control arteriogram performed through the Benchmark guide catheter demonstrated excellent apposition and coverage of the previously occluded right vertebral artery origin and proximal portion. Free flow was now  noted through the proximal stent into the intracranial portion with patency maintained of the positioned mid basilar artery stent. Control arteriograms were then performed at 15 and 25 minutes post deployment of the devices. These continued to demonstrate excellent apposition with flow extra cranially and intracranially. The patient at this time was also given approximately 4 mg of intra-arterial Integrilin in order to prevent formation of platelet aggregation at the stented site. None was obvious on diagnostic catheter arteriograms. The exchange guidewire was then retrieved and removed. A final control arteriogram performed through the Benchmark catheter and just proximal to the right vertebral artery origin continued to demonstrate excellent flow through the proximal stent and extra cranially and intracranially. There continued to be excellent flow through the basilar artery into the posterior cerebral arteries, the superior cerebellar arteries with faint opacification of both anterior-inferior cerebellar arteries. The Benchmark guide sheath was removed. The  radial sheath was then removed with successful hemostasis with a wrist band at the right radial puncture site. Distal right radial pulse was verified to be present. A CT of the brain performed on the table demonstrated no evidence of hemorrhage or hydrocephalus. The patient was then extubated. Upon recovery, the patient was able to converse appropriately. He denied any nausea, vomiting or headaches. He was able to move all 4 extremities spontaneously and to command. His pupils were 2 mm equal and sluggish. No change was seen in the right facial droop. The tongue remained in the midline. The patient was then returned to his room in the ICU in stable condition to continue on low-dose IV heparin and 81 aspirin daily, and Brilinta 90 mg b.i.d. Early in the evening, the patient was more awake, alert and appropriately responsive. He continued to have dysarthria with right facial droop. Neurological examination continued to have non sustained nystagmus on right lateral gaze and to a lesser degree left lateral gaze with oscillopsia on the right lateral gaze unchanged. 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 PLAN: Follow-up in clinic 2 weeks post discharge. Electronically Signed   By: Luanne Bras M.D.   On: 09/02/2021 05:39   IR ANGIO VERTEBRAL SEL SUBCLAVIAN INNOMINATE UNI R MOD SED  Result Date: 09/02/2021 CLINICAL DATA:  History of progressive onset of dysarthria, nausea, vomiting and left-sided numbness. Patient also developed diplopia. 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 EXAM: INTRACRANIAL STENT (INCL PTA)  COMPARISON:  CT angiogram of the head and neck of August 29, 2021, and MRI of the brain of August 29, 2021. MEDICATIONS: Heparin 4,000 units IV. Ancef 2 g IV antibiotic was administered within 1 hour of the procedure. ANESTHESIA/SEDATION: Mac anesthesia as per Department of Anesthesiology for diagnostic portion followed by general anesthesia for endovascular treatment portion. CONTRAST:  Omnipaque 300 approximately 150 mL. FLUOROSCOPY TIME:  Fluoroscopy Time: 41 minutes 18 seconds (2310 mGy). COMPLICATIONS: None immediate. TECHNIQUE: Informed written consent was obtained from the patient after a thorough discussion of the procedural risks, benefits and alternatives. All questions were addressed. Maximal Sterile Barrier Technique was utilized including caps, mask, sterile gowns, sterile gloves, sterile drape, hand hygiene and skin antiseptic. A timeout was performed prior to the initiation of the procedure. The right forearm to the  wrist was prepped and draped in the usual sterile manner. The right radial artery was then identified with ultrasound and its morphology documented. A dorsal palmar anastomosis was verified to be present. Using ultrasound guidance, and a micropuncture set access into the right radial artery was obtained with a 6/7 French radial sheath. The obturator, and the guidewire were removed. Good aspiration was obtained from the side port of the sheath. Cocktail of 2000 units of heparin, 2.5 mg of verapamil, and 200 mcg of nitroglycerin was then infused through the sheath in diluted form without event. A right radial arteriogram was performed. Over a 0.035 inch Roadrunner guidewire, a 5 [place] Simmons 2 diagnostic catheter was advanced to the aortic arch region, and arteriograms were performed of the right common carotid artery, the left common carotid artery, the left vertebral artery, and the right subclavian artery at the origin of stump of the occluded right vertebral artery. FINDINGS: The  diagnostic study was somewhat marred by persistent motion artifact. The left common carotid arteriogram demonstrates the left external carotid artery and its major branches to be widely patent. The left internal carotid artery at the bulb demonstrates approximately 50% stenosis without evidence of intraluminal filling defects. More distally, the left internal carotid artery is seen to opacify to the cranial skull base. 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 A left posterior communicating artery is seen opacifying the left posterior cerebral artery distribution. The left middle cerebral artery and the left anterior cerebral artery opacify into the capillary and venous phases. Non dominant left vertebral artery demonstrates patency at its origin with brisk ascent to the cranial skull base to supply primarily the ipsilateral left posteroinferior cerebellar artery. The right common carotid arteriogram demonstrates the right external carotid artery and its major branches to be widely patent. The right internal carotid artery at the bulb has approximately 90% stenosis due to a circumferential smooth plaque. More distally, the vessel is seen to opacify to the cranial skull base. The petrous, the cavernous and the supraclinoid segments are widely patent. A right posterior communicating artery is seen opacifying the right posterior cerebral artery distribution. The right middle and the right anterior cerebral arteries opacify into the capillary and venous phases. The right subclavian arteriogram at the origin of the occluded right vertebral artery demonstrates partial reconstitution of the right vertebral artery at the level of C3-C4 from collaterals arising from the cervical branch of the thyrocervical trunk with opacification noted more distally into the dominant right vertebrobasilar junction.  Opacification is noted into the basilar artery, and retrogradely in the right vertebral artery to just above the site of the occlusion in the proximal right vertebral artery. ENDOVASCULAR REVASCULARIZATION OF OCCLUDED DOMINANT PROXIMAL RIGHT VERTEBRAL ARTERY WITH STENT ASSISTED ANGIOPLASTY, AND OF THE MID BASILAR ARTERY WITH STENT ASSISTED ANGIOPLASTY Over the exchange 035 inch 300 cm Rosen exchange guidewire, the combination of a 95 cm Benchmark guide catheter with a support 5.5 [place] Berenstein catheter was advanced and positioned just proximal to the stump of the occluded dominant right vertebral artery. The guidewire and the support catheter were gently retrieved and removed. Good aspiration was obtained from the hub of the Benchmark guide catheter. Bilateral roadmap was then obtained centered over the vertebral artery origin and distally through the [facility] guide catheter. Over an 0.014 inch standard Synchro micro guidewire with a moderate J configuration, inside of an 021 160 cm Trevo Trak microcatheter combination  was advanced to just proximal to the occluded dominant right vertebral artery. Using a torque device with gentle manipulation access was obtained with the micro guidewire through the occluded right vertebral artery without difficulty. The guidewire was advanced to the vertebral artery followed by the microcatheter. The guidewire was removed. Good aspiration obtained from the hub of microcatheter. A control arteriogram was then performed through the microcatheter distally with opacification noted in the right vertebrobasilar junction, the basilar artery, the right posterior cerebral artery, the superior cerebellar arteries and the anterior-inferior cerebellar arteries. Also noted was significant narrowing secondary to an eccentric arteriosclerotic plaque in the mid basilar artery just proximal to the origin of right anterior inferior cerebellar artery. The microcatheter was then exchanged under  constant fluoroscopic guidance for an 014 inch 300 cm Synchro standard micro guidewire with a moderate J configuration at its tip. Measurements were then performed of the most proximal portion of the right vertebral artery from the previous diagnostic arteriogram. It was decided to proceed with initial balloon angioplasty of the origin of the occluded right vertebral artery with a 2.5 mm x 15 mm Apex Monorail angioplasty balloon. This was prepped and purged antegradely with heparinized saline infusion, and retrogradely with 75% contrast and 25% heparinized saline infusion. Using rapid exchange technique, this was then advanced and positioned at the origin of the occluded right vertebral artery with the proximal marker proximal to the origin of the right vertebral artery. Control inflation was then performed using micro inflation syringe device via micro tubing to approximately 8 atmospheres where it was maintained for approximately 15 seconds. The balloon was deflated and retrieved and removed. Control arteriogram performed through [facility] guide catheter demonstrated significantly improved caliber and flow through the angioplastied segment with copious flow noted intracranially. 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 Patency was noted of the posterior cerebral arteries, the superior cerebellar arteries and faintly the anterior-inferior cerebellar arteries bilaterally. Significantly improved caliber was also noted at the origin of the right vertebral artery. A 120 cm Phenom Plus catheter was then advanced over the exchange micro guidewire under fluoroscopic guidance and positioned in the distal right vertebral artery. The Benchmark catheter was now advanced without difficulty into the distal right vertebral artery also. A roadmap was then obtained. Using a torque device, the exchange micro guidewire was safely advanced under fluoroscopic  guidance into the proximal basilar artery and then the distal basilar artery into the right posterior cerebral artery P1 segment. Measurements were then performed of the basilar artery distal and proximal to the prominent stenosis. It was decided to proceed with placement of a Resolute Onyx 3 mm x 15 mm balloon mounted stent intracranially. However, the stented stent delivery apparatus could not be advanced through the 045 Phenom Plus catheter. The Phenom Plus catheter was removed with the wire maintained in the right P1 segment. The Benchmark guide catheter was easily then advanced into the right vertebrobasilar junction just proximal to the basilar artery. The Resolute Onyx which had been prepped with heparinized saline infusion retrogradely and angiographically with 60% contrast and 40% heparinized saline infusion was reintroduced and advanced without difficulty over the exchange wire to the distal end of the Benchmark guide catheter. This stent apparatus was easily advanced and positioned with the proximal and the distal markers adequate distant from the site of the significant mid basilar artery stenosis. The stent was then deployed by inflating balloon using a micro inflation syringe device via micro tubing.  Balloon was expanded to approximately 2.9 mm. The balloon was then deflated and retrieved. A control arteriogram performed through the Benchmark guide catheter demonstrated excellent apposition and flow through the stented mid basilar artery. Patency was seen of the posterior cerebral arteries, the superior cerebellar arteries and faintly the anterior-inferior cerebellar arteries. The Benchmark catheter was then retrieved more proximally while the exchange wire was retrieved into the V4 segment of the right vertebral artery. The Benchmark catheter was retrieved to just proximal to the origin of the right vertebral artery. Measurements were again performed of the vertebral artery at this site. It was  decided to proceed with placement of a 3.5 mm x 18 mm Onyx Frontier balloon mountable stent. After having been prepped in the usual manner, this was then advanced and positioned such that the proximal portion of the device was just proximal to the origin of the right vertebral artery. Thereafter, controlled inflation was performed of the balloon mountable stent to approximately 8 atmospheres. This was maintained for approximately 10 seconds. The balloon was then deflated and retrieved and removed. A control arteriogram performed through the Benchmark guide catheter demonstrated excellent apposition and coverage of the previously occluded right vertebral artery origin and proximal portion. Free flow was now noted through the proximal stent into the intracranial portion with patency maintained of the positioned mid basilar artery stent. Control arteriograms were then performed at 15 and 25 minutes post deployment of the devices. These continued to demonstrate excellent apposition with flow extra cranially and intracranially. The patient at this time was also given approximately 4 mg of intra-arterial Integrilin in order to prevent formation of platelet aggregation at the stented site. None was obvious on diagnostic catheter arteriograms. The exchange guidewire was then retrieved and removed. A final control arteriogram performed through the Benchmark catheter and just proximal to the right vertebral artery origin continued to demonstrate excellent flow through the proximal stent and extra cranially and intracranially. There continued to be excellent flow through the basilar artery into the posterior cerebral arteries, the superior cerebellar arteries with faint opacification of both anterior-inferior cerebellar arteries. The Benchmark guide sheath was removed. The radial sheath was then removed with successful hemostasis with a wrist band at the right radial puncture site. Distal right radial pulse was verified to be  present. A CT of the brain performed on the table demonstrated no evidence of hemorrhage or hydrocephalus. The patient was then extubated. Upon recovery, the patient was able to converse appropriately. He denied any nausea, vomiting or headaches. He was able to move all 4 extremities spontaneously and to command. His pupils were 2 mm equal and sluggish. No change was seen in the right facial droop. The tongue remained in the midline. The patient was then returned to his room in the ICU in stable condition to continue on low-dose IV heparin and 81 aspirin daily, and Brilinta 90 mg b.i.d. Early in the evening, the patient was more awake, alert and appropriately responsive. He continued to have dysarthria with right facial droop. Neurological examination continued to have non sustained nystagmus on right lateral gaze and to a lesser degree left lateral gaze with oscillopsia on the right lateral gaze unchanged. 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 PLAN: Follow-up in clinic 2 weeks post discharge. Electronically Signed   By: Corky Downs.D.  On: 09/02/2021 05:39    Labs:  CBC: Recent Labs    08/31/21 0409 09/01/21 0311 09/02/21 0504 09/03/21 0214  WBC 13.7* 13.6* 10.8* 13.6*  HGB 12.8* 13.2 13.2 12.8*  HCT 38.2* 39.0 39.3 39.3  PLT 308 273 264 267    COAGS: Recent Labs    08/29/21 0932 08/29/21 2340  INR 1.0 1.0  APTT 28  --     BMP: Recent Labs    08/31/21 0409 09/01/21 0311 09/02/21 0504 09/03/21 0214  NA 134* 134* 137 135  K 3.3* 3.1* 3.9 3.1*  CL 104 103 105 102  CO2 21* _0 GLUCOSE 133* 110* 113* 103*  BUN _1 CALCIUM 8.0* 8.7* 8.9 8.7*  CREATININE 0.75 0.71 0.77 0.76  GFRNONAA >60 >60 >60 >60    LIVER FUNCTION TESTS: Recent  Labs    08/29/21 0932  BILITOT 0.8  AST 22  ALT 15  ALKPHOS 42  PROT 6.4*  ALBUMIN 3.7    Assessment and Plan:  65 y.o. 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 Estanislado Pandy on 08/30/21.   Patient making slow but good progress.  Will be d/c to inpatient rehab soon.   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  NIR planning on cerebral angiogram with intervention in the near future, ideally when patient recovers from CVA. Patient and his wife were instructed to contact [facility] to schedule the procedure.  They verbalized understanding.   Recommendation from NIR:  - Continue to take ASA 81 mg and Brilinta 90 mg BID.   - Drink plenty of water.  - Contact [facility] to schedule cerebral angiogram with intervention for right ICA stenosis when patient recovers from current stroke.   Further treatment plan per Stroke Appreciate and agree with the plan.  Please call NIR for questions and concerns.  Electronically Signed: Tera Mater, PA-C 09/03/2021, 11:42 AM   I spent a total of 25 Minutes at the the patient's bedside AND on the patient's hospital floor or unit, greater than 50% of which was counseling/coordinating care for Code stroke, s/p rt VA stent placement.   This chart was dictated using voice recognition software.  Despite best efforts to proofread,  errors can occur which can change the documentation meaning.

## 2021-09-03 NOTE — Progress Notes (Signed)
Physical Therapy Treatment Patient Details Name: Maurice Little MRN: [id number] DOB: 06/15/1956 Today's Date: 09/03/2021   History of Present Illness 65 y/o male presented to ED on 11/2 for R facial droop, R sided weakness, headache, and dizziness. 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 TEE 11/4. PMH: HTN, HLD, PVD    PT Comments    Pt motivated to participate in therapy. Pt overall requiring min-mod +2 assist for up to short-distance gait in room, pt with ataxic gait and significant unsteadiness. Pt with heavy preference for LE and truncal hyperextension in standing, PT addressing via verbal cuing "chest forward" and tactile facilitation at pelvic girdle. Pt with improved standing posture and balance with repeated cuing and transfer trials. Pt continues to demonstrate significant incoordination RLE, addressed via standing interventions (see misc exercises). PT to continue to follow, is an excellent CIR candidate.     Recommendations for follow up therapy are one component of a multi-disciplinary discharge planning process, led by the attending physician.  Recommendations may be updated based on patient status, additional functional criteria and insurance authorization.  Follow Up Recommendations  Acute inpatient rehab (3hours/day)     Assistance Recommended at Discharge Frequent or constant Supervision/Assistance  Equipment Recommendations  Other (comment) (TBD)    Recommendations for Other Services Rehab consult     Precautions / Restrictions Precautions Precautions: Fall Precaution Comments: diplopia, Ataxic R; L side numbness Restrictions Weight Bearing Restrictions: No     Mobility  Bed Mobility Overal bed mobility: Needs Assistance Bed Mobility: Supine to Sit     Supine to sit: Mod assist     General bed mobility comments: assist for  progression LEs to EOB, trunk elevation.    Transfers Overall transfer level: Needs assistance Equipment used: 2 person hand held assist;Rolling walker (2 wheels) Transfers: Sit to/from Stand Sit to Stand: Mod assist;+2 physical assistance;Min assist;From elevated surface           General transfer comment: Initially mod +2 for power up, rise, steadying, max cues for hand placement when rising, "nose over toes" when standing up to prevent excessive truncal extension. Transitioning to min +2 with continued, repeated practice. STS x4, x1 from EOB and x3 from recliner. RW placed in front of pt for last 2 stands to promote slight hip flexion.    Ambulation/Gait Ambulation/Gait assistance: Mod assist;+2 physical assistance Gait Distance (Feet): 5 Feet Assistive device: 2 person hand held assist Gait Pattern/deviations: Step-through pattern;Decreased stride length;Ataxic;Drifts right/left;Wide base of support;Leaning posteriorly Gait velocity: decr     General Gait Details: mod +2 for steadying, facilitating slight hip flexion at ASIS on R to prevent truncal hyperextension.   Stairs             Wheelchair Mobility    Modified Rankin (Stroke Patients Only) Modified Rankin (Stroke Patients Only) Pre-Morbid Rankin Score: No symptoms Modified Rankin: Moderately severe disability     Balance Overall balance assessment: Needs assistance Sitting-balance support: Bilateral upper extremity supported;Feet supported Sitting balance-Leahy Scale: Fair     Standing balance support: Bilateral upper extremity supported;During functional activity;Reliant on assistive device for balance Standing balance-Leahy Scale: Poor                              Cognition Arousal/Alertness: Awake/alert Behavior During Therapy: Flat affect Overall Cognitive Status: Impaired/Different from baseline Area  of Impairment: Safety/judgement                   Current Attention  Level: Selective   Following Commands: Follows one step commands with increased time;Follows one step commands consistently Safety/Judgement: Decreased awareness of safety Awareness: Emergent Problem Solving: Slow processing General Comments: follows one step commands well and consistently, very motivated but aware of deficits and need for rest breaks        Exercises Other Exercises Other Exercises: standing marches x5 each side Other Exercises: trash can toe taps with R foot x5, to address incoordination    General Comments General comments (skin integrity, edema, etc.): VSS      Pertinent Vitals/Pain Pain Assessment: No/denies pain    Home Living                          Prior Function            PT Goals (current goals can now be found in the care plan section) Acute Rehab PT Goals Patient Stated Goal: to get better PT Goal Formulation: With patient/family Time For Goal Achievement: 09/14/21 Potential to Achieve Goals: Good Progress towards PT goals: Progressing toward goals    Frequency    Min 4X/week      PT Plan Current plan remains appropriate    Co-evaluation PT/OT/SLP Co-Evaluation/Treatment: Yes Reason for Co-Treatment: Complexity of the patient's impairments (multi-system involvement);For patient/therapist safety;To address functional/ADL transfers PT goals addressed during session: Mobility/safety with mobility;Balance;Strengthening/ROM;Proper use of DME OT goals addressed during session: ADL's and self-care;Proper use of Adaptive equipment and DME;Strengthening/ROM      AM-PAC PT "6 Clicks" Mobility   Outcome Measure  Help needed turning from your back to your side while in a flat bed without using bedrails?: A Lot Help needed moving from lying on your back to sitting on the side of a flat bed without using bedrails?: A Lot Help needed moving to and from a bed to a chair (including a wheelchair)?: A Lot Help needed standing up from  a chair using your arms (e.g., wheelchair or bedside chair)?: A Lot Help needed to walk in hospital room?: A Lot Help needed climbing 3-5 steps with a railing? : Total 6 Click Score: 11    End of Session Equipment Utilized During Treatment: Gait belt Activity Tolerance: Patient tolerated treatment well Patient left: in chair;with call bell/phone within reach;with chair alarm set;with family/visitor present Nurse Communication: Mobility status;Need for lift equipment PT Visit Diagnosis: Unsteadiness on feet (R26.81);Muscle weakness (generalized) (M62.81);Other abnormalities of gait and mobility (R26.89);Dizziness and giddiness (R42)     Time: 3546-5681 PT Time Calculation (min) (ACUTE ONLY): 29 min  Charges:  $Neuromuscular Re-education: 8-22 mins                    Marye Round, PT DPT Acute Rehabilitation Services Pager [phone]  Office [phone]   Jensine Luz E Christain [place] 09/03/2021, 1:48 PM

## 2021-09-03 NOTE — Progress Notes (Addendum)
Inpatient Rehab Admissions Coordinator:  Spoke with pt's wife Jodie. Informed her that beginning insurance authorization for potential CIR admission. Will continue to follow.  ADDENDUM: Received insurance approval. Await medical clearance and bed availability.   Wolfgang Phoenix, MS, CCC-SLP Admissions Coordinator [phone]

## 2021-09-03 NOTE — Progress Notes (Addendum)
STROKE TEAM PROGRESS NOTE   SUBJECTIVE (INTERVAL HISTORY) Patient is lying in bed, awake, alert and in NAD. His wife is at bedside. Patient c/o of persistent double vision and decreased hearing on right with left side numbness. No new neurological symptoms observed. Foley placed yesterday due to urinary retention. All questions answered and verbalized understanding  Vital signs are stable.  Neurological exam unchanged.  TEE shows small PFO.  Lower extremity venous Dopplers negative for DVT. OBJECTIVE Temp:  [97.7 F (36.5 C)-98.5 F (36.9 C)] 97.7 F (36.5 C) (11/07 1200) Pulse Rate:  [58-90] 89 (11/07 1200) Cardiac Rhythm: Normal sinus rhythm (11/07 0800) Resp:  [12-19] 12 (11/07 1200) BP: (113-177)/(78-110) 138/96 (11/07 1200) SpO2:  [95 %-99 %] 95 % (11/07 1200)  Recent Labs  Lab 09/02/21 2005 09/02/21 2358 09/03/21 0343 09/03/21 0754 09/03/21 1151  GLUCAP 116* 105* 103* 103* 110*    Recent Labs  Lab 08/30/21 0412 08/31/21 0409 09/01/21 0311 09/02/21 0504 09/03/21 0214  NA 134* 134* 134* 137 135  K 3.3* 3.3* 3.1* 3.9 3.1*  CL 103 104 103 105 102  CO2 22 21* _0 GLUCOSE 128* 133* 110* 113* 103*  BUN 7* _1 CREATININE 0.74 0.75 0.71 0.77 0.76  CALCIUM 8.9 8.0* 8.7* 8.9 8.7*    Recent Labs  Lab 08/29/21 0932  AST 22  ALT 15  ALKPHOS 59  BILITOT 0.8  PROT 6.4*  ALBUMIN 3.7    Recent Labs  Lab 08/29/21 0932 08/29/21 0934 08/29/21 2340 08/31/21 0409 09/01/21 0311 09/02/21 0504 09/03/21 0214  WBC 11.0*  --  12.2* 13.7* 13.6* 10.8* 13.6*  NEUTROABS 5.0  --   --   --   --   --   --   HGB 13.7   < > 14.6 12.8* 13.2 13.2 12.8*  HCT 41.0   < > 43.7 38.2* 39.0 39.3 39.3  MCV 94.9  --  95.6 95.5 94.2 95.6 96.1  PLT 378  --  284 308 273 264 267   < > = values in this interval not displayed.    No results for input(s): CKTOTAL, CKMB, CKMBINDEX, TROPONINI in the last 168 hours. No results for input(s): LABPROT, INR in the last 72 hours.  No  results for input(s): COLORURINE, LABSPEC, [place], GLUCOSEU, HGBUR, BILIRUBINUR, KETONESUR, PROTEINUR, UROBILINOGEN, NITRITE, LEUKOCYTESUR in the last 72 hours.  Invalid input(s): APPERANCEUR     Component Value Date/Time   CHOL 288 (H) 08/29/2021 2340   TRIG 82 08/29/2021 2340   HDL 52 08/29/2021 2340   CHOLHDL 5.5 08/29/2021 2340   VLDL 16 08/29/2021 2340   LDLCALC 220 (H) 08/29/2021 2340   Lab Results  Component Value Date   HGBA1C 5.5 08/29/2021      Component Value Date/Time   LABOPIA NONE DETECTED 08/30/2021 0751   COCAINSCRNUR NONE DETECTED 08/30/2021 0751   LABBENZ NONE DETECTED 08/30/2021 0751   AMPHETMU NONE DETECTED 08/30/2021 0751   THCU POSITIVE (A) 08/30/2021 0751   LABBARB NONE DETECTED 08/30/2021 0751    No results for input(s): ETH in the last 168 hours.   MR BRAIN WO CONTRAST  Result Date: 09/01/2021 CLINICAL DATA:  Stroke, follow-up. 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 EXAM: MRI HEAD WITHOUT CONTRAST TECHNIQUE: Multiplanar, multiecho pulse sequences of the brain and surrounding structures were obtained without intravenous contrast. COMPARISON:  Head MRI 08/29/2021 FINDINGS: A limited examination was performed at the  request of the ordering provider consisting of axial and coronal diffusion weighted imaging and axial susceptibility weighted imaging. There is a large acute infarct in the right cerebellar hemisphere in the PICA territory which has increased in size from the prior MRI. Additional new patchy acute right cerebellar infarcts are present in the AICA territory, and there are also new and larger acute infarcts involving the right middle cerebellar peduncle, right medulla, and right greater than left pons. New punctate acute infarcts are noted in the medial aspects of the left parietal and left occipital lobes. There is a small amount of petechial hemorrhage associated with the right  PICA infarct. 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 Electronically Signed   By: Logan Bores M.D.   On: 09/01/2021 19:52   MR BRAIN WO CONTRAST  Result Date: 08/29/2021 CLINICAL DATA:  Stroke, follow up EXAM: MRI HEAD WITHOUT CONTRAST TECHNIQUE: Multiplanar, multiecho pulse sequences of the brain and surrounding structures were obtained without intravenous contrast. COMPARISON:  None. FINDINGS: Brain: Reduced diffusion is present in the inferior right cerebellum. Additional small foci of involvement in the right brachium pontis and right pontomedullary junction. No significant mass effect. No evidence of intracranial hemorrhage. Patchy foci of T2 hyperintensity in the supratentorial white matter are nonspecific but may reflect mild chronic microvascular ischemic changes. Ventricles and sulci are within normal limits in size and configuration. Vascular: Major vessel flow voids at the skull base are preserved. Skull and upper cervical spine: Normal marrow signal is preserved. Sinuses/Orbits: Paranasal sinus mucosal thickening. Orbits are unremarkable. Other: Sella is unremarkable.  Mastoid air cells are clear. 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 Small additional involvement of right brachium pontis and right pontomedullary junction. No hemorrhage or significant mass effect. Mild chronic microvascular ischemic changes. Electronically Signed   By: Macy Mis M.D.   On: 08/29/2021 13:03   IR Intra Cran Stent  Result Date: 09/02/2021 CLINICAL DATA:  History of progressive onset of dysarthria, nausea, vomiting and left-sided numbness. Patient also developed diplopia. 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 EXAM: INTRACRANIAL STENT (INCL PTA) COMPARISON:  CT  angiogram of the head and neck of August 29, 2021, and MRI of the brain of August 29, 2021. MEDICATIONS: Heparin 4,000 units IV. Ancef 2 g IV antibiotic was administered within 1 hour of the procedure. ANESTHESIA/SEDATION: Mac anesthesia as per Department of Anesthesiology for diagnostic portion followed by general anesthesia for endovascular treatment portion. CONTRAST:  Omnipaque 300 approximately 150 mL. FLUOROSCOPY TIME:  Fluoroscopy Time: 41 minutes 18 seconds (2310 mGy). COMPLICATIONS: None immediate. TECHNIQUE: Informed written consent was obtained from the patient after a thorough discussion of the procedural risks, benefits and alternatives. All questions were addressed. Maximal Sterile Barrier Technique was utilized including caps, mask, sterile gowns, sterile gloves, sterile drape, hand hygiene and skin antiseptic. A timeout was performed prior to the initiation of the procedure. The right forearm to the wrist was prepped and draped in the usual sterile manner. The right radial artery was then identified with ultrasound and its morphology documented. A dorsal palmar anastomosis was verified to be present. Using ultrasound guidance, and a micropuncture set access into the right radial artery was obtained with a 6/7 French radial sheath. The obturator, and the guidewire were removed. Good aspiration was obtained from the side port of the sheath.  Cocktail of 2000 units of heparin, 2.5 mg of verapamil, and 200 mcg of nitroglycerin was then infused through the sheath in diluted form without event. A right radial arteriogram was performed. Over a 0.035 inch Roadrunner guidewire, a 5 [place] Simmons 2 diagnostic catheter was advanced to the aortic arch region, and arteriograms were performed of the right common carotid artery, the left common carotid artery, the left vertebral artery, and the right subclavian artery at the origin of stump of the occluded right vertebral artery. FINDINGS: The diagnostic study  was somewhat marred by persistent motion artifact. The left common carotid arteriogram demonstrates the left external carotid artery and its major branches to be widely patent. The left internal carotid artery at the bulb demonstrates approximately 50% stenosis without evidence of intraluminal filling defects. More distally, the left internal carotid artery is seen to opacify to the cranial skull base. 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 A left posterior communicating artery is seen opacifying the left posterior cerebral artery distribution. The left middle cerebral artery and the left anterior cerebral artery opacify into the capillary and venous phases. Non dominant left vertebral artery demonstrates patency at its origin with brisk ascent to the cranial skull base to supply primarily the ipsilateral left posteroinferior cerebellar artery. The right common carotid arteriogram demonstrates the right external carotid artery and its major branches to be widely patent. The right internal carotid artery at the bulb has approximately 90% stenosis due to a circumferential smooth plaque. More distally, the vessel is seen to opacify to the cranial skull base. The petrous, the cavernous and the supraclinoid segments are widely patent. A right posterior communicating artery is seen opacifying the right posterior cerebral artery distribution. The right middle and the right anterior cerebral arteries opacify into the capillary and venous phases. The right subclavian arteriogram at the origin of the occluded right vertebral artery demonstrates partial reconstitution of the right vertebral artery at the level of C3-C4 from collaterals arising from the cervical branch of the thyrocervical trunk with opacification noted more distally into the dominant right vertebrobasilar junction. Opacification is noted into  the basilar artery, and retrogradely in the right vertebral artery to just above the site of the occlusion in the proximal right vertebral artery. ENDOVASCULAR REVASCULARIZATION OF OCCLUDED DOMINANT PROXIMAL RIGHT VERTEBRAL ARTERY WITH STENT ASSISTED ANGIOPLASTY, AND OF THE MID BASILAR ARTERY WITH STENT ASSISTED ANGIOPLASTY Over the exchange 035 inch 300 cm Rosen exchange guidewire, the combination of a 95 cm Benchmark guide catheter with a support 5.5 [place] Berenstein catheter was advanced and positioned just proximal to the stump of the occluded dominant right vertebral artery. The guidewire and the support catheter were gently retrieved and removed. Good aspiration was obtained from the hub of the Benchmark guide catheter. Bilateral roadmap was then obtained centered over the vertebral artery origin and distally through the [facility] guide catheter. Over an 0.014 inch standard Synchro micro guidewire with a moderate J configuration, inside of an 021 160 cm Trevo Trak microcatheter combination was advanced to just proximal to the occluded dominant right vertebral artery. Using a torque device with gentle manipulation access was obtained with the micro guidewire through the occluded right vertebral artery without difficulty. The guidewire was advanced to the vertebral artery followed by the microcatheter. The guidewire was removed. Good aspiration obtained from the hub of microcatheter. A control arteriogram was then performed through the microcatheter distally with opacification noted  in the right vertebrobasilar junction, the basilar artery, the right posterior cerebral artery, the superior cerebellar arteries and the anterior-inferior cerebellar arteries. Also noted was significant narrowing secondary to an eccentric arteriosclerotic plaque in the mid basilar artery just proximal to the origin of right anterior inferior cerebellar artery. The microcatheter was then exchanged under constant fluoroscopic guidance  for an 014 inch 300 cm Synchro standard micro guidewire with a moderate J configuration at its tip. Measurements were then performed of the most proximal portion of the right vertebral artery from the previous diagnostic arteriogram. It was decided to proceed with initial balloon angioplasty of the origin of the occluded right vertebral artery with a 2.5 mm x 15 mm Apex Monorail angioplasty balloon. This was prepped and purged antegradely with heparinized saline infusion, and retrogradely with 75% contrast and 25% heparinized saline infusion. Using rapid exchange technique, this was then advanced and positioned at the origin of the occluded right vertebral artery with the proximal marker proximal to the origin of the right vertebral artery. Control inflation was then performed using micro inflation syringe device via micro tubing to approximately 8 atmospheres where it was maintained for approximately 15 seconds. The balloon was deflated and retrieved and removed. Control arteriogram performed through [facility] guide catheter demonstrated significantly improved caliber and flow through the angioplastied segment with copious flow noted intracranially. 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 Patency was noted of the posterior cerebral arteries, the superior cerebellar arteries and faintly the anterior-inferior cerebellar arteries bilaterally. Significantly improved caliber was also noted at the origin of the right vertebral artery. A 120 cm Phenom Plus catheter was then advanced over the exchange micro guidewire under fluoroscopic guidance and positioned in the distal right vertebral artery. The Benchmark catheter was now advanced without difficulty into the distal right vertebral artery also. A roadmap was then obtained. Using a torque device, the exchange micro guidewire was safely advanced under fluoroscopic guidance into the proximal  basilar artery and then the distal basilar artery into the right posterior cerebral artery P1 segment. Measurements were then performed of the basilar artery distal and proximal to the prominent stenosis. It was decided to proceed with placement of a Resolute Onyx 3 mm x 15 mm balloon mounted stent intracranially. However, the stented stent delivery apparatus could not be advanced through the 045 Phenom Plus catheter. The Phenom Plus catheter was removed with the wire maintained in the right P1 segment. The Benchmark guide catheter was easily then advanced into the right vertebrobasilar junction just proximal to the basilar artery. The Resolute Onyx which had been prepped with heparinized saline infusion retrogradely and angiographically with 60% contrast and 40% heparinized saline infusion was reintroduced and advanced without difficulty over the exchange wire to the distal end of the Benchmark guide catheter. This stent apparatus was easily advanced and positioned with the proximal and the distal markers adequate distant from the site of the significant mid basilar artery stenosis. The stent was then deployed by inflating balloon using a micro inflation syringe device via micro tubing. Balloon was expanded to approximately 2.9 mm. The balloon was then deflated and retrieved. A control arteriogram performed through the Benchmark guide catheter demonstrated excellent apposition and flow through the stented mid basilar artery. Patency was seen of the posterior cerebral arteries, the superior cerebellar arteries and faintly the anterior-inferior cerebellar arteries. The Benchmark catheter was then retrieved more proximally while the exchange wire was retrieved into the V4 segment of  the right vertebral artery. The Benchmark catheter was retrieved to just proximal to the origin of the right vertebral artery. Measurements were again performed of the vertebral artery at this site. It was decided to proceed with placement  of a 3.5 mm x 18 mm Onyx Frontier balloon mountable stent. After having been prepped in the usual manner, this was then advanced and positioned such that the proximal portion of the device was just proximal to the origin of the right vertebral artery. Thereafter, controlled inflation was performed of the balloon mountable stent to approximately 8 atmospheres. This was maintained for approximately 10 seconds. The balloon was then deflated and retrieved and removed. A control arteriogram performed through the Benchmark guide catheter demonstrated excellent apposition and coverage of the previously occluded right vertebral artery origin and proximal portion. Free flow was now noted through the proximal stent into the intracranial portion with patency maintained of the positioned mid basilar artery stent. Control arteriograms were then performed at 15 and 25 minutes post deployment of the devices. These continued to demonstrate excellent apposition with flow extra cranially and intracranially. The patient at this time was also given approximately 4 mg of intra-arterial Integrilin in order to prevent formation of platelet aggregation at the stented site. None was obvious on diagnostic catheter arteriograms. The exchange guidewire was then retrieved and removed. A final control arteriogram performed through the Benchmark catheter and just proximal to the right vertebral artery origin continued to demonstrate excellent flow through the proximal stent and extra cranially and intracranially. There continued to be excellent flow through the basilar artery into the posterior cerebral arteries, the superior cerebellar arteries with faint opacification of both anterior-inferior cerebellar arteries. The Benchmark guide sheath was removed. The radial sheath was then removed with successful hemostasis with a wrist band at the right radial puncture site. Distal right radial pulse was verified to be present. A CT of the brain  performed on the table demonstrated no evidence of hemorrhage or hydrocephalus. The patient was then extubated. Upon recovery, the patient was able to converse appropriately. He denied any nausea, vomiting or headaches. He was able to move all 4 extremities spontaneously and to command. His pupils were 2 mm equal and sluggish. No change was seen in the right facial droop. The tongue remained in the midline. The patient was then returned to his room in the ICU in stable condition to continue on low-dose IV heparin and 81 aspirin daily, and Brilinta 90 mg b.i.d. Early in the evening, the patient was more awake, alert and appropriately responsive. He continued to have dysarthria with right facial droop. Neurological examination continued to have non sustained nystagmus on right lateral gaze and to a lesser degree left lateral gaze with oscillopsia on the right lateral gaze unchanged. 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 PLAN: Follow-up in clinic 2 weeks post discharge. Electronically Signed   By: Luanne Bras M.D.   On: 09/02/2021 05:39   IR CT Head Ltd  Result Date: 09/02/2021 CLINICAL DATA:  History of progressive onset of dysarthria, nausea, vomiting and left-sided numbness. Patient also developed diplopia. 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 EXAM: INTRACRANIAL STENT (INCL PTA) COMPARISON:  CT angiogram  of the head and neck of August 29, 2021, and MRI of the brain of August 29, 2021. MEDICATIONS: Heparin 4,000 units IV. Ancef 2 g IV antibiotic was administered within 1 hour of the procedure.  ANESTHESIA/SEDATION: Mac anesthesia as per Department of Anesthesiology for diagnostic portion followed by general anesthesia for endovascular treatment portion. CONTRAST:  Omnipaque 300 approximately 150 mL. FLUOROSCOPY TIME:  Fluoroscopy Time: 41 minutes 18 seconds (2310 mGy). COMPLICATIONS: None immediate. TECHNIQUE: Informed written consent was obtained from the patient after a thorough discussion of the procedural risks, benefits and alternatives. All questions were addressed. Maximal Sterile Barrier Technique was utilized including caps, mask, sterile gowns, sterile gloves, sterile drape, hand hygiene and skin antiseptic. A timeout was performed prior to the initiation of the procedure. The right forearm to the wrist was prepped and draped in the usual sterile manner. The right radial artery was then identified with ultrasound and its morphology documented. A dorsal palmar anastomosis was verified to be present. Using ultrasound guidance, and a micropuncture set access into the right radial artery was obtained with a 6/7 French radial sheath. The obturator, and the guidewire were removed. Good aspiration was obtained from the side port of the sheath. Cocktail of 2000 units of heparin, 2.5 mg of verapamil, and 200 mcg of nitroglycerin was then infused through the sheath in diluted form without event. A right radial arteriogram was performed. Over a 0.035 inch Roadrunner guidewire, a 5 [place] Simmons 2 diagnostic catheter was advanced to the aortic arch region, and arteriograms were performed of the right common carotid artery, the left common carotid artery, the left vertebral artery, and the right subclavian artery at the origin of stump of the occluded right vertebral artery. FINDINGS: The diagnostic study was somewhat marred by persistent motion artifact. The left common carotid arteriogram demonstrates the left external carotid artery and its major branches to be widely patent. The left internal carotid  artery at the bulb demonstrates approximately 50% stenosis without evidence of intraluminal filling defects. More distally, the left internal carotid artery is seen to opacify to the cranial skull base. 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 A left posterior communicating artery is seen opacifying the left posterior cerebral artery distribution. The left middle cerebral artery and the left anterior cerebral artery opacify into the capillary and venous phases. Non dominant left vertebral artery demonstrates patency at its origin with brisk ascent to the cranial skull base to supply primarily the ipsilateral left posteroinferior cerebellar artery. The right common carotid arteriogram demonstrates the right external carotid artery and its major branches to be widely patent. The right internal carotid artery at the bulb has approximately 90% stenosis due to a circumferential smooth plaque. More distally, the vessel is seen to opacify to the cranial skull base. The petrous, the cavernous and the supraclinoid segments are widely patent. A right posterior communicating artery is seen opacifying the right posterior cerebral artery distribution. The right middle and the right anterior cerebral arteries opacify into the capillary and venous phases. The right subclavian arteriogram at the origin of the occluded right vertebral artery demonstrates partial reconstitution of the right vertebral artery at the level of C3-C4 from collaterals arising from the cervical branch of the thyrocervical trunk with opacification noted more distally into the dominant right vertebrobasilar junction. Opacification is noted into the basilar artery, and retrogradely in the right vertebral artery to just above the site of the  occlusion in the proximal right vertebral artery. ENDOVASCULAR REVASCULARIZATION OF OCCLUDED DOMINANT  PROXIMAL RIGHT VERTEBRAL ARTERY WITH STENT ASSISTED ANGIOPLASTY, AND OF THE MID BASILAR ARTERY WITH STENT ASSISTED ANGIOPLASTY Over the exchange 035 inch 300 cm Rosen exchange guidewire, the combination of a 95 cm Benchmark guide catheter with a support 5.5 [place] Berenstein catheter was advanced and positioned just proximal to the stump of the occluded dominant right vertebral artery. The guidewire and the support catheter were gently retrieved and removed. Good aspiration was obtained from the hub of the Benchmark guide catheter. Bilateral roadmap was then obtained centered over the vertebral artery origin and distally through the [place] [facility] guide catheter. Over an 0.014 inch standard Synchro micro guidewire with a moderate J configuration, inside of an 021 160 cm Trevo Trak microcatheter combination was advanced to just proximal to the occluded dominant right vertebral artery. Using a torque device with gentle manipulation access was obtained with the micro guidewire through the occluded right vertebral artery without difficulty. The guidewire was advanced to the vertebral artery followed by the microcatheter. The guidewire was removed. Good aspiration obtained from the hub of microcatheter. A control arteriogram was then performed through the microcatheter distally with opacification noted in the right vertebrobasilar junction, the basilar artery, the right posterior cerebral artery, the superior cerebellar arteries and the anterior-inferior cerebellar arteries. Also noted was significant narrowing secondary to an eccentric arteriosclerotic plaque in the mid basilar artery just proximal to the origin of right anterior inferior cerebellar artery. The microcatheter was then exchanged under constant fluoroscopic guidance for an 014 inch 300 cm Synchro standard micro guidewire with a moderate J configuration at its tip. Measurements were then performed of the most proximal portion of the right vertebral artery from  the previous diagnostic arteriogram. It was decided to proceed with initial balloon angioplasty of the origin of the occluded right vertebral artery with a 2.5 mm x 15 mm Apex Monorail angioplasty balloon. This was prepped and purged antegradely with heparinized saline infusion, and retrogradely with 75% contrast and 25% heparinized saline infusion. Using rapid exchange technique, this was then advanced and positioned at the origin of the occluded right vertebral artery with the proximal marker proximal to the origin of the right vertebral artery. Control inflation was then performed using micro inflation syringe device via micro tubing to approximately 8 atmospheres where it was maintained for approximately 15 seconds. The balloon was deflated and retrieved and removed. Control arteriogram performed through [facility] guide catheter demonstrated significantly improved caliber and flow through the angioplastied segment with copious flow noted intracranially. 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 Patency was noted of the posterior cerebral arteries, the superior cerebellar arteries and faintly the anterior-inferior cerebellar arteries bilaterally. Significantly improved caliber was also noted at the origin of the right vertebral artery. A 120 cm Phenom Plus catheter was then advanced over the exchange micro guidewire under fluoroscopic guidance and positioned in the distal right vertebral artery. The Benchmark catheter was now advanced without difficulty into the distal right vertebral artery also. A roadmap was then obtained. Using a torque device, the exchange micro guidewire was safely advanced under fluoroscopic guidance into the proximal basilar artery and then the distal basilar artery into the right posterior cerebral artery P1 segment. Measurements were then performed of the basilar artery distal and proximal to the prominent  stenosis. It was decided to proceed with placement of a Resolute Onyx 3 mm x 15 mm balloon mounted  stent intracranially. However, the stented stent delivery apparatus could not be advanced through the 045 Phenom Plus catheter. The Phenom Plus catheter was removed with the wire maintained in the right P1 segment. The Benchmark guide catheter was easily then advanced into the right vertebrobasilar junction just proximal to the basilar artery. The Resolute Onyx which had been prepped with heparinized saline infusion retrogradely and angiographically with 60% contrast and 40% heparinized saline infusion was reintroduced and advanced without difficulty over the exchange wire to the distal end of the Benchmark guide catheter. This stent apparatus was easily advanced and positioned with the proximal and the distal markers adequate distant from the site of the significant mid basilar artery stenosis. The stent was then deployed by inflating balloon using a micro inflation syringe device via micro tubing. Balloon was expanded to approximately 2.9 mm. The balloon was then deflated and retrieved. A control arteriogram performed through the Benchmark guide catheter demonstrated excellent apposition and flow through the stented mid basilar artery. Patency was seen of the posterior cerebral arteries, the superior cerebellar arteries and faintly the anterior-inferior cerebellar arteries. The Benchmark catheter was then retrieved more proximally while the exchange wire was retrieved into the V4 segment of the right vertebral artery. The Benchmark catheter was retrieved to just proximal to the origin of the right vertebral artery. Measurements were again performed of the vertebral artery at this site. It was decided to proceed with placement of a 3.5 mm x 18 mm Onyx Frontier balloon mountable stent. After having been prepped in the usual manner, this was then advanced and positioned such that the proximal portion of the device was  just proximal to the origin of the right vertebral artery. Thereafter, controlled inflation was performed of the balloon mountable stent to approximately 8 atmospheres. This was maintained for approximately 10 seconds. The balloon was then deflated and retrieved and removed. A control arteriogram performed through the Benchmark guide catheter demonstrated excellent apposition and coverage of the previously occluded right vertebral artery origin and proximal portion. Free flow was now noted through the proximal stent into the intracranial portion with patency maintained of the positioned mid basilar artery stent. Control arteriograms were then performed at 15 and 25 minutes post deployment of the devices. These continued to demonstrate excellent apposition with flow extra cranially and intracranially. The patient at this time was also given approximately 4 mg of intra-arterial Integrilin in order to prevent formation of platelet aggregation at the stented site. None was obvious on diagnostic catheter arteriograms. The exchange guidewire was then retrieved and removed. A final control arteriogram performed through the Benchmark catheter and just proximal to the right vertebral artery origin continued to demonstrate excellent flow through the proximal stent and extra cranially and intracranially. There continued to be excellent flow through the basilar artery into the posterior cerebral arteries, the superior cerebellar arteries with faint opacification of both anterior-inferior cerebellar arteries. The Benchmark guide sheath was removed. The radial sheath was then removed with successful hemostasis with a wrist band at the right radial puncture site. Distal right radial pulse was verified to be present. A CT of the brain performed on the table demonstrated no evidence of hemorrhage or hydrocephalus. The patient was then extubated. Upon recovery, the patient was able to converse appropriately. He denied any nausea,  vomiting or headaches. He was able to move all 4 extremities spontaneously and to command. His pupils were 2 mm equal and sluggish. No change was seen in the right facial droop. The tongue  remained in the midline. The patient was then returned to his room in the ICU in stable condition to continue on low-dose IV heparin and 81 aspirin daily, and Brilinta 90 mg b.i.d. Early in the evening, the patient was more awake, alert and appropriately responsive. He continued to have dysarthria with right facial droop. Neurological examination continued to have non sustained nystagmus on right lateral gaze and to a lesser degree left lateral gaze with oscillopsia on the right lateral gaze unchanged. 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 PLAN: Follow-up in clinic 2 weeks post discharge. Electronically Signed   By: Luanne Bras M.D.   On: 09/02/2021 05:39   IR US Guide Vasc Access Right  Result Date: 09/02/2021 CLINICAL DATA:  History of progressive onset of dysarthria, nausea, vomiting and left-sided numbness. Patient also developed diplopia. 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 EXAM: INTRACRANIAL STENT (INCL PTA) COMPARISON:  CT angiogram of the head and neck of August 29, 2021, and MRI of the brain of August 29, 2021. MEDICATIONS: Heparin 4,000 units IV. Ancef 2 g IV antibiotic was administered within 1 hour of the procedure. ANESTHESIA/SEDATION: Mac anesthesia as per Department of Anesthesiology for diagnostic portion followed by general anesthesia for endovascular treatment portion. CONTRAST:  Omnipaque 300  approximately 150 mL. FLUOROSCOPY TIME:  Fluoroscopy Time: 41 minutes 18 seconds (2310 mGy). COMPLICATIONS: None immediate. TECHNIQUE: Informed written consent was obtained from the patient after a thorough discussion of the procedural risks, benefits and alternatives. All questions were addressed. Maximal Sterile Barrier Technique was utilized including caps, mask, sterile gowns, sterile gloves, sterile drape, hand hygiene and skin antiseptic. A timeout was performed prior to the initiation of the procedure. The right forearm to the wrist was prepped and draped in the usual sterile manner. The right radial artery was then identified with ultrasound and its morphology documented. A dorsal palmar anastomosis was verified to be present. Using ultrasound guidance, and a micropuncture set access into the right radial artery was obtained with a 6/7 French radial sheath. The obturator, and the guidewire were removed. Good aspiration was obtained from the side port of the sheath. Cocktail of 2000 units of heparin, 2.5 mg of verapamil, and 200 mcg of nitroglycerin was then infused through the sheath in diluted form without event. A right radial arteriogram was performed. Over a 0.035 inch Roadrunner guidewire, a 5 [place] Simmons 2 diagnostic catheter was advanced to the aortic arch region, and arteriograms were performed of the right common carotid artery, the left common carotid artery, the left vertebral artery, and the right subclavian artery at the origin of stump of the occluded right vertebral artery. FINDINGS: The diagnostic study was somewhat marred by persistent motion artifact. The left common carotid arteriogram demonstrates the left external carotid artery and its major branches to be widely patent. The left internal carotid artery at the bulb demonstrates approximately 50% stenosis without evidence of intraluminal filling defects. More distally, the left internal carotid artery is seen to opacify to the cranial  skull base. 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 A left posterior communicating artery is seen opacifying the  left posterior cerebral artery distribution. The left middle cerebral artery and the left anterior cerebral artery opacify into the capillary and venous phases. Non dominant left vertebral artery demonstrates patency at its origin with brisk ascent to the cranial skull base to supply primarily the ipsilateral left posteroinferior cerebellar artery. The right common carotid arteriogram demonstrates the right external carotid artery and its major branches to be widely patent. The right internal carotid artery at the bulb has approximately 90% stenosis due to a circumferential smooth plaque. More distally, the vessel is seen to opacify to the cranial skull base. The petrous, the cavernous and the supraclinoid segments are widely patent. A right posterior communicating artery is seen opacifying the right posterior cerebral artery distribution. The right middle and the right anterior cerebral arteries opacify into the capillary and venous phases. The right subclavian arteriogram at the origin of the occluded right vertebral artery demonstrates partial reconstitution of the right vertebral artery at the level of C3-C4 from collaterals arising from the cervical branch of the thyrocervical trunk with opacification noted more distally into the dominant right vertebrobasilar junction. Opacification is noted into the basilar artery, and retrogradely in the right vertebral artery to just above the site of the occlusion in the proximal right vertebral artery. ENDOVASCULAR REVASCULARIZATION OF OCCLUDED DOMINANT PROXIMAL RIGHT VERTEBRAL ARTERY WITH STENT ASSISTED ANGIOPLASTY, AND OF THE MID BASILAR ARTERY WITH STENT ASSISTED ANGIOPLASTY Over the exchange 035 inch 300 cm Rosen exchange guidewire, the  combination of a 95 cm Benchmark guide catheter with a support 5.5 [place] Berenstein catheter was advanced and positioned just proximal to the stump of the occluded dominant right vertebral artery. The guidewire and the support catheter were gently retrieved and removed. Good aspiration was obtained from the hub of the Benchmark guide catheter. Bilateral roadmap was then obtained centered over the vertebral artery origin and distally through the [facility] guide catheter. Over an 0.014 inch standard Synchro micro guidewire with a moderate J configuration, inside of an 021 160 cm Trevo Trak microcatheter combination was advanced to just proximal to the occluded dominant right vertebral artery. Using a torque device with gentle manipulation access was obtained with the micro guidewire through the occluded right vertebral artery without difficulty. The guidewire was advanced to the vertebral artery followed by the microcatheter. The guidewire was removed. Good aspiration obtained from the hub of microcatheter. A control arteriogram was then performed through the microcatheter distally with opacification noted in the right vertebrobasilar junction, the basilar artery, the right posterior cerebral artery, the superior cerebellar arteries and the anterior-inferior cerebellar arteries. Also noted was significant narrowing secondary to an eccentric arteriosclerotic plaque in the mid basilar artery just proximal to the origin of right anterior inferior cerebellar artery. The microcatheter was then exchanged under constant fluoroscopic guidance for an 014 inch 300 cm Synchro standard micro guidewire with a moderate J configuration at its tip. Measurements were then performed of the most proximal portion of the right vertebral artery from the previous diagnostic arteriogram. It was decided to proceed with initial balloon angioplasty of the origin of the occluded right vertebral artery with a 2.5 mm x 15 mm Apex Monorail  angioplasty balloon. This was prepped and purged antegradely with heparinized saline infusion, and retrogradely with 75% contrast and 25% heparinized saline infusion. Using rapid exchange technique, this was then advanced and positioned at the origin of the occluded right vertebral artery with the proximal marker proximal to the origin of the right vertebral artery. Control inflation was then performed  using micro inflation syringe device via micro tubing to approximately 8 atmospheres where it was maintained for approximately 15 seconds. The balloon was deflated and retrieved and removed. Control arteriogram performed through [facility] guide catheter demonstrated significantly improved caliber and flow through the angioplastied segment with copious flow noted intracranially. 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 Patency was noted of the posterior cerebral arteries, the superior cerebellar arteries and faintly the anterior-inferior cerebellar arteries bilaterally. Significantly improved caliber was also noted at the origin of the right vertebral artery. A 120 cm Phenom Plus catheter was then advanced over the exchange micro guidewire under fluoroscopic guidance and positioned in the distal right vertebral artery. The Benchmark catheter was now advanced without difficulty into the distal right vertebral artery also. A roadmap was then obtained. Using a torque device, the exchange micro guidewire was safely advanced under fluoroscopic guidance into the proximal basilar artery and then the distal basilar artery into the right posterior cerebral artery P1 segment. Measurements were then performed of the basilar artery distal and proximal to the prominent stenosis. It was decided to proceed with placement of a Resolute Onyx 3 mm x 15 mm balloon mounted stent intracranially. However, the stented stent delivery apparatus could not be advanced  through the 045 Phenom Plus catheter. The Phenom Plus catheter was removed with the wire maintained in the right P1 segment. The Benchmark guide catheter was easily then advanced into the right vertebrobasilar junction just proximal to the basilar artery. The Resolute Onyx which had been prepped with heparinized saline infusion retrogradely and angiographically with 60% contrast and 40% heparinized saline infusion was reintroduced and advanced without difficulty over the exchange wire to the distal end of the Benchmark guide catheter. This stent apparatus was easily advanced and positioned with the proximal and the distal markers adequate distant from the site of the significant mid basilar artery stenosis. The stent was then deployed by inflating balloon using a micro inflation syringe device via micro tubing. Balloon was expanded to approximately 2.9 mm. The balloon was then deflated and retrieved. A control arteriogram performed through the Benchmark guide catheter demonstrated excellent apposition and flow through the stented mid basilar artery. Patency was seen of the posterior cerebral arteries, the superior cerebellar arteries and faintly the anterior-inferior cerebellar arteries. The Benchmark catheter was then retrieved more proximally while the exchange wire was retrieved into the V4 segment of the right vertebral artery. The Benchmark catheter was retrieved to just proximal to the origin of the right vertebral artery. Measurements were again performed of the vertebral artery at this site. It was decided to proceed with placement of a 3.5 mm x 18 mm Onyx Frontier balloon mountable stent. After having been prepped in the usual manner, this was then advanced and positioned such that the proximal portion of the device was just proximal to the origin of the right vertebral artery. Thereafter, controlled inflation was performed of the balloon mountable stent to approximately 8 atmospheres. This was maintained  for approximately 10 seconds. The balloon was then deflated and retrieved and removed. A control arteriogram performed through the Benchmark guide catheter demonstrated excellent apposition and coverage of the previously occluded right vertebral artery origin and proximal portion. Free flow was now noted through the proximal stent into the intracranial portion with patency maintained of the positioned mid basilar artery stent. Control arteriograms were then performed at 15 and 25 minutes post deployment of the devices. These continued to demonstrate excellent  apposition with flow extra cranially and intracranially. The patient at this time was also given approximately 4 mg of intra-arterial Integrilin in order to prevent formation of platelet aggregation at the stented site. None was obvious on diagnostic catheter arteriograms. The exchange guidewire was then retrieved and removed. A final control arteriogram performed through the Benchmark catheter and just proximal to the right vertebral artery origin continued to demonstrate excellent flow through the proximal stent and extra cranially and intracranially. There continued to be excellent flow through the basilar artery into the posterior cerebral arteries, the superior cerebellar arteries with faint opacification of both anterior-inferior cerebellar arteries. The Benchmark guide sheath was removed. The radial sheath was then removed with successful hemostasis with a wrist band at the right radial puncture site. Distal right radial pulse was verified to be present. A CT of the brain performed on the table demonstrated no evidence of hemorrhage or hydrocephalus. The patient was then extubated. Upon recovery, the patient was able to converse appropriately. He denied any nausea, vomiting or headaches. He was able to move all 4 extremities spontaneously and to command. His pupils were 2 mm equal and sluggish. No change was seen in the right facial droop. The tongue  remained in the midline. The patient was then returned to his room in the ICU in stable condition to continue on low-dose IV heparin and 81 aspirin daily, and Brilinta 90 mg b.i.d. Early in the evening, the patient was more awake, alert and appropriately responsive. He continued to have dysarthria with right facial droop. Neurological examination continued to have non sustained nystagmus on right lateral gaze and to a lesser degree left lateral gaze with oscillopsia on the right lateral gaze unchanged. 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 PLAN: Follow-up in clinic 2 weeks post discharge. Electronically Signed   By: Luanne Bras M.D.   On: 09/02/2021 05:39   CT CEREBRAL PERFUSION W CONTRAST  Result Date: 08/29/2021 CLINICAL DATA:  Neuro deficit, acute, stroke suspected EXAM: CT ANGIOGRAPHY HEAD AND NECK CT PERFUSION BRAIN TECHNIQUE: Multidetector CT imaging of the head and neck was performed using the standard protocol during bolus administration of intravenous contrast. Multiplanar CT image reconstructions and MIPs were obtained to evaluate the vascular anatomy. Carotid stenosis measurements (when applicable) are obtained utilizing NASCET criteria, using the distal internal carotid diameter as the denominator. Multiphase CT imaging of the brain was performed following IV bolus contrast injection. Subsequent parametric perfusion maps were calculated using RAPID software. CONTRAST:  100 mL Omnipaque 350 COMPARISON:  None. FINDINGS: CTA NECK Aortic arch: Great vessel origins are patent. Right carotid system: Patent. Noncalcified plaque at the ICA origin causes 65% stenosis. Left carotid system: Patent. Mixed plaque at the ICA origin causes less than 50% stenosis. Vertebral arteries:  Left vertebral artery is patent. Absent opacification of the proximal right vertebral artery at the origin with minimal reconstitution of the V1 segment followed by reocclusion. The V2 segment is occluded proximally with reconstitution at the C4 level. Skeleton: Mild cervical spine degenerative changes, greatest at C6-C7. Other neck: Unremarkable. Upper chest: No apical lung mass. Review of the MIP images confirms the above findings CTA HEAD Anterior circulation: Intracranial internal carotid arteries are patent with mild calcified plaque. Anterior and middle cerebral arteries are patent. Posterior circulation: There is decreased caliber of the intracranial left  vertebral artery throughout its course. Appears to terminate at patent left PICA. Intracranial right vertebral artery is patent. Basilar is patent but there is low density within the lumen proximally. Superior cerebellar artery origins are patent. Bilateral posterior communicating arteries are present. Posterior cerebral arteries are patent. Venous sinuses: As permitted by contrast timing, patent. Review of the MIP images confirms the above findings CT Brain Perfusion Findings: CBF (<30%) Volume: 53m Perfusion (Tmax>6.0s) volume: 047mMismatch Volume: 65m265mnfarction Location: None. IMPRESSION: Abnormal extracranial right vertebral artery with limited opacification proximally and reconstitution at the C4 level. May reflect high-grade origin stenosis. Right PICA or like AICA origins are not identified. Basilar artery is patent but there is question of nonocclusive clot proximally. Noncalcified plaque at the right ICA origin causes 65% stenosis. Mixed plaque at the left ICA origin causes less than 50% stenosis. Diffuse mild narrowing of the intracranial left vertebral artery, which terminates as a PICA. Perfusion imaging demonstrates no evidence of core infarction or penumbra, but there is limited evaluation of the posterior fossa. Initial results were provided  by telephone at the time of interpretation on 08/29/2021 at 10:20 am to provider [facility] verbally acknowledged these results. Electronically Signed   By: PraMacy MisD.   On: 08/29/2021 10:44   ECHOCARDIOGRAM COMPLETE  Result Date: 08/30/2021    ECHOCARDIOGRAM REPORT   Patient Name:   Maurice Little Date of Exam: 08/30/2021 Medical Rec #:  [id number]:       72.0 in Accession #:    [id number]:       202.8 lb Date of Birth:  1/1July 12, 1957BSA:          2.143 m Patient Age:    65 49ars   BP:           155/93 mmHg Patient Gender: M          HR:           76 bpm. Exam Location:  Inpatient Procedure: 2D Echo, Color Doppler and Cardiac Doppler Indications:    Eval for PFO  History:        Patient has no prior history of Echocardiogram examinations.  Sonographer:    TayJyl Heinzferring Phys: [id number]. Left ventricular ejection fraction, by estimation, is 60 to 65%. The left ventricle has normal function. The left ventricle has no regional wall motion abnormalities. Left ventricular diastolic parameters are consistent with Grade II diastolic dysfunction (pseudonormalization).  2. Right ventricular systolic function is normal. The right ventricular size is normal. Tricuspid regurgitation signal is inadequate for assessing PA pressure.  3. The mitral valve is normal in structure. No evidence of mitral valve regurgitation. No evidence of mitral stenosis.  4. The aortic valve is tricuspid. Aortic valve regurgitation is not visualized. Mild aortic valve sclerosis is present, with no evidence of aortic valve stenosis.  5. The inferior vena cava is normal in size with greater than 50% respiratory variability, suggesting right atrial pressure of 3 mmHg. FINDINGS  Left Ventricle: Left ventricular ejection fraction, by estimation, is 60 to 65%. The left ventricle has normal function. The left ventricle has no regional wall motion abnormalities. The left ventricular internal  cavity size was normal in size. There is  no left ventricular hypertrophy. Left ventricular diastolic parameters are consistent with Grade II diastolic dysfunction (pseudonormalization). Right Ventricle: The right ventricular size is normal. No increase in right ventricular wall thickness. Right ventricular systolic function is  normal. Tricuspid regurgitation signal is inadequate for assessing PA pressure. Left Atrium: Left atrial size was normal in size. Right Atrium: Right atrial size was normal in size. Pericardium: There is no evidence of pericardial effusion. Mitral Valve: The mitral valve is normal in structure. No evidence of mitral valve regurgitation. No evidence of mitral valve stenosis. Tricuspid Valve: The tricuspid valve is normal in structure. Tricuspid valve regurgitation is not demonstrated. Aortic Valve: The aortic valve is tricuspid. Aortic valve regurgitation is not visualized. Mild aortic valve sclerosis is present, with no evidence of aortic valve stenosis. Aortic valve peak gradient measures 9.4 mmHg. Pulmonic Valve: The pulmonic valve was normal in structure. Pulmonic valve regurgitation is trivial. Aorta: The aortic root is normal in size and structure. Venous: The inferior vena cava is normal in size with greater than 50% respiratory variability, suggesting right atrial pressure of 3 mmHg. IAS/Shunts: No atrial level shunt detected by color flow Doppler.  LEFT VENTRICLE PLAX 2D LVIDd:         4.40 cm      Diastology LVIDs:         2.80 cm      LV e' medial:    7.07 cm/s LV PW:         1.00 cm      LV E/e' medial:  8.9 LV IVS:        1.00 cm      LV e' lateral:   6.85 cm/s LVOT diam:     2.10 cm      LV E/e' lateral: 9.2 LV SV:         84 LV SV Index:   39 LVOT Area:     3.46 cm  LV Volumes (MOD) LV vol d, MOD A2C: 96.1 ml LV vol d, MOD A4C: 107.0 ml LV vol s, MOD A2C: 43.0 ml LV vol s, MOD A4C: 44.4 ml LV SV MOD A2C:     53.1 ml LV SV MOD A4C:     107.0 ml LV SV MOD BP:      58.1 ml RIGHT  VENTRICLE             IVC RV Basal diam:  2.60 cm     IVC diam: 1.70 cm RV Mid diam:    2.50 cm RV S prime:     15.40 cm/s TAPSE (M-mode): 2.5 cm LEFT ATRIUM           Index        RIGHT ATRIUM           Index LA diam:      3.60 cm 1.68 cm/m   RA Area:     14.90 cm LA Vol (A2C): 37.2 ml 17.36 ml/m  RA Volume:   29.30 ml  13.67 ml/m LA Vol (A4C): 50.2 ml 23.42 ml/m  AORTIC VALVE AV Area (Vmax): 2.94 cm AV Vmax:        153.00 cm/s AV Peak Grad:   9.4 mmHg LVOT Vmax:      130.00 cm/s LVOT Vmean:     73.000 cm/s LVOT VTI:       0.243 m  AORTA Ao Root diam: 2.90 cm Ao Asc diam:  3.40 cm MITRAL VALVE MV Area (PHT): 3.31 cm    SHUNTS MV Decel Time: 229 msec    Systemic VTI:  0.24 m MV E velocity: 62.90 cm/s  Systemic Diam: 2.10 cm MV A velocity: 67.00 cm/s MV E/A ratio:  0.94 [employer] Electronically signed  by Franki Monte Signature Date/Time: 08/30/2021/2:45:21 PM    Final    CT HEAD CODE STROKE WO CONTRAST  Result Date: 08/29/2021 CLINICAL DATA:  Code stroke.  Left-sided weakness EXAM: CT HEAD WITHOUT CONTRAST TECHNIQUE: Contiguous axial images were obtained from the base of the skull through the vertex without intravenous contrast. COMPARISON:  None. FINDINGS: Brain: There is no acute intracranial hemorrhage, mass effect, or edema. Gray-white differentiation is preserved. There is an age-indeterminate small infarct of the right inferior cerebellum. Ventricles and sulci are normal in size and configuration. No extra-axial collection. Vascular: No hyperdense vessel. Intracranial atherosclerotic calcification at the skull base. Skull: Unremarkable. Sinuses/Orbits: Lobular mucosal thickening. Orbits are unremarkable. Other: Mastoid air cells are clear. ASPECTS [facility] Stroke Program Early CT Score) - Ganglionic level infarction (caudate, lentiform nuclei, internal capsule, insula, M1-M3 cortex): 7 - Supraganglionic infarction (M4-M6 cortex): 3 Total score (0-10 with 10 being normal): 10 IMPRESSION: There  is no acute intracranial hemorrhage. ASPECT score is 10. 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 These results were communicated to Dr. Rory Percy at 9:44 am on 08/29/2021 by text page via the [facility] messaging system. Electronically Signed   By: Macy Mis M.D.   On: 08/29/2021 09:46   VAS [place] LOWER EXTREMITY VENOUS (DVT)  Result Date: 08/31/2021  Lower Venous DVT Study Patient Name:  Maurice Little  Date of Exam:   08/31/2021 Medical Rec #: [id number]   Accession #:    [id number] Date of Birth: 03-26-1956    Patient Gender: M Patient Age:   65 years Exam Location:  [facility] Procedure:      VAS [place] LOWER EXTREMITY VENOUS (DVT) Referring Phys: Cornelius Moras XU --------------------------------------------------------------------------------  Indications: Stroke.  Comparison Study: no prior Performing Technologist: Archie Patten RVS  Examination Guidelines: A complete evaluation includes B-mode imaging, spectral Doppler, color Doppler, and power Doppler as needed of all accessible portions of each vessel. Bilateral testing is considered an integral part of a complete examination. Limited examinations for reoccurring indications may be performed as noted. The reflux portion of the exam is performed with the patient in reverse Trendelenburg.  +---------+---------------+---------+-----------+----------+--------------+ RIGHT    CompressibilityPhasicitySpontaneityPropertiesThrombus Aging +---------+---------------+---------+-----------+----------+--------------+ CFV      Full           Yes      Yes                                 +---------+---------------+---------+-----------+----------+--------------+ SFJ      Full                                                        +---------+---------------+---------+-----------+----------+--------------+ FV Prox  Full                                                         +---------+---------------+---------+-----------+----------+--------------+ FV Mid   Full                                                        +---------+---------------+---------+-----------+----------+--------------+  FV DistalFull                                                        +---------+---------------+---------+-----------+----------+--------------+ PFV      Full                                                        +---------+---------------+---------+-----------+----------+--------------+ POP      Full           Yes      Yes                                 +---------+---------------+---------+-----------+----------+--------------+ PTV      Full                                                        +---------+---------------+---------+-----------+----------+--------------+ PERO     Full                                                        +---------+---------------+---------+-----------+----------+--------------+   +---------+---------------+---------+-----------+----------+--------------+ LEFT     CompressibilityPhasicitySpontaneityPropertiesThrombus Aging +---------+---------------+---------+-----------+----------+--------------+ CFV      Full           Yes      Yes                                 +---------+---------------+---------+-----------+----------+--------------+ SFJ      Full                                                        +---------+---------------+---------+-----------+----------+--------------+ FV Prox  Full                                                        +---------+---------------+---------+-----------+----------+--------------+ FV Mid   Full                                                        +---------+---------------+---------+-----------+----------+--------------+ FV DistalFull                                                         +---------+---------------+---------+-----------+----------+--------------+  PFV      Full                                                        +---------+---------------+---------+-----------+----------+--------------+ POP      Full           Yes      Yes                                 +---------+---------------+---------+-----------+----------+--------------+ PTV      Full                                                        +---------+---------------+---------+-----------+----------+--------------+ PERO     Full                                                        +---------+---------------+---------+-----------+----------+--------------+     Summary: BILATERAL: - No evidence of deep vein thrombosis seen in the lower extremities, bilaterally. - No evidence of superficial venous thrombosis in the lower extremities, bilaterally. -   *See table(s) above for measurements and observations. Electronically signed by Monica Martinez MD on 08/31/2021 at 12:34:17 PM.    Final    CT ANGIO HEAD NECK W WO CM (CODE STROKE)  Result Date: 08/29/2021 CLINICAL DATA:  Neuro deficit, acute, stroke suspected EXAM: CT ANGIOGRAPHY HEAD AND NECK CT PERFUSION BRAIN TECHNIQUE: Multidetector CT imaging of the head and neck was performed using the standard protocol during bolus administration of intravenous contrast. Multiplanar CT image reconstructions and MIPs were obtained to evaluate the vascular anatomy. Carotid stenosis measurements (when applicable) are obtained utilizing NASCET criteria, using the distal internal carotid diameter as the denominator. Multiphase CT imaging of the brain was performed following IV bolus contrast injection. Subsequent parametric perfusion maps were calculated using RAPID software. CONTRAST:  100 mL Omnipaque 350 COMPARISON:  None. FINDINGS: CTA NECK Aortic arch: Great vessel origins are patent. Right carotid system: Patent. Noncalcified plaque at the ICA origin  causes 65% stenosis. Left carotid system: Patent. Mixed plaque at the ICA origin causes less than 50% stenosis. Vertebral arteries: Left vertebral artery is patent. Absent opacification of the proximal right vertebral artery at the origin with minimal reconstitution of the V1 segment followed by reocclusion. The V2 segment is occluded proximally with reconstitution at the C4 level. Skeleton: Mild cervical spine degenerative changes, greatest at C6-C7. Other neck: Unremarkable. Upper chest: No apical lung mass. Review of the MIP images confirms the above findings CTA HEAD Anterior circulation: Intracranial internal carotid arteries are patent with mild calcified plaque. Anterior and middle cerebral arteries are patent. Posterior circulation: There is decreased caliber of the intracranial left vertebral artery throughout its course. Appears to terminate at patent left PICA. Intracranial right vertebral artery is patent. Basilar is patent but there is low density within the lumen proximally. Superior cerebellar artery origins are patent. Bilateral posterior communicating arteries are present. Posterior cerebral arteries  are patent. Venous sinuses: As permitted by contrast timing, patent. Review of the MIP images confirms the above findings CT Brain Perfusion Findings: CBF (<30%) Volume: 46m Perfusion (Tmax>6.0s) volume: 053mMismatch Volume: 24m36mnfarction Location: None. IMPRESSION: Abnormal extracranial right vertebral artery with limited opacification proximally and reconstitution at the C4 level. May reflect high-grade origin stenosis. Right PICA or like AICA origins are not identified. Basilar artery is patent but there is question of nonocclusive clot proximally. Noncalcified plaque at the right ICA origin causes 65% stenosis. Mixed plaque at the left ICA origin causes less than 50% stenosis. Diffuse mild narrowing of the intracranial left vertebral artery, which terminates as a PICA. Perfusion imaging  demonstrates no evidence of core infarction or penumbra, but there is limited evaluation of the posterior fossa. Initial results were provided by telephone at the time of interpretation on 08/29/2021 at 10:20 am to provider [facility]who verbally acknowledged these results. Electronically Signed   By: PraMacy MisD.   On: 08/29/2021 10:44   IR ANGIO VERTEBRAL SEL SUBCLAVIAN INNOMINATE UNI L MOD SED  Result Date: 09/02/2021 CLINICAL DATA:  History of progressive onset of dysarthria, nausea, vomiting and left-sided numbness. Patient also developed diplopia. 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 EXAM: INTRACRANIAL STENT (INCL PTA) COMPARISON:  CT angiogram of the head and neck of August 29, 2021, and MRI of the brain of August 29, 2021. MEDICATIONS: Heparin 4,000 units IV. Ancef 2 g IV antibiotic was administered within 1 hour of the procedure. ANESTHESIA/SEDATION: Mac anesthesia as per Department of Anesthesiology for diagnostic portion followed by general anesthesia for endovascular treatment portion. CONTRAST:  Omnipaque 300 approximately 150 mL. FLUOROSCOPY TIME:  Fluoroscopy Time: 41 minutes 18 seconds (2310 mGy). COMPLICATIONS: None immediate. TECHNIQUE: Informed written consent was obtained from the patient after a thorough discussion of the procedural risks, benefits and alternatives. All questions were addressed. Maximal Sterile Barrier Technique was utilized including caps, mask, sterile gowns, sterile gloves, sterile drape, hand hygiene and skin antiseptic. A timeout was performed prior to the initiation of the procedure. The right forearm to the wrist was prepped and draped in the usual sterile manner. The right radial artery was then identified with ultrasound and its morphology documented. A dorsal palmar  anastomosis was verified to be present. Using ultrasound guidance, and a micropuncture set access into the right radial artery was obtained with a 6/7 French radial sheath. The obturator, and the guidewire were removed. Good aspiration was obtained from the side port of the sheath. Cocktail of 2000 units of heparin, 2.5 mg of verapamil, and 200 mcg of nitroglycerin was then infused through the sheath in diluted form without event. A right radial arteriogram was performed. Over a 0.035 inch Roadrunner guidewire, a 5 Fre[place] 2 diagnostic catheter was advanced to the aortic arch region, and arteriograms were performed of the right common carotid artery, the left common carotid artery, the left vertebral artery, and the right subclavian artery at the origin of stump of the occluded right vertebral artery. FINDINGS: The diagnostic study was somewhat marred by persistent motion artifact. The left common carotid arteriogram demonstrates the left external carotid artery and its major branches to be widely patent. The left internal carotid artery at the bulb demonstrates approximately 50% stenosis without evidence of intraluminal filling defects. More distally, the left internal carotid artery is seen  to opacify to the cranial skull base. 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 A left posterior communicating artery is seen opacifying the left posterior cerebral artery distribution. The left middle cerebral artery and the left anterior cerebral artery opacify into the capillary and venous phases. Non dominant left vertebral artery demonstrates patency at its origin with brisk ascent to the cranial skull base to supply primarily the ipsilateral left posteroinferior cerebellar artery. The right common carotid arteriogram demonstrates the right external carotid artery and its major branches to be widely  patent. The right internal carotid artery at the bulb has approximately 90% stenosis due to a circumferential smooth plaque. More distally, the vessel is seen to opacify to the cranial skull base. The petrous, the cavernous and the supraclinoid segments are widely patent. A right posterior communicating artery is seen opacifying the right posterior cerebral artery distribution. The right middle and the right anterior cerebral arteries opacify into the capillary and venous phases. The right subclavian arteriogram at the origin of the occluded right vertebral artery demonstrates partial reconstitution of the right vertebral artery at the level of C3-C4 from collaterals arising from the cervical branch of the thyrocervical trunk with opacification noted more distally into the dominant right vertebrobasilar junction. Opacification is noted into the basilar artery, and retrogradely in the right vertebral artery to just above the site of the occlusion in the proximal right vertebral artery. ENDOVASCULAR REVASCULARIZATION OF OCCLUDED DOMINANT PROXIMAL RIGHT VERTEBRAL ARTERY WITH STENT ASSISTED ANGIOPLASTY, AND OF THE MID BASILAR ARTERY WITH STENT ASSISTED ANGIOPLASTY Over the exchange 035 inch 300 cm Rosen exchange guidewire, the combination of a 95 cm Benchmark guide catheter with a support 5.5 [place] Berenstein catheter was advanced and positioned just proximal to the stump of the occluded dominant right vertebral artery. The guidewire and the support catheter were gently retrieved and removed. Good aspiration was obtained from the hub of the Benchmark guide catheter. Bilateral roadmap was then obtained centered over the vertebral artery origin and distally through the [facility] guide catheter. Over an 0.014 inch standard Synchro micro guidewire with a moderate J configuration, inside of an 021 160 cm Trevo Trak microcatheter combination was advanced to just proximal to the occluded dominant right vertebral artery. Using  a torque device with gentle manipulation access was obtained with the micro guidewire through the occluded right vertebral artery without difficulty. The guidewire was advanced to the vertebral artery followed by the microcatheter. The guidewire was removed. Good aspiration obtained from the hub of microcatheter. A control arteriogram was then performed through the microcatheter distally with opacification noted in the right vertebrobasilar junction, the basilar artery, the right posterior cerebral artery, the superior cerebellar arteries and the anterior-inferior cerebellar arteries. Also noted was significant narrowing secondary to an eccentric arteriosclerotic plaque in the mid basilar artery just proximal to the origin of right anterior inferior cerebellar artery. The microcatheter was then exchanged under constant fluoroscopic guidance for an 014 inch 300 cm Synchro standard micro guidewire with a moderate J configuration at its tip. Measurements were then performed of the most proximal portion of the right vertebral artery from the previous diagnostic arteriogram. It was decided to proceed with initial balloon angioplasty of the origin of the occluded right vertebral artery with a 2.5 mm x 15 mm Apex Monorail angioplasty balloon. This was prepped and purged antegradely with heparinized saline infusion, and retrogradely with 75% contrast and 25% heparinized saline infusion.  Using rapid exchange technique, this was then advanced and positioned at the origin of the occluded right vertebral artery with the proximal marker proximal to the origin of the right vertebral artery. Control inflation was then performed using micro inflation syringe device via micro tubing to approximately 8 atmospheres where it was maintained for approximately 15 seconds. The balloon was deflated and retrieved and removed. Control arteriogram performed through [facility] guide catheter demonstrated significantly improved caliber and flow  through the angioplastied segment with copious flow noted intracranially. 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 Patency was noted of the posterior cerebral arteries, the superior cerebellar arteries and faintly the anterior-inferior cerebellar arteries bilaterally. Significantly improved caliber was also noted at the origin of the right vertebral artery. A 120 cm Phenom Plus catheter was then advanced over the exchange micro guidewire under fluoroscopic guidance and positioned in the distal right vertebral artery. The Benchmark catheter was now advanced without difficulty into the distal right vertebral artery also. A roadmap was then obtained. Using a torque device, the exchange micro guidewire was safely advanced under fluoroscopic guidance into the proximal basilar artery and then the distal basilar artery into the right posterior cerebral artery P1 segment. Measurements were then performed of the basilar artery distal and proximal to the prominent stenosis. It was decided to proceed with placement of a Resolute Onyx 3 mm x 15 mm balloon mounted stent intracranially. However, the stented stent delivery apparatus could not be advanced through the 045 Phenom Plus catheter. The Phenom Plus catheter was removed with the wire maintained in the right P1 segment. The Benchmark guide catheter was easily then advanced into the right vertebrobasilar junction just proximal to the basilar artery. The Resolute Onyx which had been prepped with heparinized saline infusion retrogradely and angiographically with 60% contrast and 40% heparinized saline infusion was reintroduced and advanced without difficulty over the exchange wire to the distal end of the Benchmark guide catheter. This stent apparatus was easily advanced and positioned with the proximal and the distal markers adequate distant from the site of the significant mid basilar artery stenosis.  The stent was then deployed by inflating balloon using a micro inflation syringe device via micro tubing. Balloon was expanded to approximately 2.9 mm. The balloon was then deflated and retrieved. A control arteriogram performed through the Benchmark guide catheter demonstrated excellent apposition and flow through the stented mid basilar artery. Patency was seen of the posterior cerebral arteries, the superior cerebellar arteries and faintly the anterior-inferior cerebellar arteries. The Benchmark catheter was then retrieved more proximally while the exchange wire was retrieved into the V4 segment of the right vertebral artery. The Benchmark catheter was retrieved to just proximal to the origin of the right vertebral artery. Measurements were again performed of the vertebral artery at this site. It was decided to proceed with placement of a 3.5 mm x 18 mm Onyx Frontier balloon mountable stent. After having been prepped in the usual manner, this was then advanced and positioned such that the proximal portion of the device was just proximal to the origin of the right vertebral artery. Thereafter, controlled inflation was performed of the balloon mountable stent to approximately 8 atmospheres. This was maintained for approximately 10 seconds. The balloon was then deflated and retrieved and removed. A control arteriogram performed through the Benchmark guide catheter demonstrated excellent apposition and coverage of the previously occluded right vertebral artery origin and proximal portion. Free flow was now noted through  the proximal stent into the intracranial portion with patency maintained of the positioned mid basilar artery stent. Control arteriograms were then performed at 15 and 25 minutes post deployment of the devices. These continued to demonstrate excellent apposition with flow extra cranially and intracranially. The patient at this time was also given approximately 4 mg of intra-arterial Integrilin in  order to prevent formation of platelet aggregation at the stented site. None was obvious on diagnostic catheter arteriograms. The exchange guidewire was then retrieved and removed. A final control arteriogram performed through the Benchmark catheter and just proximal to the right vertebral artery origin continued to demonstrate excellent flow through the proximal stent and extra cranially and intracranially. There continued to be excellent flow through the basilar artery into the posterior cerebral arteries, the superior cerebellar arteries with faint opacification of both anterior-inferior cerebellar arteries. The Benchmark guide sheath was removed. The radial sheath was then removed with successful hemostasis with a wrist band at the right radial puncture site. Distal right radial pulse was verified to be present. A CT of the brain performed on the table demonstrated no evidence of hemorrhage or hydrocephalus. The patient was then extubated. Upon recovery, the patient was able to converse appropriately. He denied any nausea, vomiting or headaches. He was able to move all 4 extremities spontaneously and to command. His pupils were 2 mm equal and sluggish. No change was seen in the right facial droop. The tongue remained in the midline. The patient was then returned to his room in the ICU in stable condition to continue on low-dose IV heparin and 81 aspirin daily, and Brilinta 90 mg b.i.d. Early in the evening, the patient was more awake, alert and appropriately responsive. He continued to have dysarthria with right facial droop. Neurological examination continued to have non sustained nystagmus on right lateral gaze and to a lesser degree left lateral gaze with oscillopsia on the right lateral gaze unchanged. IMPRESSION: Status post endovascular revascularization of occluded dominant right vertebral artery at its origin and proximally with stent assisted angioplasty, and of significant stenosis in the mid basilar  artery with stent assisted angioplasty as described above. PLAN: Follow-up in clinic 2 weeks post discharge. Electronically Signed   By: Luanne Bras M.D.   On: 09/02/2021 05:39   IR ANGIO VERTEBRAL SEL SUBCLAVIAN INNOMINATE UNI R MOD SED  Result Date: 09/02/2021 CLINICAL DATA:  History of progressive onset of dysarthria, nausea, vomiting and left-sided numbness. Patient also developed diplopia. 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 EXAM: INTRACRANIAL STENT (INCL PTA) COMPARISON:  CT angiogram of the head and neck of August 29, 2021, and MRI of the brain of August 29, 2021. MEDICATIONS: Heparin 4,000 units IV. Ancef 2 g IV antibiotic was administered within 1 hour of the procedure. ANESTHESIA/SEDATION: Mac anesthesia as per Department of Anesthesiology for diagnostic portion followed by general anesthesia for endovascular treatment portion. CONTRAST:  Omnipaque 300 approximately 150 mL. FLUOROSCOPY TIME:  Fluoroscopy Time: 41 minutes 18 seconds (2310 mGy). COMPLICATIONS: None immediate. TECHNIQUE: Informed written consent was obtained from the patient after a thorough discussion of the procedural risks, benefits and alternatives. All questions were addressed. Maximal Sterile Barrier Technique was utilized including caps, mask, sterile gowns, sterile gloves, sterile drape, hand hygiene and skin antiseptic. A timeout was performed prior to the initiation of the procedure. The right forearm to the wrist was  prepped and draped in the usual sterile manner. The right radial artery was then identified with ultrasound and its morphology documented. A dorsal palmar anastomosis was verified to be present. Using ultrasound guidance, and a micropuncture set access into the right radial artery was obtained with a 6/7 French radial sheath.  The obturator, and the guidewire were removed. Good aspiration was obtained from the side port of the sheath. Cocktail of 2000 units of heparin, 2.5 mg of verapamil, and 200 mcg of nitroglycerin was then infused through the sheath in diluted form without event. A right radial arteriogram was performed. Over a 0.035 inch Roadrunner guidewire, a 5 [place] Simmons 2 diagnostic catheter was advanced to the aortic arch region, and arteriograms were performed of the right common carotid artery, the left common carotid artery, the left vertebral artery, and the right subclavian artery at the origin of stump of the occluded right vertebral artery. FINDINGS: The diagnostic study was somewhat marred by persistent motion artifact. The left common carotid arteriogram demonstrates the left external carotid artery and its major branches to be widely patent. The left internal carotid artery at the bulb demonstrates approximately 50% stenosis without evidence of intraluminal filling defects. More distally, the left internal carotid artery is seen to opacify to the cranial skull base. 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 A left posterior communicating artery is seen opacifying the left posterior cerebral artery distribution. The left middle cerebral artery and the left anterior cerebral artery opacify into the capillary and venous phases. Non dominant left vertebral artery demonstrates patency at its origin with brisk ascent to the cranial skull base to supply primarily the ipsilateral left posteroinferior cerebellar artery. The right common carotid arteriogram demonstrates the right external carotid artery and its major branches to be widely patent. The right internal carotid artery at the bulb has approximately 90% stenosis due to a circumferential smooth plaque. More distally, the vessel is seen to opacify to  the cranial skull base. The petrous, the cavernous and the supraclinoid segments are widely patent. A right posterior communicating artery is seen opacifying the right posterior cerebral artery distribution. The right middle and the right anterior cerebral arteries opacify into the capillary and venous phases. The right subclavian arteriogram at the origin of the occluded right vertebral artery demonstrates partial reconstitution of the right vertebral artery at the level of C3-C4 from collaterals arising from the cervical branch of the thyrocervical trunk with opacification noted more distally into the dominant right vertebrobasilar junction. Opacification is noted into the basilar artery, and retrogradely in the right vertebral artery to just above the site of the occlusion in the proximal right vertebral artery. ENDOVASCULAR REVASCULARIZATION OF OCCLUDED DOMINANT PROXIMAL RIGHT VERTEBRAL ARTERY WITH STENT ASSISTED ANGIOPLASTY, AND OF THE MID BASILAR ARTERY WITH STENT ASSISTED ANGIOPLASTY Over the exchange 035 inch 300 cm Rosen exchange guidewire, the combination of a 95 cm Benchmark guide catheter with a support 5.5 [place] Berenstein catheter was advanced and positioned just proximal to the stump of the occluded dominant right vertebral artery. The guidewire and the support catheter were gently retrieved and removed. Good aspiration was obtained from the hub of the Benchmark guide catheter. Bilateral roadmap was then obtained centered over the vertebral artery origin and distally through the [facility] guide catheter. Over an 0.014 inch standard Synchro micro guidewire with a moderate J configuration, inside of an 021 160 cm Trevo Trak microcatheter combination was advanced  to just proximal to the occluded dominant right vertebral artery. Using a torque device with gentle manipulation access was obtained with the micro guidewire through the occluded right vertebral artery without difficulty. The guidewire was  advanced to the vertebral artery followed by the microcatheter. The guidewire was removed. Good aspiration obtained from the hub of microcatheter. A control arteriogram was then performed through the microcatheter distally with opacification noted in the right vertebrobasilar junction, the basilar artery, the right posterior cerebral artery, the superior cerebellar arteries and the anterior-inferior cerebellar arteries. Also noted was significant narrowing secondary to an eccentric arteriosclerotic plaque in the mid basilar artery just proximal to the origin of right anterior inferior cerebellar artery. The microcatheter was then exchanged under constant fluoroscopic guidance for an 014 inch 300 cm Synchro standard micro guidewire with a moderate J configuration at its tip. Measurements were then performed of the most proximal portion of the right vertebral artery from the previous diagnostic arteriogram. It was decided to proceed with initial balloon angioplasty of the origin of the occluded right vertebral artery with a 2.5 mm x 15 mm Apex Monorail angioplasty balloon. This was prepped and purged antegradely with heparinized saline infusion, and retrogradely with 75% contrast and 25% heparinized saline infusion. Using rapid exchange technique, this was then advanced and positioned at the origin of the occluded right vertebral artery with the proximal marker proximal to the origin of the right vertebral artery. Control inflation was then performed using micro inflation syringe device via micro tubing to approximately 8 atmospheres where it was maintained for approximately 15 seconds. The balloon was deflated and retrieved and removed. Control arteriogram performed through [facility] guide catheter demonstrated significantly improved caliber and flow through the angioplastied segment with copious flow noted intracranially. Intracranially again demonstrated was significant mid basilar artery stenosis secondary to a  soft eccentric plaque. Patency was noted of the posterior cerebral arteries, the superior cerebellar arteries and faintly the anterior-inferior cerebellar arteries bilaterally. Significantly improved caliber was also noted at the origin of the right vertebral artery. A 120 cm Phenom Plus catheter was then advanced over the exchange micro guidewire under fluoroscopic guidance and positioned in the distal right vertebral artery. The Benchmark catheter was now advanced without difficulty into the distal right vertebral artery also. A roadmap was then obtained. Using a torque device, the exchange micro guidewire was safely advanced under fluoroscopic guidance into the proximal basilar artery and then the distal basilar artery into the right posterior cerebral artery P1 segment. Measurements were then performed of the basilar artery distal and proximal to the prominent stenosis. It was decided to proceed with placement of a Resolute Onyx 3 mm x 15 mm balloon mounted stent intracranially. However, the stented stent delivery apparatus could not be advanced through the 045 Phenom Plus catheter. The Phenom Plus catheter was removed with the wire maintained in the right P1 segment. The Benchmark guide catheter was easily then advanced into the right vertebrobasilar junction just proximal to the basilar artery. The Resolute Onyx which had been prepped with heparinized saline infusion retrogradely and angiographically with 60% contrast and 40% heparinized saline infusion was reintroduced and advanced without difficulty over the exchange wire to the distal end of the Benchmark guide catheter. This stent apparatus was easily advanced and positioned with the proximal and the distal markers adequate distant from the site of the significant mid basilar artery stenosis. The stent was then deployed by inflating balloon using a micro inflation syringe device via micro tubing. Balloon was  expanded to approximately 2.9 mm. The balloon  was then deflated and retrieved. A control arteriogram performed through the Benchmark guide catheter demonstrated excellent apposition and flow through the stented mid basilar artery. Patency was seen of the posterior cerebral arteries, the superior cerebellar arteries and faintly the anterior-inferior cerebellar arteries. The Benchmark catheter was then retrieved more proximally while the exchange wire was retrieved into the V4 segment of the right vertebral artery. The Benchmark catheter was retrieved to just proximal to the origin of the right vertebral artery. Measurements were again performed of the vertebral artery at this site. It was decided to proceed with placement of a 3.5 mm x 18 mm Onyx Frontier balloon mountable stent. After having been prepped in the usual manner, this was then advanced and positioned such that the proximal portion of the device was just proximal to the origin of the right vertebral artery. Thereafter, controlled inflation was performed of the balloon mountable stent to approximately 8 atmospheres. This was maintained for approximately 10 seconds. The balloon was then deflated and retrieved and removed. A control arteriogram performed through the Benchmark guide catheter demonstrated excellent apposition and coverage of the previously occluded right vertebral artery origin and proximal portion. Free flow was now noted through the proximal stent into the intracranial portion with patency maintained of the positioned mid basilar artery stent. Control arteriograms were then performed at 15 and 25 minutes post deployment of the devices. These continued to demonstrate excellent apposition with flow extra cranially and intracranially. The patient at this time was also given approximately 4 mg of intra-arterial Integrilin in order to prevent formation of platelet aggregation at the stented site. None was obvious on diagnostic catheter arteriograms. The exchange guidewire was then retrieved  and removed. A final control arteriogram performed through the Benchmark catheter and just proximal to the right vertebral artery origin continued to demonstrate excellent flow through the proximal stent and extra cranially and intracranially. There continued to be excellent flow through the basilar artery into the posterior cerebral arteries, the superior cerebellar arteries with faint opacification of both anterior-inferior cerebellar arteries. The Benchmark guide sheath was removed. The radial sheath was then removed with successful hemostasis with a wrist band at the right radial puncture site. Distal right radial pulse was verified to be present. A CT of the brain performed on the table demonstrated no evidence of hemorrhage or hydrocephalus. The patient was then extubated. Upon recovery, the patient was able to converse appropriately. He denied any nausea, vomiting or headaches. He was able to move all 4 extremities spontaneously and to command. His pupils were 2 mm equal and sluggish. No change was seen in the right facial droop. The tongue remained in the midline. The patient was then returned to his room in the ICU in stable condition to continue on low-dose IV heparin and 81 aspirin daily, and Brilinta 90 mg b.i.d. Early in the evening, the patient was more awake, alert and appropriately responsive. He continued to have dysarthria with right facial droop. Neurological examination continued to have non sustained nystagmus on right lateral gaze and to a lesser degree left lateral gaze with oscillopsia on the right lateral gaze unchanged. 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 PLAN: Follow-up in clinic 2 weeks post discharge. Electronically Signed   By: Luanne Bras M.D.   On:  09/02/2021 05:39     PHYSICAL EXAM  Temp:  [97.7 F (36.5 C)-98.5 F (36.9 C)] 97.7 F (36.5 C) (11/07 1200) Pulse Rate:  [58-90] 89 (11/07 1200) Resp:  [12-19] 12 (11/07 1200) BP: (113-177)/(78-110) 138/96 (11/07 1200) SpO2:  [95 %-99 %] 95 % (11/07 1200)  General - Well nourished, well developed, pleasant middle-age Caucasian Little alert.  Not in any distress.  Cardiovascular - Regular rhythm and rate.   NEURO:  Mental Status: AA&Ox3. Can hold a conversation. Speech/Language: mild dysarthria  Naming, repetition, fluency, and comprehension intact.  Cranial Nerves:  II: PERRL. Diplopia persists esp on right lateral gaze. There is nystagmus noted on right lateral gaze. Right INO III, IV, VI: EOMI. Eyelids elevate symmetrically.  V: Patient c/o numbness to face bilaterally but can perceive light touch  VII: Right facial droop present VIII: hearing intact to voice on left, right sided hearing loss persists to finger rub. IX, X: Phonation is normal.  XII: without fasciculations. Speech is dysarthric but intelligible.  Motor: LUE/ LLE 5/5, RUE and RLE 4/5 Tone: is normal and bulk is normal Sensation- Intact to light touch on right, left sided hemibody numbness persists.  Coordination: FTN intact on left with right sided ataxia mild to moderate, HKS: no ataxia in LE.No drift.  Gait- deferred  ASSESSMENT/PLAN Maurice Little is a 65 y.o. Little with no known medical history admitted for left-sided numbness, dizziness, headache diplopia.  Developed right facial droop, slurred speech and right hearing loss overnight.  No tPA given due to outside window. Stenting of basilar and vertebral arteries was performed. Repeat MRI post procedure demonstrates worsening and new infarcts. Urinary retention continues, will continue flomax and mobilize patient.  Patient will be discharged to CIR and will return in approximately 6 weeks for stenting of the right carotid artery.  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 Cardioembolic etiology less likely  Resultant right peripheral facial droop (CN VII), right lateral gaze incomplete (CN VI), right hearing loss (CN VIII and AICA territory), left sided numbness (right spinal lemniscus and right trigeminothalamic tract), right cerebellum (right PICA and AICA) CT showed right cerebellar infarct CT head and neck right VA origin occlusion, reconstituted at distal V2.  Right PICA occlusion, mid basilar artery high-grade stenosis versus thrombosis, right ICA 65% stenosis, left VA ends at PICA MRI right cerebellar infarct, and right lower pontine infarct. IR right VA origin, mid to basilar artery prominent stenosis, status post stenting in both arteries.  Severe 90% stenosis right ICA proximal, 50% stenosis left ICA proximal. MRI repeat 11/5 demonstrates increased size of right cerebellar infarct, new patchy right cerebellar infarcts in AICA territors, new infarcts in right middle cerebellar peduncle, right medulla, right and left pons, new punctate infarcts in left parietal and occipital lobes. 2D Echo EF 60 to 65% TEE performed 11/4, LVEF 60-65%No LA/LAAthrombus or mass.+PFO with R-->L shuntingAtherosclerosis of the descending aorta. Recommend 30 day cardiac event monitoring as outpt to rule out afib LDL 220 HgbA1c 5.5 SCDs for DVT prophylaxis No antithrombotic prior to admission, now on aspirin 81 mg daily, Brilinta (ticagrelor) 90 mg bid, Patient counseled to be compliant with his antithrombotic medications Ongoing aggressive stroke risk factor management Therapy recommendations: CIR Disposition: Pending  Carotid stenosis, bilateral CTA head and neck showed right ICA  65% stenosis Cerebral angiogram showed right ICA proximal 90% stenosis, left ICA proximal  50% stenosis Dr. Estanislado Pandy plan for right ICA stenting in the near future likely in 6 weeks after rehab stay.  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 Recheck BMP in am  Leukocytosis WBC 12.2-> 13.6-> 10.8->13.6.   - afebrile will monitor  I have personally obtained history,examined this patient, reviewed notes, independently viewed imaging studies, participated in medical decision making and plan of care.ROS completed by me personally and pertinent positives fully documented  I have made any additions or clarifications directly to the above note. Agree with note above.  Continue mobilization out of bed.  Therapy consults.  Transfer to neurology floor bed.  Check transcranial Doppler bubble study to characterize his PFO further.  Likely transfer to inpatient rehab in the next few days.  Continue aspirin and Brilinta and aggressive risk factor modification.  Long discussion with patient and wife at the bedside and answered questions.  Greater than 50% time during this 35-minute visit was spent on counseling and coordination of care and discussion about his stroke and recommended stenosis and stenting and answering questions  Antony Contras, MD Medical Director [place] Pager: [phone] 09/03/2021 4:45 PM  To contact Stroke Continuity provider, please refer to [email or web address] After hours, contact General Neurology

## 2021-09-03 NOTE — Progress Notes (Signed)
Occupational Therapy Treatment Patient Details Name: Maurice Little MRN: [id number] DOB: [birth year] Today's Date: 09/03/2021   History of present illness 65 y/o male presented to ED on 11/2 for R facial droop, R sided weakness, headache, and dizziness. 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 Plan for TEE 11/4. PMH: HTN, HLD, PVD   OT comments  Pt progressing toward goals this session with RW usage for transfer. Pt provided adjustment to tape on lens with decreased dizziness and diplopia lessen this session. Wife and pt educated on the goals/ objective of taping vs patching. Pt and wife agreeable to continued tape as patient able to open eyes more this session. Pt pleasant and happy to be OOB in chair. Recommendation for CIR.   Pt goes by "Maurice Little"    Recommendations for follow up therapy are one component of a multi-disciplinary discharge planning process, led by the attending physician.  Recommendations may be updated based on patient status, additional functional criteria and insurance authorization.    Follow Up Recommendations  Acute inpatient rehab (3hours/day)    Assistance Recommended at Discharge Frequent or constant Supervision/Assistance  Equipment Recommendations  BSC/3in1;Wheelchair (measurements OT);Wheelchair cushion (measurements OT)    Recommendations for Other Services Rehab consult    Precautions / Restrictions Precautions Precautions: Fall Precaution Comments: diplopia, Ataxic R; L side numb       Mobility Bed Mobility Overal bed mobility: Needs Assistance Bed Mobility: Supine to Sit     Supine to sit: Mod assist     General bed mobility comments: exiting on the right side of the bed. pt with ataxic movement    Transfers                         Balance Overall balance assessment: Needs  assistance Sitting-balance support: Bilateral upper extremity supported;Feet supported Sitting balance-Leahy Scale: Fair     Standing balance support: Bilateral upper extremity supported;During functional activity;Reliant on assistive device for balance Standing balance-Leahy Scale: Poor                             ADL either performed or assessed with clinical judgement   ADL Overall ADL's : Independent                     Lower Body Dressing: Maximal assistance Lower Body Dressing Details (indicate cue type and reason): don socks Toilet Transfer: +2 for physical assistance;Rolling walker (2 wheels);Moderate assistance Toilet Transfer Details (indicate cue type and reason): simulated EOB to chair. pt sit<>Stand from chair x3 during session. cues for anterior weight shift           General ADL Comments: pt progressed to static standing this session with RW. shoes helping with proceoption of feet to anterior weight shift. pt progressed from two person hand held (A) to static standing mod (A) one person RW this session.    Extremity/Trunk Assessment              Vision   Vision Assessment?: Yes Additional Comments: adding additional tape to widen the boundaries of L eye occlusion. pt with increased opening of bil eyes. pt reports decreased dizziness and reduction of diplopia.   Perception Perception Perception: Impaired   Praxis      Cognition Arousal/Alertness: Awake/alert Behavior During Therapy: Flat affect  Overall Cognitive Status: Impaired/Different from baseline Area of Impairment: Safety/judgement                         Safety/Judgement: Decreased awareness of safety Awareness: Emergent Problem Solving: Slow processing General Comments: pt following simple 1 step commands. pt able to provide some feedback about balance challenges this session.          Exercises Exercises: Other exercises Other Exercises Other Exercises:  gaze stabilization exercises. Other Exercises: using trash can to have patient toe tap for single leg balance challenges with RW. pt require anterior put at hips. pt needs cues to keep head in neutral. pt neck extension looking through bottom of glasses initially. pt reports vision remains blurry   Shoulder Instructions       General Comments VSS    Pertinent Vitals/ Pain       Pain Assessment: No/denies pain  Home Living                                          Prior Functioning/Environment              Frequency  Min 3X/week        Progress Toward Goals  OT Goals(current goals can now be found in the care plan section)  Progress towards OT goals: Progressing toward goals  Acute Rehab OT Goals Patient Stated Goal: to get to the beach -- wife [place] OT Goal Formulation: With patient/family Time For Goal Achievement: 09/14/21 Potential to Achieve Goals: Good ADL Goals Pt Will Perform Eating: with min assist Pt Will Perform Grooming: with min assist Pt Will Perform Upper Body Bathing: with min assist;bed level Pt Will Perform Lower Body Bathing: with mod assist;bed level Pt Will Transfer to Toilet: with mod assist;bedside commode;squat pivot transfer Additional ADL Goal #1: wife/pt will demonstrate  use of partial occlusion L lens to improve functional vision  Plan Discharge plan remains appropriate    Co-evaluation    PT/OT/SLP Co-Evaluation/Treatment: Yes Reason for Co-Treatment: Complexity of the patient's impairments (multi-system involvement);To address functional/ADL transfers;For patient/therapist safety   OT goals addressed during session: ADL's and self-care;Proper use of Adaptive equipment and DME;Strengthening/ROM      AM-PAC OT "6 Clicks" Daily Activity     Outcome Measure   Help from another person eating meals?: A Lot Help from another person taking care of personal grooming?: A Lot Help from  another person toileting, which includes using toliet, bedpan, or urinal?: A Lot Help from another person bathing (including washing, rinsing, drying)?: A Lot Help from another person to put on and taking off regular upper body clothing?: A Lot Help from another person to put on and taking off regular lower body clothing?: A Lot 6 Click Score: 12    End of Session Equipment Utilized During Treatment: Gait belt  OT Visit Diagnosis: Unsteadiness on feet (R26.81);Other abnormalities of gait and mobility (R26.89);Muscle weakness (generalized) (M62.81);Low vision, both eyes (H54.2);Other symptoms and signs involving the nervous system (R29.898);Other symptoms and signs involving cognitive function;Dizziness and giddiness (R42);Pain   Activity Tolerance Patient tolerated treatment well;Other (comment)   Patient Left in chair;with call bell/phone within reach;with chair alarm set;with family/visitor present   Nurse Communication Mobility status;Precautions        Time: 2751-7001 OT Time Calculation (min): 29 min  Charges: OT General  Charges $OT Visit: 1 Visit OT Treatments $Self Care/Home Management : 8-22 mins   Brynn, OTR/L  Acute Rehabilitation Services Pager: [phone] Office: [phone] .   Mateo Flow 09/03/2021, 12:16 PM

## 2021-09-03 NOTE — Progress Notes (Signed)
Patient transferred from ICU to 3W21. TELE applied. Safety precautions and orders reviewed with patient. VSS. Will continue to monitor,

## 2021-09-03 NOTE — Anesthesia Postprocedure Evaluation (Signed)
Anesthesia Post Note  Patient: Maurice Little  Procedure(s) Performed: TRANSESOPHAGEAL ECHOCARDIOGRAM (TEE) BUBBLE STUDY     Patient location during evaluation: PACU Anesthesia Type: MAC Level of consciousness: awake and alert Pain management: pain level controlled Vital Signs Assessment: post-procedure vital signs reviewed and stable Respiratory status: spontaneous breathing, nonlabored ventilation, respiratory function stable and patient connected to nasal cannula oxygen Cardiovascular status: stable and blood pressure returned to baseline Postop Assessment: no apparent nausea or vomiting Anesthetic complications: no   No notable events documented.  Last Vitals:  Vitals:   09/03/21 0600 09/03/21 0630  BP: (!) 170/106 (!) 145/106  Pulse: 81 85  Resp: 13 13  Temp:    SpO2: 97% 99%    Last Pain:  Vitals:   09/03/21 0630  TempSrc:   PainSc: 6                  Micha Erck L Annaleigh Steinmeyer

## 2021-09-04 ENCOUNTER — Encounter (HOSPITAL_COMMUNITY): Payer: Self-pay | Admitting: Student in an Organized Health Care Education/Training Program

## 2021-09-04 ENCOUNTER — Inpatient Hospital Stay (HOSPITAL_COMMUNITY)
Admission: RE | Admit: 2021-09-04 | Discharge: 2021-09-27 | DRG: 057 | Disposition: A | Discharge status: Home Health | Payer: 59 | Source: Intra-hospital | Attending: Physical Medicine & Rehabilitation | Admitting: Physical Medicine & Rehabilitation

## 2021-09-04 ENCOUNTER — Other Ambulatory Visit: Payer: Self-pay

## 2021-09-04 ENCOUNTER — Encounter (HOSPITAL_COMMUNITY): Payer: Self-pay | Admitting: Physical Medicine & Rehabilitation

## 2021-09-04 DIAGNOSIS — E876 Hypokalemia: Secondary | ICD-10-CM | POA: Diagnosis present

## 2021-09-04 DIAGNOSIS — K5901 Slow transit constipation: Secondary | ICD-10-CM

## 2021-09-04 DIAGNOSIS — R338 Other retention of urine: Secondary | ICD-10-CM | POA: Diagnosis present

## 2021-09-04 DIAGNOSIS — Z87891 Personal history of nicotine dependence: Secondary | ICD-10-CM | POA: Diagnosis not present

## 2021-09-04 DIAGNOSIS — D72829 Elevated white blood cell count, unspecified: Secondary | ICD-10-CM | POA: Diagnosis present

## 2021-09-04 DIAGNOSIS — N401 Enlarged prostate with lower urinary tract symptoms: Secondary | ICD-10-CM | POA: Diagnosis present

## 2021-09-04 DIAGNOSIS — I639 Cerebral infarction, unspecified: Secondary | ICD-10-CM | POA: Diagnosis present

## 2021-09-04 DIAGNOSIS — I69393 Ataxia following cerebral infarction: Secondary | ICD-10-CM | POA: Diagnosis not present

## 2021-09-04 DIAGNOSIS — R739 Hyperglycemia, unspecified: Secondary | ICD-10-CM | POA: Diagnosis present

## 2021-09-04 DIAGNOSIS — H532 Diplopia: Secondary | ICD-10-CM

## 2021-09-04 DIAGNOSIS — I69319 Unspecified symptoms and signs involving cognitive functions following cerebral infarction: Secondary | ICD-10-CM

## 2021-09-04 DIAGNOSIS — I951 Orthostatic hypotension: Secondary | ICD-10-CM | POA: Diagnosis not present

## 2021-09-04 DIAGNOSIS — I739 Peripheral vascular disease, unspecified: Secondary | ICD-10-CM | POA: Diagnosis present

## 2021-09-04 DIAGNOSIS — H9191 Unspecified hearing loss, right ear: Secondary | ICD-10-CM | POA: Diagnosis present

## 2021-09-04 DIAGNOSIS — I69392 Facial weakness following cerebral infarction: Secondary | ICD-10-CM | POA: Diagnosis not present

## 2021-09-04 DIAGNOSIS — H539 Unspecified visual disturbance: Secondary | ICD-10-CM | POA: Diagnosis present

## 2021-09-04 DIAGNOSIS — I10 Essential (primary) hypertension: Secondary | ICD-10-CM | POA: Diagnosis present

## 2021-09-04 DIAGNOSIS — R339 Retention of urine, unspecified: Secondary | ICD-10-CM | POA: Diagnosis not present

## 2021-09-04 DIAGNOSIS — I69322 Dysarthria following cerebral infarction: Secondary | ICD-10-CM | POA: Diagnosis not present

## 2021-09-04 DIAGNOSIS — I959 Hypotension, unspecified: Secondary | ICD-10-CM | POA: Diagnosis present

## 2021-09-04 DIAGNOSIS — F1210 Cannabis abuse, uncomplicated: Secondary | ICD-10-CM | POA: Diagnosis present

## 2021-09-04 DIAGNOSIS — I69351 Hemiplegia and hemiparesis following cerebral infarction affecting right dominant side: Principal | ICD-10-CM

## 2021-09-04 DIAGNOSIS — Z7982 Long term (current) use of aspirin: Secondary | ICD-10-CM

## 2021-09-04 DIAGNOSIS — N3289 Other specified disorders of bladder: Secondary | ICD-10-CM | POA: Diagnosis not present

## 2021-09-04 DIAGNOSIS — H5500 Unspecified nystagmus: Secondary | ICD-10-CM | POA: Diagnosis present

## 2021-09-04 DIAGNOSIS — I69398 Other sequelae of cerebral infarction: Secondary | ICD-10-CM

## 2021-09-04 DIAGNOSIS — Z79899 Other long term (current) drug therapy: Secondary | ICD-10-CM | POA: Diagnosis not present

## 2021-09-04 DIAGNOSIS — I672 Cerebral atherosclerosis: Secondary | ICD-10-CM | POA: Diagnosis present

## 2021-09-04 DIAGNOSIS — K5900 Constipation, unspecified: Secondary | ICD-10-CM | POA: Diagnosis present

## 2021-09-04 DIAGNOSIS — I6521 Occlusion and stenosis of right carotid artery: Secondary | ICD-10-CM

## 2021-09-04 DIAGNOSIS — D649 Anemia, unspecified: Secondary | ICD-10-CM

## 2021-09-04 DIAGNOSIS — E785 Hyperlipidemia, unspecified: Secondary | ICD-10-CM | POA: Diagnosis present

## 2021-09-04 HISTORY — DX: Benign prostatic hyperplasia without lower urinary tract symptoms: N40.0

## 2021-09-04 HISTORY — DX: Stricture of artery: I77.1

## 2021-09-04 LAB — GLUCOSE, CAPILLARY
Glucose-Capillary: 110 mg/dL — ABNORMAL HIGH (ref 70–99)
Glucose-Capillary: 113 mg/dL — ABNORMAL HIGH (ref 70–99)
Glucose-Capillary: 116 mg/dL — ABNORMAL HIGH (ref 70–99)
Glucose-Capillary: 128 mg/dL — ABNORMAL HIGH (ref 70–99)
Glucose-Capillary: 158 mg/dL — ABNORMAL HIGH (ref 70–99)

## 2021-09-04 LAB — CBC
HCT: 40.2 % (ref 39.0–52.0)
Hemoglobin: 13.4 g/dL (ref 13.0–17.0)
MCH: 31.7 pg (ref 26.0–34.0)
MCHC: 33.3 g/dL (ref 30.0–36.0)
MCV: 95 fL (ref 80.0–100.0)
Platelets: 270 10*3/uL (ref 150–400)
RBC: 4.23 MIL/uL (ref 4.22–5.81)
RDW: 13.9 % (ref 11.5–15.5)
WBC: 12 10*3/uL — ABNORMAL HIGH (ref 4.0–10.5)
nRBC: 0 % (ref 0.0–0.2)

## 2021-09-04 LAB — BASIC METABOLIC PANEL
Anion gap: 9 (ref 5–15)
BUN: 17 mg/dL (ref 8–23)
CO2: 22 mmol/L (ref 22–32)
Calcium: 8.8 mg/dL — ABNORMAL LOW (ref 8.9–10.3)
Chloride: 104 mmol/L (ref 98–111)
Creatinine, Ser: 0.78 mg/dL (ref 0.61–1.24)
GFR, Estimated: >60 mL/min (ref >60)
Glucose, Bld: 110 mg/dL — ABNORMAL HIGH (ref 70–99)
Potassium: 4 mmol/L (ref 3.5–5.1)
Sodium: 135 mmol/L (ref 135–145)

## 2021-09-04 MED ORDER — HYDRALAZINE HCL 25 MG PO TABS
25.0000 mg | ORAL_TABLET | Freq: Four times a day (QID) | ORAL | Status: DC
  Administered 2021-09-04 – 2021-09-06 (×8): 25 mg via ORAL
  Filled 2021-09-04 (×8): qty 1

## 2021-09-04 MED ORDER — LIDOCAINE HCL URETHRAL/MUCOSAL 2 % EX GEL
CUTANEOUS | Status: DC | PRN
  Administered 2021-09-13 – 2021-09-26 (×11): 6 via TOPICAL
  Filled 2021-09-04 (×17): qty 6
  Filled 2021-09-04: qty 12
  Filled 2021-09-04 (×20): qty 6

## 2021-09-04 MED ORDER — POLYETHYLENE GLYCOL 3350 17 G PO PACK: 17.0000 g | PACK | Freq: Every day | ORAL | Status: DC | PRN

## 2021-09-04 MED ORDER — PANTOPRAZOLE 2 MG/ML SUSPENSION
40.0000 mg | Freq: Every day | ORAL | Status: DC
  Administered 2021-09-05 – 2021-09-07 (×3): 40 mg via ORAL
  Filled 2021-09-04 (×3): qty 20

## 2021-09-04 MED ORDER — TICAGRELOR 90 MG PO TABS: 90.0000 mg | ORAL_TABLET | Freq: Two times a day (BID) | ORAL | Status: DC

## 2021-09-04 MED ORDER — TRAZODONE HCL 50 MG PO TABS
25.0000 mg | ORAL_TABLET | Freq: Every evening | ORAL | Status: DC | PRN
Start: 2021-09-04 — End: 2021-09-27
  Administered 2021-09-07 – 2021-09-08 (×2): 50 mg via ORAL
  Filled 2021-09-04 (×4): qty 1

## 2021-09-04 MED ORDER — PROCHLORPERAZINE 25 MG RE SUPP: 12.5000 mg | Freq: Four times a day (QID) | RECTAL | Status: DC | PRN

## 2021-09-04 MED ORDER — FLEET ENEMA 7-19 GM/118ML RE ENEM: 1.0000 | ENEMA | Freq: Once | RECTAL | Status: DC | PRN

## 2021-09-04 MED ORDER — ASPIRIN 81 MG PO CHEW
81.0000 mg | CHEWABLE_TABLET | Freq: Every day | ORAL | Status: DC
  Administered 2021-09-05 – 2021-09-27 (×23): 81 mg via ORAL
  Filled 2021-09-04 (×23): qty 1

## 2021-09-04 MED ORDER — SENNOSIDES-DOCUSATE SODIUM 8.6-50 MG PO TABS
1.0000 | ORAL_TABLET | Freq: Every evening | ORAL | Status: DC | PRN
  Administered 2021-09-08: 1 via ORAL
  Filled 2021-09-04: qty 1

## 2021-09-04 MED ORDER — PANTOPRAZOLE 2 MG/ML SUSPENSION: 40.0000 mg | Freq: Every day | ORAL | Status: DC

## 2021-09-04 MED ORDER — SENNOSIDES-DOCUSATE SODIUM 8.6-50 MG PO TABS
2.0000 | ORAL_TABLET | Freq: Every day | ORAL | Status: DC
  Administered 2021-09-04 – 2021-09-12 (×9): 2 via ORAL
  Filled 2021-09-04 (×9): qty 2

## 2021-09-04 MED ORDER — LOSARTAN POTASSIUM 50 MG PO TABS
50.0000 mg | ORAL_TABLET | Freq: Two times a day (BID) | ORAL | Status: DC
  Administered 2021-09-04: 50 mg via ORAL
  Filled 2021-09-04 (×2): qty 1

## 2021-09-04 MED ORDER — ASPIRIN 81 MG PO CHEW: 81.0000 mg | CHEWABLE_TABLET | Freq: Every day | ORAL | Status: DC

## 2021-09-04 MED ORDER — ACETAMINOPHEN 325 MG PO TABS
325.0000 mg | ORAL_TABLET | ORAL | Status: DC | PRN
  Administered 2021-09-04 – 2021-09-20 (×12): 650 mg via ORAL
  Filled 2021-09-04 (×14): qty 2

## 2021-09-04 MED ORDER — MAGNESIUM HYDROXIDE 400 MG/5ML PO SUSP
45.0000 mL | Freq: Once | ORAL | Status: AC
  Administered 2021-09-04: 45 mL via ORAL
  Filled 2021-09-04: qty 60

## 2021-09-04 MED ORDER — DIPHENHYDRAMINE HCL 12.5 MG/5ML PO ELIX: 12.5000 mg | ORAL_SOLUTION | Freq: Four times a day (QID) | ORAL | Status: DC | PRN

## 2021-09-04 MED ORDER — ENOXAPARIN SODIUM 40 MG/0.4ML IJ SOSY: 40.0000 mg | PREFILLED_SYRINGE | INTRAMUSCULAR | Status: DC

## 2021-09-04 MED ORDER — ATORVASTATIN CALCIUM 80 MG PO TABS
80.0000 mg | ORAL_TABLET | Freq: Every day | ORAL | Status: DC
  Administered 2021-09-04 – 2021-09-26 (×23): 80 mg via ORAL
  Filled 2021-09-04 (×23): qty 1

## 2021-09-04 MED ORDER — BISACODYL 10 MG RE SUPP
10.0000 mg | Freq: Every day | RECTAL | Status: DC | PRN
  Filled 2021-09-04: qty 1

## 2021-09-04 MED ORDER — TICAGRELOR 90 MG PO TABS
90.0000 mg | ORAL_TABLET | Freq: Two times a day (BID) | ORAL | Status: DC
  Administered 2021-09-04 – 2021-09-27 (×46): 90 mg via ORAL
  Filled 2021-09-04 (×46): qty 1

## 2021-09-04 MED ORDER — AMLODIPINE BESYLATE 10 MG PO TABS
10.0000 mg | ORAL_TABLET | Freq: Every day | ORAL | Status: DC
  Filled 2021-09-04: qty 1

## 2021-09-04 MED ORDER — TAMSULOSIN HCL 0.4 MG PO CAPS: 0.4000 mg | ORAL_CAPSULE | Freq: Every day | ORAL | Status: DC

## 2021-09-04 MED ORDER — PROCHLORPERAZINE EDISYLATE 10 MG/2ML IJ SOLN: 5.0000 mg | Freq: Four times a day (QID) | INTRAMUSCULAR | Status: DC | PRN

## 2021-09-04 MED ORDER — ALUM & MAG HYDROXIDE-SIMETH 200-200-20 MG/5ML PO SUSP: 30.0000 mL | ORAL | Status: DC | PRN

## 2021-09-04 MED ORDER — PROCHLORPERAZINE MALEATE 5 MG PO TABS: 5.0000 mg | ORAL_TABLET | Freq: Four times a day (QID) | ORAL | Status: DC | PRN

## 2021-09-04 MED ORDER — GUAIFENESIN-DM 100-10 MG/5ML PO SYRP: 5.0000 mL | ORAL_SOLUTION | Freq: Four times a day (QID) | ORAL | Status: DC | PRN

## 2021-09-04 MED ORDER — ENOXAPARIN SODIUM 40 MG/0.4ML IJ SOSY
40.0000 mg | PREFILLED_SYRINGE | INTRAMUSCULAR | Status: DC
  Administered 2021-09-05 – 2021-09-27 (×23): 40 mg via SUBCUTANEOUS
  Filled 2021-09-04 (×23): qty 0.4

## 2021-09-04 NOTE — Progress Notes (Signed)
Inpatient Rehabilitation Admission Medication Review by a Pharmacist  A complete drug regimen review was completed for this patient to identify any potential clinically significant medication issues.  High Risk Drug Classes Is patient taking? Indication by Medication  Antipsychotic Yes Compazine for N/V  Anticoagulant Yes Lovenox for VTE ppx  Antibiotic No   Opioid No   Antiplatelet Yes Aspirin and Brilinta for CVA  Hypoglycemics/insulin No   Vasoactive Medication Yes Amlodipine, hydralazine, losartan for BP  Chemotherapy No   Other No      Type of Medication Issue Identified Description of Issue Recommendation(s)  Drug Interaction(s) (clinically significant)     Duplicate Therapy     Allergy     No Medication Administration End Date     Incorrect Dose     Additional Drug Therapy Needed     Significant med changes from prior encounter (inform family/care partners about these prior to discharge).    Other       Clinically significant medication issues were identified that warrant physician communication and completion of prescribed/recommended actions by midnight of the next day:  No   Pharmacist comments: None  Time spent performing this drug regimen review (minutes):  20 minutes   Elwin Sleight 09/04/2021 3:13 PM

## 2021-09-04 NOTE — Progress Notes (Signed)
PMR Admission Coordinator Pre-Admission Assessment   Patient: Maurice Little is an 65 y.o., male MRN: [id number] DOB: Nov 14, 1955 Height: 6' (182.9 cm) Weight: 92 kg   Insurance Information HMO:     PPO:      PCP:      IPA:      80/20:     OTHER:  PRIMARY: [employer] other      Policy#: [id number]      Subscriber: patient CM Name: Christianne Borrow      Phone#: [phone] ext. [phone]     Fax#: [phone] Pre-Cert#: [id number]  Received approval for 6 days on 09/03/21. Updates due on day 6.    Employer:  Benefits:  Phone #: [email or web address]     Name:  Eff. Date: 10/28/20-10/27/21     Deduct: $500 ($0 met)      Out of Pocket Max: $3,000 ($30 met)      Life Max: NA CIR: 90% coverage, 10% co-insurance      SNF: 90% coverage, 10% co-insurance Outpatient: $30 co-pay     Co-Pay:  Home Health: 90% coverage, 10% co-insurance      Co-Pay:  DME: 90% coverage, 10% co-insurance     Co-Pay:  Providers: in-network SECONDARY:       Policy#:      Phone#:    Development worker, community:       Phone#:    The Engineer, petroleum" for patients in Inpatient Rehabilitation Facilities with attached "Privacy Act [place] Records" was provided and verbally reviewed with: Patient   Emergency Contact Information Contact Information       Name Relation Home Work Mobile    Shook,jodie Spouse     [phone]    Maxie,Uda Mother     [phone]           Current Medical History  Patient Admitting Diagnosis: right cerebral infarcts History of Present Illness: Pt is a 65 year old male with medical hx significant for: HTN, HLD, PVD. Pt presented to hospital as code stroke on 08/29/21. Pt was c/o dizziness, headache, nausea and vomiting, left-sided numbness, diplopia. Later developed right facial droop, right hearing loss and right-sided weakness. CTH revealed subacute right cerebellar infarcts. 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 MRI  confirmed right cerebellar infarct and right lower pontine infarct. 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 TEE on 11/4 showed EF of 60-65%, no LA/LAA thrombus or mass,small PFO, atherosclerosis of the descending aorta. Repeat MRI on 11/5 showed increased size of right cerebellar infarct and new infarcts in left parietal and occipital lobes. Therapy evaluations completed and CIR recommended d/t pt's deficits in functional mobility and inability to perform ADLs independently.  Complete NIHSS TOTAL: 8   Patient's medical record from [facility] has been reviewed by the rehabilitation admission coordinator and physician.   Past Medical History      Past Medical History:  Diagnosis Date   Hypertension     PAD (peripheral artery disease) ([place])        Has the patient had major surgery during 100 days prior to admission? Yes   Family History   family history is not on file.   Current Medications   Current Facility-Administered Medications:    acetaminophen (TYLENOL) tablet 650 mg, 650 mg, Oral, Q4H PRN, 650 mg at 09/03/21 0632 **OR** acetaminophen (TYLENOL) 160 MG/5ML solution 650 mg, 650 mg, Per Tube, Q4H  PRN **OR** acetaminophen (TYLENOL) suppository 650 mg, 650 mg, Rectal, Q4H PRN, Skeet Latch, MD   amLODipine (NORVASC) tablet 10 mg, 10 mg, Oral, Daily, Rosalin Hawking, MD, 10 mg at 09/03/21 0931   aspirin chewable tablet 81 mg, 81 mg, Oral, Daily, 81 mg at 09/03/21 0930 **OR** aspirin chewable tablet 81 mg, 81 mg, Per Tube, Daily, Skeet Latch, MD   atorvastatin (LIPITOR) tablet 80 mg, 80 mg, Oral, QHS, Skeet Latch, MD, 80 mg at 09/02/21 2110   Chlorhexidine Gluconate Cloth 2 % PADS 6 each, 6 each, Topical, Daily, Skeet Latch, MD, 6 each at 09/02/21 1303   clevidipine (CLEVIPREX) infusion 0.5 mg/mL, 0-21 mg/hr, Intravenous, Continuous, Rosalin Hawking, MD,  Stopped at 09/01/21 1120   hydrALAZINE (APRESOLINE) injection 10 mg, 10 mg, Intravenous, Q1H PRN, Beulah Gandy A, NP, 10 mg at 09/02/21 0853   hydrALAZINE (APRESOLINE) tablet 25 mg, 25 mg, Oral, Q6H, Wolfe, Denise A, NP, 25 mg at 09/03/21 1246   influenza vac split quadrivalent PF (FLUARIX) injection 0.5 mL, 0.5 mL, Intramuscular, Tomorrow-1000, Amie Portland, MD   insulin aspart (novoLOG) injection 2-6 Units, 2-6 Units, Subcutaneous, Q4H, Skeet Latch, MD, 2 Units at 09/02/21 1314   labetalol (NORMODYNE) injection 10 mg, 10 mg, Intravenous, Q2H PRN, Skeet Latch, MD, 10 mg at 09/02/21 4332   losartan (COZAAR) tablet 50 mg, 50 mg, Oral, BID, Rosalin Hawking, MD, 50 mg at 09/03/21 0931   ondansetron (ZOFRAN) injection 4 mg, 4 mg, Intravenous, Q6H PRN, Skeet Latch, MD, 4 mg at 08/31/21 2132   potassium chloride SA (KLOR-CON) CR tablet 40 mEq, 40 mEq, Oral, Once, Beulah Gandy A, NP   senna-docusate (Senokot-S) tablet 1 tablet, 1 tablet, Oral, QHS PRN, Skeet Latch, MD, 1 tablet at 09/02/21 2110   tamsulosin (FLOMAX) capsule 0.4 mg, 0.4 mg, Oral, Daily, de [place], [place] E, NP, 0.4 mg at 09/03/21 0932   ticagrelor (BRILINTA) tablet 90 mg, 90 mg, Oral, BID, 90 mg at 09/03/21 0931 **OR** ticagrelor (BRILINTA) tablet 90 mg, 90 mg, Per Tube, BID, Skeet Latch, MD   Patients Current Diet:  Diet Order                  DIET DYS 3 Room service appropriate? Yes; Fluid consistency: Thin  Diet effective now                         Precautions / Restrictions Precautions Precautions: Fall Precaution Comments: diplopia, Ataxic R; L side numbness Restrictions Weight Bearing Restrictions: No    Has the patient had 2 or more falls or a fall with injury in the past year? No   Prior Activity Level Limited Community (1-2x/wk): drives, works from home   Prior Functional Level Self Care: Did the patient need help bathing, dressing, using the toilet or eating? Independent    Indoor Mobility: Did the patient need assistance with walking from room to room (with or without device)? Independent   Stairs: Did the patient need assistance with internal or external stairs (with or without device)? Independent   Functional Cognition: Did the patient need help planning regular tasks such as shopping or remembering to take medications? Independent   Patient Information Are you of Hispanic, Latino/a,or Spanish origin?: X. Patient unable to respond, A. No, not of Hispanic, Latino/a, or Spanish origin What is your race?: X. Patient unable to respond, A. White Do you need or want an interpreter to communicate with a doctor or health care staff?: 9.  Unable to respond   Patient's Response To:  Health Literacy and Transportation Is the patient able to respond to health literacy and transportation needs?: No Health Literacy - How often do you need to have someone help you when you read instructions, pamphlets, or other written material from your doctor or pharmacy?: Patient unable to respond   [place] Devices/Equipment: Eyeglasses Home Equipment: Shower seat - built in, Hand held shower head   Prior Device Use: Indicate devices/aids used by the patient prior to current illness, exacerbation or injury? None of the above   Current Functional Level Cognition   Overall Cognitive Status: Impaired/Different from baseline Current Attention Level: Selective Orientation Level: Oriented X4 Following Commands: Follows one step commands with increased time, Follows one step commands consistently Safety/Judgement: Decreased awareness of safety General Comments: follows one step commands well and consistently, very motivated but aware of deficits and need for rest breaks    Extremity Assessment (includes Sensation/Coordination)   Upper Extremity Assessment: RUE deficits/detail, LUE deficits/detail RUE Deficits / Details: ROM adn strength WFL;  apparent sensory motor impariment leading to ataxic movements RUE Coordination: decreased fine motor, decreased gross motor LUE Deficits / Details: ROM most likely limited by inability to feel; Strength  - weaker proximally but funcitonal; overall 4/5 throughout LUE Sensation: decreased light touch, decreased proprioception LUE Coordination: decreased fine motor, decreased gross motor  Lower Extremity Assessment: Defer to PT evaluation RLE Deficits / Details: strength WFL, ataxia noted RLE Sensation: WNL RLE Coordination: decreased fine motor, decreased gross motor LLE Deficits / Details: grossly 4/5 LLE Sensation: decreased light touch, decreased proprioception LLE Coordination: decreased fine motor, decreased gross motor     ADLs   Overall ADL's : Independent Eating/Feeding: NPO Grooming: Maximal assistance Grooming Details (indicate cue type and reason): brushing teeth with toothette and suction; difficulty controlling movement patterns Upper Body Bathing: Maximal assistance Lower Body Bathing: Maximal assistance, Sit to/from stand Upper Body Dressing : Maximal assistance, Total assistance Lower Body Dressing: Maximal assistance Lower Body Dressing Details (indicate cue type and reason): don socks Toilet Transfer: +2 for physical assistance, Rolling walker (2 wheels), Moderate assistance Toilet Transfer Details (indicate cue type and reason): simulated EOB to chair. pt sit<>Stand from chair x3 during session. cues for anterior weight shift Toileting- Clothing Manipulation and Hygiene: Total assistance Functional mobility during ADLs: Maximal assistance, +2 for physical assistance General ADL Comments: pt progressed to static standing this session with RW. shoes helping with proceoption of feet to anterior weight shift. pt progressed from two person hand held (A) to static standing mod (A) one person RW this session.     Mobility   Overal bed mobility: Needs Assistance Bed Mobility:  Supine to Sit Supine to sit: Mod assist General bed mobility comments: assist for progression LEs to EOB, trunk elevation.     Transfers   Overall transfer level: Needs assistance Equipment used: 2 person hand held assist, Rolling walker (2 wheels) Transfers: Sit to/from Stand Sit to Stand: Mod assist, +2 physical assistance, Min assist, From elevated surface Stand pivot transfers: Total assist Transfer via Lift Equipment: Stedy General transfer comment: Initially mod +2 for power up, rise, steadying, max cues for hand placement when rising, "nose over toes" when standing up to prevent excessive truncal extension. Transitioning to min +2 with continued, repeated practice. STS x4, x1 from EOB and x3 from recliner. RW placed in front of pt for last 2 stands to promote slight hip flexion.  Ambulation / Gait / Stairs / Wheelchair Mobility   Ambulation/Gait Ambulation/Gait assistance: Mod assist, +2 physical assistance Gait Distance (Feet): 5 Feet Assistive device: 2 person hand held assist Gait Pattern/deviations: Step-through pattern, Decreased stride length, Ataxic, Drifts right/left, Wide base of support, Leaning posteriorly General Gait Details: mod +2 for steadying, facilitating slight hip flexion at ASIS on R to prevent truncal hyperextension. Gait velocity: decr Pre-gait activities: worked on erect posture and wt shifting to maintain midline     Posture / Balance Dynamic Sitting Balance Sitting balance - Comments: pt able to maintain static sitting with close supervision, loses balance to R when dynamic Balance Overall balance assessment: Needs assistance Sitting-balance support: Bilateral upper extremity supported, Feet supported Sitting balance-Leahy Scale: Fair Sitting balance - Comments: pt able to maintain static sitting with close supervision, loses balance to R when dynamic Postural control: Right lateral lean Standing balance support: Bilateral upper extremity supported,  During functional activity, Reliant on assistive device for balance Standing balance-Leahy Scale: Poor Standing balance comment: reliant on external assist to maintain standing balance     Special needs/care consideration Skin Ecchymosis: wrist/left and Diabetic management novoLOG 2-6 units every 4 hours, Urethral catheter    Previous Home Environment (from acute therapy documentation) Living Arrangements: Spouse/significant other  Lives With: Spouse Available Help at Discharge: Family, Available 24 hours/day Type of Home: House Home Layout: One level Home Access: Stairs to enter Entrance Stairs-Rails: Right, Left Entrance Stairs-Number of Steps: 3 Bathroom Shower/Tub: Multimedia programmer: Standard Bathroom Accessibility: Yes How Accessible: Accessible via walker Home Care Services: No   Discharge Living Setting Plans for Discharge Living Setting: Patient's home Type of Home at Discharge: House Discharge Home Layout: One level Discharge Home Access: Stairs to enter Entrance Stairs-Rails: Right, Left Entrance Stairs-Number of Steps: 3 Discharge Bathroom Shower/Tub: Walk-in shower Discharge Bathroom Toilet: Standard Discharge Bathroom Accessibility: Yes How Accessible: Accessible via walker Does the patient have any problems obtaining your medications?: No   Social/Family/Support Systems Anticipated Caregiver: Tyland Klemens, wife Anticipated Caregiver's Contact Information: [phone] Caregiver Availability: 24/7 Discharge Plan Discussed with Primary Caregiver: Yes Is Caregiver In Agreement with Plan?: Yes Does Caregiver/Family have Issues with Lodging/Transportation while Pt is in Rehab?: No   Goals Patient/Family Goal for Rehab: Min A: PT/OT, Mod I: ST Expected length of stay: 16-18 days days Pt/Family Agrees to Admission and willing to participate: Yes Program Orientation Provided & Reviewed with Pt/Caregiver Including Roles  & Responsibilities: Yes    Decrease burden of Care through IP rehab admission: NA   Possible need for SNF placement upon discharge: Not anticipated   Patient Condition: I have reviewed medical records from [facility], spoken with CM, and patient and spouse. I met with patient at the bedside and discussed via phone for inpatient rehabilitation assessment.  Patient will benefit from ongoing PT, OT, and SLP, can actively participate in 3 hours of therapy a day 5 days of the week, and can make measurable gains during the admission.  Patient will also benefit from the coordinated team approach during an Inpatient Acute Rehabilitation admission.  The patient will receive intensive therapy as well as Rehabilitation physician, nursing, social worker, and care management interventions.  Due to bladder management, safety, skin/wound care, disease management, medication administration, pain management, and patient education the patient requires 24 hour a day rehabilitation nursing.  The patient is currently Mod A+2 with mobility and Max A with basic ADLs.  Discharge setting and therapy post discharge at home with home  health is anticipated.  Patient has agreed to participate in the Acute Inpatient Rehabilitation Program and will admit today.   Preadmission Screen Completed By:  Bethel Born, 09/03/2021 4:34 PM ______________________________________________________________________   Discussed status with Dr. Dagoberto Ligas  on 09/04/21 at 58 and received approval for admission today.   Admission Coordinator:  Bethel Born, CCC-SLP, time 1130/Date 09/04/21    Assessment/Plan: Diagnosis: Does the need for close, 24 hr/day Medical supervision in concert with the patient's rehab needs make it unreasonable for this patient to be served in a less intensive setting? Yes Co-Morbidities requiring supervision/potential complications: R hemiparesis; basilar thrombus; diplopia; N?V, dizziness, HA; HTN, HLD; PVD Due to bladder  management, bowel management, safety, skin/wound care, disease management, medication administration, pain management, and patient education, does the patient require 24 hr/day rehab nursing? Yes Does the patient require coordinated care of a physician, rehab nurse, PT, OT, and SLP to address physical and functional deficits in the context of the above medical diagnosis(es)? Yes Addressing deficits in the following areas: balance, endurance, locomotion, strength, transferring, bowel/bladder control, bathing, dressing, feeding, grooming, toileting, cognition, speech, and swallowing Can the patient actively participate in an intensive therapy program of at least 3 hrs of therapy 5 days a week? Yes The potential for patient to make measurable gains while on inpatient rehab is good Anticipated functional outcomes upon discharge from inpatient rehab: modified independent and supervision PT, modified independent and supervision OT, n/a SLP Estimated rehab length of stay to reach the above functional goals is: 16-=18 days Anticipated discharge destination: Home 10. Overall Rehab/Functional Prognosis: good

## 2021-09-04 NOTE — Progress Notes (Signed)
Speech Language Pathology Treatment: Dysphagia;Cognitive-Linquistic  Patient Details Name: Maurice Little MRN: [id number] DOB: [birth year] Today's Date: 09/04/2021 Time: 9924-2683 SLP Time Calculation (min) (ACUTE ONLY): 29 min  Assessment / Plan / Recommendation Clinical Impression  Pt seen, with wife at bedside, for skilled dysphagia and dysarthria treatment. Pt provided upgraded trials of regular textured solids and thin liquids, demonstrating no overt s/sx of aspiration. He independently identified oral residuals and utilized lingual sweep to clear oral cavity completely. Given awareness and independence with strategies, suspect pt would do well with a regular, thin liquid diet. Recommend SLP to f/u for tolerance either acutely or at next level of care. Educated/trained pt and wife in use of slow rate of speech and increase of vocal intensity in order to improve speech intelligibility. When provided a structured task, pt produced sentences with 90-100% intelligibility with min-mod verbal/demonstration cues for strategies. Pt would continue to benefit from skilled SLP services to address motor speech deficits.    HPI HPI: Maurice Little presented as code stroke via EMS after he experienced left sided tingling prior to arrival.  His troubles began between 10/31-11/1, when he noted sudden onset dizziness and headache. He laid on the ground and these symptoms eventually subsided over the course of an hour, although they returned AM of admission day.  2 hours prior to arrival, he had been having a bowel movement when he suddenly felt left sided tingling "like neuropathy" of left face, arm, leg. On admission he reported it had been constant since onset without change in character. No weakness.   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 Small additional involvement of right brachium pontis and right pontomedullary junction. No hemorrhage or significant  mass effect.  Mild chronic microvascular ischemic changes.  Patient reported he was wokring fulltime prior to this event.      SLP Plan  Continue with current plan of care      Recommendations for follow up therapy are one component of a multi-disciplinary discharge planning process, led by the attending physician.  Recommendations may be updated based on patient status, additional functional criteria and insurance authorization.    Recommendations  Diet recommendations: Regular;Thin liquid Liquids provided via: Cup;Straw Medication Administration: Whole meds with liquid Supervision: Staff to assist with self feeding;Full supervision/cueing for compensatory strategies Compensations: Minimize environmental distractions;Slow rate;Small sips/bites;Lingual sweep for clearance of pocketing Postural Changes and/or Swallow Maneuvers: Seated upright 90 degrees                Oral Care Recommendations: Oral care BID Follow Up Recommendations: Acute inpatient rehab (3hours/day) Assistance recommended at discharge: Intermittent Supervision/Assistance SLP Visit Diagnosis: Dysphagia, oral phase (R13.11);Dysarthria and anarthria (R47.1) Plan: Continue with current plan of care       GO              Avie Echevaria, MA, CCC-SLP Acute Rehabilitation Services Office Number: [phone]   Paulette Blanch  09/04/2021, 10:32 AM

## 2021-09-04 NOTE — H&P (Signed)
Physical Medicine and Rehabilitation Admission H&P        Chief Complaint  Patient presents with   Stroke with functional deficits.       HPI: Maurice Little is a 65 year old RH-male with history of HTN, PAD who was admitted on 08/29/21 with onset of dizziness and headaches approximately day prior to admission followed by left-sided tingling for 2 hours the next day.  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  CTA head/neck showed atherosclerosis with question of high-grade stenosis or nonocclusive clot in basilar artery.  He was loaded with DAPT and started on IV heparin.  He developed right facial droop with right hearing loss, diplopia with nystagmus as well as slurred speech with nausea evening past admission.  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 Corliss Skains.  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  Postprocedure to be on aspirin and Brilinta for at least a year.   BLE Dopplers were negative for DVT.  Cardiology was consulted for input and he underwent TEE revealing LVEF 60-65%, no LAA thrombus, PFO + R-->L shunt and atherosclerosis of the descending aorta.  MRI brain done revealing acute infarct in right cerebellar hemisphere and small additional infarcts in right brachium pontis and right pontomedullary junction.  Dr. Pearlean Brownie felt that stroke was most likely secondary to atherosclerosis with R-severe occlusion and PA high-grade stenosis.  Recommendations are for 30-day cardiac event monitor on outpatient basis to rule out A. fib.  Dr. Corliss Skains plans right ICA stenting in approximately 6 weeks.  MBS done showing mild oral dysphagia and dysphagia 3 with thin liquids recommended.  He has also had issues with urinary retention requiring placement of Foley on 11/06.  Patient continues to be limited by balance deficits with RLE incoordination,  left-sided numbness, diplopia as well as orthostatic changes today with BP 76/56 after standing.  Therapy ongoing and CIR recommended due to functional decline.   Cannot feel on L side- but has good strength.  Also has diplopia-  Foley was placed yesterday due to urinary retention.    Also has dysarthria.  NO BM since last Wednesday- feels constipation.        Review of Systems  Constitutional:  Negative for chills and fever.  HENT:  Positive for hearing loss (on the right). Negative for tinnitus.   Eyes:  Positive for double vision. Negative for pain.  Respiratory:  Negative for cough and shortness of breath.   Cardiovascular:  Negative for chest pain and palpitations.  Gastrointestinal:  Positive for constipation (has not had BM since admission). Negative for abdominal pain and heartburn.  Musculoskeletal:  Negative for myalgias.  Skin:  Negative for rash.  Neurological:  Positive for dizziness, sensory change, speech change, focal weakness and weakness. Negative for headaches.  Psychiatric/Behavioral:  Negative for hallucinations.   All other systems reviewed and are negative.         Past Medical History:  Diagnosis Date   Hypertension     PAD (peripheral artery disease) (HCC)             Past Surgical History:  Procedure Laterality Date   BUBBLE STUDY   08/31/2021    Procedure: BUBBLE STUDY;  Surgeon: Chilton Si, MD;  Location: [facility] ENDOSCOPY;  Service: Cardiovascular;;   IR ANGIO VERTEBRAL SEL SUBCLAVIAN INNOMINATE UNI L MOD SED   08/30/2021  IR ANGIO VERTEBRAL SEL SUBCLAVIAN INNOMINATE UNI R MOD SED   08/30/2021   IR CT HEAD LTD   08/30/2021   IR INTRA CRAN STENT   08/30/2021   IR US GUIDE VASC ACCESS RIGHT   08/30/2021   RADIOLOGY WITH ANESTHESIA N/A 08/30/2021    Procedure: IR WITH ANESTHESIA;  Surgeon: Julieanne Cotton, MD;  Location: [facility] OR;  Service: Radiology;  Laterality: N/A;   TEE WITHOUT CARDIOVERSION N/A 08/31/2021    Procedure: TRANSESOPHAGEAL  ECHOCARDIOGRAM (TEE);  Surgeon: Chilton Si, MD;  Location: [facility] ENDOSCOPY;  Service: Cardiovascular;  Laterality: N/A;           Family History  Problem Relation Age of Onset   Healthy Mother        Social History: Married. Works in Consulting civil engineer for [employer] and recently moved from [place]. Per  reports that he has quit smoking. His smoking use included cigarettes. He has never used smokeless tobacco. He reports that he does not currently use alcohol. No history on file for drug use.          Allergies  Allergen Reactions   Duloxetine Tinitus      Other reaction(s): Other (See Comments) Dizziness Dizziness, tinnitus              Medications Prior to Admission  Medication Sig Dispense Refill   Acetylcysteine (NAC PO) Take 1 tablet by mouth daily.       aspirin EC 81 MG tablet Take 81 mg by mouth daily. Swallow whole.       Cyanocobalamin (B-12 PO) Take 1 tablet by mouth daily.       ibuprofen (ADVIL) 200 MG tablet Take 400 mg by mouth every 6 (six) hours as needed for fever, headache or mild pain.       OVER THE COUNTER MEDICATION Take 1 tablet by mouth daily. Delta 3 gummie          Drug Regimen Review  Drug regimen was reviewed and remains appropriate with no significant issues identified   Home: Home Living Family/patient expects to be discharged to:: Private residence Living Arrangements: Spouse/significant other Available Help at Discharge: Family, Available 24 hours/day Type of Home: House Home Access: Stairs to enter [employer] of Steps: 3 Entrance Stairs-Rails: Right, Left Home Layout: One level Bathroom Shower/Tub: Health visitor: Administrator Accessibility: Yes Home Equipment: Shower seat - built in, Higher education careers adviser held shower head  Lives With: Spouse   Functional History: Prior Function Prior Level of Function : Independent/Modified Independent, Working/employed, 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, early years/pre) ADLs Comments: 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, maintenance Status:  Mobility: Bed Mobility Overal bed mobility: Needs Assistance Bed Mobility: Supine to Sit, Sit to Supine Supine to sit: Min assist Sit to supine: Min assist General bed mobility comments: Up to minA for BLE management Transfers Overall transfer level: Needs assistance Equipment used: Rolling walker (2 wheels) Transfers: Sit to/from Stand Sit to Stand: Mod assist, +2 physical assistance Stand pivot transfers: Total assist Transfer via Lift Equipment: Stedy General transfer comment: up to modA +2 for power up, steadying, and max cues for hand placement Ambulation/Gait Ambulation/Gait assistance: Mod assist, +2 physical assistance Gait Distance (Feet): 5 Feet Assistive device: 2 person hand held assist Gait Pattern/deviations: Step-through pattern, Decreased stride length, Ataxic, Drifts right/left, Wide base of support, Leaning posteriorly General Gait Details: Deferred today due to orthostatic vitals upon standing Gait velocity: decr Pre-gait activities: worked on erect posture and wt shifting  to maintain midline   ADL: ADL Overall ADL's : Independent Eating/Feeding: NPO Grooming: Maximal assistance Grooming Details (indicate cue type and reason): brushing teeth with toothette and suction; difficulty controlling movement patterns Upper Body Bathing: Maximal assistance Lower Body Bathing: Maximal assistance, Sit to/from stand Upper Body Dressing : Maximal assistance, Total assistance Lower Body Dressing: Maximal assistance Lower Body Dressing Details (indicate cue type and reason): don socks Toilet Transfer: +2 for physical assistance, Rolling walker (2 wheels), Moderate assistance Toilet Transfer Details (indicate cue type and reason): simulated EOB to chair. pt sit<>Stand from chair x3 during session. cues for anterior weight shift Toileting- Clothing Manipulation and Hygiene: Total assistance Functional mobility during ADLs: Maximal  assistance, +2 for physical assistance General ADL Comments: pt progressed to static standing this session with RW. shoes helping with proceoption of feet to anterior weight shift. pt progressed from two person hand held (A) to static standing mod (A) one person RW this session.   Cognition: Cognition Overall Cognitive Status: Impaired/Different from baseline Orientation Level: Oriented X4 Cognition Arousal/Alertness: Awake/alert Behavior During Therapy: Flat affect Overall Cognitive Status: Impaired/Different from baseline Area of Impairment: Safety/judgement, Memory Current Attention Level: Selective Memory: Decreased short-term memory, Decreased recall of precautions Following Commands: Follows one step commands with increased time, Follows one step commands inconsistently Safety/Judgement: Decreased awareness of safety Awareness: Emergent Problem Solving: Slow processing, Requires verbal cues, Requires tactile cues General Comments: Pt is aware of deficits and need for rest breaks     Blood pressure 117/74, pulse 85, temperature 98.4 F (36.9 C), temperature source Oral, resp. rate 16, height 6' (1.829 m), weight 92 kg, SpO2 97 %. Physical Exam Vitals and nursing note reviewed. Exam conducted with a chaperone present.  Constitutional:      Appearance: Normal appearance. He is normal weight.     Comments: Awake, alert, dysarthric; has tape over L eyeglass lens; NAD- wife at bedside  HENT:     Head: Normocephalic.     Comments: L eye slower- and dysconjugate gaze; decreased light touch on B/L side of face; tongue midline; R facial droop.      Right Ear: External ear normal.     Left Ear: External ear normal.     Nose: Nose normal.     Mouth/Throat:     Mouth: Mucous membranes are moist.     Pharynx: Oropharynx is clear. No oropharyngeal exudate.  Eyes:     General:        Right eye: No discharge.        Left eye: No discharge.     Comments: A lot of nystagmus   Cardiovascular:     Rate and Rhythm: Normal rate and regular rhythm.     Heart sounds: Normal heart sounds. No murmur heard.   No gallop.  Pulmonary:     Effort: Pulmonary effort is normal. No respiratory distress.     Breath sounds: Normal breath sounds. No wheezing or rales.  Abdominal:     General: There is no distension.     Tenderness: There is no abdominal tenderness.     Comments: Abd a little firm; normoactive- NT; distended  Genitourinary:    Comments: Foley (+) Musculoskeletal:     Cervical back: Normal range of motion. No rigidity.     Comments: 5/5 in RUE/RLE/LUE/LLE  Skin:    Comments: R hand IV- look OK No skin breakdown seen  Neurological:     Mental Status: He is alert and oriented to person, place, and  time.     Comments: Right facial weakness with flat,dysarthric speech. Diplopia worse on right with horizontal nystagmus--tape on left lens helping with correction. Ataxia with finger to nose Left>Right as well as decrease in LLE motor control. Sensory deficits left face, LUE and LLE.  Very mild slowed processing/but could be due to dysarthria.   Psychiatric:        Mood and Affect: Mood normal.        Behavior: Behavior normal.      Lab Results Last 48 Hours        Results for orders placed or performed during the hospital encounter of 08/29/21 (from the past 48 hour(s))  Glucose, capillary     Status: None    Collection Time: 09/02/21  3:53 PM  Result Value Ref Range    Glucose-Capillary 94 70 - 99 mg/dL      Comment: Glucose reference range applies only to samples taken after fasting for at least 8 hours.  Glucose, capillary     Status: Abnormal    Collection Time: 09/02/21  8:05 PM  Result Value Ref Range    Glucose-Capillary 116 (H) 70 - 99 mg/dL      Comment: Glucose reference range applies only to samples taken after fasting for at least 8 hours.  Glucose, capillary     Status: Abnormal    Collection Time: 09/02/21 11:58 PM  Result Value Ref Range     Glucose-Capillary 105 (H) 70 - 99 mg/dL      Comment: Glucose reference range applies only to samples taken after fasting for at least 8 hours.  Basic metabolic panel     Status: Abnormal    Collection Time: 09/03/21  2:14 AM  Result Value Ref Range    Sodium 135 135 - 145 mmol/L    Potassium 3.1 (L) 3.5 - 5.1 mmol/L    Chloride 102 98 - 111 mmol/L    CO2 26 22 - 32 mmol/L    Glucose, Bld 103 (H) 70 - 99 mg/dL      Comment: Glucose reference range applies only to samples taken after fasting for at least 8 hours.    BUN 15 8 - 23 mg/dL    Creatinine, Ser 1.19 0.61 - 1.24 mg/dL    Calcium 8.7 (L) 8.9 - 10.3 mg/dL    GFR, Estimated >14 >78 mL/min      Comment: (NOTE) Calculated using the CKD-EPI Creatinine Equation (2021)      Anion gap 7 5 - 15      Comment: Performed at [facility], [address] [place], [place] [address]  CBC     Status: Abnormal    Collection Time: 09/03/21  2:14 AM  Result Value Ref Range    WBC 13.6 (H) 4.0 - 10.5 K/uL    RBC 4.09 (L) 4.22 - 5.81 MIL/uL    Hemoglobin 12.8 (L) 13.0 - 17.0 g/dL    HCT 13.0 86.5 - 78.4 %    MCV 96.1 80.0 - 100.0 fL    MCH 31.3 26.0 - 34.0 pg    MCHC 32.6 30.0 - 36.0 g/dL    RDW 69.6 29.5 - 28.4 %    Platelets 267 150 - 400 K/uL    nRBC 0.0 0.0 - 0.2 %      Comment: Performed at [facility], [address], [place], [place] [address]  Glucose, capillary     Status: Abnormal    Collection Time: 09/03/21  3:43 AM  Result Value Ref Range    Glucose-Capillary 103 (H) 70 - 99 mg/dL      Comment: Glucose reference range applies only to samples taken after fasting for at least 8 hours.  Glucose, capillary     Status: Abnormal    Collection Time: 09/03/21  7:54 AM  Result Value Ref Range    Glucose-Capillary 103 (H) 70 - 99 mg/dL      Comment: Glucose reference range applies only to samples taken after fasting for at least 8 hours.  Glucose, capillary     Status: Abnormal    Collection Time: 09/03/21 11:51 AM   Result Value Ref Range    Glucose-Capillary 110 (H) 70 - 99 mg/dL      Comment: Glucose reference range applies only to samples taken after fasting for at least 8 hours.  Glucose, capillary     Status: Abnormal    Collection Time: 09/03/21  3:29 PM  Result Value Ref Range    Glucose-Capillary 104 (H) 70 - 99 mg/dL      Comment: Glucose reference range applies only to samples taken after fasting for at least 8 hours.  Glucose, capillary     Status: Abnormal    Collection Time: 09/03/21  7:52 PM  Result Value Ref Range    Glucose-Capillary 104 (H) 70 - 99 mg/dL      Comment: Glucose reference range applies only to samples taken after fasting for at least 8 hours.  Glucose, capillary     Status: Abnormal    Collection Time: 09/04/21 12:07 AM  Result Value Ref Range    Glucose-Capillary 113 (H) 70 - 99 mg/dL      Comment: Glucose reference range applies only to samples taken after fasting for at least 8 hours.  Basic metabolic panel     Status: Abnormal    Collection Time: 09/04/21  4:05 AM  Result Value Ref Range    Sodium 135 135 - 145 mmol/L    Potassium 4.0 3.5 - 5.1 mmol/L    Chloride 104 98 - 111 mmol/L    CO2 22 22 - 32 mmol/L    Glucose, Bld 110 (H) 70 - 99 mg/dL      Comment: Glucose reference range applies only to samples taken after fasting for at least 8 hours.    BUN 17 8 - 23 mg/dL    Creatinine, Ser 3.35 0.61 - 1.24 mg/dL    Calcium 8.8 (L) 8.9 - 10.3 mg/dL    GFR, Estimated >45 >62 mL/min      Comment: (NOTE) Calculated using the CKD-EPI Creatinine Equation (2021)      Anion gap 9 5 - 15      Comment: Performed at [facility], [address] [place], [place] [address]  CBC     Status: Abnormal    Collection Time: 09/04/21  4:05 AM  Result Value Ref Range    WBC 12.0 (H) 4.0 - 10.5 K/uL    RBC 4.23 4.22 - 5.81 MIL/uL    Hemoglobin 13.4 13.0 - 17.0 g/dL    HCT 37.3 42.8 - 76.8 %    MCV 95.0 80.0 - 100.0 fL    MCH 31.7 26.0 - 34.0 pg    MCHC 33.3 30.0 -  36.0 g/dL    RDW 11.5 72.6 - 20.3 %    Platelets 270 150 - 400 K/uL    nRBC 0.0 0.0 - 0.2 %      Comment:  Performed at [facility], [address], [place], [place] [address]  Glucose, capillary     Status: Abnormal    Collection Time: 09/04/21  4:11 AM  Result Value Ref Range    Glucose-Capillary 116 (H) 70 - 99 mg/dL      Comment: Glucose reference range applies only to samples taken after fasting for at least 8 hours.  Glucose, capillary     Status: Abnormal    Collection Time: 09/04/21  8:08 AM  Result Value Ref Range    Glucose-Capillary 128 (H) 70 - 99 mg/dL      Comment: Glucose reference range applies only to samples taken after fasting for at least 8 hours.  Glucose, capillary     Status: Abnormal    Collection Time: 09/04/21  1:28 PM  Result Value Ref Range    Glucose-Capillary 158 (H) 70 - 99 mg/dL      Comment: Glucose reference range applies only to samples taken after fasting for at least 8 hours.      Imaging Results (Last 48 hours)  No results found.           Medical Problem List and Plan: 1.  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  Antithrombotics: -DVT/anticoagulation:  Pharmaceutical: Lovenox added.              -antiplatelet therapy: ASA/Brilinta. 3. Pain Management: Tylenol prn for HA.  4. Mood: LCSW to follow for evaluation and support.              -antipsychotic agents: N/A 5. Neuropsych: This patient is capable of making decisions on his own behalf. 6. Skin/Wound Care: Routine pressure-relief measures. 7. Fluids/Electrolytes/Nutrition: Monitor I's/O.  Check CMet in AM 8.  HTN: Monitor BP TID.  Orthostatic symptoms reported today. --On hydralazine 25 mg 3 times daily, Cozaar 50 mg twice daily and amlodipine 10 mg daily. --We will check orthostatic vital signs. 9.  Urinary retention: Likely  exacerbated by constipation. Augment bowel program and remove foley in am.  --Continue Flomax--changed to bedtime to avoid orthostatic symptoms. 10. Constipation: Will start patient on Senna S daily.  --MOM today.  11.  Leukocytosis: Monitor for fevers and other signs of infection. 12.  Intermittent hypokalemia: Recheck CMET/Mg level in am.    --May need standing dose supplement is on hydralazine. 13.  Hyperglycemia: Likely stress related as Hemoglobin A1c 5.5. 14.  Dyslipidemia: Continue Lipitor. 15. 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   I have personally performed a face to face diagnostic evaluation of this patient and formulated the key components of the plan.  Additionally, I have personally reviewed laboratory data, imaging studies, as well as relevant notes and concur with the physician assistant's documentation above.   The patient's status has not changed from the original H&P.  Any changes in documentation from the acute care chart have been noted above.       Jacquelynn Cree, PA-C 09/04/2021

## 2021-09-04 NOTE — Progress Notes (Addendum)
STROKE TEAM PROGRESS NOTE   SUBJECTIVE (INTERVAL HISTORY) Patient is lying in bed, awake, alert and in NAD. Glasses in face with left sided tape for diplopia.   His wife is at bedside. Patient c/o of persistent double vision and decreased hearing on right with left side numbness. No new neurological symptoms observed. . All questions answered and verbalized understanding   Vital signs are stable.  Neurological exam unchanged.  TEE shows small PFO.  Lower extremity venous Dopplers negative for DVT. OBJECTIVE Temp:  [97.6 F (36.4 C)-98.6 F (37 C)] 98.2 F (36.8 C) (11/08 0815) Pulse Rate:  [71-92] 86 (11/08 0815) Cardiac Rhythm: Normal sinus rhythm (11/08 0825) Resp:  [14-18] 16 (11/08 0815) BP: (131-164)/(75-100) 131/75 (11/08 0815) SpO2:  [95 %-100 %] 98 % (11/08 0815)  Recent Labs  Lab 09/03/21 1529 09/03/21 1952 09/04/21 0007 09/04/21 0411 09/04/21 0808  GLUCAP 104* 104* 113* 116* 128*    Recent Labs  Lab 08/31/21 0409 09/01/21 0311 09/02/21 0504 09/03/21 0214 09/04/21 0405  NA 134* 134* 137 135 135  K 3.3* 3.1* 3.9 3.1* 4.0  CL 104 103 105 102 104  CO2 21* _0 GLUCOSE 133* 110* 113* 103* 110*  BUN _1 CREATININE 0.75 0.71 0.77 0.76 0.78  CALCIUM 8.0* 8.7* 8.9 8.7* 8.8*    Recent Labs  Lab 08/29/21 0932  AST 22  ALT 15  ALKPHOS 59  BILITOT 0.8  PROT 6.4*  ALBUMIN 3.7    Recent Labs  Lab 08/29/21 0932 08/29/21 0934 08/31/21 0409 09/01/21 0311 09/02/21 0504 09/03/21 0214 09/04/21 0405  WBC 11.0*   < > 13.7* 13.6* 10.8* 13.6* 12.0*  NEUTROABS 5.0  --   --   --   --   --   --   HGB 13.7   < > 12.8* 13.2 13.2 12.8* 13.4  HCT 41.0   < > 38.2* 39.0 39.3 39.3 40.2  MCV 94.9   < > 95.5 94.2 95.6 96.1 95.0  PLT 378   < > 308 273 264 267 270   < > = values in this interval not displayed.    No results for input(s): CKTOTAL, CKMB, CKMBINDEX, TROPONINI in the last 168 hours. No results for input(s): LABPROT, INR in the last 72  hours.  No results for input(s): COLORURINE, LABSPEC, [place], GLUCOSEU, HGBUR, BILIRUBINUR, KETONESUR, PROTEINUR, UROBILINOGEN, NITRITE, LEUKOCYTESUR in the last 72 hours.  Invalid input(s): APPERANCEUR     Component Value Date/Time   CHOL 288 (H) 08/29/2021 2340   TRIG 82 08/29/2021 2340   HDL 52 08/29/2021 2340   CHOLHDL 5.5 08/29/2021 2340   VLDL 16 08/29/2021 2340   LDLCALC 220 (H) 08/29/2021 2340   Lab Results  Component Value Date   HGBA1C 5.5 08/29/2021      Component Value Date/Time   LABOPIA NONE DETECTED 08/30/2021 0751   COCAINSCRNUR NONE DETECTED 08/30/2021 0751   LABBENZ NONE DETECTED 08/30/2021 0751   AMPHETMU NONE DETECTED 08/30/2021 0751   THCU POSITIVE (A) 08/30/2021 0751   LABBARB NONE DETECTED 08/30/2021 0751    No results for input(s): ETH in the last 168 hours.   MR BRAIN WO CONTRAST  Result Date: 09/01/2021 CLINICAL DATA:  Stroke, follow-up. 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 EXAM: MRI HEAD WITHOUT CONTRAST TECHNIQUE: Multiplanar, multiecho pulse sequences of the brain and surrounding structures were obtained without intravenous contrast. COMPARISON:  Head MRI  08/29/2021 FINDINGS: A limited examination was performed at the request of the ordering provider consisting of axial and coronal diffusion weighted imaging and axial susceptibility weighted imaging. There is a large acute infarct in the right cerebellar hemisphere in the PICA territory which has increased in size from the prior MRI. Additional new patchy acute right cerebellar infarcts are present in the AICA territory, and there are also new and larger acute infarcts involving the right middle cerebellar peduncle, right medulla, and right greater than left pons. New punctate acute infarcts are noted in the medial aspects of the left parietal and left occipital lobes. There is a small amount of petechial hemorrhage associated  with the right PICA infarct. 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 Electronically Signed   By: Logan Bores M.D.   On: 09/01/2021 19:52   MR BRAIN WO CONTRAST  Result Date: 08/29/2021 CLINICAL DATA:  Stroke, follow up EXAM: MRI HEAD WITHOUT CONTRAST TECHNIQUE: Multiplanar, multiecho pulse sequences of the brain and surrounding structures were obtained without intravenous contrast. COMPARISON:  None. FINDINGS: Brain: Reduced diffusion is present in the inferior right cerebellum. Additional small foci of involvement in the right brachium pontis and right pontomedullary junction. No significant mass effect. No evidence of intracranial hemorrhage. Patchy foci of T2 hyperintensity in the supratentorial white matter are nonspecific but may reflect mild chronic microvascular ischemic changes. Ventricles and sulci are within normal limits in size and configuration. Vascular: Major vessel flow voids at the skull base are preserved. Skull and upper cervical spine: Normal marrow signal is preserved. Sinuses/Orbits: Paranasal sinus mucosal thickening. Orbits are unremarkable. Other: Sella is unremarkable.  Mastoid air cells are clear. 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 Small additional involvement of right brachium pontis and right pontomedullary junction. No hemorrhage or significant mass effect. Mild chronic microvascular ischemic changes. Electronically Signed   By: Macy Mis M.D.   On: 08/29/2021 13:03   IR Intra Cran Stent  Result Date: 09/02/2021 CLINICAL DATA:  History of progressive onset of dysarthria, nausea, vomiting and left-sided numbness. Patient also developed diplopia. 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 EXAM: INTRACRANIAL STENT (INCL PTA)  COMPARISON:  CT angiogram of the head and neck of August 29, 2021, and MRI of the brain of August 29, 2021. MEDICATIONS: Heparin 4,000 units IV. Ancef 2 g IV antibiotic was administered within 1 hour of the procedure. ANESTHESIA/SEDATION: Mac anesthesia as per Department of Anesthesiology for diagnostic portion followed by general anesthesia for endovascular treatment portion. CONTRAST:  Omnipaque 300 approximately 150 mL. FLUOROSCOPY TIME:  Fluoroscopy Time: 41 minutes 18 seconds (2310 mGy). COMPLICATIONS: None immediate. TECHNIQUE: Informed written consent was obtained from the patient after a thorough discussion of the procedural risks, benefits and alternatives. All questions were addressed. Maximal Sterile Barrier Technique was utilized including caps, mask, sterile gowns, sterile gloves, sterile drape, hand hygiene and skin antiseptic. A timeout was performed prior to the initiation of the procedure. The right forearm to the wrist was prepped and draped in the usual sterile manner. The right radial artery was then identified with ultrasound and its morphology documented. A dorsal palmar anastomosis was verified to be present. Using ultrasound guidance, and a micropuncture set access into the right radial artery was obtained with a 6/7 French radial sheath. The obturator, and the guidewire were removed. Good aspiration  was obtained from the side port of the sheath. Cocktail of 2000 units of heparin, 2.5 mg of verapamil, and 200 mcg of nitroglycerin was then infused through the sheath in diluted form without event. A right radial arteriogram was performed. Over a 0.035 inch Roadrunner guidewire, a 5 [place] Simmons 2 diagnostic catheter was advanced to the aortic arch region, and arteriograms were performed of the right common carotid artery, the left common carotid artery, the left vertebral artery, and the right subclavian artery at the origin of stump of the occluded right vertebral artery. FINDINGS: The  diagnostic study was somewhat marred by persistent motion artifact. The left common carotid arteriogram demonstrates the left external carotid artery and its major branches to be widely patent. The left internal carotid artery at the bulb demonstrates approximately 50% stenosis without evidence of intraluminal filling defects. More distally, the left internal carotid artery is seen to opacify to the cranial skull base. 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 A left posterior communicating artery is seen opacifying the left posterior cerebral artery distribution. The left middle cerebral artery and the left anterior cerebral artery opacify into the capillary and venous phases. Non dominant left vertebral artery demonstrates patency at its origin with brisk ascent to the cranial skull base to supply primarily the ipsilateral left posteroinferior cerebellar artery. The right common carotid arteriogram demonstrates the right external carotid artery and its major branches to be widely patent. The right internal carotid artery at the bulb has approximately 90% stenosis due to a circumferential smooth plaque. More distally, the vessel is seen to opacify to the cranial skull base. The petrous, the cavernous and the supraclinoid segments are widely patent. A right posterior communicating artery is seen opacifying the right posterior cerebral artery distribution. The right middle and the right anterior cerebral arteries opacify into the capillary and venous phases. The right subclavian arteriogram at the origin of the occluded right vertebral artery demonstrates partial reconstitution of the right vertebral artery at the level of C3-C4 from collaterals arising from the cervical branch of the thyrocervical trunk with opacification noted more distally into the dominant right vertebrobasilar junction.  Opacification is noted into the basilar artery, and retrogradely in the right vertebral artery to just above the site of the occlusion in the proximal right vertebral artery. ENDOVASCULAR REVASCULARIZATION OF OCCLUDED DOMINANT PROXIMAL RIGHT VERTEBRAL ARTERY WITH STENT ASSISTED ANGIOPLASTY, AND OF THE MID BASILAR ARTERY WITH STENT ASSISTED ANGIOPLASTY Over the exchange 035 inch 300 cm Rosen exchange guidewire, the combination of a 95 cm Benchmark guide catheter with a support 5.5 [place] Berenstein catheter was advanced and positioned just proximal to the stump of the occluded dominant right vertebral artery. The guidewire and the support catheter were gently retrieved and removed. Good aspiration was obtained from the hub of the Benchmark guide catheter. Bilateral roadmap was then obtained centered over the vertebral artery origin and distally through the [facility] guide catheter. Over an 0.014 inch standard Synchro micro guidewire with a moderate J configuration, inside of an 021 160 cm Trevo Trak microcatheter combination was advanced to just proximal to the occluded dominant right vertebral artery. Using a torque device with gentle manipulation access was obtained with the micro guidewire through the occluded right vertebral artery without difficulty. The guidewire was advanced to the vertebral artery followed by the microcatheter. The guidewire was removed. Good aspiration obtained from the hub of microcatheter. A control arteriogram was  then performed through the microcatheter distally with opacification noted in the right vertebrobasilar junction, the basilar artery, the right posterior cerebral artery, the superior cerebellar arteries and the anterior-inferior cerebellar arteries. Also noted was significant narrowing secondary to an eccentric arteriosclerotic plaque in the mid basilar artery just proximal to the origin of right anterior inferior cerebellar artery. The microcatheter was then exchanged under  constant fluoroscopic guidance for an 014 inch 300 cm Synchro standard micro guidewire with a moderate J configuration at its tip. Measurements were then performed of the most proximal portion of the right vertebral artery from the previous diagnostic arteriogram. It was decided to proceed with initial balloon angioplasty of the origin of the occluded right vertebral artery with a 2.5 mm x 15 mm Apex Monorail angioplasty balloon. This was prepped and purged antegradely with heparinized saline infusion, and retrogradely with 75% contrast and 25% heparinized saline infusion. Using rapid exchange technique, this was then advanced and positioned at the origin of the occluded right vertebral artery with the proximal marker proximal to the origin of the right vertebral artery. Control inflation was then performed using micro inflation syringe device via micro tubing to approximately 8 atmospheres where it was maintained for approximately 15 seconds. The balloon was deflated and retrieved and removed. Control arteriogram performed through [facility] guide catheter demonstrated significantly improved caliber and flow through the angioplastied segment with copious flow noted intracranially. 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 Patency was noted of the posterior cerebral arteries, the superior cerebellar arteries and faintly the anterior-inferior cerebellar arteries bilaterally. Significantly improved caliber was also noted at the origin of the right vertebral artery. A 120 cm Phenom Plus catheter was then advanced over the exchange micro guidewire under fluoroscopic guidance and positioned in the distal right vertebral artery. The Benchmark catheter was now advanced without difficulty into the distal right vertebral artery also. A roadmap was then obtained. Using a torque device, the exchange micro guidewire was safely advanced under fluoroscopic  guidance into the proximal basilar artery and then the distal basilar artery into the right posterior cerebral artery P1 segment. Measurements were then performed of the basilar artery distal and proximal to the prominent stenosis. It was decided to proceed with placement of a Resolute Onyx 3 mm x 15 mm balloon mounted stent intracranially. However, the stented stent delivery apparatus could not be advanced through the 045 Phenom Plus catheter. The Phenom Plus catheter was removed with the wire maintained in the right P1 segment. The Benchmark guide catheter was easily then advanced into the right vertebrobasilar junction just proximal to the basilar artery. The Resolute Onyx which had been prepped with heparinized saline infusion retrogradely and angiographically with 60% contrast and 40% heparinized saline infusion was reintroduced and advanced without difficulty over the exchange wire to the distal end of the Benchmark guide catheter. This stent apparatus was easily advanced and positioned with the proximal and the distal markers adequate distant from the site of the significant mid basilar artery stenosis. The stent was then deployed by inflating balloon using a micro inflation syringe device via micro tubing. Balloon was expanded to approximately 2.9 mm. The balloon was then deflated and retrieved. A control arteriogram performed through the Benchmark guide catheter demonstrated excellent apposition and flow through the stented mid basilar artery. Patency was seen of the posterior cerebral arteries, the superior cerebellar arteries and faintly the anterior-inferior cerebellar arteries. The Benchmark catheter was then retrieved more proximally while the  exchange wire was retrieved into the V4 segment of the right vertebral artery. The Benchmark catheter was retrieved to just proximal to the origin of the right vertebral artery. Measurements were again performed of the vertebral artery at this site. It was  decided to proceed with placement of a 3.5 mm x 18 mm Onyx Frontier balloon mountable stent. After having been prepped in the usual manner, this was then advanced and positioned such that the proximal portion of the device was just proximal to the origin of the right vertebral artery. Thereafter, controlled inflation was performed of the balloon mountable stent to approximately 8 atmospheres. This was maintained for approximately 10 seconds. The balloon was then deflated and retrieved and removed. A control arteriogram performed through the Benchmark guide catheter demonstrated excellent apposition and coverage of the previously occluded right vertebral artery origin and proximal portion. Free flow was now noted through the proximal stent into the intracranial portion with patency maintained of the positioned mid basilar artery stent. Control arteriograms were then performed at 15 and 25 minutes post deployment of the devices. These continued to demonstrate excellent apposition with flow extra cranially and intracranially. The patient at this time was also given approximately 4 mg of intra-arterial Integrilin in order to prevent formation of platelet aggregation at the stented site. None was obvious on diagnostic catheter arteriograms. The exchange guidewire was then retrieved and removed. A final control arteriogram performed through the Benchmark catheter and just proximal to the right vertebral artery origin continued to demonstrate excellent flow through the proximal stent and extra cranially and intracranially. There continued to be excellent flow through the basilar artery into the posterior cerebral arteries, the superior cerebellar arteries with faint opacification of both anterior-inferior cerebellar arteries. The Benchmark guide sheath was removed. The radial sheath was then removed with successful hemostasis with a wrist band at the right radial puncture site. Distal right radial pulse was verified to be  present. A CT of the brain performed on the table demonstrated no evidence of hemorrhage or hydrocephalus. The patient was then extubated. Upon recovery, the patient was able to converse appropriately. He denied any nausea, vomiting or headaches. He was able to move all 4 extremities spontaneously and to command. His pupils were 2 mm equal and sluggish. No change was seen in the right facial droop. The tongue remained in the midline. The patient was then returned to his room in the ICU in stable condition to continue on low-dose IV heparin and 81 aspirin daily, and Brilinta 90 mg b.i.d. Early in the evening, the patient was more awake, alert and appropriately responsive. He continued to have dysarthria with right facial droop. Neurological examination continued to have non sustained nystagmus on right lateral gaze and to a lesser degree left lateral gaze with oscillopsia on the right lateral gaze unchanged. 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 PLAN: Follow-up in clinic 2 weeks post discharge. Electronically Signed   By: Luanne Bras M.D.   On: 09/02/2021 05:39   IR CT Head Ltd  Result Date: 09/02/2021 CLINICAL DATA:  History of progressive onset of dysarthria, nausea, vomiting and left-sided numbness. Patient also developed diplopia. 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  EXAM: INTRACRANIAL STENT (INCL PTA) COMPARISON:  CT angiogram of the head and neck of August 29, 2021, and MRI of the brain of August 29, 2021. MEDICATIONS: Heparin 4,000 units IV. Ancef 2 g IV antibiotic was administered within 1 hour  of the procedure. ANESTHESIA/SEDATION: Mac anesthesia as per Department of Anesthesiology for diagnostic portion followed by general anesthesia for endovascular treatment portion. CONTRAST:  Omnipaque 300 approximately 150 mL. FLUOROSCOPY TIME:  Fluoroscopy Time: 41 minutes 18 seconds (2310 mGy). COMPLICATIONS: None immediate. TECHNIQUE: Informed written consent was obtained from the patient after a thorough discussion of the procedural risks, benefits and alternatives. All questions were addressed. Maximal Sterile Barrier Technique was utilized including caps, mask, sterile gowns, sterile gloves, sterile drape, hand hygiene and skin antiseptic. A timeout was performed prior to the initiation of the procedure. The right forearm to the wrist was prepped and draped in the usual sterile manner. The right radial artery was then identified with ultrasound and its morphology documented. A dorsal palmar anastomosis was verified to be present. Using ultrasound guidance, and a micropuncture set access into the right radial artery was obtained with a 6/7 French radial sheath. The obturator, and the guidewire were removed. Good aspiration was obtained from the side port of the sheath. Cocktail of 2000 units of heparin, 2.5 mg of verapamil, and 200 mcg of nitroglycerin was then infused through the sheath in diluted form without event. A right radial arteriogram was performed. Over a 0.035 inch Roadrunner guidewire, a 5 [place] Simmons 2 diagnostic catheter was advanced to the aortic arch region, and arteriograms were performed of the right common carotid artery, the left common carotid artery, the left vertebral artery, and the right subclavian artery at the origin of stump of the occluded right vertebral artery. FINDINGS: The diagnostic study was somewhat marred by persistent motion artifact. The left common carotid arteriogram demonstrates the left external carotid artery and its major branches to be widely patent. The left  internal carotid artery at the bulb demonstrates approximately 50% stenosis without evidence of intraluminal filling defects. More distally, the left internal carotid artery is seen to opacify to the cranial skull base. 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 A left posterior communicating artery is seen opacifying the left posterior cerebral artery distribution. The left middle cerebral artery and the left anterior cerebral artery opacify into the capillary and venous phases. Non dominant left vertebral artery demonstrates patency at its origin with brisk ascent to the cranial skull base to supply primarily the ipsilateral left posteroinferior cerebellar artery. The right common carotid arteriogram demonstrates the right external carotid artery and its major branches to be widely patent. The right internal carotid artery at the bulb has approximately 90% stenosis due to a circumferential smooth plaque. More distally, the vessel is seen to opacify to the cranial skull base. The petrous, the cavernous and the supraclinoid segments are widely patent. A right posterior communicating artery is seen opacifying the right posterior cerebral artery distribution. The right middle and the right anterior cerebral arteries opacify into the capillary and venous phases. The right subclavian arteriogram at the origin of the occluded right vertebral artery demonstrates partial reconstitution of the right vertebral artery at the level of C3-C4 from collaterals arising from the cervical branch of the thyrocervical trunk with opacification noted more distally into the dominant right vertebrobasilar junction. Opacification is noted into the basilar artery, and retrogradely in the right  vertebral artery to just above the site of the occlusion in the proximal right vertebral artery. ENDOVASCULAR REVASCULARIZATION OF  OCCLUDED DOMINANT PROXIMAL RIGHT VERTEBRAL ARTERY WITH STENT ASSISTED ANGIOPLASTY, AND OF THE MID BASILAR ARTERY WITH STENT ASSISTED ANGIOPLASTY Over the exchange 035 inch 300 cm Rosen exchange guidewire, the combination of a 95 cm Benchmark guide catheter with a support 5.5 [place] Berenstein catheter was advanced and positioned just proximal to the stump of the occluded dominant right vertebral artery. The guidewire and the support catheter were gently retrieved and removed. Good aspiration was obtained from the hub of the Benchmark guide catheter. Bilateral roadmap was then obtained centered over the vertebral artery origin and distally through the [facility] guide catheter. Over an 0.014 inch standard Synchro micro guidewire with a moderate J configuration, inside of an 021 160 cm Trevo Trak microcatheter combination was advanced to just proximal to the occluded dominant right vertebral artery. Using a torque device with gentle manipulation access was obtained with the micro guidewire through the occluded right vertebral artery without difficulty. The guidewire was advanced to the vertebral artery followed by the microcatheter. The guidewire was removed. Good aspiration obtained from the hub of microcatheter. A control arteriogram was then performed through the microcatheter distally with opacification noted in the right vertebrobasilar junction, the basilar artery, the right posterior cerebral artery, the superior cerebellar arteries and the anterior-inferior cerebellar arteries. Also noted was significant narrowing secondary to an eccentric arteriosclerotic plaque in the mid basilar artery just proximal to the origin of right anterior inferior cerebellar artery. The microcatheter was then exchanged under constant fluoroscopic guidance for an 014 inch 300 cm Synchro standard micro guidewire with a moderate J configuration at its tip. Measurements were then performed of the most proximal portion of the right  vertebral artery from the previous diagnostic arteriogram. It was decided to proceed with initial balloon angioplasty of the origin of the occluded right vertebral artery with a 2.5 mm x 15 mm Apex Monorail angioplasty balloon. This was prepped and purged antegradely with heparinized saline infusion, and retrogradely with 75% contrast and 25% heparinized saline infusion. Using rapid exchange technique, this was then advanced and positioned at the origin of the occluded right vertebral artery with the proximal marker proximal to the origin of the right vertebral artery. Control inflation was then performed using micro inflation syringe device via micro tubing to approximately 8 atmospheres where it was maintained for approximately 15 seconds. The balloon was deflated and retrieved and removed. Control arteriogram performed through [facility] guide catheter demonstrated significantly improved caliber and flow through the angioplastied segment with copious flow noted intracranially. 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 Patency was noted of the posterior cerebral arteries, the superior cerebellar arteries and faintly the anterior-inferior cerebellar arteries bilaterally. Significantly improved caliber was also noted at the origin of the right vertebral artery. A 120 cm Phenom Plus catheter was then advanced over the exchange micro guidewire under fluoroscopic guidance and positioned in the distal right vertebral artery. The Benchmark catheter was now advanced without difficulty into the distal right vertebral artery also. A roadmap was then obtained. Using a torque device, the exchange micro guidewire was safely advanced under fluoroscopic guidance into the proximal basilar artery and then the distal basilar artery into the right posterior cerebral artery P1 segment. Measurements were then performed of the basilar artery distal and proximal to  the prominent stenosis. It was decided to proceed with placement of a  Resolute Onyx 3 mm x 15 mm balloon mounted stent intracranially. However, the stented stent delivery apparatus could not be advanced through the 045 Phenom Plus catheter. The Phenom Plus catheter was removed with the wire maintained in the right P1 segment. The Benchmark guide catheter was easily then advanced into the right vertebrobasilar junction just proximal to the basilar artery. The Resolute Onyx which had been prepped with heparinized saline infusion retrogradely and angiographically with 60% contrast and 40% heparinized saline infusion was reintroduced and advanced without difficulty over the exchange wire to the distal end of the Benchmark guide catheter. This stent apparatus was easily advanced and positioned with the proximal and the distal markers adequate distant from the site of the significant mid basilar artery stenosis. The stent was then deployed by inflating balloon using a micro inflation syringe device via micro tubing. Balloon was expanded to approximately 2.9 mm. The balloon was then deflated and retrieved. A control arteriogram performed through the Benchmark guide catheter demonstrated excellent apposition and flow through the stented mid basilar artery. Patency was seen of the posterior cerebral arteries, the superior cerebellar arteries and faintly the anterior-inferior cerebellar arteries. The Benchmark catheter was then retrieved more proximally while the exchange wire was retrieved into the V4 segment of the right vertebral artery. The Benchmark catheter was retrieved to just proximal to the origin of the right vertebral artery. Measurements were again performed of the vertebral artery at this site. It was decided to proceed with placement of a 3.5 mm x 18 mm Onyx Frontier balloon mountable stent. After having been prepped in the usual manner, this was then advanced and positioned such that the proximal portion of  the device was just proximal to the origin of the right vertebral artery. Thereafter, controlled inflation was performed of the balloon mountable stent to approximately 8 atmospheres. This was maintained for approximately 10 seconds. The balloon was then deflated and retrieved and removed. A control arteriogram performed through the Benchmark guide catheter demonstrated excellent apposition and coverage of the previously occluded right vertebral artery origin and proximal portion. Free flow was now noted through the proximal stent into the intracranial portion with patency maintained of the positioned mid basilar artery stent. Control arteriograms were then performed at 15 and 25 minutes post deployment of the devices. These continued to demonstrate excellent apposition with flow extra cranially and intracranially. The patient at this time was also given approximately 4 mg of intra-arterial Integrilin in order to prevent formation of platelet aggregation at the stented site. None was obvious on diagnostic catheter arteriograms. The exchange guidewire was then retrieved and removed. A final control arteriogram performed through the Benchmark catheter and just proximal to the right vertebral artery origin continued to demonstrate excellent flow through the proximal stent and extra cranially and intracranially. There continued to be excellent flow through the basilar artery into the posterior cerebral arteries, the superior cerebellar arteries with faint opacification of both anterior-inferior cerebellar arteries. The Benchmark guide sheath was removed. The radial sheath was then removed with successful hemostasis with a wrist band at the right radial puncture site. Distal right radial pulse was verified to be present. A CT of the brain performed on the table demonstrated no evidence of hemorrhage or hydrocephalus. The patient was then extubated. Upon recovery, the patient was able to converse appropriately. He denied  any nausea, vomiting or headaches. He was able to move all 4 extremities spontaneously and to command. His pupils were 2 mm equal and sluggish. No change  was seen in the right facial droop. The tongue remained in the midline. The patient was then returned to his room in the ICU in stable condition to continue on low-dose IV heparin and 81 aspirin daily, and Brilinta 90 mg b.i.d. Early in the evening, the patient was more awake, alert and appropriately responsive. He continued to have dysarthria with right facial droop. Neurological examination continued to have non sustained nystagmus on right lateral gaze and to a lesser degree left lateral gaze with oscillopsia on the right lateral gaze unchanged. 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 PLAN: Follow-up in clinic 2 weeks post discharge. Electronically Signed   By: Luanne Bras M.D.   On: 09/02/2021 05:39   IR US Guide Vasc Access Right  Result Date: 09/02/2021 CLINICAL DATA:  History of progressive onset of dysarthria, nausea, vomiting and left-sided numbness. Patient also developed diplopia. 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 EXAM: INTRACRANIAL STENT (INCL PTA) COMPARISON:  CT angiogram of the head and neck of August 29, 2021, and MRI of the brain of August 29, 2021. MEDICATIONS: Heparin 4,000 units IV. Ancef 2 g IV antibiotic was administered within 1 hour of the procedure. ANESTHESIA/SEDATION: Mac anesthesia as per Department of Anesthesiology for diagnostic portion followed by general anesthesia for endovascular treatment portion. CONTRAST:   Omnipaque 300 approximately 150 mL. FLUOROSCOPY TIME:  Fluoroscopy Time: 41 minutes 18 seconds (2310 mGy). COMPLICATIONS: None immediate. TECHNIQUE: Informed written consent was obtained from the patient after a thorough discussion of the procedural risks, benefits and alternatives. All questions were addressed. Maximal Sterile Barrier Technique was utilized including caps, mask, sterile gowns, sterile gloves, sterile drape, hand hygiene and skin antiseptic. A timeout was performed prior to the initiation of the procedure. The right forearm to the wrist was prepped and draped in the usual sterile manner. The right radial artery was then identified with ultrasound and its morphology documented. A dorsal palmar anastomosis was verified to be present. Using ultrasound guidance, and a micropuncture set access into the right radial artery was obtained with a 6/7 French radial sheath. The obturator, and the guidewire were removed. Good aspiration was obtained from the side port of the sheath. Cocktail of 2000 units of heparin, 2.5 mg of verapamil, and 200 mcg of nitroglycerin was then infused through the sheath in diluted form without event. A right radial arteriogram was performed. Over a 0.035 inch Roadrunner guidewire, a 5 [place] Simmons 2 diagnostic catheter was advanced to the aortic arch region, and arteriograms were performed of the right common carotid artery, the left common carotid artery, the left vertebral artery, and the right subclavian artery at the origin of stump of the occluded right vertebral artery. FINDINGS: The diagnostic study was somewhat marred by persistent motion artifact. The left common carotid arteriogram demonstrates the left external carotid artery and its major branches to be widely patent. The left internal carotid artery at the bulb demonstrates approximately 50% stenosis without evidence of intraluminal filling defects. More distally, the left internal carotid artery is seen to opacify  to the cranial skull base. 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  A left posterior communicating artery is seen opacifying the left posterior cerebral artery distribution. The left middle cerebral artery and the left anterior cerebral artery opacify into the capillary and venous phases. Non dominant left vertebral artery demonstrates patency at its origin with brisk ascent to the cranial skull base to supply primarily the ipsilateral left posteroinferior cerebellar artery. The right common carotid arteriogram demonstrates the right external carotid artery and its major branches to be widely patent. The right internal carotid artery at the bulb has approximately 90% stenosis due to a circumferential smooth plaque. More distally, the vessel is seen to opacify to the cranial skull base. The petrous, the cavernous and the supraclinoid segments are widely patent. A right posterior communicating artery is seen opacifying the right posterior cerebral artery distribution. The right middle and the right anterior cerebral arteries opacify into the capillary and venous phases. The right subclavian arteriogram at the origin of the occluded right vertebral artery demonstrates partial reconstitution of the right vertebral artery at the level of C3-C4 from collaterals arising from the cervical branch of the thyrocervical trunk with opacification noted more distally into the dominant right vertebrobasilar junction. Opacification is noted into the basilar artery, and retrogradely in the right vertebral artery to just above the site of the occlusion in the proximal right vertebral artery. ENDOVASCULAR REVASCULARIZATION OF OCCLUDED DOMINANT PROXIMAL RIGHT VERTEBRAL ARTERY WITH STENT ASSISTED ANGIOPLASTY, AND OF THE MID BASILAR ARTERY WITH STENT ASSISTED ANGIOPLASTY Over the exchange 035 inch 300 cm Rosen exchange  guidewire, the combination of a 95 cm Benchmark guide catheter with a support 5.5 [place] Berenstein catheter was advanced and positioned just proximal to the stump of the occluded dominant right vertebral artery. The guidewire and the support catheter were gently retrieved and removed. Good aspiration was obtained from the hub of the Benchmark guide catheter. Bilateral roadmap was then obtained centered over the vertebral artery origin and distally through the [facility] guide catheter. Over an 0.014 inch standard Synchro micro guidewire with a moderate J configuration, inside of an 021 160 cm Trevo Trak microcatheter combination was advanced to just proximal to the occluded dominant right vertebral artery. Using a torque device with gentle manipulation access was obtained with the micro guidewire through the occluded right vertebral artery without difficulty. The guidewire was advanced to the vertebral artery followed by the microcatheter. The guidewire was removed. Good aspiration obtained from the hub of microcatheter. A control arteriogram was then performed through the microcatheter distally with opacification noted in the right vertebrobasilar junction, the basilar artery, the right posterior cerebral artery, the superior cerebellar arteries and the anterior-inferior cerebellar arteries. Also noted was significant narrowing secondary to an eccentric arteriosclerotic plaque in the mid basilar artery just proximal to the origin of right anterior inferior cerebellar artery. The microcatheter was then exchanged under constant fluoroscopic guidance for an 014 inch 300 cm Synchro standard micro guidewire with a moderate J configuration at its tip. Measurements were then performed of the most proximal portion of the right vertebral artery from the previous diagnostic arteriogram. It was decided to proceed with initial balloon angioplasty of the origin of the occluded right vertebral artery with a 2.5 mm x 15 mm Apex  Monorail angioplasty balloon. This was prepped and purged antegradely with heparinized saline infusion, and retrogradely with 75% contrast and 25% heparinized saline infusion. Using rapid exchange technique, this was then advanced and positioned at the origin of the occluded right vertebral artery with the proximal marker proximal to the origin of  the right vertebral artery. Control inflation was then performed using micro inflation syringe device via micro tubing to approximately 8 atmospheres where it was maintained for approximately 15 seconds. The balloon was deflated and retrieved and removed. Control arteriogram performed through [facility] guide catheter demonstrated significantly improved caliber and flow through the angioplastied segment with copious flow noted intracranially. 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 Patency was noted of the posterior cerebral arteries, the superior cerebellar arteries and faintly the anterior-inferior cerebellar arteries bilaterally. Significantly improved caliber was also noted at the origin of the right vertebral artery. A 120 cm Phenom Plus catheter was then advanced over the exchange micro guidewire under fluoroscopic guidance and positioned in the distal right vertebral artery. The Benchmark catheter was now advanced without difficulty into the distal right vertebral artery also. A roadmap was then obtained. Using a torque device, the exchange micro guidewire was safely advanced under fluoroscopic guidance into the proximal basilar artery and then the distal basilar artery into the right posterior cerebral artery P1 segment. Measurements were then performed of the basilar artery distal and proximal to the prominent stenosis. It was decided to proceed with placement of a Resolute Onyx 3 mm x 15 mm balloon mounted stent intracranially. However, the stented stent delivery apparatus could not be  advanced through the 045 Phenom Plus catheter. The Phenom Plus catheter was removed with the wire maintained in the right P1 segment. The Benchmark guide catheter was easily then advanced into the right vertebrobasilar junction just proximal to the basilar artery. The Resolute Onyx which had been prepped with heparinized saline infusion retrogradely and angiographically with 60% contrast and 40% heparinized saline infusion was reintroduced and advanced without difficulty over the exchange wire to the distal end of the Benchmark guide catheter. This stent apparatus was easily advanced and positioned with the proximal and the distal markers adequate distant from the site of the significant mid basilar artery stenosis. The stent was then deployed by inflating balloon using a micro inflation syringe device via micro tubing. Balloon was expanded to approximately 2.9 mm. The balloon was then deflated and retrieved. A control arteriogram performed through the Benchmark guide catheter demonstrated excellent apposition and flow through the stented mid basilar artery. Patency was seen of the posterior cerebral arteries, the superior cerebellar arteries and faintly the anterior-inferior cerebellar arteries. The Benchmark catheter was then retrieved more proximally while the exchange wire was retrieved into the V4 segment of the right vertebral artery. The Benchmark catheter was retrieved to just proximal to the origin of the right vertebral artery. Measurements were again performed of the vertebral artery at this site. It was decided to proceed with placement of a 3.5 mm x 18 mm Onyx Frontier balloon mountable stent. After having been prepped in the usual manner, this was then advanced and positioned such that the proximal portion of the device was just proximal to the origin of the right vertebral artery. Thereafter, controlled inflation was performed of the balloon mountable stent to approximately 8 atmospheres. This was  maintained for approximately 10 seconds. The balloon was then deflated and retrieved and removed. A control arteriogram performed through the Benchmark guide catheter demonstrated excellent apposition and coverage of the previously occluded right vertebral artery origin and proximal portion. Free flow was now noted through the proximal stent into the intracranial portion with patency maintained of the positioned mid basilar artery stent. Control arteriograms were then performed at 15 and 25 minutes post  deployment of the devices. These continued to demonstrate excellent apposition with flow extra cranially and intracranially. The patient at this time was also given approximately 4 mg of intra-arterial Integrilin in order to prevent formation of platelet aggregation at the stented site. None was obvious on diagnostic catheter arteriograms. The exchange guidewire was then retrieved and removed. A final control arteriogram performed through the Benchmark catheter and just proximal to the right vertebral artery origin continued to demonstrate excellent flow through the proximal stent and extra cranially and intracranially. There continued to be excellent flow through the basilar artery into the posterior cerebral arteries, the superior cerebellar arteries with faint opacification of both anterior-inferior cerebellar arteries. The Benchmark guide sheath was removed. The radial sheath was then removed with successful hemostasis with a wrist band at the right radial puncture site. Distal right radial pulse was verified to be present. A CT of the brain performed on the table demonstrated no evidence of hemorrhage or hydrocephalus. The patient was then extubated. Upon recovery, the patient was able to converse appropriately. He denied any nausea, vomiting or headaches. He was able to move all 4 extremities spontaneously and to command. His pupils were 2 mm equal and sluggish. No change was seen in the right facial droop. The  tongue remained in the midline. The patient was then returned to his room in the ICU in stable condition to continue on low-dose IV heparin and 81 aspirin daily, and Brilinta 90 mg b.i.d. Early in the evening, the patient was more awake, alert and appropriately responsive. He continued to have dysarthria with right facial droop. Neurological examination continued to have non sustained nystagmus on right lateral gaze and to a lesser degree left lateral gaze with oscillopsia on the right lateral gaze unchanged. 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 PLAN: Follow-up in clinic 2 weeks post discharge. Electronically Signed   By: Luanne Bras M.D.   On: 09/02/2021 05:39   CT CEREBRAL PERFUSION W CONTRAST  Result Date: 08/29/2021 CLINICAL DATA:  Neuro deficit, acute, stroke suspected EXAM: CT ANGIOGRAPHY HEAD AND NECK CT PERFUSION BRAIN TECHNIQUE: Multidetector CT imaging of the head and neck was performed using the standard protocol during bolus administration of intravenous contrast. Multiplanar CT image reconstructions and MIPs were obtained to evaluate the vascular anatomy. Carotid stenosis measurements (when applicable) are obtained utilizing NASCET criteria, using the distal internal carotid diameter as the denominator. Multiphase CT imaging of the brain was performed following IV bolus contrast injection. Subsequent parametric perfusion maps were calculated using RAPID software. CONTRAST:  100 mL Omnipaque 350 COMPARISON:  None. FINDINGS: CTA NECK Aortic arch: Great vessel origins are patent. Right carotid system: Patent. Noncalcified plaque at the ICA origin causes 65% stenosis. Left carotid system: Patent. Mixed plaque at the ICA origin causes less than 50% stenosis. Vertebral  arteries: Left vertebral artery is patent. Absent opacification of the proximal right vertebral artery at the origin with minimal reconstitution of the V1 segment followed by reocclusion. The V2 segment is occluded proximally with reconstitution at the C4 level. Skeleton: Mild cervical spine degenerative changes, greatest at C6-C7. Other neck: Unremarkable. Upper chest: No apical lung mass. Review of the MIP images confirms the above findings CTA HEAD Anterior circulation: Intracranial internal carotid arteries are patent with mild calcified plaque. Anterior and middle cerebral arteries are patent. Posterior  circulation: There is decreased caliber of the intracranial left vertebral artery throughout its course. Appears to terminate at patent left PICA. Intracranial right vertebral artery is patent. Basilar is patent but there is low density within the lumen proximally. Superior cerebellar artery origins are patent. Bilateral posterior communicating arteries are present. Posterior cerebral arteries are patent. Venous sinuses: As permitted by contrast timing, patent. Review of the MIP images confirms the above findings CT Brain Perfusion Findings: CBF (<30%) Volume: 71m Perfusion (Tmax>6.0s) volume: 049mMismatch Volume: 18m3mnfarction Location: None. IMPRESSION: Abnormal extracranial right vertebral artery with limited opacification proximally and reconstitution at the C4 level. May reflect high-grade origin stenosis. Right PICA or like AICA origins are not identified. Basilar artery is patent but there is question of nonocclusive clot proximally. Noncalcified plaque at the right ICA origin causes 65% stenosis. Mixed plaque at the left ICA origin causes less than 50% stenosis. Diffuse mild narrowing of the intracranial left vertebral artery, which terminates as a PICA. Perfusion imaging demonstrates no evidence of core infarction or penumbra, but there is limited evaluation of the posterior fossa. Initial results were  provided by telephone at the time of interpretation on 08/29/2021 at 10:20 am to [facility] these results. Electronically Signed   By: PraMacy MisD.   On: 08/29/2021 10:44   ECHOCARDIOGRAM COMPLETE  Result Date: 08/30/2021    ECHOCARDIOGRAM REPORT   Patient Name:   Maurice Little Date of Exam: 08/30/2021 Medical Rec #:  [id number]:       72.0 in Accession #:    [id number]:       202.8 lb Date of Birth:  1/104-12-1957BSA:          2.143 m Patient Age:    [age]   BP:           155/93 mmHg Patient Gender: M          HR:           76 bpm. Exam Location:  Inpatient Procedure: 2D Echo, Color Doppler and Cardiac Doppler Indications:    Eval for PFO  History:        Patient has no prior history of Echocardiogram examinations.  Sonographer:    TayJyl Heinzferring Phys: [id number]. Left ventricular ejection fraction, by estimation, is 60 to 65%. The left ventricle has normal function. The left ventricle has no regional wall motion abnormalities. Left ventricular diastolic parameters are consistent with Grade II diastolic dysfunction (pseudonormalization).  2. Right ventricular systolic function is normal. The right ventricular size is normal. Tricuspid regurgitation signal is inadequate for assessing PA pressure.  3. The mitral valve is normal in structure. No evidence of mitral valve regurgitation. No evidence of mitral stenosis.  4. The aortic valve is tricuspid. Aortic valve regurgitation is not visualized. Mild aortic valve sclerosis is present, with no evidence of aortic valve stenosis.  5. The inferior vena cava is normal in size with greater than 50% respiratory variability, suggesting right atrial pressure of 3 mmHg. FINDINGS  Left Ventricle: Left ventricular ejection fraction, by estimation, is 60 to 65%. The left ventricle has normal function. The left ventricle has no regional wall motion abnormalities. The left ventricular  internal cavity size was normal in size. There is  no left ventricular hypertrophy. Left ventricular diastolic parameters are consistent with Grade II diastolic dysfunction (pseudonormalization). Right Ventricle: The right ventricular size is normal. No increase in  right ventricular wall thickness. Right ventricular systolic function is normal. Tricuspid regurgitation signal is inadequate for assessing PA pressure. Left Atrium: Left atrial size was normal in size. Right Atrium: Right atrial size was normal in size. Pericardium: There is no evidence of pericardial effusion. Mitral Valve: The mitral valve is normal in structure. No evidence of mitral valve regurgitation. No evidence of mitral valve stenosis. Tricuspid Valve: The tricuspid valve is normal in structure. Tricuspid valve regurgitation is not demonstrated. Aortic Valve: The aortic valve is tricuspid. Aortic valve regurgitation is not visualized. Mild aortic valve sclerosis is present, with no evidence of aortic valve stenosis. Aortic valve peak gradient measures 9.4 mmHg. Pulmonic Valve: The pulmonic valve was normal in structure. Pulmonic valve regurgitation is trivial. Aorta: The aortic root is normal in size and structure. Venous: The inferior vena cava is normal in size with greater than 50% respiratory variability, suggesting right atrial pressure of 3 mmHg. IAS/Shunts: No atrial level shunt detected by color flow Doppler.  LEFT VENTRICLE PLAX 2D LVIDd:         4.40 cm      Diastology LVIDs:         2.80 cm      LV e' medial:    7.07 cm/s LV PW:         1.00 cm      LV E/e' medial:  8.9 LV IVS:        1.00 cm      LV e' lateral:   6.85 cm/s LVOT diam:     2.10 cm      LV E/e' lateral: 9.2 LV SV:         84 LV SV Index:   39 LVOT Area:     3.46 cm  LV Volumes (MOD) LV vol d, MOD A2C: 96.1 ml LV vol d, MOD A4C: 107.0 ml LV vol s, MOD A2C: 43.0 ml LV vol s, MOD A4C: 44.4 ml LV SV MOD A2C:     53.1 ml LV SV MOD A4C:     107.0 ml LV SV MOD BP:      58.1  ml RIGHT VENTRICLE             IVC RV Basal diam:  2.60 cm     IVC diam: 1.70 cm RV Mid diam:    2.50 cm RV S prime:     15.40 cm/s TAPSE (M-mode): 2.5 cm LEFT ATRIUM           Index        RIGHT ATRIUM           Index LA diam:      3.60 cm 1.68 cm/m   RA Area:     14.90 cm LA Vol (A2C): 37.2 ml 17.36 ml/m  RA Volume:   29.30 ml  13.67 ml/m LA Vol (A4C): 50.2 ml 23.42 ml/m  AORTIC VALVE AV Area (Vmax): 2.94 cm AV Vmax:        153.00 cm/s AV Peak Grad:   9.4 mmHg LVOT Vmax:      130.00 cm/s LVOT Vmean:     73.000 cm/s LVOT VTI:       0.243 m  AORTA Ao Root diam: 2.90 cm Ao Asc diam:  3.40 cm MITRAL VALVE MV Area (PHT): 3.31 cm    SHUNTS MV Decel Time: 229 msec    Systemic VTI:  0.24 m MV E velocity: 62.90 cm/s  Systemic Diam: 2.10 cm MV A velocity: 67.00 cm/s  MV E/A ratio:  0.94 Dalton McleanMD Electronically signed by Franki Monte Signature Date/Time: 08/30/2021/2:45:21 PM    Final    CT HEAD CODE STROKE WO CONTRAST  Result Date: 08/29/2021 CLINICAL DATA:  Code stroke.  Left-sided weakness EXAM: CT HEAD WITHOUT CONTRAST TECHNIQUE: Contiguous axial images were obtained from the base of the skull through the vertex without intravenous contrast. COMPARISON:  None. FINDINGS: Brain: There is no acute intracranial hemorrhage, mass effect, or edema. Gray-white differentiation is preserved. There is an age-indeterminate small infarct of the right inferior cerebellum. Ventricles and sulci are normal in size and configuration. No extra-axial collection. Vascular: No hyperdense vessel. Intracranial atherosclerotic calcification at the skull base. Skull: Unremarkable. Sinuses/Orbits: Lobular mucosal thickening. Orbits are unremarkable. Other: Mastoid air cells are clear. ASPECTS [facility] Stroke Program Early CT Score) - Ganglionic level infarction (caudate, lentiform nuclei, internal capsule, insula, M1-M3 cortex): 7 - Supraganglionic infarction (M4-M6 cortex): 3 Total score (0-10 with 10 being normal): 10  IMPRESSION: There is no acute intracranial hemorrhage. ASPECT score is 10. 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 These results were communicated to Dr. Rory Percy at 9:44 am on 08/29/2021 by text page via the [facility] messaging system. Electronically Signed   By: Macy Mis M.D.   On: 08/29/2021 09:46   VAS [place] LOWER EXTREMITY VENOUS (DVT)  Result Date: 08/31/2021  Lower Venous DVT Study Patient Name:  RIDWAN BONDY  Date of Exam:   08/31/2021 Medical Rec #: [id number]   Accession #:    [id number] Date of Birth: September 27, 1956    Patient Gender: M Patient Age:   47 years Exam Location:  [facility] Procedure:      VAS [place] LOWER EXTREMITY VENOUS (DVT) Referring Phys: Cornelius Moras XU --------------------------------------------------------------------------------  Indications: Stroke.  Comparison Study: no prior Performing Technologist: Archie Patten RVS  Examination Guidelines: A complete evaluation includes B-mode imaging, spectral Doppler, color Doppler, and power Doppler as needed of all accessible portions of each vessel. Bilateral testing is considered an integral part of a complete examination. Limited examinations for reoccurring indications may be performed as noted. The reflux portion of the exam is performed with the patient in reverse Trendelenburg.  +---------+---------------+---------+-----------+----------+--------------+ RIGHT    CompressibilityPhasicitySpontaneityPropertiesThrombus Aging +---------+---------------+---------+-----------+----------+--------------+ CFV      Full           Yes      Yes                                 +---------+---------------+---------+-----------+----------+--------------+ SFJ      Full                                                        +---------+---------------+---------+-----------+----------+--------------+ FV Prox  Full                                                         +---------+---------------+---------+-----------+----------+--------------+ FV Mid   Full                                                        +---------+---------------+---------+-----------+----------+--------------+  FV DistalFull                                                        +---------+---------------+---------+-----------+----------+--------------+ PFV      Full                                                        +---------+---------------+---------+-----------+----------+--------------+ POP      Full           Yes      Yes                                 +---------+---------------+---------+-----------+----------+--------------+ PTV      Full                                                        +---------+---------------+---------+-----------+----------+--------------+ PERO     Full                                                        +---------+---------------+---------+-----------+----------+--------------+   +---------+---------------+---------+-----------+----------+--------------+ LEFT     CompressibilityPhasicitySpontaneityPropertiesThrombus Aging +---------+---------------+---------+-----------+----------+--------------+ CFV      Full           Yes      Yes                                 +---------+---------------+---------+-----------+----------+--------------+ SFJ      Full                                                        +---------+---------------+---------+-----------+----------+--------------+ FV Prox  Full                                                        +---------+---------------+---------+-----------+----------+--------------+ FV Mid   Full                                                        +---------+---------------+---------+-----------+----------+--------------+ FV DistalFull                                                         +---------+---------------+---------+-----------+----------+--------------+  PFV      Full                                                        +---------+---------------+---------+-----------+----------+--------------+ POP      Full           Yes      Yes                                 +---------+---------------+---------+-----------+----------+--------------+ PTV      Full                                                        +---------+---------------+---------+-----------+----------+--------------+ PERO     Full                                                        +---------+---------------+---------+-----------+----------+--------------+     Summary: BILATERAL: - No evidence of deep vein thrombosis seen in the lower extremities, bilaterally. - No evidence of superficial venous thrombosis in the lower extremities, bilaterally. -   *See table(s) above for measurements and observations. Electronically signed by Monica Martinez MD on 08/31/2021 at 12:34:17 PM.    Final    CT ANGIO HEAD NECK W WO CM (CODE STROKE)  Result Date: 08/29/2021 CLINICAL DATA:  Neuro deficit, acute, stroke suspected EXAM: CT ANGIOGRAPHY HEAD AND NECK CT PERFUSION BRAIN TECHNIQUE: Multidetector CT imaging of the head and neck was performed using the standard protocol during bolus administration of intravenous contrast. Multiplanar CT image reconstructions and MIPs were obtained to evaluate the vascular anatomy. Carotid stenosis measurements (when applicable) are obtained utilizing NASCET criteria, using the distal internal carotid diameter as the denominator. Multiphase CT imaging of the brain was performed following IV bolus contrast injection. Subsequent parametric perfusion maps were calculated using RAPID software. CONTRAST:  100 mL Omnipaque 350 COMPARISON:  None. FINDINGS: CTA NECK Aortic arch: Great vessel origins are patent. Right carotid system: Patent. Noncalcified plaque at the ICA origin  causes 65% stenosis. Left carotid system: Patent. Mixed plaque at the ICA origin causes less than 50% stenosis. Vertebral arteries: Left vertebral artery is patent. Absent opacification of the proximal right vertebral artery at the origin with minimal reconstitution of the V1 segment followed by reocclusion. The V2 segment is occluded proximally with reconstitution at the C4 level. Skeleton: Mild cervical spine degenerative changes, greatest at C6-C7. Other neck: Unremarkable. Upper chest: No apical lung mass. Review of the MIP images confirms the above findings CTA HEAD Anterior circulation: Intracranial internal carotid arteries are patent with mild calcified plaque. Anterior and middle cerebral arteries are patent. Posterior circulation: There is decreased caliber of the intracranial left vertebral artery throughout its course. Appears to terminate at patent left PICA. Intracranial right vertebral artery is patent. Basilar is patent but there is low density within the lumen proximally. Superior cerebellar artery origins are patent. Bilateral posterior communicating arteries are present. Posterior cerebral arteries  are patent. Venous sinuses: As permitted by contrast timing, patent. Review of the MIP images confirms the above findings CT Brain Perfusion Findings: CBF (<30%) Volume: 46m Perfusion (Tmax>6.0s) volume: 053mMismatch Volume: 24m36mnfarction Location: None. IMPRESSION: Abnormal extracranial right vertebral artery with limited opacification proximally and reconstitution at the C4 level. May reflect high-grade origin stenosis. Right PICA or like AICA origins are not identified. Basilar artery is patent but there is question of nonocclusive clot proximally. Noncalcified plaque at the right ICA origin causes 65% stenosis. Mixed plaque at the left ICA origin causes less than 50% stenosis. Diffuse mild narrowing of the intracranial left vertebral artery, which terminates as a PICA. Perfusion imaging  demonstrates no evidence of core infarction or penumbra, but there is limited evaluation of the posterior fossa. Initial results were provided by telephone at the time of interpretation on 08/29/2021 at 10:20 am to provider [facility]who [facility] these results. Electronically Signed   By: PraMacy MisD.   On: 08/29/2021 10:44   IR ANGIO VERTEBRAL SEL SUBCLAVIAN INNOMINATE UNI L MOD SED  Result Date: 09/02/2021 CLINICAL DATA:  History of progressive onset of dysarthria, nausea, vomiting and left-sided numbness. Patient also developed diplopia. 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 EXAM: INTRACRANIAL STENT (INCL PTA) COMPARISON:  CT angiogram of the head and neck of August 29, 2021, and MRI of the brain of August 29, 2021. MEDICATIONS: Heparin 4,000 units IV. Ancef 2 g IV antibiotic was administered within 1 hour of the procedure. ANESTHESIA/SEDATION: Mac anesthesia as per Department of Anesthesiology for diagnostic portion followed by general anesthesia for endovascular treatment portion. CONTRAST:  Omnipaque 300 approximately 150 mL. FLUOROSCOPY TIME:  Fluoroscopy Time: 41 minutes 18 seconds (2310 mGy). COMPLICATIONS: None immediate. TECHNIQUE: Informed written consent was obtained from the patient after a thorough discussion of the procedural risks, benefits and alternatives. All questions were addressed. Maximal Sterile Barrier Technique was utilized including caps, mask, sterile gowns, sterile gloves, sterile drape, hand hygiene and skin antiseptic. A timeout was performed prior to the initiation of the procedure. The right forearm to the wrist was prepped and draped in the usual sterile manner. The right radial artery was then identified with ultrasound and its morphology documented. A dorsal palmar  anastomosis was verified to be present. Using ultrasound guidance, and a micropuncture set access into the right radial artery was obtained with a 6/7 French radial sheath. The obturator, and the guidewire were removed. Good aspiration was obtained from the side port of the sheath. Cocktail of 2000 units of heparin, 2.5 mg of verapamil, and 200 mcg of nitroglycerin was then infused through the sheath in diluted form without event. A right radial arteriogram was performed. Over a 0.035 inch Roadrunner guidewire, a 5 Fre[place]ons 2 diagnostic catheter was advanced to the aortic arch region, and arteriograms were performed of the right common carotid artery, the left common carotid artery, the left vertebral artery, and the right subclavian artery at the origin of stump of the occluded right vertebral artery. FINDINGS: The diagnostic study was somewhat marred by persistent motion artifact. The left common carotid arteriogram demonstrates the left external carotid artery and its major branches to be widely patent. The left internal carotid artery at the bulb demonstrates approximately 50% stenosis without evidence of intraluminal filling defects. More distally, the left internal carotid artery is seen  to opacify to the cranial skull base. 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 A left posterior communicating artery is seen opacifying the left posterior cerebral artery distribution. The left middle cerebral artery and the left anterior cerebral artery opacify into the capillary and venous phases. Non dominant left vertebral artery demonstrates patency at its origin with brisk ascent to the cranial skull base to supply primarily the ipsilateral left posteroinferior cerebellar artery. The right common carotid arteriogram demonstrates the right external carotid artery and its major branches to be widely  patent. The right internal carotid artery at the bulb has approximately 90% stenosis due to a circumferential smooth plaque. More distally, the vessel is seen to opacify to the cranial skull base. The petrous, the cavernous and the supraclinoid segments are widely patent. A right posterior communicating artery is seen opacifying the right posterior cerebral artery distribution. The right middle and the right anterior cerebral arteries opacify into the capillary and venous phases. The right subclavian arteriogram at the origin of the occluded right vertebral artery demonstrates partial reconstitution of the right vertebral artery at the level of C3-C4 from collaterals arising from the cervical branch of the thyrocervical trunk with opacification noted more distally into the dominant right vertebrobasilar junction. Opacification is noted into the basilar artery, and retrogradely in the right vertebral artery to just above the site of the occlusion in the proximal right vertebral artery. ENDOVASCULAR REVASCULARIZATION OF OCCLUDED DOMINANT PROXIMAL RIGHT VERTEBRAL ARTERY WITH STENT ASSISTED ANGIOPLASTY, AND OF THE MID BASILAR ARTERY WITH STENT ASSISTED ANGIOPLASTY Over the exchange 035 inch 300 cm Rosen exchange guidewire, the combination of a 95 cm Benchmark guide catheter with a support 5.5 [place] Berenstein catheter was advanced and positioned just proximal to the stump of the occluded dominant right vertebral artery. The guidewire and the support catheter were gently retrieved and removed. Good aspiration was obtained from the hub of the Benchmark guide catheter. Bilateral roadmap was then obtained centered over the vertebral artery origin and distally through the [facility] guide catheter. Over an 0.014 inch standard Synchro micro guidewire with a moderate J configuration, inside of an 021 160 cm Trevo Trak microcatheter combination was advanced to just proximal to the occluded dominant right vertebral artery. Using  a torque device with gentle manipulation access was obtained with the micro guidewire through the occluded right vertebral artery without difficulty. The guidewire was advanced to the vertebral artery followed by the microcatheter. The guidewire was removed. Good aspiration obtained from the hub of microcatheter. A control arteriogram was then performed through the microcatheter distally with opacification noted in the right vertebrobasilar junction, the basilar artery, the right posterior cerebral artery, the superior cerebellar arteries and the anterior-inferior cerebellar arteries. Also noted was significant narrowing secondary to an eccentric arteriosclerotic plaque in the mid basilar artery just proximal to the origin of right anterior inferior cerebellar artery. The microcatheter was then exchanged under constant fluoroscopic guidance for an 014 inch 300 cm Synchro standard micro guidewire with a moderate J configuration at its tip. Measurements were then performed of the most proximal portion of the right vertebral artery from the previous diagnostic arteriogram. It was decided to proceed with initial balloon angioplasty of the origin of the occluded right vertebral artery with a 2.5 mm x 15 mm Apex Monorail angioplasty balloon. This was prepped and purged antegradely with heparinized saline infusion, and retrogradely with 75% contrast and 25% heparinized saline infusion.  Using rapid exchange technique, this was then advanced and positioned at the origin of the occluded right vertebral artery with the proximal marker proximal to the origin of the right vertebral artery. Control inflation was then performed using micro inflation syringe device via micro tubing to approximately 8 atmospheres where it was maintained for approximately 15 seconds. The balloon was deflated and retrieved and removed. Control arteriogram performed through [facility] guide catheter demonstrated significantly improved caliber and flow  through the angioplastied segment with copious flow noted intracranially. 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 Patency was noted of the posterior cerebral arteries, the superior cerebellar arteries and faintly the anterior-inferior cerebellar arteries bilaterally. Significantly improved caliber was also noted at the origin of the right vertebral artery. A 120 cm Phenom Plus catheter was then advanced over the exchange micro guidewire under fluoroscopic guidance and positioned in the distal right vertebral artery. The Benchmark catheter was now advanced without difficulty into the distal right vertebral artery also. A roadmap was then obtained. Using a torque device, the exchange micro guidewire was safely advanced under fluoroscopic guidance into the proximal basilar artery and then the distal basilar artery into the right posterior cerebral artery P1 segment. Measurements were then performed of the basilar artery distal and proximal to the prominent stenosis. It was decided to proceed with placement of a Resolute Onyx 3 mm x 15 mm balloon mounted stent intracranially. However, the stented stent delivery apparatus could not be advanced through the 045 Phenom Plus catheter. The Phenom Plus catheter was removed with the wire maintained in the right P1 segment. The Benchmark guide catheter was easily then advanced into the right vertebrobasilar junction just proximal to the basilar artery. The Resolute Onyx which had been prepped with heparinized saline infusion retrogradely and angiographically with 60% contrast and 40% heparinized saline infusion was reintroduced and advanced without difficulty over the exchange wire to the distal end of the Benchmark guide catheter. This stent apparatus was easily advanced and positioned with the proximal and the distal markers adequate distant from the site of the significant mid basilar artery stenosis.  The stent was then deployed by inflating balloon using a micro inflation syringe device via micro tubing. Balloon was expanded to approximately 2.9 mm. The balloon was then deflated and retrieved. A control arteriogram performed through the Benchmark guide catheter demonstrated excellent apposition and flow through the stented mid basilar artery. Patency was seen of the posterior cerebral arteries, the superior cerebellar arteries and faintly the anterior-inferior cerebellar arteries. The Benchmark catheter was then retrieved more proximally while the exchange wire was retrieved into the V4 segment of the right vertebral artery. The Benchmark catheter was retrieved to just proximal to the origin of the right vertebral artery. Measurements were again performed of the vertebral artery at this site. It was decided to proceed with placement of a 3.5 mm x 18 mm Onyx Frontier balloon mountable stent. After having been prepped in the usual manner, this was then advanced and positioned such that the proximal portion of the device was just proximal to the origin of the right vertebral artery. Thereafter, controlled inflation was performed of the balloon mountable stent to approximately 8 atmospheres. This was maintained for approximately 10 seconds. The balloon was then deflated and retrieved and removed. A control arteriogram performed through the Benchmark guide catheter demonstrated excellent apposition and coverage of the previously occluded right vertebral artery origin and proximal portion. Free flow was now noted through  the proximal stent into the intracranial portion with patency maintained of the positioned mid basilar artery stent. Control arteriograms were then performed at 15 and 25 minutes post deployment of the devices. These continued to demonstrate excellent apposition with flow extra cranially and intracranially. The patient at this time was also given approximately 4 mg of intra-arterial Integrilin in  order to prevent formation of platelet aggregation at the stented site. None was obvious on diagnostic catheter arteriograms. The exchange guidewire was then retrieved and removed. A final control arteriogram performed through the Benchmark catheter and just proximal to the right vertebral artery origin continued to demonstrate excellent flow through the proximal stent and extra cranially and intracranially. There continued to be excellent flow through the basilar artery into the posterior cerebral arteries, the superior cerebellar arteries with faint opacification of both anterior-inferior cerebellar arteries. The Benchmark guide sheath was removed. The radial sheath was then removed with successful hemostasis with a wrist band at the right radial puncture site. Distal right radial pulse was verified to be present. A CT of the brain performed on the table demonstrated no evidence of hemorrhage or hydrocephalus. The patient was then extubated. Upon recovery, the patient was able to converse appropriately. He denied any nausea, vomiting or headaches. He was able to move all 4 extremities spontaneously and to command. His pupils were 2 mm equal and sluggish. No change was seen in the right facial droop. The tongue remained in the midline. The patient was then returned to his room in the ICU in stable condition to continue on low-dose IV heparin and 81 aspirin daily, and Brilinta 90 mg b.i.d. Early in the evening, the patient was more awake, alert and appropriately responsive. He continued to have dysarthria with right facial droop. Neurological examination continued to have non sustained nystagmus on right lateral gaze and to a lesser degree left lateral gaze with oscillopsia on the right lateral gaze unchanged. IMPRESSION: Status post endovascular revascularization of occluded dominant right vertebral artery at its origin and proximally with stent assisted angioplasty, and of significant stenosis in the mid basilar  artery with stent assisted angioplasty as described above. PLAN: Follow-up in clinic 2 weeks post discharge. Electronically Signed   By: Luanne Bras M.D.   On: 09/02/2021 05:39   IR ANGIO VERTEBRAL SEL SUBCLAVIAN INNOMINATE UNI R MOD SED  Result Date: 09/02/2021 CLINICAL DATA:  History of progressive onset of dysarthria, nausea, vomiting and left-sided numbness. Patient also developed diplopia. 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 EXAM: INTRACRANIAL STENT (INCL PTA) COMPARISON:  CT angiogram of the head and neck of August 29, 2021, and MRI of the brain of August 29, 2021. MEDICATIONS: Heparin 4,000 units IV. Ancef 2 g IV antibiotic was administered within 1 hour of the procedure. ANESTHESIA/SEDATION: Mac anesthesia as per Department of Anesthesiology for diagnostic portion followed by general anesthesia for endovascular treatment portion. CONTRAST:  Omnipaque 300 approximately 150 mL. FLUOROSCOPY TIME:  Fluoroscopy Time: 41 minutes 18 seconds (2310 mGy). COMPLICATIONS: None immediate. TECHNIQUE: Informed written consent was obtained from the patient after a thorough discussion of the procedural risks, benefits and alternatives. All questions were addressed. Maximal Sterile Barrier Technique was utilized including caps, mask, sterile gowns, sterile gloves, sterile drape, hand hygiene and skin antiseptic. A timeout was performed prior to the initiation of the procedure. The right forearm to the wrist was  prepped and draped in the usual sterile manner. The right radial artery was then identified with ultrasound and its morphology documented. A dorsal palmar anastomosis was verified to be present. Using ultrasound guidance, and a micropuncture set access into the right radial artery was obtained with a 6/7 French radial sheath.  The obturator, and the guidewire were removed. Good aspiration was obtained from the side port of the sheath. Cocktail of 2000 units of heparin, 2.5 mg of verapamil, and 200 mcg of nitroglycerin was then infused through the sheath in diluted form without event. A right radial arteriogram was performed. Over a 0.035 inch Roadrunner guidewire, a 5 [place] Simmons 2 diagnostic catheter was advanced to the aortic arch region, and arteriograms were performed of the right common carotid artery, the left common carotid artery, the left vertebral artery, and the right subclavian artery at the origin of stump of the occluded right vertebral artery. FINDINGS: The diagnostic study was somewhat marred by persistent motion artifact. The left common carotid arteriogram demonstrates the left external carotid artery and its major branches to be widely patent. The left internal carotid artery at the bulb demonstrates approximately 50% stenosis without evidence of intraluminal filling defects. More distally, the left internal carotid artery is seen to opacify to the cranial skull base. 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 A left posterior communicating artery is seen opacifying the left posterior cerebral artery distribution. The left middle cerebral artery and the left anterior cerebral artery opacify into the capillary and venous phases. Non dominant left vertebral artery demonstrates patency at its origin with brisk ascent to the cranial skull base to supply primarily the ipsilateral left posteroinferior cerebellar artery. The right common carotid arteriogram demonstrates the right external carotid artery and its major branches to be widely patent. The right internal carotid artery at the bulb has approximately 90% stenosis due to a circumferential smooth plaque. More distally, the vessel is seen to opacify to  the cranial skull base. The petrous, the cavernous and the supraclinoid segments are widely patent. A right posterior communicating artery is seen opacifying the right posterior cerebral artery distribution. The right middle and the right anterior cerebral arteries opacify into the capillary and venous phases. The right subclavian arteriogram at the origin of the occluded right vertebral artery demonstrates partial reconstitution of the right vertebral artery at the level of C3-C4 from collaterals arising from the cervical branch of the thyrocervical trunk with opacification noted more distally into the dominant right vertebrobasilar junction. Opacification is noted into the basilar artery, and retrogradely in the right vertebral artery to just above the site of the occlusion in the proximal right vertebral artery. ENDOVASCULAR REVASCULARIZATION OF OCCLUDED DOMINANT PROXIMAL RIGHT VERTEBRAL ARTERY WITH STENT ASSISTED ANGIOPLASTY, AND OF THE MID BASILAR ARTERY WITH STENT ASSISTED ANGIOPLASTY Over the exchange 035 inch 300 cm Rosen exchange guidewire, the combination of a 95 cm Benchmark guide catheter with a support 5.5 [place] Berenstein catheter was advanced and positioned just proximal to the stump of the occluded dominant right vertebral artery. The guidewire and the support catheter were gently retrieved and removed. Good aspiration was obtained from the hub of the Benchmark guide catheter. Bilateral roadmap was then obtained centered over the vertebral artery origin and distally through the [facility] guide catheter. Over an 0.014 inch standard Synchro micro guidewire with a moderate J configuration, inside of an 021 160 cm Trevo Trak microcatheter combination was advanced  to just proximal to the occluded dominant right vertebral artery. Using a torque device with gentle manipulation access was obtained with the micro guidewire through the occluded right vertebral artery without difficulty. The guidewire was  advanced to the vertebral artery followed by the microcatheter. The guidewire was removed. Good aspiration obtained from the hub of microcatheter. A control arteriogram was then performed through the microcatheter distally with opacification noted in the right vertebrobasilar junction, the basilar artery, the right posterior cerebral artery, the superior cerebellar arteries and the anterior-inferior cerebellar arteries. Also noted was significant narrowing secondary to an eccentric arteriosclerotic plaque in the mid basilar artery just proximal to the origin of right anterior inferior cerebellar artery. The microcatheter was then exchanged under constant fluoroscopic guidance for an 014 inch 300 cm Synchro standard micro guidewire with a moderate J configuration at its tip. Measurements were then performed of the most proximal portion of the right vertebral artery from the previous diagnostic arteriogram. It was decided to proceed with initial balloon angioplasty of the origin of the occluded right vertebral artery with a 2.5 mm x 15 mm Apex Monorail angioplasty balloon. This was prepped and purged antegradely with heparinized saline infusion, and retrogradely with 75% contrast and 25% heparinized saline infusion. Using rapid exchange technique, this was then advanced and positioned at the origin of the occluded right vertebral artery with the proximal marker proximal to the origin of the right vertebral artery. Control inflation was then performed using micro inflation syringe device via micro tubing to approximately 8 atmospheres where it was maintained for approximately 15 seconds. The balloon was deflated and retrieved and removed. Control arteriogram performed through [facility] guide catheter demonstrated significantly improved caliber and flow through the angioplastied segment with copious flow noted intracranially. Intracranially again demonstrated was significant mid basilar artery stenosis secondary to a  soft eccentric plaque. Patency was noted of the posterior cerebral arteries, the superior cerebellar arteries and faintly the anterior-inferior cerebellar arteries bilaterally. Significantly improved caliber was also noted at the origin of the right vertebral artery. A 120 cm Phenom Plus catheter was then advanced over the exchange micro guidewire under fluoroscopic guidance and positioned in the distal right vertebral artery. The Benchmark catheter was now advanced without difficulty into the distal right vertebral artery also. A roadmap was then obtained. Using a torque device, the exchange micro guidewire was safely advanced under fluoroscopic guidance into the proximal basilar artery and then the distal basilar artery into the right posterior cerebral artery P1 segment. Measurements were then performed of the basilar artery distal and proximal to the prominent stenosis. It was decided to proceed with placement of a Resolute Onyx 3 mm x 15 mm balloon mounted stent intracranially. However, the stented stent delivery apparatus could not be advanced through the 045 Phenom Plus catheter. The Phenom Plus catheter was removed with the wire maintained in the right P1 segment. The Benchmark guide catheter was easily then advanced into the right vertebrobasilar junction just proximal to the basilar artery. The Resolute Onyx which had been prepped with heparinized saline infusion retrogradely and angiographically with 60% contrast and 40% heparinized saline infusion was reintroduced and advanced without difficulty over the exchange wire to the distal end of the Benchmark guide catheter. This stent apparatus was easily advanced and positioned with the proximal and the distal markers adequate distant from the site of the significant mid basilar artery stenosis. The stent was then deployed by inflating balloon using a micro inflation syringe device via micro tubing. Balloon was  expanded to approximately 2.9 mm. The balloon  was then deflated and retrieved. A control arteriogram performed through the Benchmark guide catheter demonstrated excellent apposition and flow through the stented mid basilar artery. Patency was seen of the posterior cerebral arteries, the superior cerebellar arteries and faintly the anterior-inferior cerebellar arteries. The Benchmark catheter was then retrieved more proximally while the exchange wire was retrieved into the V4 segment of the right vertebral artery. The Benchmark catheter was retrieved to just proximal to the origin of the right vertebral artery. Measurements were again performed of the vertebral artery at this site. It was decided to proceed with placement of a 3.5 mm x 18 mm Onyx Frontier balloon mountable stent. After having been prepped in the usual manner, this was then advanced and positioned such that the proximal portion of the device was just proximal to the origin of the right vertebral artery. Thereafter, controlled inflation was performed of the balloon mountable stent to approximately 8 atmospheres. This was maintained for approximately 10 seconds. The balloon was then deflated and retrieved and removed. A control arteriogram performed through the Benchmark guide catheter demonstrated excellent apposition and coverage of the previously occluded right vertebral artery origin and proximal portion. Free flow was now noted through the proximal stent into the intracranial portion with patency maintained of the positioned mid basilar artery stent. Control arteriograms were then performed at 15 and 25 minutes post deployment of the devices. These continued to demonstrate excellent apposition with flow extra cranially and intracranially. The patient at this time was also given approximately 4 mg of intra-arterial Integrilin in order to prevent formation of platelet aggregation at the stented site. None was obvious on diagnostic catheter arteriograms. The exchange guidewire was then retrieved  and removed. A final control arteriogram performed through the Benchmark catheter and just proximal to the right vertebral artery origin continued to demonstrate excellent flow through the proximal stent and extra cranially and intracranially. There continued to be excellent flow through the basilar artery into the posterior cerebral arteries, the superior cerebellar arteries with faint opacification of both anterior-inferior cerebellar arteries. The Benchmark guide sheath was removed. The radial sheath was then removed with successful hemostasis with a wrist band at the right radial puncture site. Distal right radial pulse was verified to be present. A CT of the brain performed on the table demonstrated no evidence of hemorrhage or hydrocephalus. The patient was then extubated. Upon recovery, the patient was able to converse appropriately. He denied any nausea, vomiting or headaches. He was able to move all 4 extremities spontaneously and to command. His pupils were 2 mm equal and sluggish. No change was seen in the right facial droop. The tongue remained in the midline. The patient was then returned to his room in the ICU in stable condition to continue on low-dose IV heparin and 81 aspirin daily, and Brilinta 90 mg b.i.d. Early in the evening, the patient was more awake, alert and appropriately responsive. He continued to have dysarthria with right facial droop. Neurological examination continued to have non sustained nystagmus on right lateral gaze and to a lesser degree left lateral gaze with oscillopsia on the right lateral gaze unchanged. 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 PLAN: Follow-up in clinic 2 weeks post discharge. Electronically Signed   By: Luanne Bras M.D.   On:  09/02/2021 05:39     PHYSICAL EXAM  Temp:  [97.6 F (36.4 C)-98.6 F (37 C)] 98.2 F (36.8 C) (11/08 0815) Pulse Rate:  [71-92] 86 (11/08 0815) Resp:  [14-18] 16 (11/08 0815) BP: (131-164)/(75-100) 131/75 (11/08 0815) SpO2:  [95 %-100 %] 98 % (11/08 0815)  General - Well nourished, well developed, pleasant middle-age Caucasian male alert.  Not in any distress.  Cardiovascular - Regular rhythm and rate.   NEURO:  Mental Status: AA&Ox3. Can hold a conversation. Speech/Language: mild dysarthria  Naming, repetition, fluency, and comprehension intact.  Cranial Nerves:  II: PERRL. Diplopia persists esp on right lateral gaze. There is nystagmus noted on right lateral gaze. Right INO III, IV, VI: EOMI. Eyelids elevate symmetrically.  V: Patient c/o numbness to face bilaterally but can perceive light touch  VII: Right facial droop present VIII: hearing intact to voice on left, right sided hearing loss persists to finger rub. IX, X: Phonation is normal.  XII: without fasciculations. Speech is dysarthric but intelligible.  Motor: LUE/ LLE 5/5, RUE and RLE 4/5 Tone: is normal and bulk is normal Sensation- Intact to light touch on right, left sided hemibody numbness persists.  Coordination: FTN intact on left with right sided ataxia mild to moderate, HKS: no ataxia in LE.No drift.  Gait- deferred  ASSESSMENT/PLAN [name] is a 65 y.o. male with no known medical history admitted for left-sided numbness, dizziness, headache diplopia.  Developed right facial droop, slurred speech and right hearing loss overnight.  No tPA given due to outside window. Stenting of basilar and vertebral arteries was performed. Repeat MRI post procedure demonstrates worsening and new infarcts. Urinary retention continues, will continue flomax and mobilize patient.  Patient will be discharged to CIR and will return in approximately 6 weeks for stenting of the right carotid artery.  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 Cardioembolic etiology less likely  Resultant right peripheral facial droop (CN VII), right lateral gaze incomplete (CN VI), right hearing loss (CN VIII and AICA territory), left sided numbness (right spinal lemniscus and right trigeminothalamic tract), right cerebellum (right PICA and AICA) CT showed right cerebellar infarct CT head and neck right VA origin occlusion, reconstituted at distal V2.  Right PICA occlusion, mid basilar artery high-grade stenosis versus thrombosis, right ICA 65% stenosis, left VA ends at PICA MRI right cerebellar infarct, and right lower pontine infarct. IR right VA origin, mid to basilar artery prominent stenosis, status post stenting in both arteries.  Severe 90% stenosis right ICA proximal, 50% stenosis left ICA proximal. MRI repeat 11/5 demonstrates increased size of right cerebellar infarct, new patchy right cerebellar infarcts in AICA territors, new infarcts in right middle cerebellar peduncle, right medulla, right and left pons, new punctate infarcts in left parietal and occipital lobes. 2D Echo EF 60 to 65% TEE performed 11/4, LVEF 60-65%No LA/LAAthrombus or mass.+PFO with R-->L shuntingAtherosclerosis of the descending aorta. Recommend 30 day cardiac event monitoring as outpt to rule out afib LDL 220 HgbA1c 5.5 SCDs for DVT prophylaxis No antithrombotic prior to admission, now on aspirin 81 mg daily, Brilinta (ticagrelor) 90 mg bid, Patient counseled to be compliant with his antithrombotic medications Ongoing aggressive stroke risk factor management Therapy recommendations: CIR Disposition: Pending  Carotid stenosis, bilateral CTA head and neck showed right ICA  65% stenosis Cerebral angiogram showed right ICA proximal 90% stenosis, left ICA proximal 50%  stenosis Dr. Estanislado Pandy plan for right ICA stenting in the near future likely in 6 weeks after rehab stay.  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 Recheck BMP in am  Leukocytosis WBC 12.2-> 13.6-> 10.8->13.6.   - afebrile will monitor  I have personally obtained history,examined this patient, reviewed notes, independently viewed imaging studies, participated in medical decision making and plan of care.ROS completed by me personally and pertinent positives fully documented  I have made any additions or clarifications directly to the above note. Agree with note above.  Patient is medically stable to be transferred to inpatient rehab later today when bed available.  Antony Contras, MD Medical Director [facility] Pager: [phone] 09/04/2021 5:08 PM

## 2021-09-04 NOTE — Progress Notes (Addendum)
Physical Therapy Treatment Patient Details Name: Maurice Little MRN: [id number] DOB: [birth year] Today's Date: 09/04/2021   History of Present Illness 65 y/o male presented to ED on 11/2 for R facial droop, R sided weakness, headache, and dizziness. 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 TEE 11/4. PMH: HTN, HLD, PVD    PT Comments    Pt received in supine with wife, Scherry Ran, present and pt agreeable to therapy session. Pt able to perform seated exercises with frequent cues to slow down as he tends to go as quickly as he can, explained benefit of slower eccentric motions for strengthening. Pt c/o dizziness upon standing and became diaphoretic with pale pallor, MD notified (MD entering room at end of session). Pt instructed on exercises/positioning to improve hemodynamics prior to transfers in the future. Returned to sitting and checked VS, see below table: Orthostatic VS for the past 24 hrs (Last 3 readings):  BP- Lying Pulse- Lying BP- Sitting Pulse- Sitting Pulse- Standing at 0 minutes  09/04/21 1128 117/80 (after standing) 83 -- -- --  09/04/21 1100 -- -- (!) 76/57 (after standing) 81 121  Continue to recommend CIR upon DC. Pt continues to benefit from PT services to progress toward functional mobility goals.        Recommendations for follow up therapy are one component of a multi-disciplinary discharge planning process, led by the attending physician.  Recommendations may be updated based on patient status, additional functional criteria and insurance authorization.  Follow Up Recommendations  Acute inpatient rehab (3hours/day)     Assistance Recommended at Discharge Frequent or constant Supervision/Assistance  Equipment Recommendations       Recommendations for Other Services Rehab consult     Precautions / Restrictions Precautions Precautions:  Fall Precaution Comments: diplopia, Ataxic R; L side numbness Restrictions Weight Bearing Restrictions: No     Mobility  Bed Mobility Overal bed mobility: Needs Assistance Bed Mobility: Supine to Sit;Sit to Supine    Supine to sit: Min assist Sit to supine: Min assist   General bed mobility comments: Up to minA for BLE management    Transfers Overall transfer level: Needs assistance Equipment used: Rolling walker (2 wheels) Transfers: Sit to/from Stand Sit to Stand: Mod assist;+2 physical assistance     General transfer comment: up to modA +2 for power up, steadying, and max cues for hand placement    Ambulation/Gait  General Gait Details: Deferred today due to orthostatic vitals upon standing       Modified Rankin (Stroke Patients Only) Modified Rankin (Stroke Patients Only) Pre-Morbid Rankin Score: No symptoms Modified Rankin: Moderately severe disability     Balance Overall balance assessment: Needs assistance Sitting-balance support: Bilateral upper extremity supported;Feet supported Sitting balance-Leahy Scale: Fair   Postural control: Right lateral lean Standing balance support: Bilateral upper extremity supported;During functional activity;Reliant on assistive device for balance Standing balance-Leahy Scale: Poor Standing balance comment: reliant on external assist to maintain standing balance      Cognition Arousal/Alertness: Awake/alert Behavior During Therapy: Flat affect Overall Cognitive Status: Impaired/Different from baseline Area of Impairment: Safety/judgement;Memory     Current Attention Level: Selective Memory: Decreased short-term memory;Decreased recall of precautions Following Commands: Follows one step commands with increased time;Follows one step commands inconsistently Safety/Judgement: Decreased awareness of safety Awareness: Emergent Problem Solving: Slow processing;Requires verbal cues;Requires tactile cues General Comments:  Pt is aware of deficits and need  for rest breaks        Exercises Other Exercises Other Exercises: Seated: heel raise/toe raise x5, LAQ x5, marching x10 (Frequent cues to slow down and control the motions) Other Exercises: Supine: ankle pumps x5 Also instructed on seated/supine BUE chest press x10 prior to sitting EOB and prior to standing to improve BP/it may help prevent symptoms of orthostatic hypotension.   General Comments General comments (skin integrity, edema, etc.): See vital signs flowsheet for orthostatic vitals      Pertinent Vitals/Pain Pain Assessment: No/denies pain Faces Pain Scale: No hurt     PT Goals (current goals can now be found in the care plan section) Acute Rehab PT Goals Patient Stated Goal: to get better PT Goal Formulation: With patient/family Time For Goal Achievement: 09/14/21 Progress towards PT goals: Progressing toward goals    Frequency    Min 4X/week      PT Plan Current plan remains appropriate       AM-PAC PT "6 Clicks" Mobility   Outcome Measure  Help needed turning from your back to your side while in a flat bed without using bedrails?: A Little Help needed moving from lying on your back to sitting on the side of a flat bed without using bedrails?: A Little Help needed moving to and from a bed to a chair (including a wheelchair)?: A Little Help needed standing up from a chair using your arms (e.g., wheelchair or bedside chair)?: A Lot Help needed to walk in hospital room?: A Lot Help needed climbing 3-5 steps with a railing? : Total 6 Click Score: 14    End of Session Equipment Utilized During Treatment: Gait belt Activity Tolerance: Patient tolerated treatment well Patient left: in bed;with bed alarm set;with family/visitor present;with call bell/phone within reach (MD's entered room upon therpist exit) Nurse Communication: Mobility status PT Visit Diagnosis: Unsteadiness on feet (R26.81);Muscle weakness (generalized)  (M62.81);Other abnormalities of gait and mobility (R26.89);Dizziness and giddiness (R42)     Time: 8937-3428 PT Time Calculation (min) (ACUTE ONLY): 29 min  Charges:  $Therapeutic Exercise: 8-22 mins $Therapeutic Activity: 8-22 mins                     Harland German, Student PTA CI: Carly P., PTA  Carly M Poff 09/04/2021, 1:21 PM

## 2021-09-04 NOTE — Progress Notes (Signed)
Hand-off report given to RN at .

## 2021-09-04 NOTE — Progress Notes (Signed)
Inpatient Rehab Admissions Coordinator:    I have a CIR bed for this Pt. RN may call report to [phone] after 12pm.  Megan Salon, MS, CCC-SLP Rehab Admissions Coordinator  [phone] (celll) [phone] (office)

## 2021-09-04 NOTE — H&P (Signed)
Physical Medicine and Rehabilitation Admission H&P    Chief Complaint  Patient presents with   Stroke with functional deficits.     HPI: Maurice Little is a 65 year old RH-male with history of HTN, PAD who was admitted on 08/29/21 with onset of dizziness and headaches approximately day prior to admission followed by left-sided tingling for 2 hours the next day.  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  CTA head/neck showed atherosclerosis with question of high-grade stenosis or nonocclusive clot in basilar artery.  He was loaded with DAPT and started on IV heparin.  He developed right facial droop with right hearing loss, diplopia with nystagmus as well as slurred speech with nausea evening past admission.  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 Corliss Skains.  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  Postprocedure to be on aspirin and Brilinta for at least a year.  BLE Dopplers were negative for DVT.  Cardiology was consulted for input and he underwent TEE revealing LVEF 60-65%, no LAA thrombus, PFO + R-->L shunt and atherosclerosis of the descending aorta.  MRI brain done revealing acute infarct in right cerebellar hemisphere and small additional infarcts in right brachium pontis and right pontomedullary junction.  Dr. Pearlean Brownie felt that stroke was most likely secondary to atherosclerosis with R-severe occlusion and PA high-grade stenosis.  Recommendations are for 30-day cardiac event monitor on outpatient basis to rule out A. fib.  Dr. Corliss Skains plans right ICA stenting in approximately 6 weeks.  MBS done showing mild oral dysphagia and dysphagia 3 with thin liquids recommended.  He has also had issues with urinary retention requiring placement of Foley on 11/06.  Patient continues to be limited by balance deficits with RLE incoordination, left-sided  numbness, diplopia as well as orthostatic changes today with BP 76/56 after standing.  Therapy ongoing and CIR recommended due to functional decline.  Cannot feel on L side- but has good strength.  Also has diplopia-  Foley was placed yesterday due to urinary retention.   Also has dysarthria.  NO BM since last Wednesday- feels constipation.     Review of Systems  Constitutional:  Negative for chills and fever.  HENT:  Positive for hearing loss (on the right). Negative for tinnitus.   Eyes:  Positive for double vision. Negative for pain.  Respiratory:  Negative for cough and shortness of breath.   Cardiovascular:  Negative for chest pain and palpitations.  Gastrointestinal:  Positive for constipation (has not had BM since admission). Negative for abdominal pain and heartburn.  Musculoskeletal:  Negative for myalgias.  Skin:  Negative for rash.  Neurological:  Positive for dizziness, sensory change, speech change, focal weakness and weakness. Negative for headaches.  Psychiatric/Behavioral:  Negative for hallucinations.   All other systems reviewed and are negative.   Past Medical History:  Diagnosis Date   Hypertension    PAD (peripheral artery disease) (HCC)     Past Surgical History:  Procedure Laterality Date   BUBBLE STUDY  08/31/2021   Procedure: BUBBLE STUDY;  Surgeon: Chilton Si, MD;  Location: [facility] ENDOSCOPY;  Service: Cardiovascular;;   IR ANGIO VERTEBRAL SEL SUBCLAVIAN INNOMINATE UNI L MOD SED  08/30/2021   IR ANGIO VERTEBRAL SEL SUBCLAVIAN INNOMINATE UNI R MOD SED  08/30/2021   IR CT HEAD LTD  08/30/2021   IR INTRA CRAN STENT  08/30/2021  IR US GUIDE VASC ACCESS RIGHT  08/30/2021   RADIOLOGY WITH ANESTHESIA N/A 08/30/2021   Procedure: IR WITH ANESTHESIA;  Surgeon: Julieanne Cotton, MD;  Location: [facility] OR;  Service: Radiology;  Laterality: N/A;   TEE WITHOUT CARDIOVERSION N/A 08/31/2021   Procedure: TRANSESOPHAGEAL ECHOCARDIOGRAM (TEE);  Surgeon: Chilton Si,  MD;  Location: [facility] ENDOSCOPY;  Service: Cardiovascular;  Laterality: N/A;    Family History  Problem Relation Age of Onset   Healthy Mother     Social History: Married. Works in Consulting civil engineer for [employer] and recently moved from [place]. Per  reports that he has quit smoking. His smoking use included cigarettes. He has never used smokeless tobacco. He reports that he does not currently use alcohol. No history on file for drug use.   Allergies  Allergen Reactions   Duloxetine Tinitus    Other reaction(s): Other (See Comments) Dizziness Dizziness, tinnitus     Medications Prior to Admission  Medication Sig Dispense Refill   Acetylcysteine (NAC PO) Take 1 tablet by mouth daily.     aspirin EC 81 MG tablet Take 81 mg by mouth daily. Swallow whole.     Cyanocobalamin (B-12 PO) Take 1 tablet by mouth daily.     ibuprofen (ADVIL) 200 MG tablet Take 400 mg by mouth every 6 (six) hours as needed for fever, headache or mild pain.     OVER THE COUNTER MEDICATION Take 1 tablet by mouth daily. Delta 3 gummie      Drug Regimen Review  Drug regimen was reviewed and remains appropriate with no significant issues identified  Home: Home Living Family/patient expects to be discharged to:: Private residence Living Arrangements: Spouse/significant other Available Help at Discharge: Family, Available 24 hours/day Type of Home: House Home Access: Stairs to enter [employer] of Steps: 3 Entrance Stairs-Rails: Right, Left Home Layout: One level Bathroom Shower/Tub: Health visitor: Administrator Accessibility: Yes Home Equipment: Shower seat - built in, Higher education careers adviser held shower head  Lives With: Spouse   Functional History: Prior Function Prior Level of Function : Independent/Modified Independent, Working/employed, 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, early years/pre) 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 Status:  Mobility: Bed Mobility Overal bed mobility: Needs  Assistance Bed Mobility: Supine to Sit, Sit to Supine Supine to sit: Min assist Sit to supine: Min assist General bed mobility comments: Up to minA for BLE management Transfers Overall transfer level: Needs assistance Equipment used: Rolling walker (2 wheels) Transfers: Sit to/from Stand Sit to Stand: Mod assist, +2 physical assistance Stand pivot transfers: Total assist Transfer via Lift Equipment: Stedy General transfer comment: up to modA +2 for power up, steadying, and max cues for hand placement Ambulation/Gait Ambulation/Gait assistance: Mod assist, +2 physical assistance Gait Distance (Feet): 5 Feet Assistive device: 2 person hand held assist Gait Pattern/deviations: Step-through pattern, Decreased stride length, Ataxic, Drifts right/left, Wide base of support, Leaning posteriorly General Gait Details: Deferred today due to orthostatic vitals upon standing Gait velocity: decr Pre-gait activities: worked on erect posture and wt shifting to maintain midline    ADL: ADL Overall ADL's : Independent Eating/Feeding: NPO Grooming: Maximal assistance Grooming Details (indicate cue type and reason): brushing teeth with toothette and suction; difficulty controlling movement patterns Upper Body Bathing: Maximal assistance Lower Body Bathing: Maximal assistance, Sit to/from stand Upper Body Dressing : Maximal assistance, Total assistance Lower Body Dressing: Maximal assistance Lower Body Dressing Details (indicate cue type and reason): don socks Toilet Transfer: +2 for physical assistance, Rolling  walker (2 wheels), Moderate assistance Toilet Transfer Details (indicate cue type and reason): simulated EOB to chair. pt sit<>Stand from chair x3 during session. cues for anterior weight shift Toileting- Clothing Manipulation and Hygiene: Total assistance Functional mobility during ADLs: Maximal assistance, +2 for physical assistance General ADL Comments: pt progressed to static standing  this session with RW. shoes helping with proceoption of feet to anterior weight shift. pt progressed from two person hand held (A) to static standing mod (A) one person RW this session.  Cognition: Cognition Overall Cognitive Status: Impaired/Different from baseline Orientation Level: Oriented X4 Cognition Arousal/Alertness: Awake/alert Behavior During Therapy: Flat affect Overall Cognitive Status: Impaired/Different from baseline Area of Impairment: Safety/judgement, Memory Current Attention Level: Selective Memory: Decreased short-term memory, Decreased recall of precautions Following Commands: Follows one step commands with increased time, Follows one step commands inconsistently Safety/Judgement: Decreased awareness of safety Awareness: Emergent Problem Solving: Slow processing, Requires verbal cues, Requires tactile cues General Comments: Pt is aware of deficits and need for rest breaks   Blood pressure 117/74, pulse 85, temperature 98.4 F (36.9 C), temperature source Oral, resp. rate 16, height 6' (1.829 m), weight 92 kg, SpO2 97 %. Physical Exam Vitals and nursing note reviewed. Exam conducted with a chaperone present.  Constitutional:      Appearance: Normal appearance. He is normal weight.     Comments: Awake, alert, dysarthric; has tape over L eyeglass lens; NAD- wife at bedside  HENT:     Head: Normocephalic.     Comments: L eye slower- and dysconjugate gaze; decreased light touch on B/L side of face; tongue midline; R facial droop.      Right Ear: External ear normal.     Left Ear: External ear normal.     Nose: Nose normal.     Mouth/Throat:     Mouth: Mucous membranes are moist.     Pharynx: Oropharynx is clear. No oropharyngeal exudate.  Eyes:     General:        Right eye: No discharge.        Left eye: No discharge.     Comments: A lot of nystagmus  Cardiovascular:     Rate and Rhythm: Normal rate and regular rhythm.     Heart sounds: Normal heart sounds.  No murmur heard.   No gallop.  Pulmonary:     Effort: Pulmonary effort is normal. No respiratory distress.     Breath sounds: Normal breath sounds. No wheezing or rales.  Abdominal:     General: There is no distension.     Tenderness: There is no abdominal tenderness.     Comments: Abd a little firm; normoactive- NT; distended  Genitourinary:    Comments: Foley (+) Musculoskeletal:     Cervical back: Normal range of motion. No rigidity.     Comments: 5/5 in RUE/RLE/LUE/LLE  Skin:    Comments: R hand IV- look OK No skin breakdown seen  Neurological:     Mental Status: He is alert and oriented to person, place, and time.     Comments: Right facial weakness with flat,dysarthric speech. Diplopia worse on right with horizontal nystagmus--tape on left lens helping with correction. Ataxia with finger to nose Left>Right as well as decrease in LLE motor control. Sensory deficits left face, LUE and LLE.  Very mild slowed processing/but could be due to dysarthria.   Psychiatric:        Mood and Affect: Mood normal.        Behavior: Behavior  normal.    Results for orders placed or performed during the hospital encounter of 08/29/21 (from the past 48 hour(s))  Glucose, capillary     Status: None   Collection Time: 09/02/21  3:53 PM  Result Value Ref Range   Glucose-Capillary 94 70 - 99 mg/dL    Comment: Glucose reference range applies only to samples taken after fasting for at least 8 hours.  Glucose, capillary     Status: Abnormal   Collection Time: 09/02/21  8:05 PM  Result Value Ref Range   Glucose-Capillary 116 (H) 70 - 99 mg/dL    Comment: Glucose reference range applies only to samples taken after fasting for at least 8 hours.  Glucose, capillary     Status: Abnormal   Collection Time: 09/02/21 11:58 PM  Result Value Ref Range   Glucose-Capillary 105 (H) 70 - 99 mg/dL    Comment: Glucose reference range applies only to samples taken after fasting for at least 8 hours.  Basic  metabolic panel     Status: Abnormal   Collection Time: 09/03/21  2:14 AM  Result Value Ref Range   Sodium 135 135 - 145 mmol/L   Potassium 3.1 (L) 3.5 - 5.1 mmol/L   Chloride 102 98 - 111 mmol/L   CO2 26 22 - 32 mmol/L   Glucose, Bld 103 (H) 70 - 99 mg/dL    Comment: Glucose reference range applies only to samples taken after fasting for at least 8 hours.   BUN 15 8 - 23 mg/dL   Creatinine, Ser 7.25 0.61 - 1.24 mg/dL   Calcium 8.7 (L) 8.9 - 10.3 mg/dL   GFR, Estimated >36 >64 mL/min    Comment: (NOTE) Calculated using the CKD-EPI Creatinine Equation (2021)    Anion gap 7 5 - 15    Comment: Performed at [facility], [address], [place], [place] [address]  CBC     Status: Abnormal   Collection Time: 09/03/21  2:14 AM  Result Value Ref Range   WBC 13.6 (H) 4.0 - 10.5 K/uL   RBC 4.09 (L) 4.22 - 5.81 MIL/uL   Hemoglobin 12.8 (L) 13.0 - 17.0 g/dL   HCT 42.5 95.6 - 38.7 %   MCV 96.1 80.0 - 100.0 fL   MCH 31.3 26.0 - 34.0 pg   MCHC 32.6 30.0 - 36.0 g/dL   RDW 56.4 33.2 - 95.1 %   Platelets 267 150 - 400 K/uL   nRBC 0.0 0.0 - 0.2 %    Comment: Performed at [facility], [address], [place], [place] [address]  Glucose, capillary     Status: Abnormal   Collection Time: 09/03/21  3:43 AM  Result Value Ref Range   Glucose-Capillary 103 (H) 70 - 99 mg/dL    Comment: Glucose reference range applies only to samples taken after fasting for at least 8 hours.  Glucose, capillary     Status: Abnormal   Collection Time: 09/03/21  7:54 AM  Result Value Ref Range   Glucose-Capillary 103 (H) 70 - 99 mg/dL    Comment: Glucose reference range applies only to samples taken after fasting for at least 8 hours.  Glucose, capillary     Status: Abnormal   Collection Time: 09/03/21 11:51 AM  Result Value Ref Range   Glucose-Capillary 110 (H) 70 - 99 mg/dL    Comment: Glucose reference range applies only to samples taken after fasting for at least 8 hours.  Glucose, capillary  Status: Abnormal   Collection Time: 09/03/21  3:29 PM  Result Value Ref Range   Glucose-Capillary 104 (H) 70 - 99 mg/dL    Comment: Glucose reference range applies only to samples taken after fasting for at least 8 hours.  Glucose, capillary     Status: Abnormal   Collection Time: 09/03/21  7:52 PM  Result Value Ref Range   Glucose-Capillary 104 (H) 70 - 99 mg/dL    Comment: Glucose reference range applies only to samples taken after fasting for at least 8 hours.  Glucose, capillary     Status: Abnormal   Collection Time: 09/04/21 12:07 AM  Result Value Ref Range   Glucose-Capillary 113 (H) 70 - 99 mg/dL    Comment: Glucose reference range applies only to samples taken after fasting for at least 8 hours.  Basic metabolic panel     Status: Abnormal   Collection Time: 09/04/21  4:05 AM  Result Value Ref Range   Sodium 135 135 - 145 mmol/L   Potassium 4.0 3.5 - 5.1 mmol/L   Chloride 104 98 - 111 mmol/L   CO2 22 22 - 32 mmol/L   Glucose, Bld 110 (H) 70 - 99 mg/dL    Comment: Glucose reference range applies only to samples taken after fasting for at least 8 hours.   BUN 17 8 - 23 mg/dL   Creatinine, Ser 1.32 0.61 - 1.24 mg/dL   Calcium 8.8 (L) 8.9 - 10.3 mg/dL   GFR, Estimated >44 >01 mL/min    Comment: (NOTE) Calculated using the CKD-EPI Creatinine Equation (2021)    Anion gap 9 5 - 15    Comment: Performed at [facility], [address], [place], [place] [address]  CBC     Status: Abnormal   Collection Time: 09/04/21  4:05 AM  Result Value Ref Range   WBC 12.0 (H) 4.0 - 10.5 K/uL   RBC 4.23 4.22 - 5.81 MIL/uL   Hemoglobin 13.4 13.0 - 17.0 g/dL   HCT 36.6 44.0 - 34.7 %   MCV 95.0 80.0 - 100.0 fL   MCH 31.7 26.0 - 34.0 pg   MCHC 33.3 30.0 - 36.0 g/dL   RDW 42.5 95.6 - 38.7 %   Platelets 270 150 - 400 K/uL   nRBC 0.0 0.0 - 0.2 %    Comment: Performed at [facility], [address] [place], [place] [address]  Glucose, capillary     Status: Abnormal   Collection  Time: 09/04/21  4:11 AM  Result Value Ref Range   Glucose-Capillary 116 (H) 70 - 99 mg/dL    Comment: Glucose reference range applies only to samples taken after fasting for at least 8 hours.  Glucose, capillary     Status: Abnormal   Collection Time: 09/04/21  8:08 AM  Result Value Ref Range   Glucose-Capillary 128 (H) 70 - 99 mg/dL    Comment: Glucose reference range applies only to samples taken after fasting for at least 8 hours.  Glucose, capillary     Status: Abnormal   Collection Time: 09/04/21  1:28 PM  Result Value Ref Range   Glucose-Capillary 158 (H) 70 - 99 mg/dL    Comment: Glucose reference range applies only to samples taken after fasting for at least 8 hours.   No results found.     Medical Problem List and Plan: 1.  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  Antithrombotics: -DVT/anticoagulation:  Pharmaceutical: Lovenox added.   -antiplatelet therapy: ASA/Brilinta. 3. Pain Management: Tylenol prn for HA.  4. Mood: LCSW to follow for evaluation and support.   -antipsychotic agents: N/A 5. Neuropsych: This patient is capable of making decisions on his own behalf. 6. Skin/Wound Care: Routine pressure-relief measures. 7. Fluids/Electrolytes/Nutrition: Monitor I's/O.  Check CMet in AM 8.  HTN: Monitor BP TID.  Orthostatic symptoms reported today. --On hydralazine 25 mg 3 times daily, Cozaar 50 mg twice daily and amlodipine 10 mg daily. --We will check orthostatic vital signs. 9.  Urinary retention: Likely exacerbated by constipation. Augment bowel program and remove foley in am.  --Continue Flomax--changed to bedtime to avoid orthostatic symptoms. 10. Constipation: Will start patient on Senna S daily.  --MOM today.  11.  Leukocytosis: Monitor for fevers and other signs of infection. 12.  Intermittent hypokalemia: Recheck CMET/Mg level  in am.    --May need standing dose supplement is on hydralazine. 13.  Hyperglycemia: Likely stress related as Hemoglobin A1c 5.5. 14.  Dyslipidemia: Continue Lipitor. 15. 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  I have personally performed a face to face diagnostic evaluation of this patient and formulated the key components of the plan.  Additionally, I have personally reviewed laboratory data, imaging studies, as well as relevant notes and concur with the physician assistant's documentation above.   The patient's status has not changed from the original H&P.  Any changes in documentation from the acute care chart have been noted above.     Jacquelynn Cree, PA-C 09/04/2021

## 2021-09-05 ENCOUNTER — Encounter (HOSPITAL_COMMUNITY): Payer: Self-pay | Admitting: Physical Medicine & Rehabilitation

## 2021-09-05 ENCOUNTER — Inpatient Hospital Stay (HOSPITAL_COMMUNITY): Payer: 59

## 2021-09-05 ENCOUNTER — Other Ambulatory Visit (HOSPITAL_COMMUNITY): Payer: 59

## 2021-09-05 DIAGNOSIS — I639 Cerebral infarction, unspecified: Secondary | ICD-10-CM | POA: Diagnosis not present

## 2021-09-05 LAB — COMPREHENSIVE METABOLIC PANEL
ALT: 44 U/L (ref 0–44)
AST: 27 U/L (ref 15–41)
Albumin: 3.3 g/dL — ABNORMAL LOW (ref 3.5–5.0)
Alkaline Phosphatase: 73 U/L (ref 38–126)
Anion gap: 9 (ref 5–15)
BUN: 19 mg/dL (ref 8–23)
CO2: 25 mmol/L (ref 22–32)
Calcium: 9.1 mg/dL (ref 8.9–10.3)
Chloride: 101 mmol/L (ref 98–111)
Creatinine, Ser: 0.93 mg/dL (ref 0.61–1.24)
GFR, Estimated: >60 mL/min (ref >60)
Glucose, Bld: 95 mg/dL (ref 70–99)
Potassium: 4.7 mmol/L (ref 3.5–5.1)
Sodium: 135 mmol/L (ref 135–145)
Total Bilirubin: 0.6 mg/dL (ref 0.3–1.2)
Total Protein: 6.2 g/dL — ABNORMAL LOW (ref 6.5–8.1)

## 2021-09-05 LAB — CBC WITH DIFFERENTIAL/PLATELET
Abs Immature Granulocytes: 0.1 10*3/uL — ABNORMAL HIGH (ref 0.00–0.07)
Basophils Absolute: 0.1 10*3/uL (ref 0.0–0.1)
Basophils Relative: 0 %
Eosinophils Absolute: 0.7 10*3/uL — ABNORMAL HIGH (ref 0.0–0.5)
Eosinophils Relative: 5 %
HCT: 40 % (ref 39.0–52.0)
Hemoglobin: 13.4 g/dL (ref 13.0–17.0)
Immature Granulocytes: 1 %
Lymphocytes Relative: 13 %
Lymphs Abs: 1.8 10*3/uL (ref 0.7–4.0)
MCH: 32.1 pg (ref 26.0–34.0)
MCHC: 33.5 g/dL (ref 30.0–36.0)
MCV: 95.9 fL (ref 80.0–100.0)
Monocytes Absolute: 1.3 10*3/uL — ABNORMAL HIGH (ref 0.1–1.0)
Monocytes Relative: 10 %
Neutro Abs: 9.6 10*3/uL — ABNORMAL HIGH (ref 1.7–7.7)
Neutrophils Relative %: 71 %
Platelets: 278 10*3/uL (ref 150–400)
RBC: 4.17 MIL/uL — ABNORMAL LOW (ref 4.22–5.81)
RDW: 14 % (ref 11.5–15.5)
WBC: 13.6 10*3/uL — ABNORMAL HIGH (ref 4.0–10.5)
nRBC: 0 % (ref 0.0–0.2)

## 2021-09-05 LAB — URINALYSIS, ROUTINE W REFLEX MICROSCOPIC
Bacteria, UA: NONE SEEN
Bilirubin Urine: NEGATIVE
Glucose, UA: NEGATIVE mg/dL
Ketones, ur: NEGATIVE mg/dL
Leukocytes,Ua: NEGATIVE
Nitrite: NEGATIVE
Protein, ur: NEGATIVE mg/dL
Specific Gravity, Urine: 1.008 (ref 1.005–1.030)
pH: 7 (ref 5.0–8.0)

## 2021-09-05 LAB — MAGNESIUM: Magnesium: 2.1 mg/dL (ref 1.7–2.4)

## 2021-09-05 MED ORDER — SODIUM CHLORIDE 0.9 % IV SOLN: INTRAVENOUS | Status: DC

## 2021-09-05 MED ORDER — CITALOPRAM HYDROBROMIDE 10 MG PO TABS
10.0000 mg | ORAL_TABLET | Freq: Every day | ORAL | Status: DC
  Administered 2021-09-06 – 2021-09-27 (×22): 10 mg via ORAL
  Filled 2021-09-05 (×22): qty 1

## 2021-09-05 MED ORDER — AMLODIPINE BESYLATE 5 MG PO TABS
5.0000 mg | ORAL_TABLET | Freq: Every day | ORAL | Status: DC
  Administered 2021-09-06: 5 mg via ORAL
  Filled 2021-09-05: qty 1

## 2021-09-05 MED ORDER — SODIUM CHLORIDE 0.9 % IV SOLN: INTRAVENOUS | Status: DC | PRN

## 2021-09-05 MED ORDER — LOSARTAN POTASSIUM 25 MG PO TABS
25.0000 mg | ORAL_TABLET | Freq: Two times a day (BID) | ORAL | Status: DC
  Administered 2021-09-05 – 2021-09-07 (×5): 25 mg via ORAL
  Filled 2021-09-05 (×5): qty 1

## 2021-09-05 MED ORDER — TAMSULOSIN HCL 0.4 MG PO CAPS
0.8000 mg | ORAL_CAPSULE | Freq: Every day | ORAL | Status: DC
  Administered 2021-09-06 – 2021-09-10 (×5): 0.8 mg via ORAL
  Filled 2021-09-05 (×5): qty 2

## 2021-09-05 MED ORDER — SORBITOL 70 % SOLN
60.0000 mL | Freq: Once | Status: AC
  Administered 2021-09-08: 60 mL via ORAL
  Filled 2021-09-05: qty 60

## 2021-09-05 NOTE — Evaluation (Signed)
Physical Therapy Assessment and Plan  Patient Details  Name: Maurice Little MRN: [id number] Date of Birth: [birth year]  PT Diagnosis: Abnormality of gait, Ataxia, Ataxic gait, Coordination disorder, Difficulty walking, Impaired sensation, and Muscle weakness Rehab Potential: Good ELOS: 2.5-3 weeks   Today's Date: 09/05/2021 PT Individual Time: 8887-5797 PT Individual Time Calculation (min): 57 min    Hospital Problem: Principal Problem:   Cerebellar stroke [facility])   Past Medical History:  Past Medical History:  Diagnosis Date   Hypertension    PAD (peripheral artery disease) ([place])    Past Surgical History:  Past Surgical History:  Procedure Laterality Date   BUBBLE STUDY  08/31/2021   Procedure: BUBBLE STUDY;  Surgeon: Skeet Latch, MD;  Location: [place];  Service: Cardiovascular;;   IR ANGIO VERTEBRAL SEL SUBCLAVIAN INNOMINATE UNI L MOD SED  08/30/2021   IR ANGIO VERTEBRAL SEL SUBCLAVIAN INNOMINATE UNI R MOD SED  08/30/2021   IR CT HEAD LTD  08/30/2021   IR INTRA CRAN STENT  08/30/2021   IR US GUIDE VASC ACCESS RIGHT  08/30/2021   RADIOLOGY WITH ANESTHESIA N/A 08/30/2021   Procedure: IR WITH ANESTHESIA;  Surgeon: Luanne Bras, MD;  Location: [place];  Service: Radiology;  Laterality: N/A;   TEE WITHOUT CARDIOVERSION N/A 08/31/2021   Procedure: TRANSESOPHAGEAL ECHOCARDIOGRAM (TEE);  Surgeon: Skeet Latch, MD;  Location: [facility] ENDOSCOPY;  Service: Cardiovascular;  Laterality: N/A;    Assessment & Plan Clinical Impression: Patient is a 65 y.o. RH-male with history of HTN, PAD who was admitted on 08/29/21 with onset of dizziness and headaches approximately day prior to admission followed by left-sided tingling for 2 hours the next day.  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  CTA head/neck showed atherosclerosis with question of high-grade stenosis or nonocclusive clot in basilar artery.  He was loaded with DAPT and started on IV heparin.  He developed right facial  droop with right hearing loss, diplopia with nystagmus as well as slurred speech with nausea evening past admission.  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 Estanislado Pandy.  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  Postprocedure to be on aspirin and Brilinta for at least a year.   BLE Dopplers were negative for DVT.  Cardiology was consulted for input and he underwent TEE revealing LVEF 60-65%, no LAA thrombus, PFO + R-->L shunt and atherosclerosis of the descending aorta.  MRI brain done revealing acute infarct in right cerebellar hemisphere and small additional infarcts in right brachium pontis and right pontomedullary junction.  Dr. Leonie Man felt that stroke was most likely secondary to atherosclerosis with R-severe occlusion and PA high-grade stenosis.  Recommendations are for 30-day cardiac event monitor on outpatient basis to rule out A. fib.  Dr. Estanislado Pandy plans right ICA stenting in approximately 6 weeks.  MBS done showing mild oral dysphagia and dysphagia 3 with thin liquids recommended.  He has also had issues with urinary retention requiring placement of Foley on 11/06.  Patient continues to be limited by balance deficits with RLE incoordination, left-sided numbness, diplopia as well as orthostatic changes today with BP 76/56 after standing.  Therapy ongoing and CIR recommended due to functional decline. Patient transferred to CIR on 09/04/2021 .   Patient currently requires max assist with mobility secondary to muscle weakness, decreased cardiorespiratoy endurance, impaired timing and sequencing, unbalanced muscle activation, ataxia, and decreased coordination, decreased visual acuity  and decreased visual motor skills,  , central origin, and decreased sitting balance, decreased standing balance, decreased postural control, and decreased balance strategies.  Prior to  hospitalization, patient was independent  with mobility and lived with Spouse in a House home.  Home access is 3Stairs to enter.  Patient will benefit from skilled PT intervention to maximize safe functional mobility, minimize fall risk, and decrease caregiver burden for planned discharge home with 24 hour assist.  Anticipate patient will benefit from follow up OP at discharge.  PT - End of Session Activity Tolerance: Tolerates 30+ min activity with multiple rests Endurance Deficit: Yes Endurance Deficit Description: Pt reports fatigue during session and requires seated rest breaks due to labored breathing PT Assessment Rehab Potential (ACUTE/IP ONLY): Good PT Barriers to Discharge: Inaccessible home environment PT Patient demonstrates impairments in the following area(s): Balance;Safety;Behavior;Sensory;Edema;Skin Integrity;Endurance;Motor;Nutrition;Pain;Perception PT Transfers Functional Problem(s): Bed Mobility;Bed to Chair;Car;Furniture;Floor PT Locomotion Functional Problem(s): Ambulation;Stairs;Wheelchair Mobility PT Plan PT Intensity: Minimum of 1-2 x/day ,45 to 90 minutes PT Frequency: 5 out of 7 days PT Duration Estimated Length of Stay: 2.5-3 weeks PT Treatment/Interventions: Ambulation/gait training;Community reintegration;DME/adaptive equipment instruction;Neuromuscular re-education;Psychosocial support;Stair training;UE/LE Strength taining/ROM;Wheelchair propulsion/positioning;Balance/vestibular training;Discharge planning;Functional electrical stimulation;Pain management;Skin care/wound management;Therapeutic Activities;UE/LE Coordination activities;Cognitive remediation/compensation;Disease management/prevention;Functional mobility training;Patient/family education;Splinting/orthotics;Therapeutic Exercise;Visual/perceptual remediation/compensation PT Transfers Anticipated Outcome(s): CGA using LRAD PT Locomotion Anticipated Outcome(s): CGA using LRAD PT Recommendation Follow Up  Recommendations: Outpatient PT;24 hour supervision/assistance Patient destination: Home Equipment Recommended: To be determined    PT Evaluation Precautions/Restrictions Precautions Precautions: Other (comment);Fall Precaution Comments: diplopia, Ataxic R; L side numbness Restrictions Weight Bearing Restrictions: No Pain Pain Assessment Pain Scale: 0-10 Pain Score: 0-No pain Pain Interference Pain Interference Pain Effect on Sleep: 2. Occasionally Pain Interference with Therapy Activities: 1. Rarely or not at all Pain Interference with Day-to-Day Activities: 1. 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 Perception Perception: Impaired Spatial Orientation: will further assess spatial orientation Praxis Praxis: Intact  Cognition  Overall Cognitive Status: Impaired/Different from baseline Arousal/Alertness: Awake/alert Orientation Level: Oriented X4 Year: 2022 Month: November Day of Week:  Correct Attention: Focused;Sustained;Selective Focused Attention: Appears intact Sustained Attention: Appears intact Selective Attention: Appears intact Memory: Appears intact Awareness: Appears intact Safety/Judgment: Appears intact Sensation  Sensation Light Touch: Impaired Detail Central sensation comments: able to feel light touch but it is with abnormal sensation of "numbness and tingling" Light Touch Impaired Details: Impaired LLE Hot/Cold: Not tested Proprioception: Impaired Detail (noticed functionally but will continue to assess) Proprioception Impaired Details: Impaired RLE Coordination Gross Motor Movements are Fluid and Coordinated: No Coordination and Movement Description: gross motor movements impaired due to truncal and R hemibody ataxia Heel Shin Test: R LE ataxia, L LE [facility] Motor  Motor Motor: Ataxia;Abnormal postural alignment and control Motor - Skilled Clinical Observations: R hemibody and truncal ataxia   Trunk/Postural Assessment  Cervical Assessment Cervical Assessment: Within Functional Limits Thoracic Assessment Thoracic Assessment: Within Functional Limits Lumbar Assessment Lumbar Assessment: Within Functional Limits Postural Control Postural Control: Deficits on evaluation Trunk Control: impaired with minor postural sway noted in sitting due to truncal ataxia Postural Limitations: significantly impaired due to ataxia and increased postural sway in standing  Balance Balance Balance Assessed: Yes Static Sitting Balance Static Sitting - Balance Support: Feet supported Static Sitting - Level of Assistance: 5: Stand by assistance;Other (comment) (CGA) Dynamic Sitting Balance Dynamic Sitting - Balance Support: During functional activity Dynamic Sitting - Level of Assistance: 4: Min assist Static Standing Balance Static Standing - Balance Support: During functional activity Static Standing - Level of Assistance: 3: Mod assist Dynamic Standing  Balance Dynamic Standing - Balance Support: During functional activity Dynamic Standing - Level of Assistance: 2: Max assist;Other (comment) (+2 assist) Extremity Assessment      RLE Assessment RLE Assessment: Exceptions to [facility] Active Range of Motion (AROM) Comments: WFL General Strength Comments: asessed in supine RLE Strength Right Hip Flexion: 4+/5 Right Knee Flexion: 4+/5 Right Knee Extension: 4+/5 Right Ankle Dorsiflexion: 4+/5 Right Ankle Plantar Flexion: 4+/5 LLE Assessment LLE Assessment: Exceptions to [facility] Active Range of Motion (AROM) Comments: WFL General Strength Comments: assessed in supine LLE Strength Left Hip Flexion: 4+/5 Left Knee Flexion: 4+/5 Left Knee Extension: 4+/5 Left Ankle Dorsiflexion: 4+/5 Left Ankle Plantar Flexion: 4+/5  Care Tool Care Tool Bed Mobility Roll left and right activity   Roll left and right assist level: Supervision/Verbal cueing    Sit to lying activity   Sit to lying assist level: Contact Guard/Touching assist    Lying to sitting on side of bed activity   Lying to sitting on side of bed assist level: the ability to move from lying on the back to sitting on the side of the bed with no back support.: Contact Guard/Touching assist     Care Tool Transfers Sit to stand transfer   Sit to stand assist level: Moderate Assistance - Patient 50 - 74%    Chair/bed transfer   Chair/bed transfer assist level: Maximal Assistance - Patient 25 - 49%     Psychologist, counselling transfer activity did not occur: Safety/medical concerns        Care Tool Locomotion Ambulation Ambulation activity did not occur: Safety/medical concerns (requires +2 and skilled assist with use of hallway rail)        Walk 10 feet activity Walk 10 feet activity did not occur: Safety/medical concerns       Walk 50 feet with 2 turns activity Walk 50 feet with 2 turns activity did not occur: Safety/medical concerns      Walk 150 feet  activity Walk 150 feet activity did not occur: Safety/medical concerns      Walk 10 feet on uneven surfaces activity Walk 10 feet on uneven surfaces activity did not occur: Safety/medical concerns      Stairs Stair activity did not occur: Safety/medical concerns        Walk up/down 1 step activity Walk up/down 1 step or curb (drop down) activity did not occur: Safety/medical concerns      Walk up/down 4 steps activity Walk up/down 4 steps activity did not occur: Safety/medical concerns      Walk up/down 12 steps activity Walk up/down  12 steps activity did not occur: Safety/medical concerns      Pick up small objects from floor Pick up small object from the floor (from standing position) activity did not occur: Safety/medical concerns      Wheelchair Is the patient using a wheelchair?: Yes (currently using TIS due to BP)   Wheelchair activity did not occur: N/A      Wheel 50 feet with 2 turns activity      Wheel 150 feet activity        Refer to Care Plan for Long Term Goals  SHORT TERM GOAL WEEK 1 PT Short Term Goal 1 (Week 1): Pt will perform supine<>sit with supervision PT Short Term Goal 2 (Week 1): Pt will perform sit<>stands with mod assist of 1 consistently PT Short Term Goal 3 (Week 1): Pt will perform bed<>chair transfers with mod assist of 1 consistently PT Short Term Goal 4 (Week 1): Pt will ambulate at least 49f using LRAD with +2 mod assist  Recommendations for other services: None   Skilled Therapeutic Intervention Pt received supine in bed with his wife present and pt agreeable to therapy session. Evaluation completed (see details above) with patient education regarding purpose of PT evaluation, PT POC and goals, therapy schedule, weekly team meetings, and other CIR information including safety plan and fall risk safety. Vitals assessed as described below - thigh high TED hose donned at beginning of session. Therapist provided pt with TIS w/c and w/c cushion  for pressure relief while allowing BP management due to hx of orthostatic hypotension on acute care. Pt completed the below mobility tasks with the specified levels of assistance. Pt demonstrates truncal ataxia in sitting with postural sway as well as in standing with increased R LE ataxia noted during standing functional mobility tasks.  Supine vitals without TED hose: BP 100/81 (MAP 89), HR 97bpm  Supine vitals with thigh high TED hose: BP 141/89 (MAP 103), HR 96bpm  Sitting: BP 125/88 (MAP 99), HR 104bpm  Standing: BP 110/80 (MAP 88), HR 118bpm  After transfer to w/c: BP 123/85 (MAP 97), HR 96bpm  After gait: BP 136/96 (MAP 109), HR 105bpm   At end of session pt agreeable to remain sitting in TIS w/c with vitals stable - nursing staff aware and pt's wife present - left with needs in reach and seat belt alarm on.   Mobility Bed Mobility Bed Mobility: Sit to Supine;Supine to Sit Supine to Sit: Contact Guard/Touching assist Sit to Supine: Contact Guard/Touching assist Transfers Transfers: Sit to Stand;Stand to Sit;Squat Pivot Transfers;Stand Pivot Transfers Sit to Stand: Moderate Assistance - Patient 50-74%;Maximal Assistance - Patient 25-49% Stand to Sit: Moderate Assistance - Patient 50-74%;Maximal Assistance - Patient 25-49% Stand Pivot Transfers: Maximal Assistance - Patient 25 - 49% Stand Pivot Transfer Details: Manual facilitation for weight shifting;Tactile cues for weight shifting;Tactile cues for sequencing;Tactile cues for placement;Tactile cues for posture;Verbal cues for sequencing;Verbal cues for technique;Verbal cues for precautions/safety Squat Pivot Transfers: Moderate Assistance - Patient 50-74% Transfer (Assistive device): None Locomotion  Gait Ambulation: Yes Gait Assistance: 2 Helpers (mod A of 1 and +2 w/c follow) Gait Distance (Feet): 30 Feet Assistive device: Other (Comment) (L hallway rail) Gait Assistance Details: Manual facilitation for weight shifting;Tactile  cues for weight shifting;Tactile cues for placement;Tactile cues for posture;Verbal cues for sequencing;Verbal cues for gait pattern;Verbal cues for technique;Verbal cues for precautions/safety;Tactile cues for sequencing;Tactile cues for initiation Gait Gait: Yes Gait Pattern: Impaired Gait Pattern: Step-through pattern;Poor foot clearance - right;Decreased stance  time - right (R LE ataxia with incoordinated stepping resulting in variable step length and step width) Gait velocity: significantly decreased Stairs / Additional Locomotion Stairs: No Wheelchair Mobility Wheelchair Mobility: No (pt currently utilizing TIS wheelchair due to risk of orthostatic hypotension)   Discharge Criteria: Patient will be discharged from PT if patient refuses treatment 3 consecutive times without medical reason, if treatment goals not met, if there is a change in medical status, if patient makes no progress towards goals or if patient is discharged from hospital.  The above assessment, treatment plan, treatment alternatives and goals were discussed and mutually agreed upon: by patient and by family  Tawana Scale , PT, DPT, NCS, CSRS 09/05/2021, 7:53 AM

## 2021-09-05 NOTE — Progress Notes (Signed)
Physical Therapy Session Note  Patient Details  Name: Maurice Little MRN: [id number] Date of Birth: 02/23/1956  Today's Date: 09/05/2021 PT Individual Time: 1300-1340 PT Individual Time Calculation (min): 40 min   Short Term Goals: Week 1:     Skilled Therapeutic Interventions/Progress Updates:     Patient received sitting up in wc, agreeable to PT. He denies pain, but perseverates on decreasing vision in R eye and decreased hearing in R ear. PT transporting patient in wc to therapy gym for time management and energy conservation. BP assessed in sitting: 117/76. Patient stating "oh that's too low for me to stand." PT educating patient on safe BP parameters for mobility and assessment of s/s of hypotension. Patient standing with RW and MinA. BP 118/78, no s/s of hypotension. Patient completed the PASS- see below. Patient transferring into/out of car with MinA x2 and explicit verbal cuing for sequencing. Patient returning to room in wc, transferring to bed via stand pivot with ModA per RN request for bladder scan. Bed alarm on, call light within reach.  Postural Assessment Scale for Stroke Patients (PASS)  Give the subject instructions for each item as written below. When scoring the item, record the lowest response category that applies for each item.  Maintaining a Posture  __ 1. Sitting Without Support Instructions: Have the subject sit on a bench/mat without back support and with feet flat on the floor. (3) Can sit for 5 minutes without support (2) Can sit for more than 10 seconds without support (1) Can sit with slight support (for example, by 1 hand) (0) Cannot sit  __ 2. Standing With Support Instructions: Have the subject stand, providing support as needed. Evaluate only the ability to stand with or without support. Do not consider the quality of the stance. (3) Can stand with support of only 1 hand (2) Can stand with moderate support of 1 person (1) Can stand with strong support of  2 people (0) Cannot stand, even with support  __ 3. Standing Without Support Instructions: Have the subject stand without support. Evaluate only the ability to stand with or without support. Do not consider the quality of the stance. (3) Can stand without support for more than 1 minute and simultaneously perform arm movements at about shoulder level (2) Can stand without support for 1 minute or stands slightly asymmetrically  (1) Can stand without support for 10 seconds or leans heavily on 1 leg (0) Cannot stand without support  __ 4. Standing on Nonparetic Leg Instructions: Have the subject stand on the nonparetic leg. Evaluate only the ability to bear weight entirely on the nonparetic leg. Do not consider how the subject accomplishes the task. (3) Can stand on nonparetic leg for more than 10 seconds (2) Can stand on nonparetic leg for more than 5 seconds (1) Can stand on nonparetic leg for a few seconds (0) Cannot stand on nonparetic leg  __ 5. Standing on Paretic Leg Instructions: Have the subject stand on the paretic leg. Evaluate only the ability to bear weight entirely on the paretic leg. Do not consider how the subject accomplishes the task. (3) Can stand on paretic leg for more than 10 seconds (2) Can stand on paretic leg for more than 5 seconds (1) Can stand on paretic leg for a few seconds (0) Cannot stand on paretic leg  Maintaining Posture SUBTOTAL ____5____  Changing a Posture  __ 6. Supine to Paretic Side Lateral Instructions: Begin with the subject in supine on a treatment mat.  Instruct the subject to roll to the paretic side (lateral movement). Assist as necessary. Evaluate the subject's performance on the amount of help required. Do not consider the quality of performance. (3) Can perform without help (2) Can perform with little help (1) Can perform with much help (0) Cannot perform  __ 7. Supine to Nonparetic Side Lateral Instructions: Begin with the subject in  supine on a treatment mat. Instruct the subject to roll to the nonparetic side (lateral movement). Assist as necessary. Evaluate the subject's performance on the amount of help required. Do not consider the quality of performance. (3) Can perform without help (2) Can perform with little help (1) Can perform with much help (0) Cannot perform  __ 8. Supine to Sitting Up on the Edge of the Mat Instructions: Begin with the subject in supine on a treatment mat. Instruct the subject to come to sitting on the edge of the mat. Assist as necessary. Evaluate the subject's performance on the amount of help required. Do not consider the quality of performance. (3) Can perform without help (2) Can perform with little help (1) Can perform with much help (0) Cannot perform  __ 9. Sitting on the Edge of the Mat to Supine Instructions: Begin with the on the edge of a treatment mat. Instruct the subject to return to supine. Assist as necessary. Evaluate the subject's performance on the amount of help required. Do not consider the quality of performance. (3) Can perform without help (2) Can perform with little help (1) Can perform with much help (0) Cannot perform  __ 10. Sitting to Standing Up Instructions: Begin with the subject sitting on the edge of a treatment mat. Instruct the subject to stand up without support. Assist if necessary. Evaluate the subject's performance on the amount of help required. Do not consider the quality of performance. (3) Can perform without help (2) Can perform with little help (1) Can perform with much help (0) Cannot perform  __ 11. Standing Up to Sitting Down Instructions: Begin with the subject standing by edge of a treatment mat. Instruct the subject to sit on edge of mat without support. Assist if necessary. Evaluate the subject's performance on the amount of help required. Do not consider the quality of performance. (3) Can perform without help (2) Can perform with  little help (1) Can perform with much help (0) Cannot perform  __ 12. Standing, Picking Up a Pencil from the Floor Instructions: Begin with the subject standing. Instruct the subject to pick up a pencil fro the floor without support. Assist if necessary. Evaluate the subject's performance on the amount of help required. Do not consider the quality of performance. (3) Can perform without help (2) Can perform with little help (1) Can perform with much help (0) Cannot perform  Changing Posture SUBTOTAL _12____   TOTAL _17____   Therapy Documentation Precautions:  Restrictions Weight Bearing Restrictions: No   Therapy/Group: Individual Therapy  Elizebeth Koller, PT, DPT, CBIS  09/05/2021, 7:35 AM

## 2021-09-05 NOTE — Progress Notes (Signed)
[facility]patient Rehabilitation Center Individual Statement of Services  Patient Name:  Maurice Little  Date:  09/05/2021  Welcome to the Inpatient Rehabilitation Center.  Our goal is to provide you with an individualized program based on your diagnosis and situation, designed to meet your specific needs.  With this comprehensive rehabilitation program, you will be expected to participate in at least 3 hours of rehabilitation therapies Monday-Friday, with modified therapy programming on the weekends.  Your rehabilitation program will include the following services:  Physical Therapy (PT), Occupational Therapy (OT), Speech Therapy (ST), 24 hour per day rehabilitation nursing, Therapeutic Recreaction (TR), Neuropsychology, Care Coordinator, Rehabilitation Medicine, Nutrition Services, Pharmacy Services, and Other  Weekly team conferences will be held on Wednesdays to discuss your progress.  Your Inpatient Rehabilitation Care Coordinator will talk with you frequently to get your input and to update you on team discussions.  Team conferences with you and your family in attendance may also be held.  Expected length of stay:  16-18 Days  Overall anticipated outcome:  MIN A : PT and OT MOD I:SLP  Depending on your progress and recovery, your program may change. Your Inpatient Rehabilitation Care Coordinator will coordinate services and will keep you informed of any changes. Your Inpatient Rehabilitation Care Coordinator's name and contact numbers are listed  below.  The following services may also be recommended but are not provided by the Inpatient Rehabilitation Center:   Home Health Rehabiltiation Services Outpatient Rehabilitation Services    Arrangements will be made to provide these services after discharge if needed.  Arrangements include referral to agencies that provide these services.  Your insurance has been verified to be:  [facility] Your primary doctor is:  Tora Duck, MD  Pertinent information will  be shared with your doctor and your insurance company.  Inpatient Rehabilitation Care Coordinator:  Lavera Guise, [place] [phone] or [phone]  Information discussed with and copy given to patient by: Andria Rhein, 09/05/2021, 11:24 AM

## 2021-09-05 NOTE — Evaluation (Signed)
Occupational Therapy Assessment and Plan  Patient Details  Name: Maurice Little MRN: [id number] Date of Birth: [birth year]  OT Diagnosis: abnormal posture, cognitive deficits, disturbance of vision, hemiplegia affecting dominant side, and muscle weakness (generalized) Rehab Potential: Rehab Potential (ACUTE ONLY): Excellent ELOS: 18-21 days   Today's Date: 09/05/2021 OT Individual Time: 9937-1696 OT Individual Time Calculation (min): 63 min     Hospital Problem: Principal Problem:   Cerebellar stroke [facility])   Past Medical History:  Past Medical History:  Diagnosis Date   Hypertension    PAD (peripheral artery disease) ([place])    Past Surgical History:  Past Surgical History:  Procedure Laterality Date   BUBBLE STUDY  08/31/2021   Procedure: BUBBLE STUDY;  Surgeon: Skeet Latch, MD;  Location: [place];  Service: Cardiovascular;;   IR ANGIO VERTEBRAL SEL SUBCLAVIAN INNOMINATE UNI L MOD SED  08/30/2021   IR ANGIO VERTEBRAL SEL SUBCLAVIAN INNOMINATE UNI R MOD SED  08/30/2021   IR CT HEAD LTD  08/30/2021   IR INTRA CRAN STENT  08/30/2021   IR US GUIDE VASC ACCESS RIGHT  08/30/2021   RADIOLOGY WITH ANESTHESIA N/A 08/30/2021   Procedure: IR WITH ANESTHESIA;  Surgeon: Luanne Bras, MD;  Location: [place];  Service: Radiology;  Laterality: N/A;   TEE WITHOUT CARDIOVERSION N/A 08/31/2021   Procedure: TRANSESOPHAGEAL ECHOCARDIOGRAM (TEE);  Surgeon: Skeet Latch, MD;  Location: [facility] ENDOSCOPY;  Service: Cardiovascular;  Laterality: N/A;    Assessment & Plan Clinical Impression: Patient is a 65 y.o. year old male with recent admission to the hospital on 08/29/21 with onset of dizziness and headaches approximately day prior to admission followed by left-sided tingling for 2 hours the next day.  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  CTA head/neck showed atherosclerosis with question of high-grade stenosis or nonocclusive clot in basilar artery.  He was loaded with DAPT and  started on IV heparin.  He developed right facial droop with right hearing loss, diplopia with nystagmus as well as slurred speech with nausea evening past admission.  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 Estanislado Pandy.  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  Marland Kitchen  Patient transferred to CIR on 09/04/2021 .    Patient currently requires max with basic self-care skills secondary to muscle weakness and muscle paralysis, decreased cardiorespiratoy endurance, impaired timing and sequencing, ataxia, and decreased coordination, decreased visual motor skills, delayed processing, central origin, and decreased sitting balance, decreased standing balance, decreased postural control, hemiplegia, and decreased balance strategies.  Prior to hospitalization, patient could complete ADLs with independent .  Patient will benefit from skilled intervention to decrease level of assist with basic self-care skills and increase independence with basic self-care skills prior to discharge home with care partner.  Anticipate patient will require 24 hour supervision and follow up home health.  OT - End of Session Activity Tolerance: Decreased this session Endurance Deficit: Yes Endurance Deficit Description: Pt reports increased fatigue during completion of selfcare tasks OT Assessment Rehab Potential (ACUTE ONLY): Excellent OT Patient demonstrates impairments in the following area(s): Balance;Cognition;Endurance;Motor;Vision;Sensory;Safety;Perception OT Basic ADL's Functional Problem(s): Eating;Grooming;Bathing;Dressing;Toileting OT Transfers Functional Problem(s): Toilet;Tub/Shower OT Additional Impairment(s): Fuctional Use of Upper Extremity OT Plan OT Intensity: Minimum of 1-2 x/day, 45 to 90 minutes OT Frequency: 5 out of 7 days OT Duration/Estimated Length of Stay: 18-21  days OT Treatment/Interventions: Balance/vestibular training;Discharge planning;Functional electrical stimulation;Self Care/advanced ADL  retraining;Therapeutic Activities;UE/LE Coordination activities;Cognitive remediation/compensation;Functional mobility training;Patient/family education;Therapeutic Exercise;DME/adaptive equipment instruction;Neuromuscular re-education;UE/LE Strength taining/ROM;Psychosocial support OT Self Feeding Anticipated Outcome(s): setup OT Basic Self-Care Anticipated Outcome(s): setup OT Toileting Anticipated Outcome(s): min guard assist OT Bathroom Transfers Anticipated Outcome(s): min guard assist OT Recommendation Recommendations for Other Services: Therapeutic Recreation consult Therapeutic Recreation Interventions: Outing/community reintergration Patient destination: Home Follow Up Recommendations: None Equipment Recommended: 3 in 1 bedside comode   OT Evaluation Precautions/Restrictions  Precautions Precautions: Fall Precaution Comments: diplopia, Ataxic R; L side numbness Restrictions Weight Bearing Restrictions: No   Pain  No report of pain  Home Living/Prior Functioning Home Living Family/patient expects to be discharged to:: Private residence Living Arrangements: Spouse/significant other Available Help at Discharge: Family, Available 24 hours/day Type of Home: House Home Access: Stairs to enter Technical brewer of Steps: 3 Entrance Stairs-Rails: Right, Left Home Layout: One level Bathroom Shower/Tub: Multimedia programmer: Standard Bathroom Accessibility: Yes  Lives With: Spouse IADL History Homemaking Responsibilities: Yes Meal Prep Responsibility: Secondary Laundry Responsibility: Secondary Cleaning Responsibility: Secondary Bill Paying/Finance Responsibility: Secondary Current License: Yes Occupation: Full time employment 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 Perception  Perception: Impaired Praxis Praxis: Intact Cognition Overall Cognitive Status: Impaired/Different from baseline Arousal/Alertness: Awake/alert Orientation Level: Person;Place;Situation Person: Oriented Place: Oriented Situation: Oriented Year: 2022 Month: November Day of Week: Correct Memory: Appears intact Immediate Memory Recall: Sock;Blue;Bed Memory Recall Sock: Without Cue Memory Recall Blue: Without Cue Memory Recall Bed: Without Cue Attention: Focused;Sustained Focused Attention: Appears intact Sustained Attention: Appears intact Selective Attention: Appears intact Awareness: Appears intact Behaviors: Other (comment) (echolalia) Safety/Judgment: Appears intact Sensation Sensation Light Touch: Impaired Detail Central sensation comments: able to feel light touch but it is with abnormal sensation of "numbness and tingling" Light Touch Impaired Details: Impaired LUE Hot/Cold: Not tested Proprioception: Not tested Coordination Gross Motor Movements are Fluid and Coordinated: No Coordination and Movement Description: Moderate RUE ataxia noted with functional use. Motor  Motor Motor: Ataxia;Abnormal postural alignment and control Motor - Skilled Clinical Observations: R hemibody and truncal ataxia  Trunk/Postural Assessment  Cervical Assessment Cervical Assessment: Within Functional Limits Thoracic Assessment Thoracic  Assessment: Within Functional Limits Lumbar Assessment Lumbar Assessment: Within Functional Limits Postural Control Postural Control: Deficits on evaluation Trunk Control: impaired with minor postural sway noted in sitting due to truncal ataxia Postural Limitations: significantly impaired due to ataxia and increased postural sway in standing  Balance Balance Balance Assessed: Yes Standardized Balance Assessment Standardized Balance Assessment: PASS Static Sitting Balance Static Sitting - Balance Support: Feet supported Static Sitting - Level of Assistance: Other (comment) (contact guard assist) Dynamic Sitting Balance Dynamic Sitting - Balance Support: During functional activity Dynamic Sitting - Level of Assistance: 4: Min assist (posterior LOB) Static Standing Balance Static Standing - Balance Support: During functional activity Static Standing - Level of Assistance: 3: Mod assist Dynamic Standing Balance Dynamic Standing - Balance Support: During functional activity Dynamic Standing - Level of Assistance: 1: +1 Total assist Extremity/Trunk Assessment RUE Assessment RUE Assessment: Exceptions to [facility] Passive  Range of Motion (PROM) Comments: WFLS Active Range of Motion (AROM) Comments: WFLS General Strength Comments: strength 5/5 throughout with severe ataxia noted proximally with functional use and finger to nose LUE Assessment LUE Assessment: Exceptions to [facility] Passive Range of Motion (PROM) Comments: PROM WFLS for all joints Active Range of Motion (AROM) Comments: WFLs for all joints General Strength Comments: 4/5 throughout  Care Tool Care Tool Self Care Eating   Eating Assist Level: Maximal Assistance - Patient 25 - 49%    Oral Care    Oral Care Assist Level: Maximal assistance - Patient 25 - 49%    Bathing   Body parts bathed by patient: Right arm;Left arm;Chest;Abdomen;Right upper leg;Left upper leg;Right lower leg;Left lower leg;Face Body parts bathed by helper:  Front perineal area;Buttocks   Assist Level: Maximal Assistance - Patient 24 - 49%    Upper Body Dressing(including orthotics)   What is the patient wearing?: Pull over shirt   Assist Level: Minimal Assistance - Patient > 75%    Lower Body Dressing (excluding footwear)   What is the patient wearing?: Pants;Incontinence brief Assist for lower body dressing: Maximal Assistance - Patient 25 - 49%    Putting on/Taking off footwear   What is the patient wearing?: Non-skid slipper socks Assist for footwear: Maximal Assistance - Patient 25 - 49%       Care Tool Toileting Toileting activity   Assist for toileting: Maximal Assistance - Patient 25 - 49%     Care Tool Bed Mobility Roll left and right activity        Sit to lying activity   Sit to lying assist level: Minimal Assistance - Patient > 75%    Lying to sitting on side of bed activity   Lying to sitting on side of bed assist level: the ability to move from lying on the back to sitting on the side of the bed with no back support.: Minimal Assistance - Patient > 75%     Care Tool Transfers Sit to stand transfer   Sit to stand assist level: Moderate Assistance - Patient 50 - 74%    Chair/bed transfer   Chair/bed transfer assist level: Maximal Assistance - Patient 25 - 49%     Toilet transfer   Assist Level: Maximal Assistance - Patient 24 - 49%     Care Tool Cognition  Expression of Ideas and Wants Expression of Ideas and Wants: 3. Some difficulty - exhibits some difficulty with expressing needs and ideas (e.g, some words or finishing thoughts) or speech is not clear  Understanding Verbal and Non-Verbal Content Understanding Verbal and Non-Verbal Content: 3. Usually understands - understands most conversations, but misses some part/intent of message. Requires cues at times to understand   Memory/Recall Ability Memory/Recall Ability : Current season;That he or she is in a hospital/hospital unit   Refer to Care Plan for  [place] 1 OT Short Term Goal 1 (Week 1): Pt will maintain dynamic sitting balance at min guard assist during selfcare tasks sitting unsupported. OT Short Term Goal 2 (Week 1): Pt will complete LB bathing at min assist level sit to stand for two consecutive sessions,. OT Short Term Goal 3 (Week 1): Pt will complete LB dressing sit to stand with mod assist for two consecutive sessions. OT Short Term Goal 4 (Week 1): Pt will complete toilet transfers with min assist sit to stand.  Recommendations for other services: Therapeutic Recreation  Stress management   Skilled  Therapeutic Intervention ADL ADL Eating: Maximal assistance Where Assessed-Eating: Bed level Grooming: Maximal assistance Where Assessed-Grooming: Bed level Upper Body Bathing: Minimal assistance Where Assessed-Upper Body Bathing: Edge of bed Lower Body Bathing: Maximal assistance Where Assessed-Lower Body Bathing: Edge of bed Upper Body Dressing: Minimal assistance Where Assessed-Upper Body Dressing: Edge of bed Lower Body Dressing: Maximal assistance Where Assessed-Lower Body Dressing: Edge of bed Toileting: Dependent Where Assessed-Toileting: Bedside Commode Toilet Transfer: Maximal assistance Toilet Transfer Method: Stand pivot Science writer: Radiographer, therapeutic: Not assessed Social research officer, government: Not assessed Mobility  Bed Mobility Bed Mobility: Sit to Supine;Supine to Sit Supine to Sit: Contact Guard/Touching assist Sit to Supine: Contact Guard/Touching assist Transfers Sit to Stand: Moderate Assistance - Patient 50-74%;Maximal Assistance - Patient 25-49% Stand to Sit: Moderate Assistance - Patient 50-74%;Maximal Assistance - Patient 25-49%  Session Note:  Pt in bed with spouse present to start.  BP taken at 143/90.  He was able to transfer to the EOB with min guard assist exhibiting moderate trunkial ataxia once sitting statically but no significant  LOB noted.  Mod instructional cueing was needed for sequencing bathing task throughout with pt using his dominant RUE for picking up the washcloth and placing it back in the water.  Moderate ataxia noted with this as well as functional use during bathing.  Increased diplopia reported when his glasses with the taped lens were removed and he was attempting to pick up the bottle of soap.  He was able to stand with mod assist for therapist to help with washing buttocks.  Mod instructional cueing also needed for hand placement as he wanted to grab the bedside table to pull up on instead of pushing off of the bed.  Max assist for threading brief and pants with max assist for standing to pull them up over his hips.  He also complete max assist stand pivot transfer to the 3:1 after stating he had to have a BM.  Increased ataxia and forward LOB noted.  Total assist for completion of toilet hygiene and clothing management in two trials secondary to getting light headed when he stood up.  Unable to obtain BP in standing, but did check in sitting at 121/79.  Finished session with pt in the bed and with the call button and phone in reach.  His spouse was also present as well.  Both spouse and pt agreeable to expected min-supervision level goals and ELOS of 2.5-3 weeks.   Discharge Criteria: Patient will be discharged from OT if patient refuses treatment 3 consecutive times without medical reason, if treatment goals not met, if there is a change in medical status, if patient makes no progress towards goals or if patient is discharged from hospital.  The above assessment, treatment plan, treatment alternatives and goals were discussed and mutually agreed upon: by patient  Jenika Chiem OTR/L 09/05/2021, 3:40 PM

## 2021-09-05 NOTE — Progress Notes (Signed)
Patient had issues with hypotension yesterday BP 76/57 with therapy as well as dizziness with activity his am. He reported some new visual changes as well as hearing freight train noises. CT head ordered for follow up and showed new hypodensities in medulla not seen on prior films.  Question hypoperfusion--reached out to Dr. Pearlean Brownie for input. Will place patient on bedrest and start IVF

## 2021-09-05 NOTE — Evaluation (Signed)
Speech Language Pathology Assessment and Plan  Patient Details  Name: Maurice Little MRN: [id number] Date of Birth: 1956/05/23  SLP Diagnosis: Dysarthria;Dysphagia  Rehab Potential: Excellent ELOS: 2.5-3   Today's Date: 09/05/2021 SLP Individual Time: 1100-1200 SLP Individual Time Calculation (min): 60 min  Hospital Problem: Principal Problem:   Cerebellar stroke [facility])  Past Medical History:  Past Medical History:  Diagnosis Date   Hypertension    PAD (peripheral artery disease) ([place])    Past Surgical History:  Past Surgical History:  Procedure Laterality Date   BUBBLE STUDY  08/31/2021   Procedure: BUBBLE STUDY;  Surgeon: Skeet Latch, MD;  Location: [place];  Service: Cardiovascular;;   IR ANGIO VERTEBRAL SEL SUBCLAVIAN INNOMINATE UNI L MOD SED  08/30/2021   IR ANGIO VERTEBRAL SEL SUBCLAVIAN INNOMINATE UNI R MOD SED  08/30/2021   IR CT HEAD LTD  08/30/2021   IR INTRA CRAN STENT  08/30/2021   IR US GUIDE VASC ACCESS RIGHT  08/30/2021   RADIOLOGY WITH ANESTHESIA N/A 08/30/2021   Procedure: IR WITH ANESTHESIA;  Surgeon: Luanne Bras, MD;  Location: [place];  Service: Radiology;  Laterality: N/A;   TEE WITHOUT CARDIOVERSION N/A 08/31/2021   Procedure: TRANSESOPHAGEAL ECHOCARDIOGRAM (TEE);  Surgeon: Skeet Latch, MD;  Location: [place];  Service: Cardiovascular;  Laterality: N/A;    Assessment / Plan / Recommendation Clinical Impression HPI: Maurice Little is a 65 year old RH-male with history of HTN, PAD who was admitted on 08/29/21 with onset of dizziness and headaches approximately day prior to admission followed by left-sided tingling for 2 hours the next day. 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 He developed right facial droop with right hearing loss, diplopia with nystagmus as well as slurred speech with nausea evening past admission. MRI brain done revealing acute infarct in right cerebellar hemisphere and small additional infarcts in right brachium  pontis and right pontomedullary junction.  Dr. Leonie Man felt that stroke was most likely secondary to atherosclerosis with R-severe occlusion and PA high-grade stenosis. MBS done showing mild oral dysphagia and dysphagia 3 with thin liquids recommended.   Oral motor exam remarkable for right sided facial weakness. Lingual ROM and strength appeared [facility]. Pt presented with mild-to-moderate dysarthria characterized by decreased articulatory precision limited by right facial and labial ROM and weakness, and hypernasality. Pt compensated well by implementing "slow and loud" strategies learned by ST in prior setting. Pt perceived at 75-90% intelligible at sentence level. Speech notable for echolalia.   Clinical bedside swallow evaluation completed and remarkable for coughing during 50% occasions when consuming thin liquids by straw vs. no coughing with cup sips. Pt presents with functional oropharyngeal swallow with regular solids though tendency to take rather large bites. Recommend cont current diet and thin liquids with NO STRAW. Both spouse and pt verbalized understanding and were able to teach back swallowing precautions.   Informal cognitive assessment completed. Pt was oriented to all concepts including location of room. He presented with adequate sustained and selective attention, insight into his deficits, and emergent awareness. Pt reported constant auditory stimulation of "freight train noises" and worsening vision in right eye. Notified MD and PA. Further cognitive-linguistic evaluation recommended.  Patient would benefit from skilled SLP intervention to maximize speech, monitor swallowing, and further assessment to maximize overall functional independence prior to discharge.    Skilled Therapeutic Interventions          Pt participated in non-standardized assessments of cognitive-linguistic, speech, and language function, as well as clinical bedside swallow eval.  Please see above.   SLP Assessment   Patient will need skilled Speech Lanaguage Pathology Services during CIR admission    Recommendations  SLP Diet Recommendations: Age appropriate regular solids;Thin Medication Administration: Whole meds with liquid Supervision: Staff to assist with self feeding;Full supervision/cueing for compensatory strategies Recommendations for Other Services: Neuropsych consult Follow up Recommendations: Outpatient SLP Equipment Recommended: None recommended by SLP    SLP Frequency 3 to 5 out of 7 days   SLP Duration  SLP Intensity  SLP Treatment/Interventions 2.5-3  Minumum of 1-2 x/day, 30 to 90 minutes  Other (comment);Dysphagia/aspiration precaution training;Functional tasks;Cueing hierarchy;Speech/Language facilitation (further cognitive-linguistic eval)    Pain Pain Assessment Pain Scale: 0-10 Pain Score: 0-No pain  Prior Functioning Cognitive/Linguistic Baseline: Within functional limits Type of Home: House  Lives With: Spouse Available Help at Discharge: Family;Available 24 hours/day Vocation: Full time employment  SLP Evaluation Cognition Overall Cognitive Status: Impaired/Different from baseline Arousal/Alertness: Awake/alert Orientation Level: Oriented X4 Year: 2022 Month: November Day of Week: Correct Attention: Focused;Sustained;Selective Focused Attention: Appears intact Sustained Attention: Appears intact Selective Attention: Appears intact Memory: Appears intact Awareness: Appears intact Behaviors: Other (comment) (echolalia) Safety/Judgment: Appears intact  Comprehension Auditory Comprehension Overall Auditory Comprehension: Appears within functional limits for tasks assessed Expression Expression Primary Mode of Expression: Verbal Verbal Expression Overall Verbal Expression: Appears within functional limits for tasks assessed Pragmatics: Impairment Impairments: Abnormal affect Oral Motor Oral Motor/Sensory Function Overall Oral Motor/Sensory  Function: Moderate impairment Facial ROM: Reduced right Facial Symmetry: Abnormal symmetry right;Suspected CN VII (facial) dysfunction Facial Strength: Reduced right;Suspected CN VII (facial) dysfunction Facial Sensation: Reduced right;Reduced left;Suspected CN V (Trigeminal) dysfunction Lingual ROM: Within Functional Limits Lingual Symmetry: Within Functional Limits Lingual Strength: Within Functional Limits Mandible: Impaired Motor Speech Overall Motor Speech: Impaired Respiration: Within functional limits Phonation: Low vocal intensity Resonance: Hypernasality Articulation: Impaired Level of Impairment: Phrase Intelligibility: Intelligibility reduced Word: 75-100% accurate Phrase: 75-100% accurate Sentence: 75-100% accurate Conversation: 75-100% accurate Motor Planning: Witnin functional limits Motor Speech Errors: Not applicable Effective Techniques: Slow rate;Increased vocal intensity;Over-articulate  Care Tool Care Tool Cognition Ability to hear (with hearing aid or hearing appliances if normally used Ability to hear (with hearing aid or hearing appliances if normally used): 1. Minimal difficulty - difficulty in some environments (e.g. when person speaks softly or setting is noisy)   Expression of Ideas and Wants Expression of Ideas and Wants: 3. Some difficulty - exhibits some difficulty with expressing needs and ideas (e.g, some words or finishing thoughts) or speech is not clear   Understanding Verbal and Non-Verbal Content Understanding Verbal and Non-Verbal Content: 3. Usually understands - understands most conversations, but misses some part/intent of message. Requires cues at times to understand  Memory/Recall Ability Memory/Recall Ability : Current season;That he or she is in a hospital/hospital unit;Location of own room   Intelligibility: Intelligibility reduced Word: 75-100% accurate Phrase: 75-100% accurate Sentence: 75-100% accurate Conversation: 75-100%  accurate  Bedside Swallowing Assessment General Date of Onset: 08/29/21 Previous Swallow Assessment: BSE 11/5 Diet Prior to this Study: Regular;Thin liquids Temperature Spikes Noted: No Respiratory Status: Room air History of Recent Intubation: No Behavior/Cognition: Alert;Cooperative;Pleasant mood Oral Cavity - Dentition: Adequate natural dentition Self-Feeding Abilities: Needs assist;Needs set up Vision: Functional for self-feeding Patient Positioning: Upright in bed Baseline Vocal Quality: Low vocal intensity Volitional Cough: Strong Volitional Swallow: Able to elicit  Oral Care Assessment Does patient have any of the following "high(er) risk" factors?: None of the above Does patient have any of the following "at risk"  factors?: None of the above Patient is LOW RISK: Follow universal precautions (see row information) Ice Chips Ice chips: Not tested Thin Liquid Thin Liquid: Impaired Presentation: Cup;Straw Oral Phase Impairments: Reduced labial seal Oral Phase Functional Implications: Right anterior spillage Pharyngeal  Phase Impairments: Cough - Immediate Other Comments: immediate cough noted with straw sips only. No coughing with cup sips Nectar Thick Nectar Thick Liquid: Not tested Honey Thick Honey Thick Liquid: Not tested Puree Puree: Impaired Presentation: Spoon Oral Phase Impairments: Reduced labial seal Solid Solid: Impaired Presentation: Self Fed Oral Phase Functional Implications: Oral residue;Prolonged oral transit BSE Assessment Risk for Aspiration Impact on safety and function: Mild aspiration risk  Short Term Goals: Week 1: SLP Short Term Goal 1 (Week 1): Patient will implement speech intelligiblity strategies at sentence level with min A verbal cues. SLP Short Term Goal 2 (Week 1): Patient will consume current diet with minimal-to-no overt s/sx of aspiration and sup A for implementation of swallowing safety SLP Short Term Goal 3 (Week 1): Patient  will participate in further cognitive-linguistic evaluation to further identify/manage functional needs  Refer to Care Plan for Long Term Goals  Recommendations for other services: Therapeutic Recreation  Pet therapy and Stress management  Discharge Criteria: Patient will be discharged from SLP if patient refuses treatment 3 consecutive times without medical reason, if treatment goals not met, if there is a change in medical status, if patient makes no progress towards goals or if patient is discharged from hospital.  The above assessment, treatment plan, treatment alternatives and goals were discussed and mutually agreed upon: by patient  Patty Sermons 09/05/2021, 1:09 PM

## 2021-09-05 NOTE — Progress Notes (Signed)
Inpatient Rehabilitation Care Coordinator Assessment and Plan Patient Details  Name: Maurice Little MRN: [id number] Date of Birth: 11/04/1955  Today's Date: 09/05/2021  Hospital Problems: Principal Problem:   Cerebellar stroke [facility])  Past Medical History:  Past Medical History:  Diagnosis Date   Hypertension    PAD (peripheral artery disease) ([place])    Past Surgical History:  Past Surgical History:  Procedure Laterality Date   BUBBLE STUDY  08/31/2021   Procedure: BUBBLE STUDY;  Surgeon: Skeet Latch, MD;  Location: [place];  Service: Cardiovascular;;   IR ANGIO VERTEBRAL SEL SUBCLAVIAN INNOMINATE UNI L MOD SED  08/30/2021   IR ANGIO VERTEBRAL SEL SUBCLAVIAN INNOMINATE UNI R MOD SED  08/30/2021   IR CT HEAD LTD  08/30/2021   IR INTRA CRAN STENT  08/30/2021   IR US GUIDE VASC ACCESS RIGHT  08/30/2021   RADIOLOGY WITH ANESTHESIA N/A 08/30/2021   Procedure: IR WITH ANESTHESIA;  Surgeon: Luanne Bras, MD;  Location: [place];  Service: Radiology;  Laterality: N/A;   TEE WITHOUT CARDIOVERSION N/A 08/31/2021   Procedure: TRANSESOPHAGEAL ECHOCARDIOGRAM (TEE);  Surgeon: Skeet Latch, MD;  Location: [place];  Service: Cardiovascular;  Laterality: N/A;   Social History:  reports that he has quit smoking. His smoking use included cigarettes. He has never used smokeless tobacco. He reports that he does not currently use alcohol. No history on file for drug use.  Family / Support Systems Marital Status: Married Patient Roles: Spouse Spouse/Significant Other: Maurice Little Other Supports: Veterinary surgeon (Mother) Anticipated Caregiver: Maurice Little (Spouse) Ability/Limitations of Caregiver: none Caregiver Availability: 24/7 Family Dynamics: support from spouse and other family  Social History Preferred language: English Religion:  Health Literacy - How often do you need to have someone help you when you read instructions, pamphlets, or other written material from your doctor or pharmacy?:  Never Writes: Yes Employment Status: Employed Name of Employer: [employer] Legal History/Current Legal Issues: n/a Guardian/Conservator: n/a   Abuse/Neglect Abuse/Neglect Assessment Can Be Completed: Yes Physical Abuse: Denies Verbal Abuse: Denies Sexual Abuse: Denies Exploitation of patient/patient's resources: Denies Self-Neglect: Denies  Patient response to: Social Isolation - How often do you feel lonely or isolated from those around you?: Never  Emotional Status Recent Psychosocial Issues: coping Psychiatric History: n/a Substance Abuse History: n/a  Patient / Family Perceptions, Expectations & Goals Pt/Family understanding of illness & functional limitations: yes Premorbid pt/family roles/activities: Previously driving and [facility] Anticipated changes in roles/activities/participation: spouse able to assit with some roles and tasks Pt/family expectations/goals: MIN : PT/OT MOD I:,SLP  [employer] Resources [employer] Scripts: None Premorbid Home Care/DME Agencies: None Transportation available at discharge: spouse able to transport Is the patient able to respond to transportation needs?: No Resource referrals recommended: Neuropsychology (coping)  Discharge Planning Living Arrangements: Spouse/significant other Support Systems: Spouse/significant other, Parent Type of Residence: Private residence Insurance Resources: Multimedia programmer (specify) Sports administrator) Financial Resources: Employment Financial Screen Referred: No Living Expenses: Education officer, community Management: Patient, Spouse Does the patient have any problems obtaining your medications?: No Home Management: Independent Patient/Family Preliminary Plans: spouse able to assit with med management Care Coordinator Barriers to Discharge: Lack of/limited family support, Insurance for SNF coverage, Decreased caregiver support, Incontinence, Neurogenic Bowel & Bladder Care Coordinator Anticipated Follow Up Needs: HH/OP Expected length of  stay: 16-18 Days  Clinical Impression SW met with patient and spouse, introduced self, explained role. Addressed additional questions and concerns. Pt spouse currently speaking with disability. No additional questions or concerns, sw will continue to follow up,  Dyanne Iha 09/05/2021, 1:37 PM

## 2021-09-05 NOTE — Progress Notes (Signed)
Per patient--has elevated PSA/BPH and has not followed up with GU. On review PSA 6.9--with painful and difficult caths per nursing. Will check UA and PSA in am. Patient now reports the freight train sound is no different--has been hearing it since admission but vision has changed. Discussed with Dr. Iver Nestle who reviewed CT and will follow up for consult.  We discussed results, patient's frustration re: multiple issues and discouragement with current issues.  Patient and wife agreeable to start antidepressant and would like follow up with Dr. Kieth Brightly.

## 2021-09-05 NOTE — Progress Notes (Signed)
Foley removed per order, pt tolerated fairly, had pain upon removal. Stated he was "sensitive". Pt educated on voiding trial, is agreeable.

## 2021-09-05 NOTE — Progress Notes (Signed)
Patient ID: Maurice Little, male   DOB: April 25, 1956, 65 y.o.   MRN: [id number] Team Conference Report to Patient/Family  Team Conference discussion was reviewed with the patient and caregiver, including goals, any changes in plan of care and target discharge date.  Patient and caregiver express understanding and are in agreement.  The patient has a target discharge date of  (ELOS 2-3 wks).  SW met with patient and spouse, introduced self, explained role. Addressed additional questions and concerns. Pt spouse currently speaking with disability. No additional questions or concerns, sw will continue to follow up,    Dyanne Iha 09/05/2021, 1:38 PM

## 2021-09-05 NOTE — Progress Notes (Signed)
Inpatient Rehabilitation  Patient information reviewed and entered into eRehab system by Jeziah Kretschmer M. Moxon Messler, M.A., CCC/SLP, PPS Coordinator.  Information including medical coding, functional ability and quality indicators will be reviewed and updated through discharge.    

## 2021-09-05 NOTE — Progress Notes (Signed)
PROGRESS NOTE   Subjective/Complaints:  Pt reports they took foley out- cannot pee at all- Is painful-  Also no BM for 4-5 days- will order Sorbitol 60cc.  Also noted pt hypotensive- BP dropped to 75/50 per wife-   Pt overall feels bad due to freight train in R ear- cannot hear and overall "bad"- due to stroke.    ROS: Depressed; they took foley foley out-  Pt denies SOB, abd pain, CP, N/V/C/D,    Objective:   CT HEAD WO CONTRAST ( )  Result Date: 09/05/2021 CLINICAL DATA:  Stroke, follow-up, new vision changes EXAM: CT HEAD WITHOUT CONTRAST TECHNIQUE: Contiguous axial images were obtained from the base of the skull through the vertex without intravenous contrast. COMPARISON:  08/29/2021 CT head, correlation is also made with 09/01/2021 MRI brain FINDINGS: Brain: Increased hypodensity in the right cerebellar hemisphere and pons compared to 08/29/2021, which largely correlates with the restricted diffusion on the 09/01/2021 exam. Additional focal area of hypodensity in the medulla (series 3, image 5 and series 6, image 31) does not correspond to an area of restricted diffusion on the prior MRI. No hemorrhage, mass, mass effect, or midline shift. Vascular: Interval stenting of the basilar artery. Skull: No acute osseous abnormality. Sinuses/Orbits: Mild mucosal thickening in the maxillary sinuses. Other: Trace fluid in right mastoid air cells. IMPRESSION: 1. Increased hypodensity in the right cerebellar hemisphere and pons, which largely correlates with an area of restricted diffusion, with an additional focal area of hypodensity in the medulla, which does not correspond to an area of restricted diffusion on the prior MRI and is concerning for additional area of infarction. 2. Interval stenting of the basilar artery. These results were called by telephone at the time of interpretation on 09/05/2021 at 5:47 pm to provider PAMELA LOVE , who  verbally acknowledged these results. Electronically Signed   By: Wiliam Ke M.D.   On: 09/05/2021 17:48   Recent Labs    09/04/21 0405 09/05/21 0511  WBC 12.0* 13.6*  HGB 13.4 13.4  HCT 40.2 40.0  PLT 270 278   Recent Labs    09/04/21 0405 09/05/21 0511  NA 135 135  K 4.0 4.7  CL 104 101  CO2 22 25  GLUCOSE 110* 95  BUN 17 19  CREATININE 0.78 0.93  CALCIUM 8.8* 9.1    Intake/Output Summary (Last 24 hours) at 09/05/2021 1954 Last data filed at 09/05/2021 1700 Gross per 24 hour  Intake 700 ml  Output 1900 ml  Net -1200 ml        Physical Exam: Vital Signs Blood pressure (!) 138/91, pulse 95, temperature 98.8 F (37.1 C), temperature source Oral, resp. rate 17, height 6' (1.829 m), SpO2 97 %.   General: awake, alert, appropriate, but appears depressed;  NAD HENT: tape on L eyeglass lens- dysconjugate gaze; no hearing on R CV: regular rate; no JVD Pulmonary: CTA B/L; no W/R/R- good air movement GI: soft, NT, ND, (+)BS Psychiatric: appropriate; depressed/flat affect; frustrated Neurological: Ox3; dysarthric  Musculoskeletal:     Cervical back: Normal range of motion. No rigidity.     Comments: 5/5 in RUE/RLE/LUE/LLE  Skin:    Comments: R  hand IV- look OK No skin breakdown seen  Neurological:     Mental Status: He is alert and oriented to person, place, and time.     Comments: Right facial weakness with flat,dysarthric speech. Diplopia worse on right with horizontal nystagmus--tape on left lens helping with correction. Ataxia with finger to nose Left>Right as well as decrease in LLE motor control. Sensory deficits left face, LUE and LLE.  Very mild slowed processing/but could be due to dysarthria.   Assessment/Plan: 1. Functional deficits which require 3+ hours per day of interdisciplinary therapy in a comprehensive inpatient rehab setting. Physiatrist is providing close team supervision and 24 hour management of active medical problems listed  below. Physiatrist and rehab team continue to assess barriers to discharge/monitor patient progress toward functional and medical goals  Care Tool:  Bathing    Body parts bathed by patient: Right arm, Left arm, Chest, Abdomen, Right upper leg, Left upper leg, Right lower leg, Left lower leg, Face   Body parts bathed by helper: Front perineal area, Buttocks     Bathing assist Assist Level: Maximal Assistance - Patient 24 - 49%     Upper Body Dressing/Undressing Upper body dressing   What is the patient wearing?: Pull over shirt    Upper body assist Assist Level: Minimal Assistance - Patient > 75%    Lower Body Dressing/Undressing Lower body dressing      What is the patient wearing?: Pants, Incontinence brief     Lower body assist Assist for lower body dressing: Maximal Assistance - Patient 25 - 49%     Toileting Toileting Toileting Activity did not occur (Clothing management and hygiene only): N/A (no void or bm) (foley in place)  Toileting assist Assist for toileting: Maximal Assistance - Patient 25 - 49%     Transfers Chair/bed transfer  Transfers assist     Chair/bed transfer assist level: Maximal Assistance - Patient 25 - 49%     Locomotion Ambulation   Ambulation assist   Ambulation activity did not occur: Safety/medical concerns (requires +2 and skilled assist with use of hallway rail)          Walk 10 feet activity   Assist  Walk 10 feet activity did not occur: Safety/medical concerns        Walk 50 feet activity   Assist Walk 50 feet with 2 turns activity did not occur: Safety/medical concerns         Walk 150 feet activity   Assist Walk 150 feet activity did not occur: Safety/medical concerns         Walk 10 feet on uneven surface  activity   Assist Walk 10 feet on uneven surfaces activity did not occur: Safety/medical concerns         Wheelchair     Assist Is the patient using a wheelchair?: Yes (currently  using TIS due to BP)   Wheelchair activity did not occur: N/A         Wheelchair 50 feet with 2 turns activity    Assist            Wheelchair 150 feet activity     Assist          Blood pressure (!) 138/91, pulse 95, temperature 98.8 F (37.1 C), temperature source Oral, resp. rate 17, height 6' (1.829 m), SpO2 97 %.    Medical Problem List and Plan: 1.  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  Antithrombotics: -DVT/anticoagulation:  Pharmaceutical: Lovenox added.              -antiplatelet therapy: ASA/Brilinta. 3. Pain Management: Tylenol prn for HA.  4. Mood: LCSW to follow for evaluation and support.              -antipsychotic agents: N/A 5. Neuropsych: This patient is capable of making decisions on his own behalf. 6. Skin/Wound Care: Routine pressure-relief measures. 7. Fluids/Electrolytes/Nutrition: Monitor I's/O.  Check CMet in AM 8.  HTN: Monitor BP TID.  Orthostatic symptoms reported today. --On hydralazine 25 mg 3 times daily, Cozaar 50 mg twice daily and amlodipine 10 mg daily.  11/9- orthostatic today- decreased losartan to 25 mg BID and decreased Norvasc to 5 mg daily- will add TEDs and ACE wraps and see how pt does.  --We will check orthostatic vital signs. 9.  Urinary retention: Likely exacerbated by constipation. Augment bowel program and remove foley in am.  --Continue Flomax--changed to bedtime to avoid orthostatic symptoms. 11/9- increased Flomax to 0.8 mg nightly, however will check U/A and Cx due to elevated WBC and retention.  10. Constipation: Will start patient on Senna S daily.  --MOM today.   11/9- didn't poop- give sorbitol 60cc x1 this  afternoon 11.  Leukocytosis: Monitor for fevers and other signs of infection.  11/9- check U/A and Cx- since more elevated- afebrile.  12.  Intermittent hypokalemia: Recheck CMET/Mg level in am.    --May need standing dose supplement is on hydralazine. 13.  Hyperglycemia: Likely stress related as Hemoglobin A1c 5.5. 14.  Dyslipidemia: Continue Lipitor. 15. 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  LOS: 1 days A FACE TO FACE EVALUATION WAS PERFORMED  Hannah Strader 09/05/2021, 7:54 PM

## 2021-09-05 NOTE — Plan of Care (Signed)
  Problem: RH Expression Communication Goal: LTG Patient will increase speech intelligibility (SLP) Description: LTG: Patient will increase speech intelligibility at word/phrase/conversation level with cues, % of the time (SLP) Flowsheets (Taken 09/05/2021 1308) LTG: Patient will increase speech intelligibility (SLP): Modified Independent   Problem: RH Swallowing Goal: LTG Patient will consume least restrictive diet using compensatory strategies with assistance (SLP) Description: LTG:  Patient will consume least restrictive diet using compensatory strategies with assistance (SLP) Flowsheets (Taken 09/05/2021 1308) LTG: Pt Patient will consume least restrictive diet using compensatory strategies with assistance of (SLP): Modified Independent

## 2021-09-05 NOTE — Consult Note (Signed)
[facility] Update:  This patient is being followed by the stroke team and was last seen by them yesterday. Will add to stroke list for evaluation tomorrow for the noted medullary stroke on [facility].  Erick Blinks [facility] Pager Number [phone]

## 2021-09-05 NOTE — Patient Care Conference (Signed)
Inpatient RehabilitationTeam Conference and Plan of Care Update Date: 09/05/2021   Time: 10:44 AM    Patient Name: Maurice Little      Medical Record Number: [id number]  Date of Birth: 07/13/1956 Sex: Male         Room/Bed: 4M09C/4M09C-01 Payor Info: Payor: Advertising copywriter / Plan: Advertising copywriter OTHER / Product Type: *No Product type* /    Admit Date/Time:  09/04/2021  3:06 PM  Primary Diagnosis:  Cerebellar stroke [facility])  Hospital Problems: Principal Problem:   Cerebellar stroke [facility])    Expected Discharge Date: Expected Discharge Date:  (ELOS 2-3 wks)  Team Members Present: Physician leading conference: Dr. Sula Soda Social Worker Present: Lavera Guise, BSW Nurse Present: Chana Bode, RN PT Present: Casimiro Needle, PT OT Present: Perrin Maltese, OT SLP Present: Eilene Ghazi, SLP PPS Coordinator present : Fae Pippin, SLP     Current Status/Progress Goal Weekly Team Focus  Bowel/Bladder   Foley in place. Remove on 11/9 at 0500 to begin voiding trial. Continent of bowel, constipated, LBM 11/2.  Constipation resolved, pt able to void on command.  in and out cath q6hr for no void. Toilet prn.   Swallow/Nutrition/ Hydration             ADL's             Mobility   eval pending         Communication             Safety/Cognition/ Behavioral Observations            Pain   C/O mild pain; prn tylenol  Pain <3/10  Assess Qshift and prn   Skin   Skin intact. Ecchymosis to bilateral wrists.  Maintain skin integrity.  Assess skin qshift and prn     Discharge Planning:  Assesment pending   Team Discussion: Patient with orthostasis; MD added TEDs. Flomax increased due to BPH/urinary retention and Constipation addressed.  Patient on target to meet rehab goals: Patient has diplopia and truncal ataxia with poor sensation on left side with poor coordination.  Currently supervision for upper body care and max assist for stand pivot transfers.  *See Care Plan  and progress notes for long and short-term goals.   Revisions to Treatment Plan:  None   Teaching Needs: Safety, transfers, toileting, medications, secondary risks, etc  Current Barriers to Discharge: Decreased caregiver support and Home enviroment access/layout  Possible Resolutions to Barriers: Family education     Medical Summary Current Status: BP down to 76/50s today, urinary retention, constipation, leukocytosis  Barriers to Discharge: Medical stability  Barriers to Discharge Comments: BP down to 76/50s today, urinary retention, constipation, leukocytosis Possible Resolutions to [employer] Focus: add TEDs, decrease losartan, add flomax, add sorbitol, monitor CBCs   Continued Need for Acute Rehabilitation Level of Care: The patient requires daily medical management by a physician with specialized training in physical medicine and rehabilitation for the following reasons: Direction of a multidisciplinary physical rehabilitation program to maximize functional independence : Yes Medical management of patient stability for increased activity during participation in an intensive rehabilitation regime.: Yes Analysis of laboratory values and/or radiology reports with any subsequent need for medication adjustment and/or medical intervention. : Yes   I attest that I was present, lead the team conference, and concur with the assessment and plan of the team.   Chana Bode B 09/05/2021, 4:11 PM

## 2021-09-05 NOTE — Progress Notes (Signed)
Neuro APP consulted for consult due to new CT changes.

## 2021-09-05 NOTE — Plan of Care (Signed)
  Problem: RH Balance Goal: LTG Patient will maintain dynamic sitting balance (PT) Description: LTG:  Patient will maintain dynamic sitting balance with assistance during mobility activities (PT) Flowsheets (Taken 09/05/2021 1839) LTG: Pt will maintain dynamic sitting balance during mobility activities with:: Supervision/Verbal cueing Goal: LTG Patient will maintain dynamic standing balance (PT) Description: LTG:  Patient will maintain dynamic standing balance with assistance during mobility activities (PT) Flowsheets (Taken 09/05/2021 1839) LTG: Pt will maintain dynamic standing balance during mobility activities with:: Minimal Assistance - Patient > 75%   Problem: Sit to Stand Goal: LTG:  Patient will perform sit to stand with assistance level (PT) Description: LTG:  Patient will perform sit to stand with assistance level (PT) Flowsheets (Taken 09/05/2021 1839) LTG: PT will perform sit to stand in preparation for functional mobility with assistance level: Contact Guard/Touching assist   Problem: RH Bed Mobility Goal: LTG Patient will perform bed mobility with assist (PT) Description: LTG: Patient will perform bed mobility with assistance, with/without cues (PT). Flowsheets (Taken 09/05/2021 1839) LTG: Pt will perform bed mobility with assistance level of: Supervision/Verbal cueing   Problem: RH Bed to Chair Transfers Goal: LTG Patient will perform bed/chair transfers w/assist (PT) Description: LTG: Patient will perform bed to chair transfers with assistance (PT). Flowsheets (Taken 09/05/2021 1839) LTG: Pt will perform Bed to Chair Transfers with assistance level: Contact Guard/Touching assist   Problem: RH Car Transfers Goal: LTG Patient will perform car transfers with assist (PT) Description: LTG: Patient will perform car transfers with assistance (PT). Flowsheets (Taken 09/05/2021 1839) LTG: Pt will perform car transfers with assist:: Contact Guard/Touching assist   Problem: RH  Ambulation Goal: LTG Patient will ambulate in controlled environment (PT) Description: LTG: Patient will ambulate in a controlled environment, # of feet with assistance (PT). Flowsheets (Taken 09/05/2021 1839) LTG: Pt will ambulate in controlled environ  assist needed:: Contact Guard/Touching assist LTG: Ambulation distance in controlled environment: 153ft using LRAD Goal: LTG Patient will ambulate in home environment (PT) Description: LTG: Patient will ambulate in home environment, # of feet with assistance (PT). Flowsheets (Taken 09/05/2021 1839) LTG: Pt will ambulate in home environ  assist needed:: Contact Guard/Touching assist LTG: Ambulation distance in home environment: 31ft using LRAD   Problem: RH Stairs Goal: LTG Patient will ambulate up and down stairs w/assist (PT) Description: LTG: Patient will ambulate up and down # of stairs with assistance (PT) Flowsheets (Taken 09/05/2021 1839) LTG: Pt will ambulate up/down stairs assist needed:: Contact Guard/Touching assist LTG: Pt will  ambulate up and down number of stairs: 4 steps using B HRs

## 2021-09-06 ENCOUNTER — Inpatient Hospital Stay (HOSPITAL_COMMUNITY): Payer: 59

## 2021-09-06 DIAGNOSIS — I639 Cerebral infarction, unspecified: Secondary | ICD-10-CM | POA: Diagnosis not present

## 2021-09-06 LAB — CBC WITH DIFFERENTIAL/PLATELET
Abs Immature Granulocytes: 0.08 10*3/uL — ABNORMAL HIGH (ref 0.00–0.07)
Basophils Absolute: 0.1 10*3/uL (ref 0.0–0.1)
Basophils Relative: 1 %
Eosinophils Absolute: 0.6 10*3/uL — ABNORMAL HIGH (ref 0.0–0.5)
Eosinophils Relative: 6 %
HCT: 40.7 % (ref 39.0–52.0)
Hemoglobin: 13.8 g/dL (ref 13.0–17.0)
Immature Granulocytes: 1 %
Lymphocytes Relative: 17 %
Lymphs Abs: 1.6 10*3/uL (ref 0.7–4.0)
MCH: 31.9 pg (ref 26.0–34.0)
MCHC: 33.9 g/dL (ref 30.0–36.0)
MCV: 94 fL (ref 80.0–100.0)
Monocytes Absolute: 1.1 10*3/uL — ABNORMAL HIGH (ref 0.1–1.0)
Monocytes Relative: 12 %
Neutro Abs: 5.7 10*3/uL (ref 1.7–7.7)
Neutrophils Relative %: 63 %
Platelets: 290 10*3/uL (ref 150–400)
RBC: 4.33 MIL/uL (ref 4.22–5.81)
RDW: 13.4 % (ref 11.5–15.5)
WBC: 9.1 10*3/uL (ref 4.0–10.5)
nRBC: 0 % (ref 0.0–0.2)

## 2021-09-06 LAB — BASIC METABOLIC PANEL
Anion gap: 9 (ref 5–15)
BUN: 14 mg/dL (ref 8–23)
CO2: 22 mmol/L (ref 22–32)
Calcium: 9 mg/dL (ref 8.9–10.3)
Chloride: 105 mmol/L (ref 98–111)
Creatinine, Ser: 0.83 mg/dL (ref 0.61–1.24)
GFR, Estimated: >60 mL/min (ref >60)
Glucose, Bld: 119 mg/dL — ABNORMAL HIGH (ref 70–99)
Potassium: 4.1 mmol/L (ref 3.5–5.1)
Sodium: 136 mmol/L (ref 135–145)

## 2021-09-06 LAB — URINE CULTURE: Culture: NO GROWTH

## 2021-09-06 LAB — PSA: Prostatic Specific Antigen: 3.32 ng/mL (ref 0.00–4.00)

## 2021-09-06 MED ORDER — CHLORHEXIDINE GLUCONATE CLOTH 2 % EX PADS
6.0000 | MEDICATED_PAD | Freq: Every day | CUTANEOUS | Status: DC
  Administered 2021-09-06 – 2021-09-26 (×21): 6 via TOPICAL

## 2021-09-06 MED ORDER — HYDRALAZINE HCL 10 MG PO TABS
10.0000 mg | ORAL_TABLET | Freq: Four times a day (QID) | ORAL | Status: DC
  Administered 2021-09-06: 10 mg via ORAL
  Filled 2021-09-06: qty 1

## 2021-09-06 MED ORDER — AMLODIPINE BESYLATE 5 MG PO TABS
5.0000 mg | ORAL_TABLET | Freq: Every day | ORAL | Status: DC
  Administered 2021-09-07 – 2021-09-26 (×20): 5 mg via ORAL
  Filled 2021-09-06 (×20): qty 1

## 2021-09-06 NOTE — Progress Notes (Signed)
SLP Cancellation Note  Patient Details Name: Maurice Little MRN: [id number] DOB: 12/13/1955   Cancelled treatment: Pt missed 45 mins of skilled ST intervention secondary to medical hold. Will continue to follow and see as MD allows.                                                                                                Tamala Ser 09/06/2021, 12:15 PM

## 2021-09-06 NOTE — Progress Notes (Signed)
Patient ID: Atwell Mcdanel, male   DOB: 1956-01-31, 65 y.o.   MRN: [id number] Met with the patient and wife to introduce self and role. Reviewed educational notebook and secondary risk factors including HTN, HLD (LDL 220 and total chol 288). Reviewed ASA and Brilinta therapy per MD. Discussed dietary modifications and wife asking for more information about nutrition; dietician consult added. Patient also very emotionally labile; neuropsych consult requested. Trouble voiding post foley; I+O cath q 6 hours and addressing constipation. Continue to follow along to discharge to address educational needs and collaborate with the team to facilitate smooth discharge home. Margarito Liner, RN

## 2021-09-06 NOTE — Progress Notes (Signed)
Occupational Therapy Note  Patient Details  Name: Trysten Bernard MRN: [id number] Date of Birth: 1956-10-05   Pt missed 60 mins OT secondary to still being on bedrest.  Will continue to follow and see as MD allows.   Tobby Fawcett OTR/L 09/06/2021, 12:03 PM

## 2021-09-06 NOTE — Progress Notes (Signed)
Patient continues to have significant orthostatic changes--hydralazine decreased to 10 mg TID and Norvasc changed to evenings after supper. Will add hold parameters on all BP if SBP<120.

## 2021-09-06 NOTE — Progress Notes (Signed)
STROKE TEAM PROGRESS NOTE   SUBJECTIVE (INTERVAL HISTORY) Patient is lying in bed, awake, alert and in NAD. Glasses in face with left sided tape for diplopia.  His wife is at bedside. Patient complained of dizziness and blurred vision 2 days ago when he stood up and was working with therapist.  He felt better after he lay flat.  He has not been doing therapy for the last 2 days.  CT scan of the head done yesterday was obtained which shows a new low-density in the medulla raising concern for interval new stroke and increased hypodensity in the right cerebellar hemisphere and pons correlating with his recent infarct on the MRI.  And stroke team was asked to come back and evaluate him and to clear him for ongoing therapies.  Chest x-Royce shows no acute abnormality Vital signs are stable.  Neurological exam unchanged.    OBJECTIVE Temp:  [98.8 F (37.1 C)-100 F (37.8 C)] 100 F (37.8 C) (11/10 0527) Pulse Rate:  [92-95] 93 (11/10 0527) Resp:  [17-18] 18 (11/10 0527) BP: (136-149)/(87-98) 136/87 (11/10 1405) SpO2:  [97 %-100 %] 100 % (11/10 0527)  Recent Labs  Lab 09/04/21 0007 09/04/21 0411 09/04/21 0808 09/04/21 1328 09/04/21 1629  GLUCAP 113* 116* 128* 158* 110*   Recent Labs  Lab 09/02/21 0504 09/03/21 0214 09/04/21 0405 09/05/21 0511 09/06/21 0830  NA 137 135 135 135 136  K 3.9 3.1* 4.0 4.7 4.1  CL 105 102 104 101 105  CO2 _0 GLUCOSE 113* 103* 110* 95 119*  BUN _1 CREATININE 0.77 0.76 0.78 0.93 0.83  CALCIUM 8.9 8.7* 8.8* 9.1 9.0  MG  --   --   --  2.1  --    Recent Labs  Lab 09/05/21 0511  AST 27  ALT 44  ALKPHOS 73  BILITOT 0.6  PROT 6.2*  ALBUMIN 3.3*   Recent Labs  Lab 09/02/21 0504 09/03/21 0214 09/04/21 0405 09/05/21 0511 09/06/21 0830  WBC 10.8* 13.6* 12.0* 13.6* 9.1  NEUTROABS  --   --   --  9.6* 5.7  HGB 13.2 12.8* 13.4 13.4 13.8  HCT 39.3 39.3 40.2 40.0 40.7  MCV 95.6 96.1 95.0 95.9 94.0  PLT 264 267 270 278 290    No results for input(s): CKTOTAL, CKMB, CKMBINDEX, TROPONINI in the last 168 hours. No results for input(s): LABPROT, INR in the last 72 hours.  Recent Labs    09/05/21 2217  COLORURINE STRAW*  LABSPEC 1.008  PHURINE 7.0  GLUCOSEU NEGATIVE  HGBUR MODERATE*  BILIRUBINUR NEGATIVE  KETONESUR NEGATIVE  PROTEINUR NEGATIVE  NITRITE NEGATIVE  LEUKOCYTESUR NEGATIVE       Component Value Date/Time   CHOL 288 (H) 08/29/2021 2340   TRIG 82 08/29/2021 2340   HDL 52 08/29/2021 2340   CHOLHDL 5.5 08/29/2021 2340   VLDL 16 08/29/2021 2340   LDLCALC 220 (H) 08/29/2021 2340   Lab Results  Component Value Date   HGBA1C 5.5 08/29/2021      Component Value Date/Time   LABOPIA NONE DETECTED 08/30/2021 0751   COCAINSCRNUR NONE DETECTED 08/30/2021 0751   LABBENZ NONE DETECTED 08/30/2021 0751   AMPHETMU NONE DETECTED 08/30/2021 0751   THCU POSITIVE (A) 08/30/2021 0751   LABBARB NONE DETECTED 08/30/2021 0751    No results for input(s): ETH in the last 168 hours.   DG Chest 2 View  Result Date: 09/06/2021 CLINICAL DATA:  Leukocytosis EXAM: CHEST - 2  VIEW COMPARISON:  None. FINDINGS: Heart size and mediastinal contours are within normal limits. No suspicious pulmonary opacities identified. No pleural effusion or pneumothorax visualized. No acute osseous abnormality appreciated. IMPRESSION: No acute intrathoracic process identified. Electronically Signed   By: Ofilia Neas M.D.   On: 09/06/2021 09:31   CT HEAD WO CONTRAST (5MM)  Result Date: 09/05/2021 CLINICAL DATA:  Stroke, follow-up, new vision changes EXAM: CT HEAD WITHOUT CONTRAST TECHNIQUE: Contiguous axial images were obtained from the base of the skull through the vertex without intravenous contrast. COMPARISON:  08/29/2021 CT head, correlation is also made with 09/01/2021 MRI brain FINDINGS: Brain: Increased hypodensity in the right cerebellar hemisphere and pons compared to 08/29/2021, which largely correlates with the  restricted diffusion on the 09/01/2021 exam. Additional focal area of hypodensity in the medulla (series 3, image 5 and series 6, image 31) does not correspond to an area of restricted diffusion on the prior MRI. No hemorrhage, mass, mass effect, or midline shift. Vascular: Interval stenting of the basilar artery. Skull: No acute osseous abnormality. Sinuses/Orbits: Mild mucosal thickening in the maxillary sinuses. Other: Trace fluid in right mastoid air cells. IMPRESSION: 1. Increased hypodensity in the right cerebellar hemisphere and pons, which largely correlates with an area of restricted diffusion, with an additional focal area of hypodensity in the medulla, which does not correspond to an area of restricted diffusion on the prior MRI and is concerning for additional area of infarction. 2. Interval stenting of the basilar artery. These results were called by telephone at the time of interpretation on 09/05/2021 at 5:47 pm to provider PAMELA LOVE , who verbally acknowledged these results. Electronically Signed   By: Merilyn Baba M.D.   On: 09/05/2021 17:48   MR BRAIN WO CONTRAST  Result Date: 09/01/2021 CLINICAL DATA:  Stroke, follow-up. 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 EXAM: MRI HEAD WITHOUT CONTRAST TECHNIQUE: Multiplanar, multiecho pulse sequences of the brain and surrounding structures were obtained without intravenous contrast. COMPARISON:  Head MRI 08/29/2021 FINDINGS: A limited examination was performed at the request of the ordering provider consisting of axial and coronal diffusion weighted imaging and axial susceptibility weighted imaging. There is a large acute infarct in the right cerebellar hemisphere in the PICA territory which has increased in size from the prior MRI. Additional new patchy acute right cerebellar infarcts are present in the AICA territory, and there are also new and larger acute infarcts involving  the right middle cerebellar peduncle, right medulla, and right greater than left pons. New punctate acute infarcts are noted in the medial aspects of the left parietal and left occipital lobes. There is a small amount of petechial hemorrhage associated with the right PICA infarct. 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 Electronically Signed   By: Logan Bores M.D.   On: 09/01/2021 19:52   MR BRAIN WO CONTRAST  Result Date: 08/29/2021 CLINICAL DATA:  Stroke, follow up EXAM: MRI HEAD WITHOUT CONTRAST TECHNIQUE: Multiplanar, multiecho pulse sequences of the brain and surrounding structures were obtained without intravenous contrast. COMPARISON:  None. FINDINGS: Brain: Reduced diffusion is present in the inferior right cerebellum. Additional small foci of involvement in the right brachium pontis and right pontomedullary junction. No significant mass effect. No evidence of intracranial hemorrhage. Patchy foci of T2 hyperintensity in the supratentorial white matter are nonspecific but may reflect mild chronic microvascular ischemic changes. Ventricles and sulci are within normal limits in  size and configuration. Vascular: Major vessel flow voids at the skull base are preserved. Skull and upper cervical spine: Normal marrow signal is preserved. Sinuses/Orbits: Paranasal sinus mucosal thickening. Orbits are unremarkable. Other: Sella is unremarkable.  Mastoid air cells are clear. 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 Small additional involvement of right brachium pontis and right pontomedullary junction. No hemorrhage or significant mass effect. Mild chronic microvascular ischemic changes. Electronically Signed   By: Macy Mis M.D.   On: 08/29/2021 13:03   IR Intra Cran Stent  Result Date: 09/02/2021 CLINICAL DATA:  History of progressive onset of dysarthria, nausea, vomiting and left-sided numbness. Patient also developed diplopia.  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 EXAM: INTRACRANIAL STENT (INCL PTA) COMPARISON:  CT angiogram of the head and neck of August 29, 2021, and MRI of the brain of August 29, 2021. MEDICATIONS: Heparin 4,000 units IV. Ancef 2 g IV antibiotic was administered within 1 hour of the procedure. ANESTHESIA/SEDATION: Mac anesthesia as per Department of Anesthesiology for diagnostic portion followed by general anesthesia for endovascular treatment portion. CONTRAST:  Omnipaque 300 approximately 150 mL. FLUOROSCOPY TIME:  Fluoroscopy Time: 41 minutes 18 seconds (2310 mGy). COMPLICATIONS: None immediate. TECHNIQUE: Informed written consent was obtained from the patient after a thorough discussion of the procedural risks, benefits and alternatives. All questions were addressed. Maximal Sterile Barrier Technique was utilized including caps, mask, sterile gowns, sterile gloves, sterile drape, hand hygiene and skin antiseptic. A timeout was performed prior to the initiation of the procedure. The right forearm to the wrist was prepped and draped in the usual sterile manner. The right radial artery was then identified with ultrasound and its morphology documented. A dorsal palmar anastomosis was verified to be present. Using ultrasound guidance, and a micropuncture set access into the right radial artery was obtained with a 6/7 French radial sheath. The obturator, and the guidewire were removed. Good aspiration was obtained from the side port of the sheath. Cocktail of 2000 units of heparin, 2.5 mg of verapamil, and 200 mcg of nitroglycerin was then infused through the sheath in diluted form without event. A right radial arteriogram was performed. Over a 0.035 inch Roadrunner guidewire, a 5 [place] Simmons 2 diagnostic catheter was advanced to the  aortic arch region, and arteriograms were performed of the right common carotid artery, the left common carotid artery, the left vertebral artery, and the right subclavian artery at the origin of stump of the occluded right vertebral artery. FINDINGS: The diagnostic study was somewhat marred by persistent motion artifact. The left common carotid arteriogram demonstrates the left external carotid artery and its major branches to be widely patent. The left internal carotid artery at the bulb demonstrates approximately 50% stenosis without evidence of intraluminal filling defects. More distally, the left internal carotid artery is seen to opacify to the cranial skull base. 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 A left posterior communicating artery is seen opacifying the left posterior cerebral artery distribution. The left middle cerebral artery and the left anterior cerebral artery opacify into the capillary and venous phases. Non dominant left vertebral artery demonstrates patency at its origin with brisk ascent to the cranial skull base to supply primarily the ipsilateral left posteroinferior cerebellar artery. The right common carotid arteriogram demonstrates the  right external carotid artery and its major branches to be widely patent. The right internal carotid artery at the bulb has approximately 90% stenosis due to a circumferential smooth plaque. More distally, the vessel is seen to opacify to the cranial skull base. The petrous, the cavernous and the supraclinoid segments are widely patent. A right posterior communicating artery is seen opacifying the right posterior cerebral artery distribution. The right middle and the right anterior cerebral arteries opacify into the capillary and venous phases. The right subclavian arteriogram at the origin of the occluded right vertebral artery  demonstrates partial reconstitution of the right vertebral artery at the level of C3-C4 from collaterals arising from the cervical branch of the thyrocervical trunk with opacification noted more distally into the dominant right vertebrobasilar junction. Opacification is noted into the basilar artery, and retrogradely in the right vertebral artery to just above the site of the occlusion in the proximal right vertebral artery. ENDOVASCULAR REVASCULARIZATION OF OCCLUDED DOMINANT PROXIMAL RIGHT VERTEBRAL ARTERY WITH STENT ASSISTED ANGIOPLASTY, AND OF THE MID BASILAR ARTERY WITH STENT ASSISTED ANGIOPLASTY Over the exchange 035 inch 300 cm Rosen exchange guidewire, the combination of a 95 cm Benchmark guide catheter with a support 5.5 [place] Berenstein catheter was advanced and positioned just proximal to the stump of the occluded dominant right vertebral artery. The guidewire and the support catheter were gently retrieved and removed. Good aspiration was obtained from the hub of the Benchmark guide catheter. Bilateral roadmap was then obtained centered over the vertebral artery origin and distally through the [facility] guide catheter. Over an 0.014 inch standard Synchro micro guidewire with a moderate J configuration, inside of an 021 160 cm Trevo Trak microcatheter combination was advanced to just proximal to the occluded dominant right vertebral artery. Using a torque device with gentle manipulation access was obtained with the micro guidewire through the occluded right vertebral artery without difficulty. The guidewire was advanced to the vertebral artery followed by the microcatheter. The guidewire was removed. Good aspiration obtained from the hub of microcatheter. A control arteriogram was then performed through the microcatheter distally with opacification noted in the right vertebrobasilar junction, the basilar artery, the right posterior cerebral artery, the superior cerebellar arteries and the anterior-inferior  cerebellar arteries. Also noted was significant narrowing secondary to an eccentric arteriosclerotic plaque in the mid basilar artery just proximal to the origin of right anterior inferior cerebellar artery. The microcatheter was then exchanged under constant fluoroscopic guidance for an 014 inch 300 cm Synchro standard micro guidewire with a moderate J configuration at its tip. Measurements were then performed of the most proximal portion of the right vertebral artery from the previous diagnostic arteriogram. It was decided to proceed with initial balloon angioplasty of the origin of the occluded right vertebral artery with a 2.5 mm x 15 mm Apex Monorail angioplasty balloon. This was prepped and purged antegradely with heparinized saline infusion, and retrogradely with 75% contrast and 25% heparinized saline infusion. Using rapid exchange technique, this was then advanced and positioned at the origin of the occluded right vertebral artery with the proximal marker proximal to the origin of the right vertebral artery. Control inflation was then performed using micro inflation syringe device via micro tubing to approximately 8 atmospheres where it was maintained for approximately 15 seconds. The balloon was deflated and retrieved and removed. Control arteriogram performed through [facility] guide catheter demonstrated significantly improved caliber and flow through the angioplastied segment with copious flow noted intracranially. Intracranially again demonstrated was significant mid basilar  artery stenosis secondary to a soft eccentric plaque. Patency was noted of the posterior cerebral arteries, the superior cerebellar arteries and faintly the anterior-inferior cerebellar arteries bilaterally. Significantly improved caliber was also noted at the origin of the right vertebral artery. A 120 cm Phenom Plus catheter was then advanced over the exchange micro guidewire under fluoroscopic guidance and positioned in the  distal right vertebral artery. The Benchmark catheter was now advanced without difficulty into the distal right vertebral artery also. A roadmap was then obtained. Using a torque device, the exchange micro guidewire was safely advanced under fluoroscopic guidance into the proximal basilar artery and then the distal basilar artery into the right posterior cerebral artery P1 segment. Measurements were then performed of the basilar artery distal and proximal to the prominent stenosis. It was decided to proceed with placement of a Resolute Onyx 3 mm x 15 mm balloon mounted stent intracranially. However, the stented stent delivery apparatus could not be advanced through the 045 Phenom Plus catheter. The Phenom Plus catheter was removed with the wire maintained in the right P1 segment. The Benchmark guide catheter was easily then advanced into the right vertebrobasilar junction just proximal to the basilar artery. The Resolute Onyx which had been prepped with heparinized saline infusion retrogradely and angiographically with 60% contrast and 40% heparinized saline infusion was reintroduced and advanced without difficulty over the exchange wire to the distal end of the Benchmark guide catheter. This stent apparatus was easily advanced and positioned with the proximal and the distal markers adequate distant from the site of the significant mid basilar artery stenosis. The stent was then deployed by inflating balloon using a micro inflation syringe device via micro tubing. Balloon was expanded to approximately 2.9 mm. The balloon was then deflated and retrieved. A control arteriogram performed through the Benchmark guide catheter demonstrated excellent apposition and flow through the stented mid basilar artery. Patency was seen of the posterior cerebral arteries, the superior cerebellar arteries and faintly the anterior-inferior cerebellar arteries. The Benchmark catheter was then retrieved more proximally while the exchange  wire was retrieved into the V4 segment of the right vertebral artery. The Benchmark catheter was retrieved to just proximal to the origin of the right vertebral artery. Measurements were again performed of the vertebral artery at this site. It was decided to proceed with placement of a 3.5 mm x 18 mm Onyx Frontier balloon mountable stent. After having been prepped in the usual manner, this was then advanced and positioned such that the proximal portion of the device was just proximal to the origin of the right vertebral artery. Thereafter, controlled inflation was performed of the balloon mountable stent to approximately 8 atmospheres. This was maintained for approximately 10 seconds. The balloon was then deflated and retrieved and removed. A control arteriogram performed through the Benchmark guide catheter demonstrated excellent apposition and coverage of the previously occluded right vertebral artery origin and proximal portion. Free flow was now noted through the proximal stent into the intracranial portion with patency maintained of the positioned mid basilar artery stent. Control arteriograms were then performed at 15 and 25 minutes post deployment of the devices. These continued to demonstrate excellent apposition with flow extra cranially and intracranially. The patient at this time was also given approximately 4 mg of intra-arterial Integrilin in order to prevent formation of platelet aggregation at the stented site. None was obvious on diagnostic catheter arteriograms. The exchange guidewire was then retrieved and removed. A final control arteriogram performed through the Benchmark catheter  and just proximal to the right vertebral artery origin continued to demonstrate excellent flow through the proximal stent and extra cranially and intracranially. There continued to be excellent flow through the basilar artery into the posterior cerebral arteries, the superior cerebellar arteries with faint opacification  of both anterior-inferior cerebellar arteries. The Benchmark guide sheath was removed. The radial sheath was then removed with successful hemostasis with a wrist band at the right radial puncture site. Distal right radial pulse was verified to be present. A CT of the brain performed on the table demonstrated no evidence of hemorrhage or hydrocephalus. The patient was then extubated. Upon recovery, the patient was able to converse appropriately. He denied any nausea, vomiting or headaches. He was able to move all 4 extremities spontaneously and to command. His pupils were 2 mm equal and sluggish. No change was seen in the right facial droop. The tongue remained in the midline. The patient was then returned to his room in the ICU in stable condition to continue on low-dose IV heparin and 81 aspirin daily, and Brilinta 90 mg b.i.d. Early in the evening, the patient was more awake, alert and appropriately responsive. He continued to have dysarthria with right facial droop. Neurological examination continued to have non sustained nystagmus on right lateral gaze and to a lesser degree left lateral gaze with oscillopsia on the right lateral gaze unchanged. 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 PLAN: Follow-up in clinic 2 weeks post discharge. Electronically Signed   By: Luanne Bras M.D.   On: 09/02/2021 05:39   IR CT Head Ltd  Result Date: 09/02/2021 CLINICAL DATA:  History of progressive onset of dysarthria, nausea, vomiting and left-sided numbness. Patient also developed diplopia. 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 EXAM: INTRACRANIAL STENT (INCL PTA) COMPARISON:  CT angiogram of the head and neck of August 29, 2021, and MRI of the brain of August 29, 2021. MEDICATIONS: Heparin 4,000 units IV. Ancef 2 g IV antibiotic was administered within 1 hour of the procedure. ANESTHESIA/SEDATION: Mac anesthesia as per Department of Anesthesiology for diagnostic portion followed by general anesthesia for endovascular treatment portion. CONTRAST:  Omnipaque 300 approximately 150 mL. FLUOROSCOPY TIME:  Fluoroscopy Time: 41 minutes 18 seconds (2310 mGy). COMPLICATIONS: None immediate. TECHNIQUE: Informed written consent was obtained from the patient after a thorough discussion of the procedural risks, benefits and alternatives. All questions were addressed. Maximal Sterile Barrier Technique was utilized including caps, mask, sterile gowns, sterile gloves, sterile drape, hand hygiene and skin antiseptic. A timeout was performed prior to the initiation of the procedure. The right forearm to the wrist was prepped and draped in the usual sterile manner. The right radial artery was then identified with ultrasound and its morphology documented. A dorsal palmar anastomosis was verified to be present. Using ultrasound guidance, and a micropuncture set access into the right radial artery was obtained with a 6/7 French radial sheath. The obturator, and the guidewire were removed. Good aspiration was obtained from the side port of the sheath. Cocktail of 2000 units of heparin, 2.5 mg of verapamil, and 200 mcg of nitroglycerin was then infused through the sheath in diluted form without event. A right radial arteriogram was  performed. Over a 0.035 inch Roadrunner guidewire, a 5 [place] Simmons 2 diagnostic catheter was advanced to the aortic arch region, and arteriograms were performed of the right common carotid artery, the left common carotid artery, the left vertebral artery, and the right subclavian artery at the origin of stump of the  occluded right vertebral artery. FINDINGS: The diagnostic study was somewhat marred by persistent motion artifact. The left common carotid arteriogram demonstrates the left external carotid artery and its major branches to be widely patent. The left internal carotid artery at the bulb demonstrates approximately 50% stenosis without evidence of intraluminal filling defects. More distally, the left internal carotid artery is seen to opacify to the cranial skull base. 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 A left posterior communicating artery is seen opacifying the left posterior cerebral artery distribution. The left middle cerebral artery and the left anterior cerebral artery opacify into the capillary and venous phases. Non dominant left vertebral artery demonstrates patency at its origin with brisk ascent to the cranial skull base to supply primarily the ipsilateral left posteroinferior cerebellar artery. The right common carotid arteriogram demonstrates the right external carotid artery and its major branches to be widely patent. The right internal carotid artery at the bulb has approximately 90% stenosis due to a circumferential smooth plaque. More distally, the vessel is seen to opacify to the cranial skull base. The petrous, the cavernous and the supraclinoid segments are widely patent. A right posterior communicating artery is seen opacifying the right posterior cerebral artery distribution. The right middle and the right anterior cerebral arteries opacify into the capillary and venous phases. The right subclavian arteriogram at the origin of the occluded right vertebral artery demonstrates partial reconstitution of the right vertebral artery at the level of C3-C4 from collaterals arising from the cervical branch of the thyrocervical trunk with opacification noted more distally into the  dominant right vertebrobasilar junction. Opacification is noted into the basilar artery, and retrogradely in the right vertebral artery to just above the site of the occlusion in the proximal right vertebral artery. ENDOVASCULAR REVASCULARIZATION OF OCCLUDED DOMINANT PROXIMAL RIGHT VERTEBRAL ARTERY WITH STENT ASSISTED ANGIOPLASTY, AND OF THE MID BASILAR ARTERY WITH STENT ASSISTED ANGIOPLASTY Over the exchange 035 inch 300 cm Rosen exchange guidewire, the combination of a 95 cm Benchmark guide catheter with a support 5.5 [place] Berenstein catheter was advanced and positioned just proximal to the stump of the occluded dominant right vertebral artery. The guidewire and the support catheter were gently retrieved and removed. Good aspiration was obtained from the hub of the Benchmark guide catheter. Bilateral roadmap was then obtained centered over the vertebral artery origin and distally through the [facility] guide catheter. Over an 0.014 inch standard Synchro micro guidewire with a moderate J configuration, inside of an 021 160 cm Trevo Trak microcatheter combination was advanced to just proximal to the occluded dominant right vertebral artery. Using a torque device with gentle manipulation access was obtained with the micro guidewire through the occluded right vertebral artery without difficulty. The guidewire was advanced to the vertebral artery followed by the microcatheter. The guidewire was removed. Good aspiration obtained from the hub of microcatheter. A control arteriogram was then performed through the microcatheter distally with opacification noted in the right vertebrobasilar junction, the basilar artery, the right posterior cerebral artery, the superior cerebellar arteries and the anterior-inferior cerebellar arteries. Also noted was significant narrowing secondary to an eccentric  arteriosclerotic plaque in the mid basilar artery just proximal to the origin of right anterior inferior cerebellar artery. The  microcatheter was then exchanged under constant fluoroscopic guidance for an 014 inch 300 cm Synchro standard micro guidewire with a moderate J configuration at its tip. Measurements were then performed of the most proximal portion of the right vertebral artery from the previous diagnostic arteriogram. It was decided to proceed with initial balloon angioplasty of the origin of the occluded right vertebral artery with a 2.5 mm x 15 mm Apex Monorail angioplasty balloon. This was prepped and purged antegradely with heparinized saline infusion, and retrogradely with 75% contrast and 25% heparinized saline infusion. Using rapid exchange technique, this was then advanced and positioned at the origin of the occluded right vertebral artery with the proximal marker proximal to the origin of the right vertebral artery. Control inflation was then performed using micro inflation syringe device via micro tubing to approximately 8 atmospheres where it was maintained for approximately 15 seconds. The balloon was deflated and retrieved and removed. Control arteriogram performed through [facility] guide catheter demonstrated significantly improved caliber and flow through the angioplastied segment with copious flow noted intracranially. 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 Patency was noted of the posterior cerebral arteries, the superior cerebellar arteries and faintly the anterior-inferior cerebellar arteries bilaterally. Significantly improved caliber was also noted at the origin of the right vertebral artery. A 120 cm Phenom Plus catheter was then advanced over the exchange micro guidewire under fluoroscopic guidance and positioned in the distal right vertebral artery. The Benchmark catheter was now advanced without difficulty into the distal right vertebral artery also. A roadmap was then obtained. Using a torque device, the exchange micro guidewire  was safely advanced under fluoroscopic guidance into the proximal basilar artery and then the distal basilar artery into the right posterior cerebral artery P1 segment. Measurements were then performed of the basilar artery distal and proximal to the prominent stenosis. It was decided to proceed with placement of a Resolute Onyx 3 mm x 15 mm balloon mounted stent intracranially. However, the stented stent delivery apparatus could not be advanced through the 045 Phenom Plus catheter. The Phenom Plus catheter was removed with the wire maintained in the right P1 segment. The Benchmark guide catheter was easily then advanced into the right vertebrobasilar junction just proximal to the basilar artery. The Resolute Onyx which had been prepped with heparinized saline infusion retrogradely and angiographically with 60% contrast and 40% heparinized saline infusion was reintroduced and advanced without difficulty over the exchange wire to the distal end of the Benchmark guide catheter. This stent apparatus was easily advanced and positioned with the proximal and the distal markers adequate distant from the site of the significant mid basilar artery stenosis. The stent was then deployed by inflating balloon using a micro inflation syringe device via micro tubing. Balloon was expanded to approximately 2.9 mm. The balloon was then deflated and retrieved. A control arteriogram performed through the Benchmark guide catheter demonstrated excellent apposition and flow through the stented mid basilar artery. Patency was seen of the posterior cerebral arteries, the superior cerebellar arteries and faintly the anterior-inferior cerebellar arteries. The Benchmark catheter was then retrieved more proximally while the exchange wire was retrieved into the V4 segment of the right vertebral artery. The Benchmark catheter was retrieved to just proximal to the origin of the right vertebral artery. Measurements were again performed of the  vertebral artery at this  site. It was decided to proceed with placement of a 3.5 mm x 18 mm Onyx Frontier balloon mountable stent. After having been prepped in the usual manner, this was then advanced and positioned such that the proximal portion of the device was just proximal to the origin of the right vertebral artery. Thereafter, controlled inflation was performed of the balloon mountable stent to approximately 8 atmospheres. This was maintained for approximately 10 seconds. The balloon was then deflated and retrieved and removed. A control arteriogram performed through the Benchmark guide catheter demonstrated excellent apposition and coverage of the previously occluded right vertebral artery origin and proximal portion. Free flow was now noted through the proximal stent into the intracranial portion with patency maintained of the positioned mid basilar artery stent. Control arteriograms were then performed at 15 and 25 minutes post deployment of the devices. These continued to demonstrate excellent apposition with flow extra cranially and intracranially. The patient at this time was also given approximately 4 mg of intra-arterial Integrilin in order to prevent formation of platelet aggregation at the stented site. None was obvious on diagnostic catheter arteriograms. The exchange guidewire was then retrieved and removed. A final control arteriogram performed through the Benchmark catheter and just proximal to the right vertebral artery origin continued to demonstrate excellent flow through the proximal stent and extra cranially and intracranially. There continued to be excellent flow through the basilar artery into the posterior cerebral arteries, the superior cerebellar arteries with faint opacification of both anterior-inferior cerebellar arteries. The Benchmark guide sheath was removed. The radial sheath was then removed with successful hemostasis with a wrist band at the right radial puncture site. Distal  right radial pulse was verified to be present. A CT of the brain performed on the table demonstrated no evidence of hemorrhage or hydrocephalus. The patient was then extubated. Upon recovery, the patient was able to converse appropriately. He denied any nausea, vomiting or headaches. He was able to move all 4 extremities spontaneously and to command. His pupils were 2 mm equal and sluggish. No change was seen in the right facial droop. The tongue remained in the midline. The patient was then returned to his room in the ICU in stable condition to continue on low-dose IV heparin and 81 aspirin daily, and Brilinta 90 mg b.i.d. Early in the evening, the patient was more awake, alert and appropriately responsive. He continued to have dysarthria with right facial droop. Neurological examination continued to have non sustained nystagmus on right lateral gaze and to a lesser degree left lateral gaze with oscillopsia on the right lateral gaze unchanged. 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 PLAN: Follow-up in clinic 2 weeks post discharge. Electronically Signed   By: Luanne Bras M.D.   On: 09/02/2021 05:39   IR US Guide Vasc Access Right  Result Date: 09/02/2021 CLINICAL DATA:  History of progressive onset of dysarthria, nausea, vomiting and left-sided numbness. Patient also developed diplopia. 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 EXAM: INTRACRANIAL STENT (INCL PTA) COMPARISON:  CT angiogram of the head and neck of August 29, 2021, and MRI of the brain of August 29, 2021. MEDICATIONS: Heparin 4,000 units IV.  Ancef 2 g IV antibiotic  was administered within 1 hour of the procedure. ANESTHESIA/SEDATION: Mac anesthesia as per Department of Anesthesiology for diagnostic portion followed by general anesthesia for endovascular treatment portion. CONTRAST:  Omnipaque 300 approximately 150 mL. FLUOROSCOPY TIME:  Fluoroscopy Time: 41 minutes 18 seconds (2310 mGy). COMPLICATIONS: None immediate. TECHNIQUE: Informed written consent was obtained from the patient after a thorough discussion of the procedural risks, benefits and alternatives. All questions were addressed. Maximal Sterile Barrier Technique was utilized including caps, mask, sterile gowns, sterile gloves, sterile drape, hand hygiene and skin antiseptic. A timeout was performed prior to the initiation of the procedure. The right forearm to the wrist was prepped and draped in the usual sterile manner. The right radial artery was then identified with ultrasound and its morphology documented. A dorsal palmar anastomosis was verified to be present. Using ultrasound guidance, and a micropuncture set access into the right radial artery was obtained with a 6/7 French radial sheath. The obturator, and the guidewire were removed. Good aspiration was obtained from the side port of the sheath. Cocktail of 2000 units of heparin, 2.5 mg of verapamil, and 200 mcg of nitroglycerin was then infused through the sheath in diluted form without event. A right radial arteriogram was performed. Over a 0.035 inch Roadrunner guidewire, a 5 [place] Simmons 2 diagnostic catheter was advanced to the aortic arch region, and arteriograms were performed of the right common carotid artery, the left common carotid artery, the left vertebral artery, and the right subclavian artery at the origin of stump of the occluded right vertebral artery. FINDINGS: The diagnostic study was somewhat marred by persistent motion artifact. The left common carotid arteriogram demonstrates the left external carotid artery  and its major branches to be widely patent. The left internal carotid artery at the bulb demonstrates approximately 50% stenosis without evidence of intraluminal filling defects. More distally, the left internal carotid artery is seen to opacify to the cranial skull base. 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 A left posterior communicating artery is seen opacifying the left posterior cerebral artery distribution. The left middle cerebral artery and the left anterior cerebral artery opacify into the capillary and venous phases. Non dominant left vertebral artery demonstrates patency at its origin with brisk ascent to the cranial skull base to supply primarily the ipsilateral left posteroinferior cerebellar artery. The right common carotid arteriogram demonstrates the right external carotid artery and its major branches to be widely patent. The right internal carotid artery at the bulb has approximately 90% stenosis due to a circumferential smooth plaque. More distally, the vessel is seen to opacify to the cranial skull base. The petrous, the cavernous and the supraclinoid segments are widely patent. A right posterior communicating artery is seen opacifying the right posterior cerebral artery distribution. The right middle and the right anterior cerebral arteries opacify into the capillary and venous phases. The right subclavian arteriogram at the origin of the occluded right vertebral artery demonstrates partial reconstitution of the right vertebral artery at the level of C3-C4 from collaterals arising from the cervical branch of the thyrocervical trunk with opacification noted more distally into the dominant right vertebrobasilar junction. Opacification is noted into the basilar artery, and retrogradely in the right vertebral artery to just above the site of the occlusion in the proximal right  vertebral artery. ENDOVASCULAR REVASCULARIZATION OF OCCLUDED DOMINANT PROXIMAL RIGHT VERTEBRAL ARTERY WITH STENT ASSISTED ANGIOPLASTY, AND OF THE MID BASILAR ARTERY WITH STENT  ASSISTED ANGIOPLASTY Over the exchange 035 inch 300 cm Rosen exchange guidewire, the combination of a 95 cm Benchmark guide catheter with a support 5.5 [place] Berenstein catheter was advanced and positioned just proximal to the stump of the occluded dominant right vertebral artery. The guidewire and the support catheter were gently retrieved and removed. Good aspiration was obtained from the hub of the Benchmark guide catheter. Bilateral roadmap was then obtained centered over the vertebral artery origin and distally through the [facility] guide catheter. Over an 0.014 inch standard Synchro micro guidewire with a moderate J configuration, inside of an 021 160 cm Trevo Trak microcatheter combination was advanced to just proximal to the occluded dominant right vertebral artery. Using a torque device with gentle manipulation access was obtained with the micro guidewire through the occluded right vertebral artery without difficulty. The guidewire was advanced to the vertebral artery followed by the microcatheter. The guidewire was removed. Good aspiration obtained from the hub of microcatheter. A control arteriogram was then performed through the microcatheter distally with opacification noted in the right vertebrobasilar junction, the basilar artery, the right posterior cerebral artery, the superior cerebellar arteries and the anterior-inferior cerebellar arteries. Also noted was significant narrowing secondary to an eccentric arteriosclerotic plaque in the mid basilar artery just proximal to the origin of right anterior inferior cerebellar artery. The microcatheter was then exchanged under constant fluoroscopic guidance for an 014 inch 300 cm Synchro standard micro guidewire with a moderate J configuration at its tip. Measurements were then  performed of the most proximal portion of the right vertebral artery from the previous diagnostic arteriogram. It was decided to proceed with initial balloon angioplasty of the origin of the occluded right vertebral artery with a 2.5 mm x 15 mm Apex Monorail angioplasty balloon. This was prepped and purged antegradely with heparinized saline infusion, and retrogradely with 75% contrast and 25% heparinized saline infusion. Using rapid exchange technique, this was then advanced and positioned at the origin of the occluded right vertebral artery with the proximal marker proximal to the origin of the right vertebral artery. Control inflation was then performed using micro inflation syringe device via micro tubing to approximately 8 atmospheres where it was maintained for approximately 15 seconds. The balloon was deflated and retrieved and removed. Control arteriogram performed through [facility] guide catheter demonstrated significantly improved caliber and flow through the angioplastied segment with copious flow noted intracranially. 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 Patency was noted of the posterior cerebral arteries, the superior cerebellar arteries and faintly the anterior-inferior cerebellar arteries bilaterally. Significantly improved caliber was also noted at the origin of the right vertebral artery. A 120 cm Phenom Plus catheter was then advanced over the exchange micro guidewire under fluoroscopic guidance and positioned in the distal right vertebral artery. The Benchmark catheter was now advanced without difficulty into the distal right vertebral artery also. A roadmap was then obtained. Using a torque device, the exchange micro guidewire was safely advanced under fluoroscopic guidance into the proximal basilar artery and then the distal basilar artery into the right posterior cerebral artery P1 segment. Measurements were then  performed of the basilar artery distal and proximal to the prominent stenosis. It was decided to proceed with placement of a Resolute Onyx 3 mm x 15 mm balloon mounted stent intracranially. However, the stented stent delivery apparatus could not be advanced through the 045 Phenom Plus catheter. The Phenom Plus catheter was removed with the wire maintained in  the right P1 segment. The Benchmark guide catheter was easily then advanced into the right vertebrobasilar junction just proximal to the basilar artery. The Resolute Onyx which had been prepped with heparinized saline infusion retrogradely and angiographically with 60% contrast and 40% heparinized saline infusion was reintroduced and advanced without difficulty over the exchange wire to the distal end of the Benchmark guide catheter. This stent apparatus was easily advanced and positioned with the proximal and the distal markers adequate distant from the site of the significant mid basilar artery stenosis. The stent was then deployed by inflating balloon using a micro inflation syringe device via micro tubing. Balloon was expanded to approximately 2.9 mm. The balloon was then deflated and retrieved. A control arteriogram performed through the Benchmark guide catheter demonstrated excellent apposition and flow through the stented mid basilar artery. Patency was seen of the posterior cerebral arteries, the superior cerebellar arteries and faintly the anterior-inferior cerebellar arteries. The Benchmark catheter was then retrieved more proximally while the exchange wire was retrieved into the V4 segment of the right vertebral artery. The Benchmark catheter was retrieved to just proximal to the origin of the right vertebral artery. Measurements were again performed of the vertebral artery at this site. It was decided to proceed with placement of a 3.5 mm x 18 mm Onyx Frontier balloon mountable stent. After having been prepped in the usual manner, this was then  advanced and positioned such that the proximal portion of the device was just proximal to the origin of the right vertebral artery. Thereafter, controlled inflation was performed of the balloon mountable stent to approximately 8 atmospheres. This was maintained for approximately 10 seconds. The balloon was then deflated and retrieved and removed. A control arteriogram performed through the Benchmark guide catheter demonstrated excellent apposition and coverage of the previously occluded right vertebral artery origin and proximal portion. Free flow was now noted through the proximal stent into the intracranial portion with patency maintained of the positioned mid basilar artery stent. Control arteriograms were then performed at 15 and 25 minutes post deployment of the devices. These continued to demonstrate excellent apposition with flow extra cranially and intracranially. The patient at this time was also given approximately 4 mg of intra-arterial Integrilin in order to prevent formation of platelet aggregation at the stented site. None was obvious on diagnostic catheter arteriograms. The exchange guidewire was then retrieved and removed. A final control arteriogram performed through the Benchmark catheter and just proximal to the right vertebral artery origin continued to demonstrate excellent flow through the proximal stent and extra cranially and intracranially. There continued to be excellent flow through the basilar artery into the posterior cerebral arteries, the superior cerebellar arteries with faint opacification of both anterior-inferior cerebellar arteries. The Benchmark guide sheath was removed. The radial sheath was then removed with successful hemostasis with a wrist band at the right radial puncture site. Distal right radial pulse was verified to be present. A CT of the brain performed on the table demonstrated no evidence of hemorrhage or hydrocephalus. The patient was then extubated. Upon recovery,  the patient was able to converse appropriately. He denied any nausea, vomiting or headaches. He was able to move all 4 extremities spontaneously and to command. His pupils were 2 mm equal and sluggish. No change was seen in the right facial droop. The tongue remained in the midline. The patient was then returned to his room in the ICU in stable condition to continue on low-dose IV heparin and 81 aspirin daily, and  Brilinta 90 mg b.i.d. Early in the evening, the patient was more awake, alert and appropriately responsive. He continued to have dysarthria with right facial droop. Neurological examination continued to have non sustained nystagmus on right lateral gaze and to a lesser degree left lateral gaze with oscillopsia on the right lateral gaze unchanged. 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 PLAN: Follow-up in clinic 2 weeks post discharge. Electronically Signed   By: Luanne Bras M.D.   On: 09/02/2021 05:39   CT CEREBRAL PERFUSION W CONTRAST  Result Date: 08/29/2021 CLINICAL DATA:  Neuro deficit, acute, stroke suspected EXAM: CT ANGIOGRAPHY HEAD AND NECK CT PERFUSION BRAIN TECHNIQUE: Multidetector CT imaging of the head and neck was performed using the standard protocol during bolus administration of intravenous contrast. Multiplanar CT image reconstructions and MIPs were obtained to evaluate the vascular anatomy. Carotid stenosis measurements (when applicable) are obtained utilizing NASCET criteria, using the distal internal carotid diameter as the denominator. Multiphase CT imaging of the brain was performed following IV bolus contrast injection. Subsequent parametric perfusion maps were calculated using RAPID software. CONTRAST:  100 mL Omnipaque 350 COMPARISON:  None.  FINDINGS: CTA NECK Aortic arch: Great vessel origins are patent. Right carotid system: Patent. Noncalcified plaque at the ICA origin causes 65% stenosis. Left carotid system: Patent. Mixed plaque at the ICA origin causes less than 50% stenosis. Vertebral arteries: Left vertebral artery is patent. Absent opacification of the proximal right vertebral artery at the origin with minimal reconstitution of the V1 segment followed by reocclusion. The V2 segment is occluded proximally with reconstitution at the C4 level. Skeleton: Mild cervical spine degenerative changes, greatest at C6-C7. Other neck: Unremarkable. Upper chest: No apical lung mass. Review of the MIP images confirms the above findings CTA HEAD Anterior circulation: Intracranial internal carotid arteries are patent with mild calcified plaque. Anterior and middle cerebral arteries are patent. Posterior circulation: There is decreased caliber of the intracranial left vertebral artery throughout its course. Appears to terminate at patent left PICA. Intracranial right vertebral artery is patent. Basilar is patent but there is low density within the lumen proximally. Superior cerebellar artery origins are patent. Bilateral posterior communicating arteries are present. Posterior cerebral arteries are patent. Venous sinuses: As permitted by contrast timing, patent. Review of the MIP images confirms the above findings CT Brain Perfusion Findings: CBF (<30%) Volume: 581m Perfusion (Tmax>6.0s) volume: 048mMismatch Volume: 81m21mnfarction Location: None. IMPRESSION: Abnormal extracranial right vertebral artery with limited opacification proximally and reconstitution at the C4 level. May reflect high-grade origin stenosis. Right PICA or like AICA origins are not identified. Basilar artery is patent but there is question of nonocclusive clot proximally. Noncalcified plaque at the right ICA origin causes 65% stenosis. Mixed plaque at the left ICA origin causes less than 50%  stenosis. Diffuse mild narrowing of the intracranial left vertebral artery, which terminates as a PICA. Perfusion imaging demonstrates no evidence of core infarction or penumbra, but there is limited evaluation of the posterior fossa. Initial results were provided by telephone at the time of interpretation on 08/29/2021 at 10:20 am to provider [facility]who verbally acknowledged these results. Electronically Signed   By: PraMacy MisD.   On: 08/29/2021 10:44   ECHOCARDIOGRAM COMPLETE  Result Date: 08/30/2021    ECHOCARDIOGRAM REPORT   Patient Name:  [name] Date of Exam: 08/30/2021 Medical Rec #:  [id number]  Height:       72.0 in Accession #:    [id number] Weight:       202.8 lb Date of Birth:  1956/05/08   BSA:          2.143 m Patient Age:    4 years   BP:           155/93 mmHg Patient Gender: M          HR:           76 bpm. Exam Location:  Inpatient Procedure: 2D Echo, Color Doppler and Cardiac Doppler Indications:    Eval for PFO  History:        Patient has no prior history of Echocardiogram examinations.  Sonographer:    Jyl Heinz Referring Phys: [id number] [place]  1. Left ventricular ejection fraction, by estimation, is 60 to 65%. The left ventricle has normal function. The left ventricle has no regional wall motion abnormalities. Left ventricular diastolic parameters are consistent with Grade II diastolic dysfunction (pseudonormalization).  2. Right ventricular systolic function is normal. The right ventricular size is normal. Tricuspid regurgitation signal is inadequate for assessing PA pressure.  3. The mitral valve is normal in structure. No evidence of mitral valve regurgitation. No evidence of mitral stenosis.  4. The aortic valve is tricuspid. Aortic valve regurgitation is not visualized. Mild aortic valve sclerosis is present, with no evidence of aortic valve stenosis.  5. The inferior vena cava is normal in size with greater than 50% respiratory variability,  suggesting right atrial pressure of 3 mmHg. FINDINGS  Left Ventricle: Left ventricular ejection fraction, by estimation, is 60 to 65%. The left ventricle has normal function. The left ventricle has no regional wall motion abnormalities. The left ventricular internal cavity size was normal in size. There is  no left ventricular hypertrophy. Left ventricular diastolic parameters are consistent with Grade II diastolic dysfunction (pseudonormalization). Right Ventricle: The right ventricular size is normal. No increase in right ventricular wall thickness. Right ventricular systolic function is normal. Tricuspid regurgitation signal is inadequate for assessing PA pressure. Left Atrium: Left atrial size was normal in size. Right Atrium: Right atrial size was normal in size. Pericardium: There is no evidence of pericardial effusion. Mitral Valve: The mitral valve is normal in structure. No evidence of mitral valve regurgitation. No evidence of mitral valve stenosis. Tricuspid Valve: The tricuspid valve is normal in structure. Tricuspid valve regurgitation is not demonstrated. Aortic Valve: The aortic valve is tricuspid. Aortic valve regurgitation is not visualized. Mild aortic valve sclerosis is present, with no evidence of aortic valve stenosis. Aortic valve peak gradient measures 9.4 mmHg. Pulmonic Valve: The pulmonic valve was normal in structure. Pulmonic valve regurgitation is trivial. Aorta: The aortic root is normal in size and structure. Venous: The inferior vena cava is normal in size with greater than 50% respiratory variability, suggesting right atrial pressure of 3 mmHg. IAS/Shunts: No atrial level shunt detected by color flow Doppler.  LEFT VENTRICLE PLAX 2D LVIDd:         4.40 cm      Diastology LVIDs:         2.80 cm      LV e' medial:    7.07 cm/s LV PW:         1.00 cm      LV E/e' medial:  8.9 LV IVS:        1.00  cm      LV e' lateral:   6.85 cm/s LVOT diam:     2.10 cm      LV E/e' lateral: 9.2 LV SV:          84 LV SV Index:   39 LVOT Area:     3.46 cm  LV Volumes (MOD) LV vol d, MOD A2C: 96.1 ml LV vol d, MOD A4C: 107.0 ml LV vol s, MOD A2C: 43.0 ml LV vol s, MOD A4C: 44.4 ml LV SV MOD A2C:     53.1 ml LV SV MOD A4C:     107.0 ml LV SV MOD BP:      58.1 ml RIGHT VENTRICLE             IVC RV Basal diam:  2.60 cm     IVC diam: 1.70 cm RV Mid diam:    2.50 cm RV S prime:     15.40 cm/s TAPSE (M-mode): 2.5 cm LEFT ATRIUM           Index        RIGHT ATRIUM           Index LA diam:      3.60 cm 1.68 cm/m   RA Area:     14.90 cm LA Vol (A2C): 37.2 ml 17.36 ml/m  RA Volume:   29.30 ml  13.67 ml/m LA Vol (A4C): 50.2 ml 23.42 ml/m  AORTIC VALVE AV Area (Vmax): 2.94 cm AV Vmax:        153.00 cm/s AV Peak Grad:   9.4 mmHg LVOT Vmax:      130.00 cm/s LVOT Vmean:     73.000 cm/s LVOT VTI:       0.243 m  AORTA Ao Root diam: 2.90 cm Ao Asc diam:  3.40 cm MITRAL VALVE MV Area (PHT): 3.31 cm    SHUNTS MV Decel Time: 229 msec    Systemic VTI:  0.24 m MV E velocity: 62.90 cm/s  Systemic Diam: 2.10 cm MV A velocity: 67.00 cm/s MV E/A ratio:  0.94 Dalton McleanMD Electronically signed by Franki Monte Signature Date/Time: 08/30/2021/2:45:21 PM    Final    ECHO TEE  Result Date: 09/05/2021    TRANSESOPHOGEAL ECHO REPORT   Patient Name:   Maurice Little Date of Exam: 08/31/2021 Medical Rec #:  [id number]  Height:       72.0 in Accession #:    [id number] Weight:       202.8 lb Date of Birth:  1956-01-24   BSA:          2.143 m Patient Age:    34 years   BP:           127/74 mmHg Patient Gender: M          HR:           97 bpm. Exam Location:  Inpatient Procedure: Cardiac Doppler, Color Doppler, Transesophageal Echo and Saline            Contrast Bubble Study Indications:     Stroke  History:         Patient has prior history of Echocardiogram examinations, most                  recent 08/30/2021.  Sonographer:     Merrie Roof RDCS Referring Phys:  [id number] RHONDA G BARRETT Diagnosing Phys: Skeet Latch MD PROCEDURE: After discussion  of the risks and benefits of a TEE, an informed  consent was obtained from the patient. The transesophogeal probe was passed without difficulty through the esophogus of the patient. Local oropharyngeal anesthetic was provided with Cetacaine. Sedation performed by different physician. The patient was monitored while under deep sedation. The patient's vital signs; including heart rate, blood pressure, and oxygen saturation; remained stable throughout the procedure. The patient developed no complications during the procedure. IMPRESSIONS  1. Left ventricular ejection fraction, by estimation, is 55 to 60%. The left ventricle has normal function. The left ventricle has no regional wall motion abnormalities.  2. Right ventricular systolic function is normal. The right ventricular size is normal.  3. No left atrial/left atrial appendage thrombus was detected.  4. The mitral valve is normal in structure. Trivial mitral valve regurgitation. No evidence of mitral stenosis.  5. The aortic valve is tricuspid. Aortic valve regurgitation is not visualized. No aortic stenosis is present.  6. 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  7. The inferior vena cava is normal in size with greater than 50% respiratory variability, suggesting right atrial pressure of 3 mmHg.  8. Agitated saline contrast bubble study was positive with shunting observed within 3-6 cardiac cycles suggestive of interatrial shunt. There is a small patent foramen ovale with predominantly right to left shunting across the atrial septum. Conclusion(s)/Recommendation(s): Normal biventricular function without evidence of hemodynamically significant valvular heart disease. FINDINGS  Left Ventricle: Left ventricular ejection fraction, by estimation, is 55 to 60%. The left ventricle has normal function. The left ventricle has no regional wall motion abnormalities. The left ventricular internal cavity size was normal in size. There is   no left ventricular hypertrophy. Right Ventricle: The right ventricular size is normal. No increase in right ventricular wall thickness. Right ventricular systolic function is normal. Left Atrium: Left atrial size was normal in size. No left atrial/left atrial appendage thrombus was detected. Right Atrium: Right atrial size was normal in size. Pericardium: There is no evidence of pericardial effusion. Mitral Valve: The mitral valve is normal in structure. Trivial mitral valve regurgitation. No evidence of mitral valve stenosis. Tricuspid Valve: The tricuspid valve is normal in structure. Tricuspid valve regurgitation is not demonstrated. No evidence of tricuspid stenosis. Aortic Valve: The aortic valve is tricuspid. Aortic valve regurgitation is not visualized. No aortic stenosis is present. Pulmonic Valve: The pulmonic valve was normal in structure. Pulmonic valve regurgitation is trivial. No evidence of pulmonic stenosis. Aorta: The aortic root is normal in size and structure. There is moderate (Grade III) atheroma plaque involving the descending aorta. Venous: The inferior vena cava is normal in size with greater than 50% respiratory variability, suggesting right atrial pressure of 3 mmHg. IAS/Shunts: No atrial level shunt detected by color flow Doppler. Agitated saline contrast was given intravenously to evaluate for intracardiac shunting. Agitated saline contrast bubble study was positive with shunting observed within 3-6 cardiac cycles suggestive of interatrial shunt. A small patent foramen ovale is detected with predominantly right to left shunting across the atrial septum. Skeet Latch MD Electronically signed by Skeet Latch MD Signature Date/Time: 09/05/2021/11:24:08 AM    Final    CT HEAD CODE STROKE WO CONTRAST  Result Date: 08/29/2021 CLINICAL DATA:  Code stroke.  Left-sided weakness EXAM: CT HEAD WITHOUT CONTRAST TECHNIQUE: Contiguous axial images were obtained from the base of the skull  through the vertex without intravenous contrast. COMPARISON:  None. FINDINGS: Brain: There is no acute intracranial hemorrhage, mass effect, or edema. Gray-white differentiation is preserved. There is an age-indeterminate small  infarct of the right inferior cerebellum. Ventricles and sulci are normal in size and configuration. No extra-axial collection. Vascular: No hyperdense vessel. Intracranial atherosclerotic calcification at the skull base. Skull: Unremarkable. Sinuses/Orbits: Lobular mucosal thickening. Orbits are unremarkable. Other: Mastoid air cells are clear. ASPECTS [facility] Stroke Program Early CT Score) - Ganglionic level infarction (caudate, lentiform nuclei, internal capsule, insula, M1-M3 cortex): 7 - Supraganglionic infarction (M4-M6 cortex): 3 Total score (0-10 with 10 being normal): 10 IMPRESSION: There is no acute intracranial hemorrhage. ASPECT score is 10. 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 These results were communicated to Dr. Rory Percy at 9:44 am on 08/29/2021 by text page via the [facility] messaging system. Electronically Signed   By: Macy Mis M.D.   On: 08/29/2021 09:46   VAS [place] LOWER EXTREMITY VENOUS (DVT)  Result Date: 08/31/2021  Lower Venous DVT Study Patient Name:  LARELL BANEY  Date of Exam:   08/31/2021 Medical Rec #: [id number]   Accession #:    [id number] Date of Birth: [birth year]    Patient Gender: M Patient Age:   82 years Exam Location:  [facility] Procedure:      VAS [place] LOWER EXTREMITY VENOUS (DVT) Referring Phys: Cornelius Moras XU --------------------------------------------------------------------------------  Indications: Stroke.  Comparison Study: no prior Performing Technologist: Archie Patten RVS  Examination Guidelines: A complete evaluation includes B-mode imaging, spectral Doppler, color Doppler, and power Doppler as needed of all accessible portions of each vessel. Bilateral testing is considered an integral part of a complete examination.  Limited examinations for reoccurring indications may be performed as noted. The reflux portion of the exam is performed with the patient in reverse Trendelenburg.  +---------+---------------+---------+-----------+----------+--------------+ RIGHT    CompressibilityPhasicitySpontaneityPropertiesThrombus Aging +---------+---------------+---------+-----------+----------+--------------+ CFV      Full           Yes      Yes                                 +---------+---------------+---------+-----------+----------+--------------+ SFJ      Full                                                        +---------+---------------+---------+-----------+----------+--------------+ FV Prox  Full                                                        +---------+---------------+---------+-----------+----------+--------------+ FV Mid   Full                                                        +---------+---------------+---------+-----------+----------+--------------+ FV DistalFull                                                        +---------+---------------+---------+-----------+----------+--------------+ PFV      Full                                                        +---------+---------------+---------+-----------+----------+--------------+  POP      Full           Yes      Yes                                 +---------+---------------+---------+-----------+----------+--------------+ PTV      Full                                                        +---------+---------------+---------+-----------+----------+--------------+ PERO     Full                                                        +---------+---------------+---------+-----------+----------+--------------+   +---------+---------------+---------+-----------+----------+--------------+ LEFT     CompressibilityPhasicitySpontaneityPropertiesThrombus Aging  +---------+---------------+---------+-----------+----------+--------------+ CFV      Full           Yes      Yes                                 +---------+---------------+---------+-----------+----------+--------------+ SFJ      Full                                                        +---------+---------------+---------+-----------+----------+--------------+ FV Prox  Full                                                        +---------+---------------+---------+-----------+----------+--------------+ FV Mid   Full                                                        +---------+---------------+---------+-----------+----------+--------------+ FV DistalFull                                                        +---------+---------------+---------+-----------+----------+--------------+ PFV      Full                                                        +---------+---------------+---------+-----------+----------+--------------+ POP      Full           Yes      Yes                                 +---------+---------------+---------+-----------+----------+--------------+  PTV      Full                                                        +---------+---------------+---------+-----------+----------+--------------+ PERO     Full                                                        +---------+---------------+---------+-----------+----------+--------------+     Summary: BILATERAL: - No evidence of deep vein thrombosis seen in the lower extremities, bilaterally. - No evidence of superficial venous thrombosis in the lower extremities, bilaterally. -   *See table(s) above for measurements and observations. Electronically signed by Monica Martinez MD on 08/31/2021 at 12:34:17 PM.    Final    CT ANGIO HEAD NECK W WO CM (CODE STROKE)  Result Date: 08/29/2021 CLINICAL DATA:  Neuro deficit, acute, stroke suspected EXAM: CT ANGIOGRAPHY HEAD AND NECK CT  PERFUSION BRAIN TECHNIQUE: Multidetector CT imaging of the head and neck was performed using the standard protocol during bolus administration of intravenous contrast. Multiplanar CT image reconstructions and MIPs were obtained to evaluate the vascular anatomy. Carotid stenosis measurements (when applicable) are obtained utilizing NASCET criteria, using the distal internal carotid diameter as the denominator. Multiphase CT imaging of the brain was performed following IV bolus contrast injection. Subsequent parametric perfusion maps were calculated using RAPID software. CONTRAST:  100 mL Omnipaque 350 COMPARISON:  None. FINDINGS: CTA NECK Aortic arch: Great vessel origins are patent. Right carotid system: Patent. Noncalcified plaque at the ICA origin causes 65% stenosis. Left carotid system: Patent. Mixed plaque at the ICA origin causes less than 50% stenosis. Vertebral arteries: Left vertebral artery is patent. Absent opacification of the proximal right vertebral artery at the origin with minimal reconstitution of the V1 segment followed by reocclusion. The V2 segment is occluded proximally with reconstitution at the C4 level. Skeleton: Mild cervical spine degenerative changes, greatest at C6-C7. Other neck: Unremarkable. Upper chest: No apical lung mass. Review of the MIP images confirms the above findings CTA HEAD Anterior circulation: Intracranial internal carotid arteries are patent with mild calcified plaque. Anterior and middle cerebral arteries are patent. Posterior circulation: There is decreased caliber of the intracranial left vertebral artery throughout its course. Appears to terminate at patent left PICA. Intracranial right vertebral artery is patent. Basilar is patent but there is low density within the lumen proximally. Superior cerebellar artery origins are patent. Bilateral posterior communicating arteries are present. Posterior cerebral arteries are patent. Venous sinuses: As permitted by contrast  timing, patent. Review of the MIP images confirms the above findings CT Brain Perfusion Findings: CBF (<30%) Volume: 82m Perfusion (Tmax>6.0s) volume: 051mMismatch Volume: 57m39mnfarction Location: None. IMPRESSION: Abnormal extracranial right vertebral artery with limited opacification proximally and reconstitution at the C4 level. May reflect high-grade origin stenosis. Right PICA or like AICA origins are not identified. Basilar artery is patent but there is question of nonocclusive clot proximally. Noncalcified plaque at the right ICA origin causes 65% stenosis. Mixed plaque at the left ICA origin causes less than 50% stenosis. Diffuse mild narrowing of the intracranial left vertebral artery, which terminates as a PICA. Perfusion imaging demonstrates no  evidence of core infarction or penumbra, but there is limited evaluation of the posterior fossa. Initial results were provided by telephone at the time of interpretation on 08/29/2021 at 10:20 am to provider [facility] , who verbally acknowledged these results. Electronically Signed   By: Macy Mis M.D.   On: 08/29/2021 10:44   IR ANGIO VERTEBRAL SEL SUBCLAVIAN INNOMINATE UNI L MOD SED  Result Date: 09/02/2021 CLINICAL DATA:  History of progressive onset of dysarthria, nausea, vomiting and left-sided numbness. Patient also developed diplopia. 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 EXAM: INTRACRANIAL STENT (INCL PTA) COMPARISON:  CT angiogram of the head and neck of August 29, 2021, and MRI of the brain of August 29, 2021. MEDICATIONS: Heparin 4,000 units IV. Ancef 2 g IV antibiotic was administered within 1 hour of the procedure. ANESTHESIA/SEDATION: Mac anesthesia as per Department of Anesthesiology for diagnostic portion followed by general anesthesia for endovascular  treatment portion. CONTRAST:  Omnipaque 300 approximately 150 mL. FLUOROSCOPY TIME:  Fluoroscopy Time: 41 minutes 18 seconds (2310 mGy). COMPLICATIONS: None immediate. TECHNIQUE: Informed written consent was obtained from the patient after a thorough discussion of the procedural risks, benefits and alternatives. All questions were addressed. Maximal Sterile Barrier Technique was utilized including caps, mask, sterile gowns, sterile gloves, sterile drape, hand hygiene and skin antiseptic. A timeout was performed prior to the initiation of the procedure. The right forearm to the wrist was prepped and draped in the usual sterile manner. The right radial artery was then identified with ultrasound and its morphology documented. A dorsal palmar anastomosis was verified to be present. Using ultrasound guidance, and a micropuncture set access into the right radial artery was obtained with a 6/7 French radial sheath. The obturator, and the guidewire were removed. Good aspiration was obtained from the side port of the sheath. Cocktail of 2000 units of heparin, 2.5 mg of verapamil, and 200 mcg of nitroglycerin was then infused through the sheath in diluted form without event. A right radial arteriogram was performed. Over a 0.035 inch Roadrunner guidewire, a 5 [place] Simmons 2 diagnostic catheter was advanced to the aortic arch region, and arteriograms were performed of the right common carotid artery, the left common carotid artery, the left vertebral artery, and the right subclavian artery at the origin of stump of the occluded right vertebral artery. FINDINGS: The diagnostic study was somewhat marred by persistent motion artifact. The left common carotid arteriogram demonstrates the left external carotid artery and its major branches to be widely patent. The left internal carotid artery at the bulb demonstrates approximately 50% stenosis without evidence of intraluminal filling defects. More distally, the left internal  carotid artery is seen to opacify to the cranial skull base. 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 A left posterior communicating artery is seen opacifying the left posterior cerebral artery distribution. The left middle cerebral artery and the left anterior cerebral artery opacify into the capillary and venous phases. Non dominant left vertebral artery demonstrates patency at its origin with brisk ascent to the cranial skull base to supply primarily the ipsilateral left posteroinferior cerebellar artery. The right common carotid arteriogram demonstrates the right external carotid artery and its major branches to be widely patent. The right internal carotid artery at the bulb has approximately 90% stenosis due  to a circumferential smooth plaque. More distally, the vessel is seen to opacify to the cranial skull base. The petrous, the cavernous and the supraclinoid segments are widely patent. A right posterior communicating artery is seen opacifying the right posterior cerebral artery distribution. The right middle and the right anterior cerebral arteries opacify into the capillary and venous phases. The right subclavian arteriogram at the origin of the occluded right vertebral artery demonstrates partial reconstitution of the right vertebral artery at the level of C3-C4 from collaterals arising from the cervical branch of the thyrocervical trunk with opacification noted more distally into the dominant right vertebrobasilar junction. Opacification is noted into the basilar artery, and retrogradely in the right vertebral artery to just above the site of the occlusion in the proximal right vertebral artery. ENDOVASCULAR REVASCULARIZATION OF OCCLUDED DOMINANT PROXIMAL RIGHT VERTEBRAL ARTERY WITH STENT ASSISTED ANGIOPLASTY, AND OF THE MID BASILAR ARTERY WITH STENT ASSISTED ANGIOPLASTY Over the exchange  035 inch 300 cm Rosen exchange guidewire, the combination of a 95 cm Benchmark guide catheter with a support 5.5 [place] Berenstein catheter was advanced and positioned just proximal to the stump of the occluded dominant right vertebral artery. The guidewire and the support catheter were gently retrieved and removed. Good aspiration was obtained from the hub of the Benchmark guide catheter. Bilateral roadmap was then obtained centered over the vertebral artery origin and distally through the [facility] guide catheter. Over an 0.014 inch standard Synchro micro guidewire with a moderate J configuration, inside of an 021 160 cm Trevo Trak microcatheter combination was advanced to just proximal to the occluded dominant right vertebral artery. Using a torque device with gentle manipulation access was obtained with the micro guidewire through the occluded right vertebral artery without difficulty. The guidewire was advanced to the vertebral artery followed by the microcatheter. The guidewire was removed. Good aspiration obtained from the hub of microcatheter. A control arteriogram was then performed through the microcatheter distally with opacification noted in the right vertebrobasilar junction, the basilar artery, the right posterior cerebral artery, the superior cerebellar arteries and the anterior-inferior cerebellar arteries. Also noted was significant narrowing secondary to an eccentric arteriosclerotic plaque in the mid basilar artery just proximal to the origin of right anterior inferior cerebellar artery. The microcatheter was then exchanged under constant fluoroscopic guidance for an 014 inch 300 cm Synchro standard micro guidewire with a moderate J configuration at its tip. Measurements were then performed of the most proximal portion of the right vertebral artery from the previous diagnostic arteriogram. It was decided to proceed with initial balloon angioplasty of the origin of the occluded right vertebral  artery with a 2.5 mm x 15 mm Apex Monorail angioplasty balloon. This was prepped and purged antegradely with heparinized saline infusion, and retrogradely with 75% contrast and 25% heparinized saline infusion. Using rapid exchange technique, this was then advanced and positioned at the origin of the occluded right vertebral artery with the proximal marker proximal to the origin of the right vertebral artery. Control inflation was then performed using micro inflation syringe device via micro tubing to approximately 8 atmospheres where it was maintained for approximately 15 seconds. The balloon was deflated and retrieved and removed. Control arteriogram performed through [facility] guide catheter demonstrated significantly improved caliber and flow through the angioplastied segment with copious flow noted intracranially. 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 Patency was noted of the posterior cerebral arteries, the superior cerebellar arteries and faintly the anterior-inferior cerebellar  arteries bilaterally. Significantly improved caliber was also noted at the origin of the right vertebral artery. A 120 cm Phenom Plus catheter was then advanced over the exchange micro guidewire under fluoroscopic guidance and positioned in the distal right vertebral artery. The Benchmark catheter was now advanced without difficulty into the distal right vertebral artery also. A roadmap was then obtained. Using a torque device, the exchange micro guidewire was safely advanced under fluoroscopic guidance into the proximal basilar artery and then the distal basilar artery into the right posterior cerebral artery P1 segment. Measurements were then performed of the basilar artery distal and proximal to the prominent stenosis. It was decided to proceed with placement of a Resolute Onyx 3 mm x 15 mm balloon mounted stent intracranially. However, the stented stent  delivery apparatus could not be advanced through the 045 Phenom Plus catheter. The Phenom Plus catheter was removed with the wire maintained in the right P1 segment. The Benchmark guide catheter was easily then advanced into the right vertebrobasilar junction just proximal to the basilar artery. The Resolute Onyx which had been prepped with heparinized saline infusion retrogradely and angiographically with 60% contrast and 40% heparinized saline infusion was reintroduced and advanced without difficulty over the exchange wire to the distal end of the Benchmark guide catheter. This stent apparatus was easily advanced and positioned with the proximal and the distal markers adequate distant from the site of the significant mid basilar artery stenosis. The stent was then deployed by inflating balloon using a micro inflation syringe device via micro tubing. Balloon was expanded to approximately 2.9 mm. The balloon was then deflated and retrieved. A control arteriogram performed through the Benchmark guide catheter demonstrated excellent apposition and flow through the stented mid basilar artery. Patency was seen of the posterior cerebral arteries, the superior cerebellar arteries and faintly the anterior-inferior cerebellar arteries. The Benchmark catheter was then retrieved more proximally while the exchange wire was retrieved into the V4 segment of the right vertebral artery. The Benchmark catheter was retrieved to just proximal to the origin of the right vertebral artery. Measurements were again performed of the vertebral artery at this site. It was decided to proceed with placement of a 3.5 mm x 18 mm Onyx Frontier balloon mountable stent. After having been prepped in the usual manner, this was then advanced and positioned such that the proximal portion of the device was just proximal to the origin of the right vertebral artery. Thereafter, controlled inflation was performed of the balloon mountable stent to  approximately 8 atmospheres. This was maintained for approximately 10 seconds. The balloon was then deflated and retrieved and removed. A control arteriogram performed through the Benchmark guide catheter demonstrated excellent apposition and coverage of the previously occluded right vertebral artery origin and proximal portion. Free flow was now noted through the proximal stent into the intracranial portion with patency maintained of the positioned mid basilar artery stent. Control arteriograms were then performed at 15 and 25 minutes post deployment of the devices. These continued to demonstrate excellent apposition with flow extra cranially and intracranially. The patient at this time was also given approximately 4 mg of intra-arterial Integrilin in order to prevent formation of platelet aggregation at the stented site. None was obvious on diagnostic catheter arteriograms. The exchange guidewire was then retrieved and removed. A final control arteriogram performed through the Benchmark catheter and just proximal to the right vertebral artery origin continued to demonstrate excellent flow through the proximal stent and extra cranially and intracranially. There continued  to be excellent flow through the basilar artery into the posterior cerebral arteries, the superior cerebellar arteries with faint opacification of both anterior-inferior cerebellar arteries. The Benchmark guide sheath was removed. The radial sheath was then removed with successful hemostasis with a wrist band at the right radial puncture site. Distal right radial pulse was verified to be present. A CT of the brain performed on the table demonstrated no evidence of hemorrhage or hydrocephalus. The patient was then extubated. Upon recovery, the patient was able to converse appropriately. He denied any nausea, vomiting or headaches. He was able to move all 4 extremities spontaneously and to command. His pupils were 2 mm equal and sluggish. No change  was seen in the right facial droop. The tongue remained in the midline. The patient was then returned to his room in the ICU in stable condition to continue on low-dose IV heparin and 81 aspirin daily, and Brilinta 90 mg b.i.d. Early in the evening, the patient was more awake, alert and appropriately responsive. He continued to have dysarthria with right facial droop. Neurological examination continued to have non sustained nystagmus on right lateral gaze and to a lesser degree left lateral gaze with oscillopsia on the right lateral gaze unchanged. 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 PLAN: Follow-up in clinic 2 weeks post discharge. Electronically Signed   By: Luanne Bras M.D.   On: 09/02/2021 05:39   IR ANGIO VERTEBRAL SEL SUBCLAVIAN INNOMINATE UNI R MOD SED  Result Date: 09/02/2021 CLINICAL DATA:  History of progressive onset of dysarthria, nausea, vomiting and left-sided numbness. Patient also developed diplopia. 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 EXAM: INTRACRANIAL STENT (INCL PTA) COMPARISON:  CT angiogram of the head and neck of August 29, 2021, and MRI of the brain of August 29, 2021. MEDICATIONS: Heparin 4,000 units IV. Ancef 2 g IV antibiotic was administered within 1 hour of the procedure. ANESTHESIA/SEDATION: Mac anesthesia as per Department of Anesthesiology for diagnostic portion followed by general anesthesia for endovascular treatment portion. CONTRAST:  Omnipaque 300 approximately 150 mL. FLUOROSCOPY TIME:  Fluoroscopy Time: 41 minutes 18 seconds (2310 mGy). COMPLICATIONS: None  immediate. TECHNIQUE: Informed written consent was obtained from the patient after a thorough discussion of the procedural risks, benefits and alternatives. All questions were addressed. Maximal Sterile Barrier Technique was utilized including caps, mask, sterile gowns, sterile gloves, sterile drape, hand hygiene and skin antiseptic. A timeout was performed prior to the initiation of the procedure. The right forearm to the wrist was prepped and draped in the usual sterile manner. The right radial artery was then identified with ultrasound and its morphology documented. A dorsal palmar anastomosis was verified to be present. Using ultrasound guidance, and a micropuncture set access into the right radial artery was obtained with a 6/7 French radial sheath. The obturator, and the guidewire were removed. Good aspiration was obtained from the side port of the sheath. Cocktail of 2000 units of heparin, 2.5 mg of verapamil, and 200 mcg of nitroglycerin was then infused through the sheath in diluted form without event. A right radial arteriogram was performed. Over a 0.035 inch Roadrunner guidewire, a 5 [place] Simmons 2 diagnostic catheter was advanced to the aortic  arch region, and arteriograms were performed of the right common carotid artery, the left common carotid artery, the left vertebral artery, and the right subclavian artery at the origin of stump of the occluded right vertebral artery. FINDINGS: The diagnostic study was somewhat marred by persistent motion artifact. The left common carotid arteriogram demonstrates the left external carotid artery and its major branches to be widely patent. The left internal carotid artery at the bulb demonstrates approximately 50% stenosis without evidence of intraluminal filling defects. More distally, the left internal carotid artery is seen to opacify to the cranial skull base. The petrous, the cavernous and the supraclinoid segments are widely patent with mild intracranial  arteriosclerotic disease of the proximal cavernous segment. A left posterior communicating artery is seen opacifying the left posterior cerebral artery distribution. The left middle cerebral artery and the left anterior cerebral artery opacify into the capillary and venous phases. Non dominant left vertebral artery demonstrates patency at its origin with brisk ascent to the cranial skull base to supply primarily the ipsilateral left posteroinferior cerebellar artery. The right common carotid arteriogram demonstrates the right external carotid artery and its major branches to be widely patent. The right internal carotid artery at the bulb has approximately 90% stenosis due to a circumferential smooth plaque. More distally, the vessel is seen to opacify to the cranial skull base. The petrous, the cavernous and the supraclinoid segments are widely patent. A right posterior communicating artery is seen opacifying the right posterior cerebral artery distribution. The right middle and the right anterior cerebral arteries opacify into the capillary and venous phases. The right subclavian arteriogram at the origin of the occluded right vertebral artery demonstrates partial reconstitution of the right vertebral artery at the level of C3-C4 from collaterals arising from the cervical branch of the thyrocervical trunk with opacification noted more distally into the dominant right vertebrobasilar junction. Opacification is noted into the basilar artery, and retrogradely in the right vertebral artery to just above the site of the occlusion in the proximal right vertebral artery. ENDOVASCULAR REVASCULARIZATION OF OCCLUDED DOMINANT PROXIMAL RIGHT VERTEBRAL ARTERY WITH STENT ASSISTED ANGIOPLASTY, AND OF THE MID BASILAR ARTERY WITH STENT ASSISTED ANGIOPLASTY Over the exchange 035 inch 300 cm Rosen exchange guidewire, the combination of a 95 cm Benchmark guide catheter with a support 5.5 [place] Berenstein catheter was advanced and  positioned just proximal to the stump of the occluded dominant right vertebral artery. The guidewire and the support catheter were gently retrieved and removed. Good aspiration was obtained from the hub of the Benchmark guide catheter. Bilateral roadmap was then obtained centered over the vertebral artery origin and distally through the [facility] guide catheter. Over an 0.014 inch standard Synchro micro guidewire with a moderate J configuration, inside of an 021 160 cm Trevo Trak microcatheter combination was advanced to just proximal to the occluded dominant right vertebral artery. Using a torque device with gentle manipulation access was obtained with the micro guidewire through the occluded right vertebral artery without difficulty. The guidewire was advanced to the vertebral artery followed by the microcatheter. The guidewire was removed. Good aspiration obtained from the hub of microcatheter. A control arteriogram was then performed through the microcatheter distally with opacification noted in the right vertebrobasilar junction, the basilar artery, the right posterior cerebral artery, the superior cerebellar arteries and the anterior-inferior cerebellar arteries. Also noted was significant narrowing secondary to an eccentric arteriosclerotic plaque in the mid basilar artery just proximal to the origin of right anterior inferior cerebellar artery. The  microcatheter was then exchanged under constant fluoroscopic guidance for an 014 inch 300 cm Synchro standard micro guidewire with a moderate J configuration at its tip. Measurements were then performed of the most proximal portion of the right vertebral artery from the previous diagnostic arteriogram. It was decided to proceed with initial balloon angioplasty of the origin of the occluded right vertebral artery with a 2.5 mm x 15 mm Apex Monorail angioplasty balloon. This was prepped and purged antegradely with heparinized saline infusion, and retrogradely with  75% contrast and 25% heparinized saline infusion. Using rapid exchange technique, this was then advanced and positioned at the origin of the occluded right vertebral artery with the proximal marker proximal to the origin of the right vertebral artery. Control inflation was then performed using micro inflation syringe device via micro tubing to approximately 8 atmospheres where it was maintained for approximately 15 seconds. The balloon was deflated and retrieved and removed. Control arteriogram performed through [facility] guide catheter demonstrated significantly improved caliber and flow through the angioplastied segment with copious flow noted intracranially. 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 Patency was noted of the posterior cerebral arteries, the superior cerebellar arteries and faintly the anterior-inferior cerebellar arteries bilaterally. Significantly improved caliber was also noted at the origin of the right vertebral artery. A 120 cm Phenom Plus catheter was then advanced over the exchange micro guidewire under fluoroscopic guidance and positioned in the distal right vertebral artery. The Benchmark catheter was now advanced without difficulty into the distal right vertebral artery also. A roadmap was then obtained. Using a torque device, the exchange micro guidewire was safely advanced under fluoroscopic guidance into the proximal basilar artery and then the distal basilar artery into the right posterior cerebral artery P1 segment. Measurements were then performed of the basilar artery distal and proximal to the prominent stenosis. It was decided to proceed with placement of a Resolute Onyx 3 mm x 15 mm balloon mounted stent intracranially. However, the stented stent delivery apparatus could not be advanced through the 045 Phenom Plus catheter. The Phenom Plus catheter was removed with the wire maintained in the right P1  segment. The Benchmark guide catheter was easily then advanced into the right vertebrobasilar junction just proximal to the basilar artery. The Resolute Onyx which had been prepped with heparinized saline infusion retrogradely and angiographically with 60% contrast and 40% heparinized saline infusion was reintroduced and advanced without difficulty over the exchange wire to the distal end of the Benchmark guide catheter. This stent apparatus was easily advanced and positioned with the proximal and the distal markers adequate distant from the site of the significant mid basilar artery stenosis. The stent was then deployed by inflating balloon using a micro inflation syringe device via micro tubing. Balloon was expanded to approximately 2.9 mm. The balloon was then deflated and retrieved. A control arteriogram performed through the Benchmark guide catheter demonstrated excellent apposition and flow through the stented mid basilar artery. Patency was seen of the posterior cerebral arteries, the superior cerebellar arteries and faintly the anterior-inferior cerebellar arteries. The Benchmark catheter was then retrieved more proximally while the exchange wire was retrieved into the V4 segment of the right vertebral artery. The Benchmark catheter was retrieved to just proximal to the origin of the right vertebral artery. Measurements were again performed of the vertebral artery at this site. It was decided to proceed with placement of a 3.5 mm x 18 mm Onyx Frontier balloon mountable stent.  After having been prepped in the usual manner, this was then advanced and positioned such that the proximal portion of the device was just proximal to the origin of the right vertebral artery. Thereafter, controlled inflation was performed of the balloon mountable stent to approximately 8 atmospheres. This was maintained for approximately 10 seconds. The balloon was then deflated and retrieved and removed. A control arteriogram  performed through the Benchmark guide catheter demonstrated excellent apposition and coverage of the previously occluded right vertebral artery origin and proximal portion. Free flow was now noted through the proximal stent into the intracranial portion with patency maintained of the positioned mid basilar artery stent. Control arteriograms were then performed at 15 and 25 minutes post deployment of the devices. These continued to demonstrate excellent apposition with flow extra cranially and intracranially. The patient at this time was also given approximately 4 mg of intra-arterial Integrilin in order to prevent formation of platelet aggregation at the stented site. None was obvious on diagnostic catheter arteriograms. The exchange guidewire was then retrieved and removed. A final control arteriogram performed through the Benchmark catheter and just proximal to the right vertebral artery origin continued to demonstrate excellent flow through the proximal stent and extra cranially and intracranially. There continued to be excellent flow through the basilar artery into the posterior cerebral arteries, the superior cerebellar arteries with faint opacification of both anterior-inferior cerebellar arteries. The Benchmark guide sheath was removed. The radial sheath was then removed with successful hemostasis with a wrist band at the right radial puncture site. Distal right radial pulse was verified to be present. A CT of the brain performed on the table demonstrated no evidence of hemorrhage or hydrocephalus. The patient was then extubated. Upon recovery, the patient was able to converse appropriately. He denied any nausea, vomiting or headaches. He was able to move all 4 extremities spontaneously and to command. His pupils were 2 mm equal and sluggish. No change was seen in the right facial droop. The tongue remained in the midline. The patient was then returned to his room in the ICU in stable condition to continue on  low-dose IV heparin and 81 aspirin daily, and Brilinta 90 mg b.i.d. Early in the evening, the patient was more awake, alert and appropriately responsive. He continued to have dysarthria with right facial droop. Neurological examination continued to have non sustained nystagmus on right lateral gaze and to a lesser degree left lateral gaze with oscillopsia on the right lateral gaze unchanged. 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 PLAN: Follow-up in clinic 2 weeks post discharge. Electronically Signed   By: Luanne Bras M.D.   On: 09/02/2021 05:39     PHYSICAL EXAM  Temp:  [98.8 F (37.1 C)-100 F (37.8 C)] 100 F (37.8 C) (11/10 0527) Pulse Rate:  [92-95] 93 (11/10 0527) Resp:  [17-18] 18 (11/10 0527) BP: (136-149)/(87-98) 136/87 (11/10 1405) SpO2:  [97 %-100 %] 100 % (11/10 0527)  General - Well nourished, well developed, pleasant middle-age Caucasian male alert.  Not in any distress.  Cardiovascular - Regular rhythm and rate.   NEURO:  Mental Status: AA&Ox3. Can hold a conversation. Speech/Language: mild dysarthria  Naming, repetition, fluency, and comprehension intact.  Cranial Nerves:  II: PERRL. Diplopia persists esp on right lateral gaze. There is nystagmus noted on right lateral gaze. Right INO  III, IV, VI: EOMI. Eyelids elevate symmetrically.  V: Patient c/o numbness to face bilaterally but can perceive light touch  VII: Right facial droop present VIII: hearing intact to voice on left, right sided hearing loss persists to finger rub. IX, X: Phonation is normal.  XII: without fasciculations. Speech is dysarthric but intelligible.  Motor: LUE/ LLE 5/5, RUE and RLE 4/5 Tone: is normal and bulk is normal Sensation- Intact to light touch on right,  left sided hemibody numbness persists.  Coordination: Mild FTN intact on left   HKS: no ataxia in LE.No drift.  Gait- deferred  ASSESSMENT/PLAN Mr. Beau Ramsburg is a 65 y.o. male with no known medical history admitted for left-sided numbness, dizziness, headache diplopia.  Developed right facial droop, slurred speech and right hearing loss overnight.  No tPA given due to outside window. Stenting of basilar and vertebral arteries was performed. Repeat MRI post procedure demonstrates worsening and new infarcts. Urinary retention continues, will continue flomax and mobilize patient.  Patient will be discharged to CIR and will return in approximately 6 weeks for stenting of the right carotid artery.  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   Transient dizziness and blurred vision while working with therapy in the upright position 2 days ago likely orthostatic versus.  Repeat CT scan showing evolution of the right cerebellar and brainstem infarct with new density in the middle of unclear significance possibly artifact.  No clinical new symptoms compatible with medullary infarct Resultant right peripheral facial droop (CN VII), right lateral gaze incomplete (CN VI), right hearing loss (CN VIII and AICA territory), left sided numbness (right spinal lemniscus and right trigeminothalamic tract), right cerebellum (right PICA and AICA) CT showed right cerebellar infarct CT head and neck right VA origin occlusion, reconstituted at distal V2.  Right PICA occlusion, mid basilar artery high-grade stenosis versus thrombosis, right ICA 65% stenosis, left VA ends at PICA MRI right cerebellar infarct, and right lower pontine infarct. IR right VA origin, mid to basilar artery prominent stenosis, status post stenting in both arteries.   Severe 90% stenosis right ICA proximal, 50% stenosis left ICA proximal. MRI repeat 11/5 demonstrates increased size of right cerebellar infarct, new patchy right cerebellar infarcts in AICA territors, new infarcts in right middle cerebellar peduncle, right medulla, right and left pons, new punctate infarcts in left parietal and occipital lobes. 2D Echo EF 60 to 65% TEE performed 11/4, LVEF 60-65%No LA/LAAthrombus or mass.+PFO with R-->L shuntingAtherosclerosis of the descending aorta. Recommend 30 day cardiac event monitoring as outpt to rule out afib LDL 220 HgbA1c 5.5 SCDs for DVT prophylaxis No antithrombotic prior to admission, now on aspirin 81 mg daily, Brilinta (ticagrelor) 90 mg bid, Patient counseled to be compliant with his antithrombotic medications Ongoing aggressive stroke risk factor management Therapy recommendations: CIR Disposition: Pending  Carotid stenosis, bilateral CTA head and neck showed right ICA 65% stenosis Cerebral angiogram showed right ICA proximal 90% stenosis, left ICA proximal 50% stenosis Dr. Estanislado Pandy plan for right ICA stenting in the near future likely in 6 weeks after rehab stay.  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 Recheck BMP in am  Leukocytosis WBC 12.2-> 13.6-> 10.8->13.6.   - afebrile will monitor  Patient developed transient dizziness and blurred vision likely due to orthostasis.  CT scan of the head shows expected evolutionary changes in the right cerebellum and brainstem infarct except for a new hypodensity in the medulla which is of unclear significance and there is no  clinical correlation of this with any new neurological symptoms of stroke in this area.  Recommend mobilize out of bed as tolerated and continue ongoing therapies.  Continue dual antiplatelet therapy and aggressive risk factor modification.  No further stroke work-up is necessary.  Long discussion with patient and wife and answered questions.  Discussed with Dr. Jinny Blossom.  Greater than 50% time during this 31-minute visit was spent in counseling and coordination of care about his stroke and neurological worsening and answering questions.  Stroke team will sign off.  Kindly call for questions. Antony Contras, MD Medical Director [facility] Pager: [phone] 09/06/2021 4:00 PM

## 2021-09-06 NOTE — Progress Notes (Signed)
Physical Therapy Session Note  Patient Details  Name: Maurice Little MRN: [id number] Date of Birth: 1956-09-08  Today's Date: 09/06/2021 PT Missed Time: 60 Minutes Missed Time Reason: MD hold (Comment)  Confirmed with PA and MD that patient remains on bedrest at this time awaiting neurology evaluation. Missed 60 minutes of skilled physical therapy.  Ginny Forth , PT, DPT, NCS, CSRS 09/06/2021, 7:49 AM

## 2021-09-06 NOTE — Progress Notes (Signed)
Coude 16Fr foley catheter placed with lidocaine jelly, pt tolerated well. Urine return immediate upon insertion, urine is clear yellow/straw color. Bag placed below bed level. CHG order placed. Pt laying in bed, reports no pain.   Rayburn Ma, LPN

## 2021-09-06 NOTE — Progress Notes (Signed)
PROGRESS NOTE   Subjective/Complaints:   Pt feels like still has freight train in head- still cannot hear from R ear- explained that it doesn't disappear immediately.   PSA 3.32- also has enlarged prostate prior as well.   Can see better today.  Low back hurts- c/w CVA tenderness? U/A (-).   WBC down to 9.1- no left shift- CXR done- is pending.   Spoke to Maurice Little- they will visit today.   ROS:  Pt denies SOB, abd pain, CP, N/V/C/D, and vision changes are better today- can see better.     Objective:   CT HEAD WO CONTRAST ( )  Result Date: 09/05/2021 CLINICAL DATA:  Stroke, follow-up, new vision changes EXAM: CT HEAD WITHOUT CONTRAST TECHNIQUE: Contiguous axial images were obtained from the base of the skull through the vertex without intravenous contrast. COMPARISON:  08/29/2021 CT head, correlation is also made with 09/01/2021 MRI brain FINDINGS: Brain: Increased hypodensity in the right cerebellar hemisphere and pons compared to 08/29/2021, which largely correlates with the restricted diffusion on the 09/01/2021 exam. Additional focal area of hypodensity in the medulla (series 3, image 5 and series 6, image 31) does not correspond to an area of restricted diffusion on the prior MRI. No hemorrhage, mass, mass effect, or midline shift. Vascular: Interval stenting of the basilar artery. Skull: No acute osseous abnormality. Sinuses/Orbits: Mild mucosal thickening in the maxillary sinuses. Other: Trace fluid in right mastoid air cells. IMPRESSION: 1. Increased hypodensity in the right cerebellar hemisphere and pons, which largely correlates with an area of restricted diffusion, with an additional focal area of hypodensity in the medulla, which does not correspond to an area of restricted diffusion on the prior MRI and is concerning for additional area of infarction. 2. Interval stenting of the basilar artery. These results were called  by telephone at the time of interpretation on 09/05/2021 at 5:47 pm to provider Maurice Little , who verbally acknowledged these results. Electronically Signed   By: Maurice Ke M.D.   On: 09/05/2021 17:48   Recent Labs    09/05/21 0511 09/06/21 0830  WBC 13.6* 9.1  HGB 13.4 13.8  HCT 40.0 40.7  PLT 278 290   Recent Labs    09/04/21 0405 09/05/21 0511  NA 135 135  K 4.0 4.7  CL 104 101  CO2 22 25  GLUCOSE 110* 95  BUN 17 19  CREATININE 0.78 0.93  CALCIUM 8.8* 9.1    Intake/Output Summary (Last 24 hours) at 09/06/2021 0904 Last data filed at 09/06/2021 0700 Gross per 24 hour  Intake 680 ml  Output 2950 ml  Net -2270 ml        Physical Exam: Vital Signs Blood pressure (!) 149/94, pulse 93, temperature 100 F (37.8 C), temperature source Oral, resp. rate 18, height 6' (1.829 m), SpO2 100 %.    General: awake, alert, appropriate, sitting up in bed; NAD HENT: dysconjugate gaze; tape on L lens of eyeglasses;  oropharynx moist- R facial droop CV: regular rate; no JVD Pulmonary: CTA B/L; no W/R/R- good air movement GI: soft, NT, ND, (+)BS Psychiatric: appropriate; depressed/flat- irritable Neurological: alert; dysarthric  Musculoskeletal:     Cervical  back: Normal range of motion. No rigidity.     Comments: 5/5 in RUE/RLE/LUE/LLE  Skin:    Comments: R hand IV- look OK No skin breakdown seen  Neurological:     Mental Status: He is alert and oriented to person, place, and time.     Comments: Right facial weakness with flat,dysarthric speech. Diplopia worse on right with horizontal nystagmus--tape on left lens helping with correction. Ataxia with finger to nose Left>Right as well as decrease in LLE motor control. Sensory deficits left face, LUE and LLE.  Very mild slowed processing/but could be due to dysarthria.   Assessment/Plan: 1. Functional deficits which require 3+ hours per day of interdisciplinary therapy in a comprehensive inpatient rehab  setting. Physiatrist is providing close team supervision and 24 hour management of active medical problems listed below. Physiatrist and rehab team continue to assess barriers to discharge/monitor patient progress toward functional and medical goals  Care Tool:  Bathing    Body parts bathed by patient: Right arm, Left arm, Chest, Abdomen, Right upper leg, Left upper leg, Right lower leg, Left lower leg, Face   Body parts bathed by helper: Front perineal area, Buttocks     Bathing assist Assist Level: Maximal Assistance - Patient 24 - 49%     Upper Body Dressing/Undressing Upper body dressing   What is the patient wearing?: Pull over shirt    Upper body assist Assist Level: Minimal Assistance - Patient > 75%    Lower Body Dressing/Undressing Lower body dressing      What is the patient wearing?: Pants, Incontinence brief     Lower body assist Assist for lower body dressing: Maximal Assistance - Patient 25 - 49%     Toileting Toileting Toileting Activity did not occur (Clothing management and hygiene only): N/A (no void or bm) (foley in place)  Toileting assist Assist for toileting: Maximal Assistance - Patient 25 - 49%     Transfers Chair/bed transfer  Transfers assist     Chair/bed transfer assist level: Maximal Assistance - Patient 25 - 49%     Locomotion Ambulation   Ambulation assist   Ambulation activity did not occur: Safety/medical concerns (requires +2 and skilled assist with use of hallway rail)          Walk 10 feet activity   Assist  Walk 10 feet activity did not occur: Safety/medical concerns        Walk 50 feet activity   Assist Walk 50 feet with 2 turns activity did not occur: Safety/medical concerns         Walk 150 feet activity   Assist Walk 150 feet activity did not occur: Safety/medical concerns         Walk 10 feet on uneven surface  activity   Assist Walk 10 feet on uneven surfaces activity did not occur:  Safety/medical concerns         Wheelchair     Assist Is the patient using a wheelchair?: Yes (currently using TIS due to BP)   Wheelchair activity did not occur: N/A         Wheelchair 50 feet with 2 turns activity    Assist            Wheelchair 150 feet activity     Assist          Blood pressure (!) 149/94, pulse 93, temperature 100 F (37.8 C), temperature source Oral, resp. rate 18, height 6' (1.829 m), SpO2 100 %.  Medical Problem List and Plan: 1.  R cerebellar stroke and L parietal and occipital stroke with Sensory issues and balance impairment- due to stroke             -patient may  shower             -ELOS/Goals: 16-18 days- min A  -first day of PT/OT and SLP- con't therapy- but this afternoon put on bedrest due to concern for new stroke- CT looks (+)- Maurice Little called-   11/10- bedrest until seen by Neuro- but once cleared, con't PT/OT and SLP 2.  Antithrombotics: -DVT/anticoagulation:  Pharmaceutical: Lovenox added.              -antiplatelet therapy: ASA/Brilinta. 3. Pain Management: Tylenol prn for HA.  4. Mood: LCSW to follow for evaluation and support.              -antipsychotic agents: N/A 5. Neuropsych: This patient is capable of making decisions on his own behalf. 6. Skin/Wound Care: Routine pressure-relief measures. 7. Fluids/Electrolytes/Nutrition: Monitor I's/O.  Check CMet in AM 8.  HTN: Monitor BP TID.  Orthostatic symptoms reported today. --On hydralazine 25 mg 3 times daily, Cozaar 50 mg twice daily and amlodipine 10 mg daily.  11/9- orthostatic today- decreased losartan to 25 mg BID and decreased Norvasc to 5 mg daily- will add TEDs and ACE wraps and see how pt does.  --We will check orthostatic vital signs. 11/10- BP 149/80s- con't regimen 9.  Urinary retention: Likely exacerbated by constipation. Augment bowel program and remove foley in am.  --Continue Flomax--changed to bedtime to avoid orthostatic symptoms. 11/9-  increased Flomax to 0.8 mg nightly, however will check U/A and Cx due to elevated WBC and retention. 11/10-- U/A (-)- likely retention due to stroke- explained up to 30-40% of stroke patients have retention/incontinence  10. Constipation: Will start patient on Senna S daily.  --MOM today.   11/9- didn't poop- give sorbitol 60cc x1 this afternoon  11/10- had multiple BMs 11.  Leukocytosis: Monitor for fevers and other signs of infection.  11/9- check U/A and Cx- since more elevated- afebrile.   11/10- WBC down to 9k- checking CXR just in case.  12.  Intermittent hypokalemia: Recheck CMET/Mg level in am.    --May need standing dose supplement is on hydralazine. 13.  Hyperglycemia: Likely stress related as Hemoglobin A1c 5.5. 14.  Dyslipidemia: Continue Lipitor. 15. 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   I spent a total of 39 minutes on total care- >50% coordination of care- calling Neuro and speaking with PA and nursing.  Trying to get neuropsych, but explained only have 1 neuropsychologist.   LOS: 2 days A FACE TO FACE EVALUATION WAS PERFORMED  Maurice Little 09/06/2021, 9:04 AM

## 2021-09-06 NOTE — Progress Notes (Signed)
Physical Therapy Session Note  Patient Details  Name: Maurice Little MRN: [id number] Date of Birth: Mar 06, 1956  Today's Date: 09/06/2021 PT Individual Time: 1400-1438 PT Individual Time Calculation (min): 38 min   Short Term Goals: Week 1:  PT Short Term Goal 1 (Week 1): Pt will perform supine<>sit with supervision PT Short Term Goal 2 (Week 1): Pt will perform sit<>stands with mod assist of 1 consistently PT Short Term Goal 3 (Week 1): Pt will perform bed<>chair transfers with mod assist of 1 consistently PT Short Term Goal 4 (Week 1): Pt will ambulate at least 13ft using LRAD with +2 mod assist  Skilled Therapeutic Interventions/Progress Updates:    Per [place], LPN pt has been taken off bedrest and is now cleared to participate in therapy with MD requesting for orthostatic vital sign assessment. Pt received asleep, supine in bed with his sister-in-law present - pt easily awakens and agreeable to therapy session. Assessed vitals as described below. Donned thigh high TED hose total assist in supine. Donned pants mod assist and shoes max assist for time management. Supine>sitting L EOB 2x during session with CGA for trunk steadying/safety due to truncal ataxia upon rising with pt using B UE support to maintain balance - when pt not having B UE support he requires heavy min assist for trunk control due to R lean. Sit>stand from EOB, no AD, but UE handheld support with mod assist for balance while rising with R lateral lean. Pt reports he is "very lightheaded" and "dizzy" with it worsening in standing. Returned to sitting with min/mod assist and then to supine with CGA for trunk control. Pt agreeable to return to sitting EOB to re-assess prolonged sitting vitals as described below. At end of session pt left supine in bed with HOB partially elevated, wearing B LE thigh high TED hose, B LEs elevated, bed alarm on, needs in reach, and his family present.   Supine: BP 136/87 (MAP 102), HR 91bpm  Donned  TEDs Sitting: BP 133/88 (MAP 102), HR 101bpm  Standing: BP 92/69 (MAP 77), HR 120bpm  Supine: BP 148/97 (MAP 114), HR 94bpm  Sitting: BP 134/79 (MAP 97), HR 98bpm  Sitting after 3 minutes: BP 115/89 (MAP 99), HR 100bpm  Supine: BP 131/94 (MAP 106), HR 98bpm   Notified RN and PA of pt's vitals and requested abdominal binder order.  Therapy Documentation Precautions:  Precautions Precautions: Other (comment), Fall Precaution Comments: diplopia, Ataxic R; L side numbness Restrictions Weight Bearing Restrictions: No   Pain: No reports of pain throughout session.  Therapy/Group: Individual Therapy  Ginny Forth , PT, DPT, NCS, CSRS  09/06/2021, 12:32 PM

## 2021-09-07 DIAGNOSIS — I639 Cerebral infarction, unspecified: Secondary | ICD-10-CM | POA: Diagnosis not present

## 2021-09-07 LAB — BASIC METABOLIC PANEL
Anion gap: 8 (ref 5–15)
BUN: 15 mg/dL (ref 8–23)
CO2: 24 mmol/L (ref 22–32)
Calcium: 9 mg/dL (ref 8.9–10.3)
Chloride: 102 mmol/L (ref 98–111)
Creatinine, Ser: 0.87 mg/dL (ref 0.61–1.24)
GFR, Estimated: >60 mL/min (ref >60)
Glucose, Bld: 116 mg/dL — ABNORMAL HIGH (ref 70–99)
Potassium: 4.8 mmol/L (ref 3.5–5.1)
Sodium: 134 mmol/L — ABNORMAL LOW (ref 135–145)

## 2021-09-07 LAB — CBC
HCT: 41.8 % (ref 39.0–52.0)
Hemoglobin: 13.7 g/dL (ref 13.0–17.0)
MCH: 31.4 pg (ref 26.0–34.0)
MCHC: 32.8 g/dL (ref 30.0–36.0)
MCV: 95.7 fL (ref 80.0–100.0)
Platelets: 296 10*3/uL (ref 150–400)
RBC: 4.37 MIL/uL (ref 4.22–5.81)
RDW: 13.5 % (ref 11.5–15.5)
WBC: 7.6 10*3/uL (ref 4.0–10.5)
nRBC: 0 % (ref 0.0–0.2)

## 2021-09-07 MED ORDER — HYDRALAZINE HCL 10 MG PO TABS
10.0000 mg | ORAL_TABLET | Freq: Three times a day (TID) | ORAL | Status: DC
  Administered 2021-09-07 – 2021-09-27 (×59): 10 mg via ORAL
  Filled 2021-09-07 (×59): qty 1

## 2021-09-07 MED ORDER — MIDODRINE HCL 5 MG PO TABS
2.5000 mg | ORAL_TABLET | Freq: Two times a day (BID) | ORAL | Status: DC
  Administered 2021-09-07 – 2021-09-10 (×8): 2.5 mg via ORAL
  Filled 2021-09-07 (×8): qty 1

## 2021-09-07 MED ORDER — PANTOPRAZOLE SODIUM 40 MG PO TBEC
40.0000 mg | DELAYED_RELEASE_TABLET | Freq: Every day | ORAL | Status: DC
  Administered 2021-09-08 – 2021-09-27 (×20): 40 mg via ORAL
  Filled 2021-09-07 (×20): qty 1
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
## 2021-09-07 NOTE — Progress Notes (Signed)
Occupational Therapy Session Note  Patient Details  Name: Maurice Little MRN: [id number] Date of Birth: 10-21-1956  Today's Date: 09/07/2021 OT Individual Time: 6950-7225 OT Individual Time Calculation (min): 72 min    Short Term Goals: Week 1:  OT Short Term Goal 1 (Week 1): Pt will maintain dynamic sitting balance at min guard assist during selfcare tasks sitting unsupported. OT Short Term Goal 2 (Week 1): Pt will complete LB bathing at min assist level sit to stand for two consecutive sessions,. OT Short Term Goal 3 (Week 1): Pt will complete LB dressing sit to stand with mod assist for two consecutive sessions. OT Short Term Goal 4 (Week 1): Pt will complete toilet transfers with min assist sit to stand.  Skilled Therapeutic Interventions/Progress Updates:    Pt completed shower and dressing during session.  He was able to transfer to the EOB with min guard assist and was then able to maintain static sitting with min guard assist.  BP in sitting at 139/84.  The [place] was utilized for transfer into the shower and out to the EOB.  He was able to complete sit to stand in the [place] at min assist level as well with sit to stand from the tub bench and the EOB.  He was able to remove LB clothing with increased time with mod assist and then removed his pullover shirt with supervision.  He was able to complete all bathing at min assist level in sitting with lateral leans for washing his buttocks.  Mod instructional cueing for sequencing bathing tasks.  He was able to use BUEs throughout the session for all bathing and setup with ataxia noted in the RUE.  BP taken while sitting on the tub bench at 104/88.  Pt with report of slight light headedness.  He next completed transfer back out to the EOB with use of the [place] secondary to ataxia and BP issues.  He was able to sit EOB and donn a pullover shirt with increased time and supervision.  He needed mod assist for donning the brief and shorts.  Increased forward  LOB X1 when attempting to place the LLE in the shorts opening.  He was able to sit and comb his hair with setup and increased time using the RUE as well.  Therapist assisted with donning TEDs and pt transitioned to supine to rest.  Encouraged use of the RUE over the weekend for all tasks including self feeding, grooming, opening bottles.  Call button and phone in reach with safety alarm in place.      Therapy Documentation Precautions:  Precautions Precautions: Other (comment), Fall Precaution Comments: diplopia, Ataxic R; L side numbness Restrictions Weight Bearing Restrictions: No   Pain: Pain Assessment Pain Scale: Faces Pain Score: 0-No pain Pain Intervention(s): Medication (See eMAR) ADL: See Care Tool Section for some details of mobility and selfcare  Therapy/Group: Individual Therapy  Jayana Kotula OTR/L 09/07/2021, 12:08 PM

## 2021-09-07 NOTE — IPOC Note (Signed)
Overall Plan of Care [facility]) Patient Details Name: Maurice Little MRN: [id number] DOB: [birth year]  Admitting Diagnosis: Cerebellar stroke [facility])  Hospital Problems: Principal Problem:   Cerebellar stroke (HCC)     Functional Problem List: Nursing Bowel, Bladder, Endurance, Medication Management, Safety  Maurice Little Balance, Safety, Behavior, Sensory, Edema, Skin Integrity, Endurance, Motor, Nutrition, Pain, Perception  OT Balance, Cognition, Endurance, Motor, Vision, Sensory, Safety, Perception  SLP Motor, Sensory  TR         Basic ADL's: OT Eating, Grooming, Bathing, Dressing, Toileting     Advanced  ADL's: OT       Transfers: Maurice Little Bed Mobility, Bed to Chair, Car, Furniture, Floor  OT Toilet, Tub/Shower     Locomotion: Maurice Little Ambulation, Stairs, Wheelchair Mobility     Additional Impairments: OT Fuctional Use of Upper Extremity  SLP Swallowing, Communication expression    TR      Anticipated Outcomes Item Anticipated Outcome  Self Feeding setup  Swallowing  mod I   Basic self-care  setup  Toileting  min guard assist   Bathroom Transfers min guard assist  Bowel/Bladder  Manage bowel w mod I and bladder with min assist  Transfers  CGA using LRAD  Locomotion  CGA using LRAD  Communication  mod I  Cognition     Pain  at or below level 4 with prn meds  Safety/Judgment  maintain safety w cues   Therapy Plan: Maurice Little Intensity: Minimum of 1-2 x/day ,45 to 90 minutes Maurice Little Frequency: 5 out of 7 days Maurice Little Duration Estimated Length of Stay: 2.5-3 weeks OT Intensity: Minimum of 1-2 x/day, 45 to 90 minutes OT Frequency: 5 out of 7 days OT Duration/Estimated Length of Stay: 18-21 days SLP Intensity: Minumum of 1-2 x/day, 30 to 90 minutes SLP Frequency: 3 to 5 out of 7 days SLP Duration/Estimated Length of Stay: 2.5-3   Due to the current state of emergency, patients may not be receiving their 3-hours of Medicare-mandated therapy.   Team Interventions: Nursing Interventions  Medication Management, Discharge Planning, Bowel Management, Disease Management/Prevention, Bladder Management, Pain Management, Patient/Family Education  Maurice Little interventions Ambulation/gait training, Community reintegration, DME/adaptive equipment instruction, Neuromuscular re-education, Psychosocial support, Stair training, UE/LE Strength taining/ROM, Wheelchair propulsion/positioning, Warden/ranger, Discharge planning, Functional electrical stimulation, Pain management, Skin care/wound management, Therapeutic Activities, UE/LE Coordination activities, Cognitive remediation/compensation, Disease management/prevention, Functional mobility training, Patient/family education, Splinting/orthotics, Therapeutic Exercise, Visual/perceptual remediation/compensation  OT Interventions Balance/vestibular training, Discharge planning, Functional electrical stimulation, Self Care/advanced ADL retraining, Therapeutic Activities, UE/LE Coordination activities, Cognitive remediation/compensation, Functional mobility training, Patient/family education, Therapeutic Exercise, DME/adaptive equipment instruction, Neuromuscular re-education, UE/LE Strength taining/ROM, Psychosocial support  SLP Interventions Other (comment), Dysphagia/aspiration precaution training, Functional tasks, Cueing hierarchy, Speech/Language facilitation (further cognitive-linguistic eval)  TR Interventions    SW/CM Interventions Discharge Planning, Psychosocial Support, Patient/Family Education, Disease Management/Prevention   Barriers to Discharge MD  Medical stability, Home enviroment access/loayout, Incontinence, Neurogenic bowel and bladder, Weight bearing restrictions, Behavior, and loss of vision and hearing  Nursing Decreased caregiver support, Home environment access/layout 1 level 3ste bil rail w spouse  Maurice Little Inaccessible home environment    OT      SLP      SW Lack of/limited family support, Insurance for SNF coverage,  Decreased caregiver support, Incontinence, Neurogenic Bowel & Bladder     Team Discharge Planning: Destination: Maurice Little-Home ,OT- Home , SLP-  Projected Follow-up: Maurice Little-Outpatient Maurice Little, 24 hour supervision/assistance, OT-  None, SLP-Outpatient SLP Projected Equipment Needs: Maurice Little-To be determined, OT- 3 in 1 bedside comode, SLP-None  recommended by SLP Equipment Details: Maurice Little- , OT-  Patient/family involved in discharge planning: Maurice Little- Patient, Family member/caregiver,  OT-Patient, Family member/caregiver, SLP-Patient, Family member/caregiver  MD ELOS: 2.5 to 3 weeks Medical Rehab Prognosis:  Good Assessment: Maurice Little is a 65 yr old male with  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  Has HTN, but having severe orthostatic hypotension, which is limiting therapy and cleared by Neuro for therapy. Also having urianry retention and tried to remove foley, but Maurice Little asked for it back due to painful caths.  Also has R ICA stenosis- needs stenting in future.    Goals CGA to min A in 2.5 to 3 weeks   See Team Conference Notes for weekly updates to the plan of care

## 2021-09-07 NOTE — Discharge Summary (Addendum)
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  Dysarthria Diplopia Ataxia    LABORATORY STUDIES CBC    Component Value Date/Time   WBC 7.6 09/07/2021 0543   RBC 4.37 09/07/2021 0543   HGB 13.7 09/07/2021 0543   HCT 41.8 09/07/2021 0543   PLT 296 09/07/2021 0543   MCV 95.7 09/07/2021 0543   MCH 31.4 09/07/2021 0543   MCHC 32.8 09/07/2021 0543   RDW 13.5 09/07/2021 0543   LYMPHSABS 1.6 09/06/2021 0830   MONOABS 1.1 (H) 09/06/2021 0830   EOSABS 0.6 (H) 09/06/2021 0830   BASOSABS 0.1 09/06/2021 0830   CMP    Component Value Date/Time   NA 134 (L) 09/07/2021 0543   K 4.8 09/07/2021 0543   CL 102 09/07/2021 0543   CO2 24 09/07/2021 0543   GLUCOSE 116 (H) 09/07/2021 0543   BUN 15 09/07/2021 0543   CREATININE 0.87 09/07/2021 0543   CALCIUM 9.0 09/07/2021 0543   PROT 6.2 (L) 09/05/2021 0511   ALBUMIN 3.3 (L) 09/05/2021 0511   AST 27 09/05/2021 0511   ALT 44 09/05/2021 0511   ALKPHOS 73 09/05/2021 0511   BILITOT 0.6 09/05/2021 0511   GFRNONAA >60 09/07/2021 0543   COAGS Lab Results  Component Value Date   INR 1.0 08/29/2021   INR 1.0 08/29/2021   Lipid Panel    Component Value Date/Time   CHOL 288 (H) 08/29/2021 2340   TRIG 82 08/29/2021 2340   HDL 52 08/29/2021 2340   CHOLHDL 5.5 08/29/2021 2340   VLDL 16 08/29/2021 2340   LDLCALC 220 (H) 08/29/2021 2340   HgbA1C  Lab Results  Component Value Date   HGBA1C 5.5 08/29/2021   Urinalysis    Component  Value Date/Time   COLORURINE STRAW (A) 09/05/2021 2217   APPEARANCEUR CLEAR 09/05/2021 2217   LABSPEC 1.008 09/05/2021 2217   PHURINE 7.0 09/05/2021 2217   GLUCOSEU NEGATIVE 09/05/2021 2217   HGBUR MODERATE (A) 09/05/2021 2217   BILIRUBINUR NEGATIVE 09/05/2021 2217   KETONESUR NEGATIVE 09/05/2021 2217   PROTEINUR NEGATIVE 09/05/2021 2217   NITRITE NEGATIVE 09/05/2021 2217   LEUKOCYTESUR NEGATIVE 09/05/2021 2217   Urine Drug Screen     Component Value Date/Time   LABOPIA NONE DETECTED 08/30/2021 0751   COCAINSCRNUR NONE DETECTED 08/30/2021 0751   LABBENZ NONE DETECTED 08/30/2021 0751   AMPHETMU NONE DETECTED 08/30/2021 0751   THCU POSITIVE (A) 08/30/2021 0751   LABBARB NONE DETECTED 08/30/2021 0751    Alcohol Level No results found for: [facility]   SIGNIFICANT DIAGNOSTIC STUDIES DG Chest 2 View  Result Date: 09/06/2021 CLINICAL DATA:  Leukocytosis EXAM: CHEST - 2 VIEW COMPARISON:  None. FINDINGS: Heart size and mediastinal contours are within normal limits. No suspicious pulmonary opacities identified. No pleural effusion or pneumothorax visualized. No acute osseous abnormality appreciated. IMPRESSION: No acute intrathoracic process identified. Electronically Signed   By: Ofilia Neas M.D.   On: 09/06/2021 09:31   CT HEAD WO  CONTRAST (5MM)  Result Date: 09/05/2021 CLINICAL DATA:  Stroke, follow-up, new vision changes EXAM: CT HEAD WITHOUT CONTRAST TECHNIQUE: Contiguous axial images were obtained from the base of the skull through the vertex without intravenous contrast. COMPARISON:  08/29/2021 CT head, correlation is also made with 09/01/2021 MRI brain FINDINGS: Brain: Increased hypodensity in the right cerebellar hemisphere and pons compared to 08/29/2021, which largely correlates with the restricted diffusion on the 09/01/2021 exam. Additional focal area of hypodensity in the medulla (series 3, image 5 and series 6, image 31) does not correspond to an area of restricted diffusion  on the prior MRI. No hemorrhage, mass, mass effect, or midline shift. Vascular: Interval stenting of the basilar artery. Skull: No acute osseous abnormality. Sinuses/Orbits: Mild mucosal thickening in the maxillary sinuses. Other: Trace fluid in right mastoid air cells. IMPRESSION: 1. Increased hypodensity in the right cerebellar hemisphere and pons, which largely correlates with an area of restricted diffusion, with an additional focal area of hypodensity in the medulla, which does not correspond to an area of restricted diffusion on the prior MRI and is concerning for additional area of infarction. 2. Interval stenting of the basilar artery. These results were called by telephone at the time of interpretation on 09/05/2021 at 5:47 pm to provider PAMELA LOVE , who verbally acknowledged these results. Electronically Signed   By: Merilyn Baba M.D.   On: 09/05/2021 17:48   MR BRAIN WO CONTRAST  Result Date: 09/01/2021 CLINICAL DATA:  Stroke, follow-up. 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 EXAM: MRI HEAD WITHOUT CONTRAST TECHNIQUE: Multiplanar, multiecho pulse sequences of the brain and surrounding structures were obtained without intravenous contrast. COMPARISON:  Head MRI 08/29/2021 FINDINGS: A limited examination was performed at the request of the ordering provider consisting of axial and coronal diffusion weighted imaging and axial susceptibility weighted imaging. There is a large acute infarct in the right cerebellar hemisphere in the PICA territory which has increased in size from the prior MRI. Additional new patchy acute right cerebellar infarcts are present in the AICA territory, and there are also new and larger acute infarcts involving the right middle cerebellar peduncle, right medulla, and right greater than left pons. New punctate acute infarcts are noted in the medial aspects of the left parietal and left occipital lobes.  There is a small amount of petechial hemorrhage associated with the right PICA infarct. 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 Electronically Signed   By: Logan Bores M.D.   On: 09/01/2021 19:52   MR BRAIN WO CONTRAST  Result Date: 08/29/2021 CLINICAL DATA:  Stroke, follow up EXAM: MRI HEAD WITHOUT CONTRAST TECHNIQUE: Multiplanar, multiecho pulse sequences of the brain and surrounding structures were obtained without intravenous contrast. COMPARISON:  None. FINDINGS: Brain: Reduced diffusion is present in the inferior right cerebellum. Additional small foci of involvement in the right brachium pontis and right pontomedullary junction. No significant mass effect. No evidence of intracranial hemorrhage. Patchy foci of T2 hyperintensity in the supratentorial white matter are nonspecific but may reflect mild chronic microvascular ischemic changes. Ventricles and sulci are within normal limits in size and configuration. Vascular: Major vessel flow voids at the skull base are preserved. Skull and upper cervical spine: Normal marrow signal is preserved. Sinuses/Orbits: Paranasal sinus mucosal thickening. Orbits are unremarkable. Other: Sella is unremarkable.  Mastoid air cells are clear. 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 Small additional involvement of right  brachium pontis and right pontomedullary junction. No hemorrhage or significant mass effect. Mild chronic microvascular ischemic changes. Electronically Signed   By: Macy Mis M.D.   On: 08/29/2021 13:03   IR Intra Cran Stent  Result Date: 09/02/2021 CLINICAL DATA:  History of progressive onset of dysarthria, nausea, vomiting and left-sided numbness. Patient also developed diplopia. 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 EXAM: INTRACRANIAL STENT (INCL PTA) COMPARISON:  CT angiogram of the head and neck of August 29, 2021, and MRI of the brain of August 29, 2021. MEDICATIONS: Heparin 4,000 units IV. Ancef 2 g IV antibiotic was administered within 1 hour of the procedure. ANESTHESIA/SEDATION: Mac anesthesia as per Department of Anesthesiology for diagnostic portion followed by general anesthesia for endovascular treatment portion. CONTRAST:  Omnipaque 300 approximately 150 mL. FLUOROSCOPY TIME:  Fluoroscopy Time: 41 minutes 18 seconds (2310 mGy). COMPLICATIONS: None immediate. TECHNIQUE: Informed written consent was obtained from the patient after a thorough discussion of the procedural risks, benefits and alternatives. All questions were addressed. Maximal Sterile Barrier Technique was utilized including caps, mask, sterile gowns, sterile gloves, sterile drape, hand hygiene and skin antiseptic. A timeout was performed prior to the initiation of the procedure. The right forearm to the wrist was prepped and draped in the usual sterile manner. The right radial artery was then identified with ultrasound and its morphology documented. A dorsal palmar anastomosis was verified to be present. Using ultrasound guidance, and a micropuncture set access into the right radial artery was obtained with a 6/7 French radial sheath. The obturator, and the guidewire were removed. Good aspiration was obtained from the side port of the sheath. Cocktail of 2000 units of heparin, 2.5 mg of verapamil, and 200 mcg of nitroglycerin was then infused through the sheath in diluted form without event. A right radial arteriogram was performed. Over a 0.035 inch Roadrunner guidewire, a 5 [place] Simmons 2 diagnostic catheter was advanced to the aortic arch region, and arteriograms were performed of the right common carotid artery, the left common carotid artery, the left vertebral artery, and the right subclavian artery at the origin of stump of  the occluded right vertebral artery. FINDINGS: The diagnostic study was somewhat marred by persistent motion artifact. The left common carotid arteriogram demonstrates the left external carotid artery and its major branches to be widely patent. The left internal carotid artery at the bulb demonstrates approximately 50% stenosis without evidence of intraluminal filling defects. More distally, the left internal carotid artery is seen to opacify to the cranial skull base. 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 A left posterior communicating artery is seen opacifying the left posterior cerebral artery distribution. The left middle cerebral artery and the left anterior cerebral artery opacify into the capillary and venous phases. Non dominant left vertebral artery demonstrates patency at its origin with brisk ascent to the cranial skull base to supply primarily the ipsilateral left posteroinferior cerebellar artery. The right common carotid arteriogram demonstrates the right external carotid artery and its major branches to be widely patent. The right internal carotid artery at the bulb has approximately 90% stenosis due to a circumferential smooth plaque. More distally, the vessel is seen to opacify to the cranial skull base. The petrous, the cavernous and the supraclinoid segments are widely patent. A  right posterior communicating artery is seen opacifying the right posterior cerebral artery distribution. The right middle and the right anterior cerebral arteries opacify into the capillary and venous phases. The right subclavian arteriogram at the origin of the occluded right vertebral artery demonstrates partial reconstitution of the right vertebral artery at the level of C3-C4 from collaterals arising from the cervical branch of the thyrocervical trunk with opacification noted more distally into the  dominant right vertebrobasilar junction. Opacification is noted into the basilar artery, and retrogradely in the right vertebral artery to just above the site of the occlusion in the proximal right vertebral artery. ENDOVASCULAR REVASCULARIZATION OF OCCLUDED DOMINANT PROXIMAL RIGHT VERTEBRAL ARTERY WITH STENT ASSISTED ANGIOPLASTY, AND OF THE MID BASILAR ARTERY WITH STENT ASSISTED ANGIOPLASTY Over the exchange 035 inch 300 cm Rosen exchange guidewire, the combination of a 95 cm Benchmark guide catheter with a support 5.5 [place] Berenstein catheter was advanced and positioned just proximal to the stump of the occluded dominant right vertebral artery. The guidewire and the support catheter were gently retrieved and removed. Good aspiration was obtained from the hub of the Benchmark guide catheter. Bilateral roadmap was then obtained centered over the vertebral artery origin and distally through the [facility] guide catheter. Over an 0.014 inch standard Synchro micro guidewire with a moderate J configuration, inside of an 021 160 cm Trevo Trak microcatheter combination was advanced to just proximal to the occluded dominant right vertebral artery. Using a torque device with gentle manipulation access was obtained with the micro guidewire through the occluded right vertebral artery without difficulty. The guidewire was advanced to the vertebral artery followed by the microcatheter. The guidewire was removed. Good aspiration obtained from the hub of microcatheter. A control arteriogram was then performed through the microcatheter distally with opacification noted in the right vertebrobasilar junction, the basilar artery, the right posterior cerebral artery, the superior cerebellar arteries and the anterior-inferior cerebellar arteries. Also noted was significant narrowing secondary to an eccentric arteriosclerotic plaque in the mid basilar artery just proximal to the origin of right anterior inferior cerebellar artery. The  microcatheter was then exchanged under constant fluoroscopic guidance for an 014 inch 300 cm Synchro standard micro guidewire with a moderate J configuration at its tip. Measurements were then performed of the most proximal portion of the right vertebral artery from the previous diagnostic arteriogram. It was decided to proceed with initial balloon angioplasty of the origin of the occluded right vertebral artery with a 2.5 mm x 15 mm Apex Monorail angioplasty balloon. This was prepped and purged antegradely with heparinized saline infusion, and retrogradely with 75% contrast and 25% heparinized saline infusion. Using rapid exchange technique, this was then advanced and positioned at the origin of the occluded right vertebral artery with the proximal marker proximal to the origin of the right vertebral artery. Control inflation was then performed using micro inflation syringe device via micro tubing to approximately 8 atmospheres where it was maintained for approximately 15 seconds. The balloon was deflated and retrieved and removed. Control arteriogram performed through [facility] guide catheter demonstrated significantly improved caliber and flow through the angioplastied segment with copious flow noted intracranially. 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 Patency was noted of the posterior cerebral arteries, the superior cerebellar arteries and faintly the anterior-inferior cerebellar arteries bilaterally. Significantly improved caliber was also noted at the origin of the right vertebral artery. A 120 cm Phenom Plus catheter was then advanced over the exchange micro guidewire  under fluoroscopic guidance and positioned in the distal right vertebral artery. The Benchmark catheter was now advanced without difficulty into the distal right vertebral artery also. A roadmap was then obtained. Using a torque device, the exchange micro guidewire  was safely advanced under fluoroscopic guidance into the proximal basilar artery and then the distal basilar artery into the right posterior cerebral artery P1 segment. Measurements were then performed of the basilar artery distal and proximal to the prominent stenosis. It was decided to proceed with placement of a Resolute Onyx 3 mm x 15 mm balloon mounted stent intracranially. However, the stented stent delivery apparatus could not be advanced through the 045 Phenom Plus catheter. The Phenom Plus catheter was removed with the wire maintained in the right P1 segment. The Benchmark guide catheter was easily then advanced into the right vertebrobasilar junction just proximal to the basilar artery. The Resolute Onyx which had been prepped with heparinized saline infusion retrogradely and angiographically with 60% contrast and 40% heparinized saline infusion was reintroduced and advanced without difficulty over the exchange wire to the distal end of the Benchmark guide catheter. This stent apparatus was easily advanced and positioned with the proximal and the distal markers adequate distant from the site of the significant mid basilar artery stenosis. The stent was then deployed by inflating balloon using a micro inflation syringe device via micro tubing. Balloon was expanded to approximately 2.9 mm. The balloon was then deflated and retrieved. A control arteriogram performed through the Benchmark guide catheter demonstrated excellent apposition and flow through the stented mid basilar artery. Patency was seen of the posterior cerebral arteries, the superior cerebellar arteries and faintly the anterior-inferior cerebellar arteries. The Benchmark catheter was then retrieved more proximally while the exchange wire was retrieved into the V4 segment of the right vertebral artery. The Benchmark catheter was retrieved to just proximal to the origin of the right vertebral artery. Measurements were again performed of the  vertebral artery at this site. It was decided to proceed with placement of a 3.5 mm x 18 mm Onyx Frontier balloon mountable stent. After having been prepped in the usual manner, this was then advanced and positioned such that the proximal portion of the device was just proximal to the origin of the right vertebral artery. Thereafter, controlled inflation was performed of the balloon mountable stent to approximately 8 atmospheres. This was maintained for approximately 10 seconds. The balloon was then deflated and retrieved and removed. A control arteriogram performed through the Benchmark guide catheter demonstrated excellent apposition and coverage of the previously occluded right vertebral artery origin and proximal portion. Free flow was now noted through the proximal stent into the intracranial portion with patency maintained of the positioned mid basilar artery stent. Control arteriograms were then performed at 15 and 25 minutes post deployment of the devices. These continued to demonstrate excellent apposition with flow extra cranially and intracranially. The patient at this time was also given approximately 4 mg of intra-arterial Integrilin in order to prevent formation of platelet aggregation at the stented site. None was obvious on diagnostic catheter arteriograms. The exchange guidewire was then retrieved and removed. A final control arteriogram performed through the Benchmark catheter and just proximal to the right vertebral artery origin continued to demonstrate excellent flow through the proximal stent and extra cranially and intracranially. There continued to be excellent flow through the basilar artery into the posterior cerebral arteries, the superior cerebellar arteries with faint opacification of both anterior-inferior cerebellar arteries. The Benchmark guide sheath was  removed. The radial sheath was then removed with successful hemostasis with a wrist band at the right radial puncture site. Distal  right radial pulse was verified to be present. A CT of the brain performed on the table demonstrated no evidence of hemorrhage or hydrocephalus. The patient was then extubated. Upon recovery, the patient was able to converse appropriately. He denied any nausea, vomiting or headaches. He was able to move all 4 extremities spontaneously and to command. His pupils were 2 mm equal and sluggish. No change was seen in the right facial droop. The tongue remained in the midline. The patient was then returned to his room in the ICU in stable condition to continue on low-dose IV heparin and 81 aspirin daily, and Brilinta 90 mg b.i.d. Early in the evening, the patient was more awake, alert and appropriately responsive. He continued to have dysarthria with right facial droop. Neurological examination continued to have non sustained nystagmus on right lateral gaze and to a lesser degree left lateral gaze with oscillopsia on the right lateral gaze unchanged. 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 PLAN: Follow-up in clinic 2 weeks post discharge. Electronically Signed   By: Luanne Bras M.D.   On: 09/02/2021 05:39   IR CT Head Ltd  Result Date: 09/02/2021 CLINICAL DATA:  History of progressive onset of dysarthria, nausea, vomiting and left-sided numbness. Patient also developed diplopia. 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 EXAM: INTRACRANIAL STENT (INCL PTA) COMPARISON:  CT angiogram of the head and neck of August 29, 2021, and MRI of the brain of August 29, 2021. MEDICATIONS: Heparin 4,000 units IV. Ancef 2 g IV  antibiotic was administered within 1 hour of the procedure. ANESTHESIA/SEDATION: Mac anesthesia as per Department of Anesthesiology for diagnostic portion followed by general anesthesia for endovascular treatment portion. CONTRAST:  Omnipaque 300 approximately 150 mL. FLUOROSCOPY TIME:  Fluoroscopy Time: 41 minutes 18 seconds (2310 mGy). COMPLICATIONS: None immediate. TECHNIQUE: Informed written consent was obtained from the patient after a thorough discussion of the procedural risks, benefits and alternatives. All questions were addressed. Maximal Sterile Barrier Technique was utilized including caps, mask, sterile gowns, sterile gloves, sterile drape, hand hygiene and skin antiseptic. A timeout was performed prior to the initiation of the procedure. The right forearm to the wrist was prepped and draped in the usual sterile manner. The right radial artery was then identified with ultrasound and its morphology documented. A dorsal palmar anastomosis was verified to be present. Using ultrasound guidance, and a micropuncture set access into the right radial artery was obtained with a 6/7 French radial sheath. The obturator, and the guidewire were removed. Good aspiration was obtained from the side port of the sheath. Cocktail of 2000 units of heparin, 2.5 mg of verapamil, and 200 mcg of nitroglycerin was then infused through the sheath in diluted form without event. A right radial arteriogram was performed. Over a 0.035 inch Roadrunner guidewire, a 5 [place] Simmons 2 diagnostic catheter was advanced to the aortic arch region, and arteriograms were performed of the right common carotid artery, the left common carotid artery, the left vertebral artery, and the right subclavian artery at the origin of stump of the occluded right vertebral  artery. FINDINGS: The diagnostic study was somewhat marred by persistent motion artifact. The left common carotid arteriogram demonstrates the left external carotid artery and its major  branches to be widely patent. The left internal carotid artery at the bulb demonstrates approximately 50% stenosis without evidence of intraluminal filling defects. More distally, the left internal carotid artery is seen to opacify to the cranial skull base. 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 A left posterior communicating artery is seen opacifying the left posterior cerebral artery distribution. The left middle cerebral artery and the left anterior cerebral artery opacify into the capillary and venous phases. Non dominant left vertebral artery demonstrates patency at its origin with brisk ascent to the cranial skull base to supply primarily the ipsilateral left posteroinferior cerebellar artery. The right common carotid arteriogram demonstrates the right external carotid artery and its major branches to be widely patent. The right internal carotid artery at the bulb has approximately 90% stenosis due to a circumferential smooth plaque. More distally, the vessel is seen to opacify to the cranial skull base. The petrous, the cavernous and the supraclinoid segments are widely patent. A right posterior communicating artery is seen opacifying the right posterior cerebral artery distribution. The right middle and the right anterior cerebral arteries opacify into the capillary and venous phases. The right subclavian arteriogram at the origin of the occluded right vertebral artery demonstrates partial reconstitution of the right vertebral artery at the level of C3-C4 from collaterals arising from the cervical branch of the thyrocervical trunk with opacification noted more distally into the dominant right vertebrobasilar junction. Opacification is noted into the basilar artery, and retrogradely in the right vertebral artery to just above the site of the occlusion in the proximal right vertebral artery.  ENDOVASCULAR REVASCULARIZATION OF OCCLUDED DOMINANT PROXIMAL RIGHT VERTEBRAL ARTERY WITH STENT ASSISTED ANGIOPLASTY, AND OF THE MID BASILAR ARTERY WITH STENT ASSISTED ANGIOPLASTY Over the exchange 035 inch 300 cm Rosen exchange guidewire, the combination of a 95 cm Benchmark guide catheter with a support 5.5 [place] Berenstein catheter was advanced and positioned just proximal to the stump of the occluded dominant right vertebral artery. The guidewire and the support catheter were gently retrieved and removed. Good aspiration was obtained from the hub of the Benchmark guide catheter. Bilateral roadmap was then obtained centered over the vertebral artery origin and distally through the [facility] guide catheter. Over an 0.014 inch standard Synchro micro guidewire with a moderate J configuration, inside of an 021 160 cm Trevo Trak microcatheter combination was advanced to just proximal to the occluded dominant right vertebral artery. Using a torque device with gentle manipulation access was obtained with the micro guidewire through the occluded right vertebral artery without difficulty. The guidewire was advanced to the vertebral artery followed by the microcatheter. The guidewire was removed. Good aspiration obtained from the hub of microcatheter. A control arteriogram was then performed through the microcatheter distally with opacification noted in the right vertebrobasilar junction, the basilar artery, the right posterior cerebral artery, the superior cerebellar arteries and the anterior-inferior cerebellar arteries. Also noted was significant narrowing secondary to an eccentric arteriosclerotic plaque in the mid basilar artery just proximal to the origin of right anterior inferior cerebellar artery. The microcatheter was then exchanged under constant fluoroscopic guidance for an 014 inch 300 cm Synchro standard micro guidewire with a moderate J configuration at its tip. Measurements were then performed of the most  proximal portion  of the right vertebral artery from the previous diagnostic arteriogram. It was decided to proceed with initial balloon angioplasty of the origin of the occluded right vertebral artery with a 2.5 mm x 15 mm Apex Monorail angioplasty balloon. This was prepped and purged antegradely with heparinized saline infusion, and retrogradely with 75% contrast and 25% heparinized saline infusion. Using rapid exchange technique, this was then advanced and positioned at the origin of the occluded right vertebral artery with the proximal marker proximal to the origin of the right vertebral artery. Control inflation was then performed using micro inflation syringe device via micro tubing to approximately 8 atmospheres where it was maintained for approximately 15 seconds. The balloon was deflated and retrieved and removed. Control arteriogram performed through [facility] guide catheter demonstrated significantly improved caliber and flow through the angioplastied segment with copious flow noted intracranially. 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 Patency was noted of the posterior cerebral arteries, the superior cerebellar arteries and faintly the anterior-inferior cerebellar arteries bilaterally. Significantly improved caliber was also noted at the origin of the right vertebral artery. A 120 cm Phenom Plus catheter was then advanced over the exchange micro guidewire under fluoroscopic guidance and positioned in the distal right vertebral artery. The Benchmark catheter was now advanced without difficulty into the distal right vertebral artery also. A roadmap was then obtained. Using a torque device, the exchange micro guidewire was safely advanced under fluoroscopic guidance into the proximal basilar artery and then the distal basilar artery into the right posterior cerebral artery P1 segment. Measurements were then performed of the basilar  artery distal and proximal to the prominent stenosis. It was decided to proceed with placement of a Resolute Onyx 3 mm x 15 mm balloon mounted stent intracranially. However, the stented stent delivery apparatus could not be advanced through the 045 Phenom Plus catheter. The Phenom Plus catheter was removed with the wire maintained in the right P1 segment. The Benchmark guide catheter was easily then advanced into the right vertebrobasilar junction just proximal to the basilar artery. The Resolute Onyx which had been prepped with heparinized saline infusion retrogradely and angiographically with 60% contrast and 40% heparinized saline infusion was reintroduced and advanced without difficulty over the exchange wire to the distal end of the Benchmark guide catheter. This stent apparatus was easily advanced and positioned with the proximal and the distal markers adequate distant from the site of the significant mid basilar artery stenosis. The stent was then deployed by inflating balloon using a micro inflation syringe device via micro tubing. Balloon was expanded to approximately 2.9 mm. The balloon was then deflated and retrieved. A control arteriogram performed through the Benchmark guide catheter demonstrated excellent apposition and flow through the stented mid basilar artery. Patency was seen of the posterior cerebral arteries, the superior cerebellar arteries and faintly the anterior-inferior cerebellar arteries. The Benchmark catheter was then retrieved more proximally while the exchange wire was retrieved into the V4 segment of the right vertebral artery. The Benchmark catheter was retrieved to just proximal to the origin of the right vertebral artery. Measurements were again performed of the vertebral artery at this site. It was decided to proceed with placement of a 3.5 mm x 18 mm Onyx Frontier balloon mountable stent. After having been prepped in the usual manner, this was then advanced and positioned such  that the proximal portion of the device was just proximal to the origin of the right vertebral artery. Thereafter, controlled  inflation was performed of the balloon mountable stent to approximately 8 atmospheres. This was maintained for approximately 10 seconds. The balloon was then deflated and retrieved and removed. A control arteriogram performed through the Benchmark guide catheter demonstrated excellent apposition and coverage of the previously occluded right vertebral artery origin and proximal portion. Free flow was now noted through the proximal stent into the intracranial portion with patency maintained of the positioned mid basilar artery stent. Control arteriograms were then performed at 15 and 25 minutes post deployment of the devices. These continued to demonstrate excellent apposition with flow extra cranially and intracranially. The patient at this time was also given approximately 4 mg of intra-arterial Integrilin in order to prevent formation of platelet aggregation at the stented site. None was obvious on diagnostic catheter arteriograms. The exchange guidewire was then retrieved and removed. A final control arteriogram performed through the Benchmark catheter and just proximal to the right vertebral artery origin continued to demonstrate excellent flow through the proximal stent and extra cranially and intracranially. There continued to be excellent flow through the basilar artery into the posterior cerebral arteries, the superior cerebellar arteries with faint opacification of both anterior-inferior cerebellar arteries. The Benchmark guide sheath was removed. The radial sheath was then removed with successful hemostasis with a wrist band at the right radial puncture site. Distal right radial pulse was verified to be present. A CT of the brain performed on the table demonstrated no evidence of hemorrhage or hydrocephalus. The patient was then extubated. Upon recovery, the patient was able to  converse appropriately. He denied any nausea, vomiting or headaches. He was able to move all 4 extremities spontaneously and to command. His pupils were 2 mm equal and sluggish. No change was seen in the right facial droop. The tongue remained in the midline. The patient was then returned to his room in the ICU in stable condition to continue on low-dose IV heparin and 81 aspirin daily, and Brilinta 90 mg b.i.d. Early in the evening, the patient was more awake, alert and appropriately responsive. He continued to have dysarthria with right facial droop. Neurological examination continued to have non sustained nystagmus on right lateral gaze and to a lesser degree left lateral gaze with oscillopsia on the right lateral gaze unchanged. 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 PLAN: Follow-up in clinic 2 weeks post discharge. Electronically Signed   By: Luanne Bras M.D.   On: 09/02/2021 05:39   IR US Guide Vasc Access Right  Result Date: 09/02/2021 CLINICAL DATA:  History of progressive onset of dysarthria, nausea, vomiting and left-sided numbness. Patient also developed diplopia. 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 EXAM: INTRACRANIAL STENT (INCL PTA) COMPARISON:  CT angiogram of the head and neck of August 29, 2021, and MRI of the brain of August 29, 2021. MEDICATIONS: Heparin 4,000 units IV. Ancef 2 g IV antibiotic was administered within 1 hour of the procedure. ANESTHESIA/SEDATION: Mac anesthesia as per Department of Anesthesiology for diagnostic portion followed by general anesthesia for endovascular  treatment portion. CONTRAST:  Omnipaque 300 approximately 150 mL. FLUOROSCOPY TIME:  Fluoroscopy Time: 41 minutes 18 seconds (2310 mGy). COMPLICATIONS: None immediate. TECHNIQUE: Informed written consent was obtained from  the patient after a thorough discussion of the procedural risks, benefits and alternatives. All questions were addressed. Maximal Sterile Barrier Technique was utilized including caps, mask, sterile gowns, sterile gloves, sterile drape, hand hygiene and skin antiseptic. A timeout was performed prior to the initiation of the procedure. The right forearm to the wrist was prepped and draped in the usual sterile manner. The right radial artery was then identified with ultrasound and its morphology documented. A dorsal palmar anastomosis was verified to be present. Using ultrasound guidance, and a micropuncture set access into the right radial artery was obtained with a 6/7 French radial sheath. The obturator, and the guidewire were removed. Good aspiration was obtained from the side port of the sheath. Cocktail of 2000 units of heparin, 2.5 mg of verapamil, and 200 mcg of nitroglycerin was then infused through the sheath in diluted form without event. A right radial arteriogram was performed. Over a 0.035 inch Roadrunner guidewire, a 5 [place] Simmons 2 diagnostic catheter was advanced to the aortic arch region, and arteriograms were performed of the right common carotid artery, the left common carotid artery, the left vertebral artery, and the right subclavian artery at the origin of stump of the occluded right vertebral artery. FINDINGS: The diagnostic study was somewhat marred by persistent motion artifact. The left common carotid arteriogram demonstrates the left external carotid artery and its major branches to be widely patent. The left internal carotid artery at the bulb demonstrates approximately 50% stenosis without evidence of intraluminal filling defects. More distally, the left internal  carotid artery is seen to opacify to the cranial skull base. 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 A left posterior communicating artery is seen opacifying the left posterior cerebral artery distribution. The left middle cerebral artery and the left anterior cerebral artery opacify into the capillary and venous phases. Non dominant left vertebral artery demonstrates patency at its origin with brisk ascent to the cranial skull base to supply primarily the ipsilateral left posteroinferior cerebellar artery. The right common carotid arteriogram demonstrates the right external carotid artery and its major branches to be widely patent. The right internal carotid artery at the bulb has approximately 90% stenosis due to a circumferential smooth plaque. More distally, the vessel is seen to opacify to the cranial skull base. The petrous, the cavernous and the supraclinoid segments are widely patent. A right posterior communicating artery is seen opacifying the right posterior cerebral artery distribution. The right middle and the right anterior cerebral arteries opacify into the capillary and venous phases. The right subclavian arteriogram at the origin of the occluded right vertebral artery demonstrates partial reconstitution of the right vertebral artery at the level of C3-C4 from collaterals arising from the cervical branch of the thyrocervical trunk with opacification noted more distally into the dominant right vertebrobasilar junction. Opacification is noted into the basilar artery, and retrogradely in the right vertebral artery to just above the site of the occlusion in the proximal right vertebral artery. ENDOVASCULAR REVASCULARIZATION OF OCCLUDED DOMINANT PROXIMAL RIGHT VERTEBRAL ARTERY WITH STENT ASSISTED ANGIOPLASTY, AND OF THE MID BASILAR ARTERY WITH STENT ASSISTED ANGIOPLASTY Over the exchange  035 inch 300 cm Rosen exchange guidewire, the combination of a 95 cm Benchmark guide catheter with a support 5.5 [place] Berenstein catheter was advanced and positioned just proximal to the stump of the occluded dominant right vertebral artery. The guidewire and the support catheter were gently retrieved and removed.  Good aspiration was obtained from the hub of the Benchmark guide catheter. Bilateral roadmap was then obtained centered over the vertebral artery origin and distally through the [facility] guide catheter. Over an 0.014 inch standard Synchro micro guidewire with a moderate J configuration, inside of an 021 160 cm Trevo Trak microcatheter combination was advanced to just proximal to the occluded dominant right vertebral artery. Using a torque device with gentle manipulation access was obtained with the micro guidewire through the occluded right vertebral artery without difficulty. The guidewire was advanced to the vertebral artery followed by the microcatheter. The guidewire was removed. Good aspiration obtained from the hub of microcatheter. A control arteriogram was then performed through the microcatheter distally with opacification noted in the right vertebrobasilar junction, the basilar artery, the right posterior cerebral artery, the superior cerebellar arteries and the anterior-inferior cerebellar arteries. Also noted was significant narrowing secondary to an eccentric arteriosclerotic plaque in the mid basilar artery just proximal to the origin of right anterior inferior cerebellar artery. The microcatheter was then exchanged under constant fluoroscopic guidance for an 014 inch 300 cm Synchro standard micro guidewire with a moderate J configuration at its tip. Measurements were then performed of the most proximal portion of the right vertebral artery from the previous diagnostic arteriogram. It was decided to proceed with initial balloon angioplasty of the origin of the occluded right vertebral  artery with a 2.5 mm x 15 mm Apex Monorail angioplasty balloon. This was prepped and purged antegradely with heparinized saline infusion, and retrogradely with 75% contrast and 25% heparinized saline infusion. Using rapid exchange technique, this was then advanced and positioned at the origin of the occluded right vertebral artery with the proximal marker proximal to the origin of the right vertebral artery. Control inflation was then performed using micro inflation syringe device via micro tubing to approximately 8 atmospheres where it was maintained for approximately 15 seconds. The balloon was deflated and retrieved and removed. Control arteriogram performed through [facility] guide catheter demonstrated significantly improved caliber and flow through the angioplastied segment with copious flow noted intracranially. 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 Patency was noted of the posterior cerebral arteries, the superior cerebellar arteries and faintly the anterior-inferior cerebellar arteries bilaterally. Significantly improved caliber was also noted at the origin of the right vertebral artery. A 120 cm Phenom Plus catheter was then advanced over the exchange micro guidewire under fluoroscopic guidance and positioned in the distal right vertebral artery. The Benchmark catheter was now advanced without difficulty into the distal right vertebral artery also. A roadmap was then obtained. Using a torque device, the exchange micro guidewire was safely advanced under fluoroscopic guidance into the proximal basilar artery and then the distal basilar artery into the right posterior cerebral artery P1 segment. Measurements were then performed of the basilar artery distal and proximal to the prominent stenosis. It was decided to proceed with placement of a Resolute Onyx 3 mm x 15 mm balloon mounted stent intracranially. However, the stented stent  delivery apparatus could not be advanced through the 045 Phenom Plus catheter. The Phenom Plus catheter was removed with the wire maintained in the right P1 segment. The Benchmark guide catheter was easily then advanced into the right vertebrobasilar junction just proximal to the basilar artery. The Resolute Onyx which had been prepped with heparinized saline infusion retrogradely and angiographically with 60% contrast and 40% heparinized saline infusion was reintroduced and advanced without difficulty over the exchange wire  to the distal end of the Benchmark guide catheter. This stent apparatus was easily advanced and positioned with the proximal and the distal markers adequate distant from the site of the significant mid basilar artery stenosis. The stent was then deployed by inflating balloon using a micro inflation syringe device via micro tubing. Balloon was expanded to approximately 2.9 mm. The balloon was then deflated and retrieved. A control arteriogram performed through the Benchmark guide catheter demonstrated excellent apposition and flow through the stented mid basilar artery. Patency was seen of the posterior cerebral arteries, the superior cerebellar arteries and faintly the anterior-inferior cerebellar arteries. The Benchmark catheter was then retrieved more proximally while the exchange wire was retrieved into the V4 segment of the right vertebral artery. The Benchmark catheter was retrieved to just proximal to the origin of the right vertebral artery. Measurements were again performed of the vertebral artery at this site. It was decided to proceed with placement of a 3.5 mm x 18 mm Onyx Frontier balloon mountable stent. After having been prepped in the usual manner, this was then advanced and positioned such that the proximal portion of the device was just proximal to the origin of the right vertebral artery. Thereafter, controlled inflation was performed of the balloon mountable stent to  approximately 8 atmospheres. This was maintained for approximately 10 seconds. The balloon was then deflated and retrieved and removed. A control arteriogram performed through the Benchmark guide catheter demonstrated excellent apposition and coverage of the previously occluded right vertebral artery origin and proximal portion. Free flow was now noted through the proximal stent into the intracranial portion with patency maintained of the positioned mid basilar artery stent. Control arteriograms were then performed at 15 and 25 minutes post deployment of the devices. These continued to demonstrate excellent apposition with flow extra cranially and intracranially. The patient at this time was also given approximately 4 mg of intra-arterial Integrilin in order to prevent formation of platelet aggregation at the stented site. None was obvious on diagnostic catheter arteriograms. The exchange guidewire was then retrieved and removed. A final control arteriogram performed through the Benchmark catheter and just proximal to the right vertebral artery origin continued to demonstrate excellent flow through the proximal stent and extra cranially and intracranially. There continued to be excellent flow through the basilar artery into the posterior cerebral arteries, the superior cerebellar arteries with faint opacification of both anterior-inferior cerebellar arteries. The Benchmark guide sheath was removed. The radial sheath was then removed with successful hemostasis with a wrist band at the right radial puncture site. Distal right radial pulse was verified to be present. A CT of the brain performed on the table demonstrated no evidence of hemorrhage or hydrocephalus. The patient was then extubated. Upon recovery, the patient was able to converse appropriately. He denied any nausea, vomiting or headaches. He was able to move all 4 extremities spontaneously and to command. His pupils were 2 mm equal and sluggish. No change  was seen in the right facial droop. The tongue remained in the midline. The patient was then returned to his room in the ICU in stable condition to continue on low-dose IV heparin and 81 aspirin daily, and Brilinta 90 mg b.i.d. Early in the evening, the patient was more awake, alert and appropriately responsive. He continued to have dysarthria with right facial droop. Neurological examination continued to have non sustained nystagmus on right lateral gaze and to a lesser degree left lateral gaze with oscillopsia on the right lateral gaze unchanged. 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 PLAN: Follow-up in clinic 2 weeks post discharge. Electronically Signed   By: Luanne Bras M.D.   On: 09/02/2021 05:39   CT CEREBRAL PERFUSION W CONTRAST  Result Date: 08/29/2021 CLINICAL DATA:  Neuro deficit, acute, stroke suspected EXAM: CT ANGIOGRAPHY HEAD AND NECK CT PERFUSION BRAIN TECHNIQUE: Multidetector CT imaging of the head and neck was performed using the standard protocol during bolus administration of intravenous contrast. Multiplanar CT image reconstructions and MIPs were obtained to evaluate the vascular anatomy. Carotid stenosis measurements (when applicable) are obtained utilizing NASCET criteria, using the distal internal carotid diameter as the denominator. Multiphase CT imaging of the brain was performed following IV bolus contrast injection. Subsequent parametric perfusion maps were calculated using RAPID software. CONTRAST:  100 mL Omnipaque 350 COMPARISON:  None. FINDINGS: CTA NECK Aortic arch: Great vessel origins are patent. Right carotid system: Patent. Noncalcified plaque at the ICA origin causes 65% stenosis. Left carotid system: Patent. Mixed plaque at the ICA origin  causes less than 50% stenosis. Vertebral arteries: Left vertebral artery is patent. Absent opacification of the proximal right vertebral artery at the origin with minimal reconstitution of the V1 segment followed by reocclusion. The V2 segment is occluded proximally with reconstitution at the C4 level. Skeleton: Mild cervical spine degenerative changes, greatest at C6-C7. Other neck: Unremarkable. Upper chest: No apical lung mass. Review of the MIP images confirms the above findings CTA HEAD Anterior circulation: Intracranial internal carotid arteries are patent with mild calcified plaque. Anterior and middle cerebral arteries are patent. Posterior circulation: There is decreased caliber of the intracranial left vertebral artery throughout its course. Appears to terminate at patent left PICA. Intracranial right vertebral artery is patent. Basilar is patent but there is low density within the lumen proximally. Superior cerebellar artery origins are patent. Bilateral posterior communicating arteries are present. Posterior cerebral arteries are patent. Venous sinuses: As permitted by contrast timing, patent. Review of the MIP images confirms the above findings CT Brain Perfusion Findings: CBF (<30%) Volume: 84m Perfusion (Tmax>6.0s) volume: 063mMismatch Volume: 61m65mnfarction Location: None. IMPRESSION: Abnormal extracranial right vertebral artery with limited opacification proximally and reconstitution at the C4 level. May reflect high-grade origin stenosis. Right PICA or like AICA origins are not identified. Basilar artery is patent but there is question of nonocclusive clot proximally. Noncalcified plaque at the right ICA origin causes 65% stenosis. Mixed plaque at the left ICA origin causes less than 50% stenosis. Diffuse mild narrowing of the intracranial left vertebral artery, which terminates as a PICA. Perfusion imaging demonstrates no evidence of core infarction or penumbra, but there is limited evaluation of  the posterior fossa. Initial results were provided by telephone at the time of interpretation on 08/29/2021 at 10:20 am to [facility] these results. Electronically Signed   By: PraMacy MisD.   On: 08/29/2021 10:44   ECHOCARDIOGRAM COMPLETE  Result Date: 08/30/2021    ECHOCARDIOGRAM REPORT   Patient Name:   REICAMERONY Date of Exam: 08/30/2021 Medical Rec #:  [id number]:       72.0 in Accession #:    [id number]:       202.8 lb Date of Birth:  1/106-07-1957BSA:          2.143 m  Patient Age:    65 years   BP:           155/93 mmHg Patient Gender: M          HR:           76 bpm. Exam Location:  Inpatient Procedure: 2D Echo, Color Doppler and Cardiac Doppler Indications:    Eval for PFO  History:        Patient has no prior history of Echocardiogram examinations.  Sonographer:    Jyl Heinz Referring Phys: [id number] [place]  1. Left ventricular ejection fraction, by estimation, is 60 to 65%. The left ventricle has normal function. The left ventricle has no regional wall motion abnormalities. Left ventricular diastolic parameters are consistent with Grade II diastolic dysfunction (pseudonormalization).  2. Right ventricular systolic function is normal. The right ventricular size is normal. Tricuspid regurgitation signal is inadequate for assessing PA pressure.  3. The mitral valve is normal in structure. No evidence of mitral valve regurgitation. No evidence of mitral stenosis.  4. The aortic valve is tricuspid. Aortic valve regurgitation is not visualized. Mild aortic valve sclerosis is present, with no evidence of aortic valve stenosis.  5. The inferior vena cava is normal in size with greater than 50% respiratory variability, suggesting right atrial pressure of 3 mmHg. FINDINGS  Left Ventricle: Left ventricular ejection fraction, by estimation, is 60 to 65%. The left ventricle has normal function. The left ventricle has no regional wall motion  abnormalities. The left ventricular internal cavity size was normal in size. There is  no left ventricular hypertrophy. Left ventricular diastolic parameters are consistent with Grade II diastolic dysfunction (pseudonormalization). Right Ventricle: The right ventricular size is normal. No increase in right ventricular wall thickness. Right ventricular systolic function is normal. Tricuspid regurgitation signal is inadequate for assessing PA pressure. Left Atrium: Left atrial size was normal in size. Right Atrium: Right atrial size was normal in size. Pericardium: There is no evidence of pericardial effusion. Mitral Valve: The mitral valve is normal in structure. No evidence of mitral valve regurgitation. No evidence of mitral valve stenosis. Tricuspid Valve: The tricuspid valve is normal in structure. Tricuspid valve regurgitation is not demonstrated. Aortic Valve: The aortic valve is tricuspid. Aortic valve regurgitation is not visualized. Mild aortic valve sclerosis is present, with no evidence of aortic valve stenosis. Aortic valve peak gradient measures 9.4 mmHg. Pulmonic Valve: The pulmonic valve was normal in structure. Pulmonic valve regurgitation is trivial. Aorta: The aortic root is normal in size and structure. Venous: The inferior vena cava is normal in size with greater than 50% respiratory variability, suggesting right atrial pressure of 3 mmHg. IAS/Shunts: No atrial level shunt detected by color flow Doppler.  LEFT VENTRICLE PLAX 2D LVIDd:         4.40 cm      Diastology LVIDs:         2.80 cm      LV e' medial:    7.07 cm/s LV PW:         1.00 cm      LV E/e' medial:  8.9 LV IVS:        1.00 cm      LV e' lateral:   6.85 cm/s LVOT diam:     2.10 cm      LV E/e' lateral: 9.2 LV SV:         84 LV SV Index:   39 LVOT Area:     3.46 cm  LV Volumes (MOD) LV vol d, MOD A2C: 96.1 ml LV vol d, MOD A4C: 107.0 ml LV vol s, MOD A2C: 43.0 ml LV vol s, MOD A4C: 44.4 ml LV SV MOD A2C:     53.1 ml LV SV MOD A4C:      107.0 ml LV SV MOD BP:      58.1 ml RIGHT VENTRICLE             IVC RV Basal diam:  2.60 cm     IVC diam: 1.70 cm RV Mid diam:    2.50 cm RV S prime:     15.40 cm/s TAPSE (M-mode): 2.5 cm LEFT ATRIUM           Index        RIGHT ATRIUM           Index LA diam:      3.60 cm 1.68 cm/m   RA Area:     14.90 cm LA Vol (A2C): 37.2 ml 17.36 ml/m  RA Volume:   29.30 ml  13.67 ml/m LA Vol (A4C): 50.2 ml 23.42 ml/m  AORTIC VALVE AV Area (Vmax): 2.94 cm AV Vmax:        153.00 cm/s AV Peak Grad:   9.4 mmHg LVOT Vmax:      130.00 cm/s LVOT Vmean:     73.000 cm/s LVOT VTI:       0.243 m  AORTA Ao Root diam: 2.90 cm Ao Asc diam:  3.40 cm MITRAL VALVE MV Area (PHT): 3.31 cm    SHUNTS MV Decel Time: 229 msec    Systemic VTI:  0.24 m MV E velocity: 62.90 cm/s  Systemic Diam: 2.10 cm MV A velocity: 67.00 cm/s MV E/A ratio:  0.94 Dalton McleanMD Electronically signed by Franki Monte Signature Date/Time: 08/30/2021/2:45:21 PM    Final    ECHO TEE  Result Date: 09/05/2021    TRANSESOPHOGEAL ECHO REPORT   Patient Name:   Maurice Little Date of Exam: 08/31/2021 Medical Rec #:  [id number]  Height:       72.0 in Accession #:    [id number] Weight:       202.8 lb Date of Birth:  1956-07-12   BSA:          2.143 m Patient Age:    43 years   BP:           127/74 mmHg Patient Gender: M          HR:           97 bpm. Exam Location:  Inpatient Procedure: Cardiac Doppler, Color Doppler, Transesophageal Echo and Saline            Contrast Bubble Study Indications:     Stroke  History:         Patient has prior history of Echocardiogram examinations, most                  recent 08/30/2021.  Sonographer:     Merrie Roof RDCS Referring Phys:  [id number] RHONDA G BARRETT Diagnosing Phys: Skeet Latch MD PROCEDURE: After discussion of the risks and benefits of a TEE, an informed consent was obtained from the patient. The transesophogeal probe was passed without difficulty through the esophogus of the patient. Local oropharyngeal anesthetic was  provided with Cetacaine. Sedation performed by different physician. The patient was monitored while under deep sedation. The patient's vital signs; including heart rate, blood pressure, and oxygen saturation; remained stable throughout the procedure.  The patient developed no complications during the procedure. IMPRESSIONS  1. Left ventricular ejection fraction, by estimation, is 55 to 60%. The left ventricle has normal function. The left ventricle has no regional wall motion abnormalities.  2. Right ventricular systolic function is normal. The right ventricular size is normal.  3. No left atrial/left atrial appendage thrombus was detected.  4. The mitral valve is normal in structure. Trivial mitral valve regurgitation. No evidence of mitral stenosis.  5. The aortic valve is tricuspid. Aortic valve regurgitation is not visualized. No aortic stenosis is present.  6. 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  7. The inferior vena cava is normal in size with greater than 50% respiratory variability, suggesting right atrial pressure of 3 mmHg.  8. Agitated saline contrast bubble study was positive with shunting observed within 3-6 cardiac cycles suggestive of interatrial shunt. There is a small patent foramen ovale with predominantly right to left shunting across the atrial septum. Conclusion(s)/Recommendation(s): Normal biventricular function without evidence of hemodynamically significant valvular heart disease. FINDINGS  Left Ventricle: Left ventricular ejection fraction, by estimation, is 55 to 60%. The left ventricle has normal function. The left ventricle has no regional wall motion abnormalities. The left ventricular internal cavity size was normal in size. There is  no left ventricular hypertrophy. Right Ventricle: The right ventricular size is normal. No increase in right ventricular wall thickness. Right ventricular systolic function is normal. Left Atrium: Left atrial size  was normal in size. No left atrial/left atrial appendage thrombus was detected. Right Atrium: Right atrial size was normal in size. Pericardium: There is no evidence of pericardial effusion. Mitral Valve: The mitral valve is normal in structure. Trivial mitral valve regurgitation. No evidence of mitral valve stenosis. Tricuspid Valve: The tricuspid valve is normal in structure. Tricuspid valve regurgitation is not demonstrated. No evidence of tricuspid stenosis. Aortic Valve: The aortic valve is tricuspid. Aortic valve regurgitation is not visualized. No aortic stenosis is present. Pulmonic Valve: The pulmonic valve was normal in structure. Pulmonic valve regurgitation is trivial. No evidence of pulmonic stenosis. Aorta: The aortic root is normal in size and structure. There is moderate (Grade III) atheroma plaque involving the descending aorta. Venous: The inferior vena cava is normal in size with greater than 50% respiratory variability, suggesting right atrial pressure of 3 mmHg. IAS/Shunts: No atrial level shunt detected by color flow Doppler. Agitated saline contrast was given intravenously to evaluate for intracardiac shunting. Agitated saline contrast bubble study was positive with shunting observed within 3-6 cardiac cycles suggestive of interatrial shunt. A small patent foramen ovale is detected with predominantly right to left shunting across the atrial septum. Skeet Latch MD Electronically signed by Skeet Latch MD Signature Date/Time: 09/05/2021/11:24:08 AM    Final    CT HEAD CODE STROKE WO CONTRAST  Result Date: 08/29/2021 CLINICAL DATA:  Code stroke.  Left-sided weakness EXAM: CT HEAD WITHOUT CONTRAST TECHNIQUE: Contiguous axial images were obtained from the base of the skull through the vertex without intravenous contrast. COMPARISON:  None. FINDINGS: Brain: There is no acute intracranial hemorrhage, mass effect, or edema. Gray-white differentiation is preserved. There is an  age-indeterminate small infarct of the right inferior cerebellum. Ventricles and sulci are normal in size and configuration. No extra-axial collection. Vascular: No hyperdense vessel. Intracranial atherosclerotic calcification at the skull base. Skull: Unremarkable. Sinuses/Orbits: Lobular mucosal thickening. Orbits are unremarkable. Other: Mastoid air cells are clear. ASPECTS [facility] Stroke Program Early CT Score) - Ganglionic level infarction (caudate,  lentiform nuclei, internal capsule, insula, M1-M3 cortex): 7 - Supraganglionic infarction (M4-M6 cortex): 3 Total score (0-10 with 10 being normal): 10 IMPRESSION: There is no acute intracranial hemorrhage. ASPECT score is 10. 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 These results were communicated to Dr. Rory Percy at 9:44 am on 08/29/2021 by text page via the [facility] messaging system. Electronically Signed   By: Macy Mis M.D.   On: 08/29/2021 09:46   VAS [place] LOWER EXTREMITY VENOUS (DVT)  Result Date: 08/31/2021  Lower Venous DVT Study Patient Name:  Maurice Little  Date of Exam:   08/31/2021 Medical Rec #: [id number]   Accession #:    [id number] Date of Birth: 08/22/1956    Patient Gender: M Patient Age:   59 years Exam Location:  [facility] Procedure:      VAS [place] LOWER EXTREMITY VENOUS (DVT) Referring Phys: Cornelius Moras XU --------------------------------------------------------------------------------  Indications: Stroke.  Comparison Study: no prior Performing Technologist: Archie Patten RVS  Examination Guidelines: A complete evaluation includes B-mode imaging, spectral Doppler, color Doppler, and power Doppler as needed of all accessible portions of each vessel. Bilateral testing is considered an integral part of a complete examination. Limited examinations for reoccurring indications may be performed as noted. The reflux portion of the exam is performed with the patient in reverse Trendelenburg.   +---------+---------------+---------+-----------+----------+--------------+  RIGHT     Compressibility Phasicity Spontaneity Properties Thrombus Aging  +---------+---------------+---------+-----------+----------+--------------+  CFV       Full            Yes       Yes                                    +---------+---------------+---------+-----------+----------+--------------+  SFJ       Full                                                             +---------+---------------+---------+-----------+----------+--------------+  FV Prox   Full                                                             +---------+---------------+---------+-----------+----------+--------------+  FV Mid    Full                                                             +---------+---------------+---------+-----------+----------+--------------+  FV Distal Full                                                             +---------+---------------+---------+-----------+----------+--------------+  PFV       Full                                                             +---------+---------------+---------+-----------+----------+--------------+  POP       Full            Yes       Yes                                    +---------+---------------+---------+-----------+----------+--------------+  PTV       Full                                                             +---------+---------------+---------+-----------+----------+--------------+  PERO      Full                                                             +---------+---------------+---------+-----------+----------+--------------+   +---------+---------------+---------+-----------+----------+--------------+  LEFT      Compressibility Phasicity Spontaneity Properties Thrombus Aging  +---------+---------------+---------+-----------+----------+--------------+  CFV       Full            Yes       Yes                                     +---------+---------------+---------+-----------+----------+--------------+  SFJ       Full                                                             +---------+---------------+---------+-----------+----------+--------------+  FV Prox   Full                                                             +---------+---------------+---------+-----------+----------+--------------+  FV Mid    Full                                                             +---------+---------------+---------+-----------+----------+--------------+  FV Distal Full                                                             +---------+---------------+---------+-----------+----------+--------------+  PFV       Full                                                             +---------+---------------+---------+-----------+----------+--------------+  POP       Full            Yes       Yes                                    +---------+---------------+---------+-----------+----------+--------------+  PTV       Full                                                             +---------+---------------+---------+-----------+----------+--------------+  PERO      Full                                                             +---------+---------------+---------+-----------+----------+--------------+     Summary: BILATERAL: - No evidence of deep vein thrombosis seen in the lower extremities, bilaterally. - No evidence of superficial venous thrombosis in the lower extremities, bilaterally. -   *See table(s) above for measurements and observations. Electronically signed by Monica Martinez MD on 08/31/2021 at 12:34:17 PM.    Final    CT ANGIO HEAD NECK W WO CM (CODE STROKE)  Result Date: 08/29/2021 CLINICAL DATA:  Neuro deficit, acute, stroke suspected EXAM: CT ANGIOGRAPHY HEAD AND NECK CT PERFUSION BRAIN TECHNIQUE: Multidetector CT imaging of the head and neck was performed using the standard protocol during bolus administration of  intravenous contrast. Multiplanar CT image reconstructions and MIPs were obtained to evaluate the vascular anatomy. Carotid stenosis measurements (when applicable) are obtained utilizing NASCET criteria, using the distal internal carotid diameter as the denominator. Multiphase CT imaging of the brain was performed following IV bolus contrast injection. Subsequent parametric perfusion maps were calculated using RAPID software. CONTRAST:  100 mL Omnipaque 350 COMPARISON:  None. FINDINGS: CTA NECK Aortic arch: Great vessel origins are patent. Right carotid system: Patent. Noncalcified plaque at the ICA origin causes 65% stenosis. Left carotid system: Patent. Mixed plaque at the ICA origin causes less than 50% stenosis. Vertebral arteries: Left vertebral artery is patent. Absent opacification of the proximal right vertebral artery at the origin with minimal reconstitution of the V1 segment followed by reocclusion. The V2 segment is occluded proximally with reconstitution at the C4 level. Skeleton: Mild cervical spine degenerative changes, greatest at C6-C7. Other neck: Unremarkable. Upper chest: No apical lung mass. Review of the MIP images confirms the above findings CTA HEAD Anterior circulation: Intracranial internal carotid arteries are patent with mild calcified plaque. Anterior and middle cerebral arteries are patent. Posterior circulation: There is decreased caliber of the intracranial left vertebral artery throughout its course. Appears to terminate at patent left PICA. Intracranial right vertebral artery is patent. Basilar is patent but there is low density within the lumen proximally. Superior cerebellar artery origins are patent. Bilateral posterior communicating arteries are present. Posterior cerebral arteries are patent. Venous sinuses: As permitted by contrast timing, patent. Review of the MIP images confirms the above findings CT Brain Perfusion Findings: CBF (<30%) Volume: 24m Perfusion (Tmax>6.0s)  volume: 016mMismatch Volume: 33m4mnfarction Location: None. IMPRESSION: Abnormal extracranial right vertebral  artery with limited opacification proximally and reconstitution at the C4 level. May reflect high-grade origin stenosis. Right PICA or like AICA origins are not identified. Basilar artery is patent but there is question of nonocclusive clot proximally. Noncalcified plaque at the right ICA origin causes 65% stenosis. Mixed plaque at the left ICA origin causes less than 50% stenosis. Diffuse mild narrowing of the intracranial left vertebral artery, which terminates as a PICA. Perfusion imaging demonstrates no evidence of core infarction or penumbra, but there is limited evaluation of the posterior fossa. Initial results were provided by telephone at the time of interpretation on 08/29/2021 at 10:20 am to provider [facility] , who verbally acknowledged these results. Electronically Signed   By: Macy Mis M.D.   On: 08/29/2021 10:44   IR ANGIO VERTEBRAL SEL SUBCLAVIAN INNOMINATE UNI L MOD SED  Result Date: 09/02/2021 CLINICAL DATA:  History of progressive onset of dysarthria, nausea, vomiting and left-sided numbness. Patient also developed diplopia. 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 EXAM: INTRACRANIAL STENT (INCL PTA) COMPARISON:  CT angiogram of the head and neck of August 29, 2021, and MRI of the brain of August 29, 2021. MEDICATIONS: Heparin 4,000 units IV. Ancef 2 g IV antibiotic was administered within 1 hour of the procedure. ANESTHESIA/SEDATION: Mac anesthesia as per Department of Anesthesiology for diagnostic portion followed by general anesthesia for endovascular treatment portion. CONTRAST:  Omnipaque 300 approximately 150 mL. FLUOROSCOPY TIME:  Fluoroscopy Time: 41 minutes 18 seconds (2310 mGy).  COMPLICATIONS: None immediate. TECHNIQUE: Informed written consent was obtained from the patient after a thorough discussion of the procedural risks, benefits and alternatives. All questions were addressed. Maximal Sterile Barrier Technique was utilized including caps, mask, sterile gowns, sterile gloves, sterile drape, hand hygiene and skin antiseptic. A timeout was performed prior to the initiation of the procedure. The right forearm to the wrist was prepped and draped in the usual sterile manner. The right radial artery was then identified with ultrasound and its morphology documented. A dorsal palmar anastomosis was verified to be present. Using ultrasound guidance, and a micropuncture set access into the right radial artery was obtained with a 6/7 French radial sheath. The obturator, and the guidewire were removed. Good aspiration was obtained from the side port of the sheath. Cocktail of 2000 units of heparin, 2.5 mg of verapamil, and 200 mcg of nitroglycerin was then infused through the sheath in diluted form without event. A right radial arteriogram was performed. Over a 0.035 inch Roadrunner guidewire, a 5 [place] Simmons 2 diagnostic catheter was advanced to the aortic arch region, and arteriograms were performed of the right common carotid artery, the left common carotid artery, the left vertebral artery, and the right subclavian artery at the origin of stump of the occluded right vertebral artery. FINDINGS: The diagnostic study was somewhat marred by persistent motion artifact. The left common carotid arteriogram demonstrates the left external carotid artery and its major branches to be widely patent. The left internal carotid artery at the bulb demonstrates approximately 50% stenosis without evidence of intraluminal filling defects. More distally, the left internal carotid artery is seen to opacify to the cranial skull base. 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 A left posterior communicating artery is seen opacifying the left posterior cerebral artery distribution.  The left middle cerebral artery and the left anterior cerebral artery opacify into the capillary and venous phases. Non dominant left vertebral artery demonstrates patency at its origin with brisk ascent to the cranial skull base to supply primarily the ipsilateral left posteroinferior cerebellar artery. The right common carotid arteriogram demonstrates the right external carotid artery and its major branches to be widely patent. The right internal carotid artery at the bulb has approximately 90% stenosis due to a circumferential smooth plaque. More distally, the vessel is seen to opacify to the cranial skull base. The petrous, the cavernous and the supraclinoid segments are widely patent. A right posterior communicating artery is seen opacifying the right posterior cerebral artery distribution. The right middle and the right anterior cerebral arteries opacify into the capillary and venous phases. The right subclavian arteriogram at the origin of the occluded right vertebral artery demonstrates partial reconstitution of the right vertebral artery at the level of C3-C4 from collaterals arising from the cervical branch of the thyrocervical trunk with opacification noted more distally into the dominant right vertebrobasilar junction. Opacification is noted into the basilar artery, and retrogradely in the right vertebral artery to just above the site of the occlusion in the proximal right vertebral artery. ENDOVASCULAR REVASCULARIZATION OF OCCLUDED DOMINANT PROXIMAL RIGHT VERTEBRAL ARTERY WITH STENT ASSISTED ANGIOPLASTY, AND OF THE MID BASILAR ARTERY WITH STENT ASSISTED ANGIOPLASTY Over the exchange 035 inch 300 cm Rosen exchange guidewire, the combination of a 95 cm Benchmark guide catheter with a support 5.5 [place] Berenstein catheter  was advanced and positioned just proximal to the stump of the occluded dominant right vertebral artery. The guidewire and the support catheter were gently retrieved and removed. Good aspiration was obtained from the hub of the Benchmark guide catheter. Bilateral roadmap was then obtained centered over the vertebral artery origin and distally through the [facility] guide catheter. Over an 0.014 inch standard Synchro micro guidewire with a moderate J configuration, inside of an 021 160 cm Trevo Trak microcatheter combination was advanced to just proximal to the occluded dominant right vertebral artery. Using a torque device with gentle manipulation access was obtained with the micro guidewire through the occluded right vertebral artery without difficulty. The guidewire was advanced to the vertebral artery followed by the microcatheter. The guidewire was removed. Good aspiration obtained from the hub of microcatheter. A control arteriogram was then performed through the microcatheter distally with opacification noted in the right vertebrobasilar junction, the basilar artery, the right posterior cerebral artery, the superior cerebellar arteries and the anterior-inferior cerebellar arteries. Also noted was significant narrowing secondary to an eccentric arteriosclerotic plaque in the mid basilar artery just proximal to the origin of right anterior inferior cerebellar artery. The microcatheter was then exchanged under constant fluoroscopic guidance for an 014 inch 300 cm Synchro standard micro guidewire with a moderate J configuration at its tip. Measurements were then performed of the most proximal portion of the right vertebral artery from the previous diagnostic arteriogram. It was decided to proceed with initial balloon angioplasty of the origin of the occluded right vertebral artery with a 2.5 mm x 15 mm Apex Monorail angioplasty balloon. This was prepped and purged antegradely with heparinized saline infusion, and  retrogradely with 75% contrast and 25% heparinized saline infusion. Using rapid exchange technique, this was then advanced and positioned at the origin of the occluded right vertebral artery with the proximal marker proximal to the origin of the right vertebral artery. Control inflation was then performed using micro inflation syringe device  via micro tubing to approximately 8 atmospheres where it was maintained for approximately 15 seconds. The balloon was deflated and retrieved and removed. Control arteriogram performed through [facility] guide catheter demonstrated significantly improved caliber and flow through the angioplastied segment with copious flow noted intracranially. 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 Patency was noted of the posterior cerebral arteries, the superior cerebellar arteries and faintly the anterior-inferior cerebellar arteries bilaterally. Significantly improved caliber was also noted at the origin of the right vertebral artery. A 120 cm Phenom Plus catheter was then advanced over the exchange micro guidewire under fluoroscopic guidance and positioned in the distal right vertebral artery. The Benchmark catheter was now advanced without difficulty into the distal right vertebral artery also. A roadmap was then obtained. Using a torque device, the exchange micro guidewire was safely advanced under fluoroscopic guidance into the proximal basilar artery and then the distal basilar artery into the right posterior cerebral artery P1 segment. Measurements were then performed of the basilar artery distal and proximal to the prominent stenosis. It was decided to proceed with placement of a Resolute Onyx 3 mm x 15 mm balloon mounted stent intracranially. However, the stented stent delivery apparatus could not be advanced through the 045 Phenom Plus catheter. The Phenom Plus catheter was removed with the wire maintained in  the right P1 segment. The Benchmark guide catheter was easily then advanced into the right vertebrobasilar junction just proximal to the basilar artery. The Resolute Onyx which had been prepped with heparinized saline infusion retrogradely and angiographically with 60% contrast and 40% heparinized saline infusion was reintroduced and advanced without difficulty over the exchange wire to the distal end of the Benchmark guide catheter. This stent apparatus was easily advanced and positioned with the proximal and the distal markers adequate distant from the site of the significant mid basilar artery stenosis. The stent was then deployed by inflating balloon using a micro inflation syringe device via micro tubing. Balloon was expanded to approximately 2.9 mm. The balloon was then deflated and retrieved. A control arteriogram performed through the Benchmark guide catheter demonstrated excellent apposition and flow through the stented mid basilar artery. Patency was seen of the posterior cerebral arteries, the superior cerebellar arteries and faintly the anterior-inferior cerebellar arteries. The Benchmark catheter was then retrieved more proximally while the exchange wire was retrieved into the V4 segment of the right vertebral artery. The Benchmark catheter was retrieved to just proximal to the origin of the right vertebral artery. Measurements were again performed of the vertebral artery at this site. It was decided to proceed with placement of a 3.5 mm x 18 mm Onyx Frontier balloon mountable stent. After having been prepped in the usual manner, this was then advanced and positioned such that the proximal portion of the device was just proximal to the origin of the right vertebral artery. Thereafter, controlled inflation was performed of the balloon mountable stent to approximately 8 atmospheres. This was maintained for approximately 10 seconds. The balloon was then deflated and retrieved and removed. A control  arteriogram performed through the Benchmark guide catheter demonstrated excellent apposition and coverage of the previously occluded right vertebral artery origin and proximal portion. Free flow was now noted through the proximal stent into the intracranial portion with patency maintained of the positioned mid basilar artery stent. Control arteriograms were then performed at 15 and 25 minutes post deployment of the devices. These continued to demonstrate excellent apposition with flow extra cranially  and intracranially. The patient at this time was also given approximately 4 mg of intra-arterial Integrilin in order to prevent formation of platelet aggregation at the stented site. None was obvious on diagnostic catheter arteriograms. The exchange guidewire was then retrieved and removed. A final control arteriogram performed through the Benchmark catheter and just proximal to the right vertebral artery origin continued to demonstrate excellent flow through the proximal stent and extra cranially and intracranially. There continued to be excellent flow through the basilar artery into the posterior cerebral arteries, the superior cerebellar arteries with faint opacification of both anterior-inferior cerebellar arteries. The Benchmark guide sheath was removed. The radial sheath was then removed with successful hemostasis with a wrist band at the right radial puncture site. Distal right radial pulse was verified to be present. A CT of the brain performed on the table demonstrated no evidence of hemorrhage or hydrocephalus. The patient was then extubated. Upon recovery, the patient was able to converse appropriately. He denied any nausea, vomiting or headaches. He was able to move all 4 extremities spontaneously and to command. His pupils were 2 mm equal and sluggish. No change was seen in the right facial droop. The tongue remained in the midline. The patient was then returned to his room in the ICU in stable condition to  continue on low-dose IV heparin and 81 aspirin daily, and Brilinta 90 mg b.i.d. Early in the evening, the patient was more awake, alert and appropriately responsive. He continued to have dysarthria with right facial droop. Neurological examination continued to have non sustained nystagmus on right lateral gaze and to a lesser degree left lateral gaze with oscillopsia on the right lateral gaze unchanged. 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 PLAN: Follow-up in clinic 2 weeks post discharge. Electronically Signed   By: Luanne Bras M.D.   On: 09/02/2021 05:39   IR ANGIO VERTEBRAL SEL SUBCLAVIAN INNOMINATE UNI R MOD SED  Result Date: 09/02/2021 CLINICAL DATA:  History of progressive onset of dysarthria, nausea, vomiting and left-sided numbness. Patient also developed diplopia. 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 EXAM: INTRACRANIAL STENT (INCL PTA) COMPARISON:  CT angiogram of the head and neck of August 29, 2021, and MRI of the brain of August 29, 2021. MEDICATIONS: Heparin 4,000 units IV. Ancef 2 g IV antibiotic was administered within 1 hour of the procedure. ANESTHESIA/SEDATION: Mac anesthesia as per Department of Anesthesiology for diagnostic portion followed by general anesthesia for endovascular treatment portion. CONTRAST:  Omnipaque 300 approximately 150 mL. FLUOROSCOPY TIME:  Fluoroscopy Time: 41 minutes 18 seconds (2310 mGy). COMPLICATIONS: None immediate. TECHNIQUE: Informed written consent was obtained from the patient after a thorough discussion of the procedural risks, benefits and  alternatives. All questions were addressed. Maximal Sterile Barrier Technique was utilized including caps, mask, sterile gowns, sterile gloves, sterile drape, hand hygiene and skin antiseptic. A timeout was performed prior to the initiation of the procedure. The right forearm to the wrist was prepped and draped in the usual sterile manner. The right radial artery was then identified with ultrasound and its morphology documented. A dorsal palmar anastomosis was verified to be present. Using ultrasound guidance, and a micropuncture set access into the right radial  artery was obtained with a 6/7 French radial sheath. The obturator, and the guidewire were removed. Good aspiration was obtained from the side port of the sheath. Cocktail of 2000 units of heparin, 2.5 mg of verapamil, and 200 mcg of nitroglycerin was then infused through the sheath in diluted form without event. A right radial arteriogram was performed. Over a 0.035 inch Roadrunner guidewire, a 5 [place] Simmons 2 diagnostic catheter was advanced to the aortic arch region, and arteriograms were performed of the right common carotid artery, the left common carotid artery, the left vertebral artery, and the right subclavian artery at the origin of stump of the occluded right vertebral artery. FINDINGS: The diagnostic study was somewhat marred by persistent motion artifact. The left common carotid arteriogram demonstrates the left external carotid artery and its major branches to be widely patent. The left internal carotid artery at the bulb demonstrates approximately 50% stenosis without evidence of intraluminal filling defects. More distally, the left internal carotid artery is seen to opacify to the cranial skull base. 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 A left posterior communicating artery is seen opacifying the left posterior cerebral  artery distribution. The left middle cerebral artery and the left anterior cerebral artery opacify into the capillary and venous phases. Non dominant left vertebral artery demonstrates patency at its origin with brisk ascent to the cranial skull base to supply primarily the ipsilateral left posteroinferior cerebellar artery. The right common carotid arteriogram demonstrates the right external carotid artery and its major branches to be widely patent. The right internal carotid artery at the bulb has approximately 90% stenosis due to a circumferential smooth plaque. More distally, the vessel is seen to opacify to the cranial skull base. The petrous, the cavernous and the supraclinoid segments are widely patent. A right posterior communicating artery is seen opacifying the right posterior cerebral artery distribution. The right middle and the right anterior cerebral arteries opacify into the capillary and venous phases. The right subclavian arteriogram at the origin of the occluded right vertebral artery demonstrates partial reconstitution of the right vertebral artery at the level of C3-C4 from collaterals arising from the cervical branch of the thyrocervical trunk with opacification noted more distally into the dominant right vertebrobasilar junction. Opacification is noted into the basilar artery, and retrogradely in the right vertebral artery to just above the site of the occlusion in the proximal right vertebral artery. ENDOVASCULAR REVASCULARIZATION OF OCCLUDED DOMINANT PROXIMAL RIGHT VERTEBRAL ARTERY WITH STENT ASSISTED ANGIOPLASTY, AND OF THE MID BASILAR ARTERY WITH STENT ASSISTED ANGIOPLASTY Over the exchange 035 inch 300 cm Rosen exchange guidewire, the combination of a 95 cm Benchmark guide catheter with a support 5.5 [place] Berenstein catheter was advanced and positioned just proximal to the stump of the occluded dominant right vertebral artery. The guidewire and the support catheter were gently retrieved  and removed. Good aspiration was obtained from the hub of the Benchmark guide catheter. Bilateral roadmap was then obtained centered over the vertebral artery origin and distally through the [facility] guide catheter. Over an 0.014 inch standard Synchro micro guidewire with a moderate J configuration, inside of an 021 160 cm Trevo Trak microcatheter combination was advanced to just proximal to the occluded dominant right vertebral artery. Using a torque device with gentle manipulation access was obtained with the micro guidewire through the occluded right vertebral artery without difficulty. The guidewire was advanced to the vertebral artery followed by  the microcatheter. The guidewire was removed. Good aspiration obtained from the hub of microcatheter. A control arteriogram was then performed through the microcatheter distally with opacification noted in the right vertebrobasilar junction, the basilar artery, the right posterior cerebral artery, the superior cerebellar arteries and the anterior-inferior cerebellar arteries. Also noted was significant narrowing secondary to an eccentric arteriosclerotic plaque in the mid basilar artery just proximal to the origin of right anterior inferior cerebellar artery. The microcatheter was then exchanged under constant fluoroscopic guidance for an 014 inch 300 cm Synchro standard micro guidewire with a moderate J configuration at its tip. Measurements were then performed of the most proximal portion of the right vertebral artery from the previous diagnostic arteriogram. It was decided to proceed with initial balloon angioplasty of the origin of the occluded right vertebral artery with a 2.5 mm x 15 mm Apex Monorail angioplasty balloon. This was prepped and purged antegradely with heparinized saline infusion, and retrogradely with 75% contrast and 25% heparinized saline infusion. Using rapid exchange technique, this was then advanced and positioned at the origin of the occluded  right vertebral artery with the proximal marker proximal to the origin of the right vertebral artery. Control inflation was then performed using micro inflation syringe device via micro tubing to approximately 8 atmospheres where it was maintained for approximately 15 seconds. The balloon was deflated and retrieved and removed. Control arteriogram performed through [facility] guide catheter demonstrated significantly improved caliber and flow through the angioplastied segment with copious flow noted intracranially. 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 Patency was noted of the posterior cerebral arteries, the superior cerebellar arteries and faintly the anterior-inferior cerebellar arteries bilaterally. Significantly improved caliber was also noted at the origin of the right vertebral artery. A 120 cm Phenom Plus catheter was then advanced over the exchange micro guidewire under fluoroscopic guidance and positioned in the distal right vertebral artery. The Benchmark catheter was now advanced without difficulty into the distal right vertebral artery also. A roadmap was then obtained. Using a torque device, the exchange micro guidewire was safely advanced under fluoroscopic guidance into the proximal basilar artery and then the distal basilar artery into the right posterior cerebral artery P1 segment. Measurements were then performed of the basilar artery distal and proximal to the prominent stenosis. It was decided to proceed with placement of a Resolute Onyx 3 mm x 15 mm balloon mounted stent intracranially. However, the stented stent delivery apparatus could not be advanced through the 045 Phenom Plus catheter. The Phenom Plus catheter was removed with the wire maintained in the right P1 segment. The Benchmark guide catheter was easily then advanced into the right vertebrobasilar junction just proximal to the basilar artery. The Resolute  Onyx which had been prepped with heparinized saline infusion retrogradely and angiographically with 60% contrast and 40% heparinized saline infusion was reintroduced and advanced without difficulty over the exchange wire to the distal end of the Benchmark guide catheter. This stent apparatus was easily advanced and positioned with the proximal and the distal markers adequate distant from the site of the significant mid basilar artery stenosis. The stent was then deployed by inflating balloon using a micro inflation syringe device via micro tubing. Balloon was expanded to approximately 2.9 mm. The balloon was then deflated and retrieved. A control arteriogram performed through the Benchmark guide catheter demonstrated excellent apposition and flow through the stented mid basilar artery. Patency was seen of the posterior cerebral arteries, the superior  cerebellar arteries and faintly the anterior-inferior cerebellar arteries. The Benchmark catheter was then retrieved more proximally while the exchange wire was retrieved into the V4 segment of the right vertebral artery. The Benchmark catheter was retrieved to just proximal to the origin of the right vertebral artery. Measurements were again performed of the vertebral artery at this site. It was decided to proceed with placement of a 3.5 mm x 18 mm Onyx Frontier balloon mountable stent. After having been prepped in the usual manner, this was then advanced and positioned such that the proximal portion of the device was just proximal to the origin of the right vertebral artery. Thereafter, controlled inflation was performed of the balloon mountable stent to approximately 8 atmospheres. This was maintained for approximately 10 seconds. The balloon was then deflated and retrieved and removed. A control arteriogram performed through the Benchmark guide catheter demonstrated excellent apposition and coverage of the previously occluded right vertebral artery origin and  proximal portion. Free flow was now noted through the proximal stent into the intracranial portion with patency maintained of the positioned mid basilar artery stent. Control arteriograms were then performed at 15 and 25 minutes post deployment of the devices. These continued to demonstrate excellent apposition with flow extra cranially and intracranially. The patient at this time was also given approximately 4 mg of intra-arterial Integrilin in order to prevent formation of platelet aggregation at the stented site. None was obvious on diagnostic catheter arteriograms. The exchange guidewire was then retrieved and removed. A final control arteriogram performed through the Benchmark catheter and just proximal to the right vertebral artery origin continued to demonstrate excellent flow through the proximal stent and extra cranially and intracranially. There continued to be excellent flow through the basilar artery into the posterior cerebral arteries, the superior cerebellar arteries with faint opacification of both anterior-inferior cerebellar arteries. The Benchmark guide sheath was removed. The radial sheath was then removed with successful hemostasis with a wrist band at the right radial puncture site. Distal right radial pulse was verified to be present. A CT of the brain performed on the table demonstrated no evidence of hemorrhage or hydrocephalus. The patient was then extubated. Upon recovery, the patient was able to converse appropriately. He denied any nausea, vomiting or headaches. He was able to move all 4 extremities spontaneously and to command. His pupils were 2 mm equal and sluggish. No change was seen in the right facial droop. The tongue remained in the midline. The patient was then returned to his room in the ICU in stable condition to continue on low-dose IV heparin and 81 aspirin daily, and Brilinta 90 mg b.i.d. Early in the evening, the patient was more awake, alert and appropriately responsive.  He continued to have dysarthria with right facial droop. Neurological examination continued to have non sustained nystagmus on right lateral gaze and to a lesser degree left lateral gaze with oscillopsia on the right lateral gaze unchanged. 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 PLAN: Follow-up in clinic 2 weeks post discharge. Electronically Signed   By: Luanne Bras M.D.   On: 09/02/2021 05:39      HISTORY OF PRESENT ILLNESS   [name] presents as code stroke via EMS when he experienced left sided tingling prior to arrival, 2 hours prior to presentation. His troubles began between 10/31-11/1, when  he noted sudden onset dizziness and headache. He laid on the ground and these eventually subsided over the course of an hour, although they returned this AM.    2 hours prior to arrival, he had been having a bowel movement when he suddenly felt left sided tingling "like neuropathy" of left face, arm, leg. Has been constant since onset without change in character. No weakness.    History is obtained from:patient and his wife.    No personal or FMHx of stroke.    After brief exam at ED bridge, patient taken emergently to CT suite.  CTH  reveals subacute appearing right cerebellar infarcts.  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 RT rad approach. Wrist band applied for hemostasis. Post CT NO ICH or hydrocephalus. 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    Resultant right peripheral facial droop (CN VII), right lateral gaze incomplete (CN VI), right hearing loss (CN VIII and AICA territory), left sided numbness (right spinal lemniscus and right trigeminothalamic tract), right cerebellum (right PICA and AICA) CT showed right cerebellar infarct CT head and neck right VA origin occlusion, reconstituted at distal V2.  Right PICA occlusion, mid basilar artery high-grade stenosis versus thrombosis, right ICA 65% stenosis, left VA ends at PICA MRI right cerebellar infarct, and right lower pontine infarct. IR right VA origin, mid to basilar artery prominent stenosis, status post stenting in both arteries.  Severe 90% stenosis right ICA proximal, 50% stenosis left ICA proximal. MRI repeat 11/5 demonstrates increased size of right cerebellar infarct, new patchy right cerebellar infarcts in AICA territors, new infarcts in right middle cerebellar peduncle, right medulla, right and left pons, new punctate infarcts in left parietal and occipital lobes. 2D Echo EF 60 to 65% TEE performed 11/4, LVEF 60-65%No LA/LAAthrombus or mass.+PFO with R-->L shuntingAtherosclerosis of the descending aorta. Recommend 30 day cardiac event monitoring as outpt to rule out afib LDL 220 HgbA1c 5.5 SCDs for DVT prophylaxis No antithrombotic prior to admission, now on aspirin 81 mg daily, Brilinta (ticagrelor) 90 mg bid, Patient counseled to be compliant with his antithrombotic medications Ongoing aggressive stroke risk factor management Therapy recommendations: CIR Disposition: Pending   Carotid stenosis, bilateral CTA head and neck showed right ICA 65% stenosis Cerebral angiogram showed right ICA proximal 90% stenosis, left ICA proximal 50% stenosis Dr. Estanislado Pandy plan for right ICA stenting in the near future likely in 6 weeks after rehab stay.   Hypertension Unstable BP goal  relax to < 160 Cleviprex off On amlodipine and hydralazine  Long term BP goal normotensive   Hyperlipidemia Home meds: None LDL 220, goal < 70 Now on Lipitor 80 Continue statin at discharge   Other Stroke Risk Factors THC abuse, cessation education provided   Other Active Problems Urinary retention             - foley placed             - on Flomax Hypokalemia, K 3.1 -> 3.9->3.1              Replace K. Recheck BMP in am  Leukocytosis WBC 12.2-> 13.6-> 10.8->13.6.              -  afebrile will monitor   Patient developed transient dizziness and blurred vision likely due to orthostasis.  CT scan of the head shows expected evolutionary changes in the right cerebellum and brainstem infarct except for a new hypodensity in the medulla which is of unclear significance and there is no clinical correlation of this with any new neurological symptoms of stroke in this area.  Recommend mobilize out of bed as tolerated and continue ongoing therapies.  Continue dual antiplatelet therapy and aggressive risk factor modification.  No further stroke work-up is necessary.      DISCHARGE EXAM Blood pressure 125/88, pulse 92, temperature 98.7 F (37.1 C), temperature source Oral, resp. rate 18, height 6' (1.829 m), SpO2 100 %.  NEURO:  Mental Status: AA&Ox3. Can hold a conversation. Speech/Language: mild dysarthria  Naming, repetition, fluency, and comprehension intact.   Cranial Nerves:  II: PERRL. Diplopia persists esp on right lateral gaze. There is nystagmus noted on right lateral gaze. Right INO III, IV, VI: EOMI. Eyelids elevate symmetrically.  V: Patient c/o numbness to face bilaterally but can perceive light touch  VII: Right facial droop present VIII: hearing intact to voice on left, right sided hearing loss persists to finger rub. IX, X: Phonation is normal.  XII: without fasciculations. Speech is dysarthric but intelligible.   Motor: LUE/ LLE 5/5, RUE and RLE 4/5 Tone: is normal and  bulk is normal Sensation- Intact to light touch on right, left sided hemibody numbness persists.  Coordination: Mild FTN intact on left   HKS: no ataxia in LE.No drift.  Gait- deferred  Discharge Diet       Diet   Diet Heart Room service appropriate? Yes; Fluid consistency: Thin   liquids  DISCHARGE PLAN Disposition:  rehab  aspirin 81 mg daily and Brilinta (ticagrelor) 90 mg bid for secondary stroke prevention for 3 months then aspirin alone. Plan for RICA stenting in the near future likely in six weeks after rehab stay.  Ongoing stroke risk factor control by Primary Care Physician at time of discharge Follow-up PCP Hermina Staggers, MD in 2 weeks. Follow-up in [facility] in 8 weeks, office to schedule an appointment.    45 minutes were spent preparing discharge. I have personally obtained history,examined this patient, reviewed notes, independently viewed imaging studies, participated in medical decision making and plan of care.ROS completed by me personally and pertinent positives fully documented  I have made any additions or clarifications directly to the above note. Agree with note above.    Antony Contras, MD Medical Director [facility] Pager: [phone] 09/07/2021 1:59 PM

## 2021-09-07 NOTE — Plan of Care (Signed)
Nutrition Education Note  RD consulted for nutrition education regarding a Heart Healthy diet.  Pt and his wife had questions about how to eat after discharge. He reports not eating much throughout the day and typically only eating dinner. During admission he has been eating really well. He enjoys double Sport and exercise psychologist from [employer] with fries and a sweet tea, Venti Cafe Latte from [employer], filet mignon and buttermilk. Encouraged pt to try to eat smaller meals consistently throughout the day.  Lipid Panel     Component Value Date/Time   CHOL 288 (H) 08/29/2021 2340   TRIG 82 08/29/2021 2340   HDL 52 08/29/2021 2340   CHOLHDL 5.5 08/29/2021 2340   VLDL 16 08/29/2021 2340   LDLCALC 220 (H) 08/29/2021 2340    RD provided "Heart Healthy Nutrition Therapy" handout from the Academy of [employer]. Reviewed patient's dietary recall. Provided examples on ways to decrease sodium and fat intake in diet such as asking for no salt fries, switching to a single cheeseburger instead of double, opting for non-fat milk in coffee, and choosing leaner cuts of meat. Discouraged intake of processed foods and use of salt shaker. Encouraged fresh fruits and vegetables as well as whole grain sources of carbohydrates to maximize fiber intake. Teach back method used.  Expect good compliance.  Body mass index is 27.51 kg/m.   Current diet order is heart healthy, patient is consuming approximately 75-100% of meals at this time. Labs and medications reviewed. No further nutrition interventions warranted at this time. RD contact information provided. If additional nutrition issues arise, please re-consult RD.  Drusilla Kanner, RDN, LDN Clinical Nutrition

## 2021-09-07 NOTE — Progress Notes (Signed)
PROGRESS NOTE   Subjective/Complaints:   Pt reports no change or worsening of overall Sx's- still cannot hear R ear, "freight train in head". But much better with foley in place-  BP dropped again- felt dizzy/lightheaded.   K+ 4.8 and WBC down to 7.6 CXR looked great.  ROS:  Pt denies SOB, abd pain, CP, N/V/C/D, and vision changes    Objective:   DG Chest 2 View  Result Date: 09/06/2021 CLINICAL DATA:  Leukocytosis EXAM: CHEST - 2 VIEW COMPARISON:  None. FINDINGS: Heart size and mediastinal contours are within normal limits. No suspicious pulmonary opacities identified. No pleural effusion or pneumothorax visualized. No acute osseous abnormality appreciated. IMPRESSION: No acute intrathoracic process identified. Electronically Signed   By: Jannifer Hick M.D.   On: 09/06/2021 09:31   CT HEAD WO CONTRAST ( )  Result Date: 09/05/2021 CLINICAL DATA:  Stroke, follow-up, new vision changes EXAM: CT HEAD WITHOUT CONTRAST TECHNIQUE: Contiguous axial images were obtained from the base of the skull through the vertex without intravenous contrast. COMPARISON:  08/29/2021 CT head, correlation is also made with 09/01/2021 MRI brain FINDINGS: Brain: Increased hypodensity in the right cerebellar hemisphere and pons compared to 08/29/2021, which largely correlates with the restricted diffusion on the 09/01/2021 exam. Additional focal area of hypodensity in the medulla (series 3, image 5 and series 6, image 31) does not correspond to an area of restricted diffusion on the prior MRI. No hemorrhage, mass, mass effect, or midline shift. Vascular: Interval stenting of the basilar artery. Skull: No acute osseous abnormality. Sinuses/Orbits: Mild mucosal thickening in the maxillary sinuses. Other: Trace fluid in right mastoid air cells. IMPRESSION: 1. Increased hypodensity in the right cerebellar hemisphere and pons, which largely correlates with an  area of restricted diffusion, with an additional focal area of hypodensity in the medulla, which does not correspond to an area of restricted diffusion on the prior MRI and is concerning for additional area of infarction. 2. Interval stenting of the basilar artery. These results were called by telephone at the time of interpretation on 09/05/2021 at 5:47 pm to provider PAMELA LOVE , who verbally acknowledged these results. Electronically Signed   By: Wiliam Ke M.D.   On: 09/05/2021 17:48   Recent Labs    09/06/21 0830 09/07/21 0543  WBC 9.1 7.6  HGB 13.8 13.7  HCT 40.7 41.8  PLT 290 296   Recent Labs    09/06/21 0830 09/07/21 0543  NA 136 134*  K 4.1 4.8  CL 105 102  CO2 22 24  GLUCOSE 119* 116*  BUN 14 15  CREATININE 0.83 0.87  CALCIUM 9.0 9.0    Intake/Output Summary (Last 24 hours) at 09/07/2021 1610 Last data filed at 09/06/2021 2341 Gross per 24 hour  Intake 1121.21 ml  Output 3329 ml  Net -2207.79 ml        Physical Exam: Vital Signs Blood pressure 125/88, pulse 92, temperature 98.7 F (37.1 C), temperature source Oral, resp. rate 18, height 6' (1.829 m), SpO2 100 %.      General: awake, alert, appropriate, sitting up in bed eating/wife feeding him; NAD HENT: dysconjugate gaze; oropharynx moist; tape on L eyeglass lens-  said things less blurry CV: regular rate; no JVD Pulmonary: CTA B/L; no W/R/R- good air movement GI: soft, NT, ND, (+)BS; normoactive Psychiatric: appropriate- brighter, sing song voice Neurological: Ox3  Musculoskeletal:     Cervical back: Normal range of motion. No rigidity.     Comments: 5/5 in RUE/RLE/LUE/LLE  Skin:    Comments: R hand IV- look OK No skin breakdown seen  Neurological:     Mental Status: He is alert and oriented to person, place, and time.     Comments: Right facial weakness with flat,dysarthric speech. Diplopia worse on right with horizontal nystagmus--tape on left lens helping with correction. Ataxia with finger  to nose Left>Right as well as decrease in LLE motor control. Sensory deficits left face, LUE and LLE.  Very mild slowed processing/but could be due to dysarthria.   Assessment/Plan: 1. Functional deficits which require 3+ hours per day of interdisciplinary therapy in a comprehensive inpatient rehab setting. Physiatrist is providing close team supervision and 24 hour management of active medical problems listed below. Physiatrist and rehab team continue to assess barriers to discharge/monitor patient progress toward functional and medical goals  Care Tool:  Bathing    Body parts bathed by patient: Right arm, Left arm, Chest, Abdomen, Right upper leg, Left upper leg, Right lower leg, Left lower leg, Face   Body parts bathed by helper: Front perineal area, Buttocks     Bathing assist Assist Level: Maximal Assistance - Patient 24 - 49%     Upper Body Dressing/Undressing Upper body dressing   What is the patient wearing?: Pull over shirt    Upper body assist Assist Level: Minimal Assistance - Patient > 75%    Lower Body Dressing/Undressing Lower body dressing      What is the patient wearing?: Pants, Incontinence brief     Lower body assist Assist for lower body dressing: Maximal Assistance - Patient 25 - 49%     Toileting Toileting Toileting Activity did not occur (Clothing management and hygiene only): N/A (no void or bm) (foley in place)  Toileting assist Assist for toileting: Maximal Assistance - Patient 25 - 49%     Transfers Chair/bed transfer  Transfers assist     Chair/bed transfer assist level: Maximal Assistance - Patient 25 - 49%     Locomotion Ambulation   Ambulation assist   Ambulation activity did not occur: Safety/medical concerns (requires +2 and skilled assist with use of hallway rail)          Walk 10 feet activity   Assist  Walk 10 feet activity did not occur: Safety/medical concerns        Walk 50 feet activity   Assist Walk  50 feet with 2 turns activity did not occur: Safety/medical concerns         Walk 150 feet activity   Assist Walk 150 feet activity did not occur: Safety/medical concerns         Walk 10 feet on uneven surface  activity   Assist Walk 10 feet on uneven surfaces activity did not occur: Safety/medical concerns         Wheelchair     Assist Is the patient using a wheelchair?: Yes   Wheelchair activity did not occur: N/A         Wheelchair 50 feet with 2 turns activity    Assist            Wheelchair 150 feet activity     Assist  Blood pressure 125/88, pulse 92, temperature 98.7 F (37.1 C), temperature source Oral, resp. rate 18, height 6' (1.829 m), SpO2 100 %.    Medical Problem List and Plan: 1.  R cerebellar stroke and L parietal and occipital stroke with Sensory issues and balance impairment- due to stroke             -patient may  shower             -ELOS/Goals: 16-18 days- min A  -first day of PT/OT and SLP- con't therapy- but this afternoon put on bedrest due to concern for new stroke- CT looks (+)- Dr Pearlean Brownie called-   11/10- bedrest until seen by Neuro- but once cleared, con't PT/OT and SLP  11/11- pt cleared for therapy by Neuro- con't PT/OT/SLP- low BP is biggest limiter- will address 2.  Antithrombotics: -DVT/anticoagulation:  Pharmaceutical: Lovenox added.              -antiplatelet therapy: ASA/Brilinta. 3. Pain Management: Tylenol prn for HA.  4. Mood: LCSW to follow for evaluation and support.              -antipsychotic agents: N/A 5. Neuropsych: This patient is capable of making decisions on his own behalf. 6. Skin/Wound Care: Routine pressure-relief measures. 7. Fluids/Electrolytes/Nutrition: Monitor I's/O.  Check CMet in AM 8.  HTN with new hypotension/orthostaitc: Monitor BP TID.  Orthostatic symptoms reported today. --On hydralazine 25 mg 3 times daily, Cozaar 50 mg twice daily and amlodipine 10 mg  daily.  11/9- orthostatic today- decreased losartan to 25 mg BID and decreased Norvasc to 5 mg daily- will add TEDs and ACE wraps and see how pt does.  --We will check orthostatic vital signs. 11/10- BP 149/80s- con't regimen 11/11- BP drops to 70s/50s per pt/wife and dizzy- will decrease hydralazine to 10 mg TID (BP is running in bed 150s/160s/systolic) will try Midodrine 2.5 mg with breakfast/lunch- advised to sit up after midodrine for 4 hours.  9.  Urinary retention: Likely exacerbated by constipation. Augment bowel program and remove foley in am.  --Continue Flomax--changed to bedtime to avoid orthostatic symptoms. 11/9- increased Flomax to 0.8 mg nightly, however will check U/A and Cx due to elevated WBC and retention. 11/10-- U/A (-)- likely retention due to stroke- explained up to 30-40% of stroke patients have retention/incontinence  11/11- foley placed per pt request- since so painful- much happier- explained increases risk of UTI 10. Constipation: Will start patient on Senna S daily.  --MOM today.   11/9- didn't poop- give sorbitol 60cc x1 this afternoon  11/10- had multiple BMs 11.  Leukocytosis: Monitor for fevers and other signs of infection.  11/9- check U/A and Cx- since more elevated- afebrile.   11/10- WBC down to 9k- checking CXR just in case.  12.  Intermittent hypokalemia: Recheck CMET/Mg level in am.    --May need standing dose supplement is on hydralazine. 11/11- K+ 4.8- could be  hemolysis vs supplement-  13.  Hyperglycemia: Likely stress related as Hemoglobin A1c 5.5. 14.  Dyslipidemia: Continue Lipitor. 15. 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     LOS: 3 days A FACE TO FACE EVALUATION WAS PERFORMED  Mikyla Schachter 09/07/2021, 8:52 AM

## 2021-09-07 NOTE — Discharge Summary (Deleted)
  The note originally documented on this encounter has been moved the the encounter in which it belongs.  

## 2021-09-08 ENCOUNTER — Inpatient Hospital Stay (HOSPITAL_COMMUNITY): Payer: 59

## 2021-09-08 DIAGNOSIS — I639 Cerebral infarction, unspecified: Secondary | ICD-10-CM | POA: Diagnosis not present

## 2021-09-08 LAB — CBC
HCT: 39.2 % (ref 39.0–52.0)
Hemoglobin: 12.9 g/dL — ABNORMAL LOW (ref 13.0–17.0)
MCH: 31.2 pg (ref 26.0–34.0)
MCHC: 32.9 g/dL (ref 30.0–36.0)
MCV: 94.9 fL (ref 80.0–100.0)
Platelets: 289 10*3/uL (ref 150–400)
RBC: 4.13 MIL/uL — ABNORMAL LOW (ref 4.22–5.81)
RDW: 13.4 % (ref 11.5–15.5)
WBC: 8.3 10*3/uL (ref 4.0–10.5)
nRBC: 0 % (ref 0.0–0.2)

## 2021-09-08 LAB — BASIC METABOLIC PANEL
Anion gap: 7 (ref 5–15)
BUN: 16 mg/dL (ref 8–23)
CO2: 25 mmol/L (ref 22–32)
Calcium: 9 mg/dL (ref 8.9–10.3)
Chloride: 102 mmol/L (ref 98–111)
Creatinine, Ser: 0.93 mg/dL (ref 0.61–1.24)
GFR, Estimated: >60 mL/min (ref >60)
Glucose, Bld: 110 mg/dL — ABNORMAL HIGH (ref 70–99)
Potassium: 4.4 mmol/L (ref 3.5–5.1)
Sodium: 134 mmol/L — ABNORMAL LOW (ref 135–145)

## 2021-09-08 LAB — GLUCOSE, CAPILLARY: Glucose-Capillary: 112 mg/dL — ABNORMAL HIGH (ref 70–99)

## 2021-09-08 MED ORDER — IOPAMIDOL (ISOVUE-370) INJECTION 76%
75.0000 mL | Freq: Once | INTRAVENOUS | Status: AC | PRN
  Administered 2021-09-08: 75 mL via INTRAVENOUS

## 2021-09-08 NOTE — Progress Notes (Signed)
Occupational Therapy Session Note  Patient Details  Name: Maurice Little MRN: [id number] Date of Birth: Feb 26, 1956  Today's Date: 09/08/2021 OT Individual Time: 5400-8676 OT Individual Time Calculation (min): 54 min    Short Term Goals: Week 1:  OT Short Term Goal 1 (Week 1): Pt will maintain dynamic sitting balance at min guard assist during selfcare tasks sitting unsupported. OT Short Term Goal 2 (Week 1): Pt will complete LB bathing at min assist level sit to stand for two consecutive sessions,. OT Short Term Goal 3 (Week 1): Pt will complete LB dressing sit to stand with mod assist for two consecutive sessions. OT Short Term Goal 4 (Week 1): Pt will complete toilet transfers with min assist sit to stand.  Skilled Therapeutic Interventions/Progress Updates:    Pt resting in bed upon arrival. RN and wife at bedside. Pt reports dizziness of 3/10. Wife educated on donning Ted hose using plastic bag. Abdominal binder donned with pt sitting in bed. Supine>sit EOB with min A for LOBx1. Sitting balance EOB with occasional min A for LOB to Rt. Pt able to cross LE to don tennis shoes with min A for sitting balance during task. Sit<>stand in [place] with CGA X 3. Pt c/o increase in dizziness to 7/10 but resolved to 5/10 while standing. During last sit<>stand/standing in [place] pt reported increase in lightheadedness. Pt returned to sitting EOB. Sit>supine with CGA.   BP (ted hose and abd binder: Supine with HOB elevated-128/86 HR 92 Seated EOB-130/97 HR 110, 119/84 Standing in [place]: 116/91 HR 110 and 110/61 HR 128 Pt reported lightheadedness X 1 with last styand in [place].  Pt remained in bed with all needs within reach. Bed alarm activated. Wife at bedside.  Therapy Documentation Precautions:  Precautions Precautions: Other (comment), Fall Precaution Comments: diplopia, Ataxic R; L side numbness Restrictions Weight Bearing Restrictions: No Pain:  Pt c/o 3/10 HA; meds admin at beginning of  session   Therapy/Group: Individual Therapy  Rich Brave 09/08/2021, 8:58 AM

## 2021-09-08 NOTE — Progress Notes (Signed)
PROGRESS NOTE   Subjective/Complaints:   BP dropped again to 100 SBP  Esp when getting shower- Was very dizzy/lightheaded.   Has been drinking 4 cups/day- has abd binder and TEDs.    ROS:  Pt denies SOB, abd pain, CP, N/V/C/D, and vision changes    Objective:   No results found. Recent Labs    09/07/21 0543 09/08/21 0533  WBC 7.6 8.3  HGB 13.7 12.9*  HCT 41.8 39.2  PLT 296 289   Recent Labs    09/07/21 0543 09/08/21 0533  NA 134* 134*  K 4.8 4.4  CL 102 102  CO2 24 25  GLUCOSE 116* 110*  BUN 15 16  CREATININE 0.87 0.93  CALCIUM 9.0 9.0    Intake/Output Summary (Last 24 hours) at 09/08/2021 1026 Last data filed at 09/08/2021 0015 Gross per 24 hour  Intake 240 ml  Output 2025 ml  Net -1785 ml        Physical Exam: Vital Signs Blood pressure 112/67, pulse 80, temperature 98.2 F (36.8 C), temperature source Oral, resp. rate 18, height 6' (1.829 m), SpO2 97 %.       General: awake, alert, appropriate,  sitting up in bed; wife at bedside; NAD HENT: dysconjugate gaze; oropharynx moist- L lens covered with tape CV: regular rate; no JVD Pulmonary: CTA B/L; no W/R/R- good air movement GI: soft, NT, ND, (+)BS Psychiatric: appropriate Neurological: Ox3  Musculoskeletal:     Cervical back: Normal range of motion. No rigidity.     Comments: 5/5 in RUE/RLE/LUE/LLE  Skin:    Comments: R hand IV- look OK No skin breakdown seen  Neurological:     Mental Status: He is alert and oriented to person, place, and time.     Comments: Right facial weakness with flat,dysarthric speech. Diplopia worse on right with horizontal nystagmus--tape on left lens helping with correction. Ataxia with finger to nose Left>Right as well as decrease in LLE motor control. Sensory deficits left face, LUE and LLE.  Very mild slowed processing/but could be due to dysarthria.   Assessment/Plan: 1. Functional deficits  which require 3+ hours per day of interdisciplinary therapy in a comprehensive inpatient rehab setting. Physiatrist is providing close team supervision and 24 hour management of active medical problems listed below. Physiatrist and rehab team continue to assess barriers to discharge/monitor patient progress toward functional and medical goals  Care Tool:  Bathing    Body parts bathed by patient: Right arm, Left arm, Chest, Abdomen, Right upper leg, Left upper leg, Right lower leg, Left lower leg, Face, Front perineal area, Buttocks   Body parts bathed by helper: Front perineal area, Buttocks     Bathing assist Assist Level: Minimal Assistance - Patient > 75%     Upper Body Dressing/Undressing Upper body dressing   What is the patient wearing?: Pull over shirt    Upper body assist Assist Level: Supervision/Verbal cueing    Lower Body Dressing/Undressing Lower body dressing      What is the patient wearing?: Pants, Incontinence brief     Lower body assist Assist for lower body dressing: Moderate Assistance - Patient 50 - 74%     Toileting  Toileting Toileting Activity did not occur Press photographer and hygiene only): N/A (no void or bm) (foley in place)  Toileting assist Assist for toileting: Maximal Assistance - Patient 25 - 49%     Transfers Chair/bed transfer  Transfers assist     Chair/bed transfer assist level: Maximal Assistance - Patient 25 - 49%     Locomotion Ambulation   Ambulation assist   Ambulation activity did not occur: Safety/medical concerns (requires +2 and skilled assist with use of hallway rail)          Walk 10 feet activity   Assist  Walk 10 feet activity did not occur: Safety/medical concerns        Walk 50 feet activity   Assist Walk 50 feet with 2 turns activity did not occur: Safety/medical concerns         Walk 150 feet activity   Assist Walk 150 feet activity did not occur: Safety/medical concerns          Walk 10 feet on uneven surface  activity   Assist Walk 10 feet on uneven surfaces activity did not occur: Safety/medical concerns         Wheelchair     Assist Is the patient using a wheelchair?: Yes   Wheelchair activity did not occur: Safety/medical concerns         Wheelchair 50 feet with 2 turns activity    Assist            Wheelchair 150 feet activity     Assist          Blood pressure 112/67, pulse 80, temperature 98.2 F (36.8 C), temperature source Oral, resp. rate 18, height 6' (1.829 m), SpO2 97 %.    Medical Problem List and Plan: 1.  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  Antithrombotics: -DVT/anticoagulation:  Pharmaceutical: Lovenox added.              -antiplatelet therapy: ASA/Brilinta. 3. Pain Management: Tylenol prn for HA.  4. Mood: LCSW to follow for evaluation and support.              -antipsychotic agents: N/A 5. Neuropsych: This patient is capable of making decisions on his own behalf. 6. Skin/Wound Care: Routine pressure-relief measures. 7. Fluids/Electrolytes/Nutrition: Monitor I's/O.  Check CMet in AM 8.  HTN with new hypotension/orthostaitc: Monitor BP TID.  Orthostatic symptoms reported today. --On hydralazine 25 mg 3 times daily, Cozaar 50 mg twice daily and amlodipine 10 mg daily.  11/9- orthostatic today- decreased losartan to 25 mg BID and decreased Norvasc to 5 mg daily- will add TEDs and ACE wraps and see how pt does.  --We will check orthostatic vital signs. 11/10- BP 149/80s-  con't regimen 11/11- BP drops to 70s/50s per pt/wife and dizzy- will decrease hydralazine to 10 mg TID (BP is running in bed 150s/160s/systolic) will try Midodrine 2.5 mg with breakfast/lunch- advised to sit  up after midodrine for 4 hours.  11/12- BP dropped to 100s- drink 8 cups/day- stop Losartan for now- con't abd binder/TEDs- con't rest of regimen 9.  Urinary retention: Likely exacerbated by constipation. Augment bowel program and remove foley in am.  --Continue Flomax--changed to bedtime to avoid orthostatic symptoms. 11/9- increased Flomax to 0.8 mg nightly, however will check U/A and Cx due to elevated WBC and retention. 11/10-- U/A (-)- likely retention due to stroke- explained up to 30-40% of stroke patients have retention/incontinence  11/11- foley placed per pt request- since so painful- much happier- explained increases risk of UTI 10. Constipation: Will start patient on Senna S daily.  --MOM today.   11/9- didn't poop- give sorbitol 60cc x1 this afternoon  11/10- had multiple BMs 11.  Leukocytosis: Monitor for fevers and other signs of infection.  11/9- check U/A and Cx- since more elevated- afebrile.   11/10- WBC down to 9k- checking CXR just in case.   11/12- CXR looks good! 12.  Intermittent hypokalemia: Recheck CMET/Mg level in am.    --May need standing dose supplement is on hydralazine. 11/11- K+ 4.8- could be  hemolysis vs supplement-  13.  Hyperglycemia: Likely stress related as Hemoglobin A1c 5.5. 14.  Dyslipidemia: Continue Lipitor. 15. 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     LOS: 4 days A FACE TO FACE EVALUATION WAS PERFORMED  Anelle Parlow 09/08/2021, 10:26 AM

## 2021-09-08 NOTE — Progress Notes (Signed)
Speech Language Pathology Daily Session Note  Patient Details  Name: Maurice Little MRN: [id number] Date of Birth: 10-Nov-1955  Today's Date: 09/08/2021 SLP Individual Time: 1000-1100 SLP Individual Time Calculation (min): 60 min  Short Term Goals: Week 1: SLP Short Term Goal 1 (Week 1): Patient will implement speech intelligiblity strategies at sentence level with min A verbal cues. SLP Short Term Goal 2 (Week 1): Patient will consume current diet with minimal-to-no overt s/sx of aspiration and sup A for implementation of swallowing safety SLP Short Term Goal 3 (Week 1): Patient will participate in further cognitive-linguistic evaluation to further identify/manage functional needs  Skilled Therapeutic Interventions: Pt received semi reclined in bed and agreeable to skilled ST intervention with focus on cognitive and speech goals. SLP administered the [facility] Mental Status Examination (SLUMS) and patient scored 24/26. Score was adjusted to omit writing portion (clock drawing) due to dominant hand deficits. Per SLUMS and further informal cognitive assessment, cognitive-linguistic skills appear grossly intact. SLP also facilitated speech intelligibility strategies using SLOP strategy (I.e., slow, loud, over-articulate, pause between words). Pt implemented at word, sentence, and conversation level with mod I-to-sup A and was perceived at 90-100% intelligible. Pt was very cautious and displayed functional self monitoring and self correction to enhance overall speech precision. Provided pt and spouse with multisyllabic word list for additional practice. Due to pt's blurred vision, spouse plans to provide assist by reading words to pt and having him repeat and produce at sentence level. Patient was left in bed with alarm activated and immediate needs within reach at end of session. Continue per current plan of care with emphasis on speech function and monitoring PO tolerance.    Pain Pain  Assessment Pain Scale: 0-10 Pain Score: 0-No pain  Therapy/Group: Individual Therapy  Tamala Ser 09/08/2021, 12:30 PM

## 2021-09-08 NOTE — Progress Notes (Signed)
Physical Therapy Session Note  Patient Details  Name: Maurice Little MRN: [id number] Date of Birth: 1956/01/25  Today's Date: 09/08/2021 PT Individual Time: 1110-1210 PT Individual Time Calculation (min): 60 min   Short Term Goals: Week 1:  PT Short Term Goal 1 (Week 1): Pt will perform supine<>sit with supervision PT Short Term Goal 2 (Week 1): Pt will perform sit<>stands with mod assist of 1 consistently PT Short Term Goal 3 (Week 1): Pt will perform bed<>chair transfers with mod assist of 1 consistently PT Short Term Goal 4 (Week 1): Pt will ambulate at least 66ft using LRAD with +2 mod assist  Skilled Therapeutic Interventions/Progress Updates:    Pt received sitting in bed HOB elevated to 55degrees with B LE thigh high TED hose and abdominal binder already donned. Pt's wife present and pt agreeable to therapy session. Vitals assessed as described below with notice pt's BP very labile and quick to increase with activity but also very quick to decrease when sitting statically - MD notified. Supine>sitting L EOB, HOB elevated and using bedrails as needed, with CGA for safety - has R anterior trunk lean once upright but able to recover without increased assist. R squat pivot EOB>TIS w/c with min/mod assist for pivoting hips - pt with quick, uncoordinated movements.  Transported to/from gym in w/c for time management and energy conservation. Participated in B UE exercise using UBE against level 7 resistance for x2 with break to assess vitals. Transported to day room and performed squat pivot TIS w/c<>Nustep with heavy min assist for pivoting hips. Participated in B LE reciprocal movement patterns on Nustep against level 5 resistance for 2 minutes totaling 96 steps then paused for vital sign assessment and due to pt reporting "dizzy"/"lightheaded" symptoms. Transported back to room and encouraged pt to return to bed but keep HOB elevated at 55degrees like it was before while keeping B LEs elevated and  TEDs/abdominal binder donned. Pt left in bed as just described with bed alarm on and his wife present.  In bed: BP 144/94 (MAP 106), HR 92bpm After 3 min on UBE: BP 111/82 (MAP 90), HR 981bpm, SpO2 100% reports 6/10 "dizzy"/"lightheaded"  After additional 3 min on UBE: BP 134/101 (MAP 113), HR 110bpm, SpO2 100%   Reassessed in R arm: BP 136/94 (MAP 108), HR 105bpm, SpO2 100%  Reassessed after 2-3 minutes: BP 129/99 (MAP 108), HR 97bpm, SpO2 99%   After transfer to Nustep: BP 122/88 (MAP 100), HR 107bpm, SpO2 100%  After 2 min on Nustep: BP 151/116 (MAP 128), HR 130bpm decreasing to 106bpm after 1.58min, SpO2 100%  - reports 7/10 "dizzy"/"lightheaded" with pt reporting 13/20 Borg RPE After transferring back to TIS w/c: BP 117/96 (MAP 104), HR 98bpm, SpO2 100%   At end of session in bed: BP 129/103 (MAP 113), HR 106bpm, SpO2 98%    Therapy Documentation Precautions:  Precautions Precautions: Other (comment), Fall Precaution Comments: diplopia, Ataxic R; L side numbness Restrictions Weight Bearing Restrictions: No   Pain:  No reports of pain throughout session.    Therapy/Group: Individual Therapy  Ginny Forth , PT, DPT, NCS, CSRS 09/08/2021, 11:07 AM

## 2021-09-08 NOTE — Consult Note (Signed)
NEUROLOGY CONSULTATION NOTE   Date of service: September 08, 2021 Patient Name: Maurice Little MRN:  [id number] DOB:  July 29, 1956 Reason for consult: "Stroke code for vision issues" Requesting Provider: Charlett Blake, MD _ _ _   _ __   _ __ _ _  __ __   _ __   __ _  History of Present Illness  Maurice Little is a 65 y.o. 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  He has been reporting intermittent dizziness and R hemivisual field blurred vision for the last couple days.  Reports that he wakes up fine in the morning and as he is participating and working with the therapy, he has transient R hemivisual field blurriness. He has had this yesterday and day before. Today, it seems a little worse.  He is unable to give na accurate LKW but reports that this started some time today but he had the same happen yesterday too.  A stroke code was activated with an inacurate LKW of 2055.  tNKase: not offered 2/2 recent strokes Thrombectomy: not offered due to no LVO. NIHSS components Score: Comment  1a Level of Conscious 0[x]  1[]  2[]  3[]      1b LOC Questions 0[x]  1[]  2[]       1c LOC Commands 0[x]  1[]  2[]       2 Best Gaze 0[x]  1[]  2[]       3 Visual 0[]  1[x]  2[]  3[]      4 Facial Palsy 0[]  1[x]  2[]  3[]      5a Motor Arm - left 0[x]  1[]  2[]  3[]  4[]  UN[]    5b Motor Arm - Right 0[x]  1[]  2[]  3[]  4[]  UN[]    6a Motor Leg - Left 0[x]  1[]  2[]  3[]  4[]  UN[]    6b Motor Leg - Right 0[x]  1[]  2[]  3[]  4[]  UN[]    7 Limb Ataxia 0[x]  1[]  2[]  3[]  UN[]     8 Sensory 0[]  1[x]  2[]  UN[]      9 Best Language 0[x]  1[]  2[]  3[]      10 Dysarthria 0[]  1[x]  2[]  UN[]      11 Extinct. and Inattention 0[x]  1[]  2[]       TOTAL: 4       Past History   Past Medical History:  Diagnosis Date  . Benign prostatic hyperplasia with elevated prostate specific antigen (PSA)   . Hypertension   . Iliac artery stenosis, left (HCC)    with claudication resolved with stent  . PAD (peripheral artery disease) ([place])    Past Surgical History:  Procedure Laterality Date  . BUBBLE STUDY  08/31/2021   Procedure: BUBBLE STUDY;  Surgeon: Skeet Latch, MD;  Location: [place];  Service: Cardiovascular;;  . ILIAC ARTERY STENT Left   . IR ANGIO VERTEBRAL SEL SUBCLAVIAN INNOMINATE UNI L MOD SED  08/30/2021  . IR ANGIO VERTEBRAL SEL SUBCLAVIAN INNOMINATE UNI R MOD SED  08/30/2021  . IR CT HEAD LTD  08/30/2021  . IR INTRA CRAN STENT  08/30/2021  . IR US GUIDE VASC ACCESS RIGHT  08/30/2021  . RADIOLOGY WITH ANESTHESIA N/A 08/30/2021   Procedure: IR WITH ANESTHESIA;  Surgeon: Luanne Bras, MD;  Location: [place];  Service: Radiology;  Laterality: N/A;  . TEE WITHOUT CARDIOVERSION N/A 08/31/2021   Procedure: TRANSESOPHAGEAL ECHOCARDIOGRAM (TEE);  Surgeon: Skeet Latch, MD;  Location: [facility] ENDOSCOPY;  Service: Cardiovascular;  Laterality: N/A;   Family History  Problem Relation Age of Onset  . Healthy Mother    Social History   Socioeconomic History  . Marital status: Married    Spouse name: Not on file  . Number of children: Not on file  . Years of education: Not on file  . Highest education level: Not on file  Occupational History  . Not on file  Tobacco Use  . Smoking status: Former    Types: Cigarettes  . Smokeless tobacco: Never  Substance and Sexual Activity  . Alcohol use: Not Currently  . Drug use: Not on file  . Sexual activity: Not on file  Other Topics Concern  . Not on file  Social History Narrative  . Not on file   Social Determinants of Health   Financial Resource Strain: Not on file  Food Insecurity: Not on file  Transportation Needs: Not on file  Physical Activity: Not on file  Stress:  Not on file  Social Connections: Not on file   Allergies  Allergen Reactions  . Duloxetine Tinitus    Other reaction(s): Other (See Comments) Dizziness Dizziness, tinnitus     Medications   Medications Prior to Admission  Medication Sig Dispense Refill Last Dose  . Acetylcysteine (NAC PO) Take 1 tablet by mouth daily.     Marland Kitchen aspirin EC 81 MG tablet Take 81 mg by mouth daily. Swallow whole.     . Cyanocobalamin (B-12 PO) Take 1 tablet by mouth daily.     Marland Kitchen ibuprofen (ADVIL) 200 MG tablet Take 400 mg by mouth every 6 (six) hours as needed for fever, headache or mild pain.     Marland Kitchen OVER THE COUNTER MEDICATION Take 1 tablet by mouth daily. Delta 3 gummie        Vitals   Vitals:   09/08/21 0318 09/08/21 0600 09/08/21 1347 09/08/21 1933  BP: 130/84 112/67 138/87 126/84  Pulse: 75 80 88 92  Resp: 18  17 16   Temp: 99.5 F (37.5 C) 98.2 F (36.8 C) 98 F (36.7 C) 98.2 F (36.8 C)  TempSrc: Oral Oral    SpO2: 97%  100% 100%  Height:         Body mass index is 27.51 kg/m.  Physical Exam   General: Laying comfortably in bed; in no acute distress.  HENT: Normal oropharynx and mucosa. Normal external appearance of ears and nose.  Neck: Supple, no pain or tenderness  CV: No JVD. No peripheral edema.  Pulmonary: Symmetric Chest rise. Normal respiratory effort.  Abdomen: Soft to touch, non-tender.  Ext: No cyanosis, edema, or deformity  Skin: No rash. Normal palpation of skin.   Musculoskeletal: Normal digits and nails by inspection. No clubbing.   Neurologic Examination  Mental status/Cognition: Alert, oriented to self, place, month and year, good attention.  Speech/language: Fluent, comprehension intact, object naming intact, repetition intact.  Cranial nerves:   CN II Pupils equal and reactive to light, VF intact.   CN III,IV,VI EOM intact, no gaze preference or deviation, + nystagmus, R INO.   CN V normal sensation in V1, V2, and V3 segments bilaterally    CN VII L facial  droop.   CN VIII normal hearing to speech    CN IX & X normal palatal elevation, no uvular deviation    CN XI 5/5 head turn and 5/5 shoulder shrug bilaterally    CN XII midline tongue  protrusion   Motor:  Muscle bulk: normal, tone normal. Mvmt Root Nerve  Muscle Right Left Comments  SA C5/6 Ax Deltoid 5 5   EF C5/6 Mc Biceps     EE C6/7/8 Rad Triceps     WF C6/7 Med FCR     WE C7/8 PIN ECU     F Ab C8/T1 U ADM/FDI 4+ 5   HF L1/2/3 Fem Illopsoas 4+ 5   KE L2/3/4 Fem Quad     DF L4/5 D Peron Tib Ant     PF S1/2 Tibial Grc/Sol      Reflexes:  Right Left Comments  Pectoralis      Biceps (C5/6) 2 2   Brachioradialis (C5/6) 2 2    Triceps (C6/7) 2 2    Patellar (L3/4) 2 2    Achilles (S1)      Hoffman      Plantar     Jaw jerk    Sensation:  Light touch Decreased mildly on L side to touch.   Pin prick    Temperature    Vibration   Proprioception    Coordination/Complex Motor:  - Finger to Nose with mild ataxia in LUE - Heel to shin unable to do - Rapid alternating movement are intact BL - Gait: deferred.  Labs   CBC:  Recent Labs  Lab 09/05/21 0511 09/06/21 0830 09/07/21 0543 09/08/21 0533  WBC 13.6* 9.1 7.6 8.3  NEUTROABS 9.6* 5.7  --   --   HGB 13.4 13.8 13.7 12.9*  HCT 40.0 40.7 41.8 39.2  MCV 95.9 94.0 95.7 94.9  PLT 278 290 296 289    Basic Metabolic Panel:  Lab Results  Component Value Date   NA 134 (L) 09/08/2021   K 4.4 09/08/2021   CO2 25 09/08/2021   GLUCOSE 110 (H) 09/08/2021   BUN 16 09/08/2021   CREATININE 0.93 09/08/2021   CALCIUM 9.0 09/08/2021   GFRNONAA >60 09/08/2021   Lipid Panel:  Lab Results  Component Value Date   LDLCALC 220 (H) 08/29/2021   HgbA1c:  Lab Results  Component Value Date   HGBA1C 5.5 08/29/2021   Urine Drug Screen:     Component Value Date/Time   LABOPIA NONE DETECTED 08/30/2021 0751   COCAINSCRNUR NONE DETECTED 08/30/2021 0751   LABBENZ NONE DETECTED 08/30/2021 0751   AMPHETMU NONE DETECTED  08/30/2021 0751   THCU POSITIVE (A) 08/30/2021 0751   LABBARB NONE DETECTED 08/30/2021 0751    Alcohol Level No results found for: [facility]  CT Head without contrast(Personally reviewed): R cerebellar and pontine which appears same as prior CTH. No hemorrhage.  CT angio Head and Neck with contrast: Basilar and right vertebral artery stents appear patent with good perfusion distally.  Impression   Maurice Little is a 65 y.o. male with PMH significant for recent right cerebellum and right lower pontine infarct due to right vertebral artery occlusion and high-grade basilar artery stenosis status post right vertebral artery and basilar artery stenting on home a code stroke was activated for worsening blurred vision on the right along with right-sided numbness.  He is not a candidate for TNKase due to recent strokes.  He is not a candidate for thrombectomy due to no LVO and his stents appear patent with good distal flow.  I suspect that the described blurred vision is actually more of a right INO/diplopia rather than visual field deficit. I think this is likely from the involvement of the Parapontine reticular formation. The reason  this is intermittent is probably because of the stroke and the post stroke edema in the pons. I would expect it to improve.   Impression: Right cerebellum stroke Pontine stroke Right INO likely due to involvement of the para pontine reticular formation. Basilar artery and vertebral artery stenosis status post stenting.  Recommendations  No further inpatient recommendations at this time. Neurology inpatient team will signoff. Please feel free to contact us with any questions or concerns. Please refer to last note from stroke team on 09/06/21 for full recommendations. ______________________________________________________________________  This patient is critically ill and at significant risk of neurological worsening, death and care requires constant monitoring of vital  signs, hemodynamics,respiratory and cardiac monitoring, neurological assessment, discussion with family, other specialists and medical decision making of high complexity. I spent 35 minutes of neurocritical care time  in the care of  this patient. This was time spent independent of any time provided by nurse practitioner or PA.  Donnetta Simpers [facility] Pager Number [phone] 09/09/2021  12:27 AM   Thank you for the opportunity to take part in the care of this patient. If you have any further questions, please contact the neurology consultation attending.  Signed,  [place] Pager Number [phone] _ _ _   _ __   _ __ _ _  __ __   _ __   __ _

## 2021-09-08 NOTE — Code Documentation (Addendum)
Responded to Code Stroke called at 2057 for R visual changes, LSN-2055. Per pt, he normally has blurry vision in his R eye, however, he has been having intermittent.episodes t/o the day where this has worsened. NIH-5 for L sensory deficit, R upper quadrantanopia, R facial droop, and slurred speech. CT head negative for acute changes, CT-no LVO. TNK not given-recent stroke. Plan-[facility] swallow screen prior to any PO intake, stroke workup.

## 2021-09-09 DIAGNOSIS — I639 Cerebral infarction, unspecified: Secondary | ICD-10-CM | POA: Diagnosis not present

## 2021-09-09 DIAGNOSIS — R339 Retention of urine, unspecified: Secondary | ICD-10-CM | POA: Diagnosis not present

## 2021-09-09 DIAGNOSIS — I951 Orthostatic hypotension: Secondary | ICD-10-CM | POA: Diagnosis not present

## 2021-09-09 DIAGNOSIS — K5901 Slow transit constipation: Secondary | ICD-10-CM

## 2021-09-09 LAB — CBC
HCT: 38.3 % — ABNORMAL LOW (ref 39.0–52.0)
Hemoglobin: 12.8 g/dL — ABNORMAL LOW (ref 13.0–17.0)
MCH: 31.8 pg (ref 26.0–34.0)
MCHC: 33.4 g/dL (ref 30.0–36.0)
MCV: 95 fL (ref 80.0–100.0)
Platelets: 286 10*3/uL (ref 150–400)
RBC: 4.03 MIL/uL — ABNORMAL LOW (ref 4.22–5.81)
RDW: 13.3 % (ref 11.5–15.5)
WBC: 7.4 10*3/uL (ref 4.0–10.5)
nRBC: 0 % (ref 0.0–0.2)

## 2021-09-09 LAB — BASIC METABOLIC PANEL
Anion gap: 9 (ref 5–15)
BUN: 12 mg/dL (ref 8–23)
CO2: 22 mmol/L (ref 22–32)
Calcium: 8.7 mg/dL — ABNORMAL LOW (ref 8.9–10.3)
Chloride: 100 mmol/L (ref 98–111)
Creatinine, Ser: 0.82 mg/dL (ref 0.61–1.24)
GFR, Estimated: >60 mL/min (ref >60)
Glucose, Bld: 109 mg/dL — ABNORMAL HIGH (ref 70–99)
Potassium: 3.8 mmol/L (ref 3.5–5.1)
Sodium: 131 mmol/L — ABNORMAL LOW (ref 135–145)

## 2021-09-09 MED ORDER — NAPHAZOLINE-GLYCERIN 0.012-0.25 % OP SOLN
2.0000 [drp] | Freq: Four times a day (QID) | OPHTHALMIC | Status: DC
  Administered 2021-09-09 – 2021-09-11 (×10): 2 [drp] via OPHTHALMIC
  Filled 2021-09-09: qty 15

## 2021-09-09 MED ORDER — SORBITOL 70 % SOLN
60.0000 mL | Status: AC
  Administered 2021-09-09: 60 mL via ORAL
  Filled 2021-09-09: qty 60

## 2021-09-09 NOTE — Progress Notes (Signed)
Occupational Therapy Session Note  Patient Details  Name: Maurice Little MRN: [id number] Date of Birth: 11-02-1955  Today's Date: 09/10/2021 OT Individual Time: 1500-1530 OT Individual Time Calculation (min): 30 min   Short Term Goals: Week 1:  OT Short Term Goal 1 (Week 1): Pt will maintain dynamic sitting balance at min guard assist during selfcare tasks sitting unsupported. OT Short Term Goal 2 (Week 1): Pt will complete LB bathing at min assist level sit to stand for two consecutive sessions,. OT Short Term Goal 3 (Week 1): Pt will complete LB dressing sit to stand with mod assist for two consecutive sessions. OT Short Term Goal 4 (Week 1): Pt will complete toilet transfers with min assist sit to stand.  Skilled Therapeutic Interventions/Progress Updates:    Pt greeted while semi reclined in TIS wearing Teds + abdominal binder, wife [name] present. Pt with no c/o pain. Worked on bimanual coordination needed for ADLs via folding linen in his room, pt having the most difficultly folding pillowcases x5 reps. He required cues for slow, precise movements. Provided him with a fine motor home exercise program using foam blocks, emphasizing pincer grasp and in-hand manipulations. Pt more ataxic with fine motor vs gross motor in the affected upper limb. He remained sitting up, all needs within reach.   Therapy Documentation Precautions:  Precautions Precautions: Fall Precaution Comments: diplopia, Ataxic R; L side numbness Restrictions Weight Bearing Restrictions: No  Vital Signs: Therapy Vitals Temp: 98.1 F (36.7 C) Temp Source: Oral Pulse Rate: 83 Resp: 18 BP: 134/89 Patient Position (if appropriate): Sitting Oxygen Therapy SpO2: 97 % O2 Device: Room Air Pain: no c/o pain during tx   ADL: ADL Eating: Maximal assistance Where Assessed-Eating: Bed level Grooming: Maximal assistance Where Assessed-Grooming: Bed level Upper Body Bathing: Minimal assistance Where Assessed-Upper Body  Bathing: Edge of bed Lower Body Bathing: Maximal assistance Where Assessed-Lower Body Bathing: Edge of bed Upper Body Dressing: Minimal assistance Where Assessed-Upper Body Dressing: Edge of bed Lower Body Dressing: Maximal assistance Where Assessed-Lower Body Dressing: Edge of bed Toileting: Dependent Where Assessed-Toileting: Bedside Commode Toilet Transfer: Maximal assistance Toilet Transfer Method: Stand pivot Acupuncturist: Animator Transfer: Not assessed Film/video editor: Not assessed  Therapy/Group: Individual Therapy  Clenton Esper A Zamar Odwyer 09/10/2021, 4:33 PM

## 2021-09-09 NOTE — Progress Notes (Signed)
Patient reported visual changes to right eye, from baseline. Code stroke was activated. Patient a/o x4 at the time.

## 2021-09-09 NOTE — Progress Notes (Addendum)
PROGRESS NOTE   Subjective/Complaints: Pt c/o blurred vision and some irritation in right eye. BP's have been more robust and DBP actually has been high. Pt feels constipated.   ROS: Patient denies fever, rash, sore throat,   nausea, vomiting, diarrhea, cough, shortness of breath or chest pain, joint or back pain, headache, or mood change.     Objective:   CT HEAD CODE STROKE WO CONTRAST  Result Date: 09/08/2021 CLINICAL DATA:  Initial evaluation for neuro deficit, stroke suspected. History of recent stroke, worsening symptoms. EXAM: CT HEAD WITHOUT CONTRAST CT ANGIOGRAPHY HEAD AND NECK TECHNIQUE: Multidetector CT imaging of the head and neck was performed using the standard protocol during bolus administration of intravenous contrast. Multiplanar CT image reconstructions and MIPs were obtained to evaluate the vascular anatomy. Carotid stenosis measurements (when applicable) are obtained utilizing NASCET criteria, using the distal internal carotid diameter as the denominator. CONTRAST:  75mL ISOVUE-370 IOPAMIDOL (ISOVUE-370) INJECTION 76% COMPARISON:  Recent studies from 09/05/2021 and 09/01/2021 FINDINGS: CT HEAD FINDINGS Brain: Continued interval evolution of right cerebellar and posterior pontine infarcts, overall similar in size and distribution as compared to previous. Associated edema has decreased from prior. No evidence for hemorrhagic transformation or other complication. No other acute large vessel territory infarct. No intracranial hemorrhage. No mass lesion or midline shift. No hydrocephalus or extra-axial fluid collection. Vascular: Vascular stent in place within the basilar artery again noted. No hyperdense vessel. Skull: Scalp soft tissues and calvarium demonstrate no acute finding. Sinuses/Orbits: Globes orbital soft tissues within normal limits. Scattered mucosal thickening noted within the ethmoidal air cells and maxillary  sinuses. Mastoid air cells remain clear. Other: None. ASPECTS (Alberta Stroke Program Early CT Score) - Ganglionic level infarction (caudate, lentiform nuclei, internal capsule, insula, M1-M3 cortex): 7 - Supraganglionic infarction (M4-M6 cortex): 3 Total score (0-10 with 10 being normal): 10 CTA NECK FINDINGS Aortic arch: Visualized aortic arch normal in caliber with normal branch pattern. No stenosis about the origin the great vessels. Right carotid system: Right CCA remains widely patent to the bifurcation. Atheromatous plaque at right carotid bulb with associated 65% stenosis, stable. Right ICA remains widely patent distally. Left carotid system: Left CCA remains patent to the bifurcation without stenosis. Atheromatous plaque at the left carotid bulb with associated 50% stenosis, stable. Left ICA remains patent distally. Vertebral arteries: Both vertebral arteries arise from subclavian arteries. Interval stenting at the origin/proximal right vertebral artery. Patent flow through the stent with good perfusion distally. Left vertebral artery remains patent within the neck. Skeleton: Stable.  No discrete or worrisome osseous lesions. Other neck: No other acute soft tissue abnormality within the neck. Upper chest: Scattered interlobular septal thickening within the visualized lungs, consistent with pulmonary interstitial congestion/edema. Review of the MIP images confirms the above findings CTA HEAD FINDINGS Anterior circulation: Petrous segments patent. Scattered atheromatous change within the carotid siphons without high-grade stenosis, stable. A1 segments remain patent. Normal anterior communicating artery complex. Anterior cerebral arteries remain patent. No M1 stenosis or occlusion. Normal MCA bifurcations. Distal MCA branches remain perfused and symmetric. Posterior circulation: Dominant right vertebral artery widely patent. Right PICA not visualized. Hypoplastic left vertebral artery terminates in PICA. Left  PICA  remains patent. Vascular stent in place within the basilar artery. Patent flow through the stent with good perfusion distally. Superior cerebral arteries patent bilaterally. Both PCAs well perfused a remain widely patent to their distal aspects. Venous sinuses: Patent allowing for timing the contrast bolus. Anatomic variants: Hypoplastic left vertebral artery terminates in PICA. Review of the MIP images confirms the above findings IMPRESSION: CT HEAD IMPRESSION: 1. Continued interval evolution of right cerebellar and posterior pontine infarcts, overall similar in size and distribution as compared to previous. No evidence for hemorrhagic transformation or other complication. 2. No other new acute intracranial abnormality. CTA HEAD AND NECK IMPRESSION: 1. Interval stenting at the origin/proximal right vertebral artery as well as the basilar artery. Patent flow through the stents with good perfusion distally. 2. Otherwise stable CTA of the head and neck. No large vessel occlusion. 3. 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 4. Findings consistent with pulmonary interstitial congestion/edema. Results were called by telephone at the time of interpretation on 09/08/2021 at 9:45 p.m. to provider [facility] , who verbally acknowledged these results. Electronically Signed   By: Rise Mu M.D.   On: 09/08/2021 23:15   CT ANGIO HEAD NECK W WO CM (CODE STROKE)  Result Date: 09/08/2021 CLINICAL DATA:  Initial evaluation for neuro deficit, stroke suspected. History of recent stroke, worsening symptoms. EXAM: CT HEAD WITHOUT CONTRAST CT ANGIOGRAPHY HEAD AND NECK TECHNIQUE: Multidetector CT imaging of the head and neck was performed using the standard protocol during bolus administration of intravenous contrast. Multiplanar CT image reconstructions and MIPs were obtained to evaluate the vascular anatomy. Carotid stenosis measurements (when applicable) are obtained utilizing NASCET  criteria, using the distal internal carotid diameter as the denominator. CONTRAST:  36mL ISOVUE-370 IOPAMIDOL (ISOVUE-370) INJECTION 76% COMPARISON:  Recent studies from 09/05/2021 and 09/01/2021 FINDINGS: CT HEAD FINDINGS Brain: Continued interval evolution of right cerebellar and posterior pontine infarcts, overall similar in size and distribution as compared to previous. Associated edema has decreased from prior. No evidence for hemorrhagic transformation or other complication. No other acute large vessel territory infarct. No intracranial hemorrhage. No mass lesion or midline shift. No hydrocephalus or extra-axial fluid collection. Vascular: Vascular stent in place within the basilar artery again noted. No hyperdense vessel. Skull: Scalp soft tissues and calvarium demonstrate no acute finding. Sinuses/Orbits: Globes orbital soft tissues within normal limits. Scattered mucosal thickening noted within the ethmoidal air cells and maxillary sinuses. Mastoid air cells remain clear. Other: None. ASPECTS (Alberta Stroke Program Early CT Score) - Ganglionic level infarction (caudate, lentiform nuclei, internal capsule, insula, M1-M3 cortex): 7 - Supraganglionic infarction (M4-M6 cortex): 3 Total score (0-10 with 10 being normal): 10 CTA NECK FINDINGS Aortic arch: Visualized aortic arch normal in caliber with normal branch pattern. No stenosis about the origin the great vessels. Right carotid system: Right CCA remains widely patent to the bifurcation. Atheromatous plaque at right carotid bulb with associated 65% stenosis, stable. Right ICA remains widely patent distally. Left carotid system: Left CCA remains patent to the bifurcation without stenosis. Atheromatous plaque at the left carotid bulb with associated 50% stenosis, stable. Left ICA remains patent distally. Vertebral arteries: Both vertebral arteries arise from subclavian arteries. Interval stenting at the origin/proximal right vertebral artery. Patent flow  through the stent with good perfusion distally. Left vertebral artery remains patent within the neck. Skeleton: Stable.  No discrete or worrisome osseous lesions. Other neck: No other acute soft tissue abnormality within the neck. Upper chest: Scattered interlobular septal thickening  within the visualized lungs, consistent with pulmonary interstitial congestion/edema. Review of the MIP images confirms the above findings CTA HEAD FINDINGS Anterior circulation: Petrous segments patent. Scattered atheromatous change within the carotid siphons without high-grade stenosis, stable. A1 segments remain patent. Normal anterior communicating artery complex. Anterior cerebral arteries remain patent. No M1 stenosis or occlusion. Normal MCA bifurcations. Distal MCA branches remain perfused and symmetric. Posterior circulation: Dominant right vertebral artery widely patent. Right PICA not visualized. Hypoplastic left vertebral artery terminates in PICA. Left PICA remains patent. Vascular stent in place within the basilar artery. Patent flow through the stent with good perfusion distally. Superior cerebral arteries patent bilaterally. Both PCAs well perfused a remain widely patent to their distal aspects. Venous sinuses: Patent allowing for timing the contrast bolus. Anatomic variants: Hypoplastic left vertebral artery terminates in PICA. Review of the MIP images confirms the above findings IMPRESSION: CT HEAD IMPRESSION: 1. Continued interval evolution of right cerebellar and posterior pontine infarcts, overall similar in size and distribution as compared to previous. No evidence for hemorrhagic transformation or other complication. 2. No other new acute intracranial abnormality. CTA HEAD AND NECK IMPRESSION: 1. Interval stenting at the origin/proximal right vertebral artery as well as the basilar artery. Patent flow through the stents with good perfusion distally. 2. Otherwise stable CTA of the head and neck. No large vessel  occlusion. 3. 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 4. Findings consistent with pulmonary interstitial congestion/edema. Results were called by telephone at the time of interpretation on 09/08/2021 at 9:45 p.m. to provider [facility] , who verbally acknowledged these results. Electronically Signed   By: Rise Mu M.D.   On: 09/08/2021 23:15   Recent Labs    09/08/21 0533 09/09/21 0541  WBC 8.3 7.4  HGB 12.9* 12.8*  HCT 39.2 38.3*  PLT 289 286   Recent Labs    09/08/21 0533 09/09/21 0541  NA 134* 131*  K 4.4 3.8  CL 102 100  CO2 25 22  GLUCOSE 110* 109*  BUN 16 12  CREATININE 0.93 0.82  CALCIUM 9.0 8.7*    Intake/Output Summary (Last 24 hours) at 09/09/2021 4008 Last data filed at 09/09/2021 0532 Gross per 24 hour  Intake --  Output 2801 ml  Net -2801 ml        Physical Exam: Vital Signs Blood pressure (!) 128/95, pulse 85, temperature 98.4 F (36.9 C), resp. rate 16, height 6' (1.829 m), SpO2 100 %.   Constitutional: No distress . Vital signs reviewed. HEENT: NCAT, EOMI, sclera irritated on right Neck: supple Cardiovascular: RRR without murmur. No JVD    Respiratory/Chest: CTA Bilaterally without wheezes or rales. Normal effort    GI/Abdomen: BS +, non-tender, non-distended Ext: no clubbing, cyanosis, or edema Psych: pleasant and cooperative, sl anxious  Musculoskeletal:     Cervical back: Normal range of motion. No rigidity.     Comments: 5/5 in RUE/RLE/LUE/LLE  Skin:    Comments: R hand IV- look OK No skin breakdown seen  Neurological:     Mental Status: He is alert and oriented to person, place, and time.     Comments: Right facial weakness with flat,dysarthric speech. Unable to close right eye.  Ataxia with finger to nose Left>Right as well as decrease in LLE motor control. Sensory deficits left face, LUE and LLE.  Dysarthric. Fair processing.   Assessment/Plan: 1. Functional deficits which require 3+ hours  per day of interdisciplinary therapy in a comprehensive inpatient rehab setting. Physiatrist  is providing close team supervision and 24 hour management of active medical problems listed below. Physiatrist and rehab team continue to assess barriers to discharge/monitor patient progress toward functional and medical goals  Care Tool:  Bathing    Body parts bathed by patient: Right arm, Left arm, Chest, Abdomen, Right upper leg, Left upper leg, Right lower leg, Left lower leg, Face, Front perineal area, Buttocks   Body parts bathed by helper: Front perineal area, Buttocks     Bathing assist Assist Level: Minimal Assistance - Patient > 75%     Upper Body Dressing/Undressing Upper body dressing   What is the patient wearing?: Pull over shirt    Upper body assist Assist Level: Supervision/Verbal cueing    Lower Body Dressing/Undressing Lower body dressing      What is the patient wearing?: Pants, Incontinence brief     Lower body assist Assist for lower body dressing: Moderate Assistance - Patient 50 - 74%     Toileting Toileting Toileting Activity did not occur (Clothing management and hygiene only): N/A (no void or bm) (foley in place)  Toileting assist Assist for toileting: Maximal Assistance - Patient 25 - 49%     Transfers Chair/bed transfer  Transfers assist     Chair/bed transfer assist level: Moderate Assistance - Patient 50 - 74% (squat pivot)     Locomotion Ambulation   Ambulation assist   Ambulation activity did not occur: Safety/medical concerns (requires +2 and skilled assist with use of hallway rail)          Walk 10 feet activity   Assist  Walk 10 feet activity did not occur: Safety/medical concerns        Walk 50 feet activity   Assist Walk 50 feet with 2 turns activity did not occur: Safety/medical concerns         Walk 150 feet activity   Assist Walk 150 feet activity did not occur: Safety/medical concerns         Walk  10 feet on uneven surface  activity   Assist Walk 10 feet on uneven surfaces activity did not occur: Safety/medical concerns         Wheelchair     Assist Is the patient using a wheelchair?: Yes   Wheelchair activity did not occur: Safety/medical concerns         Wheelchair 50 feet with 2 turns activity    Assist            Wheelchair 150 feet activity     Assist          Blood pressure (!) 128/95, pulse 85, temperature 98.4 F (36.9 C), resp. rate 16, height 6' (1.829 m), SpO2 100 %.    Medical Problem List and Plan: 1.  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  Antithrombotics: -DVT/anticoagulation:  Pharmaceutical: Lovenox added.              -antiplatelet therapy:  ASA/Brilinta. 3. Pain Management: Tylenol prn for HA.  4. Mood: LCSW to follow for evaluation and support.              -antipsychotic agents: N/A 5. Neuropsych: This patient is capable of making decisions on his own behalf. 6. Skin/Wound Care: Routine pressure-relief measures. 7. Fluids/Electrolytes/Nutrition: Monitor I's/O.  Check CMet in AM 8.  HTN with new hypotension/orthostaitc: Monitor BP TID.  Orthostatic symptoms reported today. --On hydralazine 25 mg 3 times daily, Cozaar 50 mg twice daily and amlodipine 10 mg daily.  11/9- orthostatic today- decreased losartan to 25 mg BID and decreased Norvasc to 5 mg daily- will add TEDs and ACE wraps and see how pt does.  --We will check orthostatic vital signs. 11/10- BP 149/80s- con't regimen 11/11- BP drops to  70s/50s per pt/wife and dizzy- will decrease hydralazine to 10 mg TID (BP is running in bed 150s/160s/systolic) will try Midodrine 2.5 mg with breakfast/lunch- advised to sit up after midodrine for 4 hours.  11/13- BP dropped to 100s yesterday -continue to hold Losartan for now - con't abd binder/TEDs  -encourage adequate fluid intake 9.  Urinary retention: Likely exacerbated by constipation. Augment bowel program and remove foley in am.  --Continue Flomax--changed to bedtime to avoid orthostatic symptoms. 11/9- increased Flomax to 0.8 mg nightly, however will check U/A and Cx due to elevated WBC and retention. 11/13 Foley remains.  -ua,ucx neg  -consider voiding trial again monday 10. Constipation: Will start patient on Senna S daily.  --MOM today.   11/9- didn't poop- give sorbitol 60cc x1 this afternoon  11/13--repeat sorbitol today 11.  Leukocytosis: Monitor for fevers and other signs of infection.  11/9- check U/A and Cx- since more elevated- afebrile.   11/10- WBC down to 9k- checking CXR just in case.   11/12- CXR looks good! 12.  Intermittent hypokalemia: Recheck CMET/Mg level in am.    --May need standing dose supplement is on hydralazine. 11/11- K+ 4.8- could be  hemolysis vs supplement-  13.  Hyperglycemia: Likely stress related as Hemoglobin A1c 5.5. 14.  Dyslipidemia: Continue Lipitor. 15. 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 16. Unable to close right eye lid. 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 May keep at bedside     LOS: 5 days A FACE TO FACE EVALUATION WAS PERFORMED  Ranelle Oyster 09/09/2021, 8:12 AM

## 2021-09-10 DIAGNOSIS — I639 Cerebral infarction, unspecified: Secondary | ICD-10-CM | POA: Diagnosis not present

## 2021-09-10 LAB — BASIC METABOLIC PANEL
Anion gap: 7 (ref 5–15)
BUN: 13 mg/dL (ref 8–23)
CO2: 23 mmol/L (ref 22–32)
Calcium: 8.7 mg/dL — ABNORMAL LOW (ref 8.9–10.3)
Chloride: 102 mmol/L (ref 98–111)
Creatinine, Ser: 0.82 mg/dL (ref 0.61–1.24)
GFR, Estimated: >60 mL/min (ref >60)
Glucose, Bld: 98 mg/dL (ref 70–99)
Potassium: 3.9 mmol/L (ref 3.5–5.1)
Sodium: 132 mmol/L — ABNORMAL LOW (ref 135–145)

## 2021-09-10 LAB — CBC
HCT: 35.6 % — ABNORMAL LOW (ref 39.0–52.0)
Hemoglobin: 12 g/dL — ABNORMAL LOW (ref 13.0–17.0)
MCH: 31.8 pg (ref 26.0–34.0)
MCHC: 33.7 g/dL (ref 30.0–36.0)
MCV: 94.4 fL (ref 80.0–100.0)
Platelets: 290 10*3/uL (ref 150–400)
RBC: 3.77 MIL/uL — ABNORMAL LOW (ref 4.22–5.81)
RDW: 13.2 % (ref 11.5–15.5)
WBC: 7.2 10*3/uL (ref 4.0–10.5)
nRBC: 0 % (ref 0.0–0.2)

## 2021-09-10 NOTE — Progress Notes (Signed)
Occupational Therapy Session Note  Patient Details  Name: Maurice Little MRN: [id number] Date of Birth: April 18, 1956  Today's Date: 09/10/2021 OT Individual Time: 0901-1001 OT Individual Time Calculation (min): 60 min    Short Term Goals: Week 1:  OT Short Term Goal 1 (Week 1): Pt will maintain dynamic sitting balance at min guard assist during selfcare tasks sitting unsupported. OT Short Term Goal 2 (Week 1): Pt will complete LB bathing at min assist level sit to stand for two consecutive sessions,. OT Short Term Goal 3 (Week 1): Pt will complete LB dressing sit to stand with mod assist for two consecutive sessions. OT Short Term Goal 4 (Week 1): Pt will complete toilet transfers with min assist sit to stand.  Skilled Therapeutic Interventions/Progress Updates:    Pt in tilt in space wheelchair to start with spouse present.  BP taken in sitting at 143/91 with pt reporting some dizziness at 3/10 in sitting but no light headedness.  Took him down to the ortho gym where he transferred to the therapy mat with mod assist secondary to increased trunkial ataxia and decreased balance.  Once on the mat had him work on visual tracking exercises and convergence exercises.  Noted horizontal nystagmus in both eyes with tracking with the right being more severe than the left.  Decreased far lateral occular ROM in the right eye noted as well.  Diplopia is present at midline and to the right with near vision, but single vision is noted in the left visual field.  Applied left nasal occlusion tape to help decrease diplopia on the right.  Pt reports increased blurry vision in the right eye when uncovered, but was able to correctly identify the number of fingers therapist was holding up.  Provided handout for vision exercises to be completed daily to help with oculomotor strengthening.  Reviewed with pt and also spouse once back in the room at end of session.  Had pt also work on sit to stand to sit transitions with slower  control from the therapy mat.  Min assist to complete with cueing to flex at the waist to help maintain balance over his knees and feet.  Increased posterior lean at times, with faster than descent to sitting.  He reports dizziness at 6/10 with standing with BP at 161/107.  Finished session with return to the room via wheelchair with pt left sitting up with his spouse in the room as well as the safety belt in place.    Therapy Documentation Precautions:  Precautions Precautions: Fall Precaution Comments: diplopia, Ataxic R; L side numbness Restrictions Weight Bearing Restrictions: No  Pain: Pain Assessment Pain Scale: 0-10 Pain Score: 0-No pain ADL: See Care Tool Section for some details of mobility and selfcare  Therapy/Group: Individual Therapy  Berwyn Bigley OTR/L 09/10/2021, 12:27 PM

## 2021-09-10 NOTE — Progress Notes (Signed)
Speech Language Pathology Daily Session Note  Patient Details  Name: Maurice Little MRN: [id number] Date of Birth: 07/17/1956  Today's Date: 09/10/2021 SLP Individual Time: 1001-1030 SLP Individual Time Calculation (min): 29 min  Short Term Goals: Week 1: SLP Short Term Goal 1 (Week 1): Patient will implement speech intelligiblity strategies at sentence level with min A verbal cues. SLP Short Term Goal 2 (Week 1): Patient will consume current diet with minimal-to-no overt s/sx of aspiration and sup A for implementation of swallowing safety SLP Short Term Goal 3 (Week 1): Patient will participate in further cognitive-linguistic evaluation to further identify/manage functional needs  Skilled Therapeutic Interventions: Pt seen for skilled ST with focus on speech and swallow goals, wife present throughout. When SLP entered RN present for med pass. Pt observed taking 4 whole and 1/2 pill with liquid wash via cup with no apparent difficulty. Pt demonstrates adequate insight into current medical deficits including motor speech and swallowing. SLP facilitating thin liquid trials via straw by providing Supervision A cues for bolus and rate control, pt with no overt s/s aspiration or change in vocal quality with any trials. Pt able to recall "slow" and "loud" for speech intelligibility strategies, introduced to over articulate and pace. Pt able to utilize compensatory speech strategies with SPV-min A cues with intelligibility ~90% at conversation level. Discussed importance of utilizing these strategies while on the phone, wife wants him to practice speaking like he is speaking to a client on the phone. Pt left in wheelchair with alarm belt present and all needs within reach. Cont ST POC.   Pain Pain Assessment Pain Scale: 0-10 Pain Score: 0-No pain  Therapy/Group: Individual Therapy  Tacey Ruiz 09/10/2021, 12:20 PM

## 2021-09-10 NOTE — Progress Notes (Signed)
Physical Therapy Session Note  Patient Details  Name: Maurice Little MRN: [id number] Date of Birth: 09/28/1956  Today's Date: 09/10/2021 PT Individual Time: 5784-6962; 9528-4132 PT Individual Time Calculation (min): 27 min and 45 mins  Short Term Goals: Week 1:  PT Short Term Goal 1 (Week 1): Pt will perform supine<>sit with supervision PT Short Term Goal 2 (Week 1): Pt will perform sit<>stands with mod assist of 1 consistently PT Short Term Goal 3 (Week 1): Pt will perform bed<>chair transfers with mod assist of 1 consistently PT Short Term Goal 4 (Week 1): Pt will ambulate at least 28ft using LRAD with +2 mod assist  Skilled Therapeutic Interventions/Progress Updates:    Session 1: Patient received reclined in bed, agreeable to PT. He denies pain, but endorses fatigue. BP reclined: 148/93. He was able to come sit edge of bed with CGA and HOB elevated. Transfer to wc via squat pivot MinA x2. BP in wc: 148/126. PT transporting patient in wc to therapy gym for time management and energy conservation. He ambulated 64ft x2 with ModA x2 HHA and wc follow. 2nd bout of gait with 4# ankle weight to R LE, still with ModA x2 and wc follow, but improved R LE ataxia noted. He did maintain general posterior weight shift with a near sway-back presentation noting poor core engagement with truncal ataxia. Patient returning to room in wc, seatbelt alarm on, call light within reach.    Session 2: Patient received reclined in bed,agreeable to PT. He denies pain, but endorses fatigue. He was able to come sit edge of bed with supervision and HOB elevated. Patient transferring to wc via stand pivot with ModA. PT transporting patient in wc to therapy gym for time management and energy conservation. He ambulated 80ft with B HHA ModA x2. Visual cue of parallel line on the ground for patient to maintain appropriate BOS. He continues to have posterior lean, R LE ataxia and truncal ataxia. PT applying 3# ankle weight to B LE  with noted improvement in R LE ataxia- he ambulated 20ft with HHA ModA+MinA. Patient continues to fatigue very easily, but after extended rest, was agreeable to further gait tx. Patient ambulating additional 98ft with HHA 3# ankle weight, ModA + MinA. Receptive to cues to step to beat of song for equal B step length with good results ~50-75% of the time. Patient completing seated NMR task with modified D1 diagonal to target on 4" step initially with 3# ankle weight B, then without additional weight. Patient with noted slight increase in ataxia without added weight. Patient returning to room in wc, seatbelt alarm on, call light within reach.   Therapy Documentation Precautions:  Precautions Precautions: Other (comment), Fall Precaution Comments: diplopia, Ataxic R; L side numbness Restrictions Weight Bearing Restrictions: No    Therapy/Group: Individual Therapy  Elizebeth Koller, PT, DPT, CBIS  09/10/2021, 7:41 AM

## 2021-09-10 NOTE — Progress Notes (Signed)
PROGRESS NOTE   Subjective/Complaints: Appreciate neuro consult , discussed stroke deficits as they pertain to stroke anatomy   ROS: Patient denies fever, rash, sore throat,   nausea, vomiting, diarrhea, cough, shortness of breath or chest pain, joint or back pain, headache, or mood change.     Objective:   CT HEAD CODE STROKE WO CONTRAST  Result Date: 09/08/2021 CLINICAL DATA:  Initial evaluation for neuro deficit, stroke suspected. History of recent stroke, worsening symptoms. EXAM: CT HEAD WITHOUT CONTRAST CT ANGIOGRAPHY HEAD AND NECK TECHNIQUE: Multidetector CT imaging of the head and neck was performed using the standard protocol during bolus administration of intravenous contrast. Multiplanar CT image reconstructions and MIPs were obtained to evaluate the vascular anatomy. Carotid stenosis measurements (when applicable) are obtained utilizing NASCET criteria, using the distal internal carotid diameter as the denominator. CONTRAST:  43mL ISOVUE-370 IOPAMIDOL (ISOVUE-370) INJECTION 76% COMPARISON:  Recent studies from 09/05/2021 and 09/01/2021 FINDINGS: CT HEAD FINDINGS Brain: Continued interval evolution of right cerebellar and posterior pontine infarcts, overall similar in size and distribution as compared to previous. Associated edema has decreased from prior. No evidence for hemorrhagic transformation or other complication. No other acute large vessel territory infarct. No intracranial hemorrhage. No mass lesion or midline shift. No hydrocephalus or extra-axial fluid collection. Vascular: Vascular stent in place within the basilar artery again noted. No hyperdense vessel. Skull: Scalp soft tissues and calvarium demonstrate no acute finding. Sinuses/Orbits: Globes orbital soft tissues within normal limits. Scattered mucosal thickening noted within the ethmoidal air cells and maxillary sinuses. Mastoid air cells remain clear. Other: None.  ASPECTS (Alberta Stroke Program Early CT Score) - Ganglionic level infarction (caudate, lentiform nuclei, internal capsule, insula, M1-M3 cortex): 7 - Supraganglionic infarction (M4-M6 cortex): 3 Total score (0-10 with 10 being normal): 10 CTA NECK FINDINGS Aortic arch: Visualized aortic arch normal in caliber with normal branch pattern. No stenosis about the origin the great vessels. Right carotid system: Right CCA remains widely patent to the bifurcation. Atheromatous plaque at right carotid bulb with associated 65% stenosis, stable. Right ICA remains widely patent distally. Left carotid system: Left CCA remains patent to the bifurcation without stenosis. Atheromatous plaque at the left carotid bulb with associated 50% stenosis, stable. Left ICA remains patent distally. Vertebral arteries: Both vertebral arteries arise from subclavian arteries. Interval stenting at the origin/proximal right vertebral artery. Patent flow through the stent with good perfusion distally. Left vertebral artery remains patent within the neck. Skeleton: Stable.  No discrete or worrisome osseous lesions. Other neck: No other acute soft tissue abnormality within the neck. Upper chest: Scattered interlobular septal thickening within the visualized lungs, consistent with pulmonary interstitial congestion/edema. Review of the MIP images confirms the above findings CTA HEAD FINDINGS Anterior circulation: Petrous segments patent. Scattered atheromatous change within the carotid siphons without high-grade stenosis, stable. A1 segments remain patent. Normal anterior communicating artery complex. Anterior cerebral arteries remain patent. No M1 stenosis or occlusion. Normal MCA bifurcations. Distal MCA branches remain perfused and symmetric. Posterior circulation: Dominant right vertebral artery widely patent. Right PICA not visualized. Hypoplastic left vertebral artery terminates in PICA. Left PICA remains patent. Vascular stent in place within  the basilar artery. Patent  flow through the stent with good perfusion distally. Superior cerebral arteries patent bilaterally. Both PCAs well perfused a remain widely patent to their distal aspects. Venous sinuses: Patent allowing for timing the contrast bolus. Anatomic variants: Hypoplastic left vertebral artery terminates in PICA. Review of the MIP images confirms the above findings IMPRESSION: CT HEAD IMPRESSION: 1. Continued interval evolution of right cerebellar and posterior pontine infarcts, overall similar in size and distribution as compared to previous. No evidence for hemorrhagic transformation or other complication. 2. No other new acute intracranial abnormality. CTA HEAD AND NECK IMPRESSION: 1. Interval stenting at the origin/proximal right vertebral artery as well as the basilar artery. Patent flow through the stents with good perfusion distally. 2. Otherwise stable CTA of the head and neck. No large vessel occlusion. 3. 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 4. Findings consistent with pulmonary interstitial congestion/edema. Results were called by telephone at the time of interpretation on 09/08/2021 at 9:45 p.m. to provider [facility] , who verbally acknowledged these results. Electronically Signed   By: Rise Mu M.D.   On: 09/08/2021 23:15   CT ANGIO HEAD NECK W WO CM (CODE STROKE)  Result Date: 09/08/2021 CLINICAL DATA:  Initial evaluation for neuro deficit, stroke suspected. History of recent stroke, worsening symptoms. EXAM: CT HEAD WITHOUT CONTRAST CT ANGIOGRAPHY HEAD AND NECK TECHNIQUE: Multidetector CT imaging of the head and neck was performed using the standard protocol during bolus administration of intravenous contrast. Multiplanar CT image reconstructions and MIPs were obtained to evaluate the vascular anatomy. Carotid stenosis measurements (when applicable) are obtained utilizing NASCET criteria, using the distal internal carotid diameter  as the denominator. CONTRAST:  26mL ISOVUE-370 IOPAMIDOL (ISOVUE-370) INJECTION 76% COMPARISON:  Recent studies from 09/05/2021 and 09/01/2021 FINDINGS: CT HEAD FINDINGS Brain: Continued interval evolution of right cerebellar and posterior pontine infarcts, overall similar in size and distribution as compared to previous. Associated edema has decreased from prior. No evidence for hemorrhagic transformation or other complication. No other acute large vessel territory infarct. No intracranial hemorrhage. No mass lesion or midline shift. No hydrocephalus or extra-axial fluid collection. Vascular: Vascular stent in place within the basilar artery again noted. No hyperdense vessel. Skull: Scalp soft tissues and calvarium demonstrate no acute finding. Sinuses/Orbits: Globes orbital soft tissues within normal limits. Scattered mucosal thickening noted within the ethmoidal air cells and maxillary sinuses. Mastoid air cells remain clear. Other: None. ASPECTS (Alberta Stroke Program Early CT Score) - Ganglionic level infarction (caudate, lentiform nuclei, internal capsule, insula, M1-M3 cortex): 7 - Supraganglionic infarction (M4-M6 cortex): 3 Total score (0-10 with 10 being normal): 10 CTA NECK FINDINGS Aortic arch: Visualized aortic arch normal in caliber with normal branch pattern. No stenosis about the origin the great vessels. Right carotid system: Right CCA remains widely patent to the bifurcation. Atheromatous plaque at right carotid bulb with associated 65% stenosis, stable. Right ICA remains widely patent distally. Left carotid system: Left CCA remains patent to the bifurcation without stenosis. Atheromatous plaque at the left carotid bulb with associated 50% stenosis, stable. Left ICA remains patent distally. Vertebral arteries: Both vertebral arteries arise from subclavian arteries. Interval stenting at the origin/proximal right vertebral artery. Patent flow through the stent with good perfusion distally. Left  vertebral artery remains patent within the neck. Skeleton: Stable.  No discrete or worrisome osseous lesions. Other neck: No other acute soft tissue abnormality within the neck. Upper chest: Scattered interlobular septal thickening within the visualized lungs, consistent with pulmonary interstitial congestion/edema. Review of  the MIP images confirms the above findings CTA HEAD FINDINGS Anterior circulation: Petrous segments patent. Scattered atheromatous change within the carotid siphons without high-grade stenosis, stable. A1 segments remain patent. Normal anterior communicating artery complex. Anterior cerebral arteries remain patent. No M1 stenosis or occlusion. Normal MCA bifurcations. Distal MCA branches remain perfused and symmetric. Posterior circulation: Dominant right vertebral artery widely patent. Right PICA not visualized. Hypoplastic left vertebral artery terminates in PICA. Left PICA remains patent. Vascular stent in place within the basilar artery. Patent flow through the stent with good perfusion distally. Superior cerebral arteries patent bilaterally. Both PCAs well perfused a remain widely patent to their distal aspects. Venous sinuses: Patent allowing for timing the contrast bolus. Anatomic variants: Hypoplastic left vertebral artery terminates in PICA. Review of the MIP images confirms the above findings IMPRESSION: CT HEAD IMPRESSION: 1. Continued interval evolution of right cerebellar and posterior pontine infarcts, overall similar in size and distribution as compared to previous. No evidence for hemorrhagic transformation or other complication. 2. No other new acute intracranial abnormality. CTA HEAD AND NECK IMPRESSION: 1. Interval stenting at the origin/proximal right vertebral artery as well as the basilar artery. Patent flow through the stents with good perfusion distally. 2. Otherwise stable CTA of the head and neck. No large vessel occlusion. 3. 65% right and 50% left carotid artery  origin stenoses, stable. 4. Findings consistent with pulmonary interstitial congestion/edema. Results were called by telephone at the time of interpretation on 09/08/2021 at 9:45 p.m. to provider [facility] , who verbally acknowledged these results. Electronically Signed   By: Rise Mu M.D.   On: 09/08/2021 23:15   Recent Labs    09/09/21 0541 09/10/21 0526  WBC 7.4 7.2  HGB 12.8* 12.0*  HCT 38.3* 35.6*  PLT 286 290    Recent Labs    09/09/21 0541 09/10/21 0526  NA 131* 132*  K 3.8 3.9  CL 100 102  CO2 22 23  GLUCOSE 109* 98  BUN 12 13  CREATININE 0.82 0.82  CALCIUM 8.7* 8.7*     Intake/Output Summary (Last 24 hours) at 09/10/2021 0742 Last data filed at 09/10/2021 0658 Gross per 24 hour  Intake 956 ml  Output 2550 ml  Net -1594 ml         Physical Exam: Vital Signs Blood pressure (!) 146/90, pulse 79, temperature 98.7 F (37.1 C), resp. rate 16, height 6' (1.829 m), SpO2 98 %.   General: No acute distress Mood and affect are appropriate Heart: Regular rate and rhythm no rubs murmurs or extra sounds Lungs: Clear to auscultation, breathing unlabored, no rales or wheezes Abdomen: Positive bowel sounds, soft nontender to palpation, nondistended Extremities: No clubbing, cyanosis, or edema Skin: No evidence of breakdown, no evidence of rash   Musculoskeletal:     Cervical back: Normal range of motion. No rigidity.     Comments: 5/5 in RUE/RLE/LUE/LLE  Skin:    Comments: R hand IV- look OK No skin breakdown seen  Neurological:     Mental Status: He is alert and oriented to person, place, and time.     Comments: Right facial weakness with flat,dysarthric speech. Unable to close right eye.  Ataxia with finger to nose Left>Right as well as decrease in LLE motor control. Sensory deficits left face, LUE and LLE.  Dysarthric. Fair processing.   Assessment/Plan: 1. Functional deficits which require 3+ hours per day of interdisciplinary therapy  in a comprehensive inpatient rehab setting. Physiatrist is providing close team supervision and  24 hour management of active medical problems listed below. Physiatrist and rehab team continue to assess barriers to discharge/monitor patient progress toward functional and medical goals  Care Tool:  Bathing    Body parts bathed by patient: Right arm, Left arm, Chest, Abdomen, Right upper leg, Left upper leg, Right lower leg, Left lower leg, Face, Front perineal area, Buttocks   Body parts bathed by helper: Front perineal area, Buttocks     Bathing assist Assist Level: Minimal Assistance - Patient > 75%     Upper Body Dressing/Undressing Upper body dressing   What is the patient wearing?: Pull over shirt    Upper body assist Assist Level: Supervision/Verbal cueing    Lower Body Dressing/Undressing Lower body dressing      What is the patient wearing?: Pants, Incontinence brief     Lower body assist Assist for lower body dressing: Moderate Assistance - Patient 50 - 74%     Toileting Toileting Toileting Activity did not occur (Clothing management and hygiene only): N/A (no void or bm) (foley in place)  Toileting assist Assist for toileting: Maximal Assistance - Patient 25 - 49%     Transfers Chair/bed transfer  Transfers assist     Chair/bed transfer assist level: Moderate Assistance - Patient 50 - 74% (squat pivot)     Locomotion Ambulation   Ambulation assist   Ambulation activity did not occur: Safety/medical concerns (requires +2 and skilled assist with use of hallway rail)          Walk 10 feet activity   Assist  Walk 10 feet activity did not occur: Safety/medical concerns        Walk 50 feet activity   Assist Walk 50 feet with 2 turns activity did not occur: Safety/medical concerns         Walk 150 feet activity   Assist Walk 150 feet activity did not occur: Safety/medical concerns         Walk 10 feet on uneven surface   activity   Assist Walk 10 feet on uneven surfaces activity did not occur: Safety/medical concerns         Wheelchair     Assist Is the patient using a wheelchair?: Yes   Wheelchair activity did not occur: Safety/medical concerns         Wheelchair 50 feet with 2 turns activity    Assist            Wheelchair 150 feet activity     Assist          Blood pressure (!) 146/90, pulse 79, temperature 98.7 F (37.1 C), resp. rate 16, height 6' (1.829 m), SpO2 98 %.    Medical Problem List and Plan: 1.  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  Antithrombotics: -DVT/anticoagulation:  Pharmaceutical: Lovenox added.              -antiplatelet therapy: ASA/Brilinta. 3. Pain Management: Tylenol prn for HA.  4. Mood: LCSW to follow for evaluation and support.              -antipsychotic agents: N/A 5. Neuropsych: This patient is capable of making decisions on his own  behalf. 6. Skin/Wound Care: Routine pressure-relief measures. 7. Fluids/Electrolytes/Nutrition: Monitor I's/O.  Check CMet in AM 8.  HTN with new hypotension/orthostaitc: Monitor BP TID.  Orthostatic symptoms reported today. --On hydralazine 25 mg 3 times daily, Cozaar 50 mg twice daily and amlodipine 10 mg daily.  11/9- orthostatic today- decreased losartan to 25 mg BID and decreased Norvasc to 5 mg daily- will add TEDs and ACE wraps and see how pt does.  --We will check orthostatic vital signs. 11/10- BP 149/80s- con't regimen 11/11- BP drops to 70s/50s per pt/wife and dizzy- will decrease hydralazine to 10 mg TID (BP is running in bed 150s/160s/systolic) will try Midodrine 2.5 mg with breakfast/lunch- advised to sit up after midodrine for 4 hours.  11/13- BP dropped to 100s  yesterday -continue to hold Losartan for now - con't abd binder/TEDs  -encourage adequate fluid intake 9.  Urinary retention: Likely exacerbated by constipation. Augment bowel program and remove foley in am.  --Continue Flomax--changed to bedtime to avoid orthostatic symptoms. 11/9- increased Flomax to 0.8 mg nightly, however will check U/A and Cx due to elevated WBC and retention. 11/13 Foley remains.  -ua,ucx neg  -consider voiding trial again monday 10. Constipation: Will start patient on Senna S daily.  --MOM today.   11/9- didn't poop- give sorbitol 60cc x1 this afternoon  11/13--repeat sorbitol today 11.  Leukocytosis: Monitor for fevers and other signs of infection.  11/9- check U/A and Cx- since more elevated- afebrile.   11/10- WBC down to 9k- checking CXR just in case.   11/12- CXR looks good! 12.  Intermittent hypokalemia: Recheck CMET/Mg level in am.    --May need standing dose supplement is on hydralazine. 11/11- K+ 4.8- could be  hemolysis vs supplement-  13.  Hyperglycemia: Likely stress related as Hemoglobin A1c 5.5. 14.  Dyslipidemia: Continue Lipitor. 15. 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 16. Unable to close right eye lid. 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 May keep at bedside     LOS: 6 days A FACE TO FACE EVALUATION WAS PERFORMED  Erick Colace 09/10/2021, 7:42 AM

## 2021-09-11 DIAGNOSIS — I639 Cerebral infarction, unspecified: Secondary | ICD-10-CM | POA: Diagnosis not present

## 2021-09-11 LAB — BASIC METABOLIC PANEL
Anion gap: 11 (ref 5–15)
BUN: 15 mg/dL (ref 8–23)
CO2: 21 mmol/L — ABNORMAL LOW (ref 22–32)
Calcium: 8.8 mg/dL — ABNORMAL LOW (ref 8.9–10.3)
Chloride: 103 mmol/L (ref 98–111)
Creatinine, Ser: 0.8 mg/dL (ref 0.61–1.24)
GFR, Estimated: >60 mL/min (ref >60)
Glucose, Bld: 106 mg/dL — ABNORMAL HIGH (ref 70–99)
Potassium: 4 mmol/L (ref 3.5–5.1)
Sodium: 135 mmol/L (ref 135–145)

## 2021-09-11 LAB — CBC
HCT: 37.5 % — ABNORMAL LOW (ref 39.0–52.0)
Hemoglobin: 12.9 g/dL — ABNORMAL LOW (ref 13.0–17.0)
MCH: 32.3 pg (ref 26.0–34.0)
MCHC: 34.4 g/dL (ref 30.0–36.0)
MCV: 93.8 fL (ref 80.0–100.0)
Platelets: 277 10*3/uL (ref 150–400)
RBC: 4 MIL/uL — ABNORMAL LOW (ref 4.22–5.81)
RDW: 13.2 % (ref 11.5–15.5)
WBC: 8.3 10*3/uL (ref 4.0–10.5)
nRBC: 0 % (ref 0.0–0.2)

## 2021-09-11 MED ORDER — ARTIFICIAL TEARS OPHTHALMIC OINT
TOPICAL_OINTMENT | Freq: Every day | OPHTHALMIC | Status: DC
  Administered 2021-09-23: 1 via OPHTHALMIC
  Filled 2021-09-11: qty 3.5

## 2021-09-11 MED ORDER — ACETAMINOPHEN 325 MG PO TABS: 325.0000 mg | ORAL_TABLET | ORAL | Status: AC | PRN

## 2021-09-11 MED ORDER — NAPHAZOLINE-GLYCERIN 0.012-0.25 % OP SOLN
1.0000 [drp] | Freq: Three times a day (TID) | OPHTHALMIC | Status: DC
Start: 2021-09-11 — End: 2021-09-27
  Administered 2021-09-11: 2 [drp] via OPHTHALMIC
  Administered 2021-09-11: 1 [drp] via OPHTHALMIC
  Administered 2021-09-12 – 2021-09-13 (×5): 2 [drp] via OPHTHALMIC
  Administered 2021-09-13: 21:00:00 1 [drp] via OPHTHALMIC
  Administered 2021-09-13 – 2021-09-14 (×5): 2 [drp] via OPHTHALMIC
  Administered 2021-09-14 – 2021-09-16 (×7): 1 [drp] via OPHTHALMIC
  Administered 2021-09-17 (×3): 2 [drp] via OPHTHALMIC
  Administered 2021-09-17: 1 [drp] via OPHTHALMIC
  Administered 2021-09-18: 2 [drp] via OPHTHALMIC
  Administered 2021-09-18: 1 [drp] via OPHTHALMIC
  Administered 2021-09-18 (×2): 2 [drp] via OPHTHALMIC
  Administered 2021-09-19 – 2021-09-20 (×4): 1 [drp] via OPHTHALMIC
  Administered 2021-09-20 – 2021-09-24 (×17): 2 [drp] via OPHTHALMIC
  Administered 2021-09-25 (×2): 1 [drp] via OPHTHALMIC
  Administered 2021-09-25: 2 [drp] via OPHTHALMIC
  Administered 2021-09-26: 1 [drp] via OPHTHALMIC
  Administered 2021-09-26 (×2): 2 [drp] via OPHTHALMIC
  Administered 2021-09-27: 1 [drp] via OPHTHALMIC
  Filled 2021-09-11: qty 15

## 2021-09-11 MED ORDER — TAMSULOSIN HCL 0.4 MG PO CAPS
0.4000 mg | ORAL_CAPSULE | Freq: Every day | ORAL | Status: DC
  Administered 2021-09-11 – 2021-09-12 (×2): 0.4 mg via ORAL
  Filled 2021-09-11 (×2): qty 1

## 2021-09-11 NOTE — Discharge Instructions (Addendum)
Inpatient Rehab Discharge Instructions  Maurice Little Discharge date and time: 09/27/21   Activities/Precautions/ Functional Status: Activity: no lifting, driving, or strenuous exercise till cleared by MD Diet: cardiac diet and diabetic diet Wound Care: keep wound clean and dry   Functional status:  ___ No restrictions     ___ Walk up steps independently _X__ 24/7 supervision/assistance   ___ Walk up steps with assistance ___ Intermittent supervision/assistance  ___ Bathe/dress independently ___ Walk with walker     _X__ Bathe/dress with assistance ___ Walk Independently    ___ Shower independently ___ Walk with assistance    ___ Shower with assistance _X__ No alcohol     ___ Return to work/school ________   COMMUNITY REFERRALS UPON DISCHARGE:    Outpatient: PT     OT                Agency:[facility]  Phone:[phone]              Appointment Date/Time: TBD After Discharge    Medical Equipment/Items Ordered: Wheelchair,                                                  Agency/Supplier: [employer]   Special Instructions: Need to patch your eye at nights or when napping.  Lacrilube is available over the counter.  3. Check your insurance card to make check and see if your primary care provider needs to send in a referral to specialists (before your urology appointment).    STROKE/TIA DISCHARGE INSTRUCTIONS SMOKING Cigarette smoking nearly doubles your risk of having a stroke & is the single most alterable risk factor  If you smoke or have smoked in the last 12 months, you are advised to quit smoking for your health. Most of the excess cardiovascular risk related to smoking disappears within a year of stopping. Ask you doctor about anti-smoking medications [place] Quit Line: 1-800-QUIT NOW Free Smoking Cessation Classes [phone]  CHOLESTEROL Know your levels; limit fat & cholesterol in your diet  Lipid Panel     Component Value Date/Time   CHOL 288 (H) 08/29/2021  2340   TRIG 82 08/29/2021 2340   HDL 52 08/29/2021 2340   CHOLHDL 5.5 08/29/2021 2340   VLDL 16 08/29/2021 2340   LDLCALC 220 (H) 08/29/2021 2340     Many patients benefit from treatment even if their cholesterol is at goal. Goal: Total Cholesterol (CHOL) less than 160 Goal:  Triglycerides (TRIG) less than 150 Goal:  HDL greater than 40 Goal:  LDL (LDLCALC) less than 100   BLOOD PRESSURE American Stroke Association blood pressure target is less that 120/80 mm/Hg  Your discharge blood pressure is:  BP: 121/82 Monitor your blood pressure Limit your salt and alcohol intake Many individuals will require more than one medication for high blood pressure  DIABETES (A1c is a blood sugar average for last 3 months) Goal HGBA1c is under 7% (HBGA1c is blood sugar average for last 3 months)  Diabetes: No known diagnosis of diabetes    Lab Results  Component Value Date   HGBA1C 5.5 08/29/2021    Your HGBA1c can be lowered with medications, healthy diet, and exercise. Check your blood sugar as directed by your physician Call your physician if you experience unexplained or low blood sugars.  PHYSICAL ACTIVITY/REHABILITATION Goal is 30 minutes at least  4 days per week  Activity: No driving, Therapies: see above Return to work: N/A Activity decreases your risk of heart attack and stroke and makes your heart stronger.  It helps control your weight and blood pressure; helps you relax and can improve your mood. Participate in a regular exercise program. Talk with your doctor about the best form of exercise for you (dancing, walking, swimming, cycling).  DIET/WEIGHT Goal is to maintain a healthy weight  Your discharge diet is:  Diet Order             Diet Heart Room service appropriate? Yes; Fluid consistency: Thin  Diet effective now                   liquids Your height is:  Height: 6' (182.9 cm) Your current weight is: Weight: 94.9 kg Your Body Mass Index (BMI) is:  BMI (Calculated):  28.37 Following the type of diet specifically designed for you will help prevent another stroke. Your goal weight range is:   Your goal Body Mass Index (BMI) is 19-24. Healthy food habits can help reduce 3 risk factors for stroke:  High cholesterol, hypertension, and excess weight.  RESOURCES Stroke/Support Group:  Call [phone]   STROKE EDUCATION PROVIDED/REVIEWED AND GIVEN TO PATIENT Stroke warning signs and symptoms How to activate emergency medical system (call 911). Medications prescribed at discharge. Need for follow-up after discharge. Personal risk factors for stroke. Pneumonia vaccine given:  Flu vaccine given:  My questions have been answered, the writing is legible, and I understand these instructions.  I will adhere to these goals & educational materials that have been provided to me after my discharge from the hospital.      My questions have been answered and I understand these instructions. I will adhere to these goals and the provided educational materials after my discharge from the hospital.  Patient/Caregiver Signature _______________________________ Date __________  Clinician Signature _______________________________________ Date __________  Please bring this form and your medication list with you to all your follow-up doctor's appointments.

## 2021-09-11 NOTE — Progress Notes (Signed)
Physical Therapy Session Note  Patient Details  Name: Maurice Little MRN: [id number] Date of Birth: January 25, 1956  Today's Date: 09/11/2021 PT Individual Time: 2458-0998 PT Individual Time Calculation (min): 55 min   Short Term Goals: Week 1:  PT Short Term Goal 1 (Week 1): Pt will perform supine<>sit with supervision PT Short Term Goal 2 (Week 1): Pt will perform sit<>stands with mod assist of 1 consistently PT Short Term Goal 3 (Week 1): Pt will perform bed<>chair transfers with mod assist of 1 consistently PT Short Term Goal 4 (Week 1): Pt will ambulate at least 67ft using LRAD with +2 mod assist  Skilled Therapeutic Interventions/Progress Updates:    Patient received reclined in bed, agreeable to PT. He denies pain, but endorses fatigue. PT donning sneakers TotalA for time management. He was able to come sit edge of bed with supervision and HOB elevated. Stand pivot to wc with no AD and ModA. PT transporting patient in wc to therapy gym for time management and energy conservation. Patient improving ability to modulate power and is able to stand up much slower and with increased control. He ambulated the following distances on the treadmill with Litegait: 2'10" for 137ft at 0. and 1'25" for 52ft at 0.26mph. Patient able to use white dividing line to help prevent scissoring or excessive NBOS and the border of the treadmill to prevent excessively WBOS. Patient with poor engagement of L lateral hip stabilizers resulting in excessive and rapid weight shift R when advancing R LE, which then led to general trunk instability. Patient was able to take reciprocal steps that were of relatively equal step length. Patient reporting significant fatigue after each bout of walking and required extended seated rest break between bouts. He would benefit from continuing high intensity gait training to aid in neuro return. Patient returning to room in wc, transferring to bed via stand pivot with ModA. Returning supine  with supervision. Call light within reach, family and RN at bedside.   Therapy Documentation Precautions:  Precautions Precautions: Fall Precaution Comments: diplopia, Ataxic R; L side numbness Restrictions Weight Bearing Restrictions: No   Therapy/Group: Individual Therapy  Elizebeth Koller, PT, DPT, CBIS  09/11/2021, 11:04 AM

## 2021-09-11 NOTE — Progress Notes (Signed)
Speech Language Pathology Daily Session Note  Patient Details  Name: Maurice Little MRN: [id number] Date of Birth: 1956/01/24  Today's Date: 09/11/2021 SLP Individual Time: 0930-1015  SLP Individual Time Calculation (min): 45 minutes   Short Term Goals: Week 1: SLP Short Term Goal 1 (Week 1): Patient will implement speech intelligiblity strategies at sentence level with min A verbal cues. SLP Short Term Goal 2 (Week 1): Patient will consume current diet with minimal-to-no overt s/sx of aspiration and sup A for implementation of swallowing safety SLP Short Term Goal 3 (Week 1): Patient will participate in further cognitive-linguistic evaluation to further identify/manage functional needs  Skilled Therapeutic Interventions: Pt received upright in wheelchair and agreeable to skilled ST intervention with focus on swallowing and speech goals. SLP facilitated skilled assessment on method of thin liquid consumption (cup sips vs. straw sips). Pt consumed single sips of thin liquid by cup with slight anterior labial spillage on left side and without overt s/sx of aspiration. He consumed via straw sips with improved labial seal and without overt s/sx of aspiration, though increased difficulty managing sip size. Pt consumed thin liquids through larger than average straw from water bottle from home which resulted in decreased suction and delayed throat clearing. Recommend straw precautions be lifted at this time, meaning allow pt to consume liquids via both cup and straw. SLP reinforced safe swallowing precautions including small bolus volume, small/single sips, and slow rate of consumption. Spouse and pt verbalized understanding through teach back. Pt perceived as 100% intelligible on this date with implementation of speech intelligibility strategies with mod I. Continue speech therapy for additional education x1-2 prior to discharge. Patient was left in chair with alarm activated and immediate needs within reach at  end of session. Continue per current plan of care.       Pain Pain Assessment Pain Scale: Faces Pain Score: 0-No pain  Therapy/Group: Individual Therapy  Elizibeth Breau T Kushal Saunders 09/11/2021, 10:18 AM

## 2021-09-11 NOTE — Progress Notes (Signed)
Physical Therapy Session Note  Patient Details  Name: Maurice Little MRN: [id number] Date of Birth: 29-Jan-1956  Today's Date: 09/11/2021 PT Individual Time: 1125-1211 PT Individual Time Calculation (min): 46 min   Short Term Goals: Week 1:  PT Short Term Goal 1 (Week 1): Pt will perform supine<>sit with supervision PT Short Term Goal 2 (Week 1): Pt will perform sit<>stands with mod assist of 1 consistently PT Short Term Goal 3 (Week 1): Pt will perform bed<>chair transfers with mod assist of 1 consistently PT Short Term Goal 4 (Week 1): Pt will ambulate at least 52ft using LRAD with +2 mod assist  Skilled Therapeutic Interventions/Progress Updates:    Pt received in TIS w/c with his wife, Scherry Ran, present and pt agreeable to therapy session.  Transported to/from gym in w/c for time management and energy conservation. Pt already wearing thigh high TED hose and abdominal binder donned. Vitals: BP 155/99 (MAP 117), HR 87bpm, SpO2 100% R squat pivot to EOM with light min assist for balance. Sit<>stand to/from EOM with heavy mod assist for balance due to strong R anterior lean/LOB and pt demoing overall increased postural sway. Donned maxi-sky harness. Reassessed vitals after standing: BP 155/97 (MAP 111), HR 95bpm, SpO2 100%. Gait training trials in maxi-sky harness providing slight support for safety as follows: - 51ft with heavy max assist for balance due to heavy postural sway in all directions though predominantly towards R in both anterior and posterior direction with wide BOS and uncoordinated R LE movements with high steppage gait pattern and heavy foot landing - when turning around pt has strong anterior LOB requiring heavy max assist to maintain balance while pt repositions feet underneath his BOS Reassessed vitals: BP 158/97 (MAP 113), HR 105bpm, SpO2 100%  - 8ft progressed towards heavy mod assist - continues to have LOB as described above  - 61ft lighter mod assist though with continued R LOB   - 29ft as just described Pt requires seated rest breaks between each gait trail reporting it is exhausting although pt very motivated to continue working hard.  Doffed maxi-sky harness in sitting.  Repeated sit<>stands x5 to/from EOM with B HHA as needed and varying heavy mod to heavy min assist for balance due to R lean and impaired force regulation - cuing for slower, more controled movements. L stand pivot to TIS w/c with heavy mod assist for balance due to strong postural sway/whole body ataxia. Transported back to room and pt requesting to return to bed. L squat pivot min assist. Doffed tennis shoes with supervision for sitting balance. Doffed abdominal binder. Sit>supine with supervision. Pt left supine in bed, HOB elevated, bed alarm on, needs in reach, and his wife present.  Therapy Documentation Precautions:  Precautions Precautions: Fall Precaution Comments: diplopia, Ataxic R; L side numbness Restrictions Weight Bearing Restrictions: No   Pain: No reports of pain throughout session.    Therapy/Group: Individual Therapy  Ginny Forth , PT, DPT, NCS, CSRS  09/11/2021, 8:01 AM

## 2021-09-11 NOTE — Progress Notes (Signed)
PROGRESS NOTE   Subjective/Complaints: Discussed dizziness, has this all the time reviewed therapy notes no orthostatic drops in fact standing was increased vs sitting  Labs reviewed with pt and wife  ROS: Patient denies CP, SOB, N/V/D   Objective:   No results found. Recent Labs    09/10/21 0526 09/11/21 0512  WBC 7.2 8.3  HGB 12.0* 12.9*  HCT 35.6* 37.5*  PLT 290 277    Recent Labs    09/10/21 0526 09/11/21 0512  NA 132* 135  K 3.9 4.0  CL 102 103  CO2 23 21*  GLUCOSE 98 106*  BUN 13 15  CREATININE 0.82 0.80  CALCIUM 8.7* 8.8*     Intake/Output Summary (Last 24 hours) at 09/11/2021 0742 Last data filed at 09/11/2021 0539 Gross per 24 hour  Intake 716 ml  Output 800 ml  Net -84 ml         Physical Exam: Vital Signs Blood pressure (!) 153/92, pulse 96, temperature 98 F (36.7 C), resp. rate 17, height 6' (1.829 m), SpO2 100 %.   General: No acute distress Mood and affect are appropriate Heart: Regular rate and rhythm no rubs murmurs or extra sounds Lungs: Clear to auscultation, breathing unlabored, no rales or wheezes Abdomen: Positive bowel sounds, soft nontender to palpation, nondistended Extremities: No clubbing, cyanosis, or edema   Musculoskeletal:     Cervical back: Normal range of motion. No rigidity.     Comments: 5/5 in RUE/RLE/LUE/LLE  Skin:    Comments: R hand IV- look OK No skin breakdown seen  Neurological:     Mental Status: He is alert and oriented to person, place, and time.     Comments: Right facial weakness with flat,dysarthric speech. Unable to close right eye.  Ataxia with finger to nose Left>Right as well as decrease in LLE motor control. Sensory deficits left face, LUE and LLE.  Dysarthric. Fair processing.   Assessment/Plan: 1. Functional deficits which require 3+ hours per day of interdisciplinary therapy in a comprehensive inpatient rehab setting. Physiatrist  is providing close team supervision and 24 hour management of active medical problems listed below. Physiatrist and rehab team continue to assess barriers to discharge/monitor patient progress toward functional and medical goals  Care Tool:  Bathing    Body parts bathed by patient: Right arm, Left arm, Chest, Abdomen, Right upper leg, Left upper leg, Right lower leg, Left lower leg, Face, Front perineal area, Buttocks   Body parts bathed by helper: Front perineal area, Buttocks     Bathing assist Assist Level: Minimal Assistance - Patient > 75%     Upper Body Dressing/Undressing Upper body dressing   What is the patient wearing?: Pull over shirt    Upper body assist Assist Level: Supervision/Verbal cueing    Lower Body Dressing/Undressing Lower body dressing      What is the patient wearing?: Pants, Incontinence brief     Lower body assist Assist for lower body dressing: Moderate Assistance - Patient 50 - 74%     Toileting Toileting Toileting Activity did not occur (Clothing management and hygiene only): N/A (no void or bm) (foley in place)  Toileting assist Assist for toileting:  Maximal Assistance - Patient 25 - 49%     Transfers Chair/bed transfer  Transfers assist     Chair/bed transfer assist level: Moderate Assistance - Patient 50 - 74% (squat pivot)     Locomotion Ambulation   Ambulation assist   Ambulation activity did not occur: Safety/medical concerns (requires +2 and skilled assist with use of hallway rail)          Walk 10 feet activity   Assist  Walk 10 feet activity did not occur: Safety/medical concerns        Walk 50 feet activity   Assist Walk 50 feet with 2 turns activity did not occur: Safety/medical concerns         Walk 150 feet activity   Assist Walk 150 feet activity did not occur: Safety/medical concerns         Walk 10 feet on uneven surface  activity   Assist Walk 10 feet on uneven surfaces activity did  not occur: Safety/medical concerns         Wheelchair     Assist Is the patient using a wheelchair?: Yes   Wheelchair activity did not occur: Safety/medical concerns         Wheelchair 50 feet with 2 turns activity    Assist            Wheelchair 150 feet activity     Assist          Blood pressure (!) 153/92, pulse 96, temperature 98 F (36.7 C), resp. rate 17, height 6' (1.829 m), SpO2 100 %.    Medical Problem List and Plan: 1.  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  Antithrombotics: -DVT/anticoagulation:  Pharmaceutical: Lovenox added.              -antiplatelet therapy: ASA/Brilinta. 3. Pain Management: Tylenol prn for HA.  4. Mood: LCSW to follow for evaluation and support.              -antipsychotic agents: N/A 5. Neuropsych: This patient is capable of making decisions on his own behalf. 6. Skin/Wound Care: Routine pressure-relief measures. 7. Fluids/Electrolytes/Nutrition: Monitor I's/O.  Check CMet in AM 8.  HTN with new hypotension/orthostaitc: Monitor BP TID.  Orthostatic symptoms reported today. --On hydralazine 25 mg 3 times daily, Cozaar 50 mg twice daily and amlodipine 10 mg daily.  11/9- orthostatic today- decreased losartan to 25 mg BID and decreased Norvasc to 5 mg daily- will add TEDs and ACE wraps and see how pt does.  --We will check orthostatic vital signs.  11/11- BP drops to 70s/50s per pt/wife and dizzy- will decrease hydralazine to 10 mg TID (BP is running in bed 150s/160s/systolic) will try Midodrine 2.5 mg with breakfast/lunch- advised to sit up after midodrine for 4 hours.  11/13- BP dropped to 100s yesterday -continue to hold Losartan for now Vitals:   09/10/21 1945 09/11/21 0533  BP: (!) 154/95 (!) 153/92   Pulse: 86 96  Resp: 17 17  Temp: 98.2 F (36.8 C) 98 F (36.7 C)  SpO2: 97% 100%   BPs running a bit high no orthostatic changes with OT session 9am, will d/c proamatine  9.  Urinary retention: Likely exacerbated by constipation. Augment bowel program and remove foley in am.  --Continue Flomax--changed to bedtime to avoid orthostatic symptoms. 11/9- increased Flomax to 0.8 mg nightly, however will check U/A and Cx due to elevated WBC and retention. 11/13 Foley remains.  -ua,ucx neg  -consider voiding trial again monday 10. Constipation: Will start patient on Senna S daily.  --MOM today.     11/13--repeat sorbitol today 11.  Leukocytosis: Resolved 12.  Intermittent hypokalemia: Recheck CMET/Mg level in am.    --May need standing dose supplement is on hydralazine. 11/11- K+ 4.8- could be  hemolysis vs supplement-  13.  Hyperglycemia: Likely stress related as Hemoglobin A1c 5.5. 14.  Dyslipidemia: Continue Lipitor. 15. 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 16. Unable to close right eye lid. Peripheral 7 Right   -ordered eye patch right eye  -eye drops. May keep at bedside     LOS: 7 days A FACE TO FACE EVALUATION WAS PERFORMED  Erick Colace 09/11/2021, 7:42 AM

## 2021-09-11 NOTE — Progress Notes (Signed)
Occupational Therapy Weekly Progress Note  Patient Details  Name: Maurice Little MRN: [id number] Date of Birth: 06-Feb-1956  Beginning of progress report period: September 05, 2021 End of progress report period: September 11, 2021  Today's Date: 09/11/2021 OT Individual Time: 0800-0902 OT Individual Time Calculation (min): 62 min    Patient has met 2 of 4 short term goals.  Maurice Little is making steady progress with OT at this time.  He currently completes UB selfcare at supervision level in supported sitting with min assist for LB bathing and mod assist for LB dressing sit to stand.  He continues to demonstrate moderate trunkial ataxia in sitting with dynamic tasks as well as severe ataxic movement in standing during transfers and mobility.  RUE ataxia is also still present with functional use, but he is able to integrate it into sessions at a diminished level during bathing and donning clothing.  Maurice Little still exhibits diplopia with near vision in the midline and right lateral fields with increased horizontal nystagmus noted with tracking in both eyes, but more severe in the right.  He tolerates left nasal occlusion taping for compensation of the diplopia during functional tasks.  Transfers are currently at a mod assist for stand pivot with max assist for short distance functional mobility with use of the RW for support.  He continues to report dizziness not related to hypotension which maintains at a 3/10 in supine or static sitting, increasing to a 6-7/10 with standing.  Feel overall he is making steady progress but will benefit from continued CIR level therapy to reach established min guard to supervision level goals to discharge home with spouse.  Will continue with education as appropriate in treatment sessions.  Patient continues to demonstrate the following deficits: muscle weakness and muscle paralysis, impaired timing and sequencing, unbalanced muscle activation, ataxia, decreased coordination,  and decreased motor planning, decreased visual motor skills, central origin, and decreased sitting balance, decreased standing balance, decreased postural control, hemiplegia, and decreased balance strategies and therefore will continue to benefit from skilled OT intervention to enhance overall performance with BADL, iADL, and Reduce care partner burden.  Patient progressing toward long term goals..  Continue plan of care.  OT Short Term Goals Week 2:  OT Short Term Goal 1 (Week 2): Pt will complete toilet transfers with min assist sit to stand. OT Short Term Goal 2 (Week 2): Pt will maintain dynamic sitting balance at min guard assist during selfcare tasks sitting unsupported. OT Short Term Goal 3 (Week 2): Pt will complete LB dressing with min assist for two consecutive sessions.  Skilled Therapeutic Interventions/Progress Updates:    Pt in bed to start session eager to begin and complete ADL.  He was able to transfer to the EOB with supervision with BP tested at 149/99.  He reports dizziness at 3/10 in supine and with sitting.  He completed functional mobility to the tub bench with use of the RW and max assist.  Severe ataxia noted with decreased ability to grade his speed and step length.  Once in the shower, he was able to complete all undressing at min assist level sit to stand.  He then completed bathing at min assist level sit to stand.  He was able to perform transfer out of the bathroom with max assist and no device to the bed.  He was then able to complete dressing sit to stand from the EOB with supervision for UB and mod assist for LB.  Increased ataxia noted when  attempting to place the RLE into the brief and pants.  Min instructional cueing to put the RUE in first.  He was able to donn and tie his shoes with min guard assist as well after therapist donned his TEDs at total assist.  Supervision for donning pullover shirt with transfer to the wheelchair at mod assist level to complete session.   He was left with his spouse to complete grooming tasks that were not completed earlier.  Call button and phone in reach with safety alarm belt in place.    Therapy Documentation Precautions:  Precautions Precautions: Fall Precaution Comments: diplopia, Ataxic R; L side numbness Restrictions Weight Bearing Restrictions: No  Pain: Pain Assessment Pain Scale: Faces Pain Score: 0-No pain ADL: See Care Tool Section for some details of mobility and selfcare   Therapy/Group: Individual Therapy  Jasmeet Gehl OTR/L 09/11/2021, 9:15 AM

## 2021-09-11 NOTE — Progress Notes (Signed)
Patient c/o having a film over right eye x2. This nurse applied eyedrops and assisted with removing it. PA pam made aware and warm compress ordered 4 times a day. No other concerns to report.

## 2021-09-12 DIAGNOSIS — I639 Cerebral infarction, unspecified: Secondary | ICD-10-CM | POA: Diagnosis not present

## 2021-09-12 MED ORDER — SORBITOL 70 % SOLN
45.0000 mL | Freq: Once | Status: AC
Start: 2021-09-12 — End: 2021-09-12
  Administered 2021-09-12: 45 mL via ORAL
  Filled 2021-09-12: qty 60

## 2021-09-12 NOTE — Progress Notes (Signed)
Physical Therapy Session Note  Patient Details  Name: Maurice Little MRN: [id number] Date of Birth: 03/16/1956  Today's Date: 09/12/2021 PT Individual Time: 2583-4621 PT Individual Time Calculation (min): 27 min   Short Term Goals: Week 1:  PT Short Term Goal 1 (Week 1): Pt will perform supine<>sit with supervision PT Short Term Goal 2 (Week 1): Pt will perform sit<>stands with mod assist of 1 consistently PT Short Term Goal 3 (Week 1): Pt will perform bed<>chair transfers with mod assist of 1 consistently PT Short Term Goal 4 (Week 1): Pt will ambulate at least 48ft using LRAD with +2 mod assist  Skilled Therapeutic Interventions/Progress Updates:    Patient received sitting up in wc, agreeable to PT. He denies pain, endorses fatigue. PT transporting patient in wc to therapy gym for time management and energy conservation. Blocked practice stand pivot transfers wc <> therapy mat with MinA and verbal cues for sequencing and hand placement. Patient with tendency toward R lateral lean with poor awareness of this. Patient reports that since L hemibody LT is decreased, he is unable to feel whether he was equal weight distribution. Mirror used for visual feedback on posture with L lateral lean to retrieve objects. Patient returning to room in wc, transferring to bed via stand pivot with MinA. Bed alarm on, call light within reach, wife at bedside.   Therapy Documentation Precautions:  Precautions Precautions: Fall Precaution Comments: diplopia, Ataxic R; L side numbness Restrictions Weight Bearing Restrictions: No    Therapy/Group: Individual Therapy  Elizebeth Koller, PT, DPT, CBIS  09/12/2021, 7:35 AM

## 2021-09-12 NOTE — Progress Notes (Signed)
Occupational Therapy Session Note  Patient Details  Name: Maurice Little MRN: [id number] Date of Birth: 01-10-1956  Today's Date: 09/12/2021 OT Individual Time: 0940-7680 OT Individual Time Calculation (min): 70 min    Short Term Goals: Week 2:  OT Short Term Goal 1 (Week 2): Pt will complete toilet transfers with min assist sit to stand. OT Short Term Goal 2 (Week 2): Pt will maintain dynamic sitting balance at min guard assist during selfcare tasks sitting unsupported. OT Short Term Goal 3 (Week 2): Pt will complete LB dressing with min assist for two consecutive sessions.  Skilled Therapeutic Interventions/Progress Updates:    Pt resting in w/c upon arrival with wife present. OT intervention with focus on FMC/GMC tasks seated and in standing. Pt donned gloves and cleaned bean bags using wipes. Pt grasped clothes pins while seated and placed on dowels. Pt stood at hi-lo table and removed all clothes pins from dowel and placed in container. Pt donned gloves again and stood at hi-lo table to clean clothes pins. Pt with slight lateral lean to Rt but able to self correct with verbal cue. Pt fatigued and completed task seated in w/c. Pt returned to room and remained in w/c with wife present. Belt alarm activated.   Therapy Documentation Precautions:  Precautions Precautions: Fall Precaution Comments: diplopia, Ataxic R; L side numbness Restrictions Weight Bearing Restrictions: No  Pain:  Pt denies pain this morning  Therapy/Group: Individual Therapy  Rich Brave 09/12/2021, 12:16 PM

## 2021-09-12 NOTE — Plan of Care (Addendum)
Problem: RH Expression Communication Goal: LTG Patient will increase speech intelligibility (SLP) Description: LTG: Patient will increase speech intelligibility at word/phrase/conversation level with cues, % of the time (SLP) Outcome: Completed/Met   Problem: RH Swallowing Goal: LTG Patient will consume least restrictive diet using compensatory strategies with assistance (SLP) Description: LTG:  Patient will consume least restrictive diet using compensatory strategies with assistance (SLP) Outcome: Completed/Met

## 2021-09-12 NOTE — Patient Care Conference (Signed)
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 Alysia Penna Social Worker Present: Erlene Quan, BSW Nurse Present: Dorien Chihuahua, RN PT Present: Page Spiro, PT OT Present: Turner Daniels, OT SLP Present: Sherren Kerns, SLP PPS Coordinator present : Ileana Ladd, PT     Current Status/Progress Goal Weekly Team Focus  Bowel/Bladder   Pt is cont of bowel; Has a foley for retention.  pt to gain cont of bladder and remain cont of Bowel  Toilet pt as needed   Swallow/Nutrition/ Hydration   Regular diet, thin liquids  mod I  monitoring consumption with cup sips vs. straw sips. Pt mod I   ADL's   Supervision for UB selfcare with min assist for LB selfcare sit to stand.  Mod assist for stand pivot transfers secondary to ataxia.  Decreased RUE coordination secondary to ataxia but uses it spontaneously with selfcare tasks at a diminshed level, including tying his shoes.  Still with diplopia in the midline and right visual fields.  Tolerating taping of his glasses to help compensate for this.  contact guard overall  selfcare retraining, neuromuscular re-education, balance retraining, transfer training, DME education, therapeutic exercise, vision retraining, pt/family education   Mobility   CGA bed mobility using bedrails, min assist squat pivot, mod assist sit<>stand and heavy mod assist stand pivot transfers,  requires +2 mod assist or use of harness system for safe gait training trials up to 49ft due to strong postural sway/ataxia, impaired midline orientation, and R LE ataxia  CGA overall at ambulatory level  bed mobility, transfer training, sitting and standing balance, activity tolerance, dynamic gait training, pt/family education, R hemibody NMR   Communication   mod I  mod I  education and d/c. Pt at goal level   Safety/Cognition/ Behavioral Observations  WFL. Not addressing         Pain   Pt denies pain  Pt remain pain free  Assess for pain qshift and provide PRN medications or relief methods as needed   Skin   Pt skin is clean and intact  Pt skin remain free of injury  Skin care qshift     Discharge Planning:  Discharging home with spouse   Team Discussion: Patient with cranial nerve palsy and ortho stasis post stroke with diplopia. Eyeglass taping has helped. Patient also has a posterior sway and mid line orientation deficit with ataxia. Patient on target to meet rehab goals: yes, currently supervision for upper body care and min assist for lower body care. Mod assist for stand pivot due to ataxia and min assist for squat pivot transfers. Goals for discharge set for CGA overall.  *See Care Plan and progress notes for long and short-term goals.   Revisions to Treatment Plan:  Remove foley and start voiding trial with po medication on board Discontinue SLP services; has met goals  Teaching Needs: Safety, medication, secondary risk management, transfers, toileting, etc  Current Barriers to Discharge: Decreased caregiver support  Possible Resolutions to Barriers: Family education with wife HEP for SLP recommendations     Medical Summary Current Status: urinary retention ready for another voiding trial, orthostasis improved, swallowing well, dysarthria improving  Barriers to Discharge: Medical stability   Possible Resolutions to Barriers/Weekly Focus: focus on ataxia, may  reduce TEDs to knee, may d/c speech   Continued Need for Acute Rehabilitation Level of Care: The patient requires daily medical management by a physician with specialized training in physical medicine and rehabilitation for the following reasons: Direction of a multidisciplinary physical rehabilitation program to maximize functional independence : Yes Medical management of patient stability for increased activity during participation in an intensive rehabilitation regime.: Yes Analysis of laboratory values and/or radiology reports with any subsequent need for medication adjustment and/or medical intervention. : Yes   I attest that I was present, lead the team conference, and concur with the assessment and plan of the team.   Dorien Chihuahua B 09/12/2021, 1:24 PM

## 2021-09-12 NOTE — Progress Notes (Signed)
Patient ID: Maurice Little, male   DOB: [birth year], 66 y.o.   MRN: [id number] Team Conference Report to Patient/Family  Team Conference discussion was reviewed with the patient and caregiver, including goals, any changes in plan of care and target discharge date.  Patient and caregiver express understanding and are in agreement.  The patient has a target discharge date of 09/27/21.  SW met with patient and spouse and provided conference updates. Patient foley will be d/c today. Dietician visited patient. SW will provide PA with pt disability paperwork now that a d/c date has been established. No additional questions or concerns.  Dyanne Iha 09/12/2021, 2:10 PM

## 2021-09-12 NOTE — Progress Notes (Signed)
Physical Therapy Weekly Progress Note  Patient Details  Name: Maurice Little MRN: [id number] Date of Birth: 05-Dec-1955  Beginning of progress report period: September 05, 2021 End of progress report period: September 12, 2021  Today's Date: 09/12/2021 PT Individual Time: 0805-0902 PT Individual Time Calculation (min): 57 min   Patient has met 3 of 4 short term goals. Maurice Little was limited in the beginning of his CIR stay due to new findings on his CT and orthostatic hypotension, but has now been able to have increased participation resulting in improving functional mobility level. He is performing supine<>sit with supervision using bed features, squat pivot transfers min assist, sit<>stands mod assist, stand pivot transfers with mod/max assist, and progressed to gait training up to 31f with heavy +2 mod assist. He has also been participating in high intensity gait training using the treadmill. Pt continues to have significant visual impairments with diplopia, significant truncal and R hemibody ataxia resulting in large postural sways in standing with impaired balance recovery strategies, as well as impaired L hemibody sensation with all of this contributing to poor midline orientation and requiring increased assist for any standing functional mobility task. Pt is very determined with excellent participation in therapy and has good family support. He will benefit from continued CIR level therapies to further progress his independence with functional mobility prior to D/C home with 24hr support from family on anticipated date of 12/1.   Patient continues to demonstrate the following deficits muscle weakness, decreased cardiorespiratoy endurance, impaired timing and sequencing, abnormal tone, unbalanced muscle activation, ataxia, and decreased coordination, decreased visual acuity, decreased visual perceptual skills, and decreased visual motor skills, decreased midline orientation, and decreased sitting balance,  decreased standing balance, decreased postural control, and decreased balance strategies and therefore will continue to benefit from skilled PT intervention to increase functional independence with mobility.  Patient progressing toward long term goals..  Continue plan of care.  PT Short Term Goals Week 1:  PT Short Term Goal 1 (Week 1): Pt will perform supine<>sit with supervision PT Short Term Goal 1 - Progress (Week 1): Progressing toward goal PT Short Term Goal 2 (Week 1): Pt will perform sit<>stands with mod assist of 1 consistently PT Short Term Goal 2 - Progress (Week 1): Met PT Short Term Goal 3 (Week 1): Pt will perform bed<>chair transfers with mod assist of 1 consistently PT Short Term Goal 3 - Progress (Week 1): Met PT Short Term Goal 4 (Week 1): Pt will ambulate at least 534fusing LRAD with +2 mod assist PT Short Term Goal 4 - Progress (Week 1): Met Week 2:  PT Short Term Goal 1 (Week 2): Pt will perform sit<>stands with min assist using LRAD consistently PT Short Term Goal 2 (Week 2): Pt will perform stand pivot transfers using LRAD with mod assist consistently PT Short Term Goal 3 (Week 2): Pt will ambulate at least 5031fsing LRAD with mod assist of 1 PT Short Term Goal 4 (Week 2): Pt will initiate stair training  Skilled Therapeutic Interventions/Progress Updates:  Ambulation/gait training;Community reintegration;DME/adaptive equipment instruction;Neuromuscular re-education;Psychosocial support;Stair training;UE/LE Strength taining/ROM;Wheelchair propulsion/positioning;Balance/vestibular training;Discharge planning;Functional electrical stimulation;Pain management;Skin care/wound management;Therapeutic Activities;UE/LE Coordination activities;Cognitive remediation/compensation;Disease management/prevention;Functional mobility training;Patient/family education;Splinting/orthotics;Therapeutic Exercise;Visual/perceptual remediation/compensation   Pt received supine in bed, HOB  elevated, with his wife present and pt agreeable to therapy session. Pt already wearing thigh high TED hose. Supine>sitting L EOB with close supervision for safety. R squat pivot EOB>TIS w/c light min assist for safety while pivoting.  Transported to/from  gym in w/c for time management and energy conservation.   Sitting vitals: BP 157/107 (MAP 122), HR 89bpm, SpO2 100%   Sit>stand to B UE support on litegait with min assist for balance - able to use UE support to decrease postural sway. Standing with CGA donned litegait harness with +2 assist for safety.  Reassessed vitals after standing: BP 131/94 (MAP 103), HR 115bpm - no reports of symptoms  In litegait harness for safety stepped forward onto treadmill with +2 assist to manage litegait and maintain pt safety - pt able to step LEs without manual facilitation.  Participated in the following high intensity gait training tasks on treadmill with seated rests between each - no true BWS provided but did have harness supporting him for balance, using B UE support throughout: 566mn at 0.7766m increased to 0.66m51mtotaling 142f53fpt able to advance each LE without assist - demos excessive R foot clearance during swing with slight R foot inversion/supination but not at risk for rolling ankle at this time - cuing for increased L step length to promote longer R stance time - vitals after: BP 139/111 (MAP 119), HR 138bpm that decreased to 120bpm then 112bpm after 2min666mst with BP increasing to 153/116 (MAP 128)  Pulled stockings down to knee high - 2min 88m0.66mph t32mling 156ft - 57finued cuing as above with pt demoing improved ability to maintain a consistent step width - pt continues to rely heavily on B UE support on litegait bars for balance - vitals: BP 155/113 (MAP 123), HR 145bpm that improved to 129bpm after 1 min rest then 118bpm after 4 minutes and BP 136/118 (MAP 124) 2min at 366mmph tota70mg 174ft - vit62f BP 152/105 (MAP 118), HR 148bpm improving to  134bpm within 1 minute then 125bpm after 3 minute rest with BP 147/106 (MAP 119)   While still in litegait harness for safety and +2 assist stepped back off of treadmill. Standing with CGA and B UE support doffed harness. Transported back to room and pt agreeable to remain sitting in TIS w/c. Pt left with needs in reach, seat belt alarm on, and his wife present.   Therapy Documentation Precautions:  Precautions Precautions: Fall Precaution Comments: diplopia, Ataxic R; L side numbness Restrictions Weight Bearing Restrictions: No   Pain:   No reports of pain throughout session.   Therapy/Group: Individual Therapy  Dondra Rhett M PipTawana Scale NCS, CSRS 09/12/2021, 7:47 AM

## 2021-09-12 NOTE — Progress Notes (Signed)
PROGRESS NOTE   Subjective/Complaints: BP maintained without orthostatic drops in PT off midodrine   ROS: Patient denies CP, SOB, N/V/D   Objective:   No results found. Recent Labs    09/10/21 0526 09/11/21 0512  WBC 7.2 8.3  HGB 12.0* 12.9*  HCT 35.6* 37.5*  PLT 290 277    Recent Labs    09/10/21 0526 09/11/21 0512  NA 132* 135  K 3.9 4.0  CL 102 103  CO2 23 21*  GLUCOSE 98 106*  BUN 13 15  CREATININE 0.82 0.80  CALCIUM 8.7* 8.8*     Intake/Output Summary (Last 24 hours) at 09/12/2021 0718 Last data filed at 09/12/2021 0504 Gross per 24 hour  Intake 120 ml  Output 800 ml  Net -680 ml         Physical Exam: Vital Signs Blood pressure (!) 152/89, pulse 86, temperature 98.4 F (36.9 C), resp. rate 16, height 6' (1.829 m), SpO2 99 %.   General: No acute distress Mood and affect are appropriate Heart: Regular rate and rhythm no rubs murmurs or extra sounds Lungs: Clear to auscultation, breathing unlabored, no rales or wheezes Abdomen: Positive bowel sounds, soft nontender to palpation, nondistended Extremities: No clubbing, cyanosis, or edema   Musculoskeletal:     Cervical back: Normal range of motion. No rigidity.     Comments: 5/5 in RUE/RLE/LUE/LLE  Skin:    Comments: R hand IV- look OK No skin breakdown seen  Neurological:     Mental Status: He is alert and oriented to person, place, and time.     Comments: Right facial weakness with flat,dysarthric speech. Unable to close right eye.  Ataxia with finger to nose Left>Right as well as decrease in LLE motor control. Sensory deficits left face, LUE and LLE.  Dysarthric. Fair processing.   Assessment/Plan: 1. Functional deficits which require 3+ hours per day of interdisciplinary therapy in a comprehensive inpatient rehab setting. Physiatrist is providing close team supervision and 24 hour management of active medical problems listed  below. Physiatrist and rehab team continue to assess barriers to discharge/monitor patient progress toward functional and medical goals  Care Tool:  Bathing    Body parts bathed by patient: Right arm, Left arm, Chest, Abdomen, Right upper leg, Left upper leg, Right lower leg, Left lower leg, Face, Front perineal area, Buttocks   Body parts bathed by helper: Front perineal area, Buttocks     Bathing assist Assist Level: Minimal Assistance - Patient > 75%     Upper Body Dressing/Undressing Upper body dressing   What is the patient wearing?: Pull over shirt    Upper body assist Assist Level: Supervision/Verbal cueing    Lower Body Dressing/Undressing Lower body dressing      What is the patient wearing?: Pants, Incontinence brief     Lower body assist Assist for lower body dressing: Minimal Assistance - Patient > 75%     Toileting Toileting Toileting Activity did not occur (Clothing management and hygiene only): N/A (no void or bm) (foley in place)  Toileting assist Assist for toileting: Maximal Assistance - Patient 25 - 49%     Transfers Chair/bed transfer  Transfers assist  Chair/bed transfer assist level: Moderate Assistance - Patient 50 - 74% (stand pivot)     Locomotion Ambulation   Ambulation assist   Ambulation activity did not occur: Safety/medical concerns (requires +2 and skilled assist with use of hallway rail)  Assist level: Total Assistance - Patient < 25% Assistive device: Maxi Sky Max distance: 33ft   Walk 10 feet activity   Assist  Walk 10 feet activity did not occur: Safety/medical concerns        Walk 50 feet activity   Assist Walk 50 feet with 2 turns activity did not occur: Safety/medical concerns         Walk 150 feet activity   Assist Walk 150 feet activity did not occur: Safety/medical concerns         Walk 10 feet on uneven surface  activity   Assist Walk 10 feet on uneven surfaces activity did not occur:  Safety/medical concerns         Wheelchair     Assist Is the patient using a wheelchair?: Yes   Wheelchair activity did not occur: Safety/medical concerns         Wheelchair 50 feet with 2 turns activity    Assist            Wheelchair 150 feet activity     Assist          Blood pressure (!) 152/89, pulse 86, temperature 98.4 F (36.9 C), resp. rate 16, height 6' (1.829 m), SpO2 99 %.    Medical Problem List and Plan: 1.  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  Antithrombotics: -DVT/anticoagulation:  Pharmaceutical: Lovenox added.              -antiplatelet therapy: ASA/Brilinta. 3. Pain Management: Tylenol prn for HA.  4. Mood: LCSW to follow for evaluation and support.              -antipsychotic agents: N/A 5. Neuropsych: This patient is capable of making decisions on his own behalf. 6. Skin/Wound Care: Routine pressure-relief measures. 7. Fluids/Electrolytes/Nutrition: Monitor I's/O.  Check CMet in AM 8.  HTN with new hypotension/orthostaitc: Monitor BP TID.  Orthostatic symptoms reported today. --On hydralazine 25 mg 3 times daily, Cozaar 50 mg twice daily and amlodipine 10 mg daily.  11/9- orthostatic today- decreased losartan to 25 mg BID and decreased Norvasc to 5 mg daily- will add TEDs and ACE wraps and see how pt does.  --We will check orthostatic vital signs.  11/11- BP drops to 70s/50s per pt/wife and dizzy- will decrease hydralazine to 10 mg TID (BP is running in bed 150s/160s/systolic) will try Midodrine 2.5 mg with breakfast/lunch- advised to sit up after midodrine for 4 hours.   -continue to hold Losartan for now Vitals:    09/11/21 1957 09/12/21 0510  BP: (!) 162/72 (!) 152/89  Pulse: 90 86  Resp: 17 16  Temp: 98.7 F (37.1 C) 98.4 F (36.9 C)  SpO2: 98% 99%   BPs running a bit high no orthostatic changes with  PT session yesterday after d/c proamatine , if BP still high in am will increase amlodipine  9.  Urinary retention: Likely exacerbated by constipation. Augment bowel program and remove foley in am.  --Continue Flomax--changed to bedtime to avoid orthostatic symptoms. 11/9- increased Flomax to 0.8 mg nightly, however will check U/A and Cx due to elevated WBC and retention. 11/16 Flomax at 0.4mg  due to orthostasis, will do voiding trial today  10. Constipation: Will start patient on Senna S daily.  --MOM today.     11/13--repeat sorbitol today 11.  Leukocytosis: Resolved 12.  Intermittent hypokalemia: Recheck CMET/Mg level in am.    --May need standing dose supplement is on hydralazine. 11/11- K+ 4.8- could be  hemolysis vs supplement-  13.  Hyperglycemia: Likely stress related as Hemoglobin A1c 5.5. 14.  Dyslipidemia: Continue Lipitor. 15. 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 16. Unable to close right eye lid. Peripheral 7 Right - c/o "film over R eye" which is normal with lacrilube  -ordered eye patch right eye  -eye drops. May keep at bedside     LOS: 8 days A FACE TO FACE EVALUATION WAS PERFORMED  Erick Colace 09/12/2021, 7:18 AM

## 2021-09-12 NOTE — Progress Notes (Signed)
Speech Language Pathology Discharge Summary  Patient Details  Name: Maurice Little MRN: [id number] Date of Birth: [birth year]  Today's Date: 09/12/2021 SLP Individual Time: 0930-1000 SLP Individual Time Calculation (min): 30 min  Skilled Therapeutic Interventions: Pt received upright in wheelchair and agreeable to skilled ST intervention with focus on speech goals. Pt was at independent level for implementing speech intelligibility strategies at conversation level to achieve 100% intelligibility. He reported completing HEP last night including speech production of multisyllabic words and sentences via handout that was provided. Pt consumed single and sequential sips of thin liquids via cup and straw without overt s/sx of aspiration and clear vocal quality post swallows. Pt continues to report tolerance of regular textures and thin liquids. Spouse agreed. Pt has met all goals and appropriate to discharge from skilled ST services at this time. Pt and spouse educated and verbalized agreement. Patient was left in chair with alarm activated and immediate needs within reach at end of session.   Patient has met 2 of 2 long term goals.  Patient to discharge at overall Independent;Modified Independent level.  Reasons goals not met: N/A   Clinical Impression/Discharge Summary:  Patient has made excellent progress and has met 3 of 3 short-term goals and 2 of 2 long-term goals this admission due to improved speech intelligibility and PO tolerance. Pt consistently implements speech intelligibility strategies with independence to achieve 100% intelligibility at conversation level and is independent in implementing HEP with support from his spouse. Although pt continues to exhibit mild dysarthria, he has demonstrated generalization of skills and has achieved all goals for speech. Patient is currently tolerating a regular texture diet and thin liquids. Pt demonstrates safe consumption of thin liquids via straw and cup and  is independent at recalling and implementing safe swallowing strategies to maximize swallow safety and efficiency. Patient and family education is complete and patient to discharge at overall independent-to-mod I level from a speech therapy standpoint. Patient's care partner is independent to provide the necessary assistance at discharge. Follow up SLP services do not appear clinically indicated at this time. Both pt and spouse verbalized agreement and understanding.   Care Partner: Spouse Caregiver Able to Provide Assistance: Yes  Type of Caregiver Assistance: Cognitive  Recommendation:  None  Rationale for SLP Follow Up: Other (comment) (None)   Equipment: None   Reasons for discharge: Treatment goals met   Patient/Family Agrees with Progress Made and Goals Achieved: Yes    Timothy Trudell T Graeson [place] 09/12/2021, 12:41 PM

## 2021-09-13 DIAGNOSIS — I639 Cerebral infarction, unspecified: Secondary | ICD-10-CM | POA: Diagnosis not present

## 2021-09-13 MED ORDER — TAMSULOSIN HCL 0.4 MG PO CAPS
0.8000 mg | ORAL_CAPSULE | Freq: Every day | ORAL | Status: DC
  Administered 2021-09-13 – 2021-09-17 (×5): 0.8 mg via ORAL
  Filled 2021-09-13 (×5): qty 2

## 2021-09-13 MED ORDER — SORBITOL 70 % SOLN
400.0000 mL | TOPICAL_OIL | Freq: Once | ORAL | Status: DC
  Filled 2021-09-13: qty 120

## 2021-09-13 MED ORDER — BETHANECHOL CHLORIDE 10 MG PO TABS
10.0000 mg | ORAL_TABLET | Freq: Three times a day (TID) | ORAL | Status: DC
  Administered 2021-09-13 – 2021-09-17 (×13): 10 mg via ORAL
  Filled 2021-09-13 (×13): qty 1

## 2021-09-13 MED ORDER — SENNOSIDES-DOCUSATE SODIUM 8.6-50 MG PO TABS
2.0000 | ORAL_TABLET | Freq: Two times a day (BID) | ORAL | Status: DC
  Administered 2021-09-13 – 2021-09-27 (×29): 2 via ORAL
  Filled 2021-09-13 (×29): qty 2

## 2021-09-13 NOTE — Progress Notes (Signed)
Recreational Therapy Session Note  Patient Details  Name: Prayan Ulin MRN: [id number] Date of Birth: [birth year] Today's Date: 09/13/2021  Pain: no c/o Skilled Therapeutic Interventions/Progress Updates: Pt participated in stress managment/coping group today.  Pt education/discussion focused on stress exploration including factors that contribute to stress, factors that protect against stress and potential coping strategies.  Coping strategies included deep breathing, progressive muscle relaxation, imagery & challenging irrational thoughts.  Pt participatory in group discussion and handouts provided.    Therapy/Group: Group Therapy  Teryn Gust 09/13/2021, 4:07 PM

## 2021-09-13 NOTE — Progress Notes (Signed)
Physical Therapy Session Note  Patient Details  Name: Maurice Little MRN: [id number] Date of Birth: Oct 26, 1956  Today's Date: 09/13/2021 PT Individual Time: 1105-1202 PT Individual Time Calculation (min): 57 min   Short Term Goals: Week 2:  PT Short Term Goal 1 (Week 2): Pt will perform sit<>stands with min assist using LRAD consistently PT Short Term Goal 2 (Week 2): Pt will perform stand pivot transfers using LRAD with mod assist consistently PT Short Term Goal 3 (Week 2): Pt will ambulate at least 21ft using LRAD with mod assist of 1 PT Short Term Goal 4 (Week 2): Pt will initiate stair training  Skilled Therapeutic Interventions/Progress Updates:    Pt received supine in bed with Misty Stanley, Rec therapist, and his family present - pt agreeable to PT therapy session therefore this therapist assumed care of pt. Pt now wearing knee high TED hose. Supine>sitting L EOB, HOB partially elevated and using bedrail, supervision. R stand pivot EOB>TIS w/c with heavy mod assist for balance due to pt having increased R postural sway - manual facilitation for weight shifting and cuing for sequencing LE stepping.  Transported to/from gym in w/c for time management and energy conservation.   Baseline vitals at rest: BP 149/104 (MAP 116), HR 84bpm, SpO2 99%   Sit>stand TIS w/c>B UE support on litegait with min assist for balance. Standing with CGA for safety donned litegait harness with +2 assist for time and safety. While in litegait harness for safety stepped up onto treadmill with +2 assist to manage litegait.   Participated in the following high intensity gait training trials on treadmill with litegait harness on for safety but not providing any support but using B UE support:  62min08sec at 1.19mph totaling 189ft - continuing to utilize visual target to promote increased L LE step length, continued cuing for "soft" landing with R foot as pt has high steppage uncoordinated R swing phase (intermittently would kick  front of litegait due to excessively high step) also with excessive R foot/ankle inversion (not rolling it but definitely increased movement causing pt to then pronate foot/ankle heavily during stance) - therapist standing behind pt to facilitate R/L weight shift onto stance limb Vitals after: BP 177/95 (MAP 117), HR 107bpm, SpO2 99% - pt reports no symptoms other than some fatigue from feeling like he is "working hard" Reassessed after 4 minute seated break: BP 141/96 (MAP 109), HR 92bpm, SpO2 100%  82min09sec at 1.74mph totaling 162ft - continued cuing and manual facilitation as above Vitals after: BP 173/105 (MAP 125), HR 113bpm, SpO2 99% - pt reports no symptoms as stated above Vitals after seated rest: BP 153/101 (MAP 114), HR 95bpm, SpO2 99%   While in litegait harness for safety stepped back off of treadmill with +2 assist for safety and to manage litegait.  Gait training ~149ft overground while in litegait harness for safety (but not providing support) with BUE support on litegait bars and +2 assist directing litegait - therapist providing heavy mod with intermittent max assist for balance recovery due to pt having R LE scissoring causing worsening R LOB as well as poor L weight shift during L stance phase - max multimodal cuing throughout for improved R/L weight shifting. Vitals after: BP 146/104 (MAP 118), HR 115bpm, SpO2 100%   Standing with CGA and B UE support on litegait doffed harness with +2 assist for safety. Pt reports fatigue. Transported back to room. L stand pivot TIS w/c>EOB with mod assist for balance due to continued R  anterior trunk lean with poor midline orientation. Sitting EOB put on clean shirt with set-up assist and doffed shoes with close supervision for trunk control. Sit>supine supervision. Pt left supine in bed with needs in reach, bed alarm on, and his family present.  Therapy Documentation Precautions:  Precautions Precautions: Fall Precaution Comments:  diplopia, Ataxic R; L side numbness Restrictions Weight Bearing Restrictions: No   Pain:  Denies pain during session.    Therapy/Group: Individual Therapy  Ginny Forth , PT, DPT, NCS, CSRS  09/13/2021, 7:57 AM

## 2021-09-13 NOTE — Progress Notes (Signed)
Occupational Therapy Session Note  Patient Details  Name: Maurice Little MRN: [id number] Date of Birth: Feb 12, 1956  Today's Date: 09/13/2021 OT Group Time: 1410-1455 OT Group Time Calculation (min): 45 min   Short Term Goals: Week 2:  OT Short Term Goal 1 (Week 2): Pt will complete toilet transfers with min assist sit to stand. OT Short Term Goal 2 (Week 2): Pt will maintain dynamic sitting balance at min guard assist during selfcare tasks sitting unsupported. OT Short Term Goal 3 (Week 2): Pt will complete LB dressing with min assist for two consecutive sessions.  Skilled Therapeutic Interventions/Progress Updates:  Pt participated in group session with a focus on stress mgmt, education on healthy coping strategies, and social interaction. Focus of session on providing coping strategies to manage new current level of function as a result of new diagnosis.  Session focus on breaking down stressors into "daily hassles," "major life stressors" and "life circumstances" in an effort to allow pts to chunk their stressors into groups. Pt actively sharing stressors and contributing to group conversation. Provided active listening, emotional support and therapeutic use of self. Offered education on factors that protect [place] against stress such as "daily uplifts," "healthy coping strategies" and "protective factors." Encouraged all group members to make an effort to actively recall one event from their day that was a daily uplift in an effort to protect their mindset from stressors as well as sharing this information with their caregivers to facilitate improved caregiver communication and decrease overall burden of care.  Issued pt handouts on healthy coping strategies to implement into routine. Pt transported back to room by RT.  Therapy Documentation Precautions:  Precautions Precautions: Fall Precaution Comments: diplopia, Ataxic R; L side numbness Restrictions Weight Bearing Restrictions: No  Pain: Pt  reports no pain during session     Therapy/Group: Group Therapy  Barron Schmid 09/13/2021, 4:17 PM

## 2021-09-13 NOTE — Progress Notes (Signed)
PROGRESS NOTE   Subjective/Complaints: BP maintained without orthostatic drops in PT off midodrine   ROS: Patient denies CP, SOB, N/V/D   Objective:   No results found. Recent Labs    09/11/21 0512  WBC 8.3  HGB 12.9*  HCT 37.5*  PLT 277    Recent Labs    09/11/21 0512  NA 135  K 4.0  CL 103  CO2 21*  GLUCOSE 106*  BUN 15  CREATININE 0.80  CALCIUM 8.8*     Intake/Output Summary (Last 24 hours) at 09/13/2021 0728 Last data filed at 09/13/2021 0200 Gross per 24 hour  Intake 360 ml  Output 1500 ml  Net -1140 ml         Physical Exam: Vital Signs Blood pressure (!) 144/99, pulse 90, temperature 98.3 F (36.8 C), temperature source Oral, resp. rate 16, height 6' (1.829 m), SpO2 96 %.  General: No acute distress Mood and affect are appropriate Heart: Regular rate and rhythm no rubs murmurs or extra sounds Lungs: Clear to auscultation, breathing unlabored, no rales or wheezes Abdomen: Positive bowel sounds, soft nontender to palpation, nondistended Extremities: No clubbing, cyanosis, or edema Skin: No evidence of breakdown, no evidence of rash    Musculoskeletal:     Cervical back: Normal range of motion. No rigidity.     Comments: 5/5 in RUE/RLE/LUE/LLE  Skin:    Comments: R hand IV- look OK No skin breakdown seen  Neurological:     Mental Status: He is alert and oriented to person, place, and time.     Comments: Right facial weakness with flat,dysarthric speech. Unable to close right eye.  Ataxia with finger to nose Left>Right as well as decrease in LLE motor control. Sensory deficits left face, LUE and LLE.  Dysarthric. Fair processing.   Assessment/Plan: 1. Functional deficits which require 3+ hours per day of interdisciplinary therapy in a comprehensive inpatient rehab setting. Physiatrist is providing close team supervision and 24 hour management of active medical problems listed  below. Physiatrist and rehab team continue to assess barriers to discharge/monitor patient progress toward functional and medical goals  Care Tool:  Bathing    Body parts bathed by patient: Right arm, Left arm, Chest, Abdomen, Right upper leg, Left upper leg, Right lower leg, Left lower leg, Face, Front perineal area, Buttocks   Body parts bathed by helper: Front perineal area, Buttocks     Bathing assist Assist Level: Minimal Assistance - Patient > 75%     Upper Body Dressing/Undressing Upper body dressing   What is the patient wearing?: Pull over shirt    Upper body assist Assist Level: Supervision/Verbal cueing    Lower Body Dressing/Undressing Lower body dressing      What is the patient wearing?: Pants, Incontinence brief     Lower body assist Assist for lower body dressing: Minimal Assistance - Patient > 75%     Toileting Toileting Toileting Activity did not occur (Clothing management and hygiene only): N/A (no void or bm) (foley in place)  Toileting assist Assist for toileting: Maximal Assistance - Patient 25 - 49%     Transfers Chair/bed transfer  Transfers assist     Chair/bed  transfer assist level: Minimal Assistance - Patient > 75% (squat pivot)     Locomotion Ambulation   Ambulation assist   Ambulation activity did not occur: Safety/medical concerns (requires +2 and skilled assist with use of hallway rail)  Assist level: Total Assistance - Patient < 25% Assistive device: Maxi Sky Max distance: 75ft   Walk 10 feet activity   Assist  Walk 10 feet activity did not occur: Safety/medical concerns        Walk 50 feet activity   Assist Walk 50 feet with 2 turns activity did not occur: Safety/medical concerns         Walk 150 feet activity   Assist Walk 150 feet activity did not occur: Safety/medical concerns         Walk 10 feet on uneven surface  activity   Assist Walk 10 feet on uneven surfaces activity did not occur:  Safety/medical concerns         Wheelchair     Assist Is the patient using a wheelchair?: Yes   Wheelchair activity did not occur: Safety/medical concerns         Wheelchair 50 feet with 2 turns activity    Assist            Wheelchair 150 feet activity     Assist          Blood pressure (!) 144/99, pulse 90, temperature 98.3 F (36.8 C), temperature source Oral, resp. rate 16, height 6' (1.829 m), SpO2 96 %.    Medical Problem List and Plan: 1.  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  Antithrombotics: -DVT/anticoagulation:  Pharmaceutical: Lovenox added.              -antiplatelet therapy: ASA/Brilinta. 3. Pain Management: Tylenol prn for HA.  4. Mood: LCSW to follow for evaluation and support.              -antipsychotic agents: N/A 5. Neuropsych: This patient is capable of making decisions on his own behalf. 6. Skin/Wound Care: Routine pressure-relief measures. 7. Fluids/Electrolytes/Nutrition: Monitor I's/O.  Check CMet in AM 8.  HTN with new hypotension/orthostaitc: Monitor BP TID.  Orthostatic symptoms reported today. --On hydralazine 25 mg 3 times daily, Cozaar 50 mg twice daily and amlodipine 10 mg daily.  11/9- orthostatic today- decreased losartan to 25 mg BID and decreased Norvasc to 5 mg daily- will add TEDs and ACE wraps and see how pt does.  --We will check orthostatic vital signs.  11/11- BP drops to 70s/50s per pt/wife and dizzy- will decrease hydralazine to 10 mg TID (BP is running in bed 150s/160s/systolic) will try Midodrine 2.5 mg with breakfast/lunch- advised to sit up after midodrine for 4 hours.   -continue to hold  Losartan for now Vitals:   09/12/21 2004 09/13/21 0527  BP: (!) 149/108 (!) 144/99  Pulse: 93 90  Resp: 16 16  Temp: 98.2 F (36.8 C) 98.3 F (36.8 C)  SpO2: 100% 96%   BPs running a bit high no orthostatic changes  with PT session yesterday after d/c proamatine , will increase flomax dose  9.  Urinary retention: Likely exacerbated by constipation. Augment bowel program and remove foley in am.  --Continue Flomax--changed to bedtime to avoid orthostatic symptoms. 11/9- increased Flomax to 0.8 mg nightly, however will check U/A and Cx due to elevated WBC and retention. 11/16 Flomax at 0.4mg  due to orthostasis, increase flomax as BP is up add urecholine  Cont voiding trial tolerating caths  10. Constipation: increase Senna S 2 po BID .      11/15--repeat sorbitol no results, abd soft , + BS, give SMOG enema today after therapy  11.  Leukocytosis: Resolved 12.  Intermittent hypokalemia: Recheck CMET/Mg level in am.    --May need standing dose supplement is on hydralazine. 11/11- K+ 4.8- could be  hemolysis vs supplement-  13.  Hyperglycemia: Likely stress related as Hemoglobin A1c 5.5. 14.  Dyslipidemia: Continue Lipitor. 15. 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 16. Unable to close right eye lid. Peripheral 7 Right - c/o "film over R eye" which is normal with lacrilube  -ordered eye patch right eye  -eye drops. May keep at bedside     LOS: 9 days A FACE TO FACE EVALUATION WAS PERFORMED  Erick Colace 09/13/2021, 7:28 AM

## 2021-09-13 NOTE — Evaluation (Signed)
Recreational Therapy Assessment and Plan  Patient Details  Name: Maurice Little MRN: [id number] Date of Birth: 03/11/1956 Today's Date: 09/13/2021  Rehab Potential:  Good ELOS:   d/c 12/1  Assessment   Hospital Problem: Principal Problem:   Cerebellar stroke [facility])     Past Medical History:      Past Medical History:  Diagnosis Date   Hypertension     PAD (peripheral artery disease) ([place])      Past Surgical History:       Past Surgical History:  Procedure Laterality Date   BUBBLE STUDY   08/31/2021    Procedure: BUBBLE STUDY;  Surgeon: Skeet Latch, MD;  Location: [place];  Service: Cardiovascular;;   IR ANGIO VERTEBRAL SEL SUBCLAVIAN INNOMINATE UNI L MOD SED   08/30/2021   IR ANGIO VERTEBRAL SEL SUBCLAVIAN INNOMINATE UNI R MOD SED   08/30/2021   IR CT HEAD LTD   08/30/2021   IR INTRA CRAN STENT   08/30/2021   IR US GUIDE VASC ACCESS RIGHT   08/30/2021   RADIOLOGY WITH ANESTHESIA N/A 08/30/2021    Procedure: IR WITH ANESTHESIA;  Surgeon: Luanne Bras, MD;  Location: [place];  Service: Radiology;  Laterality: N/A;   TEE WITHOUT CARDIOVERSION N/A 08/31/2021    Procedure: TRANSESOPHAGEAL ECHOCARDIOGRAM (TEE);  Surgeon: Skeet Latch, MD;  Location: [facility] ENDOSCOPY;  Service: Cardiovascular;  Laterality: N/A;      Assessment & Plan Clinical Impression: Patient is a 65 y.o. year old male with recent admission to the hospital on 08/29/21 with onset of dizziness and headaches approximately day prior to admission followed by left-sided tingling for 2 hours the next day.  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  CTA head/neck showed atherosclerosis with question of high-grade stenosis or nonocclusive clot in basilar artery.  He was loaded with DAPT and started on IV heparin.  He developed right facial droop with right hearing loss, diplopia with nystagmus as well as slurred speech with nausea evening past admission.  He underwent cerebral angiogram with stent assisted  angioplasty of occluded dominant right-VA origin and mid basilar artery by Dr. , .  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  Marland Kitchen  Patient transferred to CIR on 09/04/2021.     Met with pt and wife today to discuss TR services, leisure interests, activity analysis/modifications and stress management.  Pt presents with decreased activity tolerance, decreased functional mobility, decreased balance, ataxia, decreased coordination, decreased vision, dysarthria, feelings of stress/anxiety Limiting pt's independence with leisure/community pursuits. Plan  Min 1 TR session >20 minutes per week during LOS  Recommendations for other services: None   Discharge Criteria: Patient will be discharged from TR if patient refuses treatment 3 consecutive times without medical reason.  If treatment goals not met, if there is a change in medical status, if patient makes no progress towards goals or if patient is discharged from hospital.  The above assessment, treatment plan, treatment alternatives and goals were discussed and mutually agreed upon: by patient  [place] 09/13/2021, 3:37 PM

## 2021-09-13 NOTE — Progress Notes (Signed)
Occupational Therapy Session Note  Patient Details  Name: Maurice Little MRN: [id number] Date of Birth: 1956/10/20  Today's Date: 09/13/2021 OT Individual Time: 7253-6644 OT Individual Time Calculation (min): 73 min    Short Term Goals: Week 1:  OT Short Term Goal 1 (Week 1): Pt will maintain dynamic sitting balance at min guard assist during selfcare tasks sitting unsupported. OT Short Term Goal 1 - Progress (Week 1): Not met OT Short Term Goal 2 (Week 1): Pt will complete LB bathing at min assist level sit to stand for two consecutive sessions,. OT Short Term Goal 2 - Progress (Week 1): Met OT Short Term Goal 3 (Week 1): Pt will complete LB dressing sit to stand with mod assist for two consecutive sessions. OT Short Term Goal 3 - Progress (Week 1): Met OT Short Term Goal 4 (Week 1): Pt will complete toilet transfers with min assist sit to stand. OT Short Term Goal 4 - Progress (Week 1): Not met Week 2:  OT Short Term Goal 1 (Week 2): Pt will complete toilet transfers with min assist sit to stand. OT Short Term Goal 2 (Week 2): Pt will maintain dynamic sitting balance at min guard assist during selfcare tasks sitting unsupported. OT Short Term Goal 3 (Week 2): Pt will complete LB dressing with min assist for two consecutive sessions.  Skilled Therapeutic Interventions/Progress Updates:  Patient met lying supine in bed in agreement with OT treatment session. 0/10 pain reported at rest and with activity but patient reports dizziness 3/10. Supine to EOB with supervision A and cues for technique and pacing. Sit to stand in walk-in shower transfer with Min A via stand-pivot (face to face transfer). Large part of session with focus on sitting balance (supported vs unsupported) during ADLs with intermittent Min guard for dynamic sitting while reaching outside of BOS). UB bathing with supervision A and LB bathing with Min A and lateral leans. Patient then able to dress UB seated EOB with supervision A  and LB in sitting/standing with Min A for steadying. Mirror for visual feedback and cueing for orientation to midline 2/2 R bias and truncal ataxia. Functional mobility to sink level with +2 assist for safety. Able to comb hair seated at sink level with set-up assist. In hospital hallway, patient completed sit to stand x4 with Min A from TIS wc and 3 bouts of mobility with max multimodal cues and +2 assist secondary to deficits listed above. Stand-pivot to EOB with Min A via face to face transfer and return to supine with supervision A. Session concluded with patient lying supine in bed with call bell within reach, bed alarm activated and all needs met. Wife present at bedside.   Therapy Documentation Precautions:  Precautions Precautions: Fall Precaution Comments: diplopia, Ataxic R; L side numbness Restrictions Weight Bearing Restrictions: No General:    Therapy/Group: Individual Therapy  Marjorie Lussier R [place] 09/13/2021, 6:56 AM

## 2021-09-14 DIAGNOSIS — I639 Cerebral infarction, unspecified: Secondary | ICD-10-CM | POA: Diagnosis not present

## 2021-09-14 MED ORDER — FLEET ENEMA 7-19 GM/118ML RE ENEM
1.0000 | ENEMA | Freq: Once | RECTAL | Status: AC
  Administered 2021-09-14: 1 via RECTAL
  Filled 2021-09-14: qty 1

## 2021-09-14 NOTE — Progress Notes (Signed)
Physical Therapy Session Note  Patient Details  Name: Maurice Little MRN: [id number] Date of Birth: 05/05/1956  Today's Date: 09/14/2021 PT Individual Time: 5009-3818 PT Individual Time Calculation (min): 72 min   Short Term Goals: Week 2:  PT Short Term Goal 1 (Week 2): Pt will perform sit<>stands with min assist using LRAD consistently PT Short Term Goal 2 (Week 2): Pt will perform stand pivot transfers using LRAD with mod assist consistently PT Short Term Goal 3 (Week 2): Pt will ambulate at least 76ft using LRAD with mod assist of 1 PT Short Term Goal 4 (Week 2): Pt will initiate stair training Week 3:     Skilled Therapeutic Interventions/Progress Updates:  Patient supine in bed on entrance to room. Patient alert and agreeable to PT session. Shoes donned while seated EOB with MaxA for time. Pt also relates improved diplopia following session with OT this day.   Patient with no pain complaint throughout session.  Therapeutic Activity: Bed Mobility: Patient performed supine <> sit with supervision/ CGA. No vc required for technique. Transfers: Squat pivot bed--> w/c performed with CGA/ MinA for positioning/ safety. Patient performed sit<>stand and stand pivot transfers throughout session with CGA/ MinA. Performance improves with NMR.  Neuromuscular Re-ed: NMR facilitated during session with focus on standing balance, coordination, proprioception, pre-gait training. Pt guided in sit<>stand training with focus on pre-performance visualization, and motor planning, undershooting effort with RLE. With NDT training for pelvic rotation, forward lean and NDT facilitation at ribcage, pt improves sit<>stand performance to CGA and maintains stance with BUE and CGA.   Progressed pt to blocked practice of forward/ backward stepping using slide board between feet as visual aid to prevent crossover stepping/ adduction during step advance. HHA provided by therapist and +2. Pt provided with CGA and  intermittent light MinA to maintain balance. VC provided for small steps with RLE and focus on step placement. Wife brought in to session for update on progress.  NMR performed for improvements in motor control and coordination, balance, sequencing, judgement, and self confidence/ efficacy in performing all aspects of mobility at highest level of independence.   Very pleased with pt's progress this session.   Patient supine  in bed at end of session with brakes locked, bed alarm set, and all needs within reach. Pt ready for enema to be provided by RN.      Therapy Documentation Precautions:  Precautions Precautions: Fall Precaution Comments: diplopia, Ataxic R; L side numbness Restrictions Weight Bearing Restrictions: No General:   Pain:  Pt with no pain complaint this session.   Therapy/Group: Individual Therapy  Loel Dubonnet PT, DPT 09/14/2021, 6:45 PM

## 2021-09-14 NOTE — Progress Notes (Signed)
Physical Therapy Session Note  Patient Details  Name: Maurice Little MRN: [id number] Date of Birth: 04/17/1956  Today's Date: 09/14/2021 PT Individual Time: 0800-0855 PT Individual Time Calculation (min): 55 min   Short Term Goals: Week 2:  PT Short Term Goal 1 (Week 2): Pt will perform sit<>stands with min assist using LRAD consistently PT Short Term Goal 2 (Week 2): Pt will perform stand pivot transfers using LRAD with mod assist consistently PT Short Term Goal 3 (Week 2): Pt will ambulate at least 6ft using LRAD with mod assist of 1 PT Short Term Goal 4 (Week 2): Pt will initiate stair training  Skilled Therapeutic Interventions/Progress Updates:    Patient received reclined in bed, agreeable to PT. He denies pain. Patient able to come sit edge of bed with supervision and HOB elevated. ModA stand pivot to wc with noted R lateral lean. PT transporting patient in wc to therapy gym for time management and energy conservation. He ambulated the following bouts on the treadmill with LiteGait:  2'30" for 141ft at 0.4mph (125BPM, 100% O2) 2'30" 154ft at 0.21mph (136Bpm, 98% o2) 2'30" for 152ft at 0.38mph (137BPM (99% O2) He required extended seated rest breaks between bouts to allow for HR to recover to 100-teens. Patient with improved coordination of gait, but continued to display excessive postural sway. Verbal cues needed to engage L hip stabilizers to aid in minimizing lateral sway with good results. PT allowing increased degrees of freedom by allowing LiteGait to rotate with patient. Treadmill track with white line continues to assist patient in modulating step width. He will continue to benefit from high intensity gait training for improved neuro return. Patient returning to room in wc, seatbelt alarm on, call light within reach, wife at bedside.   Therapy Documentation Precautions:  Precautions Precautions: Fall Precaution Comments: diplopia, Ataxic R; L side numbness Restrictions Weight  Bearing Restrictions: No    Therapy/Group: Individual Therapy  Elizebeth Koller, PT, DPT, CBIS  09/14/2021, 7:35 AM

## 2021-09-14 NOTE — Progress Notes (Signed)
PROGRESS NOTE   Subjective/Complaints:  Pt reports BP maintaining- no dropping in therapy anymore- wearing knee high TEDs still.  Was disimpacted yesterday but feels like needs Fleets enema today- will schedule after therapy.  Got foley out- still not peeing .   ROS:  Pt denies SOB, abd pain, CP, N/V/(+) C/D, and vision changes   Objective:   No results found. No results for input(s): WBC, HGB, HCT, PLT in the last 72 hours. No results for input(s): NA, K, CL, CO2, GLUCOSE, BUN, CREATININE, CALCIUM in the last 72 hours.  Intake/Output Summary (Last 24 hours) at 09/14/2021 0804 Last data filed at 09/14/2021 0400 Gross per 24 hour  Intake 420 ml  Output 2700 ml  Net -2280 ml        Physical Exam: Vital Signs Blood pressure 139/78, pulse 78, temperature 98.5 F (36.9 C), temperature source Oral, resp. rate 14, height 6' (1.829 m), SpO2 99 %.   General: awake, alert, appropriate, NAD HENT: dysconjugate gaze; oropharynx moist- L lens of eyeglasses covered CV: regular rate; no JVD Pulmonary: CTA B/L; no W/R/R- good air movement GI: soft, slightly TTP no rebound; ND; hypoactive BS Psychiatric: appropriate Neurological: Ox3; dysarthric- sing song voice;  Musculoskeletal:     Cervical back: Normal range of motion. No rigidity.     Comments: 5/5 in RUE/RLE/LUE/LLE  Skin:    Comments: R hand IV- look OK No skin breakdown seen  Neurological:     Mental Status: He is alert and oriented to person, place, and time.     Comments: Right facial weakness with flat,dysarthric speech. Unable to close right eye.  Ataxia with finger to nose Left>Right as well as decrease in LLE motor control. Sensory deficits left face, LUE and LLE.  Dysarthric. Fair processing.   Assessment/Plan: 1. Functional deficits which require 3+ hours per day of interdisciplinary therapy in a comprehensive inpatient rehab setting. Physiatrist is  providing close team supervision and 24 hour management of active medical problems listed below. Physiatrist and rehab team continue to assess barriers to discharge/monitor patient progress toward functional and medical goals  Care Tool:  Bathing    Body parts bathed by patient: Right arm, Left arm, Chest, Abdomen, Right upper leg, Left upper leg, Right lower leg, Left lower leg, Face, Front perineal area, Buttocks   Body parts bathed by helper: Front perineal area, Buttocks     Bathing assist Assist Level: Minimal Assistance - Patient > 75%     Upper Body Dressing/Undressing Upper body dressing   What is the patient wearing?: Pull over shirt    Upper body assist Assist Level: Supervision/Verbal cueing    Lower Body Dressing/Undressing Lower body dressing      What is the patient wearing?: Pants, Incontinence brief     Lower body assist Assist for lower body dressing: Minimal Assistance - Patient > 75%     Toileting Toileting Toileting Activity did not occur (Clothing management and hygiene only): N/A (no void or bm) (foley in place)  Toileting assist Assist for toileting: Maximal Assistance - Patient 25 - 49%     Transfers Chair/bed transfer  Transfers assist     Chair/bed transfer assist  level: Moderate Assistance - Patient 50 - 74% (stand pivot)     Locomotion Ambulation   Ambulation assist   Ambulation activity did not occur: Safety/medical concerns (requires +2 and skilled assist with use of hallway rail)  Assist level: 2 helpers Assistive device: Lite Gait Max distance: 185ft   Walk 10 feet activity   Assist  Walk 10 feet activity did not occur: Safety/medical concerns        Walk 50 feet activity   Assist Walk 50 feet with 2 turns activity did not occur: Safety/medical concerns         Walk 150 feet activity   Assist Walk 150 feet activity did not occur: Safety/medical concerns         Walk 10 feet on uneven surface   activity   Assist Walk 10 feet on uneven surfaces activity did not occur: Safety/medical concerns         Wheelchair     Assist Is the patient using a wheelchair?: Yes   Wheelchair activity did not occur: Safety/medical concerns         Wheelchair 50 feet with 2 turns activity    Assist            Wheelchair 150 feet activity     Assist          Blood pressure 139/78, pulse 78, temperature 98.5 F (36.9 C), temperature source Oral, resp. rate 14, height 6' (1.829 m), SpO2 99 %.    Medical Problem List and Plan: 1.  R cerebellar/pontine stroke and L parietal and occipital stroke with Sensory issues and balance impairment- due to stroke, RIght CN 6, 7  palsy , hearing impairment may be from CN 7 +/- 8, decreased pain and temp on Left side              -patient may  shower             -ELOS/Goals: 16-18 days- min A  -no new CVA per neuro/CT head , stents are clear  Con't PT, OT and SLP- CIR- using knee high TEDs 2.  Antithrombotics: -DVT/anticoagulation:  Pharmaceutical: Lovenox added.              -antiplatelet therapy: ASA/Brilinta. 3. Pain Management: Tylenol prn for HA.  4. Mood: LCSW to follow for evaluation and support.              -antipsychotic agents: N/A 5. Neuropsych: This patient is capable of making decisions on his own behalf. 6. Skin/Wound Care: Routine pressure-relief measures. 7. Fluids/Electrolytes/Nutrition: Monitor I's/O.  Check CMet in AM 8.  HTN with new hypotension/orthostaitc: Monitor BP TID.  Orthostatic symptoms reported today. --On hydralazine 25 mg 3 times daily, Cozaar 50 mg twice daily and amlodipine 10 mg daily.  11/9- orthostatic today- decreased losartan to 25 mg BID and decreased Norvasc to 5 mg daily- will add TEDs and ACE wraps and see how pt does.  --We will check orthostatic vital signs.  11/11- BP drops to 70s/50s per pt/wife and dizzy- will decrease hydralazine to 10 mg TID (BP is running in bed  150s/160s/systolic) will try Midodrine 2.5 mg with breakfast/lunch- advised to sit up after midodrine for 4 hours.   -continue to hold Losartan for now Vitals:   09/14/21 0435 09/14/21 0500  BP: (!) 137/97 139/78  Pulse:  78  Resp:  14  Temp:  98.5 F (36.9 C)  SpO2:     BPs running a bit high no orthostatic changes  with PT session yesterday after d/c proamatine , will increase flomax dose  11/18- BP doing well- con't regimen and knee high TEDs 9.  Urinary retention: Likely exacerbated by constipation. Augment bowel program and remove foley in am.  --Continue Flomax--changed to bedtime to avoid orthostatic symptoms. 11/9- increased Flomax to 0.8 mg nightly, however will check U/A and Cx due to elevated WBC and retention. 11/16 Flomax at 0.4mg  due to orthostasis, increase flomax as BP is up add urecholine  11.18- tolerating BP; con't regimen Cont voiding trial tolerating caths  10. Constipation: increase Senna S 2 po BID .  11/18- wil give enema today after therapy- was disimpacted last night.   11.  Leukocytosis: Resolved 12.  Intermittent hypokalemia: Recheck CMET/Mg level in am.    --May need standing dose supplement is on hydralazine. 11/11- K+ 4.8- could be  hemolysis vs supplement-  13.  Hyperglycemia: Likely stress related as Hemoglobin A1c 5.5. 14.  Dyslipidemia: Continue Lipitor. 15. 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 16. Unable to close right eye lid. Peripheral 7 Right - c/o "film over R eye" which is normal with lacrilube  -ordered eye patch right eye  -eye drops. May keep at bedside     LOS: 10 days A FACE TO FACE EVALUATION WAS PERFORMED  Arlenne Kimbley 09/14/2021, 8:04 AM

## 2021-09-14 NOTE — Progress Notes (Signed)
Occupational Therapy Session Note  Patient Details  Name: Maurice Little MRN: [id number] Date of Birth: 1956/06/29  Today's Date: 09/14/2021 OT Individual Time: 8338-2505 OT Individual Time Calculation (min): 61 min    Short Term Goals: Week 2:  OT Short Term Goal 1 (Week 2): Pt will complete toilet transfers with min assist sit to stand. OT Short Term Goal 2 (Week 2): Pt will maintain dynamic sitting balance at min guard assist during selfcare tasks sitting unsupported. OT Short Term Goal 3 (Week 2): Pt will complete LB dressing with min assist for two consecutive sessions.  Skilled Therapeutic Interventions/Progress Updates:    Pt sitting up in the tilt in space wheelchair to start with spouse present.  Took him down to the therapy gym with mod assist stand pivot transfer to the therapy mat  stand pivot.  Increased ataxia with too far step length in the RLE during transfer, resulting in LOB.  Pt reports dizziness at 3/10 in sitting.  With transition to supine at supervision level, he reports dizziness decreasing to a 2/10.  Rolling to either side did not increase his dizziness.  When he transitioned back to sitting, the dizziness increased back to a 3/10.  Fixed gaze compensation did not improve his dizziness.  Had him complete VOR testing in sitting.  He was unable to rotate his head side to side of up and down and maintain visual fixation on target at midline.  He would lose fixation to both the right and left sides as well as when tilting his head up, most of the time.  Issued handout with gaze stabilization exercises to be completed daily along with oculomotor exercises.  He was able to visually scan in all directions both near and far with no report of diplopia this session.  VOR cancellation test was negative as he was able to maintain visual fixation on moving target when moving his eyes and head together.  He continues to exhibit some resting horizontal nystagmus as well with increased dizziness  noted with head shaking test, but nystagmus did not increase in intensity.    Removed nasal occlusion tape from the left lens at this time and will continue to monitor.  Completed transfer back to the wheelchair with mod assist for short distance mobility with return to the room.  He transferred stand pivot back to the bed at min assist level.  Reviewed gaze stabilization exercises with his spouse.  He transferred to supine at supervision level and was left with call button and phone in reach.    Therapy Documentation Precautions:  Precautions Precautions: Fall Precaution Comments: diplopia, Ataxic R; L side numbness Restrictions Weight Bearing Restrictions: No   Pain: Pain Assessment Pain Scale: 0-10 Pain Score: 0-No pain    Therapy/Group: Individual Therapy  Josten Warmuth OTR/L 09/14/2021, 12:45 PM

## 2021-09-16 DIAGNOSIS — I10 Essential (primary) hypertension: Secondary | ICD-10-CM | POA: Diagnosis not present

## 2021-09-16 DIAGNOSIS — K5901 Slow transit constipation: Secondary | ICD-10-CM | POA: Diagnosis not present

## 2021-09-16 DIAGNOSIS — E785 Hyperlipidemia, unspecified: Secondary | ICD-10-CM | POA: Diagnosis not present

## 2021-09-16 DIAGNOSIS — I639 Cerebral infarction, unspecified: Secondary | ICD-10-CM | POA: Diagnosis not present

## 2021-09-16 MED ORDER — LOSARTAN POTASSIUM 25 MG PO TABS
25.0000 mg | ORAL_TABLET | Freq: Every day | ORAL | Status: DC
  Administered 2021-09-16 – 2021-09-27 (×12): 25 mg via ORAL
  Filled 2021-09-16 (×12): qty 1

## 2021-09-16 MED ORDER — POLYETHYLENE GLYCOL 3350 17 G PO PACK
17.0000 g | PACK | Freq: Two times a day (BID) | ORAL | Status: DC
  Administered 2021-09-16 – 2021-09-23 (×10): 17 g via ORAL
  Filled 2021-09-16 (×20): qty 1

## 2021-09-16 NOTE — Progress Notes (Signed)
PROGRESS NOTE   Subjective/Complaints: Patient seen sitting up in bed this AM, being fed breakfast by family.  He states hs slept fairly well overnight.  He complains of double vision, dizziness, fatigue, constipation.  Wife with questions regarding insurance paperwork.    ROS:  +Left sided numbness. Denies CP, SOB, N/V/D   Objective:   No results found. No results for input(s): WBC, HGB, HCT, PLT in the last 72 hours. No results for input(s): NA, K, CL, CO2, GLUCOSE, BUN, CREATININE, CALCIUM in the last 72 hours.  Intake/Output Summary (Last 24 hours) at 09/16/2021 0845 Last data filed at 09/16/2021 0735 Gross per 24 hour  Intake 600 ml  Output 2465 ml  Net -1865 ml         Physical Exam: Vital Signs Blood pressure (!) 144/98, pulse 92, temperature 98.2 F (36.8 C), temperature source Oral, resp. rate 18, height 6' (1.829 m), SpO2 96 %. Constitutional: No distress . Vital signs reviewed. HENT: Normocephalic.  Atraumatic. Eyes: Some dysconjugate gaze. No discharge. Cardiovascular: No JVD.  RRR. Respiratory: Normal effort.  No stridor.  Bilateral clear to auscultation. GI: Non-distended.  BS +. Skin: Warm and dry.  Intact. Psych: Normal mood.  Normal behavior. Musc: No edema in extremities.  No tenderness in extremities. Neuro: Alert Motor: Grossly intact Right facial weakness  Dysarthria  Mild LUE ataxia Mild RUE dysmetria  Assessment/Plan: 1. Functional deficits which require 3+ hours per day of interdisciplinary therapy in a comprehensive inpatient rehab setting. Physiatrist is providing close team supervision and 24 hour management of active medical problems listed below. Physiatrist and rehab team continue to assess barriers to discharge/monitor patient progress toward functional and medical goals  Care Tool:  Bathing    Body parts bathed by patient: Right arm, Left arm, Chest, Abdomen, Right upper  leg, Left upper leg, Right lower leg, Left lower leg, Face, Front perineal area, Buttocks   Body parts bathed by helper: Front perineal area, Buttocks     Bathing assist Assist Level: Minimal Assistance - Patient > 75%     Upper Body Dressing/Undressing Upper body dressing   What is the patient wearing?: Pull over shirt    Upper body assist Assist Level: Supervision/Verbal cueing    Lower Body Dressing/Undressing Lower body dressing      What is the patient wearing?: Pants, Incontinence brief     Lower body assist Assist for lower body dressing: Minimal Assistance - Patient > 75%     Toileting Toileting Toileting Activity did not occur (Clothing management and hygiene only): N/A (no void or bm) (foley in place)  Toileting assist Assist for toileting: Maximal Assistance - Patient 25 - 49%     Transfers Chair/bed transfer  Transfers assist     Chair/bed transfer assist level: Moderate Assistance - Patient 50 - 74% (stand pivot)     Locomotion Ambulation   Ambulation assist   Ambulation activity did not occur: Safety/medical concerns (requires +2 and skilled assist with use of hallway rail)  Assist level: 2 helpers Assistive device: Lite Gait Max distance: 133ft   Walk 10 feet activity   Assist  Walk 10 feet activity did not occur: Safety/medical concerns  Walk 50 feet activity   Assist Walk 50 feet with 2 turns activity did not occur: Safety/medical concerns         Walk 150 feet activity   Assist Walk 150 feet activity did not occur: Safety/medical concerns         Walk 10 feet on uneven surface  activity   Assist Walk 10 feet on uneven surfaces activity did not occur: Safety/medical concerns         Wheelchair     Assist Is the patient using a wheelchair?: Yes   Wheelchair activity did not occur: Safety/medical concerns         Wheelchair 50 feet with 2 turns activity    Assist            Wheelchair  150 feet activity     Assist          Blood pressure (!) 144/98, pulse 92, temperature 98.2 F (36.8 C), temperature source Oral, resp. rate 18, height 6' (1.829 m), SpO2 96 %.    Medical Problem List and Plan: 1.  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  Antithrombotics: -DVT/anticoagulation:  Pharmaceutical: Lovenox added.              -antiplatelet therapy: ASA/Brilinta. 3. Pain Management: Tylenol prn for HA.  4. Mood: LCSW to follow for evaluation and support.              -antipsychotic agents: N/A 5. Neuropsych: This patient is capable of making decisions on his own behalf. 6. Skin/Wound Care: Routine pressure-relief measures. 7. Fluids/Electrolytes/Nutrition: Monitor I's/Os 8.  HTN with new hypotension/orthostaitc: Monitor BP TID.   Decreased Norvasc to 5 mg daily TEDs and ACE wraps and see how pt does.  Decreased hydralazine to 10 mg TID   Vitals:   09/15/21 1930 09/16/21 0529  BP: (!) 165/97 (!) 144/98  Pulse: 90 92  Resp: 20 18  Temp: 98.4 F (36.9 C) 98.2 F (36.8 C)  SpO2: 97% 96%   Flomax increased Losartan 25 daily started on 11/20 9.  Urinary retention: Likely exacerbated by constipation. Augment bowel program and remove foley in am.  Continue Flomax Cont voiding trial tolerating caths  10. Constipation: increased Senna S 2 po BID .   Miralax scheduled  Enema ordered per family request 11.  Leukocytosis: Resolved 12.  Intermittent hypokalemia: Resolved 13.  Hyperglycemia: Likely stress related as Hemoglobin A1c 5.5. 14.  Dyslipidemia: Continue Lipitor 15. 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 16. Unable to close  right eye lid. Peripheral 7 Right - c/o "film over R eye" which is normal with lacrilube  -ordered eye patch right eye  -eye drops. May keep at bedside  LOS: 12 days A FACE TO FACE EVALUATION WAS PERFORMED  Colleene Swarthout Karis Juba 09/16/2021, 8:45 AM

## 2021-09-16 NOTE — Progress Notes (Signed)
Physical Therapy Session Note  Patient Details  Name: Maurice Little MRN: [id number] Date of Birth: 01/28/1956  Today's Date: 09/16/2021 PT Individual Time: 0800-0900 PT Individual Time Calculation (min): 60 min   Short Term Goals: Week 2:  PT Short Term Goal 1 (Week 2): Pt will perform sit<>stands with min assist using LRAD consistently PT Short Term Goal 2 (Week 2): Pt will perform stand pivot transfers using LRAD with mod assist consistently PT Short Term Goal 3 (Week 2): Pt will ambulate at least 67f using LRAD with mod assist of 1 PT Short Term Goal 4 (Week 2): Pt will initiate stair training  Skilled Therapeutic Interventions/Progress Updates: Pt presented in bed with wife present agreeable to therapy. Pt denies pain during session. Pt dressed and agreeable to participate in high intensity gait training via treadmill. Pt performed supine to sit with CGA and use of bed features. Performed squat pivot transfer to TIS with modA. Pt transported to day room and set up with Lite Gait. Pt able to perform Sit to stand with CGA and required modA to step up to treadmill. Pt able to perform 3 bouts of ambulation on treadmill with extended time for recovery between bouts. First bout pt ambulated at 0.824m for 2:25 and 14616fHR 136 and SpO2 100% after ambulation. Second bout at 0.8mp52mor 175ft23fh HR 146 and SpO2 100%. Final bout at 0.7mph 87m 2:31 and 154ft, 49f HR 141 and SpO2 100% after ambulation. PTA noted that HR dropped into 130s also immediately and that on third bout pt with decreased R lateral lean vs first bout. Pt able to step off treadmill via Lite gait with modA and was CGA to minA for Sit to stand when on treadmill. Pt transported back to room at end of session and performed stand pivot transfer to bed with PTA providing facilitation with weight shifting and requiring overall modA. Pt was able to return to supine with CGA and use of bed features. Pt left in bed at end of session with bed alarm  on, call bell within reach and needs met.      Therapy Documentation Precautions:  Precautions Precautions: Fall Precaution Comments: diplopia, Ataxic R; L side numbness Restrictions Weight Bearing Restrictions: No    Therapy/Group: Individual Therapy  Kylei Purington 09/16/2021, 3:53 PM

## 2021-09-16 NOTE — Progress Notes (Signed)
Occupational Therapy Session Note  Patient Details  Name: Maurice Little MRN: [id number] Date of Birth: [birth year]  Today's Date: 09/16/2021 OT Individual Time: 1300-1355 OT Individual Time Calculation (min): 55 min    Short Term Goals: Week 2:  OT Short Term Goal 1 (Week 2): Pt will complete toilet transfers with min assist sit to stand. OT Short Term Goal 2 (Week 2): Pt will maintain dynamic sitting balance at min guard assist during selfcare tasks sitting unsupported. OT Short Term Goal 3 (Week 2): Pt will complete LB dressing with min assist for two consecutive sessions.   Skilled Therapeutic Interventions/Progress Updates:    Pt semi reclined in bed, no c/o pain, agreeable to taking shower. Doffed TEDs with total assist at bed level. Pt completed supine to sit with supervision.  Sit to stand at stedy with supervision.  Transported to bathroom and completed stand to sit at shower bench with CGA using grab bar.  Pt doffed shirt with mod I and brief/pants with CGA.  Pt bathed UB with min assist for back and CGA for lateral and forward leans when bathing buttocks and feet.  Encouraged pt in RUE use to wash as much of body as possible for neuro re-ed and improved functional use of dominant arm.RUE movements slightly ataxic in nature, but overall he was able to use functionally with increased time and without dropping wash cloth.  Pt dried off then donned shirt with setup.  Educated pt on technique for donning brief and pt completed tab management with supervision.  Donned pants and CGA during sit<>stand.  Pt completed stedy sit<>stand transfer with supervision and transported to EOB.  BP taken:126/95 without TEDs donned.  Pt completed sit to supine with supervision and donned socks in modified figure 4 position with setup.  Call bell in reach, bed alarm on at end of session.  Pt exhibited improved independence during LB bathing today requiring only CGA instead of min assist, with good carryover of  lateral leaning technique which he was trained on during last ADL session.  Therapy Documentation Precautions:  Precautions Precautions: Fall Precaution Comments: diplopia, Ataxic R; L side numbness Restrictions Weight Bearing Restrictions: No    Therapy/Group: Individual Therapy  Amie Critchley 09/16/2021, 3:48 PM

## 2021-09-16 NOTE — Progress Notes (Signed)
Physical Therapy Session Note  Patient Details  Name: Maurice Little MRN: [id number] Date of Birth: 1955-12-27  Today's Date: 09/16/2021 PT Individual Time: 3299-2426 PT Individual Time Calculation (min): 70 min   Short Term Goals: Week 2:  PT Short Term Goal 1 (Week 2): Pt will perform sit<>stands with min assist using LRAD consistently PT Short Term Goal 2 (Week 2): Pt will perform stand pivot transfers using LRAD with mod assist consistently PT Short Term Goal 3 (Week 2): Pt will ambulate at least 89ft using LRAD with mod assist of 1 PT Short Term Goal 4 (Week 2): Pt will initiate stair training  Skilled Therapeutic Interventions/Progress Updates:    Pt received seated in bed, agreeable to PT session. No complaints of pain. Semi-reclined BP 141/93 which is normal for pt per pt and wife report. Supine to sit with close Supervision, use of bedrail. Pt reports 3/10 dizziness in sitting, seated BP 110/84. Assisted pt with donning knee-high TEDs due to drop in BP with position change. Squat pivot transfer to the R with min A to w/c. Dependent transport via TIS to/from therapy gym. Session focus on upright tolerance and gait training on LiteGait over treadmill. Pt is able to ambulate:  x 2.5 min at 0.7 mph, x 154 ft (BP 129/97) x 2.5 min at 0.7 mph, x 155 ft  (BP 128/91)  on LiteGait. Pt requires cues to narrow BOS with RLE, for BLE heel strike, and for decreased lateral weight shift during gait. Pt fatigues very quickly during gait training, declines further rounds after completing 2. Pt requesting to return to bed due to fatigue, agreeable to attempt toileting prior to returning to bed. Sit to stand with min A to stedy. Stedy transfer to toilet. Pt is dependent for clothing management and pericare in standing, continent of BM on BSC over toilet. See Flowsheet for details. Stedy transfer back to bed. Sit to supine Supervision. Pt left seated in bed with needs in reach, bed alarm in place, wife present.  Provided home measurement sheet for wife to complete and discussed typical family education schedule and next level of service likely home health therapies pending progress.  Therapy Documentation Precautions:  Precautions Precautions: Fall Precaution Comments: diplopia, Ataxic R; L side numbness Restrictions Weight Bearing Restrictions: No    Therapy/Group: Individual Therapy   Peter Congo, PT, DPT, CSRS  09/16/2021, 5:16 PM

## 2021-09-17 DIAGNOSIS — I639 Cerebral infarction, unspecified: Secondary | ICD-10-CM | POA: Diagnosis not present

## 2021-09-17 LAB — CBC
HCT: 36.2 % — ABNORMAL LOW (ref 39.0–52.0)
Hemoglobin: 12.5 g/dL — ABNORMAL LOW (ref 13.0–17.0)
MCH: 32.1 pg (ref 26.0–34.0)
MCHC: 34.5 g/dL (ref 30.0–36.0)
MCV: 92.8 fL (ref 80.0–100.0)
Platelets: 312 10*3/uL (ref 150–400)
RBC: 3.9 MIL/uL — ABNORMAL LOW (ref 4.22–5.81)
RDW: 13.4 % (ref 11.5–15.5)
WBC: 7.5 10*3/uL (ref 4.0–10.5)
nRBC: 0 % (ref 0.0–0.2)

## 2021-09-17 LAB — BASIC METABOLIC PANEL
Anion gap: 7 (ref 5–15)
BUN: 15 mg/dL (ref 8–23)
CO2: 25 mmol/L (ref 22–32)
Calcium: 9.1 mg/dL (ref 8.9–10.3)
Chloride: 101 mmol/L (ref 98–111)
Creatinine, Ser: 0.91 mg/dL (ref 0.61–1.24)
GFR, Estimated: >60 mL/min (ref >60)
Glucose, Bld: 110 mg/dL — ABNORMAL HIGH (ref 70–99)
Potassium: 4.1 mmol/L (ref 3.5–5.1)
Sodium: 133 mmol/L — ABNORMAL LOW (ref 135–145)

## 2021-09-17 MED ORDER — BETHANECHOL CHLORIDE 25 MG PO TABS
25.0000 mg | ORAL_TABLET | Freq: Three times a day (TID) | ORAL | Status: DC
  Administered 2021-09-17 – 2021-09-18 (×3): 25 mg via ORAL
  Filled 2021-09-17 (×3): qty 1

## 2021-09-17 NOTE — Progress Notes (Signed)
Physical Therapy Session Note  Patient Details  Name: Maurice Little MRN: [id number] Date of Birth: 10/12/1956  Today's Date: 09/17/2021 PT Individual Time: 4098-1191; 4782-9562 PT Individual Time Calculation (min): 55 min and 40 mins  Short Term Goals: Week 2:  PT Short Term Goal 1 (Week 2): Pt will perform sit<>stands with min assist using LRAD consistently PT Short Term Goal 2 (Week 2): Pt will perform stand pivot transfers using LRAD with mod assist consistently PT Short Term Goal 3 (Week 2): Pt will ambulate at least 52ft using LRAD with mod assist of 1 PT Short Term Goal 4 (Week 2): Pt will initiate stair training  Skilled Therapeutic Interventions/Progress Updates:    Session 1: Patient received sitting up in bed, reluctantly agreeable to PT. He reports mild pain in form of bladder spasms, premedicated. RN performing in/out cath. PT proving education to patients wife re: progress thus far, remaining deficits, need for wc upon dc. Patient reporting feeling very "down" today and initially requested to not participate in PT, but was ultimately agreeable to getting out of bed. CGA provided for bed mobility. Improving static sitting balance EOB with less evidence of R lateral lean in sitting. MinA stand pivot to wc. MD in/out for AM assessment. PT taking patient off unit to assist with mood with good results. Providing patient with education regarding recovery process from CVA and progress thus far. Patient remaining up in wc, wife at bedside, needs within reach.   Session 2: Patient received sitting up in wc, agreeable to PT. He denies pain, but endorses fatigue. PT transporting patient in wc to therapy gym for time management energy conservation. He transferred to mat via stand pivot with MinA. Resisted sit <> stand 2x5 with B UE support on EVA walker. He did require extended seated rest breaks due to fatigue. He ambulated 61ft with EVA walker + ModA including assist with managing walker. Ambulated  additional 52ft with resistance and much improved truncal ataxia noted and reciprocal stepping- smoother gait observed. Patient ambulating final 30 ft with EVA walker and no resistance lighter ModA and maintained improved truncal ataxia and smoother gait, but did require cues for equal B step length. Patient returning to room in wc, transferring to bed via stand pivot with MinA per RN request for in/out cath. Patient remaining in bed, bed alarm on, call light within reach.    Therapy Documentation Precautions:  Precautions Precautions: Fall Precaution Comments: diplopia, Ataxic R; L side numbness Restrictions Weight Bearing Restrictions: No    Therapy/Group: Individual Therapy  Elizebeth Koller, PT, DPT, CBIS  09/17/2021, 7:32 AM

## 2021-09-17 NOTE — Progress Notes (Signed)
Occupational Therapy Session Note  Patient Details  Name: Maurice Little MRN: [id number] Date of Birth: [birth year]  Today's Date: 09/17/2021 OT Individual Time: 7253-6644 OT Individual Time Calculation (min): 56 min    Short Term Goals: Week 2:  OT Short Term Goal 1 (Week 2): Pt will complete toilet transfers with min assist sit to stand. OT Short Term Goal 2 (Week 2): Pt will maintain dynamic sitting balance at min guard assist during selfcare tasks sitting unsupported. OT Short Term Goal 3 (Week 2): Pt will complete LB dressing with min assist for two consecutive sessions.  Skilled Therapeutic Interventions/Progress Updates:    Pt in bed to start session agreeable to trying to complete toilet transfer and toileting.  He was able to transfer to the EOB with supervision and then ambulated to the toilet with max assist and no assistive device.  Increased right lean with LOB noted as well as moderate ataxia in the RLE when attempting to step.  He was able to manage his clothing with mod assist sit to stand as well as complete toilet hygiene in standing.  Once complete, he transferred out to the wheelchair at [facility] assist level and washed his hands at the sink from seated position with supervision.  He was able to then transfer down to a mat in the therapy gym at min assist stand pivot.  Focused rest of session on static standing balance and transitional movements sit to stand without use of his UEs.  He needed min assist for sit to stand with mod assist for static standing balance while holding therapy ball.  Decreased forward weightshift for stand to sit transition is noted with posterior LOB and uncontrolled descent.  Finished session with pt transferring back to the wheelchair at min assist stand pivot with return to the room.  He was left sitting up in the wheelchair with the call button and phone in reach and safety alarm belt in place.    Therapy Documentation Precautions:  Precautions Precautions:  Fall Precaution Comments: diplopia, Ataxic R; L side numbness Restrictions Weight Bearing Restrictions: No  Pain: Pain Assessment Pain Scale: Faces Pain Score: 0-No pain ADL: See Care Tool Section for some details of mobility and selfcare  Therapy/Group: Individual Therapy  Ludwika Rodd OTR/L 09/17/2021, 3:56 PM

## 2021-09-17 NOTE — Progress Notes (Signed)
Occupational Therapy Session Note  Patient Details  Name: Maurice Little MRN: [id number] Date of Birth: 02-Jan-1956  Today's Date: 09/17/2021 OT Individual Time: 1030-1100 OT Individual Time Calculation (min): 30 min    Short Term Goals: Week 2:  OT Short Term Goal 1 (Week 2): Pt will complete toilet transfers with min assist sit to stand. OT Short Term Goal 2 (Week 2): Pt will maintain dynamic sitting balance at min guard assist during selfcare tasks sitting unsupported. OT Short Term Goal 3 (Week 2): Pt will complete LB dressing with min assist for two consecutive sessions.  Skilled Therapeutic Interventions/Progress Updates:    Pt received in wc in room ready for therapy. Pt taken to main gym. Asked pt to complete a sq pivot to mat to his L which he was able to do but with more of a scoot as he did not shift forward and elevate his hips.  Used this session to focus on strength and control with legs/hips for transfers and standing: -sit to squat with pushing up with B hands and isometric lowering -progressed to pushing up with hands and placing hands on thighs then lowering to sit with legs only -progressed from squat to stand Pt has a tendency to lean R as he pushes to R with L leg. Focused on visual and tactile cues to keep pt in midline through transition movements. Pt responded well to these cues and was able to move sit to stand with CGA keeping in midline. Further progressed to sit to stand with pt holding large ball so he had to force use of his legs.   From sitting, hip add with ball squeezes, pec strengthening with ball squeezes, holding ball in hands and doing cross chops 12 x to each side for trunk rotation and elongation.   Transferred back to wc to R with squat pivot with good control to keep hips lifted and completed a full turn to wc.  In room requested to get back to bed, sq pivot with strong technique/ good form to get to bed with light CGA.  Moved to supine without A.  In  room with wife present.   Therapy Documentation Precautions:  Precautions Precautions: Fall Precaution Comments: diplopia, Ataxic R; L side numbness Restrictions Weight Bearing Restrictions: No Vital Signs: Therapy Vitals Temp: 98 F (36.7 C) Temp Source: Oral Pulse Rate: 96 Resp: 18 BP: (!) 141/99 Patient Position (if appropriate): Lying Oxygen Therapy SpO2: 96 % O2 Device: Room Air Pain:   ADL: ADL Eating: Maximal assistance Where Assessed-Eating: Bed level Grooming: Maximal assistance Where Assessed-Grooming: Bed level Upper Body Bathing: Minimal assistance Where Assessed-Upper Body Bathing: Edge of bed Lower Body Bathing: Maximal assistance Where Assessed-Lower Body Bathing: Edge of bed Upper Body Dressing: Minimal assistance Where Assessed-Upper Body Dressing: Edge of bed Lower Body Dressing: Maximal assistance Where Assessed-Lower Body Dressing: Edge of bed Toileting: Dependent Where Assessed-Toileting: Bedside Commode Toilet Transfer: Maximal assistance Toilet Transfer Method: Stand pivot Acupuncturist: Animator Transfer: Not assessed Film/video editor: Not assessed   Therapy/Group: Individual Therapy  Graylon Amory 09/17/2021, 8:59 AM

## 2021-09-17 NOTE — Progress Notes (Signed)
PROGRESS NOTE   Subjective/Complaints:  No issues overnite , still has urinary retention , has some pressure like feeling over bladder but still unable to void  ROS:  +Left sided numbness. Denies CP, SOB, N/V/D   Objective:   No results found. Recent Labs    09/17/21 0519  WBC 7.5  HGB 12.5*  HCT 36.2*  PLT 312   Recent Labs    09/17/21 0519  NA 133*  K 4.1  CL 101  CO2 25  GLUCOSE 110*  BUN 15  CREATININE 0.91  CALCIUM 9.1    Intake/Output Summary (Last 24 hours) at 09/17/2021 0826 Last data filed at 09/16/2021 2138 Gross per 24 hour  Intake 600 ml  Output 450 ml  Net 150 ml         Physical Exam: Vital Signs Blood pressure (!) 141/99, pulse 96, temperature 98 F (36.7 C), temperature source Oral, resp. rate 18, height 6' (1.829 m), SpO2 96 %.  General: No acute distress Mood and affect are appropriate Heart: Regular rate and rhythm no rubs murmurs or extra sounds Lungs: Clear to auscultation, breathing unlabored, no rales or wheezes Abdomen: Positive bowel sounds, soft nontender to palpation, nondistended Extremities: No clubbing, cyanosis, or edema Skin: No evidence of breakdown, no evidence of rash  Neuro: Alert Motor: 5/5 bilateral delt, bi, tri, grip, HF, KE ADF  Right facial weakness , diplopia with RIght lateral gaze  Dysarthria  Mild LUE ataxia Mild RUE dysmetria  Assessment/Plan: 1. Functional deficits which require 3+ hours per day of interdisciplinary therapy in a comprehensive inpatient rehab setting. Physiatrist is providing close team supervision and 24 hour management of active medical problems listed below. Physiatrist and rehab team continue to assess barriers to discharge/monitor patient progress toward functional and medical goals  Care Tool:  Bathing    Body parts bathed by patient: Right arm, Left arm, Chest, Abdomen, Right upper leg, Left upper leg, Right lower leg,  Left lower leg, Face, Front perineal area, Buttocks   Body parts bathed by helper: Front perineal area, Buttocks     Bathing assist Assist Level: Minimal Assistance - Patient > 75%     Upper Body Dressing/Undressing Upper body dressing   What is the patient wearing?: Pull over shirt    Upper body assist Assist Level: Supervision/Verbal cueing    Lower Body Dressing/Undressing Lower body dressing      What is the patient wearing?: Pants, Incontinence brief     Lower body assist Assist for lower body dressing: Minimal Assistance - Patient > 75%     Toileting Toileting Toileting Activity did not occur (Clothing management and hygiene only): N/A (no void or bm) (foley in place)  Toileting assist Assist for toileting: Maximal Assistance - Patient 25 - 49%     Transfers Chair/bed transfer  Transfers assist     Chair/bed transfer assist level: Moderate Assistance - Patient 50 - 74% (stand pivot)     Locomotion Ambulation   Ambulation assist   Ambulation activity did not occur: Safety/medical concerns (requires +2 and skilled assist with use of hallway rail)  Assist level: 2 helpers Assistive device: Lite Gait Max distance: 152ft  Walk 10 feet activity   Assist  Walk 10 feet activity did not occur: Safety/medical concerns        Walk 50 feet activity   Assist Walk 50 feet with 2 turns activity did not occur: Safety/medical concerns         Walk 150 feet activity   Assist Walk 150 feet activity did not occur: Safety/medical concerns         Walk 10 feet on uneven surface  activity   Assist Walk 10 feet on uneven surfaces activity did not occur: Safety/medical concerns         Wheelchair     Assist Is the patient using a wheelchair?: Yes   Wheelchair activity did not occur: Safety/medical concerns         Wheelchair 50 feet with 2 turns activity    Assist            Wheelchair 150 feet activity     Assist           Blood pressure (!) 141/99, pulse 96, temperature 98 F (36.7 C), temperature source Oral, resp. rate 18, height 6' (1.829 m), SpO2 96 %.    Medical Problem List and Plan: 1.  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  Antithrombotics: -DVT/anticoagulation:  Pharmaceutical: Lovenox added.              -antiplatelet therapy: ASA/Brilinta. 3. Pain Management: Tylenol prn for HA.  4. Mood: LCSW to follow for evaluation and support.              -antipsychotic agents: N/A 5. Neuropsych: This patient is capable of making decisions on his own behalf. 6. Skin/Wound Care: Routine pressure-relief measures. 7. Fluids/Electrolytes/Nutrition: Monitor I's/Os 8.  HTN with new hypotension/orthostaitc: Monitor BP TID.   Decreased Norvasc to 5 mg daily TEDs and ACE wraps and see how pt does.  Decreased hydralazine to 10 mg TID   Vitals:   09/16/21 1945 09/17/21 0614  BP: 113/79 (!) 141/99  Pulse: 93 96  Resp: 14 18  Temp: 98.6 F (37 C) 98 F (36.7 C)  SpO2: 97% 96%   Flomax increased Losartan 25 daily started on 11/20 9.  Urinary retention: may have pontine micturition center involvement +/- BPH,  Continue Flomax Cont voiding trial tolerating caths  10. Constipation: increased Senna S 2 po BID .   Miralax scheduled  Enema ordered per family request 11.  Leukocytosis: Resolved 12.  Intermittent hypokalemia: Resolved 13.  Hyperglycemia: Likely stress related as Hemoglobin A1c 5.5. 14.  Dyslipidemia: Continue Lipitor 15. 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 16. Unable to close right eye lid. Peripheral 7 Right - c/o "film over R eye" which is normal  with lacrilube  -ordered eye patch right eye  -eye drops. May keep at bedside  LOS: 13 days A FACE TO FACE EVALUATION WAS PERFORMED  Maurice Little 09/17/2021, 8:26 AM

## 2021-09-18 DIAGNOSIS — I639 Cerebral infarction, unspecified: Secondary | ICD-10-CM | POA: Diagnosis not present

## 2021-09-18 LAB — URINALYSIS, ROUTINE W REFLEX MICROSCOPIC
Bilirubin Urine: NEGATIVE
Glucose, UA: NEGATIVE mg/dL
Ketones, ur: NEGATIVE mg/dL
Leukocytes,Ua: NEGATIVE
Nitrite: POSITIVE — AB
Protein, ur: NEGATIVE mg/dL
Specific Gravity, Urine: 1.01 (ref 1.005–1.030)
pH: 5 (ref 5.0–8.0)

## 2021-09-18 MED ORDER — FLAVOXATE HCL 100 MG PO TABS
100.0000 mg | ORAL_TABLET | Freq: Three times a day (TID) | ORAL | Status: DC
  Administered 2021-09-18 – 2021-09-20 (×7): 100 mg via ORAL
  Filled 2021-09-18 (×9): qty 1

## 2021-09-18 MED ORDER — TAMSULOSIN HCL 0.4 MG PO CAPS
0.4000 mg | ORAL_CAPSULE | Freq: Every day | ORAL | Status: DC
  Administered 2021-09-18 – 2021-09-26 (×9): 0.4 mg via ORAL
  Filled 2021-09-18 (×9): qty 1

## 2021-09-18 NOTE — Progress Notes (Signed)
Occupational Therapy Weekly Progress Note  Patient Details  Name: Maurice Little MRN: [id number] Date of Birth: 1956-09-19  Beginning of progress report period: September 12, 2021 End of progress report period: September 18, 2021  Today's Date: 09/18/2021 OT Individual Time: 0071-2197 OT Individual Time Calculation (min): 59 min    Patient has met 0 of 3 short term goals.  Pt is making steady progress with OT at this time with regards to ADL performance.  He is able to complete UB selfcare in sitting with setup shower level and for dressing sitting EOB.  LB bathing is currently at a min assist level in sitting but mod assist for sit to stand secondary to ataxia and increased right lean/LOB.  He is able to complete LB dressing at mod assist level from the EOB.  Dynamic sitting balance is at min assist level with dressing with pt demonstrating increased LOB to the right when attempting to cross the LLE over the right knee during LB dressing tasks. Transfers are mod assist stand pivot with max assist for functional mobility short distances in the room without use of an assistive device.  Mod to max assist is needed for mobility with use of RW for support.  Diplopia has improved with pt only reporting double vision in the far lateral right field at this time.  He continues with moderate horizontal nystagmus with gaze and tracking as well as decreased right lateral AROM in the right eye.  He still demonstrates increased dizziness which remains at a 3/10 in sitting and supine and increases up to a 6/10 with standing and movements. Pt continues with decreased overall endurance as well as RUE and RLE ataxia.  This ataxia has improved from eval with regards to the RUE and its functional use at a diminshed level for tying shoes and donning other clothing.  Feel he is on target for established supervision to min guard assist goals currently.  Recommend continued CIR level therapy to continue progression with ongoing  education with pt and spouse.  Planned discharge currently on 12/1.     Patient continues to demonstrate the following deficits: muscle weakness and muscle paralysis, impaired timing and sequencing, unbalanced muscle activation, ataxia, decreased coordination, and decreased motor planning, central origin, and decreased sitting balance, decreased standing balance, decreased postural control, hemiplegia, and decreased balance strategies and therefore will continue to benefit from skilled OT intervention to enhance overall performance with BADL.  Patient progressing toward long term goals..  Continue plan of care.  OT Short Term Goals Week 3:  OT Short Term Goal 1 (Week 3): Pt will complete toilet transfers with min assist sit to stand. OT Short Term Goal 2 (Week 3): Pt will maintain dynamic sitting balance at min guard assist during selfcare tasks sitting unsupported. OT Short Term Goal 3 (Week 3): Pt will complete LB dressing with min assist for two consecutive sessions.  Skilled Therapeutic Interventions/Progress Updates:    Pt worked on bathing and dressing during session.  Increased fatigue was reported by pt from PT earlier, but he was agreeable to try as hard as he could.  Max assist for functional mobility from the wheelchair at bedside to the shower bench.  Increased right lean with LOB as well as trunk and RLE ataxia noted with mobility.  Once to the shower bench, he was able to remove most clothing with min assist sit to stand and use of a grab bar on the left side for support when standing.  He completed all  bathing in sitting with min guard assist, completing lateral leans for washing buttocks instead of standing.  He dried off and then completed ambulation out to the bed for dressing at max assist level without an assistive device.  He was able to donn a pullover shirt with supervision.  He then donned his brief and pants sit to stand with mod assist.  He needed min assist for balance when  crossing the LLE over the right knee to donn his gripper sock secondary to LOB to the right.  Finished session with transfer to supine once dressing was finished in order to rest for PT session later today.  Encouraged completion of visual tracking and gaze stabilization exercises later today outside of therapy.  Also encouraged more use of the RUE for self feeding.    Therapy Documentation Precautions:  Precautions Precautions: Fall Precaution Comments: diplopia, Ataxic R; L side numbness Restrictions Weight Bearing Restrictions: No  Pain: Pain Assessment Pain Scale: Faces Pain Score: 0-No pain ADL: See Care Tool Section for some details of mobility and selfcare   Therapy/Group: Individual Therapy  Santina Trillo OTR/L 09/18/2021, 12:46 PM

## 2021-09-18 NOTE — Progress Notes (Signed)
Physical Therapy Session Note  Patient Details  Name: Maurice Little MRN: [id number] Date of Birth: 12/16/1955  Today's Date: 09/18/2021 PT Individual Time: [phone] and [id number] PT Individual Time Calculation (min): 79 min and 55 min  Short Term Goals: Week 2:  PT Short Term Goal 1 (Week 2): Pt will perform sit<>stands with min assist using LRAD consistently PT Short Term Goal 2 (Week 2): Pt will perform stand pivot transfers using LRAD with mod assist consistently PT Short Term Goal 3 (Week 2): Pt will ambulate at least 92ft using LRAD with mod assist of 1 PT Short Term Goal 4 (Week 2): Pt will initiate stair training  Skilled Therapeutic Interventions/Progress Updates:    Session 1: Pt received supine in bed with his wife present - nurse present for morning medication administration. Pt requesting to see MD regarding foley, but agreeable to therapy session. Supine>sitting L EOB, HOB slightly elevated but not using bedrails, close supervision for safety and pt using B UE support to maintain sitting balance as needed. MD in/out for morning assessment. R stand pivot EOB>TIS w/c heavy min assist for balance while coming to stand and turning due to increased R lean.  Transported to/from gym in w/c for time management and energy conservation.   Gait training 131ft 3x (seated break between) using Carley Hammed walker (back locks unlocked for more degrees of freedom to increase challenge) with +2 assist for AD management due to pt causing strong R lean and having AD "fishtail" out to the R - demos R LE ataxia with excessive hip adduction and movement in a flexor synergy pattern with ankle/foot supinated position but not to the degree of rolling his ankle - 135bpm (~80% of HR max) improved to 115bpm with seated rest - progressed to 2 x 90degree turns on 3rd walk targeting L turns to force improved R LE foot positioning to prevent R LOB while turning.  Gait training ~178ft 2x using RW with +2 assist for AD  management to prevent it from tipping over to the R and to keep it from going too fast forward (on 2nd walk pt able to make it ~56ft without this assist but +2 guarding for safety) - therapist providing mod assist for balance due to intermittent R posterior LOB due to not advancing pelvis forward over R stance phase - cuing for improved R LE foot positioning to keep it abducted for wider BOS - pt achieves reciprocal stepping pattern but not too larger as to maintain balance - 138bpm after reducing to 117bpm with seated break.  Dynamic standing balance task with cross body reaching via squating low to grab a card then standing up to rotate and place it on it's match - also addressing visual scanning - min assist for balance due to anterior LOB when reaching.  Transported back to room and agreeable to remain sitting up in TIS w/c until next session - left with needs in reach and his wife present.   Session 2: Pt received asleep, supine in bed but easily awakens and agreeable to therapy session. Pt reports feeling "rested" now. Supine>sitting L EOB, HOB slightly elevated and using bedrail, pt with increased difficulty bringing trunk forward and scooting hips forward at this time (leaning more posteriorly). R stand pivot EOB>w/c, no AD, with heavy mod assist for balance due to strong R lateral lean and delayed R lateral stepping to achieve transfer - anticipate this is due to having just woken up and pt reorienting to upright.  Transported to/from gym in  w/c for time management and energy conservation.  Sit>stand w/c>RW with min assist for balance due to R posterior lean. Gait training 62ft using RW +2 mod A for balance and AD management - continues to have strong R posterior lean due to lack of sufficient L anterior weight shift onto L stance limb along with R LE ataxia - achieved 2x 90 degree turns during this requiring +2 assist for AD management and continued heavy mod assist for balance - overall pt  requiring slight increase in assist compared to AM session likely due to fatigue.   Stair navigation training ascending/descending 4, 6" steps 2x using B HRs with +2 min/mod assist for balance - reciprocal pattern on ascent and step-to pattern on descent leading with L LE - on 2nd attempt only required +2 min assist due to improved balance when turning as that is when pt typically had LOB.   Pt reporting fatigue.  L stand pivot w/c>EOB heavy min assist for balance due to continued R lateral lean.   Dynamic standing balance task via and tossing a large ball to +2 assist - requires CGA/min assist for balance with pt having wide BOS making it easier to maintain his balance.   L stand pivot back to w/c and then back to EOB at end of session as described above. Sit>supine supervision using bedrail. Pt left supine in bed with needs in reach and bed alarm on.  Of note: pt with dark like purple/deep red colored urine in foley - nurse notified and present to assess   Therapy Documentation Precautions:  Precautions Precautions: Fall Precaution Comments: diplopia, Ataxic R; L side numbness Restrictions Weight Bearing Restrictions: No   Pain:  Session 1: No reports of pain throughout session.  Session 2: Reports some headache pain - nurse notified for medication administration.    Therapy/Group: Individual Therapy  Ginny Forth , PT, DPT, NCS, CSRS  09/18/2021, 7:50 AM

## 2021-09-18 NOTE — Progress Notes (Signed)
PROGRESS NOTE   Subjective/Complaints:  No voids, uncomfortable with Caths  ROS:  +Left sided numbness. Denies CP, SOB, N/V/D   Objective:   No results found. Recent Labs    09/17/21 0519  WBC 7.5  HGB 12.5*  HCT 36.2*  PLT 312    Recent Labs    09/17/21 0519  NA 133*  K 4.1  CL 101  CO2 25  GLUCOSE 110*  BUN 15  CREATININE 0.91  CALCIUM 9.1     Intake/Output Summary (Last 24 hours) at 09/18/2021 0811 Last data filed at 09/18/2021 0750 Gross per 24 hour  Intake 480 ml  Output 2231 ml  Net -1751 ml         Physical Exam: Vital Signs Blood pressure (!) 137/93, pulse 93, temperature 98.3 F (36.8 C), resp. rate 14, height 6' (1.829 m), SpO2 96 %.  General: No acute distress Mood and affect are appropriate Heart: Regular rate and rhythm no rubs murmurs or extra sounds Lungs: Clear to auscultation, breathing unlabored, no rales or wheezes Abdomen: Positive bowel sounds, soft nontender to palpation, nondistended Extremities: No clubbing, cyanosis, or edema Skin: No evidence of breakdown, no evidence of rash  Neuro: Alert Motor: 5/5 bilateral delt, bi, tri, grip, HF, KE ADF  Right facial weakness , diplopia with RIght lateral gaze  Dysarthria  Mild LUE ataxia Mild RUE dysmetria  Assessment/Plan: 1. Functional deficits which require 3+ hours per day of interdisciplinary therapy in a comprehensive inpatient rehab setting. Physiatrist is providing close team supervision and 24 hour management of active medical problems listed below. Physiatrist and rehab team continue to assess barriers to discharge/monitor patient progress toward functional and medical goals  Care Tool:  Bathing    Body parts bathed by patient: Right arm, Left arm, Chest, Abdomen, Right upper leg, Left upper leg, Right lower leg, Left lower leg, Face, Front perineal area, Buttocks   Body parts bathed by helper: Front perineal  area, Buttocks     Bathing assist Assist Level: Minimal Assistance - Patient > 75%     Upper Body Dressing/Undressing Upper body dressing   What is the patient wearing?: Pull over shirt    Upper body assist Assist Level: Supervision/Verbal cueing    Lower Body Dressing/Undressing Lower body dressing      What is the patient wearing?: Pants, Incontinence brief     Lower body assist Assist for lower body dressing: Minimal Assistance - Patient > 75%     Toileting Toileting Toileting Activity did not occur (Clothing management and hygiene only): N/A (no void or bm) (foley in place)  Toileting assist Assist for toileting: Moderate Assistance - Patient 50 - 74%     Transfers Chair/bed transfer  Transfers assist     Chair/bed transfer assist level: Moderate Assistance - Patient 50 - 74% (stand pivot)     Locomotion Ambulation   Ambulation assist   Ambulation activity did not occur: Safety/medical concerns (requires +2 and skilled assist with use of hallway rail)  Assist level: 2 helpers Assistive device: Lite Gait Max distance: 156ft   Walk 10 feet activity   Assist  Walk 10 feet activity did not occur: Safety/medical concerns  Walk 50 feet activity   Assist Walk 50 feet with 2 turns activity did not occur: Safety/medical concerns         Walk 150 feet activity   Assist Walk 150 feet activity did not occur: Safety/medical concerns         Walk 10 feet on uneven surface  activity   Assist Walk 10 feet on uneven surfaces activity did not occur: Safety/medical concerns         Wheelchair     Assist Is the patient using a wheelchair?: Yes   Wheelchair activity did not occur: Safety/medical concerns         Wheelchair 50 feet with 2 turns activity    Assist            Wheelchair 150 feet activity     Assist          Blood pressure (!) 137/93, pulse 93, temperature 98.3 F (36.8 C), resp. rate 14,  height 6' (1.829 m), SpO2 96 %.    Medical Problem List and Plan: 1.  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  Antithrombotics: -DVT/anticoagulation:  Pharmaceutical: Lovenox added.              -antiplatelet therapy: ASA/Brilinta. 3. Pain Management: Tylenol prn for HA.  4. Mood: LCSW to follow for evaluation and support.              -antipsychotic agents: N/A 5. Neuropsych: This patient is capable of making decisions on his own behalf. 6. Skin/Wound Care: Routine pressure-relief measures. 7. Fluids/Electrolytes/Nutrition: Monitor I's/Os 8.  HTN with new hypotension/orthostaitc: Monitor BP TID.   Decreased Norvasc to 5 mg daily TEDs and ACE wraps and see how pt does.  Decreased hydralazine to 10 mg TID   Vitals:   09/17/21 1945 09/18/21 0334  BP: 123/90 (!) 137/93  Pulse: 85 93  Resp: 14 14  Temp: 98 F (36.7 C) 98.3 F (36.8 C)  SpO2: 99% 96%   Flomax increased Losartan 25 daily started on 11/20 9.  Urinary retention: may have pontine micturition center involvement +/- BPH,  Continue Flomax Cont voiding trial tolerating caths  10. Constipation: increased Senna S 2 po BID .   Miralax scheduled  Enema ordered per family request 11.  Leukocytosis: Resolved 12.  Intermittent hypokalemia: Resolved 13.  Hyperglycemia: Likely stress related as Hemoglobin A1c 5.5. 14.  Dyslipidemia: Continue Lipitor 15. 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 16. Unable to close right eye lid. Peripheral 7 Right - c/o "film over R eye" which is normal with lacrilube  -ordered eye patch right eye  -eye drops. May keep at bedside  LOS: 14 days A FACE TO FACE  EVALUATION WAS PERFORMED  Maurice Little 09/18/2021, 8:11 AM

## 2021-09-18 NOTE — Progress Notes (Signed)
Pt worried about I/o cath. Stated is too much for him and will prefer indwelling foley catheter. Educated pt and assured him provider will be notified.

## 2021-09-19 ENCOUNTER — Inpatient Hospital Stay (HOSPITAL_COMMUNITY): Payer: 59

## 2021-09-19 DIAGNOSIS — I639 Cerebral infarction, unspecified: Secondary | ICD-10-CM | POA: Diagnosis not present

## 2021-09-19 MED ORDER — TRAMADOL HCL 50 MG PO TABS: 50.0000 mg | ORAL_TABLET | Freq: Four times a day (QID) | ORAL | Status: DC | PRN

## 2021-09-19 MED ORDER — HYOSCYAMINE SULFATE 0.125 MG SL SUBL
0.1250 mg | SUBLINGUAL_TABLET | Freq: Four times a day (QID) | SUBLINGUAL | Status: DC | PRN
  Administered 2021-09-19 – 2021-09-20 (×4): 0.125 mg via ORAL
  Filled 2021-09-19 (×5): qty 1

## 2021-09-19 MED ORDER — HYDROCODONE-ACETAMINOPHEN 7.5-325 MG PO TABS
1.0000 | ORAL_TABLET | Freq: Four times a day (QID) | ORAL | Status: DC | PRN
  Administered 2021-09-24: 21:00:00 1 via ORAL
  Filled 2021-09-19: qty 1

## 2021-09-19 NOTE — Progress Notes (Addendum)
PROGRESS NOTE   Subjective/Complaints: Foley reinserted , pt states that it is uncomfortable , there was blood with reinsertion, but tubing is clear,small amt sediment in foley bag ROS:  +Left sided numbness. Denies CP, SOB, N/V/D   Objective:   No results found. Recent Labs    09/17/21 0519  WBC 7.5  HGB 12.5*  HCT 36.2*  PLT 312    Recent Labs    09/17/21 0519  NA 133*  K 4.1  CL 101  CO2 25  GLUCOSE 110*  BUN 15  CREATININE 0.91  CALCIUM 9.1     Intake/Output Summary (Last 24 hours) at 09/19/2021 0750 Last data filed at 09/19/2021 0175 Gross per 24 hour  Intake 480 ml  Output 2100 ml  Net -1620 ml         Physical Exam: Vital Signs Blood pressure 139/89, pulse 93, temperature 98 F (36.7 C), resp. rate 14, height 6' (1.829 m), SpO2 97 %.  General: No acute distress Mood and affect are appropriate Heart: Regular rate and rhythm no rubs murmurs or extra sounds Lungs: Clear to auscultation, breathing unlabored, no rales or wheezes Abdomen: Positive bowel sounds,  nondistended, , pain with suprapubic palpation  Extremities: No clubbing, cyanosis, or edema Skin: No evidence of breakdown, no evidence of rash  Neuro: Alert Motor: 5/5 bilateral delt, bi, tri, grip, HF, KE ADF  Right facial weakness , diplopia with RIght lateral gaze  Dysarthria  Mild LUE ataxia Mild RUE dysmetria  Assessment/Plan: 1. Functional deficits which require 3+ hours per day of interdisciplinary therapy in a comprehensive inpatient rehab setting. Physiatrist is providing close team supervision and 24 hour management of active medical problems listed below. Physiatrist and rehab team continue to assess barriers to discharge/monitor patient progress toward functional and medical goals  Care Tool:  Bathing    Body parts bathed by patient: Right arm, Left arm, Chest, Abdomen, Right upper leg, Left upper leg, Right lower  leg, Left lower leg, Face, Front perineal area, Buttocks   Body parts bathed by helper: Front perineal area, Buttocks     Bathing assist Assist Level: Minimal Assistance - Patient > 75%     Upper Body Dressing/Undressing Upper body dressing   What is the patient wearing?: Pull over shirt    Upper body assist Assist Level: Supervision/Verbal cueing    Lower Body Dressing/Undressing Lower body dressing      What is the patient wearing?: Pants, Incontinence brief     Lower body assist Assist for lower body dressing: Minimal Assistance - Patient > 75%     Toileting Toileting Toileting Activity did not occur (Clothing management and hygiene only): N/A (no void or bm) (foley in place)  Toileting assist Assist for toileting: Moderate Assistance - Patient 50 - 74%     Transfers Chair/bed transfer  Transfers assist     Chair/bed transfer assist level: Moderate Assistance - Patient 50 - 74% (stand pivot)     Locomotion Ambulation   Ambulation assist   Ambulation activity did not occur: Safety/medical concerns (requires +2 and skilled assist with use of hallway rail)  Assist level: 2 helpers (+2 mod A) Assistive device: Walker-rolling Max  distance: 180f   Walk 10 feet activity   Assist  Walk 10 feet activity did not occur: Safety/medical concerns        Walk 50 feet activity   Assist Walk 50 feet with 2 turns activity did not occur: Safety/medical concerns         Walk 150 feet activity   Assist Walk 150 feet activity did not occur: Safety/medical concerns         Walk 10 feet on uneven surface  activity   Assist Walk 10 feet on uneven surfaces activity did not occur: Safety/medical concerns         Wheelchair     Assist Is the patient using a wheelchair?: Yes   Wheelchair activity did not occur: Safety/medical concerns         Wheelchair 50 feet with 2 turns activity    Assist            Wheelchair 150 feet  activity     Assist          Blood pressure 139/89, pulse 93, temperature 98 F (36.7 C), resp. rate 14, height 6' (1.829 m), SpO2 97 %.    Medical Problem List and Plan: 1.  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 Formulized individual treatment plan based on medical history, underlying problem and comorbidities.   -no new CVA per neuro/CT head , stents are clear   Insurance forms completed per request 2.  Antithrombotics: -DVT/anticoagulation:  Pharmaceutical: Lovenox added.              -antiplatelet therapy: ASA/Brilinta. 3. Pain Management: Tylenol prn for HA.  4. Mood: LCSW to follow for evaluation and support.              -antipsychotic agents: N/A 5. Neuropsych: This patient is capable of making decisions on his own behalf. 6. Skin/Wound Care: Routine pressure-relief measures. 7. Fluids/Electrolytes/Nutrition: Monitor I's/Os 8.  HTN with new hypotension/orthostaitc: Monitor BP TID.   Decreased Norvasc to 5 mg daily TEDs and ACE wraps and see how pt does.  Decreased hydralazine to 10 mg TID   Vitals:   09/18/21 1933 09/19/21 0402  BP: (!) 141/83 139/89  Pulse: 94 93  Resp: 14 14  Temp: 99.6 F (37.6 C) 98 F (36.7 C)  SpO2: 98% 97%   Flomax increased Losartan 25 daily started on 11/20 9.  Urinary retention: may have pontine micturition center involvement +/- BPH,  Continue Flomax Cont voiding trial tolerating caths  10. Constipation: increased Senna S 2 po BID .   Miralax scheduled  Enema ordered per family request 11.  Leukocytosis: Resolved 12.  Intermittent hypokalemia: Resolved 13.  Hyperglycemia: Likely stress related  as Hemoglobin A1c 5.5. 14.  Dyslipidemia: Continue Lipitor 15. 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 16. Unable to close right eye lid. Peripheral 7 Right - c/o "film over R eye" which is normal with lacrilube  -ordered eye patch right eye  -eye drops. May keep at bedside 17.  Pain with foley reinsertion, is draining clear urine, had some blood initially which is likely urethral irritation in combination with enoxaparin, check bladder scan ,  discussed with urology Dr Junious Silk on call. PVR 0 ml, discussed foley size 26f which is appropriate, suggested CT abd/pelvis to assess hematuria  Add hyoscyamine for bladder spasms LOS: 15 days A FACE TO FACE EVALUATION WAS PERFORMED  ACharlett Blake11/23/2022, 7:50 AM

## 2021-09-19 NOTE — Progress Notes (Signed)
Patient ID: Maurice Little, male   DOB: 11-09-1955, 65 y.o.   MRN: [id number] Team Conference Report to Patient/Family  Team Conference discussion was reviewed with the patient and caregiver, including goals, any changes in plan of care and target discharge date.  Patient and caregiver express understanding and are in agreement.  The patient has a target discharge date of 09/27/21.  SW met with pt and spouse. Provided conference information. Pt spouse has measurements information for therapy team. Sw provided copy of signed FMLA paperwork. SW will cont to follow up no additional questions or concerns  Dyanne Iha 09/19/2021, 1:12 PM

## 2021-09-19 NOTE — Progress Notes (Signed)
Physical Therapy Weekly Progress Note  Patient Details  Name: Maurice Little MRN: [id number] Date of Birth: May 21, 1956  Beginning of progress report period: September 12, 2021 End of progress report period: September 19, 2021  Today's Date: 09/19/2021 PT Individual Time: 6568-1275 PT Individual Time Calculation (min): 44 min   And  Today's Date: 09/19/2021 PT Missed Time: 31 Minutes Missed Time Reason: Pain  Patient has met 4 of 4 short term goals. Maurice Little is progressing well with therapy demonstrating increasing independence with functional mobility. He is performing supine<>sit with close supervision, sit<>stands with min assist, stand pivot transfers with primarily min assist but intermittent mod assist due to truncal ataxia/increased postural sway, ambulating up to 165f using RW with mod assist of 1 and +2 close supervision for safety (progressing to only requiring mod assist of 1) but all of these gait trials are in a controlled environment with selected number of turns. Pt has advanced to performing 4 stair navigation using B HRs with +2 min/mod assist for balance. Pt continues to have significant truncal ataxia with increased postural sway and delayed/incorrect balance recovery strategies with poor force regulation as well as R hemibody ataxia/impaired coordination, decreased endurance, and poor midline orientation with excessive R lateral lean. Pt will benefit from continued CIR level therapies to further progress functional mobility level with plan to D/C home with 24hr support from his wife on anticipated date of 09/27/21.   Patient continues to demonstrate the following deficits muscle weakness, decreased cardiorespiratoy endurance, impaired timing and sequencing, unbalanced muscle activation, ataxia, and decreased coordination, decreased visual acuity and decreased visual motor skills, decreased midline orientation, central origin, and decreased sitting balance, decreased standing balance,  decreased postural control, and decreased balance strategies and therefore will continue to benefit from skilled PT intervention to increase functional independence with mobility.  Patient progressing toward long term goals..  Continue plan of care.  PT Short Term Goals Week 2:  PT Short Term Goal 1 (Week 2): Pt will perform sit<>stands with min assist using LRAD consistently PT Short Term Goal 1 - Progress (Week 2): Met PT Short Term Goal 2 (Week 2): Pt will perform stand pivot transfers using LRAD with mod assist consistently PT Short Term Goal 2 - Progress (Week 2): Met PT Short Term Goal 3 (Week 2): Pt will ambulate at least 58fusing LRAD with mod assist of 1 PT Short Term Goal 3 - Progress (Week 2): Met PT Short Term Goal 4 (Week 2): Pt will initiate stair training PT Short Term Goal 4 - Progress (Week 2): Met Week 3:  PT Short Term Goal 1 (Week 3): = to LTGs based on ELOS  Skilled Therapeutic Interventions/Progress Updates:  Ambulation/gait training;Community reintegration;DME/adaptive equipment instruction;Neuromuscular re-education;Psychosocial support;Stair training;UE/LE Strength taining/ROM;Wheelchair propulsion/positioning;Balance/vestibular training;Discharge planning;Functional electrical stimulation;Pain management;Skin care/wound management;Therapeutic Activities;UE/LE Coordination activities;Cognitive remediation/compensation;Disease management/prevention;Functional mobility training;Patient/family education;Splinting/orthotics;Therapeutic Exercise;Visual/perceptual remediation/compensation  Pt received supine in bed with his wife present and NT present performing bladder scan. Pt reports he is having pain with his foley, stating "I'm useless to you at this time." Pt politely requesting not to participate in therapy at this time. Pt is agreeable for therapist to return at a later time as scheduling allows. Missed 75 minutes of skilled physical  therapy.  _______________________________________________________________________  Therapist able to return at later time to make up some missed minutes and pt received sitting in TIS w/c and agreeable to therapy session. Transported to/from gym in w/c for time management and energy conservation. L stand pivot w/c>EOM with  pt using B UE support on therapist and min assist for balance - continues to have excessive anterior lean during this.  Gait training ~138f (1x 180degree turn) using RW with heavy mod assist of 1 and +2 providing close supervision for safety - continues to have increased postural sway/ataxia, impaired R LE coordination with ataxia, wide BOS and R LOB either anterior or posterior depending on phase of gait and R LE positioning due to intermittent scissoring/excessive adduction - progressively starts to flex trunk forward requiring cuing for upright posture.  Gait training 967f(including 3x 90degree R turns and 1x 90degree L turn) using RW with mod assist of 1 for balance and AD management due to R lean/LOB and pt leaning heavily on R side of walker causing it to tip that way without assist as well as pt causing it to turn L constantly, requires +2 assist to provide close supervision for safety - continues to have above deviations, though improving.   Progressed to gait training 9712fx (3x L 90degree turns and 1x R 90 degree turn) using RW with only heavy mod A of 1 for balance and AD management due to strong R lean as just described but progressed to performing this without +2 assist nearby. LEFT turns are more challenging for pt due to requiring R LE to step more frequently with sustained L weight shift and while maintaining wider BOS.  R stand pivot back to TIS w/c using RW with light min assist for balance. Transported back to room and pt agreeable to remain sitting in w/c - left with needs in reach and hs wife present. Pt able to make up 44 minutes of the missed minutes from  earlier in day.   Therapy Documentation Precautions:  Precautions Precautions: Fall Precaution Comments: diplopia, Ataxic R; L side numbness Restrictions Weight Bearing Restrictions: No   Pain:  Pt reports pain as follows: 2/10 bladder/foley and 3/10 headache pain - premedicated - provided rest breaks for pain management.  Reports 3/10 dizziness due to the diplopia that is constant and has been present throughout stay.    Therapy/Group: Individual Therapy  CarTawana ScalePT, DPT, NCS, CSRS  09/19/2021, 7:43 AM

## 2021-09-19 NOTE — Progress Notes (Signed)
Occupational Therapy Session Note  Patient Details  Name: Maurice Little MRN: [id number] Date of Birth: 08/31/1956  Today's Date: 09/19/2021 OT Individual Time: 1101-1200 OT Individual Time Calculation (min): 59 min    Short Term Goals: Week 3:  OT Short Term Goal 1 (Week 3): Pt will complete toilet transfers with min assist sit to stand. OT Short Term Goal 2 (Week 3): Pt will maintain dynamic sitting balance at min guard assist during selfcare tasks sitting unsupported. OT Short Term Goal 3 (Week 3): Pt will complete LB dressing with min assist for two consecutive sessions.  Skilled Therapeutic Interventions/Progress Updates:    Session Note:  (1101-1200) Pt in bed to start with spouse present.  He reports increased pain in the scrotal area secondary to catheter but willing to participate.  He was able to complete supine to sit with supervision and then donned his shoes with min assist.  Increased LOB to the right when attempting to cross the RLE over the left knee and donn and tie the shoe.  Mod assist for transfer to the wheelchair stand pivot from the bed.  Took him down to the therapy gym where he transferred to the therapy mat with mod assist into quadriped.  Worked on weightbearing through the RUE with completion of small forward weightshifts in quadriped and then progressed to completion of modified pushups, both with min guard assist.  Rest breaks needed in left sidelying secondary to fatigue with transitions back to quadriped with min assist.  He was able to work on alternating lifting his UEs off of the mat with min assist needed to sustain balance when lifting the RUE secondary to right lean/LOB.  Heart rate increasing up into the mid 120s with activity.  Oxygen sats remained at 96% or greater on room air.  Also worked on transitions from tall kneeling to sitting back on his heels for a few reps with min to mod assist for balance.  Transitioned to sitting with min assist where he completed RUE  coordination task of Nine Hole Peg Test.  He was able to complete X2 in 72 and 74 seconds respectively, which was a 10 second improvement from last week.  Finished session with mod assist transfer to the wheelchair stand pivot for return to the room.  He was left with the call button and phone in reach with safety belt alarm in place.    Session Note:  (2122-4825)  Pt up in wheelchair to begin session.  He reports not significant pain to begin.  Took him down to the ortho gym for session for work on RUE strengthening and coordination.  Had him complete 4 sets on the UE ergonometer with resistance on level 11 and rpms maintain above 20.  Two sets were completed with use of BUEs for 5 mins each with the other 2 sets lasting 3 mins and 2 mins using the RUE isolated.  Next, progressed to working on standing balance with use of the BITS system to incorporate visual scanning and RUE coordination.  He was able to complete four, 1 minute intervals of Visual Scanning with use of the RUE.  He ranged from 60% accuracy to start up to 85% accuracy when using the RUE.  Therapist placed 1.5 lb wrist weight on the right wrist for one set with to help decrease ataxia with accuracy at 77%.  Reaction time stayed between 2.4 and 2.6 seconds for each set.  Mod assist was needed for standing balance secondary to posterior LOB.  Returned to the room at the end of the session with transfer to the EOB at [facility] assist level stand pivot.  He was left with nursing in the room to give medications as well as his wife being present.  Call button in reach.    Therapy Documentation Precautions:  Precautions Precautions: Fall Precaution Comments: diplopia, Ataxic R; L side numbness Restrictions Weight Bearing Restrictions: No   Pain: Pain Assessment Pain Scale: Faces Faces Pain Scale: Hurts a little bit Pain Type: Acute pain Pain Location: Penis Pain Descriptors / Indicators: Discomfort Pain Onset: With Activity Pain  Intervention(s): Repositioned ADL: See Care Tool Section for some details of mobility and selfcare   Therapy/Group: Individual Therapy  Jolyn Deshmukh OTR/L 09/19/2021, 12:07 PM

## 2021-09-20 DIAGNOSIS — I639 Cerebral infarction, unspecified: Secondary | ICD-10-CM | POA: Diagnosis not present

## 2021-09-20 MED ORDER — HYOSCYAMINE SULFATE 0.125 MG SL SUBL
0.1250 mg | SUBLINGUAL_TABLET | SUBLINGUAL | Status: DC | PRN
  Administered 2021-09-20 – 2021-09-24 (×18): 0.125 mg via ORAL
  Filled 2021-09-20 (×20): qty 1

## 2021-09-20 NOTE — Plan of Care (Signed)
  Problem: RH BLADDER ELIMINATION Goal: RH STG MANAGE BLADDER WITH ASSISTANCE Description: STG Manage Bladder With  min Assistance Outcome: Not Progressing; existing foley

## 2021-09-20 NOTE — Patient Care Conference (Addendum)
Inpatient RehabilitationTeam Conference and Plan of Care Update Date: 09/19/2021   Time: 10:45 AM    Patient Name: Maurice Little      Medical Record Number: [id number]  Date of Birth: Aug 12, 1956 Sex: Male         Room/Bed: 4M09C/4M09C-01 Payor Info: Payor: Advertising copywriter / Plan: [employer] OTHER / Product Type: *No Product type* /    Admit Date/Time:  09/04/2021  3:06 PM  Primary Diagnosis:  Cerebellar stroke [facility]  Hospital Problems: Principal Problem:   Cerebellar stroke (HCC) Active Problems:   Dyslipidemia   Slow transit constipation   Benign essential HTN    Expected Discharge Date: Expected Discharge Date: 09/27/21  Team Members Present: Physician leading conference: Dr. Claudette Laws Social Worker Present: Lavera Guise, BSW Nurse Present: Chana Bode, RN PT Present: Casimiro Needle, PT OT Present: Perrin Maltese, OT SLP Present: Elio Forget, SLP PPS Coordinator present : Fae Pippin, SLP     Current Status/Progress Goal Weekly Team Focus  Bowel/Bladder   pt cont of bowel. Has foley for retention.  pt to gain cont of bladder and remain cont of Bowel  assess q shift and PRN   Swallow/Nutrition/ Hydration             ADL's   Supervision for UB selfcare with min assist for lB bathing and mod assist for LB dressing.  Mod assist for transfers without an assistive device.  Still with RUE ataxia but improved as well as trunkial and RLE ataxia, which is still severe.  Still with dizziness at rest, increasing with movements.  Diplopia much improved however and currently only present in the far right visual field.  contact guard overall  selfcare retraining, neuromuscular re-education, balance retraining, transfer training, DME education, therapeutic exercise, vision retraining, vesitbular training, pt/family education   Mobility   supervision bed mobility using bedrails, min assist sit<>stand and mod assist stand pivot transfers without AD, progressed to  gait training up to 158ft using RW with +2 mod assist as well as navigating 4 stairs using B HRs with +2 min/mod assist - continues to have significant postural sway/ataxia, impaired R LE coordination, with impaired midline orientation causing strong R LOB requiring physical assistance to maintain upright  CGA overall at ambulatory level  transfer training, dynamic sitting and standing balance, activity tolerance, pt/family education, dynamic gait training, stair navigation training, R hemibody NMR   Communication             Safety/Cognition/ Behavioral Observations            Pain   pain 5 of 10 to penis. Tylenol effective  pain<3  assess pain q shift and PRN   Skin   Pt skin is intact.  Pt skin remain free of injury  skin care q shift     Discharge Planning:  Patient discharging home with spouse.   Team Discussion: Patient failed voiding trial and unable to complete I+O catheterizations; foley reinserted but painful. Urology consulted, foley draining properly; CT abd ordered per MD and monitoring need for additional medication. Progress limited by ataxia, poor endurance, dis-coordination and diplopia.  Patient on target to meet rehab goals: yes, slow progress noted. Patient currently needs mod assist - max assist for toileting and shower without a device due to ataxia and coordination issues. Diplopia has improved. Needs min assist for sit - to stand with a right lean. Able to ambulate up to 140' with a RW and mod assist.   *See Care Plan and  progress notes for long and short-term goals.   Revisions to Treatment Plan:  Neuropsych referral Downgraded goals for PT Stair practice with rails   Teaching Needs: Safety, medications, secondary risk management, I+O caths or foley care (wife), transfers, steps, etc.  Current Barriers to Discharge: Decreased caregiver support and Home enviroment access/layout  Possible Resolutions to Barriers: Family education w wife Bil rail installation  for entry to home vs a ramped entry DME; [facility]     Medical Summary Current Status: urinary retention , foley replaced but uncomfortable  Barriers to Discharge: Medical stability   Possible Resolutions to [employer] Focus: urology assist, additional imaging studies to eval bladder discomfort, trial antispasmodic agents   Continued Need for Acute Rehabilitation Level of Care: The patient requires daily medical management by a physician with specialized training in physical medicine and rehabilitation for the following reasons: Direction of a multidisciplinary physical rehabilitation program to maximize functional independence : Yes Medical management of patient stability for increased activity during participation in an intensive rehabilitation regime.: Yes Analysis of laboratory values and/or radiology reports with any subsequent need for medication adjustment and/or medical intervention. : Yes   I attest that I was present, lead the team conference, and concur with the assessment and plan of the team.   Chana Bode B 09/20/2021, 8:45 AM

## 2021-09-20 NOTE — Progress Notes (Signed)
PROGRESS NOTE   Subjective/Complaints: Bladder pain improved with hyoscyamine ROS:  +Left sided numbness. Denies CP, SOB, N/V/D   Objective:   CT ABDOMEN PELVIS WO CONTRAST  Result Date: 09/19/2021 CLINICAL DATA:  Hematuria, inguinal pain EXAM: CT ABDOMEN AND PELVIS WITHOUT CONTRAST TECHNIQUE: Multidetector CT imaging of the abdomen and pelvis was performed following the standard protocol without IV contrast. Unenhanced CT was performed per clinician order. Lack of IV contrast limits sensitivity and specificity, especially for evaluation of abdominal/pelvic solid viscera. COMPARISON:  None. FINDINGS: Lower chest: No acute pleural or parenchymal lung disease. Hepatobiliary: Unremarkable unenhanced appearance of the liver and gallbladder. Pancreas: Unremarkable. No pancreatic ductal dilatation or surrounding inflammatory changes. Spleen: Normal in size without focal abnormality. Adrenals/Urinary Tract: No urinary tract calculi or obstructive uropathy. The adrenals are unremarkable. The bladder is decompressed with a Foley catheter. Stomach/Bowel: No bowel obstruction or ileus. Normal appendix right lower quadrant. No bowel wall thickening or inflammatory change. Vascular/Lymphatic: Aortic atherosclerosis. No enlarged abdominal or pelvic lymph nodes. Reproductive: Mild enlargement of the prostate measuring 6 point catch that mild prostate enlargement measuring 5.7 x 4.0 cm. Other: No free fluid or free gas.  No abdominal wall hernia. Musculoskeletal: No acute or destructive bony lesions. Reconstructed images demonstrate no additional findings. IMPRESSION: 1. No evidence of urinary tract calculi or obstructive uropathy. 2. Normal appendix. 3. Mild enlargement of the prostate. 4.  Aortic Atherosclerosis (ICD10-I70.0). Electronically Signed   By: Sharlet Salina M.D.   On: 09/19/2021 19:37   No results for input(s): WBC, HGB, HCT, PLT in the last 72  hours.  No results for input(s): NA, K, CL, CO2, GLUCOSE, BUN, CREATININE, CALCIUM in the last 72 hours.   Intake/Output Summary (Last 24 hours) at 09/20/2021 0803 Last data filed at 09/20/2021 0612 Gross per 24 hour  Intake 480 ml  Output 1400 ml  Net -920 ml         Physical Exam: Vital Signs Blood pressure 130/81, pulse 87, temperature 98.6 F (37 C), resp. rate 17, height 6' (1.829 m), SpO2 95 %.  General: No acute distress Mood and affect are appropriate Heart: Regular rate and rhythm no rubs murmurs or extra sounds Lungs: Clear to auscultation, breathing unlabored, no rales or wheezes Abdomen: Positive bowel sounds, soft nontender to palpation, nondistended Extremities: No clubbing, cyanosis, or edema Skin: No evidence of breakdown, no evidence of rash Neuro: Alert Motor: 5/5 bilateral delt, bi, tri, grip, HF, KE ADF  Right facial weakness , diplopia with RIght lateral gaze  Dysarthria  Mild LUE ataxia Mild RUE dysmetria  Assessment/Plan: 1. Functional deficits which require 3+ hours per day of interdisciplinary therapy in a comprehensive inpatient rehab setting. Physiatrist is providing close team supervision and 24 hour management of active medical problems listed below. Physiatrist and rehab team continue to assess barriers to discharge/monitor patient progress toward functional and medical goals  Care Tool:  Bathing    Body parts bathed by patient: Right arm, Left arm, Chest, Abdomen, Right upper leg, Left upper leg, Right lower leg, Left lower leg, Face, Front perineal area, Buttocks   Body parts bathed by helper: Front perineal area, Buttocks  Bathing assist Assist Level: Minimal Assistance - Patient > 75%     Upper Body Dressing/Undressing Upper body dressing   What is the patient wearing?: Pull over shirt    Upper body assist Assist Level: Supervision/Verbal cueing    Lower Body Dressing/Undressing Lower body dressing      What is the  patient wearing?: Pants, Incontinence brief     Lower body assist Assist for lower body dressing: Minimal Assistance - Patient > 75%     Toileting Toileting Toileting Activity did not occur (Clothing management and hygiene only): N/A (no void or bm) (foley in place)  Toileting assist Assist for toileting: Moderate Assistance - Patient 50 - 74%     Transfers Chair/bed transfer  Transfers assist     Chair/bed transfer assist level: Moderate Assistance - Patient 50 - 74% (stand pivot)     Locomotion Ambulation   Ambulation assist   Ambulation activity did not occur: Safety/medical concerns (requires +2 and skilled assist with use of hallway rail)  Assist level: 2 helpers (+2 mod A) Assistive device: Walker-rolling Max distance: 18ft   Walk 10 feet activity   Assist  Walk 10 feet activity did not occur: Safety/medical concerns        Walk 50 feet activity   Assist Walk 50 feet with 2 turns activity did not occur: Safety/medical concerns         Walk 150 feet activity   Assist Walk 150 feet activity did not occur: Safety/medical concerns         Walk 10 feet on uneven surface  activity   Assist Walk 10 feet on uneven surfaces activity did not occur: Safety/medical concerns         Wheelchair     Assist Is the patient using a wheelchair?: Yes   Wheelchair activity did not occur: Safety/medical concerns         Wheelchair 50 feet with 2 turns activity    Assist            Wheelchair 150 feet activity     Assist          Blood pressure 130/81, pulse 87, temperature 98.6 F (37 C), resp. rate 17, height 6' (1.829 m), SpO2 95 %.    Medical Problem List and Plan: 1.  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  Antithrombotics: -DVT/anticoagulation:  Pharmaceutical: Lovenox added.              -antiplatelet therapy: ASA/Brilinta. 3. Pain Management: Tylenol prn for HA.  Occ facial shooting pains , likely CVA related as R CN 6 and 7 involved, may have CN 5 involvement as well, if this worsens may need gabapentin or topiramate 4. Mood: LCSW to follow for evaluation and support.              -antipsychotic agents: N/A 5. Neuropsych: This patient is capable of making decisions on his own behalf. 6. Skin/Wound Care: Routine pressure-relief measures. 7. Fluids/Electrolytes/Nutrition: Monitor I's/Os 8.  HTN with new hypotension/orthostaitc: Monitor BP TID.   Decreased Norvasc to 5 mg daily TEDs and ACE wraps and see how pt does.  Decreased  hydralazine to 10 mg TID   Vitals:   09/19/21 2137 09/20/21 0559  BP: (!) 124/91 130/81  Pulse: 97 87  Resp: 17 17  Temp: 99.6 F (37.6 C) 98.6 F (37 C)  SpO2: 97% 95%   Flomax increased Losartan 25 daily started on 11/20 9.  Urinary retention: may have pontine micturition center involvement +/- BPH,  Continue Flomax Cont voiding trial tolerating caths  10. Constipation: increased Senna S 2 po BID .   Miralax scheduled  Enema ordered per family request 11.  Leukocytosis: Resolved 12.  Intermittent hypokalemia: Resolved 13.  Hyperglycemia: Likely stress related as Hemoglobin A1c 5.5. 14.  Dyslipidemia: Continue Lipitor 15. 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 16. Unable to close right eye lid. Peripheral 7 Right - c/o "film over R eye" which is normal with lacrilube  -ordered eye patch right eye  -eye drops. May keep at bedside 17.  Pain with foley reinsertion, is draining clear urine, had some blood initially which is likely urethral irritation in combination with enoxaparin, check bladder scan ,discussed with urology Dr Junious Silk on call. PVR 0 ml, discussed  foley size 53fr which is appropriate, CT abd unremarkable, mild enlargement of prostate Added hyoscyamine for bladder spasms seems to be helping  LOS: 16 days A FACE TO FACE EVALUATION WAS PERFORMED  Charlett Blake 09/20/2021, 8:03 AM

## 2021-09-21 DIAGNOSIS — I639 Cerebral infarction, unspecified: Secondary | ICD-10-CM | POA: Diagnosis not present

## 2021-09-21 MED ORDER — FLAVOXATE HCL 100 MG PO TABS
100.0000 mg | ORAL_TABLET | Freq: Four times a day (QID) | ORAL | Status: DC
  Administered 2021-09-21 – 2021-09-24 (×13): 100 mg via ORAL
  Filled 2021-09-21 (×16): qty 1

## 2021-09-21 NOTE — Progress Notes (Signed)
Physical Therapy Session Note  Patient Details  Name: Maurice Little MRN: [id number] Date of Birth: February 07, 1956  Today's Date: 09/21/2021 PT Individual Time: 0800-0925 PT Individual Time Calculation (min): 85 min   Short Term Goals: Week 3:  PT Short Term Goal 1 (Week 3): = to LTGs based on ELOS  Skilled Therapeutic Interventions/Progress Updates:    Patient received recline din bed, agreeable to PT, wife at bedside. He denies pain, but reports frequent bladder spasms that "can be painful." Patient perseverative on RN providing AM rx and was easily frustrated that RN was not present with rx at 8am. Patient initially difficult to redirect due to this frustration. Wife with multiple questions about home set up info (grab bars, DME, etc). PT advising wife speak with SW re: community resources for home eval and Publishing rights manager of [employer]. Wife also with multiple questions re: fam ed and HH vs OP f/u. PT to discuss with medical team setting up fam ed prior to d/c. Patient coming to sit edge of bed with supervision and HOB elevated. MinA stand pivot to wc. PT transporting patient in wc to therapy gym for time management and energy conservation. He ambulated 12ft, 61ft, 75ft, 40ft, 62ft with RW and MinA/ModA and wc follow more for convenience. Patient remains with ataxia R >L LE, poor engagement of L lateral hip stabilizers resulting in collapse of weight back to the R and incomplete weight shift L. Seated rest breaks between bouts due to fatigue and SOB. PT educating patient on importance of adequate rest (physical and mental) to allow for safe participation in mobility as well as full recovery. Patient receptive to education, requesting that wife be educated in this as well. Blocked practice stand pivot transfers to bed height of 26" per measurement sheet, no AD, MinA/CGA. Patient with more stable weight shifting when transferring to the L vs R. Upon returning to patients room, PT and rec therapist  educating patient and wife on importance of energy conservation, safe therapeutic tasks in the home. Patient and wife receptive to education, but would benefit from more. Patient remaining up in wchair, needs within reach.  -gait tx RW MinA/ModA -blocke dpractice transfers MinA/CGA -fam ed again   Therapy Documentation Precautions:  Precautions Precautions: Fall Precaution Comments: diplopia, Ataxic R; L side numbness Restrictions Weight Bearing Restrictions: No    Therapy/Group: Individual Therapy  Elizebeth Koller, PT, DPT, CBIS  09/21/2021, 7:19 AM

## 2021-09-21 NOTE — Progress Notes (Signed)
Physical Therapy Session Note  Patient Details  Name: Maurice Little MRN: [id number] Date of Birth: 1956/06/12  Today's Date: 09/21/2021 PT Individual Time: 1125-1155 PT Individual Time Calculation (min): 30 min   Short Term Goals: Week 3:  PT Short Term Goal 1 (Week 3): = to LTGs based on ELOS  Skilled Therapeutic Interventions/Progress Updates:    Patient received for make up minutes, agreeable to in room therapy. Up in [place] with NT returning from bathroom. He denies pain and appears to be in much better spirits compared to this AM. Patient able to stand in [place] with CGA and return to bed with supervision. PT continuing education from this AM. Patient reports increased frustration with bladder spasms and next voiding trial. PT suggesting eval with pelvic floor therapists upon dc (and completion of HH transitioning to OP) to address pelvic floor muscle imbalances and to assist with bladder re-training. Patient open to the idea. Patient remaining in bed, bed alarm on, call light within reach.   Therapy Documentation Precautions:  Precautions Precautions: Fall Precaution Comments: diplopia, Ataxic R; L side numbness Restrictions Weight Bearing Restrictions: No General:   Vital Signs:   Pain:   Mobility:   Locomotion :    Trunk/Postural Assessment :    Balance:   Exercises:   Other Treatments:      Therapy/Group: Individual Therapy  Westley Foots 09/21/2021, 11:39 AM

## 2021-09-21 NOTE — Progress Notes (Signed)
Physical Therapy Session Note  Patient Details  Name: Maurice Little MRN: [id number] Date of Birth: [birth year]  Today's Date: 09/21/2021 PT Individual Time: 1530-1608 PT Individual Time Calculation (min): 38 min   Short Term Goals:  Week 3:  PT Short Term Goal 1 (Week 3): = to LTGs based on ELOS  Skilled Therapeutic Interventions/Progress Updates:   Pt received supine in bed and agreeable to PT. Supine>sit transfer with supervision assist and cues for use of bed rails as needed for safety once sitting EOB. Stand pivot transfer without AD and mod assist due to mild posterior bias. Pt transported to day room in [facility]. Gait training with RW x 54f with min fading to mod assist with fatigue and mild LOB to the R. Additional gait training x 280fwith min assist and RW into room with cues for decreased step length on the RLE intermittently. Dynamic balance training while engaged in fine motor task of pip tree puzzle. Pt able to complete low and hight difficulty with min-CGA from PT for balance with intermittent  1-0 UE support on high low table. Pt returned to room and performed ambulatory transfer to bed with RW as listed above. Sit>supine completed with supervision assist with cues for safety and foley management. Pt left supine in bed with call bell in reach and all needs met.         Therapy Documentation Precautions:  Precautions Precautions: Fall Precaution Comments: Ataxic R; L side numbness Restrictions Weight Bearing Restrictions: No    Vital Signs: Therapy Vitals Temp: 98.8 F (37.1 C) Temp Source: Oral Pulse Rate: 90 Resp: 18 BP: 121/83 Patient Position (if appropriate): Lying Oxygen Therapy SpO2: 99 % O2 Device: Room Air Pain: Pain Assessment Pain Scale: 0-10 Pain Score: 3  Pain Location: Head Pain Orientation: Medial Pain Descriptors / Indicators: Discomfort Pain Onset: On-going Patients Stated Pain Goal: 0 Pain Intervention(s): Medication (See  eMAR)     Therapy/Group: Individual Therapy  AuLorie Phenix1/25/2022, 4:13 PM

## 2021-09-21 NOTE — Progress Notes (Signed)
Occupational Therapy Session Note  Patient Details  Name: Maurice Little MRN: [id number] Date of Birth: [birth year]  Today's Date: 09/21/2021 OT Individual Time: 5638-7564 OT Individual Time Calculation (min): 74 min    Short Term Goals: Week 3:  OT Short Term Goal 1 (Week 3): Pt will complete toilet transfers with min assist sit to stand. OT Short Term Goal 2 (Week 3): Pt will maintain dynamic sitting balance at min guard assist during selfcare tasks sitting unsupported. OT Short Term Goal 3 (Week 3): Pt will complete LB dressing with min assist for two consecutive sessions.  Skilled Therapeutic Interventions/Progress Updates:    Pt completed bathing and dressing during session.  Supervision for supine to sit EOB with max assist for functional mobility to the shower bench.  Once at the seat, he was able to undress at min assist level sit to stand.  He was able to complete all bathing with min guard assist in sitting with lateral leans.  He was then able to transfer out to the EOB at max assist.  Supervision for donning pullover shirt EOB with mod assist for standing balance to donn LB clothing.  He was able to donn his socks and shoes with min guard assist crossing one LE over the opposite knee.  Finished session with pt resting in bed with spouse present.  Call button and phone in reach.    Therapy Documentation Precautions:  Precautions Precautions: Fall Precaution Comments: Ataxic R; L side numbness Restrictions Weight Bearing Restrictions: No    Pain: Pain Assessment Pain Scale: 0-10 Pain Score: 3  Pain Location: Head Pain Orientation: Medial Pain Descriptors / Indicators: Discomfort Pain Onset: On-going Patients Stated Pain Goal: 0 Pain Intervention(s): Medication (See eMAR) ADL: See Care Tool Section for some details of mobility and selfcare  Therapy/Group: Individual Therapy  Johnaton Sonneborn OTR/L 09/21/2021, 3:38 PM

## 2021-09-21 NOTE — Progress Notes (Signed)
PROGRESS NOTE   Subjective/Complaints: C/o bladder spasms in middle of the night is willing to be awakened to take medication  ROS:  +Left sided numbness. Denies CP, SOB, N/V/D   Objective:   CT ABDOMEN PELVIS WO CONTRAST  Result Date: 09/19/2021 CLINICAL DATA:  Hematuria, inguinal pain EXAM: CT ABDOMEN AND PELVIS WITHOUT CONTRAST TECHNIQUE: Multidetector CT imaging of the abdomen and pelvis was performed following the standard protocol without IV contrast. Unenhanced CT was performed per clinician order. Lack of IV contrast limits sensitivity and specificity, especially for evaluation of abdominal/pelvic solid viscera. COMPARISON:  None. FINDINGS: Lower chest: No acute pleural or parenchymal lung disease. Hepatobiliary: Unremarkable unenhanced appearance of the liver and gallbladder. Pancreas: Unremarkable. No pancreatic ductal dilatation or surrounding inflammatory changes. Spleen: Normal in size without focal abnormality. Adrenals/Urinary Tract: No urinary tract calculi or obstructive uropathy. The adrenals are unremarkable. The bladder is decompressed with a Foley catheter. Stomach/Bowel: No bowel obstruction or ileus. Normal appendix right lower quadrant. No bowel wall thickening or inflammatory change. Vascular/Lymphatic: Aortic atherosclerosis. No enlarged abdominal or pelvic lymph nodes. Reproductive: Mild enlargement of the prostate measuring 6 point catch that mild prostate enlargement measuring 5.7 x 4.0 cm. Other: No free fluid or free gas.  No abdominal wall hernia. Musculoskeletal: No acute or destructive bony lesions. Reconstructed images demonstrate no additional findings. IMPRESSION: 1. No evidence of urinary tract calculi or obstructive uropathy. 2. Normal appendix. 3. Mild enlargement of the prostate. 4.  Aortic Atherosclerosis (ICD10-I70.0). Electronically Signed   By: Randa Ngo M.D.   On: 09/19/2021 19:37   No results  for input(s): WBC, HGB, HCT, PLT in the last 72 hours.  No results for input(s): NA, K, CL, CO2, GLUCOSE, BUN, CREATININE, CALCIUM in the last 72 hours.   Intake/Output Summary (Last 24 hours) at 09/21/2021 0657 Last data filed at 09/21/2021 0506 Gross per 24 hour  Intake 480 ml  Output 2600 ml  Net -2120 ml         Physical Exam: Vital Signs Blood pressure 134/85, pulse 94, temperature 98.7 F (37.1 C), temperature source Oral, resp. rate 16, height 6' (1.829 m), SpO2 96 %.   General: No acute distress Mood and affect are appropriate Heart: Regular rate and rhythm no rubs murmurs or extra sounds Lungs: Clear to auscultation, breathing unlabored, no rales or wheezes Abdomen: Positive bowel sounds, soft nontender to palpation, nondistended Extremities: No clubbing, cyanosis, or edema Skin: No evidence of breakdown, no evidence of rash   Neuro: Alert Motor: 5/5 bilateral delt, bi, tri, grip, HF, KE ADF  Right facial weakness , diplopia with RIght lateral gaze  Dysarthria  Mild LUE ataxia Mild RUE dysmetria  Assessment/Plan: 1. Functional deficits which require 3+ hours per day of interdisciplinary therapy in a comprehensive inpatient rehab setting. Physiatrist is providing close team supervision and 24 hour management of active medical problems listed below. Physiatrist and rehab team continue to assess barriers to discharge/monitor patient progress toward functional and medical goals  Care Tool:  Bathing    Body parts bathed by patient: Right arm, Left arm, Chest, Abdomen, Right upper leg, Left upper leg, Right lower leg, Left lower leg, Face, Front  perineal area, Buttocks   Body parts bathed by helper: Front perineal area, Buttocks     Bathing assist Assist Level: Minimal Assistance - Patient > 75%     Upper Body Dressing/Undressing Upper body dressing   What is the patient wearing?: Pull over shirt    Upper body assist Assist Level: Supervision/Verbal  cueing    Lower Body Dressing/Undressing Lower body dressing      What is the patient wearing?: Pants, Incontinence brief     Lower body assist Assist for lower body dressing: Minimal Assistance - Patient > 75%     Toileting Toileting Toileting Activity did not occur (Clothing management and hygiene only): N/A (no void or bm) (foley in place)  Toileting assist Assist for toileting: Moderate Assistance - Patient 50 - 74%     Transfers Chair/bed transfer  Transfers assist     Chair/bed transfer assist level: Moderate Assistance - Patient 50 - 74% (stand pivot)     Locomotion Ambulation   Ambulation assist   Ambulation activity did not occur: Safety/medical concerns (requires +2 and skilled assist with use of hallway rail)  Assist level: 2 helpers (+2 mod A) Assistive device: Walker-rolling Max distance: 175ft   Walk 10 feet activity   Assist  Walk 10 feet activity did not occur: Safety/medical concerns        Walk 50 feet activity   Assist Walk 50 feet with 2 turns activity did not occur: Safety/medical concerns         Walk 150 feet activity   Assist Walk 150 feet activity did not occur: Safety/medical concerns         Walk 10 feet on uneven surface  activity   Assist Walk 10 feet on uneven surfaces activity did not occur: Safety/medical concerns         Wheelchair     Assist Is the patient using a wheelchair?: Yes   Wheelchair activity did not occur: Safety/medical concerns         Wheelchair 50 feet with 2 turns activity    Assist            Wheelchair 150 feet activity     Assist          Blood pressure 134/85, pulse 94, temperature 98.7 F (37.1 C), temperature source Oral, resp. rate 16, height 6' (1.829 m), SpO2 96 %.    Medical Problem List and Plan: 1.  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  Antithrombotics: -DVT/anticoagulation:  Pharmaceutical: Lovenox added.              -antiplatelet therapy: ASA/Brilinta. 3. Pain Management: Tylenol prn for HA.  Occ facial shooting pains , likely CVA related as R CN 6 and 7 involved, may have CN 5 involvement as well, if this worsens may need gabapentin or topiramate 4. Mood: LCSW to follow for evaluation and support.              -antipsychotic agents: N/A 5. Neuropsych: This patient is capable of making decisions on his own behalf. 6. Skin/Wound Care: Routine pressure-relief measures. 7. Fluids/Electrolytes/Nutrition: Monitor I's/Os 8.  HTN with  new hypotension/orthostaitc: Monitor BP TID.   Decreased Norvasc to 5 mg daily TEDs and ACE wraps and see how pt does.  Decreased hydralazine to 10 mg TID   Vitals:   09/20/21 1949 09/21/21 0452  BP: 121/89 134/85  Pulse: 91 94  Resp: 18 16  Temp: 98.9 F (37.2 C) 98.7 F (37.1 C)  SpO2: 97% 96%   Flomax increased Losartan 25 daily started on 11/20 9.  Urinary retention: may have pontine micturition center involvement +/- BPH,  Continue Flomax Cont voiding trial tolerating caths  10. Constipation: increased Senna S 2 po BID .   Miralax scheduled  Enema ordered per family request 11.  Leukocytosis: Resolved 12.  Intermittent hypokalemia: Resolved 13.  Hyperglycemia: Likely stress related as Hemoglobin A1c 5.5. 14.  Dyslipidemia: Continue Lipitor 15. 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 16. Unable to close right eye lid. Peripheral 7 Right - c/o "film over R eye" which is normal with lacrilube  -ordered eye patch right eye  -eye drops. May keep at bedside 17.  Pain with foley reinsertion, is draining clear urine, had some blood initially which is likely urethral  irritation in combination with enoxaparin, check bladder scan ,discussed with urology Dr Junious Silk on call. PVR 0 ml, discussed foley size 93fr which is appropriate, CT abd unremarkable, mild enlargement of prostate Added scheduled Urispas TID and  hyoscyamine prn for bladder spasms seems to be helping  Pt would like night time dose of Urispas will change to Q 6h If no improvement with bladder spasms would change to coude cath on Monday  LOS: 17 days A FACE TO FACE EVALUATION WAS PERFORMED  Charlett Blake 09/21/2021, 6:57 AM

## 2021-09-22 DIAGNOSIS — I639 Cerebral infarction, unspecified: Secondary | ICD-10-CM | POA: Diagnosis not present

## 2021-09-22 NOTE — Progress Notes (Signed)
PROGRESS NOTE   Subjective/Complaints: Grounds pass ordered No more bladder spasms today Wife at bedside Tolerated therapy well today  ROS:  +Left sided numbness. Denies CP, SOB, N/V/D, no more bladder spasms   Objective:   No results found. No results for input(s): WBC, HGB, HCT, PLT in the last 72 hours.  No results for input(s): NA, K, CL, CO2, GLUCOSE, BUN, CREATININE, CALCIUM in the last 72 hours.   Intake/Output Summary (Last 24 hours) at 09/22/2021 1853 Last data filed at 09/22/2021 1830 Gross per 24 hour  Intake 2502 ml  Output 5550 ml  Net -3048 ml        Physical Exam: Vital Signs Blood pressure 132/87, pulse 92, temperature 97.8 F (36.6 C), temperature source Oral, resp. rate 16, height 6' (1.829 m), SpO2 99 %.  Gen: no distress, normal appearing HEENT: oral mucosa pink and moist, NCAT Cardio: Reg rate Chest: normal effort, normal rate of breathing Abd: soft, non-distended Ext: no edema Psych: pleasant, normal affect Skin: intact  Neuro: Alert Motor: 5/5 bilateral delt, bi, tri, grip, HF, KE ADF  Right facial weakness , diplopia with RIght lateral gaze  Dysarthria  Mild LUE ataxia Mild RUE dysmetria  Assessment/Plan: 1. Functional deficits which require 3+ hours per day of interdisciplinary therapy in a comprehensive inpatient rehab setting. Physiatrist is providing close team supervision and 24 hour management of active medical problems listed below. Physiatrist and rehab team continue to assess barriers to discharge/monitor patient progress toward functional and medical goals  Care Tool:  Bathing    Body parts bathed by patient: Right arm, Left arm, Chest, Abdomen, Right upper leg, Left upper leg, Right lower leg, Left lower leg, Face, Front perineal area, Buttocks   Body parts bathed by helper: Front perineal area, Buttocks     Bathing assist Assist Level: Minimal Assistance -  Patient > 75%     Upper Body Dressing/Undressing Upper body dressing   What is the patient wearing?: Pull over shirt    Upper body assist Assist Level: Supervision/Verbal cueing    Lower Body Dressing/Undressing Lower body dressing      What is the patient wearing?: Pants, Incontinence brief     Lower body assist Assist for lower body dressing: Minimal Assistance - Patient > 75%     Toileting Toileting Toileting Activity did not occur (Clothing management and hygiene only): N/A (no void or bm) (foley in place)  Toileting assist Assist for toileting: Moderate Assistance - Patient 50 - 74%     Transfers Chair/bed transfer  Transfers assist     Chair/bed transfer assist level: Moderate Assistance - Patient 50 - 74% (stand pivot)     Locomotion Ambulation   Ambulation assist   Ambulation activity did not occur: Safety/medical concerns (requires +2 and skilled assist with use of hallway rail)  Assist level: 2 helpers (+2 mod A) Assistive device: Walker-rolling Max distance: 151ft   Walk 10 feet activity   Assist  Walk 10 feet activity did not occur: Safety/medical concerns        Walk 50 feet activity   Assist Walk 50 feet with 2 turns activity did not occur: Safety/medical concerns  Walk 150 feet activity   Assist Walk 150 feet activity did not occur: Safety/medical concerns         Walk 10 feet on uneven surface  activity   Assist Walk 10 feet on uneven surfaces activity did not occur: Safety/medical concerns         Wheelchair     Assist Is the patient using a wheelchair?: Yes   Wheelchair activity did not occur: Safety/medical concerns         Wheelchair 50 feet with 2 turns activity    Assist            Wheelchair 150 feet activity     Assist          Blood pressure 132/87, pulse 92, temperature 97.8 F (36.6 C), temperature source Oral, resp. rate 16, height 6' (1.829 m), SpO2 99 %.     Medical Problem List and Plan: 1.  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  Impaired mobility: continue Lovenox added.              -antiplatelet therapy: ASA/Brilinta. 3. Pain Management: Tylenol prn for HA.  Occ facial shooting pains , likely CVA related as R CN 6 and 7 involved, may have CN 5 involvement as well, if this worsens may need gabapentin or topiramate 4. Mood: LCSW to follow for evaluation and support.              -antipsychotic agents: N/A 5. Neuropsych: This patient is capable of making decisions on his own behalf. 6. Skin/Wound Care: Routine pressure-relief measures. 7. Fluids/Electrolytes/Nutrition: Monitor I's/Os 8.  HTN with new hypotension/orthostaitc: Monitor BP TID.   Decreased Norvasc to 5 mg daily TEDs and ACE wraps and see how pt does.  Decreased hydralazine to 10 mg TID   Vitals:   09/22/21 0438 09/22/21 1333  BP: 129/84 132/87  Pulse: 85 92  Resp: 14 16  Temp: 98.8 F (37.1 C) 97.8 F (36.6 C)  SpO2: 97% 99%   Flomax increased Losartan 25 daily started on 11/20, continue 9.  Urinary retention: may have pontine micturition center involvement +/- BPH,  Continue Flomax, can consider increasing to 0.8mg  if still having difficulty after foley removal on Monday Cont voiding trial tolerating caths  10. Constipation: increased Senna S 2 po BID .   Miralax scheduled  Enema ordered per family request 11.  Leukocytosis: Resolved 12.  Intermittent hypokalemia: Resolved 13.  Hyperglycemia: Likely stress related as Hemoglobin A1c 5.5. 14.  Dyslipidemia: Continue Lipitor 15. 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 16. Unable to close right eye  lid. Peripheral 7 Right - c/o "film over R eye" which is normal with lacrilube  -ordered eye patch right eye  -eye drops. May keep at bedside 17.  Pain with foley reinsertion, is draining clear urine, had some blood initially which is likely urethral irritation in combination with enoxaparin, check bladder scan ,discussed with urology Dr Mena Goes on call. PVR 0 ml, discussed foley size 20fr which is appropriate, CT abd unremarkable, mild enlargement of prostate Added scheduled Urispas TID and  hyoscyamine prn for bladder spasms seems to  be helping  Pt would like night time dose of Urispas will change to Q 6h If no improvement with bladder spasms would change to coude cath on Monday  LOS: 18 days A FACE TO FACE EVALUATION WAS PERFORMED  Maurice Little 09/22/2021, 6:53 PM

## 2021-09-23 NOTE — Progress Notes (Signed)
Physical Therapy Session Note  Patient Details  Name: Maurice Little MRN: [id number] Date of Birth: December 31, 1955  Today's Date: 09/23/2021 PT Individual Time: 0905-1000 PT Individual Time Calculation (min): 55 min   Short Term Goals: Week 3:  PT Short Term Goal 1 (Week 3): = to LTGs based on ELOS  Skilled Therapeutic Interventions/Progress Updates:    Pt received supine in bed, agreeable to unscheduled therapy session as he did not have therapy scheduled at all over the weekend. No complaints of pain. Bed mobility Supervision with use of bedrail. Pt is setup A to don shoes while in bed. Squat pivot transfer to w/c with CGA. Sit to stand with CGA to min A to RW during session. Ambulation 4 x 70 ft with RW and mod A for balance, R lateral lean and decreased R heel strike during gait. Cues for decreased step length with RLE due to increase in lateral lean with large step with RLE. Pt exhibits improved overall balance with decreased RLE step length. Pt also exhibits occasional LOB posteriorly, cues for use of ankle strategy. Ascend/descend 4 x 6" stairs with 2 handrails and mod A for balance, step-through gait pattern ascending and step-to descending, x 2 reps. Pt continues to exhibit R lateral lean during stair navigation. Demonstrated lateral technique with use of one handrail as pt cannot reach both handrails at home, will work towards trialing this method. Pt requests to return to bed at end of session due to fatigue. Squat pivot transfer back to bed with CGA. Sit to supine Supervision with use of bedrail. Pt left seated in bed with needs in reach in care of his wife. Pt very appreciative of ability to receive therapy services this date.  Therapy Documentation Precautions:  Precautions Precautions: Fall Precaution Comments: Ataxic R; L side numbness Restrictions Weight Bearing Restrictions: No    Therapy/Group: Individual Therapy   Peter Congo, PT, DPT, CSRS  09/23/2021, 12:07 PM

## 2021-09-23 NOTE — Progress Notes (Signed)
Occupational Therapy Session Note  Patient Details  Name: Maurice Little MRN: [id number] Date of Birth: 1956/04/06  Today's Date: 09/23/2021 OT Individual Time: 0932-6712 OT Individual Time Calculation (min): 43 min    Skilled Therapeutic Interventions/Progress Updates:    Pt greeted in bed and agreeable to extra therapy time. Agreeable to shower today. Used the [place] with supervision for sit<stand, cues to keep knees off of the [place] block to work on balance and LE strength during transfers. Post transfer, pt bathed at seated level in the shower, able to utilize figure 4 position to wash feet given CGA for balance. Lateral leans for pericare with the same balance assistance. He tends to lose balance towards lateral Rt. Note that he did very well with using his Rt hand to use the hand held shower and wash Lt side of body! We celebrated! Dressing was completed EOB sit<stand using RW. OT assisted with his foley catheter but pt able to complete 3/3 components of donning pants given Mod balance assistance in standing due to Rt lean and decreased awareness. Discussed with pt looking at his feet to see when he is losing his balance to cue him to fix it. With figure 4 position pt able to don both gripper socks with CGA. Wife present throughout session and very receptive to education, actively d/c planning with therapist (planning to have grab bars manually installed in the shower). Pt returned to bed at close of session, all needs within reach at time of departure.   Therapy Documentation Precautions:  Precautions Precautions: Fall Precaution Comments: Ataxic R; L side numbness Restrictions Weight Bearing Restrictions: No Pain: no c/o pain during tx   ADL: ADL Eating: Maximal assistance Where Assessed-Eating: Bed level Grooming: Maximal assistance Where Assessed-Grooming: Bed level Upper Body Bathing: Minimal assistance Where Assessed-Upper Body Bathing: Edge of bed Lower Body Bathing: Maximal  assistance Where Assessed-Lower Body Bathing: Edge of bed Upper Body Dressing: Minimal assistance Where Assessed-Upper Body Dressing: Edge of bed Lower Body Dressing: Maximal assistance Where Assessed-Lower Body Dressing: Edge of bed Toileting: Dependent Where Assessed-Toileting: Bedside Commode Toilet Transfer: Maximal assistance Toilet Transfer Method: Stand pivot Acupuncturist: Animator Transfer: Not assessed Film/video editor: Not assessed  Therapy/Group: Individual Therapy  Teegan Guinther A Tanique Matney 09/23/2021, 12:54 PM

## 2021-09-24 DIAGNOSIS — I639 Cerebral infarction, unspecified: Secondary | ICD-10-CM | POA: Diagnosis not present

## 2021-09-24 LAB — BASIC METABOLIC PANEL
Anion gap: 7 (ref 5–15)
BUN: 15 mg/dL (ref 8–23)
CO2: 27 mmol/L (ref 22–32)
Calcium: 9 mg/dL (ref 8.9–10.3)
Chloride: 101 mmol/L (ref 98–111)
Creatinine, Ser: 1.09 mg/dL (ref 0.61–1.24)
GFR, Estimated: >60 mL/min (ref >60)
Glucose, Bld: 97 mg/dL (ref 70–99)
Potassium: 4.5 mmol/L (ref 3.5–5.1)
Sodium: 135 mmol/L (ref 135–145)

## 2021-09-24 LAB — CBC
HCT: 32.5 % — ABNORMAL LOW (ref 39.0–52.0)
Hemoglobin: 10.9 g/dL — ABNORMAL LOW (ref 13.0–17.0)
MCH: 31.6 pg (ref 26.0–34.0)
MCHC: 33.5 g/dL (ref 30.0–36.0)
MCV: 94.2 fL (ref 80.0–100.0)
Platelets: 311 10*3/uL (ref 150–400)
RBC: 3.45 MIL/uL — ABNORMAL LOW (ref 4.22–5.81)
RDW: 13.2 % (ref 11.5–15.5)
WBC: 7.4 10*3/uL (ref 4.0–10.5)
nRBC: 0 % (ref 0.0–0.2)

## 2021-09-24 NOTE — Progress Notes (Signed)
Patient ID: Maurice Little, male   DOB: 08-Nov-1955, 65 y.o.   MRN: [id number]  OP orders sent to [facility]. SW waiting on DME reccs  Lavera Guise, [place] [phone]

## 2021-09-24 NOTE — Progress Notes (Signed)
PROGRESS NOTE   Subjective/Complaints:  Notes dark urine end of day wife has noted sediment or small clots, no nursing report of hematuria , urine clear yello w this am , no bladder spasm but is on around the clock short and longer acting antispasmodics   ROS:  +Left sided numbness. Denies CP, SOB, N/V/D, no more bladder spasms   Objective:   No results found. Recent Labs    09/24/21 0542  WBC 7.4  HGB 10.9*  HCT 32.5*  PLT 311    Recent Labs    09/24/21 0542  NA 135  K 4.5  CL 101  CO2 27  GLUCOSE 97  BUN 15  CREATININE 1.09  CALCIUM 9.0     Intake/Output Summary (Last 24 hours) at 09/24/2021 0857 Last data filed at 09/24/2021 0745 Gross per 24 hour  Intake 1192 ml  Output 1025 ml  Net 167 ml         Physical Exam: Vital Signs Blood pressure 125/81, pulse 81, temperature 98.9 F (37.2 C), temperature source Oral, resp. rate 18, height 6' (1.829 m), SpO2 97 %.   General: No acute distress Mood and affect are appropriate Heart: Regular rate and rhythm no rubs murmurs or extra sounds Lungs: Clear to auscultation, breathing unlabored, no rales or wheezes Abdomen: Positive bowel sounds, soft nontender to palpation, nondistended Extremities: No clubbing, cyanosis, or edema Skin: No evidence of breakdown, no evidence of rash    Neuro: Alert Motor: 5/5 bilateral delt, bi, tri, grip, HF, KE ADF  Right facial weakness , diplopia with RIght lateral gaze  Dysarthria  Mild LUE ataxia Mild RUE dysmetria  Assessment/Plan: 1. Functional deficits which require 3+ hours per day of interdisciplinary therapy in a comprehensive inpatient rehab setting. Physiatrist is providing close team supervision and 24 hour management of active medical problems listed below. Physiatrist and rehab team continue to assess barriers to discharge/monitor patient progress toward functional and medical goals  Care  Tool:  Bathing    Body parts bathed by patient: Right arm, Left arm, Chest, Abdomen, Right upper leg, Left upper leg, Right lower leg, Left lower leg, Face, Front perineal area, Buttocks   Body parts bathed by helper: Front perineal area, Buttocks     Bathing assist Assist Level: Contact Guard/Touching assist     Upper Body Dressing/Undressing Upper body dressing   What is the patient wearing?: Pull over shirt    Upper body assist Assist Level: Set up assist    Lower Body Dressing/Undressing Lower body dressing      What is the patient wearing?: Pants     Lower body assist Assist for lower body dressing: Minimal Assistance - Patient > 75%     Toileting Toileting Toileting Activity did not occur (Clothing management and hygiene only): N/A (no void or bm) (foley in place)  Toileting assist Assist for toileting: Moderate Assistance - Patient 50 - 74%     Transfers Chair/bed transfer  Transfers assist     Chair/bed transfer assist level: Moderate Assistance - Patient 50 - 74% (stand pivot)     Locomotion Ambulation   Ambulation assist   Ambulation activity did not occur: Safety/medical concerns (  requires +2 and skilled assist with use of hallway rail)  Assist level: 2 helpers (+2 mod A) Assistive device: Walker-rolling Max distance: 152ft   Walk 10 feet activity   Assist  Walk 10 feet activity did not occur: Safety/medical concerns        Walk 50 feet activity   Assist Walk 50 feet with 2 turns activity did not occur: Safety/medical concerns         Walk 150 feet activity   Assist Walk 150 feet activity did not occur: Safety/medical concerns         Walk 10 feet on uneven surface  activity   Assist Walk 10 feet on uneven surfaces activity did not occur: Safety/medical concerns         Wheelchair     Assist Is the patient using a wheelchair?: Yes   Wheelchair activity did not occur: Safety/medical concerns          Wheelchair 50 feet with 2 turns activity    Assist            Wheelchair 150 feet activity     Assist          Blood pressure 125/81, pulse 81, temperature 98.9 F (37.2 C), temperature source Oral, resp. rate 18, height 6' (1.829 m), SpO2 97 %.    Medical Problem List and Plan: 1.  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  Impaired mobility: continue Lovenox added. may need to d/c if hematuria worsens              -antiplatelet therapy: ASA/Brilinta. 3. Pain Management: Tylenol prn for HA.  Occ facial shooting pains , likely CVA related as R CN 6 and 7 involved, may have CN 5 involvement as well, if this worsens may need gabapentin or topiramate 4. Mood: LCSW to follow for evaluation and support.              -antipsychotic agents: N/A 5. Neuropsych: This patient is capable of making decisions on his own behalf. 6. Skin/Wound Care: Routine pressure-relief measures. 7. Fluids/Electrolytes/Nutrition: Monitor I's/Os 8.  HTN with new hypotension/orthostaitc: Monitor BP TID.   Decreased Norvasc to 5 mg daily TEDs and ACE wraps and see how pt does.  Decreased hydralazine to 10 mg TID   Vitals:   09/23/21 1921 09/24/21 0446  BP: 125/75 125/81  Pulse: 81 81  Resp: 17 18  Temp: 98.6 F (37 C) 98.9 F (37.2 C)  SpO2: 99% 97%   Flomax increased Losartan 25 daily started on 11/20, continue 9.  Urinary retention: may have pontine micturition center involvement +/- BPH,  Continue Flomax, can consider increasing to 0.8mg  if still having difficulty after foley removal on Monday Cont voiding trial tolerating caths  10. Constipation: increased Senna S 2 po BID .    Miralax scheduled  Enema ordered per family request 11.  Leukocytosis: Resolved 12.  Intermittent hypokalemia: Resolved 13.  Hyperglycemia: Likely stress related as Hemoglobin A1c 5.5. 14.  Dyslipidemia: Continue Lipitor 15. 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 16. Unable to close right eye lid. Peripheral 7 Right - c/o "film  over R eye" which is normal with lacrilube  -ordered eye patch right eye  -eye drops. May keep at bedside 17.  Pain with foley reinsertion, is draining clear urine, had some blood initially which is likely urethral irritation in combination with enoxaparin, check bladder scan ,discussed with urology Dr Mena Goes on call. PVR 0 ml, discussed foley size 45fr which is appropriate, CT abd unremarkable, mild enlargement of prostate Added scheduled Urispas TID and  hyoscyamine prn for bladder spasms seems to be helping  Pt would like night time dose of Urispas will change to Q 6h  Discussed retention with pt and wife, pt feels like bowel function is normalizing an dis hoping bladder does as well, will do another voiding trial ~48h off antispasmodic meds  Monitor for hematuia  LOS: 20 days A FACE TO FACE EVALUATION WAS PERFORMED  Erick Colace 09/24/2021, 8:57 AM

## 2021-09-24 NOTE — Progress Notes (Signed)
Occupational Therapy Session Note  Patient Details  Name: Maurice Little MRN: [id number] Date of Birth: [birth year]  Today's Date: 09/24/2021 OT Individual Time: 1300-1359 OT Individual Time Calculation (min): 59 min    Short Term Goals: Week 3:  OT Short Term Goal 1 (Week 3): Pt will complete toilet transfers with min assist sit to stand. OT Short Term Goal 2 (Week 3): Pt will maintain dynamic sitting balance at min guard assist during selfcare tasks sitting unsupported. OT Short Term Goal 3 (Week 3): Pt will complete LB dressing with min assist for two consecutive sessions.  Skilled Therapeutic Interventions/Progress Updates:  Patient met lying supine in bed in agreement with OT treatment session. 0/10 pain reported at rest and with activity. Wife present throughout session. Supine to EOB transfer with supervision A. Patient able to don footwear seated EOB with Min guard and 1 R lateral LOB. Short-distance functional mobility to commode in bathroom with use of RW and external assist for balance and orientation to midline. Patient with continued posterior LOB. Patient able to sit<>stand from standard commode with use of grab bar and Min A. Able to complete hygiene management with lateral leans. Assist required to hike pants over hips in standing. Hand hygiene standing at sink level with Mod A. Total A for wc transport to and from ADL apartment for time management. Focus on walk-in shower transfers simulating home environment. Wife reports having purchased a tub transfer bench for use in walk-in shower and plan to install grab bars. After education and visual demo, wife assisted patient with walk-in shower transfer and functional mobility several feet simulating home environment. Session concluded with patient seated in wc with call bell within reach and all needs met.   Therapy Documentation Precautions:  Precautions Precautions: Fall Precaution Comments: Ataxic R; L side  numbness Restrictions Weight Bearing Restrictions: No General:   Therapy/Group: Individual Therapy  Mickel Schreur R Howerton-Davis 09/24/2021, 12:55 PM

## 2021-09-24 NOTE — Progress Notes (Signed)
Patient ID: Maurice Little, male   DOB: 1955/12/06, 65 y.o.   MRN: [id number]  Wheelchair ordered through [employer], [place] [phone]

## 2021-09-24 NOTE — Progress Notes (Signed)
Physical Therapy Session Note  Patient Details  Name: Maurice Little MRN: [id number] Date of Birth: 10-Mar-1956  Today's Date: 09/24/2021 PT Individual Time: 1002-1058; 1400-1510 PT Individual Time Calculation (min): 56 min and 70 mins  Short Term Goals: Week 3:  PT Short Term Goal 1 (Week 3): = to LTGs based on ELOS  Skilled Therapeutic Interventions/Progress Updates:    Session 1: Patient received sitting up in bed, agreeable to PT. He denies pain. Wife at bedside with multiple questions regarding order equipment, installing grab bars, etc. PT deferring to SW regarding ordering specific equipment. However, PT did educate patient and wife on custom wc vs standard wc. Patient may progress to the point where he is safe to ambulate longer distances and so may only need a standard wc at this time. Wife and patient agreeable. Patient transferring to standard wc via stand pivot with MinA and prevalent R lateral lean. PT transporting patient in wc to therapy gym for time management and energy conservation. He ambulated 68ft with RW and MinA/ModA, then 101ft with RW MinA/ModA. The following gait deviations noted: WBOS, R posterior weight shift, ataxia R >L, increased reliance on B Ues. Patient able to propel the standard wc back to his room with B LE for improved coordination. Significant increase in difficulty when using B UE, but relatively efficient when only using B LE. Patient returning to bed via stand pivot with MinA. Bed alarm on, call light within reach, wife at bedside.    Session 2: Patient received sitting up in wc, agreeable to PT. He denies pain. Wife at bedside and agreeable to unscheduled PT education. PT educating patient and wife on the following: safe set up for transfers, guarding R side, transferring L when able, use of gait belt, safe body mechanics for both wife and patient. PT demonstrating stand pivot transfer to/from wc x1 and then wife performing transfer x2 with close supervision by PT.  Patient ambulating x87ft with RW and MinA/ModA from PT and then 3x28ft with wife providing assist and CGA provided by PT for safety. Wife demonstrating safety and competency with short household ambulation. Patient negotiating 2x4 steps with B UE on 1 handrail ascending/descending sideways leading with R LE. PT providing MinA initially and then wife providing assist with PT providing close supervision and cuing to wife for safe body mechanics when guarding patient on steps. They will benefit from further family education prior to dc. Patient returning to room in wc, wife assisting patient back to bed with PT supervision. Bed alarm on, call light within reach.     Therapy Documentation Precautions:  Precautions Precautions: Fall Precaution Comments: Ataxic R; L side numbness Restrictions Weight Bearing Restrictions: No    Therapy/Group: Individual Therapy  Elizebeth Koller, PT, DPT, CBIS  09/24/2021, 7:23 AM

## 2021-09-25 DIAGNOSIS — I639 Cerebral infarction, unspecified: Secondary | ICD-10-CM | POA: Diagnosis not present

## 2021-09-25 NOTE — Progress Notes (Signed)
Physical Therapy Session Note  Patient Details  Name: Maurice Little MRN: [id number] Date of Birth: 31-Jan-1956  Today's Date: 09/25/2021 PT Individual Time: 1440-1511 PT Individual Time Calculation (min): 31 min   Short Term Goals: Week 3:  PT Short Term Goal 1 (Week 3): = to LTGs based on ELOS  Skilled Therapeutic Interventions/Progress Updates:    Pt received sitting in standard wheelchair with his wife present and pt agreeable to therapy session. Pt/wife in agreement to focus session on family education with carryover of what has been taught so far. Therapist educated on w/c part management including brakes, flip back armrests, donning/doffing leg rests, and folding up chair to be placed in/out of vehicle - wife demonstrated understanding of this education.   Pt's wife able to correctly set-up wheelchair for squat pivot transfer to EOB with min cuing from patient - therapist educated on why setting up chair in this direction to allow easier transition then to supine in bed. Pt/wife performed squat pivot w/c<>EOB 2x demonstrating excellent technique with only min cuing/education from this therapist - confirmed that squat pivot transfers will be the most safe transfers compared to stand pivots.  Pt/wife participated in gait ~90ft EOB<>w/c 3x using RW - wife demonstrates excellent use of gait belt to assist patient with his balance providing heavy min/light mod assist as pt continues to demo R lean especially when turning to sit - pt demos improved management of AD requiring primarily min assist with verbal cuing for turning of AD.   Discussed plan to continue further hands-on training for additional gait as well as car transfer and repeat stair navigation - pt/wife in agreement with this plan. Pt performed sit>supine supervision. Pt left supine in bed in the care of his wife.  Therapy Documentation Precautions:  Precautions Precautions: Fall Precaution Comments: Ataxic R; L side  numbness Restrictions Weight Bearing Restrictions: No   Pain: No reports of pain throughout session.   Therapy/Group: Individual Therapy  Ginny Forth , PT, DPT, NCS, CSRS  09/25/2021, 12:28 PM

## 2021-09-25 NOTE — Progress Notes (Signed)
PROGRESS NOTE   Subjective/Complaints: Has spontaneously voided up to but still required cath due to residual volume of >341ml Had 1 episode of bladder spasms last noc but had some relief with pain med, off antispasmodics  ROS:  +Left sided numbness. Denies CP, SOB, N/V/D, no more bladder spasms   Objective:   No results found. Recent Labs    09/24/21 0542  WBC 7.4  HGB 10.9*  HCT 32.5*  PLT 311     Recent Labs    09/24/21 0542  NA 135  K 4.5  CL 101  CO2 27  GLUCOSE 97  BUN 15  CREATININE 1.09  CALCIUM 9.0      Intake/Output Summary (Last 24 hours) at 09/25/2021 0804 Last data filed at 09/25/2021 2330 Gross per 24 hour  Intake 720 ml  Output 3328 ml  Net -2608 ml         Physical Exam: Vital Signs Blood pressure 125/86, pulse 84, temperature 98.3 F (36.8 C), temperature source Oral, resp. rate 14, height 6' (1.829 m), SpO2 96 %.  General: No acute distress Mood and affect are appropriate Heart: Regular rate and rhythm no rubs murmurs or extra sounds Lungs: Clear to auscultation, breathing unlabored, no rales or wheezes Abdomen: Positive bowel sounds, soft nontender to palpation, nondistended Extremities: No clubbing, cyanosis, or edema Skin: No evidence of breakdown, no evidence of rash  Neuro: Alert Motor: 5/5 bilateral delt, bi, tri, grip, HF, KE ADF  Right facial weakness , diplopia with RIght lateral gaze  Dysarthria  Mild LUE ataxia Mild RUE dysmetria  Assessment/Plan: 1. Functional deficits which require 3+ hours per day of interdisciplinary therapy in a comprehensive inpatient rehab setting. Physiatrist is providing close team supervision and 24 hour management of active medical problems listed below. Physiatrist and rehab team continue to assess barriers to discharge/monitor patient progress toward functional and medical goals  Care Tool:  Bathing    Body parts bathed  by patient: Right arm, Left arm, Chest, Abdomen, Right upper leg, Left upper leg, Right lower leg, Left lower leg, Face, Front perineal area, Buttocks   Body parts bathed by helper: Front perineal area, Buttocks     Bathing assist Assist Level: Contact Guard/Touching assist     Upper Body Dressing/Undressing Upper body dressing   What is the patient wearing?: Pull over shirt    Upper body assist Assist Level: Set up assist    Lower Body Dressing/Undressing Lower body dressing      What is the patient wearing?: Pants     Lower body assist Assist for lower body dressing: Minimal Assistance - Patient > 75%     Toileting Toileting Toileting Activity did not occur (Clothing management and hygiene only): N/A (no void or bm) (foley in place)  Toileting assist Assist for toileting: Moderate Assistance - Patient 50 - 74%     Transfers Chair/bed transfer  Transfers assist     Chair/bed transfer assist level: Moderate Assistance - Patient 50 - 74% (stand pivot)     Locomotion Ambulation   Ambulation assist   Ambulation activity did not occur: Safety/medical concerns (requires +2 and skilled assist with use of hallway rail)  Assist level: 2 helpers (+2 mod A) Assistive device: Walker-rolling Max distance: 151ft   Walk 10 feet activity   Assist  Walk 10 feet activity did not occur: Safety/medical concerns        Walk 50 feet activity   Assist Walk 50 feet with 2 turns activity did not occur: Safety/medical concerns         Walk 150 feet activity   Assist Walk 150 feet activity did not occur: Safety/medical concerns         Walk 10 feet on uneven surface  activity   Assist Walk 10 feet on uneven surfaces activity did not occur: Safety/medical concerns         Wheelchair     Assist Is the patient using a wheelchair?: Yes   Wheelchair activity did not occur: Safety/medical concerns         Wheelchair 50 feet with 2 turns  activity    Assist            Wheelchair 150 feet activity     Assist          Blood pressure 125/86, pulse 84, temperature 98.3 F (36.8 C), temperature source Oral, resp. rate 14, height 6' (1.829 m), SpO2 96 %.    Medical Problem List and Plan: 1.  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  Impaired mobility: continue Lovenox added. may need to d/c if hematuria worsens              -antiplatelet therapy: ASA/Brilinta. 3. Pain Management: Tylenol prn for HA.  Occ facial shooting pains , likely CVA related as R CN 6 and 7 involved, may have CN 5 involvement as well, if this worsens may need gabapentin or topiramate 4. Mood: LCSW to follow for evaluation and support.              -antipsychotic agents: N/A 5. Neuropsych: This patient is capable of making decisions on his own behalf. 6. Skin/Wound Care: Routine pressure-relief measures. 7. Fluids/Electrolytes/Nutrition: Monitor I's/Os 8.  HTN with new hypotension/orthostaitc: Monitor BP TID.   Decreased Norvasc to 5 mg daily TEDs and ACE wraps and see how pt does.  Decreased hydralazine to 10 mg TID   Vitals:   09/24/21 1926 09/25/21 0436  BP: 124/85 125/86  Pulse: 81 84  Resp: 20 14  Temp: 98.3 F (36.8 C) 98.3 F (36.8 C)  SpO2: 98% 96%   Flomax increased Losartan 25 daily started on 11/20, continue 9.  Urinary retention: may have pontine micturition center involvement +/- BPH,  Continue Flomax, can consider increasing to 0.8mg  if still having difficulty after foley removal on Monday Cont voiding trial tolerating caths  10. Constipation: increased Senna S 2 po BID .   Miralax scheduled  Enema ordered per  family request 11.  Leukocytosis: Resolved 12.  Intermittent hypokalemia: Resolved 13.  Hyperglycemia: Likely stress related as Hemoglobin A1c 5.5. 14.  Dyslipidemia: Continue Lipitor 15. 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 16. Unable to close right eye lid. Peripheral 7 Right - c/o "film over R eye" which is normal with lacrilube  -ordered eye patch right  eye  -eye drops. May keep at bedside 17.  Pain with foley reinsertion, is draining clear urine, had some blood initially which is likely urethral irritation in combination with enoxaparin, check bladder scan ,discussed with urology Dr Junious Silk on call. PVR 0 ml, discussed foley size 48fr which is appropriate, CT abd unremarkable, mild enlargement of prostate Added scheduled Urispas TID and  hyoscyamine prn for bladder spasms seems to be helping  Pt would like night time dose of Urispas will change to Q 6h  Discussed retention with pt and wife, pt feels like bowel function is normalizing an dis hoping bladder does as well, will do another voiding trial ~48h off antispasmodic meds  Monitor for hematuia  LOS: 21 days A FACE TO FACE EVALUATION WAS PERFORMED  Charlett Blake 09/25/2021, 8:04 AM

## 2021-09-25 NOTE — Progress Notes (Signed)
Occupational Therapy Session Note  Patient Details  Name: Maurice Little MRN: [id number] Date of Birth: November 07, 1955  Today's Date: 09/25/2021 OT Individual Time: 4665-9935 OT Individual Time Calculation (min): 71 min    Short Term Goals: Week 3:  OT Short Term Goal 1 (Week 3): Pt will complete toilet transfers with min assist sit to stand. OT Short Term Goal 2 (Week 3): Pt will maintain dynamic sitting balance at min guard assist during selfcare tasks sitting unsupported. OT Short Term Goal 3 (Week 3): Pt will complete LB dressing with min assist for two consecutive sessions.  Skilled Therapeutic Interventions/Progress Updates:    Pt in bed to start session.  He was able to transfer to the EOB with supervision and then his spouse helped assist pt to the wheelchair stand pivot with min to mod assist and squat pivot at min assist.  Educated her on proper technique for squat vs stand pivot regarding foot placement and caregiver placement and sequencing.  Checked her off to assist pt with bed to chair transfers at this time.  Next, took pt down to the therapy gym where he transferred to the therapy mat in quadriped with min assist.  Worked on weightbearing through the RUE with modified pushups for two sets of 10 reps and transitions from quadriped to tall kneeling.  Min to mod assist needed for sustained tall kneeling secondary to forward LOB.  Had him transition from sidelying to quadriped when resting with work on lifting each UE off of the mat while stabilizing with the other and progressing to bird dog position.  Greater difficulty noted when attempting to stabilize on the right knee while reaching out with the right arm and trying to extend the left leg.  Max assist needed to maintain the position for greater than 5 seconds.  Transitioned to sitting EOM with work on sit to stand transitions at min assist without UE use and sustained standing balance while completing horizontal gaze stabilization  exercises.  Min assist for balance while completing exercises for 30 seconds to 1 minute.  Increased dyspnea noted throughout session with activity with HR increasing up to the low 120's with Oxygen sats at 96% on room air.  Returned to the wheelchair at squat pivot transfer and min assist with return to the room where he remained sitting up in the wheelchair with the call button and phone in reach and his spouse present.    Therapy Documentation Precautions:  Precautions Precautions: Fall Precaution Comments: Ataxic R; L side numbness Restrictions Weight Bearing Restrictions: No  Pain: Pain Assessment Pain Scale: Faces Faces Pain Scale: No hurt    Therapy/Group: Individual Therapy  Cantrell Martus OTR/L 09/25/2021, 3:40 PM

## 2021-09-25 NOTE — Progress Notes (Signed)
Patient and wife educated on learning how to straight cath patient. Patient was shown proper cleansing technique, insertion of  coude catheter, measuring urine and post hygiene. Patient will attempt to self cath this evening.

## 2021-09-25 NOTE — Progress Notes (Signed)
Occupational Therapy Session Note  Patient Details  Name: Maurice Little MRN: [id number] Date of Birth: October 20, 1956  Today's Date: 09/25/2021 OT Individual Time: 2993-7169 OT Individual Time Calculation (min): 58 min    Short Term Goals: Week 3:  OT Short Term Goal 1 (Week 3): Pt will complete toilet transfers with min assist sit to stand. OT Short Term Goal 2 (Week 3): Pt will maintain dynamic sitting balance at min guard assist during selfcare tasks sitting unsupported. OT Short Term Goal 3 (Week 3): Pt will complete LB dressing with min assist for two consecutive sessions.  Skilled Therapeutic Interventions/Progress Updates:    Pt completed bathing and dressing during session with his spouse present for family education.  Max assist was needed for transfer to the bathroom from the EOB, with pt requesting to toilet first before shower.  Min assist needed for clothing management as well as min guard for toilet hygiene in sitting with lateral lean.  Mod assist for stand pivot transfer from the toilet to the walk-in shower with use of the RW for support.  He was able to complete all bathing with min guard assist in sitting.  Lateral leans were also incorporated for washing his buttocks.  Educated pt and spouse on need to complete all drying off in sitting and then performing transfer over to the wheelchair or EOB for dressing tasks.  Mod assist to ambulate out to the EOB with use of the RW for support.  He was able to complete UB dressing at min guard assist and then LB dressing at min assist level sit to stand.  Once he finished, he requested to rest in the bed until next PT session.  Pt still with increased ataxia in the RLE and trunk with increased right lean still noted during mobility.    Therapy Documentation Precautions:  Precautions Precautions: Fall Precaution Comments: Ataxic R; L side numbness Restrictions Weight Bearing Restrictions: No  Pain: Pain Assessment Pain Scale: Faces Pain  Score: 0-No pain ADL: See Care Tool Section for some details of mobility and selfcare   Therapy/Group: Individual Therapy  Veronnica Hennings OTR/L 09/25/2021, 12:09 PM

## 2021-09-25 NOTE — Progress Notes (Signed)
Physical Therapy Session Note  Patient Details  Name: Maurice Little MRN: [id number] Date of Birth: [birth year]  Today's Date: 09/25/2021 Cotx time: 1045-1145, 60 mins     Short Term Goals: Week 3:  PT Short Term Goal 1 (Week 3): = to LTGs based on ELOS  Skilled Therapeutic Interventions/Progress Updates:  Patient received sitting up in bed, agreeable to PT. He denies pain. Wife at bedside. Patient able to come sit edge of bed with supervision. Wife transferring patient to wc via stand pivot with assist and PT supervision. PT transporting patient in wc to therapy gym for time management and energy conservation. Patient transferring onto physioball with MinA to stand and MaxiSky for fall precaution. Patient able to maintain fair+ static sitting balance on physioball with close supervision/ CGA. Progressing to reaching within, then outside, BOS manipulating cones with minimal trunk rotation. Patient progressing to 1kg ball toss at rebounder with emphasis on anticipatory and reactionary balance. Patient completing truncal rotation on physioball with 2kg ball reaching outside BOS. Patient with tendency to lean to R, but able to correct with multimodal cues and approximation of L LE. Patient able to stand with MinA and RW to sit back into wc.   Pt performed sit <>stand x2 from [facility] w/CGA and ambulated 120' w/RW and CGA w/ 2 turns, regressing to min A as he fatigued due to R trunk lean, L hip weakness and poor management of AD. Noted WBOS, R trunk lean, pushing of RW away from body, shuffling of gait w/turns and decreased sagittal plane rotation. Max tactile cues provided to glute med and hip ERs during LLE stance phase to facilitate muscle activation and minimize R trunk lean. Min verbal cues provided for midline orientation, turn management and maintaining safe distance to AD. Progressed to ambulation w/posterior resistance via red theraband to facilitate anterior weight shift and activation of hip ERs. Pt  ambulated 55' w/sustained posterior resistance w/min A for R trunk lean. Progressed to unilateral posterior pertubations via red theraband during TO to IC of stance leg for isolated tactile cue for hip ER activation of stance leg for reduction of R trunk lean and reduction in WBOS w/min A for steadying assist. Stand <>sit to [facility] w/CGA and pt transported back to room w/total A 2/2 fatigue. Sit <>stand pivot to bed w/min A and no AD and pt performed sit <>supine w/CGA. Pt was left supine in bed, attempting to void, all needs in reach.    Therapy Documentation Precautions:  Precautions Precautions: Fall Precaution Comments: Ataxic R; L side numbness Restrictions Weight Bearing Restrictions: No   Therapy/Group: Co-Treatment  Elizebeth Koller, PT, DPT, CBIS Jill Alexanders Nakyiah Kuck, PT, DPT   09/25/2021, 10:21 AM

## 2021-09-26 DIAGNOSIS — I639 Cerebral infarction, unspecified: Secondary | ICD-10-CM | POA: Diagnosis not present

## 2021-09-26 NOTE — Progress Notes (Addendum)
Occupational Therapy Session Note  Patient Details  Name: Maurice Little MRN: [id number] Date of Birth: 12-13-1955  Today's Date: 09/26/2021 OT Individual Time: 2751-7001 OT Individual Time Calculation (min): 35 min   OT Individual Time: 7494-4967 OT Individual Time Calculation (min): 66 min   Short Term Goals: Week 3:  OT Short Term Goal 1 (Week 3): Pt will complete toilet transfers with min assist sit to stand. OT Short Term Goal 2 (Week 3): Pt will maintain dynamic sitting balance at min guard assist during selfcare tasks sitting unsupported. OT Short Term Goal 3 (Week 3): Pt will complete LB dressing with min assist for two consecutive sessions.  Skilled Therapeutic Interventions/Progress Updates:  Session 1: Patient met lying supine in bed. Patient expressed frustration with several events including change in schedule (patient not initially scheduled to see this OT), not being able to see MD prior to start of OT treatment session and inability to see neuro psych MD prior to tentative d/c date. Patient also displeased with need to self cath at time of d/c.This Probation officer gave patient time to express himself and offered emotional support. Wife present at bedside as well. Patient with request to defer OT session until seen by MD. This writer obliged. MD and wife in conference with patient for a great length of time resulting in patient missing 35 minutes of OT treatment session. Upon return patient in agreement with treatment session with focus on self-care re-education, functional transfers and family education. Patient reporting 0/10 pain reported at rest and with activity. Wife assisted patient with toilet transfer, 3/3 parts of toileting task and hand hygiene standing at sink level. Wife demonstrates good body mechanics and ability to clearly and concisely provide mutlmodal cueing for patient. Session concluded with patient lying supine in bed with call bell within reach all needs met.   Session 2:  2nd session with focus on self-care re-education, family ed. and NMR. Wife assisted patient with functional mobility to bathroom. Walk-in shower transfer, UB/LB bathing/dressing and toileting completed with assist from wife. Wife provided great multimodal cueing for safety and orientation to midline. End of session spent educating patient/wife on compensatory strategies to further increase safety/independence with self-care tasks and [facility] HEP (written handout provided). Opportunity provided for questions. All patient/family questions answered to their satisfaction. Wife provided set-up assist for lunch meal. Call bell within reach and all needs met.     Therapy Documentation Precautions:  Precautions Precautions: Fall Precaution Comments: Ataxic R; L side numbness Restrictions Weight Bearing Restrictions: No General: General OT Amount of Missed Time: 34 Minutes  Therapy/Group: Individual Therapy  Griffith Santilli R [place]Davis 09/26/2021, 7:00 AM

## 2021-09-26 NOTE — Progress Notes (Signed)
Patient ID: Maurice Little, male   DOB: 24-Jan-1956, 65 y.o.   MRN: [id number] Follow up on patient post foley removal and voiding trial vs intermittent catheterization. Nurse noted patient required intermittent cath due to bladder scan volumes. Education/training on self catheterizations continued and patient able to cath self. Patient given sample pack of catheters to test and confirm product selection of choice for discharge. Continue to follow along for educational needs. Pamelia Hoit

## 2021-09-26 NOTE — Progress Notes (Signed)
Inpatient Rehabilitation Discharge Medication Review by a Pharmacist  A complete drug regimen review was completed for this patient to identify any potential clinically significant medication issues.  High Risk Drug Classes Is patient taking? Indication by Medication  Antipsychotic No   Anticoagulant No   Antibiotic No   Opioid No   Antiplatelet Yes Aspirin and Brilinta for CVA  Hypoglycemics/insulin No   Vasoactive Medication Yes Amlodipine, hydralazine, losartan for BP  Chemotherapy No   Other No      Type of Medication Issue Identified Description of Issue Recommendation(s)  Drug Interaction(s) (clinically significant)     Duplicate Therapy     Allergy     No Medication Administration End Date  Ticagrelor Per Neurology: Continue Ticagrelor/aspirin combo for 3 months, and then start aspirin alone. End date for Ticagrelor: November 20, 2021  Incorrect Dose     Additional Drug Therapy Needed     Significant med changes from prior encounter (inform family/care partners about these prior to discharge).    Other       Clinically significant medication issues were identified that warrant physician communication and completion of prescribed/recommended actions by midnight of the next day:  No   Pharmacist comments: None  Time spent performing this drug regimen review (minutes):  20 minutes   Bronwen Pendergraft BS, PharmD, BCPS Clinical Pharmacist 09/26/2021 9:55 AM

## 2021-09-26 NOTE — Progress Notes (Signed)
Occupational Therapy Discharge Summary  Patient Details  Name: Maurice Little MRN: [id number] Date of Birth: 1956/10/15  Patient has met 54 of 83 long term goals due to improved balance, postural control, ability to compensate for deficits, functional use of  RIGHT upper extremity, and improved coordination.  Patient to discharge at [facility] Assist level.  Patient's care partner is independent to provide the necessary physical assistance at discharge.    Reasons goals not met: Although improved since initial evaluation, patient with continued deficits including muscle weakness and paralysis, impaired timing and sequencing, ataxia, decreased coordination, decreased visual motor skills, sitting/standing balance deficits, decreased postural control, hemiplegia, and decreased balance strategies. Patient continues to require intermittent Min guard to maintain dynamic sitting balance.  Recommendation:  Patient will benefit from ongoing skilled OT services in  [facility] vs OPOT  to continue to advance functional skills in the area of BADL and Reduce care partner burden.  Equipment: Patient declined 3-in-1. Wife independently purchased tub bench and has found someone to install grab bars in bathroom.   Reasons for discharge: discharge from hospital  Patient/family agrees with progress made and goals achieved: Yes  OT Discharge Precautions/Restrictions  Precautions Precautions: Fall Precaution Comments: Ataxic R; L side numbness General OT Amount of Missed Time: 34 Minutes Vital Signs Therapy Vitals Temp: 97.9 F (36.6 C) Temp Source: Oral Pulse Rate: 89 Resp: 20 BP: 136/78 Patient Position (if appropriate): Lying Oxygen Therapy SpO2: 96 % O2 Device: Room Air Pain Pain Assessment Pain Scale: 0-10 Pain Score: 0-No pain ADL ADL Eating: Modified independent Where Assessed-Eating: Bed level Grooming: Supervision/safety Where Assessed-Grooming: Sitting at sink Upper Body Bathing:  Supervision/safety Where Assessed-Upper Body Bathing: Shower Lower Body Bathing: Contact guard Where Assessed-Lower Body Bathing: Shower (Lateral leans) Upper Body Dressing: Setup Where Assessed-Upper Body Dressing: Edge of bed Lower Body Dressing: Contact guard Where Assessed-Lower Body Dressing: Edge of bed Toileting: Contact guard Where Assessed-Toileting: Glass blower/designer: Therapist, music Method: Arts development officer: Radiographer, therapeutic: Not assessed Social research officer, government: Curator Method: Heritage manager: Civil engineer, contracting with back Vision Baseline Vision/History: 1 Wears glasses Patient Visual Report: Diplopia;Blurring of vision Vision Assessment?: Vision impaired- to be further tested in functional context Eye Alignment: Impaired (comment) Ocular Range of Motion: Restricted on the left Alignment/Gaze Preference: Head tilt;Gaze right Tracking/Visual Pursuits: Decreased smoothness of horizontal tracking;Decreased smoothness of vertical tracking;Other (comment) Saccades: Decreased speed of saccadic movement;Impaired - to be further tested in functional context;Additional head turns occurred during testing Diplopia Assessment: Disappears with one eye closed;Objects split side to side;Present all the time/all directions Depth Perception: Overshoots;Undershoots Perception  Perception: Impaired Spatial Orientation: continues to have impaired midline orientation with R lateral lean in standing though improving Praxis Praxis: Intact Cognition Overall Cognitive Status: Within Functional Limits for tasks assessed Arousal/Alertness: Awake/alert Orientation Level: Oriented X4 Year: 2022 Month: November Attention: Focused;Sustained;Selective Focused Attention: Appears intact Sustained Attention: Appears intact Selective Attention: Appears intact Memory: Appears intact Immediate  Memory Recall: Sock;Blue;Bed Memory Recall Sock: Without Cue Memory Recall Blue: Without Cue Memory Recall Bed: Without Cue Awareness: Appears intact Problem Solving: Appears intact Executive Function: Reasoning;Self Monitoring;Self Correcting Reasoning: Appears intact Self Monitoring: Appears intact Self Correcting: Appears intact Safety/Judgment: Impaired Comments: Mild impulsivity Sensation Sensation Light Touch: Impaired Detail Central sensation comments: able to feel light touch but it is with abnormal sensation of "numbness and tingling" Light Touch Impaired Details: Impaired LUE Hot/Cold: Impaired by gross assessment (Impaired on L)  Proprioception: Impaired Detail (Impaired grossly) Coordination Gross Motor Movements are Fluid and Coordinated: No Coordination and Movement Description: gross motor movements impaired due to truncal and R hemibody ataxia; improved since initial evaluation Motor  Motor Motor: Ataxia;Abnormal postural alignment and control Motor - Discharge Observations: R hemibody and truncal ataxia; improved since initial evaluation Mobility  Bed Mobility Bed Mobility: Supine to Sit;Sit to Supine Supine to Sit: Supervision/Verbal cueing Sit to Supine: Supervision/Verbal cueing Transfers Sit to Stand: Contact Guard/Touching assist Stand to Sit: Contact Guard/Touching assist  Trunk/Postural Assessment  Cervical Assessment Cervical Assessment: Within Functional Limits Thoracic Assessment Thoracic Assessment: Within Functional Limits Lumbar Assessment Lumbar Assessment: Within Functional Limits  Balance Balance Balance Assessed: Yes Static Sitting Balance Static Sitting - Balance Support: Feet supported Static Sitting - Level of Assistance: 5: Stand by assistance Dynamic Sitting Balance Dynamic Sitting - Balance Support: During functional activity Dynamic Sitting - Level of Assistance: Other (comment) (Min guard) Static Standing Balance Static  Standing - Balance Support: During functional activity Static Standing - Level of Assistance: 4: Min assist Dynamic Standing Balance Dynamic Standing - Balance Support: During functional activity Dynamic Standing - Level of Assistance: 3: Mod assist Extremity/Trunk Assessment RUE Assessment RUE Assessment: Exceptions to [facility] Passive Range of Motion (PROM) Comments: WFL Active Range of Motion (AROM) Comments: WFL General Strength Comments: strength 5/5 throughout with severe ataxia noted proximally with functional use and finger to nose LUE Assessment LUE Assessment: Exceptions to [facility] Passive Range of Motion (PROM) Comments: PROM WFL Active Range of Motion (AROM) Comments: WFL General Strength Comments: 4/5 throughout   Allicia Culley R [place]Davis 09/26/2021, 2:13 PM

## 2021-09-26 NOTE — Progress Notes (Signed)
Patient ID: Maurice Little, male   DOB: 04/06/1956, 65 y.o.   MRN: [id number]  Pt referral sent to [employer] for cath supplies

## 2021-09-26 NOTE — Progress Notes (Signed)
PROGRESS NOTE   Subjective/Complaints: Still requiring I/O cath, pt per RN did well with this but pt still not confident.  Only had one training session.  Pt requesting Neuropsych visit prior to d/c if possible   ROS:  +Left sided numbness. Denies CP, SOB, N/V/D, no more bladder spasms   Objective:   No results found. Recent Labs    09/24/21 0542  WBC 7.4  HGB 10.9*  HCT 32.5*  PLT 311     Recent Labs    09/24/21 0542  NA 135  K 4.5  CL 101  CO2 27  GLUCOSE 97  BUN 15  CREATININE 1.09  CALCIUM 9.0      Intake/Output Summary (Last 24 hours) at 09/26/2021 0854 Last data filed at 09/26/2021 0830 Gross per 24 hour  Intake 1312 ml  Output 1075 ml  Net 237 ml         Physical Exam: Vital Signs Blood pressure 128/83, pulse 80, temperature 98.6 F (37 C), temperature source Oral, resp. rate 18, height 6' (1.829 m), weight 94.9 kg, SpO2 96 %.  General: No acute distress Mood and affect are appropriate Heart: Regular rate and rhythm no rubs murmurs or extra sounds Lungs: Clear to auscultation, breathing unlabored, no rales or wheezes Abdomen: Positive bowel sounds, soft nontender to palpation, nondistended Extremities: No clubbing, cyanosis, or edema Skin: No evidence of breakdown, no evidence of rash  Neuro: Alert Motor: 5/5 bilateral delt, bi, tri, grip, HF, KE ADF  Right facial weakness , diplopia with RIght lateral gaze  Dysarthria  Mild LUE ataxia Mild RUE dysmetria  Assessment/Plan: 1. Functional deficits which require 3+ hours per day of interdisciplinary therapy in a comprehensive inpatient rehab setting. Physiatrist is providing close team supervision and 24 hour management of active medical problems listed below. Physiatrist and rehab team continue to assess barriers to discharge/monitor patient progress toward functional and medical goals  Care Tool:  Bathing    Body parts bathed by  patient: Right arm, Left arm, Chest, Abdomen, Right upper leg, Left upper leg, Right lower leg, Left lower leg, Face, Front perineal area, Buttocks   Body parts bathed by helper: Front perineal area, Buttocks     Bathing assist Assist Level: Contact Guard/Touching assist     Upper Body Dressing/Undressing Upper body dressing   What is the patient wearing?: Pull over shirt    Upper body assist Assist Level: Set up assist    Lower Body Dressing/Undressing Lower body dressing      What is the patient wearing?: Pants     Lower body assist Assist for lower body dressing: Minimal Assistance - Patient > 75%     Toileting Toileting Toileting Activity did not occur (Clothing management and hygiene only): N/A (no void or bm) (foley in place)  Toileting assist Assist for toileting: Moderate Assistance - Patient 50 - 74%     Transfers Chair/bed transfer  Transfers assist     Chair/bed transfer assist level: Minimal Assistance - Patient > 75% (squat pivot)     Locomotion Ambulation   Ambulation assist   Ambulation activity did not occur: Safety/medical concerns (requires +2 and skilled assist with use of  hallway rail)  Assist level: Minimal Assistance - Patient > 75% Assistive device: Walker-rolling Max distance: 55ft   Walk 10 feet activity   Assist  Walk 10 feet activity did not occur: Safety/medical concerns        Walk 50 feet activity   Assist Walk 50 feet with 2 turns activity did not occur: Safety/medical concerns         Walk 150 feet activity   Assist Walk 150 feet activity did not occur: Safety/medical concerns         Walk 10 feet on uneven surface  activity   Assist Walk 10 feet on uneven surfaces activity did not occur: Safety/medical concerns         Wheelchair     Assist Is the patient using a wheelchair?: Yes   Wheelchair activity did not occur: Safety/medical concerns         Wheelchair 50 feet with 2 turns  activity    Assist            Wheelchair 150 feet activity     Assist          Blood pressure 128/83, pulse 80, temperature 98.6 F (37 C), temperature source Oral, resp. rate 18, height 6' (1.829 m), weight 94.9 kg, SpO2 96 %.    Medical Problem List and Plan: 1.  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  Impaired mobility: continue Lovenox added. may need to d/c if hematuria worsens              -antiplatelet therapy: ASA/Brilinta. 3. Pain Management: Tylenol prn for HA.  Occ facial shooting pains , likely CVA related as R CN 6 and 7 involved, may have CN 5 involvement as well, if this worsens may need gabapentin or topiramate 4. Mood: LCSW to follow for evaluation and support.              -antipsychotic agents: N/A 5. Neuropsych: This patient is capable of making decisions on his own behalf. 6. Skin/Wound Care: Routine pressure-relief measures. 7. Fluids/Electrolytes/Nutrition: Monitor I's/Os 8.  HTN with new hypotension/orthostaitc: Monitor BP TID.   Decreased Norvasc to 5 mg daily TEDs and ACE wraps and see how pt does.  Decreased hydralazine to 10 mg TID   Vitals:   09/25/21 1912 09/26/21 0427  BP: 124/79 128/83  Pulse: 90 80  Resp: 18 18  Temp: 98.5 F (36.9 C) 98.6 F (37 C)  SpO2: 97% 96%   Flomax increased Losartan 25 daily started on 11/20, continue 9.  Urinary retention: may have pontine micturition center involvement +/- BPH,  Continue Flomax, can consider increasing to 0.8mg  if still having difficulty after foley removal on Monday Cont voiding trial tolerating caths  10. Constipation: increased Senna S 2 po  BID .   Miralax scheduled  Enema ordered per family request 11.  Leukocytosis: Resolved 12.  Intermittent hypokalemia: Resolved 13.  Hyperglycemia: Likely stress related as Hemoglobin A1c 5.5. 14.  Dyslipidemia: Continue Lipitor 15. 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 16. Unable to close right eye lid. Peripheral 7 Right -  c/o "film over R eye" which is normal with lacrilube  -ordered eye patch right eye  -eye drops. May keep at bedside 17.  Urinary retention, discussed I/O cath as preferred management if pt can learn.  Pt feels with additional training , he should be comfortable with procedure.  RN feels like pt can learn this.  Discussed meds, Flomax at max dose, unable to use bethanechol due to bladder spasms.  CT abd unremarkable, mild enlargement of prostate    LOS: 22 days A FACE TO FACE EVALUATION WAS PERFORMED  Erick Colace 09/26/2021, 8:54 AM

## 2021-09-26 NOTE — Progress Notes (Signed)
Patient ID: Maurice Little, male   DOB: Mar 04, 1956, 65 y.o.   MRN: [id number]  Team Conference Report to Patient/Family  Team Conference discussion was reviewed with the patient and caregiver, including goals, any changes in plan of care and target discharge date.  Patient and caregiver express understanding and are in agreement.  The patient has a target discharge date of 09/27/21.  SW met with pt and spouse in room. Pt ready for discharge on 12/1. Patient informed SW he has been self-cath'ing and will have another education session today with his nurse, Kennyth Lose. Pt reports feeling confident, just wanting to continue to practice. SW will order caths for pt at d/c. Handicapped application provided to patient spouse.Pt OP referral faxed to neuro OP. No current questions or concern for sw, sw will follow up tomorrow.   Dyanne Iha 09/26/2021, 1:46 PM

## 2021-09-26 NOTE — Progress Notes (Signed)
Patient and his wife Maurice Little completed straight cath successful without any assistance. Patient tolerated well, 600 clear yellow urine noted. Denies pain or discomfort. Call light and personal items within reach

## 2021-09-26 NOTE — Patient Care Conference (Signed)
Inpatient RehabilitationTeam Conference and Plan of Care Update Date: 09/26/2021   Time: 10:55 AM    Patient Name: Maurice Little      Medical Record Number: [id number]  Date of Birth: 08-Apr-1956 Sex: Male         Room/Bed: 4M09C/4M09C-01 Payor Info: Payor: Advertising copywriter / Plan: [employer] OTHER / Product Type: *No Product type* /    Admit Date/Time:  09/04/2021  3:06 PM  Primary Diagnosis:  Cerebellar stroke [facility])  Hospital Problems: Principal Problem:   Cerebellar stroke (HCC) Active Problems:   Dyslipidemia   Slow transit constipation   Benign essential HTN    Expected Discharge Date: Expected Discharge Date: 09/27/21  Team Members Present: Physician leading conference: Dr. Claudette Laws Social Worker Present: Lavera Guise, BSW Nurse Present: Chana Bode, RN PT Present: Casimiro Needle, PT OT Present: Annye English, OT SLP Present: Eilene Ghazi, SLP PPS Coordinator present : Fae Pippin, SLP     Current Status/Progress Goal Weekly Team Focus  Bowel/Bladder   Patient continent of Bowel and Bladder. Last BM: 11/28. Foley removed 11/28. Minimal output requiring Q 6 hour I/O cath.  Patient will regain bladder emptying indipendently  Assess toiletting needs Q 2 hours and PRN   Swallow/Nutrition/ Hydration             ADL's   Supervision for UB selfcare with min assisit for LB bathing and dressing,  Min assist squat pivot transfers with min to mod for stand pivot.  mod assist for functional mobility with use of the walker secondary to ataxia and right lean/LOB.  RUE ataxia but much improved since eval.  contact guard overall  selfcare retraining, neuromuscular re-education, balance retraining, transfer training, DME education, therapeutic exercise, vestibular rehab, pt/family education   Mobility   supervision bed mobility, CGA/min assist sit<>stand, min/mod assist stand pivot transfers using RW, min/mod assist gait up to 15ft using RW, min assist 4  steps using B UE support on R HR - continues to demonstrate truncal and R hemibody ataxia although much improved compared to at eval - due to significant gains thus far and excellent family support patient may benefit from extending CIR LOS to further advance independence with functional mobility at ambulatory level and decrease fall risk  CGA overall at ambulatory level  transfer training, dynamic standing balance, activity tolerance, dynamic gait training, pt/family education, stair navigation training, R hemibody NMR   Communication             Safety/Cognition/ Behavioral Observations            Pain   Patient reports 5/10 pain to bladder. Bladder spasms.  Pain <3  Assess pain Q shift and PRN   Skin   Skin is clean, dry, and intact.  Pt skin remain free of injury  Assess skin Q shift and PRN     Discharge Planning:  Discharging home on 12/1. OP referral sent to Neuro OP. DME ordered through [employer].   Team Discussion: Patient is making slow progress.  Patient on target to meet rehab goals: no, set backs, delayed progress limited progression. Currently CGA - Min - Mod assist overall.  *See Care Plan and progress notes for long and short-term goals.   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: Neurogenic Bowel & Bladder   Possible Resolutions to Barriers/Weekly Focus: instruct pt/wife on I/O cath technique   Continued Need for Acute Rehabilitation Level of Care: The  patient requires daily medical management by a physician with specialized training in physical medicine and rehabilitation for the following reasons: Direction of a multidisciplinary physical rehabilitation program to maximize functional independence : Yes Medical management of patient stability for increased activity during participation in an intensive rehabilitation regime.: Yes Analysis of laboratory values and/or radiology reports with any subsequent need for medication adjustment and/or medical intervention. : Yes   I attest that I was present, lead the team conference, and concur with the assessment and plan of the team.   Chana Bode B 09/26/2021, 5:16 PM

## 2021-09-26 NOTE — Progress Notes (Signed)
Physical Therapy Session Note  Patient Details  Name: Maurice Little MRN: [id number] Date of Birth: 1956-06-03  Today's Date: 09/26/2021 PT Individual Time: 1406-1505 PT Individual Time Calculation (min): 59 min   Short Term Goals: Week 3:  PT Short Term Goal 1 (Week 3): = to LTGs based on ELOS  Skilled Therapeutic Interventions/Progress Updates:    Pt received supine in bed with his wife, Scherry Ran, present and pt agreeable to therapy session with plan for focus on hands-on family education/training. Supine>sitting L EOB, HOB slightly elevated, with supervision. R squat pivot EOB>w/c with CGA for steadying and pt demoing excellent set-up and sequencing of transfer; however, requires 2nd attempt due to pulling too hard on w/c armrest the 1st time causing w/c to turn. Transported to/from main hospital entrance to perform real-life car transfer training.  Car transfer 2x [employer] [place]) via stand pivot to/from w/c with pt's wife providing hands-on assistance of heavy min assist with therapist cuing and problem solving for proper sequencing to ensure pt/wife safety - pt's wife demonstrates excellent cuing of patient along with excellent body mechanics and positioning to safely assist patient - pt uses B UE support on car for balance with good safety.  Transported back up to CIR.  Discussed home entry with focus on planning out sequencing of getting from the car inside the home with plan of:  Transfer from car into w/c as performed above Ride in wheelchair to bottom of stairs from car (positioned at an angle at bottom of steps for safety) Wife set-up chair and RW at top of steps and open house door Wife assist pt up the stairs to safely sit in chair at top (also positioned at an angle so pt can keep 1 UE support on stair handrail and 1 UE support on chair armrest when going to sit  Wife/pt in agreement with this plan.  Pt ascended/descended 4 (6" height) steps using B UE support on RHR only to simulate  front door entrance with pt's wife providing proper heavy min assist for balance due to pt continuing to have R lean - pt performed via side-step pattern - pt's wife demonstrates excellent set-up and hands-on assistance to maintain patient safety as well as excellent body mechanics and cuing to patient on proper sequencing.  Plan for additional hands-on training tomorrow prior to D/C home to answer any final questions or concerns.Pt's wife transported him back to room in w/c and pt left in her care as she is safe to assist him back to bed via squat pivot.  Therapy Documentation Precautions:  Precautions Precautions: Fall Precaution Comments: Ataxic R; L side numbness Restrictions Weight Bearing Restrictions: No   Pain:  No reports of pain throughout session.   Therapy/Group: Individual Therapy  Ginny Forth , PT, DPT, NCS, CSRS  09/26/2021, 7:17 PM

## 2021-09-26 NOTE — Discharge Summary (Signed)
Physical Therapy Discharge Summary  Patient Details  Name: Maurice Little MRN: [id number] Date of Birth: January 20, 1956  Today's Date: 09/27/2021 PT Individual Time: 0905-1003 PT Individual Time Calculation (min): 58 min    Patient has met 4 of 9 long term goals due to improved activity tolerance, improved balance, improved postural control, increased strength, ability to compensate for deficits, functional use of  right upper extremity and right lower extremity, improved attention, improved awareness, and improved coordination.  Patient to discharge at a limited household ambulatory level using RW with min assist and then requires wheelchair for community mobility. Patient's care partner attended hands-on education/training and is independent to provide the necessary physical and cognitive assistance at discharge.  Reasons goals not met: Therapy team was anticipating a possible extension of pt's LOS to continue progression towards CGA level goals; however, pt/family felt comfortable assisting patient at current LOF therefore team determined pt safe to D/C home at this time and continue progression of mobility on outpatient basis. Pt continues to require up to min assist for stand pivot transfers at times when fatigued as well as up to mod assist for longer distance gait using RW due to fatigue resulting in worsening R lean. Pt requires min assist for stair navigation using home set-up with B UE support on 1 rail.  Recommendation:  Patient will benefit from ongoing skilled PT services in outpatient setting to continue to advance safe functional mobility, address ongoing impairments in ataxia, standing balance, gait, R hemibody NMR, endurance, and minimize fall risk.  Equipment: 20x18 standard wheelchair with cushion and RW  Reasons for discharge: discharge from hospital  Patient/family agrees with progress made and goals achieved: Yes  Skilled Therapeutic Interventions/Progress Updates:  Pt received  sitting in w/c with his wife, [name], present and pt/family agreeable to therapy session with focus on final hands-on training/education in preparation for D/C home today. Transported towards ADL apartment. Pt's wife providing all hands-on assistance today and demonstrates excellent cuing and communication with patient, excellent set-up of environment with good safety awareness, and excellent body mechanics and use of gait belt when assisting patient.   Pt ambulates ~51f into ADL apartment using RW with pt's wife providing CGA/min assist. [place]. Ambulates ~1223fto main therapy gym using RW with wife providing CGA/min assist - wife does excellent job of cuing pt to pause and reorient to midline when he starts to have R lean. While walking, pt overall demos a very rigid trunk posturing with very short steps and wide BOS in order to maintain his balance.  Stair navigation ascending/descending R steps using side-step technique with B UE support on RHR - pt's wife sets up environment without cuing and provides min assist to patient - they are able to carryover education previously provided without cuing.  Provided with the following HEP printout. Pt/wife performed the exercises at the kitchen counter to ensure they felt safe and confident with performing these at home independently and therapist/pt/wife felt confident in their ability to do this.   Access Code: [email or web address] URL: [email or web address] Date: 09/27/2021 Prepared by: CaPage SpiroExercises Supine Bridge - 1 x daily - 7 x weekly - 2 sets - 15 reps Sit to Stand with Counter Support - 1 x daily - 7 x weekly - 2 sets - 10 reps Alternating Step Taps with Counter Support - 1 x daily - 7 x weekly - 2 sets - 10 reps Side Stepping with Counter Support -  1 x daily - 7 x weekly - 2 sets - 10 reps Walking - 1 x daily - 7 x weekly - 2 sets - 10 reps  Pt/wife report no further  questions/concerns and report feeling comfortable with D/C home today. Pt left seated in w/c in the care of his wife.  PT Discharge Precautions/Restrictions Precautions Precautions: Fall Precaution Comments: R side and truncal Ataxic; L side numbness Restrictions Weight Bearing Restrictions: No Pain Pain Assessment Pain Scale: 0-10 Pain Score: 0-No pain Pain Interference Pain Interference Pain Effect on Sleep: 1. Rarely or not at all Pain Interference with Therapy Activities: 1. Rarely or not at all Pain Interference with Day-to-Day Activities: 1. Rarely or not at all Vision/Perception  Vision - History Ability to See in Adequate Light: 1 Impaired Perception Perception: Impaired Spatial Orientation: continues to have impaired midline orientation with R lateral lean in standing though improving Praxis Praxis: Intact  Cognition Overall Cognitive Status: Within Functional Limits for tasks assessed Arousal/Alertness: Awake/alert Orientation Level: Oriented X4 Year: 2022 Month: November Day of Week: Correct Attention: Focused;Sustained;Selective;Alternating Focused Attention: Appears intact Sustained Attention: Appears intact Selective Attention: Appears intact Alternating Attention: Appears intact Memory: Appears intact Awareness: Appears intact Problem Solving: Appears intact Safety/Judgment: Appears intact Sensation Sensation Light Touch: Impaired Detail Central sensation comments: able to feel light touch in L hemibody but it is with abnormal sensation of "numbness and tingling" Light Touch Impaired Details: Impaired LUE;Impaired LLE Hot/Cold: Not tested Proprioception: Impaired by gross assessment Proprioception Impaired Details: Impaired RLE;Impaired LLE Coordination Gross Motor Movements are Fluid and Coordinated: No Coordination and Movement Description: gross motor movements impaired due to truncal and R hemibody ataxia; however, improved since initial  evaluation Heel Shin Test: R LE still with ataxia, L LE WFL Motor  Motor Motor: Ataxia;Abnormal postural alignment and control Motor - Discharge Observations: R hemibody and truncal ataxia; improved since initial evaluation  Mobility Bed Mobility Bed Mobility: Supine to Sit;Sit to Supine Supine to Sit: Supervision/Verbal cueing Sit to Supine: Supervision/Verbal cueing Transfers Transfers: Sit to Stand;Stand to Sit;Stand Pivot Transfers;Squat Pivot Transfers Sit to Stand: Contact Guard/Touching assist Stand to Sit: Contact Guard/Touching assist Stand Pivot Transfers: Contact Guard/Touching assist;Minimal Assistance - Patient > 75% Stand Pivot Transfer Details: Verbal cues for technique;Verbal cues for gait pattern;Verbal cues for safe use of DME/AE;Verbal cues for sequencing;Tactile cues for weight shifting;Tactile cues for sequencing;Manual facilitation for weight shifting Squat Pivot Transfers: Contact Guard/Touching assist Transfer (Assistive device): Rolling walker (for sit to stand & stand pivot) Locomotion  Gait Ambulation: Yes Gait Assistance: Minimal Assistance - Patient > 75%;Contact Guard/Touching assist (requires frequent standing breaks to "reset" and reorient to midline (decreasing R lean) in order to reach this distance at this assist level) Gait Distance (Feet): 125 Feet Assistive device: Rolling walker Gait Assistance Details: Verbal cues for safe use of DME/AE;Verbal cues for precautions/safety;Verbal cues for technique;Verbal cues for gait pattern;Tactile cues for posture;Tactile cues for sequencing;Tactile cues for initiation;Tactile cues for weight shifting;Manual facilitation for weight shifting Gait Gait: Yes Gait Pattern: Impaired Gait Pattern: Wide base of support;Lateral trunk lean to right;Decreased step length - left;Decreased step length - right;Decreased stride length Gait velocity: significantly decreased Stairs / Additional Locomotion Stairs: Yes Stairs  Assistance: Minimal Assistance - Patient > 75% Stair Management Technique: One rail Left;Step to pattern;Sideways Number of Stairs: 8 Height of Stairs: 6 Wheelchair Mobility Wheelchair Mobility: Yes Wheelchair Assistance: Chartered loss adjuster: Both upper extremities;Both lower extermities Wheelchair Parts Management: Needs assistance Distance: 20f  Trunk/Postural Assessment  Cervical Assessment Cervical Assessment: Within Functional Limits Thoracic Assessment Thoracic Assessment: Within Functional Limits Lumbar Assessment Lumbar Assessment: Within Functional Limits Postural Control Postural Control: Deficits on evaluation Trunk Control: now able to maintain trunk control in sitting with supervision Postural Limitations: continues to have postural limitations in standing due to continued truncal ataxia; however, improved since initial evaluation  Balance  Balance Balance Assessed: Yes Static Sitting Balance Static Sitting - Balance Support: Feet supported Static Sitting - Level of Assistance: 5: Stand by assistance Dynamic Sitting Balance Dynamic Sitting - Balance Support: During functional activity Dynamic Sitting - Level of Assistance: Other (comment) (CGA) Static Standing Balance Static Standing - Balance Support: During functional activity;Bilateral upper extremity supported Static Standing - Level of Assistance: Other (comment) (CGA) Dynamic Standing Balance Dynamic Standing - Balance Support: During functional activity;Bilateral upper extremity supported Dynamic Standing - Level of Assistance: 4: Min assist;3: Mod assist Extremity Assessment      RLE Assessment RLE Assessment: Within Functional Limits Active Range of Motion (AROM) Comments: WFL General Strength Comments: impaired force regulation due to ataxia but strength WFL LLE Assessment LLE Assessment: Within Functional Limits Active Range of Motion (AROM) Comments: WFL    Tawana Scale , PT, DPT, NCS, CSRS  09/26/2021, 12:15 PM

## 2021-09-26 NOTE — Progress Notes (Signed)
Patient ID: Maurice Little, male   DOB: 06-25-1956, 65 y.o.   MRN: [id number]  Due to the patient's urinary retention and anatomy, he is unable to pass a straight tip catheter and will need to perform cic with a 16 FR coude' tip catheter up to 4x daily indefinitely.

## 2021-09-26 NOTE — Progress Notes (Signed)
Patient is A&O x 4 and able to make his needs known. Patient and wife [name] continue to be educated on straight catheretization. Patient and wife worked together as a team and were able to straight cath for 600 ml clear yellow urine without difficulty. Patient and wife feel confident that they will be able to continue straight cath at home without difficulty. Writer will educate/assist again this evening. Call light and personal items all within reach.

## 2021-09-26 NOTE — Progress Notes (Signed)
Inpatient Rehabilitation Care Coordinator Discharge Note   Patient Details  Name: Maurice Little MRN: [id number] Date of Birth: 08/16/1956   Discharge location: Home  Length of Stay: 22 Days  Discharge activity level: CGA/Min  Home/community participation: Spouse  Patient response CZ:YSAYTK Literacy - How often do you need to have someone help you when you read instructions, pamphlets, or other written material from your doctor or pharmacy?: Sometimes  Patient response ZS:WFUXNA Isolation - How often do you feel lonely or isolated from those around you?: Never  Services provided included: MD, RD, PT, OT, SLP, RN, CM, TR, Pharmacy, SW  Financial Services:  Financial Services Utilized: Private Insurance [facility]  Choices offered to/list presented to: patient and spouse  Follow-up services arranged:  Outpatient    Outpatient Servicies: Neuro OP      Patient response to transportation need: Is the patient able to respond to transportation needs?: Yes In the past 12 months, has lack of transportation kept you from medical appointments or from getting medications?: No In the past 12 months, has lack of transportation kept you from meetings, work, or from getting things needed for daily living?: No    Comments (or additional information):  Patient/Family verbalized understanding of follow-up arrangements:  Yes  Individual responsible for coordination of the follow-up plan: Jodie  Confirmed correct DME delivered: Andria Rhein 09/26/2021    Andria Rhein

## 2021-09-27 DIAGNOSIS — R339 Retention of urine, unspecified: Secondary | ICD-10-CM

## 2021-09-27 DIAGNOSIS — H532 Diplopia: Secondary | ICD-10-CM

## 2021-09-27 DIAGNOSIS — I6521 Occlusion and stenosis of right carotid artery: Secondary | ICD-10-CM

## 2021-09-27 DIAGNOSIS — D649 Anemia, unspecified: Secondary | ICD-10-CM

## 2021-09-27 MED ORDER — TAMSULOSIN HCL 0.4 MG PO CAPS: 0.4000 mg | ORAL_CAPSULE | Freq: Every day | ORAL | 0 refills | Status: DC

## 2021-09-27 MED ORDER — NAPHAZOLINE-GLYCERIN 0.012-0.25 % OP SOLN: 1.0000 [drp] | Freq: Three times a day (TID) | OPHTHALMIC | 1 refills | Status: DC

## 2021-09-27 MED ORDER — POLYETHYLENE GLYCOL 3350 17 G PO PACK: 17.0000 g | PACK | Freq: Two times a day (BID) | ORAL | 0 refills | Status: DC

## 2021-09-27 MED ORDER — AMLODIPINE BESYLATE 5 MG PO TABS: 5.0000 mg | ORAL_TABLET | Freq: Every day | ORAL | 0 refills | Status: DC

## 2021-09-27 MED ORDER — TICAGRELOR 90 MG PO TABS: 90.0000 mg | ORAL_TABLET | Freq: Two times a day (BID) | ORAL | 1 refills | Status: DC

## 2021-09-27 MED ORDER — HYDRALAZINE HCL 10 MG PO TABS: 10.0000 mg | ORAL_TABLET | Freq: Three times a day (TID) | ORAL | 0 refills | Status: DC

## 2021-09-27 MED ORDER — ATORVASTATIN CALCIUM 80 MG PO TABS: 80.0000 mg | ORAL_TABLET | Freq: Every day | ORAL | 0 refills | Status: DC

## 2021-09-27 MED ORDER — LOSARTAN POTASSIUM 25 MG PO TABS: 25.0000 mg | ORAL_TABLET | Freq: Every day | ORAL | 0 refills | Status: DC

## 2021-09-27 MED ORDER — PANTOPRAZOLE SODIUM 40 MG PO TBEC: 40.0000 mg | DELAYED_RELEASE_TABLET | Freq: Every day | ORAL | 0 refills | Status: DC

## 2021-09-27 MED ORDER — SENNOSIDES-DOCUSATE SODIUM 8.6-50 MG PO TABS: 2.0000 | ORAL_TABLET | Freq: Two times a day (BID) | ORAL | 0 refills | Status: AC

## 2021-09-27 MED ORDER — HYDROCODONE-ACETAMINOPHEN 7.5-325 MG PO TABS: 1.0000 | ORAL_TABLET | Freq: Every day | ORAL | 0 refills | Status: DC | PRN

## 2021-09-27 MED ORDER — ARTIFICIAL TEARS OPHTHALMIC OINT: TOPICAL_OINTMENT | Freq: Every day | OPHTHALMIC | 0 refills | Status: DC

## 2021-09-27 MED ORDER — ASPIRIN EC 81 MG PO TBEC: 81.0000 mg | DELAYED_RELEASE_TABLET | Freq: Every day | ORAL | 11 refills | Status: AC

## 2021-09-27 MED ORDER — ROSUVASTATIN CALCIUM 40 MG PO TABS: 40.0000 mg | ORAL_TABLET | Freq: Every day | ORAL | 0 refills | Status: DC

## 2021-09-27 MED ORDER — CITALOPRAM HYDROBROMIDE 10 MG PO TABS: 10.0000 mg | ORAL_TABLET | Freq: Every day | ORAL | 0 refills | Status: DC

## 2021-09-27 NOTE — Discharge Summary (Signed)
Physician Discharge Summary  Patient ID: Maurice Little MRN: [id number] DOB/AGE: Dec 13, 1955 65 y.o.  Admit date: 09/04/2021 Discharge date: 09/27/2021  Discharge Diagnt7oses:  Principal Problem:   Cerebellar stroke [facility]) Active Problems:   Dyslipidemia   Slow transit constipation   Benign essential HTN   Urinary retention   Diplopia   Carotid stenosis, right   Anemia   Discharged Condition: good  Significant Diagnostic Studies: CT ABDOMEN PELVIS WO CONTRAST  Result Date: 09/19/2021 CLINICAL DATA:  Hematuria, inguinal pain EXAM: CT ABDOMEN AND PELVIS WITHOUT CONTRAST TECHNIQUE: Multidetector CT imaging of the abdomen and pelvis was performed following the standard protocol without IV contrast. Unenhanced CT was performed per clinician order. Lack of IV contrast limits sensitivity and specificity, especially for evaluation of abdominal/pelvic solid viscera. COMPARISON:  None. FINDINGS: Lower chest: No acute pleural or parenchymal lung disease. Hepatobiliary: Unremarkable unenhanced appearance of the liver and gallbladder. Pancreas: Unremarkable. No pancreatic ductal dilatation or surrounding inflammatory changes. Spleen: Normal in size without focal abnormality. Adrenals/Urinary Tract: No urinary tract calculi or obstructive uropathy. The adrenals are unremarkable. The bladder is decompressed with a Foley catheter. Stomach/Bowel: No bowel obstruction or ileus. Normal appendix right lower quadrant. No bowel wall thickening or inflammatory change. Vascular/Lymphatic: Aortic atherosclerosis. No enlarged abdominal or pelvic lymph nodes. Reproductive: Mild enlargement of the prostate measuring 6 point catch that mild prostate enlargement measuring 5.7 x 4.0 cm. Other: No free fluid or free gas.  No abdominal wall hernia. Musculoskeletal: No acute or destructive bony lesions. Reconstructed images demonstrate no additional findings. IMPRESSION: 1. No evidence of urinary tract calculi or obstructive  uropathy. 2. Normal appendix. 3. Mild enlargement of the prostate. 4.  Aortic Atherosclerosis (ICD10-I70.0). Electronically Signed   By: Randa Ngo M.D.   On: 09/19/2021 19:37   DG Chest 2 View  Result Date: 09/06/2021 CLINICAL DATA:  Leukocytosis EXAM: CHEST - 2 VIEW COMPARISON:  None. FINDINGS: Heart size and mediastinal contours are within normal limits. No suspicious pulmonary opacities identified. No pleural effusion or pneumothorax visualized. No acute osseous abnormality appreciated. IMPRESSION: No acute intrathoracic process identified. Electronically Signed   By: Ofilia Neas M.D.   On: 09/06/2021 09:31   CT HEAD WO CONTRAST (5MM)  Result Date: 09/05/2021 CLINICAL DATA:  Stroke, follow-up, new vision changes EXAM: CT HEAD WITHOUT CONTRAST TECHNIQUE: Contiguous axial images were obtained from the base of the skull through the vertex without intravenous contrast. COMPARISON:  08/29/2021 CT head, correlation is also made with 09/01/2021 MRI brain FINDINGS: Brain: Increased hypodensity in the right cerebellar hemisphere and pons compared to 08/29/2021, which largely correlates with the restricted diffusion on the 09/01/2021 exam. Additional focal area of hypodensity in the medulla (series 3, image 5 and series 6, image 31) does not correspond to an area of restricted diffusion on the prior MRI. No hemorrhage, mass, mass effect, or midline shift. Vascular: Interval stenting of the basilar artery. Skull: No acute osseous abnormality. Sinuses/Orbits: Mild mucosal thickening in the maxillary sinuses. Other: Trace fluid in right mastoid air cells. IMPRESSION: 1. Increased hypodensity in the right cerebellar hemisphere and pons, which largely correlates with an area of restricted diffusion, with an additional focal area of hypodensity in the medulla, which does not correspond to an area of restricted diffusion on the prior MRI and is concerning for additional area of infarction. 2. Interval stenting  of the basilar artery. These results were called by telephone at the time of interpretation on 09/05/2021 at 5:47 pm to provider Maurice Little ,  who verbally acknowledged these results. Electronically Signed   By: Wiliam Ke M.D.   On: 09/05/2021 17:48   ECHO TEE  Result Date: 09/05/2021    TRANSESOPHOGEAL ECHO REPORT   Patient Name:   Maurice Little Date of Exam: 08/31/2021 Medical Rec #:  [id number]  Height:       72.0 in Accession #:    [id number] Weight:       202.8 lb Date of Birth:  [birth year]   BSA:          2.143 m Patient Age:    65 years   BP:           127/74 mmHg Patient Gender: M          HR:           97 bpm. Exam Location:  Inpatient Procedure: Cardiac Doppler, Color Doppler, Transesophageal Echo and Saline            Contrast Bubble Study Indications:     Stroke  History:         Patient has prior history of Echocardiogram examinations, most                  recent 08/30/2021.  Sonographer:     Roosvelt Maser RDCS Referring Phys:  [id number] RHONDA G BARRETT Diagnosing Phys: Chilton Si MD PROCEDURE: After discussion of the risks and benefits of a TEE, an informed consent was obtained from the patient. The transesophogeal probe was passed without difficulty through the esophogus of the patient. Local oropharyngeal anesthetic was provided with Cetacaine. Sedation performed by different physician. The patient was monitored while under deep sedation. The patient's vital signs; including heart rate, blood pressure, and oxygen saturation; remained stable throughout the procedure. The patient developed no complications during the procedure. IMPRESSIONS  1. Left ventricular ejection fraction, by estimation, is 55 to 60%. The left ventricle has normal function. The left ventricle has no regional wall motion abnormalities.  2. Right ventricular systolic function is normal. The right ventricular size is normal.  3. No left atrial/left atrial appendage thrombus was detected.  4. The mitral valve is normal in structure.  Trivial mitral valve regurgitation. No evidence of mitral stenosis.  5. The aortic valve is tricuspid. Aortic valve regurgitation is not visualized. No aortic stenosis is present.  6. 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  7. The inferior vena cava is normal in size with greater than 50% respiratory variability, suggesting right atrial pressure of 3 mmHg.  8. Agitated saline contrast bubble study was positive with shunting observed within 3-6 cardiac cycles suggestive of interatrial shunt. There is a small patent foramen ovale with predominantly right to left shunting across the atrial septum. Conclusion(s)/Recommendation(s): Normal biventricular function without evidence of hemodynamically significant valvular heart disease. FINDINGS  Left Ventricle: Left ventricular ejection fraction, by estimation, is 55 to 60%. The left ventricle has normal function. The left ventricle has no regional wall motion abnormalities. The left ventricular internal cavity size was normal in size. There is  no left ventricular hypertrophy. Right Ventricle: The right ventricular size is normal. No increase in right ventricular wall thickness. Right ventricular systolic function is normal. Left Atrium: Left atrial size was normal in size. No left atrial/left atrial appendage thrombus was detected. Right Atrium: Right atrial size was normal in size. Pericardium: There is no evidence of pericardial effusion. Mitral Valve: The mitral valve is normal in structure. Trivial mitral valve regurgitation. No evidence of  mitral valve stenosis. Tricuspid Valve: The tricuspid valve is normal in structure. Tricuspid valve regurgitation is not demonstrated. No evidence of tricuspid stenosis. Aortic Valve: The aortic valve is tricuspid. Aortic valve regurgitation is not visualized. No aortic stenosis is present. Pulmonic Valve: The pulmonic valve was normal in structure. Pulmonic valve regurgitation is trivial. No  evidence of pulmonic stenosis. Aorta: The aortic root is normal in size and structure. There is moderate (Grade III) atheroma plaque involving the descending aorta. Venous: The inferior vena cava is normal in size with greater than 50% respiratory variability, suggesting right atrial pressure of 3 mmHg. IAS/Shunts: No atrial level shunt detected by color flow Doppler. Agitated saline contrast was given intravenously to evaluate for intracardiac shunting. Agitated saline contrast bubble study was positive with shunting observed within 3-6 cardiac cycles suggestive of interatrial shunt. A small patent foramen ovale is detected with predominantly right to left shunting across the atrial septum. Skeet Latch MD Electronically signed by Skeet Latch MD Signature Date/Time: 09/05/2021/11:24:08 AM    Final    CT HEAD CODE STROKE WO CONTRAST  Result Date: 09/08/2021 CLINICAL DATA:  Initial evaluation for neuro deficit, stroke suspected. History of recent stroke, worsening symptoms. EXAM: CT HEAD WITHOUT CONTRAST CT ANGIOGRAPHY HEAD AND NECK TECHNIQUE: Multidetector CT imaging of the head and neck was performed using the standard protocol during bolus administration of intravenous contrast. Multiplanar CT image reconstructions and MIPs were obtained to evaluate the vascular anatomy. Carotid stenosis measurements (when applicable) are obtained utilizing NASCET criteria, using the distal internal carotid diameter as the denominator. CONTRAST:  75mL ISOVUE-370 IOPAMIDOL (ISOVUE-370) INJECTION 76% COMPARISON:  Recent studies from 09/05/2021 and 09/01/2021 FINDINGS: CT HEAD FINDINGS Brain: Continued interval evolution of right cerebellar and posterior pontine infarcts, overall similar in size and distribution as compared to previous. Associated edema has decreased from prior. No evidence for hemorrhagic transformation or other complication. No other acute large vessel territory infarct. No intracranial hemorrhage. No  mass lesion or midline shift. No hydrocephalus or extra-axial fluid collection. Vascular: Vascular stent in place within the basilar artery again noted. No hyperdense vessel. Skull: Scalp soft tissues and calvarium demonstrate no acute finding. Sinuses/Orbits: Globes orbital soft tissues within normal limits. Scattered mucosal thickening noted within the ethmoidal air cells and maxillary sinuses. Mastoid air cells remain clear. Other: None. ASPECTS (Dodson Stroke Program Early CT Score) - Ganglionic level infarction (caudate, lentiform nuclei, internal capsule, insula, M1-M3 cortex): 7 - Supraganglionic infarction (M4-M6 cortex): 3 Total score (0-10 with 10 being normal): 10 CTA NECK FINDINGS Aortic arch: Visualized aortic arch normal in caliber with normal branch pattern. No stenosis about the origin the great vessels. Right carotid system: Right CCA remains widely patent to the bifurcation. Atheromatous plaque at right carotid bulb with associated 65% stenosis, stable. Right ICA remains widely patent distally. Left carotid system: Left CCA remains patent to the bifurcation without stenosis. Atheromatous plaque at the left carotid bulb with associated 50% stenosis, stable. Left ICA remains patent distally. Vertebral arteries: Both vertebral arteries arise from subclavian arteries. Interval stenting at the origin/proximal right vertebral artery. Patent flow through the stent with good perfusion distally. Left vertebral artery remains patent within the neck. Skeleton: Stable.  No discrete or worrisome osseous lesions. Other neck: No other acute soft tissue abnormality within the neck. Upper chest: Scattered interlobular septal thickening within the visualized lungs, consistent with pulmonary interstitial congestion/edema. Review of the MIP images confirms the above findings CTA HEAD FINDINGS Anterior circulation: Petrous segments patent. Scattered atheromatous  change within the carotid siphons without high-grade  stenosis, stable. A1 segments remain patent. Normal anterior communicating artery complex. Anterior cerebral arteries remain patent. No M1 stenosis or occlusion. Normal MCA bifurcations. Distal MCA branches remain perfused and symmetric. Posterior circulation: Dominant right vertebral artery widely patent. Right PICA not visualized. Hypoplastic left vertebral artery terminates in PICA. Left PICA remains patent. Vascular stent in place within the basilar artery. Patent flow through the stent with good perfusion distally. Superior cerebral arteries patent bilaterally. Both PCAs well perfused a remain widely patent to their distal aspects. Venous sinuses: Patent allowing for timing the contrast bolus. Anatomic variants: Hypoplastic left vertebral artery terminates in PICA. Review of the MIP images confirms the above findings IMPRESSION: CT HEAD IMPRESSION: 1. Continued interval evolution of right cerebellar and posterior pontine infarcts, overall similar in size and distribution as compared to previous. No evidence for hemorrhagic transformation or other complication. 2. No other new acute intracranial abnormality. CTA HEAD AND NECK IMPRESSION: 1. Interval stenting at the origin/proximal right vertebral artery as well as the basilar artery. Patent flow through the stents with good perfusion distally. 2. Otherwise stable CTA of the head and neck. No large vessel occlusion. 3. 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 4. Findings consistent with pulmonary interstitial congestion/edema. Results were called by telephone at the time of interpretation on 09/08/2021 at 9:45 p.m. to provider [facility] , who verbally acknowledged these results. Electronically Signed   By: Jeannine Boga M.D.   On: 09/08/2021 23:15   CT HEAD CODE STROKE WO CONTRAST   Labs:  Basic Metabolic Panel: BMP Latest Ref Rng & Units 09/24/2021 09/17/2021 09/11/2021  Glucose 70 - 99 mg/dL 97 110(H) 106(H)  BUN 8  - 23 mg/dL 15 15 15   Creatinine 0.61 - 1.24 mg/dL 1.09 0.91 0.80  Sodium 135 - 145 mmol/L 135 133(L) 135  Potassium 3.5 - 5.1 mmol/L 4.5 4.1 4.0  Chloride 98 - 111 mmol/L 101 101 103  CO2 22 - 32 mmol/L 27 25 21(L)  Calcium 8.9 - 10.3 mg/dL 9.0 9.1 8.8(L)     CBC: CBC Latest Ref Rng & Units 09/24/2021 09/17/2021 09/11/2021  WBC 4.0 - 10.5 K/uL 7.4 7.5 8.3  Hemoglobin 13.0 - 17.0 g/dL 10.9(L) 12.5(L) 12.9(L)  Hematocrit 39.0 - 52.0 % 32.5(L) 36.2(L) 37.5(L)  Platelets 150 - 400 K/uL 311 312 277     CBG: No results for input(s): GLUCAP in the last 168 hours.  Brief HPI:   Maurice Little is a 64 y.o. male with history of HTN, PAD was admitted on 08/29/2021 with onset of dizziness and headaches that had started the day before and CT head done showing subacute right cerebellar infarct.  CTA head/neck done showing atherosclerosis with question of high-grade stenosis or nonocclusive clot in basilar artery.  He was loaded with DAPT and started on IV heparin.  He developed right facial droop with right hearing loss, diplopia with nystagmus as well as slurred speech with nausea the evening past admission.  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 Estanislado Pandy.    He was also found to have severe 90% stenosis proximal right ICA and 50% stenosis proximal left-ICA..  TEE done showing EF 60 to 65% with PFO and right-to-left shunt.  MRI brain showed acute infarct right cerebellar hemisphere and small additional infarcts right brachium pontis and right pontomedullary junction.  Dr. Leonie Man felt that stroke was most  likely secondary to atherosclerosis with R-VA occlusion and BA high grade stenosis.   He recommended 30-day cardiac event monitor at discharge and Dr. Estanislado Pandy plans on right ICA stenting in 6 weeks.  Patient has had issues with urinary retention requiring Foley placement, diplopia, mild dysphagia, balance deficits  with RLE incoordination, left-sided numbness as well as orthostatic changes with activity.  CIR was recommended due to functional decline.   Hospital Course: Maurice Little was admitted to rehab 09/04/2021 for inpatient therapies to consist of PT, ST and OT at least three hours five days a week. Past admission physiatrist, therapy team and rehab RN have worked together to provide customized collaborative inpatient rehab. His blood pressures were monitored on TID basis and he was noted to be hypotensive at admission and reported worsening of hearing as well as vision on day past admission. CT head was repeated showing new medullary stroke on follow up exam. Neurology was consulted for input and felt that new density of unclear significance as patient without new symptoms compatible with medullary infarct. TEDs, abdominal binder and Midodrine was added briefly to help with BP support. With increase in activity, BP started trending up therefore midodrine was d/c and Cozaar was added for better control.   He continues on ASA/Brilinta and follow up CBC showed mild drop in H/H without signs of bleeding. Recommend follow up CBC in a couple of weeks to monitor H/H.  Serial check of BMET showed renal status to be stable and transient hyponatremia has resolved. Follow up lytes also showed that stress induced hyperglycemia has resolved with increase in activity.  He was started on bowel program to help manage constipation and foley was discontinued on 11/09. He continued to have urinary retention requiring I/O caths at least qid with use of coude catheter due to anatomy. He did develop hematuria due to traumatic caths. CT abdomen done was negative for calculi or acute abnormality.  Flomax was increased to 0.8 mg and foley was replaced for bladder rest. UA/UCS done and was negative for infection. He was unable to tolerate increase in dose due to orthostatic drop in BP and dose was decreased to 0.4 mg.   He continues to  require I/O caths due to neurogenic bladder and is to follow up with urology for further work up.  His po intake has improved. Team has provided ego support and celexa was added to help with adjustment reaction. Eye drops added qid with Lacrilube at bedtime to help manage eye irritation from lid lag.  Constipation has resolved and he is continent of bowel. Diplopia has greatly improved. He has made steady gains and requires contact-guard to min assist.  He will continue to receive outpatient PT and OT at [facility] neuro rehab after discharge.   Rehab course: During patient's stay in rehab weekly team conferences were held to monitor patient's progress, set goals and discuss barriers to discharge. At admission, patient required max assist with ADL tasks and with mobility. He presented with mild to moderate dysarthria with echolalia and noted to have coughing episodes with straws therefore latter was discontinued. He  has had improvement in activity tolerance, balance, postural control as well as ability to compensate for deficits. He has had improvement in functional use RUE  and RLE as well as improvement in awareness. He is able to complete ADL tasks with CGA and occasional cues for safety. He requires CGA for transfers and is able to ambulate 15' with +2 min assist. Speech intelligibility has improved to  100% and he is tolerating advancement to regular diet with liquids via straw/cup without s/s of aspiration. ST signed off by 11/16.  Family education was completed with wife.     Discharge disposition: 01-Home or Self Care  Diet: Heart Healthy.   Special Instructions: Recommend CBC in 1-2 couple of weeks to monitor H/H.    Discharge Instructions     Ambulatory referral to Cardiology   Complete by: As directed    Needs 30 day event monitor for stroke work up   Ambulatory referral to Interventional Radiology   Complete by: As directed    S/p BA stent and needs follow up on L-ICA stenosis    Ambulatory referral to Neurology   Complete by: As directed    An appointment is requested in approximately: 3 weeks/stroke follow up   Ambulatory referral to Physical Medicine Rehab   Complete by: As directed    Stroke follow up      Allergies as of 09/27/2021       Reactions   Duloxetine Tinitus   Other reaction(s): Other (See Comments) Dizziness Dizziness, tinnitus        Medication List     STOP taking these medications    ibuprofen 200 MG tablet Commonly known as: ADVIL   NAC PO   OVER THE COUNTER MEDICATION       TAKE these medications    acetaminophen 325 MG tablet Commonly known as: TYLENOL Take 1-2 tablets (325-650 mg total) by mouth every 4 (four) hours as needed for mild pain.   amLODipine 5 MG tablet Commonly known as: NORVASC Take 1 tablet (5 mg total) by mouth daily after supper.   artificial tears Oint ophthalmic ointment Commonly known as: LACRILUBE Place into both eyes at bedtime.   aspirin EC 81 MG tablet Take 1 tablet (81 mg total) by mouth daily. Swallow whole.   B-12 PO Take 1 tablet by mouth daily.   citalopram 10 MG tablet Commonly known as: CELEXA Take 1 tablet (10 mg total) by mouth daily.   hydrALAZINE 10 MG tablet Commonly known as: APRESOLINE Take 1 tablet (10 mg total) by mouth every 8 (eight) hours.   HYDROcodone-acetaminophen 7.5-325 MG tablet Commonly known as: NORCO Take 1 tablet by mouth daily as needed for severe pain.   losartan 25 MG tablet Commonly known as: COZAAR Take 1 tablet (25 mg total) by mouth daily.   naphazoline-glycerin 0.012-0.25 % Soln Commonly known as: CLEAR EYES REDNESS Place 1-2 drops into the right eye 4 (four) times daily -  with meals and at bedtime.   pantoprazole 40 MG tablet Commonly known as: PROTONIX Take 1 tablet (40 mg total) by mouth daily.   polyethylene glycol 17 g packet Commonly known as: MIRALAX / GLYCOLAX Take 17 g by mouth 2 (two) times daily.   rosuvastatin 40 MG  tablet Commonly known as: Crestor Take 1 tablet (40 mg total) by mouth daily.   senna-docusate 8.6-50 MG tablet Commonly known as: Senokot-S Take 2 tablets by mouth 2 (two) times daily.   tamsulosin 0.4 MG Caps capsule Commonly known as: FLOMAX Take 1 capsule (0.4 mg total) by mouth daily after supper.   ticagrelor 90 MG Tabs tablet Commonly known as: BRILINTA Take 1 tablet (90 mg total) by mouth 2 (two) times daily.        Follow-up Information     Hermina Staggers, MD. Call.   Specialty: Family Medicine Why: for post hospital follow up Contact information: [place]  [address] [place] [id number] [phone]         Charlett Blake, MD Follow up.   Specialty: Physical Medicine and Rehabilitation Why: office will call you with follow up appointment Contact information: [place] [address] [phone]         [facility] Follow up.   Why: office will call you with appointment Contact information: [address]     [address] [place] [phone]        Luanne Bras, MD Follow up.   Specialties: Interventional Radiology, Radiology Why: Office will call you with appointment--call if you have not heard from them in a few weeks. Contact information: [address] [place] [address] [id number]         Festus Aloe, MD Follow up on 11/21/2021.   Specialty: Urology Why: Be there at 10:45 am for 11 am appointment Contact information: [place] [address] [phone]                 Signed: Bary Leriche 09/30/2021, 10:33 PM

## 2021-09-27 NOTE — Plan of Care (Signed)
  Problem: RH Balance Goal: LTG: Patient will maintain dynamic sitting balance (OT) Description: LTG:  Patient will maintain dynamic sitting balance with assistance during activities of daily living (OT) 09/27/2021 1455 by Howerton-Davis, Jaan Fischel R, OT/L Outcome: Not Met (add Reason) 09/27/2021 1454 by Howerton-Davis, Octa Uplinger R, OT/L Note: Continues to be limited by ataxia and balance deficits requiring intermittent Min guard 09/27/2021 1454 by Howerton-Davis, Caddie Randle R, OT/L Outcome: Not Met (add Reason)

## 2021-09-27 NOTE — Progress Notes (Signed)
PROGRESS NOTE   Subjective/Complaints: Did well with self cath instruction per RN ROS:  +Left sided numbness. Denies CP, SOB, N/V/D, no more bladder spasms   Objective:   No results found. No results for input(s): WBC, HGB, HCT, PLT in the last 72 hours.   No results for input(s): NA, K, CL, CO2, GLUCOSE, BUN, CREATININE, CALCIUM in the last 72 hours.    Intake/Output Summary (Last 24 hours) at 09/27/2021 0831 Last data filed at 09/27/2021 0740 Gross per 24 hour  Intake 720 ml  Output 2100 ml  Net -1380 ml         Physical Exam: Vital Signs Blood pressure 121/82, pulse 75, temperature 98.5 F (36.9 C), resp. rate 17, height 6' (1.829 m), weight 94.9 kg, SpO2 98 %.   General: No acute distress Mood and affect are appropriate Heart: Regular rate and rhythm no rubs murmurs or extra sounds Lungs: Clear to auscultation, breathing unlabored, no rales or wheezes Abdomen: Positive bowel sounds, soft nontender to palpation, nondistended Extremities: No clubbing, cyanosis, or edema Skin: No evidence of breakdown, no evidence of rash    Neuro: Alert Motor: 5/5 bilateral delt, bi, tri, grip, HF, KE ADF  Right facial weakness , diplopia with RIght lateral gaze  Dysarthria  Mild LUE ataxia Mild RUE dysmetria  Assessment/Plan: 1. Functional deficits due to RIght tbrainstem, cerebellar infarcts  Stable for D/C today F/u PCP in 3-4 weeks F/u PM&R 4 weeks F/u urology 2wk F/u Neuro 1-2 mo  F/u IR - Dr Corliss Skains 4-6 wks  See D/C summary See D/C instructions  Care Tool:  Bathing    Body parts bathed by patient: Right arm, Left arm, Chest, Abdomen, Right upper leg, Left upper leg, Right lower leg, Left lower leg, Face, Front perineal area, Buttocks   Body parts bathed by helper: Front perineal area, Buttocks     Bathing assist Assist Level: Contact Guard/Touching assist     Upper Body Dressing/Undressing Upper  body dressing   What is the patient wearing?: Pull over shirt    Upper body assist Assist Level: Set up assist    Lower Body Dressing/Undressing Lower body dressing      What is the patient wearing?: Pants     Lower body assist Assist for lower body dressing: Minimal Assistance - Patient > 75%     Toileting Toileting Toileting Activity did not occur (Clothing management and hygiene only): N/A (no void or bm) (foley in place)  Toileting assist Assist for toileting: Moderate Assistance - Patient 50 - 74%     Transfers Chair/bed transfer  Transfers assist     Chair/bed transfer assist level: Contact Guard/Touching assist (squat pivot)     Locomotion Ambulation   Ambulation assist   Ambulation activity did not occur: Safety/medical concerns (requires +2 and skilled assist with use of hallway rail)  Assist level: Minimal Assistance - Patient > 75% Assistive device: Walker-rolling Max distance: 27ft   Walk 10 feet activity   Assist  Walk 10 feet activity did not occur: Safety/medical concerns  Assist level: Minimal Assistance - Patient > 75% Assistive device: Walker-rolling   Walk 50 feet activity   Assist Walk 50  feet with 2 turns activity did not occur: Safety/medical concerns  Assist level: Minimal Assistance - Patient > 75% Assistive device: Walker-rolling    Walk 150 feet activity   Assist Walk 150 feet activity did not occur: Safety/medical concerns  Assist level: Moderate Assistance - Patient - 50 - 74% Assistive device: Walker-rolling    Walk 10 feet on uneven surface  activity   Assist Walk 10 feet on uneven surfaces activity did not occur: Safety/medical concerns   Assist level: Moderate Assistance - Patient - 50 - 74% Assistive device: Photographer Is the patient using a wheelchair?: Yes Type of Wheelchair: Manual Wheelchair activity did not occur: Safety/medical concerns  Wheelchair assist level:  Supervision/Verbal cueing Max wheelchair distance: 71ft    Wheelchair 50 feet with 2 turns activity    Assist        Assist Level: Minimal Assistance - Patient > 75%   Wheelchair 150 feet activity     Assist      Assist Level: Maximal Assistance - Patient 25 - 49%   Blood pressure 121/82, pulse 75, temperature 98.5 F (36.9 C), resp. rate 17, height 6' (1.829 m), weight 94.9 kg, SpO2 98 %.    Medical Problem List and Plan: 1.  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  Impaired mobility: d/c lovenox              -antiplatelet therapy: ASA/Brilinta. 3. Pain Management: Tylenol prn for HA.  Occ facial shooting pains , likely CVA related as R CN 6 and 7 involved, may have CN 5 involvement as well, if this worsens may need gabapentin or topiramate 4. Mood: LCSW to follow for evaluation and support.              -antipsychotic agents: N/A 5. Neuropsych: This patient is capable of making decisions on his own behalf. 6. Skin/Wound Care: Routine pressure-relief measures. 7. Fluids/Electrolytes/Nutrition: Monitor I's/Os 8.  HTN with new hypotension/orthostaitc: Monitor BP TID.   Decreased Norvasc to 5 mg daily TEDs and ACE wraps and see how pt does.  Decreased hydralazine to 10 mg TID   Vitals:   09/26/21 2003 09/27/21 0449  BP: 115/71 121/82  Pulse: 72 75  Resp: 17 17  Temp: 98.5 F (36.9 C) 98.5 F (36.9 C)  SpO2: 97% 98%   Flomax increased Losartan 25 daily started on 11/20, continue 9.  Urinary retention: may have pontine micturition center involvement +/- BPH,  Continue Flomax, can consider increasing to 0.8mg  if still having difficulty after foley removal on Monday Cont voiding trial I/O caths at home with urology f/u  10. Constipation: increased Senna  S 2 po BID .   Miralax scheduled  Enema ordered per family request 11.  Leukocytosis: Resolved 12.  Intermittent hypokalemia: Resolved 13.  Hyperglycemia: Likely stress related as Hemoglobin A1c 5.5. 14.  Dyslipidemia: Continue Lipitor 15. 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 16. Unable to close right eye lid. Peripheral 7 Right - c/o "film over R eye" which is normal with lacrilube  -ordered eye patch right eye  -eye drops. May keep at bedside     LOS: 23 days A FACE TO FACE EVALUATION WAS PERFORMED  Maurice Little 09/27/2021, 8:31 AM

## 2021-09-27 NOTE — Progress Notes (Signed)
Patient discharged via wheelchair by staff with wife by side. Patient and spouse voices understanding of discharge instructions

## 2021-09-27 NOTE — Plan of Care (Deleted)
  Problem: RH Balance Goal: LTG: Patient will maintain dynamic sitting balance (OT) Description: LTG:  Patient will maintain dynamic sitting balance with assistance during activities of daily living (OT) 09/27/2021 1454 by Howerton-Davis, Tildon Silveria R, OT/L Note: Continues to be limited by ataxia and balance deficits requiring intermittent Min guard 09/27/2021 1454 by Howerton-Davis, Jonothan Heberle R, OT/L Outcome: Not Met (add Reason)

## 2021-09-27 NOTE — Progress Notes (Signed)
Occupational Therapy Session Note  Patient Details  Name: Maurice Little MRN: [id number] Date of Birth: May 20, 1956  Today's Date: 09/27/2021 OT Individual Time: 1000-1100 OT Individual Time Calculation (min): 60 min    Short Term Goals: Week 3:  OT Short Term Goal 1 (Week 3): Pt will complete toilet transfers with min assist sit to stand. OT Short Term Goal 2 (Week 3): Pt will maintain dynamic sitting balance at min guard assist during selfcare tasks sitting unsupported. OT Short Term Goal 3 (Week 3): Pt will complete LB dressing with min assist for two consecutive sessions.  Skilled Therapeutic Interventions/Progress Updates:    Pt sitting up in w/c, wife present for further family education.  Pt and wife requesting to shower to get more practice.  Pt completed all functional mobility and self care with wife assisting primarily needing only occasional cues for safety including education on moving towels from walking path, and donning gait belt with clothing underneath to protect skin.  Otherwise all UB/LB bathing and dressing completed at shower level using tub bench, with wife providing CGA overall and also gave appropriate cues as pt needed. Pt able to ambulate using RW from shower bench to EOB with wife providing CGA.  Pt donned pants from sit<>stand with CGA and opted to donn socks in supine with increased time.  Wife demonstrating good safety awareness and ability to assist pt as needed upon dc to home.  Call bell in reach at end of session.   Therapy Documentation Precautions:  Precautions Precautions: Fall Precaution Comments: R side and truncal Ataxic; L side numbness Restrictions Weight Bearing Restrictions: No    Therapy/Group: Individual Therapy  Amie Critchley 09/27/2021, 4:10 PM

## 2021-09-28 ENCOUNTER — Other Ambulatory Visit (HOSPITAL_BASED_OUTPATIENT_CLINIC_OR_DEPARTMENT_OTHER): Payer: Self-pay

## 2021-09-28 MED ORDER — TICAGRELOR 90 MG PO TABS
ORAL_TABLET | ORAL | 0 refills | Status: DC
  Filled 2021-09-28: qty 60, 30d supply, fill #0

## 2021-09-28 MED ORDER — LIDOCAINE HCL URETHRAL/MUCOSAL 2 % EX GEL
CUTANEOUS | 0 refills | Status: DC
  Filled 2021-09-28: qty 30, 2d supply, fill #0

## 2021-10-01 ENCOUNTER — Telehealth: Payer: Self-pay

## 2021-10-01 NOTE — Telephone Encounter (Signed)
Transition Care Management Unsuccessful Follow-up Telephone Call  Date of discharge and from where:  09/27/21 [facility]  Attempts:  1st Attempt  Reason for unsuccessful TCM follow-up call:  Left voice message

## 2021-10-02 ENCOUNTER — Other Ambulatory Visit: Payer: Self-pay

## 2021-10-02 ENCOUNTER — Ambulatory Visit: Payer: 59 | Attending: Physician Assistant | Admitting: Occupational Therapy

## 2021-10-02 ENCOUNTER — Encounter: Payer: Self-pay | Admitting: Occupational Therapy

## 2021-10-02 DIAGNOSIS — R2689 Other abnormalities of gait and mobility: Secondary | ICD-10-CM | POA: Diagnosis present

## 2021-10-02 DIAGNOSIS — R260 Ataxic gait: Secondary | ICD-10-CM | POA: Diagnosis present

## 2021-10-02 DIAGNOSIS — R29818 Other symptoms and signs involving the nervous system: Secondary | ICD-10-CM | POA: Diagnosis present

## 2021-10-02 DIAGNOSIS — R270 Ataxia, unspecified: Secondary | ICD-10-CM | POA: Insufficient documentation

## 2021-10-02 DIAGNOSIS — R2681 Unsteadiness on feet: Secondary | ICD-10-CM | POA: Insufficient documentation

## 2021-10-02 DIAGNOSIS — R208 Other disturbances of skin sensation: Secondary | ICD-10-CM | POA: Diagnosis present

## 2021-10-02 DIAGNOSIS — R278 Other lack of coordination: Secondary | ICD-10-CM | POA: Insufficient documentation

## 2021-10-02 DIAGNOSIS — R41842 Visuospatial deficit: Secondary | ICD-10-CM | POA: Diagnosis present

## 2021-10-02 NOTE — Therapy (Signed)
[facility] [address] [place], [place], [address] Phone: [phone]   Fax:  [phone]  Occupational Therapy Evaluation  Patient Details  Name: Maurice Little MRN: [id number] Date of Birth: 07/04/1956 Referring Provider (OT): Mariam Dollar, PA-C   Encounter Date: 10/02/2021   OT End of Session - 10/02/21 1524     Visit Number 1    Number of Visits 25    Date for OT Re-Evaluation 12/31/21    Authorization Type U[employer]--awaiting insurance verfication    OT Start Time 1018    OT Stop Time 1100    OT Time Calculation (min) 42 min    Activity Tolerance Patient tolerated treatment well    Behavior During Therapy WFL for tasks assessed/performed             Past Medical History:  Diagnosis Date   Benign prostatic hyperplasia with elevated prostate specific antigen (PSA)    Hypertension    Iliac artery stenosis, left (HCC)    with claudication resolved with stent   PAD (peripheral artery disease) (HCC)     Past Surgical History:  Procedure Laterality Date   BUBBLE STUDY  08/31/2021   Procedure: BUBBLE STUDY;  Surgeon: Chilton Si, MD;  Location: [facility] ENDOSCOPY;  Service: Cardiovascular;;   ILIAC ARTERY STENT Left    IR ANGIO VERTEBRAL SEL SUBCLAVIAN INNOMINATE UNI L MOD SED  08/30/2021   IR ANGIO VERTEBRAL SEL SUBCLAVIAN INNOMINATE UNI R MOD SED  08/30/2021   IR CT HEAD LTD  08/30/2021   IR INTRA CRAN STENT  08/30/2021   IR US GUIDE VASC ACCESS RIGHT  08/30/2021   RADIOLOGY WITH ANESTHESIA N/A 08/30/2021   Procedure: IR WITH ANESTHESIA;  Surgeon: Julieanne Cotton, MD;  Location: [facility] OR;  Service: Radiology;  Laterality: N/A;   TEE WITHOUT CARDIOVERSION N/A 08/31/2021   Procedure: TRANSESOPHAGEAL ECHOCARDIOGRAM (TEE);  Surgeon: Chilton Si, MD;  Location: [facility] ENDOSCOPY;  Service: Cardiovascular;  Laterality: N/A;    There were no vitals filed for this visit.   Subjective Assessment - 10/02/21 1022      Subjective  Pt reports doing visual HEP and coordination activities given to pt by hospital    Patient is accompanied by: Family member   [name]   Pertinent History CVA (R cereballar and R small R brachium potis and R pontomedullary junction).   PMH:  dyslipidemia, HTN, carotid stenosis, anemia, PAD    Limitations ataxia, diplopia, folley catheter, decr hearing (particularly R side)    Patient Stated Goals incr independence, get better    Currently in Pain? No/denies               [facility] OT Assessment - 10/02/21 0001       Assessment   Medical Diagnosis CVA    Referring Provider (OT) Mariam Dollar, PA-C    Onset Date/Surgical Date 08/29/21    Hand Dominance Right    Prior Therapy CIR      Precautions   Precautions Fall    Precaution Comments no driving, ataxia, diplopia, decr hearing (particularly R side)      Balance Screen   Has the patient fallen in the past 6 months No      Home  Environment   Family/patient expects to be discharged to: Private residence    Lives With Spouse      Prior Function   Level of Independence Independent    Vocation Full time employment   IT work   Leisure go to  the beach, ride motorcycles, play golf      ADL   Eating/Feeding Modified independent   hasn't attempted opening packages or containers   Grooming Modified independent   for brushing teeth and hair   Upper Body Bathing Supervision/safety    Lower Body Bathing Supervision/safety    Upper Body Dressing Supervision/safety    Lower Body Dressing Minimal assistance   for threading catheter bag through   Toilet Transfer Minimal assistance    Toileting - Clothing Manipulation --   supervision   Toileting -  Hygiene Modified Independent   foley cather   Tub/Shower Transfer Minimal assistance    Tub/Shower Transfer Equipment --   shower seat, shower stall (grab bars installed next week)     IADL   Prior Level of Function Light Housekeeping wife performed most, pt did some,  but not currently    Prior Level of Function Meal Prep wife performed most.  pt did some prior, but none currently.    Prior Level of Function Best boy Relies on family or friends for transportation    Prior Level of Function Financial Management pt performed prio    Financial Management Dependent      Mobility   Mobility Status Comments using RW with assist in the home, w/c in the community      Vision - History   Baseline Vision Bifocals   wears glasses all the time     Vision Assessment   Eye Alignment Impaired (comment)    Ocular Range of Motion Within Functional Limits    Tracking/Visual Pursuits --   decr smoothness of movement, difficulty tracking to R side   Diplopia Assessment Only with right gaze    Comment Pt reports "fuzziness" at midline, with diplopia R of midline (offset).  Pt with difficulty closing R eye all the way/decr blinking and eye appears red today.  Pt reports using drops 4x/day and salve      Cognition   Overall Cognitive Status Within Functional Limits for tasks assessed   pt/wife deny changes--will assess further in functional context prn   Behaviors Poor frustration tolerance   at times     Posture/Postural Control   Posture/Postural Control Postural limitations    Postural Limitations Rounded Shoulders      Sensation   Light Touch Impaired by gross assessment    Hot/Cold Impaired by gross assessment   per pt report entire L side   Additional Comments Per pt report, numbness entire L side      Coordination   Gross Motor Movements are Fluid and Coordinated No    Fine Motor Movements are Fluid and Coordinated No    9 Hole Peg Test Right;Left    Right 9 Hole Peg Test 66.85    Left 9 Hole Peg Test 41.25    Other ataxia noted RUE      ROM / Strength   AROM / PROM / Strength AROM;Strength      AROM   Overall AROM  Within functional limits for tasks performed    Overall AROM Comments BUEs grossly WFL with  delayed movement and coordination RUE      Strength   Overall Strength Within functional limits for tasks performed    Overall Strength Comments BUE proximal strength grossly 5/5      Hand Function   Right Hand Grip (lbs) 124.3    Left Hand Grip (lbs) 127.8  OT Education - 10/02/21 1524     Education Details OT eval results/POC    Person(s) Educated Patient;Spouse    Methods Explanation    Comprehension Verbalized understanding              OT Short Term Goals - 10/02/21 1535       OT SHORT TERM GOAL #1   Title Pt will be independent with initial HEP.--check STGs 11/02/21    Time 4    Period Weeks    Status New      OT SHORT TERM GOAL #2   Title Pt will perform BADLs mod I except supervision for shower transfer.    Time 4    Period Weeks    Status New      OT SHORT TERM GOAL #3   Title Pt will perform tabletop visual scanning with 90% accuracy and no reports of diplopia.    Time 4    Period Weeks    Status New      OT SHORT TERM GOAL #4   Title Pt will verbalize understanding of visual compensation strategies for ADLs/IADLs prn (including ?taping, head turns/positioning)    Time 4    Period Weeks    Status New      OT SHORT TERM GOAL #5   Title Pt will improve coordination for ADLs as shown by improving time on 9-hole peg test by at least 5sec bilaterally.    Baseline R-66.85sec, L-41.25sec    Time 4    Period Weeks    Status New               OT Long Term Goals - 10/02/21 1539       OT LONG TERM GOAL #1   Title Pt will be independent with updated HEP.    Time 12    Period Weeks    Status New      OT LONG TERM GOAL #2   Title Pt will perform simple snack prep/home maintenance task with supervision.    Time 12    Period Weeks    Status New      OT LONG TERM GOAL #3   Title Pt will perform environmental scanning/navigation with at least 90% accuracy for incr safety in the home/community.     Time 12    Period Weeks    Status New      OT LONG TERM GOAL #4   Title Pt will improve RUE coordination as shown by improving time on 9-hole peg test by at least 15sec.    Baseline 66.85sec    Time 12    Period Weeks    Status New      OT LONG TERM GOAL #5   Title Pt will be able to retrieve/replace 1-2 objects on overhead shelf with each UE x10 safetly and with good control for IADL tasks.    Time 12    Period Weeks    Status New                   Plan - 10/02/21 1525     Clinical Impression Statement Pt is a 65 y.o. male s/p cerebral infarction (R cerebellar and small R brachium potis and pontomedulary junction) on 08/29/21.  Pt was discharged from the hospital 09/27/21.  Pt with PMH that includes:  dyslipidemia, HTN, carotid stenosis, anemia, PAD.  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  Pt currently is unable to drive or work  and needs assist for ADLs/IADLs.  Pt presents today with decr coordination/ataxia, visual deficits/diplopia, decr balance/functional mobility, decr sensation.  Pt would benefit from occupational therapy to address these deficits for improved UE functional use and incr safety/independence with ADLs/IADLs.    OT Occupational Profile and History Detailed Assessment- Review of Records and additional review of physical, cognitive, psychosocial history related to current functional performance    Occupational performance deficits (Please refer to evaluation for details): ADL's;IADL's;Work;Leisure    Body Structure / Function / Physical Skills ADL;UE functional use;Balance;FMC;Coordination;Sensation;IADL;Decreased knowledge of use of DME;Dexterity;Vision    Rehab Potential Good    Clinical Decision Making Several treatment options, min-mod task modification necessary    Comorbidities Affecting Occupational Performance: May have comorbidities impacting occupational performance    Modification or Assistance to Complete Evaluation  Min-Moderate  modification of tasks or assist with assess necessary to complete eval    OT Frequency 2x / week    OT Duration 12 weeks   +eval   OT Treatment/Interventions Self-care/ADL training;Energy conservation;Ultrasound;Visual/perceptual remediation/compensation;Patient/family education;DME and/or AE instruction;Aquatic Therapy;Paraffin;Passive range of motion;Balance training;Cryotherapy;Functional Mobility Training;Moist Heat;Therapeutic exercise;Manual Therapy;Therapeutic activities;Neuromuscular education    Plan check HEP from hopsital if pt brings in; wt. bearing/functional reach, coordination, visual scanning, ?typing/writing and add goal prn    OT Home Exercise Plan Pt reports that he has some coordination activities and visual HEP from hospital    Consulted and Agree with Plan of Care Patient;Family member/caregiver    Family Member Consulted wife             Patient will benefit from skilled therapeutic intervention in order to improve the following deficits and impairments:   Body Structure / Function / Physical Skills: ADL, UE functional use, Balance, FMC, Coordination, Sensation, IADL, Decreased knowledge of use of DME, Dexterity, Vision       Visit Diagnosis: Other lack of coordination  Ataxia  Unsteadiness on feet  Visuospatial deficit  Other disturbances of skin sensation    Problem List Patient Active Problem List   Diagnosis Date Noted   Urinary retention 09/27/2021   Diplopia 09/27/2021   Carotid stenosis, right 09/27/2021   Anemia 09/27/2021   Dyslipidemia    Slow transit constipation    Benign essential HTN    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   Acute ischemic stroke [facility])    Cerebral embolism with cerebral infarction 08/30/2021   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 [place], [place], [address] Phone: [phone]   Fax:  [phone]  Name: Maurice Little MRN: [id number] Date of Birth: October 11, 1956  Willa Frater, OTR/L [facility] [address] [place], [place]  [address] [phone] phone [phone] 10/02/21 3:46 PM

## 2021-10-08 ENCOUNTER — Other Ambulatory Visit: Payer: Self-pay

## 2021-10-08 ENCOUNTER — Encounter: Payer: Self-pay | Admitting: Registered Nurse

## 2021-10-08 ENCOUNTER — Encounter: Payer: 59 | Attending: Registered Nurse | Admitting: Registered Nurse

## 2021-10-08 VITALS — BP 133/84 | HR 72 | Temp 98.1°F | Ht 72.0 in

## 2021-10-08 DIAGNOSIS — I6521 Occlusion and stenosis of right carotid artery: Secondary | ICD-10-CM | POA: Diagnosis not present

## 2021-10-08 DIAGNOSIS — E785 Hyperlipidemia, unspecified: Secondary | ICD-10-CM | POA: Insufficient documentation

## 2021-10-08 DIAGNOSIS — I10 Essential (primary) hypertension: Secondary | ICD-10-CM | POA: Diagnosis not present

## 2021-10-08 DIAGNOSIS — G510 Bell's palsy: Secondary | ICD-10-CM | POA: Diagnosis not present

## 2021-10-08 DIAGNOSIS — I639 Cerebral infarction, unspecified: Secondary | ICD-10-CM | POA: Insufficient documentation

## 2021-10-08 DIAGNOSIS — R339 Retention of urine, unspecified: Secondary | ICD-10-CM | POA: Insufficient documentation

## 2021-10-08 DIAGNOSIS — H4920 Sixth [abducent] nerve palsy, unspecified eye: Secondary | ICD-10-CM | POA: Diagnosis present

## 2021-10-08 NOTE — Progress Notes (Signed)
Subjective:    Patient ID: Maurice Little, male    DOB: Apr 15, 1956, 65 y.o.   MRN: [id number]  HPI: Maurice Little is a 65 y.o. male who returns for hospital follow up appointment of his Cerebellar Stroke, Cranial Nerve Palsy VI,  Cranial Nerve Palsy VII, Benign Essential Hypertension, Urinary Retention, Carotid Stenosis Right and Dyslipidemia. He was taken to [facility] on 08/29/2021 code Stroke.  Dr Wilford Corner Note: 08/29/2021 Neurology Admission H&P    Chief Complaint: stroke code, left sided weakness and facial droop per ems    CC: left sided tingling x 2 hours    HPI  [name] presents as code stroke via EMS when he experienced left sided tingling prior to arrival, 2 hours prior to presentation. His troubles began between 10/31-11/1, when he noted sudden onset dizziness and headache. He laid on the ground and these eventually subsided over the course of an hour, although they returned this AM.    2 hours prior to arrival, he had been having a bowel movement when he suddenly felt left sided tingling "like neuropathy" of left face, arm, leg. Has been constant since onset without change in character. No weakness.    History is obtained from:patient and his wife.    No personal or FMHx of stroke.    After brief exam at ED bridge, patient taken emergently to CT suite.  CTH  reveals subacute appearing right cerebellar infarcts.  CTA head and neck with atherosclerosis throughout but most concerning for non-occlusive basilar thrombus   CT Head:  IMPRESSION: There is no acute intracranial hemorrhage. ASPECT score is 10. 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  CTA: Cerebral Perfusion:  IMPRESSION: Abnormal extracranial right vertebral artery with limited opacification proximally and reconstitution at the C4 level. May reflect high-grade origin stenosis. Right PICA or like AICA origins are not identified. Basilar artery is patent but there is question of nonocclusive  clot proximally.   Noncalcified plaque at the right ICA origin causes 65% stenosis. Mixed plaque at the left ICA origin causes less than 50% stenosis.   Diffuse mild narrowing of the intracranial left vertebral artery, which terminates as a PICA.   Perfusion imaging demonstrates no evidence of core infarction or penumbra, but there is limited evaluation of the posterior fossa.   MR Brain WO Contrast: IMPRESSION: Acute infarcts of the right cerebellar hemisphere. Small additional involvement of right brachium pontis and right pontomedullary junction. No hemorrhage or significant mass effect.   Mild chronic microvascular ischemic changes.  Dr Pearlean Brownie Discharge Summary Note: 09/04/2021 HOSPITAL COURSE  On 08/30/2021 pt was S/P 4 vessel cerebral arteriograms followed by stent assisted angioplasty of occluded dominant RT VA origin and midbasilar artery prominent stenosis. RT rad approach. Wrist band applied for hemostasis. Post CT NO ICH or hydrocephalus. Extubated    Stroke:  right cerebellum and right lower pontine infarct due to right VA occlusion and BA high grade stenosis s/p right VA and BA stenting, etiology most likely due to atherosclerosis.     Resultant right peripheral facial droop (CN VII), right lateral gaze incomplete (CN VI), right hearing loss (CN VIII and AICA territory), left sided numbness (right spinal lemniscus and right trigeminothalamic tract), right cerebellum (right PICA and AICA) CT showed right cerebellar infarct CT head and neck right VA origin occlusion, reconstituted at distal V2.  Right PICA occlusion, mid basilar artery high-grade stenosis versus thrombosis, right ICA 65% stenosis, left VA ends at PICA MRI right cerebellar infarct, and  right lower pontine infarct. IR right VA origin, mid to basilar artery prominent stenosis, status post stenting in both arteries.  Severe 90% stenosis right ICA proximal, 50% stenosis left ICA proximal. MRI repeat 11/5  demonstrates increased size of right cerebellar infarct, new patchy right cerebellar infarcts in AICA territors, new infarcts in right middle cerebellar peduncle, right medulla, right and left pons, new punctate infarcts in left parietal and occipital lobes. 2D Echo EF 60 to 65% TEE performed 11/4, LVEF 60-65%No LA/LAAthrombus or mass.+PFO with R-->L shuntingAtherosclerosis of the descending aorta. Recommend 30 day cardiac event monitoring as outpt to rule out afib LDL 220 HgbA1c 5.5 SCDs for DVT prophylaxis No antithrombotic prior to admission, now on aspirin 81 mg daily, Brilinta (ticagrelor) 90 mg bid, Patient counseled to be compliant with his antithrombotic medications Ongoing aggressive stroke risk factor management Therapy recommendations: CIR Disposition: Pending   Carotid stenosis, bilateral CTA head and neck showed right ICA 65% stenosis Cerebral angiogram showed right ICA proximal 90% stenosis, left ICA proximal 50% stenosis Dr. Corliss Skains plan for right ICA stenting in the near future likely in 6 weeks after rehab stay.   [name] was admitted to inpatient Rehabilitation on 09/04/2021 and discharged home on 09/27/2021. He is receiving outpatient therapy at [facility]. He denies any pain. He rates his pain 0.   Caregiver in room, all questions answered. Maurice Little spoke with Mr. Fines and Caregiver regarding their concerns.    Pain Inventory Average Pain 9 Pain Right Now 0 My pain is intermittent and burning  LOCATION OF PAIN  Groin  BOWEL Number of stools per week: 7 Oral laxative use Yes  Type of laxative Senokot, Miralax Enema or suppository use No  History of colostomy No  Incontinent No   BLADDER Foley  Mobility walk with assistance use a walker ability to climb steps?  no do you drive?  no use a wheelchair transfers alone Do you have any goals in this area?  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 I need  assistance with the following:  dressing, bathing, toileting, meal prep, household duties, and shopping Do you have any goals in this area?  yes  Neuro/Psych weakness numbness trouble walking spasms dizziness  Prior Studies Any changes since last visit?  no  Physicians involved in your care Any changes since last visit?  yes PCP Ceasar Mons [place]   Family History  Problem Relation Age of Onset   Healthy Mother    Social History   Socioeconomic History   Marital status: Married    Spouse name: Not on file   Number of children: Not on file   Years of education: Not on file   Highest education level: Not on file  Occupational History   Not on file  Tobacco Use   Smoking status: Former    Types: Cigarettes   Smokeless tobacco: Never  Vaping Use   Vaping Use: Never used  Substance and Sexual Activity   Alcohol use: Not Currently   Drug use: Not on file   Sexual activity: Not on file  Other Topics Concern   Not on file  Social History Narrative   Not on file   Social Determinants of Health   Financial Resource Strain: Not on file  Food Insecurity: Not on file  Transportation Needs: Not on file  Physical Activity: Not on file  Stress: Not on file  Social Connections: Not on file   Past Surgical History:  Procedure Laterality Date   BUBBLE STUDY  08/31/2021   Procedure: BUBBLE STUDY;  Surgeon: Chilton Si, MD;  Location: [facility] ENDOSCOPY;  Service: Cardiovascular;;   ILIAC ARTERY STENT Left    IR ANGIO VERTEBRAL SEL SUBCLAVIAN INNOMINATE UNI L MOD SED  08/30/2021   IR ANGIO VERTEBRAL SEL SUBCLAVIAN INNOMINATE UNI R MOD SED  08/30/2021   IR CT HEAD LTD  08/30/2021   IR INTRA CRAN STENT  08/30/2021   IR US GUIDE VASC ACCESS RIGHT  08/30/2021   RADIOLOGY WITH ANESTHESIA N/A 08/30/2021   Procedure: IR WITH ANESTHESIA;  Surgeon: Julieanne Cotton, MD;  Location: [facility] OR;  Service: Radiology;  Laterality: N/A;   TEE WITHOUT CARDIOVERSION N/A 08/31/2021    Procedure: TRANSESOPHAGEAL ECHOCARDIOGRAM (TEE);  Surgeon: Chilton Si, MD;  Location: [facility] ENDOSCOPY;  Service: Cardiovascular;  Laterality: N/A;   Past Medical History:  Diagnosis Date   Benign prostatic hyperplasia with elevated prostate specific antigen (PSA)    Hypertension    Iliac artery stenosis, left (HCC)    with claudication resolved with stent   PAD (peripheral artery disease) (HCC)    BP 133/84   Pulse 72   Temp 98.1 F (36.7 C)   Ht 6' (1.829 m)   SpO2 98%   BMI 28.37 kg/m   Opioid Risk Score:   Fall Risk Score:  `1  Depression screen PHQ 2/9  Depression screen PHQ 2/9 10/08/2021  Decreased Interest 0  Down, Depressed, Hopeless 0  PHQ - 2 Score 0  Altered sleeping 0  Tired, decreased energy 0  Change in appetite 0  Feeling bad or failure about yourself  0  Trouble concentrating 0  Moving slowly or fidgety/restless 0  Suicidal thoughts 0  PHQ-9 Score 0    Review of Systems  Genitourinary:        Foley in place  Musculoskeletal:  Positive for gait problem.  All other systems reviewed and are negative.     Objective:   Physical Exam Vitals and nursing note reviewed.  Constitutional:      Appearance: Normal appearance.  Cardiovascular:     Rate and Rhythm: Normal rate and regular rhythm.     Pulses: Normal pulses.     Heart sounds: Normal heart sounds.  Pulmonary:     Effort: Pulmonary effort is normal.     Breath sounds: Normal breath sounds.  Musculoskeletal:     Cervical back: Normal range of motion and neck supple.     Comments: Normal Muscle Bulk and Muscle Testing Reveals:  Upper Extremities: Full ROM and Muscle Strength  5/5 Lower Extremities: Full ROM and Muscle Strength 5/5     Skin:    General: Skin is warm and dry.  Neurological:     Mental Status: He is alert and oriented to person, place, and time.  Psychiatric:        Mood and Affect: Mood normal.        Behavior: Behavior normal.          Assessment & Plan:   Cerebellar Stroke: He is receiving outpatient therapy at [facility]. He has a scheduled appointment with [facility] Neurology.  Cranial Nerve Palsy VI: Referral Place to Dr Maple Hudson 3.  Cranial Nerve Palsy VII: Referral Placed to Dr Annalee Genta 4. Benign Essential Hypertension: Continue current medication regimen. PCP following. Continue to Monitor 5. Urinary Retention: Urology Following.  6. Carotid Stenosis Right: Dr Corliss Skains plans on right ICA stenting in 6 weeks 7. Dyslipidemia.: Continue current medication Regimen.  F/U with Dr Wynn Banker in 4- 6 weeks

## 2021-10-09 ENCOUNTER — Encounter: Payer: Self-pay | Admitting: Occupational Therapy

## 2021-10-09 ENCOUNTER — Encounter: Payer: Self-pay | Admitting: Physical Medicine & Rehabilitation

## 2021-10-09 ENCOUNTER — Ambulatory Visit: Payer: 59 | Admitting: Occupational Therapy

## 2021-10-09 DIAGNOSIS — R2681 Unsteadiness on feet: Secondary | ICD-10-CM

## 2021-10-09 DIAGNOSIS — R41842 Visuospatial deficit: Secondary | ICD-10-CM

## 2021-10-09 DIAGNOSIS — R270 Ataxia, unspecified: Secondary | ICD-10-CM

## 2021-10-09 DIAGNOSIS — R278 Other lack of coordination: Secondary | ICD-10-CM

## 2021-10-09 DIAGNOSIS — R208 Other disturbances of skin sensation: Secondary | ICD-10-CM

## 2021-10-09 NOTE — Therapy (Signed)
[facility] [address] [place], [place], [address] Phone: [phone]   Fax:  [phone]  Occupational Therapy Treatment  Patient Details  Name: Maurice Little MRN: [id number] Date of Birth: Oct 25, 1956 Referring Provider (OT): Mariam Dollar, PA-C   Encounter Date: 10/09/2021   OT End of Session - 10/09/21 1441     Visit Number 2    Number of Visits 25    Date for OT Re-Evaluation 12/31/21    Authorization Type U[employer]--awaiting insurance verfication    OT Start Time 1236    OT Stop Time 1320    OT Time Calculation (min) 44 min    Activity Tolerance Patient tolerated treatment well    Behavior During Therapy WFL for tasks assessed/performed             Past Medical History:  Diagnosis Date   Benign prostatic hyperplasia with elevated prostate specific antigen (PSA)    Hypertension    Iliac artery stenosis, left (HCC)    with claudication resolved with stent   PAD (peripheral artery disease) (HCC)     Past Surgical History:  Procedure Laterality Date   BUBBLE STUDY  08/31/2021   Procedure: BUBBLE STUDY;  Surgeon: Chilton Si, MD;  Location: [facility] ENDOSCOPY;  Service: Cardiovascular;;   ILIAC ARTERY STENT Left    IR ANGIO VERTEBRAL SEL SUBCLAVIAN INNOMINATE UNI L MOD SED  08/30/2021   IR ANGIO VERTEBRAL SEL SUBCLAVIAN INNOMINATE UNI R MOD SED  08/30/2021   IR CT HEAD LTD  08/30/2021   IR INTRA CRAN STENT  08/30/2021   IR US GUIDE VASC ACCESS RIGHT  08/30/2021   RADIOLOGY WITH ANESTHESIA N/A 08/30/2021   Procedure: IR WITH ANESTHESIA;  Surgeon: Julieanne Cotton, MD;  Location: [facility] OR;  Service: Radiology;  Laterality: N/A;   TEE WITHOUT CARDIOVERSION N/A 08/31/2021   Procedure: TRANSESOPHAGEAL ECHOCARDIOGRAM (TEE);  Surgeon: Chilton Si, MD;  Location[facility] ENDOSCOPY;  Service: Cardiovascular;  Laterality: N/A;    There were no vitals filed for this visit.   Subjective Assessment - 10/09/21 1239      Subjective  This was good    Patient is accompanied by: Family member   [name]   Pertinent History CVA (R cereballar and R small R brachium potis and R pontomedullary junction).   PMH:  dyslipidemia, HTN, carotid stenosis, anemia, PAD    Limitations ataxia, diplopia, folley catheter, decr hearing (particularly R side)    Patient Stated Goals incr independence, get better    Currently in Pain? No/denies              Reviewed pt's HEP from the hospital (finger ROM and coordination activities, visual HEP--tracking with each eye and both eyes, convergence, x1 viewing, and PT--bridging, sti>stand, alternating step taps, side stepping, and walking).  All continue to be appropriate.  Practiced printing name on 2 lines then on 1 line with 90% legibility, then tracing large alphabet with min-mod decr accuracy, incr time, but no reports of diplopia.  Typing test:  39% accuracy, 10wpm, 4wpm net speed (pt with difficulty R hand>L, min difficulty L hand due to decr sensation).  Standing at table to perform functional reaching to placing to place/remove clothespins with 1-8lb resistance on vertical pole.  Initially with LUE support, then without UE support (therapist provided CGA) and min-mod cueing for RUE positioning/to avoid compensation and for slow, controlled movements.           OT Education - 10/09/21 1443  Education Details R handed typing words; avoid compensation patterns with RUE reach/use (elbow down, shoulder down)    Person(s) Educated Patient;Spouse    Methods Explanation;Demonstration;Handout;Verbal cues    Comprehension Verbalized understanding;Returned demonstration;Verbal cues required              OT Short Term Goals - 10/02/21 1535       OT SHORT TERM GOAL #1   Title Pt will be independent with initial HEP.--check STGs 11/02/21    Time 4    Period Weeks    Status New      OT SHORT TERM GOAL #2   Title Pt will perform BADLs mod I except supervision  for shower transfer.    Time 4    Period Weeks    Status New      OT SHORT TERM GOAL #3   Title Pt will perform tabletop visual scanning with 90% accuracy and no reports of diplopia.    Time 4    Period Weeks    Status New      OT SHORT TERM GOAL #4   Title Pt will verbalize understanding of visual compensation strategies for ADLs/IADLs prn (including ?taping, head turns/positioning)    Time 4    Period Weeks    Status New      OT SHORT TERM GOAL #5   Title Pt will improve coordination for ADLs as shown by improving time on 9-hole peg test by at least 5sec bilaterally.    Baseline R-66.85sec, L-41.25sec    Time 4    Period Weeks    Status New               OT Long Term Goals - 10/02/21 1539       OT LONG TERM GOAL #1   Title Pt will be independent with updated HEP.    Time 12    Period Weeks    Status New      OT LONG TERM GOAL #2   Title Pt will perform simple snack prep/home maintenance task with supervision.    Time 12    Period Weeks    Status New      OT LONG TERM GOAL #3   Title Pt will perform environmental scanning/navigation with at least 90% accuracy for incr safety in the home/community.    Time 12    Period Weeks    Status New      OT LONG TERM GOAL #4   Title Pt will improve RUE coordination as shown by improving time on 9-hole peg test by at least 15sec.    Baseline 66.85sec    Time 12    Period Weeks    Status New      OT LONG TERM GOAL #5   Title Pt will be able to retrieve/replace 1-2 objects on overhead shelf with each UE x10 safetly and with good control for IADL tasks.    Time 12    Period Weeks    Status New                   Plan - 10/09/21 1441     Clinical Impression Statement Pt is progressing towards goals with improving balance and RUE control but needs cueing for compensaton patterns with R shoulder    OT Occupational Profile and History Detailed Assessment- Review of Records and additional review of physical,  cognitive, psychosocial history related to current functional performance    Occupational performance deficits (Please refer to  evaluation for details): ADL's;IADL's;Work;Leisure    Body Structure / Function / Physical Skills ADL;UE functional use;Balance;FMC;Coordination;Sensation;IADL;Decreased knowledge of use of DME;Dexterity;Vision    Rehab Potential Good    Clinical Decision Making Several treatment options, min-mod task modification necessary    Comorbidities Affecting Occupational Performance: May have comorbidities impacting occupational performance    Modification or Assistance to Complete Evaluation  Min-Moderate modification of tasks or assist with assess necessary to complete eval    OT Frequency 2x / week    OT Duration 12 weeks   +eval   OT Treatment/Interventions Self-care/ADL training;Energy conservation;Ultrasound;Visual/perceptual remediation/compensation;Patient/family education;DME and/or AE instruction;Aquatic Therapy;Paraffin;Passive range of motion;Balance training;Cryotherapy;Functional Mobility Training;Moist Heat;Therapeutic exercise;Manual Therapy;Therapeutic activities;Neuromuscular education    Plan ? wt. bearing/functional reach, coordination, visual scanning    OT Home Exercise Plan Pt reports that he has some coordination activities and visual HEP from hospital    Consulted and Agree with Plan of Care Patient;Family member/caregiver    Family Member Consulted wife             Patient will benefit from skilled therapeutic intervention in order to improve the following deficits and impairments:   Body Structure / Function / Physical Skills: ADL, UE functional use, Balance, FMC, Coordination, Sensation, IADL, Decreased knowledge of use of DME, Dexterity, Vision       Visit Diagnosis: Other lack of coordination  Ataxia  Unsteadiness on feet  Visuospatial deficit  Other disturbances of skin sensation    Problem List Patient Active Problem List    Diagnosis Date Noted   Urinary retention 09/27/2021   Diplopia 09/27/2021   Carotid stenosis, right 09/27/2021   Anemia 09/27/2021   Dyslipidemia    Slow transit constipation    Benign essential HTN    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   Acute ischemic stroke [facility])    Cerebral embolism with cerebral infarction 08/30/2021   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 [place], [place]  [address] [phone] phone [phone] 10/09/21 2:44 PM

## 2021-10-12 ENCOUNTER — Ambulatory Visit: Payer: 59 | Admitting: Occupational Therapy

## 2021-10-12 ENCOUNTER — Encounter: Payer: Self-pay | Admitting: Occupational Therapy

## 2021-10-12 ENCOUNTER — Other Ambulatory Visit: Payer: Self-pay

## 2021-10-12 DIAGNOSIS — R278 Other lack of coordination: Secondary | ICD-10-CM

## 2021-10-12 DIAGNOSIS — R41842 Visuospatial deficit: Secondary | ICD-10-CM

## 2021-10-12 DIAGNOSIS — R208 Other disturbances of skin sensation: Secondary | ICD-10-CM

## 2021-10-12 DIAGNOSIS — R2681 Unsteadiness on feet: Secondary | ICD-10-CM

## 2021-10-12 NOTE — Patient Instructions (Addendum)
Have your wife with you and perfrom the following: Stand with hands on counter at sink, rock forwards and backwards slowly Lift chest and squeeze shoulder blades back when you rock forwards, 10-20 reps 1 x day

## 2021-10-12 NOTE — Therapy (Signed)
[facility] [address] [place], [place], [address] Phone: [phone]   Fax:  [phone]  Occupational Therapy Treatment  Patient Details  Name: Maurice Little MRN: [id number] Date of Birth: 09-28-1956 Referring Provider (OT): Mariam Dollar, PA-C   Encounter Date: 10/12/2021   OT End of Session - 10/12/21 1023     Visit Number 3    Number of Visits 25    Date for OT Re-Evaluation 12/31/21    Authorization Type UHC--awaiting insurance verfication    OT Start Time 1021    OT Stop Time 1100    OT Time Calculation (min) 39 min             Past Medical History:  Diagnosis Date   Benign prostatic hyperplasia with elevated prostate specific antigen (PSA)    Hypertension    Iliac artery stenosis, left (HCC)    with claudication resolved with stent   PAD (peripheral artery disease) (HCC)     Past Surgical History:  Procedure Laterality Date   BUBBLE STUDY  08/31/2021   Procedure: BUBBLE STUDY;  Surgeon: Chilton Si, MD;  Location: [facility] ENDOSCOPY;  Service: Cardiovascular;;   ILIAC ARTERY STENT Left    IR ANGIO VERTEBRAL SEL SUBCLAVIAN INNOMINATE UNI L MOD SED  08/30/2021   IR ANGIO VERTEBRAL SEL SUBCLAVIAN INNOMINATE UNI R MOD SED  08/30/2021   IR CT HEAD LTD  08/30/2021   IR INTRA CRAN STENT  08/30/2021   IR US GUIDE VASC ACCESS RIGHT  08/30/2021   RADIOLOGY WITH ANESTHESIA N/A 08/30/2021   Procedure: IR WITH ANESTHESIA;  Surgeon: Julieanne Cotton, MD;  Location: [facility] OR;  Service: Radiology;  Laterality: N/A;   TEE WITHOUT CARDIOVERSION N/A 08/31/2021   Procedure: TRANSESOPHAGEAL ECHOCARDIOGRAM (TEE);  Surgeon: Chilton Si, MD;  Location: [facility] ENDOSCOPY;  Service: Cardiovascular;  Laterality: N/A;    There were no vitals filed for this visit.   Subjective Assessment - 10/12/21 1023     Pertinent History CVA (R cereballar and R small R brachium potis and R pontomedullary junction).   PMH:   dyslipidemia, HTN, carotid stenosis, anemia, PAD    Limitations ataxia, diplopia, folley catheter, decr hearing (particularly R side)    Patient Stated Goals incr independence, get better    Currently in Pain? No/denies                   Treatment:pt reports assisting with ADL at home and performing his vision exercises. Fine motor coordination task to place and remove large to small pegs with min v.c for keeping RUE low and compensating for ataxia , focus on in hand manipulation- increased time required Lertter cancellation for scanning, 62M print, only 1 omitted item in 1 page of information, min v.c to keep arm low when marking items and to avoid shoulder hike.              OT Education - 10/12/21 1056     Education Details rocking frowards and backwards in modified quadraped at table for weightbearing    Person(s) Educated Patient;Spouse    Methods Explanation;Demonstration;Handout;Verbal cues    Comprehension Verbalized understanding;Returned demonstration;Verbal cues required              OT Short Term Goals - 10/02/21 1535       OT SHORT TERM GOAL #1   Title Pt will be independent with initial HEP.--check STGs 11/02/21    Time 4    Period Weeks    Status  New      OT SHORT TERM GOAL #2   Title Pt will perform BADLs mod I except supervision for shower transfer.    Time 4    Period Weeks    Status New      OT SHORT TERM GOAL #3   Title Pt will perform tabletop visual scanning with 90% accuracy and no reports of diplopia.    Time 4    Period Weeks    Status New      OT SHORT TERM GOAL #4   Title Pt will verbalize understanding of visual compensation strategies for ADLs/IADLs prn (including ?taping, head turns/positioning)    Time 4    Period Weeks    Status New      OT SHORT TERM GOAL #5   Title Pt will improve coordination for ADLs as shown by improving time on 9-hole peg test by at least 5sec bilaterally.    Baseline R-66.85sec, L-41.25sec     Time 4    Period Weeks    Status New               OT Long Term Goals - 10/02/21 1539       OT LONG TERM GOAL #1   Title Pt will be independent with updated HEP.    Time 12    Period Weeks    Status New      OT LONG TERM GOAL #2   Title Pt will perform simple snack prep/home maintenance task with supervision.    Time 12    Period Weeks    Status New      OT LONG TERM GOAL #3   Title Pt will perform environmental scanning/navigation with at least 90% accuracy for incr safety in the home/community.    Time 12    Period Weeks    Status New      OT LONG TERM GOAL #4   Title Pt will improve RUE coordination as shown by improving time on 9-hole peg test by at least 15sec.    Baseline 66.85sec    Time 12    Period Weeks    Status New      OT LONG TERM GOAL #5   Title Pt will be able to retrieve/replace 1-2 objects on overhead shelf with each UE x10 safetly and with good control for IADL tasks.    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 [place], [place], [address] Phone: [phone]  Fax:  [phone]  Name: Maurice Little MRN: [id number] Date of Birth: [birth year]

## 2021-10-13 ENCOUNTER — Encounter: Payer: Self-pay | Admitting: Registered Nurse

## 2021-10-14 ENCOUNTER — Other Ambulatory Visit: Payer: Self-pay | Admitting: Physical Medicine and Rehabilitation

## 2021-10-14 ENCOUNTER — Encounter: Payer: Self-pay | Admitting: Physical Medicine & Rehabilitation

## 2021-10-16 ENCOUNTER — Ambulatory Visit: Payer: 59 | Admitting: Occupational Therapy

## 2021-10-16 ENCOUNTER — Encounter: Payer: Self-pay | Admitting: Physical Therapy

## 2021-10-16 ENCOUNTER — Other Ambulatory Visit: Payer: Self-pay

## 2021-10-16 ENCOUNTER — Ambulatory Visit: Payer: 59 | Admitting: Physical Therapy

## 2021-10-16 ENCOUNTER — Encounter: Payer: Self-pay | Admitting: Physical Medicine & Rehabilitation

## 2021-10-16 ENCOUNTER — Other Ambulatory Visit (HOSPITAL_BASED_OUTPATIENT_CLINIC_OR_DEPARTMENT_OTHER): Payer: Self-pay | Admitting: Family Medicine

## 2021-10-16 ENCOUNTER — Telehealth: Payer: Self-pay

## 2021-10-16 ENCOUNTER — Encounter: Payer: Self-pay | Admitting: Occupational Therapy

## 2021-10-16 ENCOUNTER — Other Ambulatory Visit: Payer: Self-pay | Admitting: Physical Medicine & Rehabilitation

## 2021-10-16 DIAGNOSIS — I639 Cerebral infarction, unspecified: Secondary | ICD-10-CM

## 2021-10-16 DIAGNOSIS — R278 Other lack of coordination: Secondary | ICD-10-CM

## 2021-10-16 DIAGNOSIS — R260 Ataxic gait: Secondary | ICD-10-CM

## 2021-10-16 DIAGNOSIS — R41842 Visuospatial deficit: Secondary | ICD-10-CM

## 2021-10-16 DIAGNOSIS — R270 Ataxia, unspecified: Secondary | ICD-10-CM

## 2021-10-16 DIAGNOSIS — R208 Other disturbances of skin sensation: Secondary | ICD-10-CM

## 2021-10-16 DIAGNOSIS — R29818 Other symptoms and signs involving the nervous system: Secondary | ICD-10-CM

## 2021-10-16 DIAGNOSIS — R2681 Unsteadiness on feet: Secondary | ICD-10-CM

## 2021-10-16 DIAGNOSIS — R2689 Other abnormalities of gait and mobility: Secondary | ICD-10-CM

## 2021-10-16 MED ORDER — LOSARTAN POTASSIUM 25 MG PO TABS: 25.0000 mg | ORAL_TABLET | Freq: Every day | ORAL | 0 refills | Status: DC

## 2021-10-16 MED ORDER — TICAGRELOR 90 MG PO TABS
90.0000 mg | ORAL_TABLET | Freq: Two times a day (BID) | ORAL | 1 refills | Status: DC
Start: 2021-10-16 — End: 2022-01-17

## 2021-10-16 MED ORDER — FINASTERIDE 5 MG PO TABS: 5.0000 mg | ORAL_TABLET | Freq: Every day | ORAL | 1 refills | Status: DC

## 2021-10-16 MED ORDER — HYDRALAZINE HCL 10 MG PO TABS
10.0000 mg | ORAL_TABLET | Freq: Three times a day (TID) | ORAL | 0 refills | Status: DC
Start: 2021-10-16 — End: 2021-11-22

## 2021-10-16 MED ORDER — AMLODIPINE BESYLATE 5 MG PO TABS: 5.0000 mg | ORAL_TABLET | Freq: Every day | ORAL | 0 refills | Status: DC

## 2021-10-16 MED ORDER — PANTOPRAZOLE SODIUM 40 MG PO TBEC: 40.0000 mg | DELAYED_RELEASE_TABLET | Freq: Every day | ORAL | 0 refills | Status: DC

## 2021-10-16 MED ORDER — CITALOPRAM HYDROBROMIDE 10 MG PO TABS: 10.0000 mg | ORAL_TABLET | Freq: Every day | ORAL | 0 refills | Status: DC

## 2021-10-16 MED ORDER — ROSUVASTATIN CALCIUM 40 MG PO TABS: 40.0000 mg | ORAL_TABLET | Freq: Every day | ORAL | 0 refills | Status: DC

## 2021-10-16 MED ORDER — TAMSULOSIN HCL 0.4 MG PO CAPS: 0.4000 mg | ORAL_CAPSULE | Freq: Every day | ORAL | 0 refills | Status: DC

## 2021-10-16 NOTE — Telephone Encounter (Signed)
Patient is scheduled for a new patient visit on Nov 06 2021. Patient has not established care with Dr. de Peru as of today, 10/16/2021

## 2021-10-16 NOTE — Telephone Encounter (Signed)
Patient notified through Mychart.

## 2021-10-16 NOTE — Patient Instructions (Signed)
° °  SHOULDER: Flexion Bilateral    Raise arms overhead at same speed. Keep elbows straight. 15 reps per set, 1 sets per day, Maintain object in midline and go slow keeping shoulders away from ears.      Upper Extremity: Chest Pass (Standing)    SIT, holding ball at chest and pull shoulder blades together (make sure shoulder stays down).  Then push ball out to straighten elbows. Repeat 15 times per set.  1x/day.    Ball Chop    Sit, start with ball at left knee and raise in diagonal to the right side twist body, keep elbows straight x10.  Repeat 10x then repeat in other direction (start on right knee).     SITTING: Reach Across Body    Weight bear on right hand. Reach across body with other hand to reach target (overhead).   10  reps per set, 1 sets per day Keep right elbow straight.

## 2021-10-16 NOTE — Therapy (Signed)
[facility] Outpt Rehabilitation [facility] [address] [place], [place], [address] Phone: [phone]   Fax:  [phone]  Occupational Therapy Treatment  Patient Details  Name: Maurice Little MRN: [id number] Date of Birth: 02-23-1956 Referring Provider (OT): Mariam Dollar, PA-C   Encounter Date: 10/16/2021   OT End of Session - 10/16/21 0941     Visit Number 4    Number of Visits 25    Date for OT Re-Evaluation 12/31/21    Authorization Type UHC--awaiting insurance verfication    OT Start Time [id number]    OT Stop Time 0930    OT Time Calculation (min) 38 min    Activity Tolerance Patient tolerated treatment well    Behavior During Therapy [facility] for tasks assessed/performed             Past Medical History:  Diagnosis Date   Benign prostatic hyperplasia with elevated prostate specific antigen (PSA)    Hypertension    Iliac artery stenosis, left (HCC)    with claudication resolved with stent   PAD (peripheral artery disease) (HCC)     Past Surgical History:  Procedure Laterality Date   BUBBLE STUDY  08/31/2021   Procedure: BUBBLE STUDY;  Surgeon: Chilton Si, MD;  Location: [facility] ENDOSCOPY;  Service: Cardiovascular;;   ILIAC ARTERY STENT Left    IR ANGIO VERTEBRAL SEL SUBCLAVIAN INNOMINATE UNI L MOD SED  08/30/2021   IR ANGIO VERTEBRAL SEL SUBCLAVIAN INNOMINATE UNI R MOD SED  08/30/2021   IR CT HEAD LTD  08/30/2021   IR INTRA CRAN STENT  08/30/2021   IR US GUIDE VASC ACCESS RIGHT  08/30/2021   RADIOLOGY WITH ANESTHESIA N/A 08/30/2021   Procedure: IR WITH ANESTHESIA;  Surgeon: Julieanne Cotton, MD;  Location: [facility] OR;  Service: Radiology;  Laterality: N/A;   TEE WITHOUT CARDIOVERSION N/A 08/31/2021   Procedure: TRANSESOPHAGEAL ECHOCARDIOGRAM (TEE);  Surgeon: Chilton Si, MD;  Location: [facility] ENDOSCOPY;  Service: Cardiovascular;  Laterality: N/A;    There were no vitals filed for this visit.   Subjective Assessment - 10/16/21 0940      Subjective  My right arm is more stable, but I still knock over things sometimes    Patient is accompanied by: Family member    Pertinent History CVA (R cereballar and R small R brachium potis and R pontomedullary junction).   PMH:  dyslipidemia, HTN, carotid stenosis, anemia, PAD    Limitations ataxia, diplopia, folley catheter, decr hearing (particularly R side)    Patient Stated Goals incr independence, get better    Currently in Pain? No/denies             Pt transferred w/c>mat and mat>w/c with close supervision (squat pivot).    Sitting on mat, performing functional reaching laterally and across body (mid-level) to place coins in targets with min cueing for shoulder compensation initially and for slow controlled movements with good control demonstrated (incorporating lateral wt. Shift).  Sitting, reaching laterally and midlevel across body with RUE to grasp cylinder objects with wt. Shift and trunk rotation with min v.c. for control.        OT Education - 10/16/21 0944     Education Details HEP for closed-chain shoulder movements and wt. bearing with body on arm movements.--see pt instructions    Person(s) Educated Patient;Spouse    Methods Explanation;Demonstration;Handout;Verbal cues;Tactile cues    Comprehension Verbalized understanding;Returned demonstration;Verbal cues required              OT Short Term Goals -  10/02/21 1535       OT SHORT TERM GOAL #1   Title Pt will be independent with initial HEP.--check STGs 11/02/21    Time 4    Period Weeks    Status New      OT SHORT TERM GOAL #2   Title Pt will perform BADLs mod I except supervision for shower transfer.    Time 4    Period Weeks    Status New      OT SHORT TERM GOAL #3   Title Pt will perform tabletop visual scanning with 90% accuracy and no reports of diplopia.    Time 4    Period Weeks    Status New      OT SHORT TERM GOAL #4   Title Pt will verbalize understanding of visual  compensation strategies for ADLs/IADLs prn (including ?taping, head turns/positioning)    Time 4    Period Weeks    Status New      OT SHORT TERM GOAL #5   Title Pt will improve coordination for ADLs as shown by improving time on 9-hole peg test by at least 5sec bilaterally.    Baseline R-66.85sec, L-41.25sec    Time 4    Period Weeks    Status New               OT Long Term Goals - 10/02/21 1539       OT LONG TERM GOAL #1   Title Pt will be independent with updated HEP.    Time 12    Period Weeks    Status New      OT LONG TERM GOAL #2   Title Pt will perform simple snack prep/home maintenance task with supervision.    Time 12    Period Weeks    Status New      OT LONG TERM GOAL #3   Title Pt will perform environmental scanning/navigation with at least 90% accuracy for incr safety in the home/community.    Time 12    Period Weeks    Status New      OT LONG TERM GOAL #4   Title Pt will improve RUE coordination as shown by improving time on 9-hole peg test by at least 15sec.    Baseline 66.85sec    Time 12    Period Weeks    Status New      OT LONG TERM GOAL #5   Title Pt will be able to retrieve/replace 1-2 objects on overhead shelf with each UE x10 safetly and with good control for IADL tasks.    Time 12    Period Weeks    Status New                   Plan - 10/16/21 0942     Clinical Impression Statement Pt is progressing towards goals with improving RUE control and decr compensaton patterns with R shoulder with inital cueing.    OT Occupational Profile and History Detailed Assessment- Review of Records and additional review of physical, cognitive, psychosocial history related to current functional performance    Occupational performance deficits (Please refer to evaluation for details): ADL's;IADL's;Work;Leisure    Body Structure / Function / Physical Skills ADL;UE functional use;Balance;FMC;Coordination;Sensation;IADL;Decreased knowledge of use  of DME;Dexterity;Vision    Rehab Potential Good    Clinical Decision Making Several treatment options, min-mod task modification necessary    Comorbidities Affecting Occupational Performance: May have comorbidities impacting occupational performance  Modification or Assistance to Complete Evaluation  Min-Moderate modification of tasks or assist with assess necessary to complete eval    OT Frequency 2x / week    OT Duration 12 weeks   +eval   OT Treatment/Interventions Self-care/ADL training;Energy conservation;Ultrasound;Visual/perceptual remediation/compensation;Patient/family education;DME and/or AE instruction;Aquatic Therapy;Paraffin;Passive range of motion;Balance training;Cryotherapy;Functional Mobility Training;Moist Heat;Therapeutic exercise;Manual Therapy;Therapeutic activities;Neuromuscular education    Plan continue with neuro re-ed, coordination, visual scanning    OT Home Exercise Plan Pt reports that he has some coordination activities and visual HEP from hospital    Consulted and Agree with Plan of Care Patient;Family member/caregiver    Family Member Consulted wife             Patient will benefit from skilled therapeutic intervention in order to improve the following deficits and impairments:   Body Structure / Function / Physical Skills: ADL, UE functional use, Balance, FMC, Coordination, Sensation, IADL, Decreased knowledge of use of DME, Dexterity, Vision       Visit Diagnosis: Other lack of coordination  Visuospatial deficit  Other disturbances of skin sensation  Unsteadiness on feet  Ataxia    Problem List Patient Active Problem List   Diagnosis Date Noted   Urinary retention 09/27/2021   Diplopia 09/27/2021   Carotid stenosis, right 09/27/2021   Anemia 09/27/2021   Dyslipidemia    Slow transit constipation    Benign essential HTN    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 [place], [place], [address] Phone: [phone]   Fax:  [phone]  Name: Maurice Little MRN: [id number] Date of Birth: Nov 14, 1955  Willa Frater, OTR/L [facility] [address] [place], [place]  [address] [phone] phone [phone] 10/16/21 9:45 AM

## 2021-10-16 NOTE — Telephone Encounter (Signed)
[facility] called and stated the patient is requesting refill and they cannot fill them because he is not established with [place] yet. His new patient appointment with them is 11/06/21. It looks like the refills were sent to [place] and we refused them to PCP office. Please advise.

## 2021-10-16 NOTE — Telephone Encounter (Signed)
Received a call from patients wife regarding medication refills I explained to her that the patient is not an established patient with our office therefore we cannot authorize any medications for him I instructed her to contact Dr. Wynn Banker to see if his office would authorize, she states the RX were sent there originally and were denied I contacted Dr. Doroteo Bradford and he is agreeable to authorizing a 1 month supply of medication for the patient until he completes his establish care/new patient appt with our office on 01/10 Patients wife is aware and agreeable and thanked me for all the assistance.

## 2021-10-17 ENCOUNTER — Encounter: Payer: Self-pay | Admitting: Occupational Therapy

## 2021-10-17 NOTE — Therapy (Signed)
[facility] [address] [place], [place], [address] Phone: [phone]   Fax:  [phone]  Physical Therapy Evaluation  Patient Details  Name: Maurice Little MRN: [id number] Date of Birth: 01-04-1956 Referring Provider (PT): Mariam Dollar, PA-C   Encounter Date: 10/16/2021   PT End of Session - 10/17/21 1752     Visit Number 1    Number of Visits 25    Date for PT Re-Evaluation 01/11/22    Authorization Type UHC ([place] with no auth required)    PT Start Time 0802    PT Stop Time 0846    PT Time Calculation (min) 44 min    Equipment Utilized During Treatment Gait belt    Activity Tolerance Patient tolerated treatment well    Behavior During Therapy WFL for tasks assessed/performed             Past Medical History:  Diagnosis Date   Benign prostatic hyperplasia with elevated prostate specific antigen (PSA)    Hypertension    Iliac artery stenosis, left (HCC)    with claudication resolved with stent   PAD (peripheral artery disease) (HCC)     Past Surgical History:  Procedure Laterality Date   BUBBLE STUDY  08/31/2021   Procedure: BUBBLE STUDY;  Surgeon: Chilton Si, Maurice;  Location: [facility] ENDOSCOPY;  Service: Cardiovascular;;   ILIAC ARTERY STENT Left    IR ANGIO VERTEBRAL SEL SUBCLAVIAN INNOMINATE UNI L MOD SED  08/30/2021   IR ANGIO VERTEBRAL SEL SUBCLAVIAN INNOMINATE UNI R MOD SED  08/30/2021   IR CT HEAD LTD  08/30/2021   IR INTRA CRAN STENT  08/30/2021   IR US GUIDE VASC ACCESS RIGHT  08/30/2021   RADIOLOGY WITH ANESTHESIA N/A 08/30/2021   Procedure: IR WITH ANESTHESIA;  Surgeon: Julieanne Cotton, Maurice;  Location: [facility] OR;  Service: Radiology;  Laterality: N/A;   TEE WITHOUT CARDIOVERSION N/A 08/31/2021   Procedure: TRANSESOPHAGEAL ECHOCARDIOGRAM (TEE);  Surgeon: Chilton Si, Maurice;  Location: [facility] ENDOSCOPY;  Service: Cardiovascular;  Laterality: N/A;    There were no vitals filed for this  visit.        [facility] PT Assessment - 10/17/21 0001       Assessment   Medical Diagnosis Cerebellar CVA    Referring Provider (PT) Mariam Dollar, PA-C    Onset Date/Surgical Date 08/29/21    Hand Dominance Right    Next Maurice Visit 10-16-21    Prior Therapy CIR - 09-04-21 - 09-27-21      Precautions   Precautions Fall    Precaution Comments no driving, ataxia, diplopia, decr hearing (particularly R side)      Balance Screen   Has the patient fallen in the past 6 months No    Has the patient had a decrease in activity level because of a fear of falling?  Yes    Is the patient reluctant to leave their home because of a fear of falling?  No      Home Environment   Living Environment Private residence    Type of Home House    Home Access Stairs to enter    Entrance Stairs-Number of Steps 3    Entrance Stairs-Rails None    Home Layout One level    Home Equipment Wheelchair - manual;Bedside commode;Walker - 2 wheels;Grab bars - tub/shower;Tub bench   does not use BSC   Additional Comments also has bed rail but has not installed it yet      Prior Function  Level of Independence Independent    Vocation Full time employment   IT work   Leisure go to [employer], ride motorcycles, play golf      Observation/Other Assessments   Focus on Therapeutic Outcomes (FOTO)  not captured by front office      ROM / Strength   AROM / PROM / Strength AROM;Strength      AROM   Overall AROM  Within functional limits for tasks performed      Strength   Overall Strength Within functional limits for tasks performed    Overall Strength Comments Bil. LE's WNL's      Transfers   Transfers Sit to Stand;Stand to Sit;Squat Pivot Transfers    Sit to Stand 4: Min guard    Stand to Sit 5: Supervision    Squat Pivot Transfers 5: Supervision   wheelchair to/from mat   Comments stabilizes with legs against mat table with stand to sit transfers; needs UE support upon initial sit to stand; has WBOS with  sit to stand transfers      Ambulation/Gait   Ambulation/Gait Yes    Ambulation/Gait Assistance 4: Min assist    Ambulation Distance (Feet) 75 Feet    Assistive device Rolling walker    Gait Pattern Step-through pattern;Ataxic    Ambulation Surface Level;Indoor    Gait velocity 1' 16 secs = .43 ft/sec with RW    Stairs No      Standardized Balance Assessment   Standardized Balance Assessment Timed Up and Go Test      Berg Balance Test   Sit to Stand Able to stand  independently using hands    Standing Unsupported Able to stand 2 minutes with supervision    Sitting with Back Unsupported but Feet Supported on Floor or Stool Able to sit safely and securely 2 minutes    Stand to Sit Uses backs of legs against chair to control descent    Transfers Able to transfer with verbal cueing and /or supervision    Standing Unsupported with Eyes Closed Able to stand 10 seconds with supervision    Standing Unsupported with Feet Together Needs help to attain position but able to stand for 30 seconds with feet together    From Standing, Reach Forward with Outstretched Arm Can reach forward >5 cm safely (2")    From Standing Position, Pick up Object from Floor Unable to pick up shoe, but reaches 2-5 cm (1-2") from shoe and balances independently   able to pick up object   From Standing Position, Turn to Look Behind Over each Shoulder Turn sideways only but maintains balance    Turn 360 Degrees Needs assistance while turning    Standing Unsupported, Alternately Place Feet on Step/Stool Able to complete >2 steps/needs minimal assist    Standing Unsupported, One Foot in Front Needs help to step but can hold 15 seconds    Standing on One Leg Tries to lift leg/unable to hold 3 seconds but remains standing independently    Total Score 27      Timed Up and Go Test   TUG Normal TUG    Normal TUG (seconds) 1.07   with RW with min assist                       Objective measurements completed  on examination: See above findings.                PT Education - 10/17/21 1750  Education Details recommended to pt and wife that pt begin static standing at counter with as less UE support as possible; can begin weight shifts forward/back and laterally if he feels comfortable (wife to supervise for safety)    Person(s) Educated Patient;Spouse    Methods Explanation;Demonstration    Comprehension Verbalized understanding              PT Short Term Goals - 10/17/21 1820       PT SHORT TERM GOAL #1   Title Pt will perform basic transfers wheelchair to/from mat using squat pivot transfer with supervision.    Baseline CGA    Time 4    Period Weeks    Status New    Target Date 11/16/21      PT SHORT TERM GOAL #2   Title Pt will improve Berg score from 27/56 to >/= 32/56 to demo improved balance with reduced fall risk.    Baseline 27/56    Time 4    Period Weeks    Status New    Target Date 11/16/21      PT SHORT TERM GOAL #3   Title Improve TUG score from 1".07 secs to </= 50 secs with RW with CGA.    Baseline 1" 7 secs with RW    Time 4    Period Weeks    Status New    Target Date 11/16/21      PT SHORT TERM GOAL #4   Title Perform sit to stand from mat to RW with SBA with normal BOS.    Time 4    Period Weeks    Status New    Target Date 11/16/21      PT SHORT TERM GOAL #5   Title Amb. 49' with RW with CGA on flat, even surface.    Time 4    Period Weeks    Status New    Target Date 11/16/21      Additional Short Term Goals   Additional Short Term Goals Yes      PT SHORT TERM GOAL #6   Title Perform HEP for standing balance with wife's assistance.    Time 4    Period Weeks    Status New    Target Date 11/16/21               PT Long Term Goals - 10/17/21 1829       PT LONG TERM GOAL #1   Title Pt will be modified independent with household amb. without use of assistive device.    Time 12    Period Weeks    Status New     Target Date 01/11/22      PT LONG TERM GOAL #2   Title Pt will amb. 1000' with SPC with SBA on flat even and uneven surfaces for increased community accessibility.    Time 12    Period Weeks    Status New    Target Date 01/11/22      PT LONG TERM GOAL #3   Title Improve TUG score to </= 25 secs with appropriate assistive device to demo improved functional mobility and to reduce fall risk.    Baseline 1".07 secs with RW - 10-16-21    Time 12    Period Weeks    Status New    Target Date 01/11/22      PT LONG TERM GOAL #4   Title Increase Berg score to >/= 45/56 to reduce fall  risk.    Baseline 27/56    Time 12    Period Weeks    Status New    Target Date 01/11/22      PT LONG TERM GOAL #5   Title Increase gait velocity from .43 ft/sec to >/= 1.6 ft/sec with RW for increased gait efficiency.    Time 12    Period Weeks    Status New    Target Date 01/11/22      Additional Long Term Goals   Additional Long Term Goals Yes      PT LONG TERM GOAL #6   Title Independent in updated HEP as appropriate.    Time 12    Period Weeks    Status New    Target Date 01/11/22                    Plan - 10/17/21 1755     Clinical Impression Statement Pt is a 65 yr old gentleman s/p Rt cerebellar infarct on 08-29-21.  Pt presented to ED with dizziness and headache; was admitted to [facility] hospital 08-29-21 and transferred to inpatient rehab on 09-04-21 with discharge home on 09-27-21.  Pt underwent a cerebral angiogram with stent on 08-31-21.  Pt presents with c/o dizziness, diplopia, ataxic gait pattern, decreased static & dynamic standing balance, and has an indwelling catheter due to neurogenic bladder. He is currently dependent with transfers, mobility and ADL's.  Pt is at very high fall risk per TUG score of 1" 7 secs with use of RW (high fall risk as score > 13.5 secs);  his gait velocity (.43 ft/sec)  is very slow due to ataxic gait pattern and imbalance.  Pt will benefit from PT to  address gait, balance, coordination deficits, and ataxia.    Personal Factors and Comorbidities Comorbidity 2;Profession    Comorbidities basilar artery stenosis, dyslipidemia,  HTN, neurogenic bladder, diplopia, anemia, Lt iliac artery stenosis, PAD    Examination-Activity Limitations Bathing;Carry;Continence;Toileting;Dressing;Lift;Stand;Stairs;Squat;Reach Overhead;Locomotion Level;Transfers    Examination-Participation Restrictions Cleaning;Community Activity;Driving;Interpersonal Relationship;Laundry;Shop;Occupation;Yard Work;Meal Prep    Stability/Clinical Decision Making Evolving/Moderate complexity    Clinical Decision Making Moderate    Rehab Potential Good    PT Frequency 2x / week    PT Duration 12 weeks    PT Treatment/Interventions ADLs/Self Care Home Management;Aquatic Therapy;DME Instruction;Gait training;Stair training;Therapeutic activities;Therapeutic exercise;Balance training;Neuromuscular re-education;Patient/family education;Vestibular    PT Next Visit Plan begin HEP for balance - include sit to stand, standing at counter with weight shifting, marching in place, stepping strategy if pt able to perform safely with wife's assistance; gait train with RW    Recommended Other Services pt is receiving OT also    Consulted and Agree with Plan of Care Patient;Family member/caregiver    Family Member Consulted wife, [name]             Patient will benefit from skilled therapeutic intervention in order to improve the following deficits and impairments:  Abnormal gait, Decreased activity tolerance, Decreased balance, Decreased coordination, Dizziness, Impaired sensation, Impaired UE functional use, Impaired vision/preception  Visit Diagnosis: Ataxic gait - Plan: PT plan of care cert/re-cert  Other abnormalities of gait and mobility - Plan: PT plan of care cert/re-cert  Other lack of coordination - Plan: PT plan of care cert/re-cert  Unsteadiness on feet - Plan: PT plan of care  cert/re-cert  Other symptoms and signs involving the nervous system - Plan: PT plan of care cert/re-cert     Problem List Patient Active Problem List  Diagnosis Date Noted   Urinary retention 09/27/2021   Diplopia 09/27/2021   Carotid stenosis, right 09/27/2021   Anemia 09/27/2021   Dyslipidemia    Slow transit constipation    Benign essential HTN    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   Acute ischemic stroke [facility])    Cerebral embolism with cerebral infarction 08/30/2021   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 [place], [place], [address] Phone: [phone]   Fax:  [phone]  Name: Maurice Little MRN: [id number] Date of Birth: Sep 16, 1956

## 2021-10-23 ENCOUNTER — Ambulatory Visit: Payer: 59 | Admitting: Occupational Therapy

## 2021-10-23 ENCOUNTER — Other Ambulatory Visit: Payer: Self-pay

## 2021-10-23 ENCOUNTER — Encounter: Payer: Self-pay | Admitting: Occupational Therapy

## 2021-10-23 DIAGNOSIS — R270 Ataxia, unspecified: Secondary | ICD-10-CM

## 2021-10-23 DIAGNOSIS — R2681 Unsteadiness on feet: Secondary | ICD-10-CM

## 2021-10-23 DIAGNOSIS — R29818 Other symptoms and signs involving the nervous system: Secondary | ICD-10-CM

## 2021-10-23 DIAGNOSIS — R208 Other disturbances of skin sensation: Secondary | ICD-10-CM

## 2021-10-23 DIAGNOSIS — R278 Other lack of coordination: Secondary | ICD-10-CM | POA: Diagnosis not present

## 2021-10-23 DIAGNOSIS — R41842 Visuospatial deficit: Secondary | ICD-10-CM

## 2021-10-23 NOTE — Therapy (Signed)
[facility] [address] [place], [place], [address] Phone: [phone]   Fax:  [phone]  Occupational Therapy Treatment  Patient Details  Name: Maurice Little MRN: [id number] Date of Birth: [birth year] Referring Provider (OT): Mariam Dollar, PA-C   Encounter Date: 10/23/2021   OT End of Session - 10/23/21 0934     Visit Number 5    Number of Visits 25    Date for OT Re-Evaluation 12/31/21    Authorization Type UHC--awaiting insurance verfication    OT Start Time 0935    OT Stop Time 1019    OT Time Calculation (min) 44 min    Activity Tolerance Patient tolerated treatment well    Behavior During Therapy WFL for tasks assessed/performed             Past Medical History:  Diagnosis Date   Benign prostatic hyperplasia with elevated prostate specific antigen (PSA)    Hypertension    Iliac artery stenosis, left (HCC)    with claudication resolved with stent   PAD (peripheral artery disease) (HCC)     Past Surgical History:  Procedure Laterality Date   BUBBLE STUDY  08/31/2021   Procedure: BUBBLE STUDY;  Surgeon: Chilton Si, MD;  Location: [facility] ENDOSCOPY;  Service: Cardiovascular;;   ILIAC ARTERY STENT Left    IR ANGIO VERTEBRAL SEL SUBCLAVIAN INNOMINATE UNI L MOD SED  08/30/2021   IR ANGIO VERTEBRAL SEL SUBCLAVIAN INNOMINATE UNI R MOD SED  08/30/2021   IR CT HEAD LTD  08/30/2021   IR INTRA CRAN STENT  08/30/2021   IR US GUIDE VASC ACCESS RIGHT  08/30/2021   RADIOLOGY WITH ANESTHESIA N/A 08/30/2021   Procedure: IR WITH ANESTHESIA;  Surgeon: Julieanne Cotton, MD;  Location: [facility] OR;  Service: Radiology;  Laterality: N/A;   TEE WITHOUT CARDIOVERSION N/A 08/31/2021   Procedure: TRANSESOPHAGEAL ECHOCARDIOGRAM (TEE);  Surgeon: Chilton Si, MD;  Location: [facility] ENDOSCOPY;  Service: Cardiovascular;  Laterality: N/A;    There were no vitals filed for this visit.   Subjective Assessment - 10/23/21 0931      Subjective  Pt reports L shoulder is sore with certain movemens, R 2nd digit DIP sore, R wrist sore, and L 3rd toe swollen--all just mildly sore intermittently with use.  Pt reports dizziness (? more off balance vs. room spinning) incr with more intense exercise but not necessarily head/eye movements.  Pt reports that vision in R eye looks cloudy.    Patient is accompanied by: Family member    Pertinent History CVA (R cereballar and R small R brachium potis and R pontomedullary junction).   PMH:  dyslipidemia, HTN, carotid stenosis, anemia, PAD    Limitations ataxia, diplopia, folley catheter, decr hearing (particularly R side)    Patient Stated Goals incr independence, get better    Currently in Pain? Yes    Pain Score --   mildly sore   Pain Location --   L shoulder, R 2nd digit, R wrist, L 3rd toe   Pain Descriptors / Indicators Aching;Discomfort    Pain Type Acute pain    Pain Onset In the past 7 days    Pain Frequency Intermittent    Aggravating Factors  intermittent with activity and certain movements    Pain Relieving Factors rest               Reviewed HEP issued last session (closed-chain shoulder exercises, wt. Bearing through R hand).  Pt returned demo with min cueing.  Sitting,  functional reaching with RUE to place large pegs in vertical pegboard with min cueing for shoulder hike and coordination.  Placing small pegs in pegboard to copy design and for R hand coordination with min difficulty with coordination and 1 v.c. initially for copying.  Removing using in-hand manipulation to remove 3 and then place in container 1 at  a time with min difficulty/incr time.  Pt demo difficulty dividing attention throughout session, and has to stop activity for conversation.    Transfer w/c>mat and mat>w/c with close supervision, pt with decr control transferring to R side vs. L.        OT Short Term Goals - 10/02/21 1535       OT SHORT TERM GOAL #1   Title Pt will be  independent with initial HEP.--check STGs 11/02/21    Time 4    Period Weeks    Status New      OT SHORT TERM GOAL #2   Title Pt will perform BADLs mod I except supervision for shower transfer.    Time 4    Period Weeks    Status New      OT SHORT TERM GOAL #3   Title Pt will perform tabletop visual scanning with 90% accuracy and no reports of diplopia.    Time 4    Period Weeks    Status New      OT SHORT TERM GOAL #4   Title Pt will verbalize understanding of visual compensation strategies for ADLs/IADLs prn (including ?taping, head turns/positioning)    Time 4    Period Weeks    Status New      OT SHORT TERM GOAL #5   Title Pt will improve coordination for ADLs as shown by improving time on 9-hole peg test by at least 5sec bilaterally.    Baseline R-66.85sec, L-41.25sec    Time 4    Period Weeks    Status New               OT Long Term Goals - 10/02/21 1539       OT LONG TERM GOAL #1   Title Pt will be independent with updated HEP.    Time 12    Period Weeks    Status New      OT LONG TERM GOAL #2   Title Pt will perform simple snack prep/home maintenance task with supervision.    Time 12    Period Weeks    Status New      OT LONG TERM GOAL #3   Title Pt will perform environmental scanning/navigation with at least 90% accuracy for incr safety in the home/community.    Time 12    Period Weeks    Status New      OT LONG TERM GOAL #4   Title Pt will improve RUE coordination as shown by improving time on 9-hole peg test by at least 15sec.    Baseline 66.85sec    Time 12    Period Weeks    Status New      OT LONG TERM GOAL #5   Title Pt will be able to retrieve/replace 1-2 objects on overhead shelf with each UE x10 safetly and with good control for IADL tasks.    Time 12    Period Weeks    Status New                   Plan - 10/23/21 0934     Clinical  Impression Statement Pt is progressing towards goals with improving RUE coordination  and responds well to cueing for compensation patterns.  Pt reports no diplopia with tabletop scanning today.  Pt demo difficulty with alternating/divided attention during functional activity.    OT Occupational Profile and History Detailed Assessment- Review of Records and additional review of physical, cognitive, psychosocial history related to current functional performance    Occupational performance deficits (Please refer to evaluation for details): ADL's;IADL's;Work;Leisure    Body Structure / Function / Physical Skills ADL;UE functional use;Balance;FMC;Coordination;Sensation;IADL;Decreased knowledge of use of DME;Dexterity;Vision    Rehab Potential Good    Clinical Decision Making Several treatment options, min-mod task modification necessary    Comorbidities Affecting Occupational Performance: May have comorbidities impacting occupational performance    Modification or Assistance to Complete Evaluation  Min-Moderate modification of tasks or assist with assess necessary to complete eval    OT Frequency 2x / week    OT Duration 12 weeks   +eval   OT Treatment/Interventions Self-care/ADL training;Energy conservation;Ultrasound;Visual/perceptual remediation/compensation;Patient/family education;DME and/or AE instruction;Aquatic Therapy;Paraffin;Passive range of motion;Balance training;Cryotherapy;Functional Mobility Training;Moist Heat;Therapeutic exercise;Manual Therapy;Therapeutic activities;Neuromuscular education    Plan continue with neuro re-ed, coordination, environmental scanning with RW and ?visual compensation strategies prn    OT Home Exercise Plan Pt reports that he has some coordination activities and visual HEP from hospital    Consulted and Agree with Plan of Care Patient;Family member/caregiver    Family Member Consulted wife             Patient will benefit from skilled therapeutic intervention in order to improve the following deficits and impairments:   Body Structure /  Function / Physical Skills: ADL, UE functional use, Balance, FMC, Coordination, Sensation, IADL, Decreased knowledge of use of DME, Dexterity, Vision       Visit Diagnosis: Other lack of coordination  Visuospatial deficit  Other disturbances of skin sensation  Unsteadiness on feet  Ataxia  Other symptoms and signs involving the nervous system    Problem List Patient Active Problem List   Diagnosis Date Noted   Urinary retention 09/27/2021   Diplopia 09/27/2021   Carotid stenosis, right 09/27/2021   Anemia 09/27/2021   Dyslipidemia    Slow transit constipation    Benign essential HTN    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   Acute ischemic stroke [facility])    Cerebral embolism with cerebral infarction 08/30/2021   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 [place], [place], [address] Phone: [phone]   Fax:  [phone]  Name: Wilmore Holsomback MRN: [id number] Date of Birth: [birth year]  Willa Frater, OTR/L [facility] [address] [place], [place]  [address] [phone] phone [phone] 10/23/21 10:55 AM

## 2021-10-25 ENCOUNTER — Ambulatory Visit: Payer: 59 | Admitting: Physical Therapy

## 2021-10-25 ENCOUNTER — Encounter: Payer: Self-pay | Admitting: Occupational Therapy

## 2021-10-25 ENCOUNTER — Encounter: Payer: Self-pay | Admitting: Physical Therapy

## 2021-10-25 ENCOUNTER — Other Ambulatory Visit: Payer: Self-pay

## 2021-10-25 ENCOUNTER — Ambulatory Visit: Payer: 59 | Admitting: Occupational Therapy

## 2021-10-25 DIAGNOSIS — R278 Other lack of coordination: Secondary | ICD-10-CM | POA: Diagnosis not present

## 2021-10-25 DIAGNOSIS — R2681 Unsteadiness on feet: Secondary | ICD-10-CM

## 2021-10-25 DIAGNOSIS — R29818 Other symptoms and signs involving the nervous system: Secondary | ICD-10-CM

## 2021-10-25 DIAGNOSIS — R270 Ataxia, unspecified: Secondary | ICD-10-CM

## 2021-10-25 DIAGNOSIS — R208 Other disturbances of skin sensation: Secondary | ICD-10-CM

## 2021-10-25 DIAGNOSIS — R41842 Visuospatial deficit: Secondary | ICD-10-CM

## 2021-10-25 NOTE — Therapy (Signed)
[facility] Out[facility] Rehabilitation [facility] [address] [place], [place], [address] Phone: [phone]   Fax:  [phone]  Physical Therapy Treatment  Patient Details  Name: Maurice Little MRN: [id number] Date of Birth: Jul 19, 1956 Referring Provider (PT): Mariam Dollar, PA-C   Encounter Date: 10/25/2021   PT End of Session - 10/25/21 0934     Visit Number 2    Number of Visits 25    Date for PT Re-Evaluation 01/11/22    Authorization Type UHC ([place] with no auth required)    PT Start Time 0932    PT Stop Time 1015    PT Time Calculation (min) 43 min    Equipment Utilized During Treatment Gait belt    Activity Tolerance Patient tolerated treatment well;Patient limited by pain   limited by pain from bladder spasms   Behavior During Therapy [facility] for tasks assessed/performed             Past Medical History:  Diagnosis Date   Benign prostatic hyperplasia with elevated prostate specific antigen (PSA)    Hypertension    Iliac artery stenosis, left (HCC)    with claudication resolved with stent   PAD (peripheral artery disease) (HCC)     Past Surgical History:  Procedure Laterality Date   BUBBLE STUDY  08/31/2021   Procedure: BUBBLE STUDY;  Surgeon: Chilton Si, MD;  Location: [facility] ENDOSCOPY;  Service: Cardiovascular;;   ILIAC ARTERY STENT Left    IR ANGIO VERTEBRAL SEL SUBCLAVIAN INNOMINATE UNI L MOD SED  08/30/2021   IR ANGIO VERTEBRAL SEL SUBCLAVIAN INNOMINATE UNI R MOD SED  08/30/2021   IR CT HEAD LTD  08/30/2021   IR INTRA CRAN STENT  08/30/2021   IR US GUIDE VASC ACCESS RIGHT  08/30/2021   RADIOLOGY WITH ANESTHESIA N/A 08/30/2021   Procedure: IR WITH ANESTHESIA;  Surgeon: Julieanne Cotton, MD;  [facility] OR;  Service: Radiology;  Laterality: N/A;   TEE WITHOUT CARDIOVERSION N/A 08/31/2021   Procedure: TRANSESOPHAGEAL ECHOCARDIOGRAM (TEE);  Surgeon: Chilton Si, MD;  Location: [facility] ENDOSCOPY;  Service: Cardiovascular;   Laterality: N/A;    There were no vitals filed for this visit.   Subjective Assessment - 10/25/21 0937     Subjective Bladder spasms have been keeping him up at night. Going in for labs later. No falls. Has been walking at home with his RW with his wife and has been doing some exercises at the countertop. Whole L side is numb. Reports his 2/3rd toe have been feeling a little sore.    Patient is accompained by: Family member   Maurice Little (wife)   Pertinent History Basilar artery stenosis, urinary retention with indwelling catheter, neurogenic bladder, diplopia, Rt cerebellar infarct, HTN, Rt carotid stenosis, anemia, s/p cerebral angiogram with stent    Limitations Lifting;Standing;Walking;House hold activities;Reading    Patient Stated Goals "I want to be normal again - walk without walker", decrease numbness on Rt side of body    Currently in Pain? Yes   soreness in his 2nd and 3rd toes, intense bladder spasms               [facility] PT Assessment - 10/25/21 1025       Sensation   Additional Comments per pt reports, numbness on entire L side (feels like he is wearing a suit of armor)                           [facility] Adult  PT Treatment/Exercise - 10/25/21 1026       Transfers   Transfers Sit to Stand;Stand to Sit;Squat Pivot Transfers;Stand Pivot Transfers    Sit to Stand 4: Min guard    Stand to Sit 5: Supervision    Stand Pivot Transfers 4: Min guard   From w/c > mat table with use of RW   Comments Performed x8 reps total sit <> stands from mat table with cues to scoot out towards edge, incr forward lean/nose over toes to decr BLE bracing against mat when standing, has single UE on mat and other on RW. Pt with controlled descent back to mat table with use of hands.      Ambulation/Gait   Ambulation/Gait Yes    Ambulation/Gait Assistance 4: Min guard    Ambulation Distance (Feet) 80 Feet   x1   Assistive device Rolling walker    Gait Pattern Step-through  pattern;Ataxic;Decreased weight shift to right;Decreased weight shift to left;Wide base of support    Ambulation Surface Level;Indoor    Gait Comments W/c follow from spouse, cues during gait for incr stance time R>L and step length. Pt ambulates with a wide BOS             Access Code: [email or web address] URL: [email or web address] Date: 10/25/2021 Prepared by: Sherlie Ban  Initiated HEP for functional transfers/strength and standing balance at countertop with decr UE support with wife supervision. Performed with w/c posteriorly.   Exercises Lateral Weight Shift - 2 x daily - 5 x weekly - 2 sets - 10 reps - cues for proper technique and decr UE support  Standing Anterior Posterior Weight Shift with Chair - 2 x daily - 5 x weekly - 2 sets - 10 reps - into ball of foot and then heel with cues to keep foot planted on the ground  Mini Squat with Counter Support - 2 x daily - 5 x weekly - 1-2 sets - 10 reps Standing Balance with Eyes Closed - 2 x daily - 5 x weekly - 3 sets - 10-15 hold -without UE support Sit to Stand with Armchair - 2 x daily - 5 x weekly - 2 sets - 5 reps           PT Education - 10/25/21 1023     Education Details Initial HEP for sit <> stands, standing balance at countertop (pt also doing side stepping at countertop from rehab).    Person(s) Educated Patient;Spouse    Methods Explanation;Demonstration;Tactile cues;Handout    Comprehension Returned demonstration;Verbalized understanding              PT Short Term Goals - 10/17/21 1820       PT SHORT TERM GOAL #1   Title Pt will perform basic transfers wheelchair to/from mat using squat pivot transfer with supervision.    Baseline CGA    Time 4    Period Weeks    Status New    Target Date 11/16/21      PT SHORT TERM GOAL #2   Title Pt will improve Berg score from 27/56 to >/= 32/56 to demo improved balance with reduced fall risk.    Baseline 27/56    Time 4    Period Weeks    Status  New    Target Date 11/16/21      PT SHORT TERM GOAL #3   Title Improve TUG score from 1".07 secs to </= 50 secs with RW with CGA.    Baseline 1" 7 secs  with RW    Time 4    Period Weeks    Status New    Target Date 11/16/21      PT SHORT TERM GOAL #4   Title Perform sit to stand from mat to RW with SBA with normal BOS.    Time 4    Period Weeks    Status New    Target Date 11/16/21      PT SHORT TERM GOAL #5   Title Amb. 32' with RW with CGA on flat, even surface.    Time 4    Period Weeks    Status New    Target Date 11/16/21      Additional Short Term Goals   Additional Short Term Goals Yes      PT SHORT TERM GOAL #6   Title Perform HEP for standing balance with wife's assistance.    Time 4    Period Weeks    Status New    Target Date 11/16/21               PT Long Term Goals - 10/17/21 1829       PT LONG TERM GOAL #1   Title Pt will be modified independent with household amb. without use of assistive device.    Time 12    Period Weeks    Status New    Target Date 01/11/22      PT LONG TERM GOAL #2   Title Pt will amb. 1000' with SPC with SBA on flat even and uneven surfaces for increased community accessibility.    Time 12    Period Weeks    Status New    Target Date 01/11/22      PT LONG TERM GOAL #3   Title Improve TUG score to </= 25 secs with appropriate assistive device to demo improved functional mobility and to reduce fall risk.    Baseline 1".07 secs with RW - 10-16-21    Time 12    Period Weeks    Status New    Target Date 01/11/22      PT LONG TERM GOAL #4   Title Increase Berg score to >/= 45/56 to reduce fall risk.    Baseline 27/56    Time 12    Period Weeks    Status New    Target Date 01/11/22      PT LONG TERM GOAL #5   Title Increase gait velocity from .43 ft/sec to >/= 1.6 ft/sec with RW for increased gait efficiency.    Time 12    Period Weeks    Status New    Target Date 01/11/22      Additional Long Term Goals    Additional Long Term Goals Yes      PT LONG TERM GOAL #6   Title Independent in updated HEP as appropriate.    Time 12    Period Weeks    Status New    Target Date 01/11/22                   Plan - 10/25/21 1032     Clinical Impression Statement Pt limited during today's session due to bladder spasms, needing intermittent breaks for pain/spasms to subside to continue activity. Going to the doctor later today for a culture and has a follow up visit next week. Worked on gait training today with RW and with cues for incr stance time to incr step length. Initiated pt's HEP  for sit <> stands and standing balance at countertop working on decr UE support and with wife supervision.  Will continue to progress towards LTGs.    Personal Factors and Comorbidities Comorbidity 2;Profession    Comorbidities basilar artery stenosis, dyslipidemia,  HTN, neurogenic bladder, diplopia, anemia, Lt iliac artery stenosis, PAD    Examination-Activity Limitations Bathing;Carry;Continence;Toileting;Dressing;Lift;Stand;Stairs;Squat;Reach Overhead;Locomotion Level;Transfers    Examination-Participation Restrictions Cleaning;Community Activity;Driving;Interpersonal Relationship;Laundry;Shop;Occupation;Yard Work;Meal Prep    Stability/Clinical Decision Making Evolving/Moderate complexity    Rehab Potential Good    PT Frequency 2x / week    PT Duration 12 weeks    PT Treatment/Interventions ADLs/Self Care Home Management;Aquatic Therapy;DME Instruction;Gait training;Stair training;Therapeutic activities;Therapeutic exercise;Balance training;Neuromuscular re-education;Patient/family education;Vestibular    PT Next Visit Plan add to HEP for balance - marching, stepping strategy if pt able to perform safely with wife's assistance; gait train with RW. Standing weight shifting, balance with decr UE support. Weight bearing activities for ataxia.    PT Home Exercise Plan [email or web address]    Consulted and Agree with Plan of  Care Patient;Family member/caregiver    Family Member Consulted wife, Maurice Little             Patient will benefit from skilled therapeutic intervention in order to improve the following deficits and impairments:  Abnormal gait, Decreased activity tolerance, Decreased balance, Decreased coordination, Dizziness, Impaired sensation, Impaired UE functional use, Impaired vision/preception  Visit Diagnosis: Other lack of coordination  Unsteadiness on feet  Other symptoms and signs involving the nervous system     Problem List Patient Active Problem List   Diagnosis Date Noted   Urinary retention 09/27/2021   Diplopia 09/27/2021   Carotid stenosis, right 09/27/2021   Anemia 09/27/2021   Dyslipidemia    Slow transit constipation    Benign essential HTN    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

## 2021-10-25 NOTE — Patient Instructions (Signed)
Access Code: [email or web address] URL: [email or web address] Date: 10/25/2021 Prepared by: Sherlie Ban  Exercises Lateral Weight Shift - 2 x daily - 5 x weekly - 2 sets - 10 reps Standing Anterior Posterior Weight Shift with Chair - 2 x daily - 5 x weekly - 2 sets - 10 reps Mini Squat with Counter Support - 2 x daily - 5 x weekly - 1-2 sets - 10 reps Standing Balance with Eyes Closed - 2 x daily - 5 x weekly - 3 sets - 10-15 hold Sit to Stand with Armchair - 2 x daily - 5 x weekly - 2 sets - 5 reps

## 2021-10-25 NOTE — Therapy (Signed)
[facility] Rehabilitation [facility] [address] [place], [place], [address] Phone: [phone]   Fax:  [phone]  Occupational Therapy Treatment  Patient Details  Name: Maurice Little MRN: [id number] Date of Birth: October 06, 1956 Referring Provider (OT): Mariam Dollar, PA-C   Encounter Date: 10/25/2021   OT End of Session - 10/25/21 1042     Visit Number 6    Number of Visits 25    Date for OT Re-Evaluation 12/31/21    Authorization Type UHC--awaiting insurance verfication    OT Start Time 1017    OT Stop Time 1055    OT Time Calculation (min) 38 min    Activity Tolerance Patient tolerated treatment well    Behavior During Therapy WFL for tasks assessed/performed             Past Medical History:  Diagnosis Date   Benign prostatic hyperplasia with elevated prostate specific antigen (PSA)    Hypertension    Iliac artery stenosis, left (HCC)    with claudication resolved with stent   PAD (peripheral artery disease) (HCC)     Past Surgical History:  Procedure Laterality Date   BUBBLE STUDY  08/31/2021   Procedure: BUBBLE STUDY;  Surgeon: Chilton Si, MD;  Location: [facility] ENDOSCOPY;  Service: Cardiovascular;;   ILIAC ARTERY STENT Left    IR ANGIO VERTEBRAL SEL SUBCLAVIAN INNOMINATE UNI L MOD SED  08/30/2021   IR ANGIO VERTEBRAL SEL SUBCLAVIAN INNOMINATE UNI R MOD SED  08/30/2021   IR CT HEAD LTD  08/30/2021   IR INTRA CRAN STENT  08/30/2021   IR US GUIDE VASC ACCESS RIGHT  08/30/2021   RADIOLOGY WITH ANESTHESIA N/A 08/30/2021   Procedure: IR WITH ANESTHESIA;  Surgeon: Julieanne Cotton, MD;  Location: [facility] OR;  Service: Radiology;  Laterality: N/A;   TEE WITHOUT CARDIOVERSION N/A 08/31/2021   Procedure: TRANSESOPHAGEAL ECHOCARDIOGRAM (TEE);  Surgeon: Chilton Si, MD;  Location: [facility] ENDOSCOPY;  Service: Cardiovascular;  Laterality: N/A;    There were no vitals filed for this visit.   Subjective Assessment - 10/25/21 1022      Subjective  Pt reports L shoulder is sore with certain movemens, R 2nd digit DIP sore, R wrist sore, and L 3rd toe swollen--all just mildly sore intermittently with use.  Pt reports dizziness (? more off balance vs. room spinning) incr with more intense exercise but not necessarily head/eye movements.  Pt reports that vision in R eye looks cloudy.    Pertinent History CVA (R cereballar and R small R brachium potis and R pontomedullary junction).   PMH:  dyslipidemia, HTN, carotid stenosis, anemia, PAD    Limitations ataxia, diplopia, folley catheter, decr hearing (particularly R side)    Patient Stated Goals incr independence, get better    Currently in Pain? Yes    Pain Score 10-Worst pain ever    Pain Location Bladder    Pain Descriptors / Indicators Aching    Pain Type Acute pain    Pain Onset In the past 7 days    Pain Frequency Intermittent    Aggravating Factors  activity    Pain Relieving Factors rest                        Treatment: Placing grooved pegs into pegboard with RUE for increased fine motor coordination min v.c  for positioning, removing pegs with in hand manipulation, min difficulty/ v.c. Then placing grooved pegs with LUE due to to impaired sensation, min  difficulty, removing with in hand manipulation           OT Education - 10/25/21 1029     Education Details Reviewed HEP for closed-chain shoulder movements and wt. bearing with body on arm movements    Person(s) Educated Patient;Spouse    Methods Explanation;Demonstration;Handout;Verbal cues;Tactile cues    Comprehension Verbalized understanding;Returned demonstration;Verbal cues required              OT Short Term Goals - 10/25/21 1046       OT SHORT TERM GOAL #1   Title Pt will be independent with initial HEP.--check STGs 11/02/21    Time 4    Period Weeks    Status On-going      OT SHORT TERM GOAL #2   Title Pt will perform BADLs mod I except supervision for shower  transfer.    Time 4    Period Weeks    Status On-going      OT SHORT TERM GOAL #3   Title Pt will perform tabletop visual scanning with 90% accuracy and no reports of diplopia.    Time 4    Period Weeks    Status On-going      OT SHORT TERM GOAL #4   Title Pt will verbalize understanding of visual compensation strategies for ADLs/IADLs prn (including ?taping, head turns/positioning)    Time 4    Period Weeks    Status On-going      OT SHORT TERM GOAL #5   Title Pt will improve coordination for ADLs as shown by improving time on 9-hole peg test by at least 5sec bilaterally.    Baseline R-66.85sec, L-41.25sec    Time 4    Period Weeks    Status On-going               OT Long Term Goals - 10/02/21 1539       OT LONG TERM GOAL #1   Title Pt will be independent with updated HEP.    Time 12    Period Weeks    Status New      OT LONG TERM GOAL #2   Title Pt will perform simple snack prep/home maintenance task with supervision.    Time 12    Period Weeks    Status New      OT LONG TERM GOAL #3   Title Pt will perform environmental scanning/navigation with at least 90% accuracy for incr safety in the home/community.    Time 12    Period Weeks    Status New      OT LONG TERM GOAL #4   Title Pt will improve RUE coordination as shown by improving time on 9-hole peg test by at least 15sec.    Baseline 66.85sec    Time 12    Period Weeks    Status New      OT LONG TERM GOAL #5   Title Pt will be able to retrieve/replace 1-2 objects on overhead shelf with each UE x10 safetly and with good control for IADL tasks.    Time 12    Period Weeks    Status New                   Plan - 10/25/21 1024     Clinical Impression Statement Pt is progressing towards goals with improving UE functional use. He demonstrates understanding of previously issued HEP.    OT Occupational Profile and History Detailed Assessment- Review of Records  and additional review of  physical, cognitive, psychosocial history related to current functional performance    Occupational performance deficits (Please refer to evaluation for details): ADL's;IADL's;Work;Leisure    Body Structure / Function / Physical Skills ADL;UE functional use;Balance;FMC;Coordination;Sensation;IADL;Decreased knowledge of use of DME;Dexterity;Vision    Rehab Potential Good    Clinical Decision Making Several treatment options, min-mod task modification necessary    Comorbidities Affecting Occupational Performance: May have comorbidities impacting occupational performance    Modification or Assistance to Complete Evaluation  Min-Moderate modification of tasks or assist with assess necessary to complete eval    OT Frequency 2x / week    OT Duration 12 weeks   +eval   OT Treatment/Interventions Self-care/ADL training;Energy conservation;Ultrasound;Visual/perceptual remediation/compensation;Patient/family education;DME and/or AE instruction;Aquatic Therapy;Paraffin;Passive range of motion;Balance training;Cryotherapy;Functional Mobility Training;Moist Heat;Therapeutic exercise;Manual Therapy;Therapeutic activities;Neuromuscular education    Plan environmental scanning with RW and ?visual compensation strategies prn, neuro re-ed , coordination    OT Home Exercise Plan Pt reports that he has some coordination activities and visual HEP from hospital    Consulted and Agree with Plan of Care Patient;Family member/caregiver    Family Member Consulted wife             Patient will benefit from skilled therapeutic intervention in order to improve the following deficits and impairments:   Body Structure / Function / Physical Skills: ADL, UE functional use, Balance, FMC, Coordination, Sensation, IADL, Decreased knowledge of use of DME, Dexterity, Vision       Visit Diagnosis: Other lack of coordination  Unsteadiness on feet  Other symptoms and signs involving the nervous system  Visuospatial  deficit  Other disturbances of skin sensation  Ataxia    Problem List Patient Active Problem List   Diagnosis Date Noted   Urinary retention 09/27/2021   Diplopia 09/27/2021   Carotid stenosis, right 09/27/2021   Anemia 09/27/2021   Dyslipidemia    Slow transit constipation    Benign essential HTN    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 [place], [place], [address] Phone: [phone]   Fax:  [phone]  Name: Leilan Bochenek MRN: [id number] Date of Birth: 10/09/1956

## 2021-10-30 ENCOUNTER — Ambulatory Visit: Payer: 59 | Attending: Physician Assistant | Admitting: Physical Therapy

## 2021-10-30 ENCOUNTER — Other Ambulatory Visit: Payer: Self-pay

## 2021-10-30 ENCOUNTER — Ambulatory Visit: Payer: 59 | Admitting: Occupational Therapy

## 2021-10-30 ENCOUNTER — Encounter: Payer: Self-pay | Admitting: Occupational Therapy

## 2021-10-30 DIAGNOSIS — R208 Other disturbances of skin sensation: Secondary | ICD-10-CM

## 2021-10-30 DIAGNOSIS — R270 Ataxia, unspecified: Secondary | ICD-10-CM | POA: Insufficient documentation

## 2021-10-30 DIAGNOSIS — R29818 Other symptoms and signs involving the nervous system: Secondary | ICD-10-CM | POA: Insufficient documentation

## 2021-10-30 DIAGNOSIS — R41842 Visuospatial deficit: Secondary | ICD-10-CM | POA: Insufficient documentation

## 2021-10-30 DIAGNOSIS — R2681 Unsteadiness on feet: Secondary | ICD-10-CM | POA: Insufficient documentation

## 2021-10-30 DIAGNOSIS — R2689 Other abnormalities of gait and mobility: Secondary | ICD-10-CM | POA: Insufficient documentation

## 2021-10-30 DIAGNOSIS — R278 Other lack of coordination: Secondary | ICD-10-CM | POA: Diagnosis present

## 2021-10-30 NOTE — Therapy (Signed)
[facility] Rehabilitation [facility] [address] [place], [place], [address] Phone: [phone]   Fax:  [phone]  Occupational Therapy Treatment  Patient Details  Name: Maurice Little MRN: [id number] Date of Birth: October 15, 1956 Referring Provider (OT): Mariam Dollar, PA-C   Encounter Date: 10/30/2021   OT End of Session - 10/30/21 1229     Visit Number 7    Number of Visits 25    Date for OT Re-Evaluation 12/31/21    Authorization Type UHC--no visit limit/no auth, copay per day    OT Start Time 1238   pt was in the restroom   OT Stop Time 1316    OT Time Calculation (min) 38 min    Activity Tolerance Patient tolerated treatment well    Behavior During Therapy [facility] for tasks assessed/performed             Past Medical History:  Diagnosis Date   Benign prostatic hyperplasia with elevated prostate specific antigen (PSA)    Hypertension    Iliac artery stenosis, left (HCC)    with claudication resolved with stent   PAD (peripheral artery disease) (HCC)     Past Surgical History:  Procedure Laterality Date   BUBBLE STUDY  08/31/2021   Procedure: BUBBLE STUDY;  Surgeon: Chilton Si, MD;  Location: [facility] ENDOSCOPY;  Service: Cardiovascular;;   ILIAC ARTERY STENT Left    IR ANGIO VERTEBRAL SEL SUBCLAVIAN INNOMINATE UNI L MOD SED  08/30/2021   IR ANGIO VERTEBRAL SEL SUBCLAVIAN INNOMINATE UNI R MOD SED  08/30/2021   IR CT HEAD LTD  08/30/2021   IR INTRA CRAN STENT  08/30/2021   IR US GUIDE VASC ACCESS RIGHT  08/30/2021   RADIOLOGY WITH ANESTHESIA N/A 08/30/2021   Procedure: IR WITH ANESTHESIA;  Surgeon: Julieanne Cotton, MD;  Location: [facility] OR;  Service: Radiology;  Laterality: N/A;   TEE WITHOUT CARDIOVERSION N/A 08/31/2021   Procedure: TRANSESOPHAGEAL ECHOCARDIOGRAM (TEE);  Surgeon: Chilton Si, MD;  Location: [facility] ENDOSCOPY;  Service: Cardiovascular;  Laterality: N/A;    There were no vitals filed for this visit.   Subjective  Assessment - 10/30/21 1228     Subjective  "feeling better.  Got the foley out last Thurs."    Pertinent History CVA (R cereballar and R small R brachium potis and R pontomedullary junction).   PMH:  dyslipidemia, HTN, carotid stenosis, anemia, PAD    Limitations ataxia, diplopia, folley catheter, decr hearing (particularly R side)    Patient Stated Goals incr independence, get better    Currently in Pain? Yes    Pain Score 3     Pain Location Wrist    Pain Orientation Right    Pain Descriptors / Indicators Aching    Pain Type Acute pain    Pain Onset 1 to 4 weeks ago    Pain Frequency Intermittent    Aggravating Factors  wt. bearing through RW    Pain Relieving Factors rest                 Closed-chain shoulder flex, chest press with scapular retraction, and diagonals to each side with min cueing for wt. Shift with diagonals and positioning.  Sitting, functional reaching with RUE to place smalls pegs in vertical pegboard with min cueing for shoulder hike, midline alignment, min difficulty with ataxia/coordination, copied with design placed to the R with no reports of diplopia and good accuracy.  Removing using in-hand manipulation to remove 3 and then place in container 1 at  a time with min difficulty/incr time.  Prone, scapular exercises with shoulders in ext and abduction with min v.c. followed by wt. Bearing on elbows with chest/head lift for scapular depression/stability.  Pt/wife interested in increasing ambulation for endurance.  Recommended walking in restaurant/drug store vs. [employer] as restaurant will be shorter distance to start with.  Pt ambulated and squat pivot transfer with close supervision.        OT Short Term Goals - 10/30/21 1311       OT SHORT TERM GOAL #1   Title Pt will be independent with initial HEP.--check STGs 11/02/21    Time 4    Period Weeks    Status Achieved      OT SHORT TERM GOAL #2   Title Pt will perform BADLs mod I except  supervision for shower transfer.    Time 4    Period Weeks    Status On-going      OT SHORT TERM GOAL #3   Title Pt will perform tabletop visual scanning with 90% accuracy and no reports of diplopia.    Time 4    Period Weeks    Status Achieved      OT SHORT TERM GOAL #4   Title Pt will verbalize understanding of visual compensation strategies for ADLs/IADLs prn (including ?taping, head turns/positioning)    Time 4    Period Weeks    Status On-going      OT SHORT TERM GOAL #5   Title Pt will improve coordination for ADLs as shown by improving time on 9-hole peg test by at least 5sec bilaterally.    Baseline R-66.85sec, L-41.25sec    Time 4    Period Weeks    Status On-going               OT Long Term Goals - 10/02/21 1539       OT LONG TERM GOAL #1   Title Pt will be independent with updated HEP.    Time 12    Period Weeks    Status New      OT LONG TERM GOAL #2   Title Pt will perform simple snack prep/home maintenance task with supervision.    Time 12    Period Weeks    Status New      OT LONG TERM GOAL #3   Title Pt will perform environmental scanning/navigation with at least 90% accuracy for incr safety in the home/community.    Time 12    Period Weeks    Status New      OT LONG TERM GOAL #4   Title Pt will improve RUE coordination as shown by improving time on 9-hole peg test by at least 15sec.    Baseline 66.85sec    Time 12    Period Weeks    Status New      OT LONG TERM GOAL #5   Title Pt will be able to retrieve/replace 1-2 objects on overhead shelf with each UE x10 safetly and with good control for IADL tasks.    Time 12    Period Weeks    Status New                   Plan - 10/30/21 1229     Clinical Impression Statement Pt is progressing towards goals with improving UE control and incr core stability.  Pt reports feeling better after foley removed last week.    OT Occupational Profile and History Detailed Assessment-  Review of  Records and additional review of physical, cognitive, psychosocial history related to current functional performance    Occupational performance deficits (Please refer to evaluation for details): ADL's;IADL's;Work;Leisure    Body Structure / Function / Physical Skills ADL;UE functional use;Balance;FMC;Coordination;Sensation;IADL;Decreased knowledge of use of DME;Dexterity;Vision    Rehab Potential Good    Clinical Decision Making Several treatment options, min-mod task modification necessary    Comorbidities Affecting Occupational Performance: May have comorbidities impacting occupational performance    Modification or Assistance to Complete Evaluation  Min-Moderate modification of tasks or assist with assess necessary to complete eval    OT Frequency 2x / week    OT Duration 12 weeks   +eval   OT Treatment/Interventions Self-care/ADL training;Energy conservation;Ultrasound;Visual/perceptual remediation/compensation;Patient/family education;DME and/or AE instruction;Aquatic Therapy;Paraffin;Passive range of motion;Balance training;Cryotherapy;Functional Mobility Training;Moist Heat;Therapeutic exercise;Manual Therapy;Therapeutic activities;Neuromuscular education    Plan begin checking STGs; environmental scanning with RW and ?visual compensation strategies prn, neuro re-ed , coordination    OT Home Exercise Plan Pt reports that he has some coordination activities and visual HEP from hospital    Consulted and Agree with Plan of Care Patient;Family member/caregiver    Family Member Consulted wife             Patient will benefit from skilled therapeutic intervention in order to improve the following deficits and impairments:   Body Structure / Function / Physical Skills: ADL, UE functional use, Balance, FMC, Coordination, Sensation, IADL, Decreased knowledge of use of DME, Dexterity, Vision       Visit Diagnosis: Other lack of coordination  Other symptoms and signs involving the nervous  system  Visuospatial deficit  Other disturbances of skin sensation  Ataxia    Problem List Patient Active Problem List   Diagnosis Date Noted   Urinary retention 09/27/2021   Diplopia 09/27/2021   Carotid stenosis, right 09/27/2021   Anemia 09/27/2021   Dyslipidemia    Slow transit constipation    Benign essential HTN    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   Acute ischemic stroke [facility](HCC)    Cerebral embolism with cerebral infarction 08/30/2021   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 [facility](HCC) 08/30/2021   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 [place], [place]  [address] [phone] phone [phone] 10/30/21 2:52 PM

## 2021-10-30 NOTE — Therapy (Signed)
[facility] Health Outpt Rehabilitation [facility] [address] [place], [place], [address] Phone: [phone]   Fax:  [phone]  Physical Therapy Treatment  Patient Details  Name: Maurice Little MRN: [id number] Date of Birth: 09/21/1956 Referring Provider (PT): Mariam Dollar, PA-C   Encounter Date: 10/30/2021   PT End of Session - 10/30/21 1618     Visit Number 3    Number of Visits 25    Date for PT Re-Evaluation 01/11/22    Authorization Type UHC ([place] with no auth required)    PT Start Time 1148    PT Stop Time 1233    PT Time Calculation (min) 45 min    Equipment Utilized During Treatment Gait belt    Activity Tolerance Patient tolerated treatment well   limited by pain from bladder spasms   Behavior During Therapy [facility] for tasks assessed/performed             Past Medical History:  Diagnosis Date   Benign prostatic hyperplasia with elevated prostate specific antigen (PSA)    Hypertension    Iliac artery stenosis, left (HCC)    with claudication resolved with stent   PAD (peripheral artery disease) (HCC)     Past Surgical History:  Procedure Laterality Date   BUBBLE STUDY  08/31/2021   Procedure: BUBBLE STUDY;  Surgeon: Chilton Si, MD;  Location: [facility] ENDOSCOPY;  Service: Cardiovascular;;   ILIAC ARTERY STENT Left    IR ANGIO VERTEBRAL SEL SUBCLAVIAN INNOMINATE UNI L MOD SED  08/30/2021   IR ANGIO VERTEBRAL SEL SUBCLAVIAN INNOMINATE UNI R MOD SED  08/30/2021   IR CT HEAD LTD  08/30/2021   IR INTRA CRAN STENT  08/30/2021   IR US GUIDE VASC ACCESS RIGHT  08/30/2021   RADIOLOGY WITH ANESTHESIA N/A 08/30/2021   Procedure: IR WITH ANESTHESIA;  Surgeon: Julieanne Cotton, MD;  Location: [facility] OR;  Service: Radiology;  Laterality: N/A;   TEE WITHOUT CARDIOVERSION N/A 08/31/2021   Procedure: TRANSESOPHAGEAL ECHOCARDIOGRAM (TEE);  Surgeon: Chilton Si, MD;  Location: [facility] ENDOSCOPY;  Service: Cardiovascular;  Laterality: N/A;    There were  no vitals filed for this visit.   Subjective Assessment - 10/30/21 1155     Subjective Pt reports he got his Foley catheter out last week and has been doing so much better; wife states his walking is much better - is walking more because he is going to bathroom more often now that he doesn't have the Foley catheter    Patient is accompained by: Family member   [name] (wife)   Pertinent History Basilar artery stenosis, urinary retention with indwelling catheter, neurogenic bladder, diplopia, Rt cerebellar infarct, HTN, Rt carotid stenosis, anemia, s/p cerebral angiogram with stent    Limitations Lifting;Standing;Walking;House hold activities;Reading    Patient Stated Goals "I want to be normal again - walk without walker", decrease numbness on Rt side of body    Currently in Pain? No/denies                               [facility] Adult PT Treatment/Exercise - 10/30/21 1216       Transfers   Transfers Sit to Stand;Stand to Sit    Sit to Stand 4: Min guard    Stand to Sit 4: Min guard;5: Supervision    Comments Performed 3 reps sit to stand with bil. UE support; 5 reps with 1 UE support;  5 reps without any UE support  Ambulation/Gait   Ambulation/Gait Yes    Ambulation/Gait Assistance 4: Min guard    Ambulation Distance (Feet) 115 Feet   2 reps around track   Assistive device Rolling walker    Gait Pattern Step-through pattern;Ataxic;Decreased weight shift to right;Decreased weight shift to left;Wide base of support    Ambulation Surface Level;Indoor      Neuro Re-ed    Neuro Re-ed Details  Quadruped position - lifting opposite UE/LE 3 reps each side 5 sec hold with mod assist for balance recovery ; tall kneeling - moving each leg forward/back 5 reps each leg with bil. UE support on PT's shoulders; small mini squats in tall kneeling 10 reps with bil. UE support            NeuroRe-ed:   Pt performed standing balance/coordination exercise - holding onto RW prn -  pt touched 2 balance bubbles with each foot - 3 reps with bil. UE support, 3 reps with 1 UE support, and then 3 reps touching each bubble with each foot without UE support  with min assist for stability and balance          PT Short Term Goals - 10/30/21 2002       PT SHORT TERM GOAL #1   Title Pt will perform basic transfers wheelchair to/from mat using squat pivot transfer with supervision.    Baseline CGA    Time 4    Period Weeks    Status New    [place] Date 11/16/21      PT SHORT TERM GOAL #2   Title Pt will improve Berg score from 27/56 to >/= 32/56 to demo improved balance with reduced fall risk.    Baseline 27/56    Time 4    Period Weeks    Status New    Target Date 11/16/21      PT SHORT TERM GOAL #3   Title Improve TUG score from 1".07 secs to </= 50 secs with RW with CGA.    Baseline 1" 7 secs with RW    Time 4    Period Weeks    Status New    Target Date 11/16/21      PT SHORT TERM GOAL #4   Title Perform sit to stand from mat to RW with SBA with normal BOS.    Time 4    Period Weeks    Status New    Target Date 11/16/21      PT SHORT TERM GOAL #5   Title Amb. 29115' with RW with CGA on flat, even surface.    Time 4    Period Weeks    Status New    Target Date 11/16/21      PT SHORT TERM GOAL #6   Title Perform HEP for standing balance with wife's assistance.    Time 4    Period Weeks    Status New    Target Date 11/16/21               PT Long Term Goals - 10/30/21 2002       PT LONG TERM GOAL #1   Title Pt will be modified independent with household amb. without use of assistive device.    Time 12    Period Weeks    Status New    Target Date 01/11/22      PT LONG TERM GOAL #2   Title Pt will amb. 1000' with SPC with SBA on flat even and  uneven surfaces for increased community accessibility.    Time 12    Period Weeks    Status New    Target Date 01/11/22      PT LONG TERM GOAL #3   Title Improve TUG score to </= 25 secs  with appropriate assistive device to demo improved functional mobility and to reduce fall risk.    Baseline 1".07 secs with RW - 10-16-21    Time 12    Period Weeks    Status New    Target Date 01/11/22      PT LONG TERM GOAL #4   Title Increase Berg score to >/= 45/56 to reduce fall risk.    Baseline 27/56    Time 12    Period Weeks    Status New    Target Date 01/11/22      PT LONG TERM GOAL #5   Title Increase gait velocity from .43 ft/sec to >/= 1.6 ft/sec with RW for increased gait efficiency.    Time 12    Period Weeks    Status New    Target Date 01/11/22      PT LONG TERM GOAL #6   Title Independent in updated HEP as appropriate.    Time 12    Period Weeks    Status New    Target Date 01/11/22                   Plan - 10/30/21 1619     Clinical Impression Statement PT session focused on gait training with use of RW and core stabilization exercises in tall kneeling and quadruped position.  Pt needed mod assist for stabilization in performing "bird dog" exercise in quadruped position.  Pt had more difficulty balancing on LLE with RUE lifted in quadruped position.  Pt's gait more fluid with less foot flat noted on LLE with verbal cues for increased Lt heel contact in stance.  Pt is progressing well towards goals.    Personal Factors and Comorbidities Comorbidity 2;Profession    Comorbidities basilar artery stenosis, dyslipidemia,  HTN, neurogenic bladder, diplopia, anemia, Lt iliac artery stenosis, PAD    Examination-Activity Limitations Bathing;Carry;Continence;Toileting;Dressing;Lift;Stand;Stairs;Squat;Reach Overhead;Locomotion Level;Transfers    Examination-Participation Restrictions Cleaning;Community Activity;Driving;Interpersonal Relationship;Laundry;Shop;Occupation;Yard Work;Meal Prep    Stability/Clinical Decision Making Evolving/Moderate complexity    Rehab Potential Good    PT Frequency 2x / week    PT Duration 12 weeks    PT Treatment/Interventions  ADLs/Self Care Home Management;Aquatic Therapy;DME Instruction;Gait training;Stair training;Therapeutic activities;Therapeutic exercise;Balance training;Neuromuscular re-education;Patient/family education;Vestibular    PT Next Visit Plan add to HEP for balance - marching, stepping strategy if pt able to perform safely with wife's assistance; gait train with RW. Standing weight shifting, balance with decr UE support. Weight bearing activities for ataxia.    PT Home Exercise Plan [id number]    Consulted and Agree with Plan of Care Patient;Family member/caregiver    Family Member Consulted wife, [name]             Patient will benefit from skilled therapeutic intervention in order to improve the following deficits and impairments:  Abnormal gait, Decreased activity tolerance, Decreased balance, Decreased coordination, Dizziness, Impaired sensation, Impaired UE functional use, Impaired vision/preception  Visit Diagnosis: Other abnormalities of gait and mobility  Unsteadiness on feet  Other lack of coordination     Problem List Patient Active Problem List   Diagnosis Date Noted   Urinary retention 09/27/2021   Diplopia 09/27/2021   Carotid stenosis, right 09/27/2021  Anemia 09/27/2021   Dyslipidemia    Slow transit constipation    Benign essential HTN    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   Acute ischemic stroke [facility](HCC)    Cerebral embolism with cerebral infarction 08/30/2021   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

## 2021-11-01 ENCOUNTER — Other Ambulatory Visit: Payer: Self-pay

## 2021-11-01 ENCOUNTER — Encounter: Payer: Self-pay | Admitting: Physical Therapy

## 2021-11-01 ENCOUNTER — Ambulatory Visit: Payer: 59 | Admitting: Occupational Therapy

## 2021-11-01 ENCOUNTER — Ambulatory Visit: Payer: 59 | Admitting: Physical Therapy

## 2021-11-01 DIAGNOSIS — R270 Ataxia, unspecified: Secondary | ICD-10-CM

## 2021-11-01 DIAGNOSIS — R278 Other lack of coordination: Secondary | ICD-10-CM

## 2021-11-01 DIAGNOSIS — R2689 Other abnormalities of gait and mobility: Secondary | ICD-10-CM

## 2021-11-01 DIAGNOSIS — R29818 Other symptoms and signs involving the nervous system: Secondary | ICD-10-CM

## 2021-11-01 DIAGNOSIS — R2681 Unsteadiness on feet: Secondary | ICD-10-CM

## 2021-11-01 DIAGNOSIS — R208 Other disturbances of skin sensation: Secondary | ICD-10-CM

## 2021-11-01 DIAGNOSIS — R41842 Visuospatial deficit: Secondary | ICD-10-CM

## 2021-11-01 NOTE — Therapy (Signed)
[facility] [address] [place], [place], [address] Phone: [phone]   Fax:  [phone]  Occupational Therapy Treatment  Patient Details  Name: Maurice Little MRN: [id number] Date of Birth: [birth year] Referring Provider (OT): Lauraine Rinne, PA-C   Encounter Date: 11/01/2021   OT End of Session - 11/01/21 1347     Visit Number 8    Number of Visits 25    Date for OT Re-Evaluation 12/31/21    Authorization Type UHC--no visit limit/no auth, copay per day    OT Start Time 1239    OT Stop Time 1315    OT Time Calculation (min) 36 min    Activity Tolerance Patient tolerated treatment well    Behavior During Therapy [facility] for tasks assessed/performed             Past Medical History:  Diagnosis Date   Benign prostatic hyperplasia with elevated prostate specific antigen (PSA)    Hypertension    Iliac artery stenosis, left ([place])    with claudication resolved with stent   PAD (peripheral artery disease) ([place])     Past Surgical History:  Procedure Laterality Date   BUBBLE STUDY  08/31/2021   Procedure: BUBBLE STUDY;  Surgeon: Skeet Latch, MD;  Location: [facility];  Service: Cardiovascular;;   ILIAC ARTERY STENT Left    IR ANGIO VERTEBRAL SEL SUBCLAVIAN INNOMINATE UNI L MOD SED  08/30/2021   IR ANGIO VERTEBRAL SEL SUBCLAVIAN INNOMINATE UNI R MOD SED  08/30/2021   IR CT HEAD LTD  08/30/2021   IR INTRA CRAN STENT  08/30/2021   IR US GUIDE VASC ACCESS RIGHT  08/30/2021   RADIOLOGY WITH ANESTHESIA N/A 08/30/2021   Procedure: IR WITH ANESTHESIA;  Surgeon: Luanne Bras, MD;  Location: [place];  Service: Radiology;  Laterality: N/A;   TEE WITHOUT CARDIOVERSION N/A 08/31/2021   Procedure: TRANSESOPHAGEAL ECHOCARDIOGRAM (TEE);  Surgeon: Skeet Latch, MD;  Location: [place];  Service: Cardiovascular;  Laterality: N/A;    There were no vitals filed for this visit.   Subjective Assessment - 11/01/21  1243     Pertinent History CVA (R cereballar and R small R brachium potis and R pontomedullary junction).   PMH:  dyslipidemia, HTN, carotid stenosis, anemia, PAD    Limitations ataxia, diplopia, folley catheter, decr hearing (particularly R side)    Patient Stated Goals incr independence, get better    Currently in Pain? No/denies                    Treatment: Therapist continued checking short term goals. Pt demonstrates progress towards goals.  Ambulating with walker environmental scanning task with 100% accuracy, therapist padded hand grip with washcloth and coban for comfort.  Functional reaching to place washers on targets with right and left UE's with improved performance to address functional reach and coordination.                OT Education - 11/01/21 1515     Education Details Education regarding visual compensation strategies: head turns, organized scan pattern, use of patch at night to protect eye    Person(s) Educated Patient;Spouse    Methods Explanation    Comprehension Verbalized understanding              OT Short Term Goals - 11/01/21 1243       OT SHORT TERM GOAL #1   Title Pt will be independent with initial HEP.--check STGs 11/02/21    Time  4    Period Weeks    Status Achieved      OT SHORT TERM GOAL #2   Title Pt will perform BADLs mod I except supervision for shower transfer.    Time 4    Period Weeks    Status On-going   min A for back, supervision for standing to pull up pants     OT SHORT TERM GOAL #3   Title Pt will perform tabletop visual scanning with 90% accuracy and no reports of diplopia.    Time 4    Period Weeks    Status Achieved      OT SHORT TERM GOAL #4   Title Pt will verbalize understanding of visual compensation strategies for ADLs/IADLs prn (including ?taping, head turns/positioning)    Time 4    Period Weeks    Status Achieved   Pt/ wife verbalize understanding     OT SHORT TERM GOAL #5   Title  Pt will improve coordination for ADLs as shown by improving time on 9-hole peg test by at least 5sec bilaterally.    Baseline R-66.85sec, L-41.25sec    Time 4    Period Weeks    Status On-going   R 65.25-not met yet improvedd, L 33.60- met partially met, met for LUE              OT Long Term Goals - 10/02/21 1539       OT LONG TERM GOAL #1   Title Pt will be independent with updated HEP.    Time 12    Period Weeks    Status New      OT LONG TERM GOAL #2   Title Pt will perform simple snack prep/home maintenance task with supervision.    Time 12    Period Weeks    Status New      OT LONG TERM GOAL #3   Title Pt will perform environmental scanning/navigation with at least 90% accuracy for incr safety in the home/community.    Time 12    Period Weeks    Status New      OT LONG TERM GOAL #4   Title Pt will improve RUE coordination as shown by improving time on 9-hole peg test by at least 15sec.    Baseline 66.85sec    Time 12    Period Weeks    Status New      OT LONG TERM GOAL #5   Title Pt will be able to retrieve/replace 1-2 objects on overhead shelf with each UE x10 safetly and with good control for IADL tasks.    Time 12    Period Weeks    Status New                   Plan - 11/01/21 1313     Clinical Impression Statement Pt is progressing towards goals with improving UE control , fine motor coordination and fucntional use.    OT Occupational Profile and History Detailed Assessment- Review of Records and additional review of physical, cognitive, psychosocial history related to current functional performance    Occupational performance deficits (Please refer to evaluation for details): ADL's;IADL's;Work;Leisure    Body Structure / Function / Physical Skills ADL;UE functional use;Balance;FMC;Coordination;Sensation;IADL;Decreased knowledge of use of DME;Dexterity;Vision    Rehab Potential Good    Clinical Decision Making Several treatment options,  min-mod task modification necessary    Comorbidities Affecting Occupational Performance: May have comorbidities impacting occupational performance  Modification or Assistance to Complete Evaluation  Min-Moderate modification of tasks or assist with assess necessary to complete eval    OT Frequency 2x / week    OT Duration 12 weeks   +eval   OT Treatment/Interventions Self-care/ADL training;Energy conservation;Ultrasound;Visual/perceptual remediation/compensation;Patient/family education;DME and/or AE instruction;Aquatic Therapy;Paraffin;Passive range of motion;Balance training;Cryotherapy;Functional Mobility Training;Moist Heat;Therapeutic exercise;Manual Therapy;Therapeutic activities;Neuromuscular education    Plan neuro re-ed , coordination, prone or quadraped    OT Home Exercise Plan Pt reports that he has some coordination activities and visual HEP from hospital    Consulted and Agree with Plan of Care Patient;Family member/caregiver    Family Member Consulted wife             Patient will benefit from skilled therapeutic intervention in order to improve the following deficits and impairments:   Body Structure / Function / Physical Skills: ADL, UE functional use, Balance, FMC, Coordination, Sensation, IADL, Decreased knowledge of use of DME, Dexterity, Vision       Visit Diagnosis: Other lack of coordination  Other symptoms and signs involving the nervous system  Visuospatial deficit  Other disturbances of skin sensation  Ataxia  Other abnormalities of gait and mobility  Unsteadiness on feet    Problem List Patient Active Problem List   Diagnosis Date Noted   Urinary retention 09/27/2021   Diplopia 09/27/2021   Carotid stenosis, right 09/27/2021   Anemia 09/27/2021   Dyslipidemia    Slow transit constipation    Benign essential HTN    Cerebellar stroke ([place]) 09/04/2021   Acute ischemic stroke [facility])    Cerebral embolism with cerebral infarction 08/30/2021    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

## 2021-11-02 NOTE — Therapy (Signed)
[facility] [address] [place], [place], [address] Phone: [phone]   Fax:  [phone]  Physical Therapy Treatment  Patient Details  Name: Maurice Little MRN: [id number] Date of Birth: [birth year] Referring Provider (PT): Mariam Dollar, PA-C   Encounter Date: 11/01/2021   PT End of Session - 11/02/21 1545     Visit Number 4    Number of Visits 25    Date for PT Re-Evaluation 01/11/22    Authorization Type UHC ([place] with no auth required)    PT Start Time 1146    PT Stop Time 1231    PT Time Calculation (min) 45 min    Equipment Utilized During Treatment Gait belt    Activity Tolerance Patient tolerated treatment well   limited by pain from bladder spasms   Behavior During Therapy [facility] for tasks assessed/performed             Past Medical History:  Diagnosis Date   Benign prostatic hyperplasia with elevated prostate specific antigen (PSA)    Hypertension    Iliac artery stenosis, left ([facility])    with claudication resolved with stent   PAD (peripheral artery disease) ([facility])     Past Surgical History:  Procedure Laterality Date   BUBBLE STUDY  08/31/2021   Procedure: BUBBLE STUDY;  Surgeon: Chilton Si, MD;  Location: [facility] ENDOSCOPY;  Service: Cardiovascular;;   ILIAC ARTERY STENT Left    IR ANGIO VERTEBRAL SEL SUBCLAVIAN INNOMINATE UNI L MOD SED  08/30/2021   IR ANGIO VERTEBRAL SEL SUBCLAVIAN INNOMINATE UNI R MOD SED  08/30/2021   IR CT HEAD LTD  08/30/2021   IR INTRA CRAN STENT  08/30/2021   IR US GUIDE VASC ACCESS RIGHT  08/30/2021   RADIOLOGY WITH ANESTHESIA N/A 08/30/2021   Procedure: IR WITH ANESTHESIA;  Surgeon: Julieanne Cotton, MD;  [facility];  Service: Radiology;  Laterality: N/A;   TEE WITHOUT CARDIOVERSION N/A 08/31/2021   Procedure: TRANSESOPHAGEAL ECHOCARDIOGRAM (TEE);  Surgeon: Chilton Si, MD;  Location: [facility] ENDOSCOPY;  Service: Cardiovascular;  Laterality: N/A;    There were  no vitals filed for this visit.   Subjective Assessment - 11/01/21 1147     Subjective Pt reports he continues to do well - able to move around easier without Foley catheter    Patient is accompained by: Family member   [name] (wife)   Pertinent History Basilar artery stenosis, urinary retention with indwelling catheter, neurogenic bladder, diplopia, Rt cerebellar infarct, HTN, Rt carotid stenosis, anemia, s/p cerebral angiogram with stent    Limitations Lifting;Standing;Walking;House hold activities;Reading    Patient Stated Goals "I want to be normal again - walk without walker", decrease numbness on Rt side of body    Currently in Pain? Yes    Pain Score 3     Pain Location Wrist    Pain Orientation Right    Pain Descriptors / Indicators Aching    Pain Type Acute pain    Pain Onset 1 to 4 weeks ago    Pain Frequency Intermittent                               [facility] Adult PT Treatment/Exercise - 11/02/21 0001       Transfers   Transfers Sit to Stand;Stand to Sit    Sit to Stand 4: Min guard    Stand to Sit 4: Min guard    Number of Reps  10 reps    Comments no UE support used from mat - occasional mod to min assist given for initial stabilization upon standing      Ambulation/Gait   Ambulation/Gait Yes    Ambulation/Gait Assistance 4: Min guard    Ambulation Distance (Feet) 230 Feet   40' inside // bars without UE support - UE support used with turning around inside // bars - with CGA to min assist   Assistive device Rolling walker    Gait Pattern Step-through pattern;Ataxic;Decreased weight shift to right;Decreased weight shift to left;Wide base of support    Ambulation Surface Level;Indoor    Stairs Yes    Stairs Assistance 4: Min guard    Stair Management Technique Two rails;Alternating pattern;Forwards    Number of Stairs 4    Height of Stairs 6             Quadruped position - lifting opposite UE/LE 3 times each side with 3 sec hold with mod  assist  Tall kneeling - min to mod assist - lifting each UE to 90 degrees shoulder flexion; lifting bil. UE's together Trunk rotation with arms held at 90 degrees flexion, fingers clasped together 3 reps to each side with min to mod assist For balance  Pt sat on Sit fit on edge of mat - performed knee extension each leg 3 times; UE flexion 3 reps; attempted contralateral UE flexion with knee extension 3 reps each with mod assist for balance Seated marching with min to mod assist 5 reps each  Pt performed sitting balance weight shifting - leaning forward to limit of stability, then leaning backward with min assist - 5 reps - seated on SitFit for increased challenge with core stabilization  Stepping forward/back with minimal UE support on RW - 5 reps each LE with min assist          PT Short Term Goals - 11/02/21 1554       PT SHORT TERM GOAL #1   Title Pt will perform basic transfers wheelchair to/from mat using squat pivot transfer with supervision.    Baseline CGA    Time 4    Period Weeks    Status New    Target Date 11/16/21      PT SHORT TERM GOAL #2   Title Pt will improve Berg score from 27/56 to >/= 32/56 to demo improved balance with reduced fall risk.    Baseline 27/56    Time 4    Period Weeks    Status New    Target Date 11/16/21      PT SHORT TERM GOAL #3   Title Improve TUG score from 1".07 secs to </= 50 secs with RW with CGA.    Baseline 1" 7 secs with RW    Time 4    Period Weeks    Status New    Target Date 11/16/21      PT SHORT TERM GOAL #4   Title Perform sit to stand from mat to RW with SBA with normal BOS.    Time 4    Period Weeks    Status New    Target Date 11/16/21      PT SHORT TERM GOAL #5   Title Amb. 57115' with RW with CGA on flat, even surface.    Time 4    Period Weeks    Status New    Target Date 11/16/21      PT SHORT TERM GOAL #6  Title Perform HEP for standing balance with wife's assistance.    Time 4    Period Weeks     Status New    Target Date 11/16/21               PT Long Term Goals - 11/02/21 1554       PT LONG TERM GOAL #1   Title Pt will be modified independent with household amb. without use of assistive device.    Time 12    Period Weeks    Status New    Target Date 01/11/22      PT LONG TERM GOAL #2   Title Pt will amb. 1000' with SPC with SBA on flat even and uneven surfaces for increased community accessibility.    Time 12    Period Weeks    Status New    Target Date 01/11/22      PT LONG TERM GOAL #3   Title Improve TUG score to </= 25 secs with appropriate assistive device to demo improved functional mobility and to reduce fall risk.    Baseline 1".07 secs with RW - 10-16-21    Time 12    Period Weeks    Status New    Target Date 01/11/22      PT LONG TERM GOAL #4   Title Increase Berg score to >/= 45/56 to reduce fall risk.    Baseline 27/56    Time 12    Period Weeks    Status New    Target Date 01/11/22      PT LONG TERM GOAL #5   Title Increase gait velocity from .43 ft/sec to >/= 1.6 ft/sec with RW for increased gait efficiency.    Time 12    Period Weeks    Status New    Target Date 01/11/22      PT LONG TERM GOAL #6   Title Independent in updated HEP as appropriate.    Time 12    Period Weeks    Status New    Target Date 01/11/22                   Plan - 11/02/21 1545     Clinical Impression Statement Pt improving with gait and activity tolerance as he was able to amb. 230' (2 laps consecutively) prior to needing seated rest period.  Pt had LOB with sit to stand approx. 4 reps out 10 with min to mod assist needed for recovery of LOB.  Pt continues to need mod assist for stabilization in quadruped position due to decrease trunk stabilization.  Gait pattern was improved with increased initial heel contact of LLE noted in stance phase of gait.  Cont with POC.    Personal Factors and Comorbidities Comorbidity 2;Profession    Comorbidities  basilar artery stenosis, dyslipidemia,  HTN, neurogenic bladder, diplopia, anemia, Lt iliac artery stenosis, PAD    Examination-Activity Limitations Bathing;Carry;Continence;Toileting;Dressing;Lift;Stand;Stairs;Squat;Reach Overhead;Locomotion Level;Transfers    Examination-Participation Restrictions Cleaning;Community Activity;Driving;Interpersonal Relationship;Laundry;Shop;Occupation;Yard Work;Meal Prep    Stability/Clinical Decision Making Evolving/Moderate complexity    Rehab Potential Good    PT Frequency 2x / week    PT Duration 12 weeks    PT Treatment/Interventions ADLs/Self Care Home Management;Aquatic Therapy;DME Instruction;Gait training;Stair training;Therapeutic activities;Therapeutic exercise;Balance training;Neuromuscular re-education;Patient/family education;Vestibular    PT Next Visit Plan add to HEP for balance - marching, stepping strategy if pt able to perform safely with wife's assistance; gait train with RW. Standing weight shifting, balance with decr UE  support. Weight bearing activities for ataxia.    PT Home Exercise Plan [id number]    Consulted and Agree with Plan of Care Patient;Family member/caregiver    Family Member Consulted wife, Lennox LaityJodi             Patient will benefit from skilled therapeutic intervention in order to improve the following deficits and impairments:  Abnormal gait, Decreased activity tolerance, Decreased balance, Decreased coordination, Dizziness, Impaired sensation, Impaired UE functional use, Impaired vision/preception  Visit Diagnosis: Other abnormalities of gait and mobility  Unsteadiness on feet  Other lack of coordination     Problem List Patient Active Problem List   Diagnosis Date Noted   Urinary retention 09/27/2021   Diplopia 09/27/2021   Carotid stenosis, right 09/27/2021   Anemia 09/27/2021   Dyslipidemia    Slow transit constipation    Benign essential HTN    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   Acute ischemic stroke  [facility]CC)    Cerebral embolism with cerebral infarction 08/30/2021   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

## 2021-11-05 ENCOUNTER — Other Ambulatory Visit (HOSPITAL_COMMUNITY): Payer: Self-pay | Admitting: Interventional Radiology

## 2021-11-05 ENCOUNTER — Telehealth (HOSPITAL_COMMUNITY): Payer: Self-pay

## 2021-11-05 ENCOUNTER — Encounter: Payer: Self-pay | Admitting: Occupational Therapy

## 2021-11-05 ENCOUNTER — Encounter: Payer: Self-pay | Admitting: Physical Medicine & Rehabilitation

## 2021-11-05 ENCOUNTER — Ambulatory Visit: Payer: 59 | Admitting: Occupational Therapy

## 2021-11-05 ENCOUNTER — Ambulatory Visit: Payer: 59 | Admitting: Physical Therapy

## 2021-11-05 ENCOUNTER — Other Ambulatory Visit: Payer: Self-pay

## 2021-11-05 VITALS — BP 129/77 | HR 113

## 2021-11-05 DIAGNOSIS — R208 Other disturbances of skin sensation: Secondary | ICD-10-CM

## 2021-11-05 DIAGNOSIS — R2689 Other abnormalities of gait and mobility: Secondary | ICD-10-CM | POA: Diagnosis not present

## 2021-11-05 DIAGNOSIS — R29818 Other symptoms and signs involving the nervous system: Secondary | ICD-10-CM

## 2021-11-05 DIAGNOSIS — R2681 Unsteadiness on feet: Secondary | ICD-10-CM

## 2021-11-05 DIAGNOSIS — R278 Other lack of coordination: Secondary | ICD-10-CM

## 2021-11-05 DIAGNOSIS — I771 Stricture of artery: Secondary | ICD-10-CM

## 2021-11-05 DIAGNOSIS — R41842 Visuospatial deficit: Secondary | ICD-10-CM

## 2021-11-05 DIAGNOSIS — R270 Ataxia, unspecified: Secondary | ICD-10-CM

## 2021-11-05 NOTE — Therapy (Signed)
[facility] [address] [place], [place], [address] Phone: [phone]   Fax:  [phone]  Physical Therapy Treatment  Patient Details  Name: Maurice Little MRN: [id number] Date of Birth: [birth year] Referring Provider (PT): Mariam Dollar, PA-C   Encounter Date: 11/05/2021   PT End of Session - 11/05/21 2011     Visit Number 5    Number of Visits 25    Date for PT Re-Evaluation 01/11/22    Authorization Type [employer] ([place] with no auth required)    PT Start Time 1020    PT Stop Time 1102    PT Time Calculation (min) 42 min    Equipment Utilized During Treatment Gait belt    Activity Tolerance Patient tolerated treatment well   limited by pain from bladder spasms   Behavior During Therapy [facility] for tasks assessed/performed             Past Medical History:  Diagnosis Date   Benign prostatic hyperplasia with elevated prostate specific antigen (PSA)    Hypertension    Iliac artery stenosis, left (HCC)    with claudication resolved with stent   PAD (peripheral artery disease) (HCC)     Past Surgical History:  Procedure Laterality Date   BUBBLE STUDY  08/31/2021   Procedure: BUBBLE STUDY;  Surgeon: Chilton Si, MD;  Location: [facility] ENDOSCOPY;  Service: Cardiovascular;;   ILIAC ARTERY STENT Left    IR ANGIO VERTEBRAL SEL SUBCLAVIAN INNOMINATE UNI L MOD SED  08/30/2021   IR ANGIO VERTEBRAL SEL SUBCLAVIAN INNOMINATE UNI R MOD SED  08/30/2021   IR CT HEAD LTD  08/30/2021   IR INTRA CRAN STENT  08/30/2021   IR US GUIDE VASC ACCESS RIGHT  08/30/2021   RADIOLOGY WITH ANESTHESIA N/A 08/30/2021   Procedure: IR WITH ANESTHESIA;  Surgeon: Julieanne Cotton, MD;  Location: [facility] OR;  Service: Radiology;  Laterality: N/A;   TEE WITHOUT CARDIOVERSION N/A 08/31/2021   Procedure: TRANSESOPHAGEAL ECHOCARDIOGRAM (TEE);  Surgeon: Chilton Si, MD;  Location: [facility] ENDOSCOPY;  Service: Cardiovascular;  Laterality: N/A;    Vitals:    11/05/21 1030 11/05/21 1055  BP: 124/77 129/77  Pulse: 97 (!) 113     Subjective Assessment - 11/05/21 1928     Subjective Pt reports he has had increased dizziness (rates dizziness 4/10 at current time, but states it was 7-8/10 when it suddenly occurred yesterday); pt states he was sitting on sofa and his wife [name]) assisted him with standing in order to transfer and he suddenly became dizzy; did not take BP at home when it happened.  Pt states it is not as severe as it was when it first started but states it is more than it was prior to this recent episode (states dizziness had been about a 2/10 intensity)    Patient is accompained by: Family member   [name] (wife)   Pertinent History Basilar artery stenosis, urinary retention with indwelling catheter, neurogenic bladder, diplopia, Rt cerebellar infarct, HTN, Rt carotid stenosis, anemia, s/p cerebral angiogram with stent    Limitations Lifting;Standing;Walking;House hold activities;Reading    Patient Stated Goals "I want to be normal again - walk without walker", decrease numbness on Rt side of body    Currently in Pain? No/denies    Pain Onset 1 to 4 weeks ago  [facility] Adult PT Treatment/Exercise - 11/05/21 1034       Transfers   Transfers Sit to Stand;Stand to Sit    Sit to Stand 4: Min guard   no UE support used - from mat   Stand to Sit 4: Min guard    Squat Pivot Transfers 5: Supervision   from wheelchair to mat   Number of Reps Other reps (comment)   4 reps during session   Comments no UE support used from mat - occasional mod to min assist given for initial stabilization upon standing      Ambulation/Gait   Ambulation/Gait Yes    Ambulation/Gait Assistance 4: Min guard    Ambulation Distance (Feet) 230 Feet   100' on 2nd rep of gait training   Assistive device Rolling walker    Gait Pattern Step-through pattern;Ataxic;Decreased weight shift to right;Decreased weight shift to  left;Wide base of support    Ambulation Surface Level;Indoor                 Balance Exercises - 11/05/21 0001       Balance Exercises: Standing   Standing Eyes Opened Wide (BOA);Head turns;5 reps   horizontal and vertical head turns - 5 reps each with minimal UE support on counter   Marching Solid surface;Upper extremity assist 1;Static;10 reps   at counter   Other Standing Exercises Pt performed standing alternating forward, back & side kicks 10 reps each leg; performed stepping over and back of orange hurdle (lower height) 5 reps each leg with UE support on counter; performed touching target (balance bubble) 5 reps each foot with 1 UE support on counter with CGA                  PT Short Term Goals - 11/05/21 2017       PT SHORT TERM GOAL #1   Title Pt will perform basic transfers wheelchair to/from mat using squat pivot transfer with supervision.    Baseline CGA    Time 4    Period Weeks    Status New    Target Date 11/16/21      PT SHORT TERM GOAL #2   Title Pt will improve Berg score from 27/56 to >/= 32/56 to demo improved balance with reduced fall risk.    Baseline 27/56    Time 4    Period Weeks    Status New    Target Date 11/16/21      PT SHORT TERM GOAL #3   Title Improve TUG score from 1".07 secs to </= 50 secs with RW with CGA.    Baseline 1" 7 secs with RW    Time 4    Period Weeks    Status New    Target Date 11/16/21      PT SHORT TERM GOAL #4   Title Perform sit to stand from mat to RW with SBA with normal BOS.    Time 4    Period Weeks    Status New    Target Date 11/16/21      PT SHORT TERM GOAL #5   Title Amb. 14115' with RW with CGA on flat, even surface.    Time 4    Period Weeks    Status New    Target Date 11/16/21      PT SHORT TERM GOAL #6   Title Perform HEP for standing balance with wife's assistance.    Time 4    Period Weeks  Status New    Target Date 11/16/21               PT Long Term Goals -  11/05/21 2017       PT LONG TERM GOAL #1   Title Pt will be modified independent with household amb. without use of assistive device.    Time 12    Period Weeks    Status New    Target Date 01/11/22      PT LONG TERM GOAL #2   Title Pt will amb. 1000' with SPC with SBA on flat even and uneven surfaces for increased community accessibility.    Time 12    Period Weeks    Status New    Target Date 01/11/22      PT LONG TERM GOAL #3   Title Improve TUG score to </= 25 secs with appropriate assistive device to demo improved functional mobility and to reduce fall risk.    Baseline 1".07 secs with RW - 10-16-21    Time 12    Period Weeks    Status New    Target Date 01/11/22      PT LONG TERM GOAL #4   Title Increase Berg score to >/= 45/56 to reduce fall risk.    Baseline 27/56    Time 12    Period Weeks    Status New    Target Date 01/11/22      PT LONG TERM GOAL #5   Title Increase gait velocity from .43 ft/sec to >/= 1.6 ft/sec with RW for increased gait efficiency.    Time 12    Period Weeks    Status New    Target Date 01/11/22      PT LONG TERM GOAL #6   Title Independent in updated HEP as appropriate.    Time 12    Period Weeks    Status New    Target Date 01/11/22                   Plan - 11/05/21 2012     Clinical Impression Statement Pt attempted to perform x1 viewing exercise in seated position but reported diplopia when he turned head to left (Rt eye) so this exercise was discontinued.  Pt performed standing with min UE support on RW and looked at a target on Rt side/Lt side and continued to report double vision when he turned head toward Lt side.  Dizziness remained constant during session (4/10 per pt report);  BP WNL's during session.  Activity tolerance was good even though dizziness was increased.  Cont with POC.    Personal Factors and Comorbidities Comorbidity 2;Profession    Comorbidities basilar artery stenosis, dyslipidemia,  HTN,  neurogenic bladder, diplopia, anemia, Lt iliac artery stenosis, PAD    Examination-Activity Limitations Bathing;Carry;Continence;Toileting;Dressing;Lift;Stand;Stairs;Squat;Reach Overhead;Locomotion Level;Transfers    Examination-Participation Restrictions Cleaning;Community Activity;Driving;Interpersonal Relationship;Laundry;Shop;Occupation;Yard Work;Meal Prep    Stability/Clinical Decision Making Evolving/Moderate complexity    Rehab Potential Good    PT Frequency 2x / week    PT Duration 12 weeks    PT Treatment/Interventions ADLs/Self Care Home Management;Aquatic Therapy;DME Instruction;Gait training;Stair training;Therapeutic activities;Therapeutic exercise;Balance training;Neuromuscular re-education;Patient/family education;Vestibular    PT Next Visit Plan add to HEP for balance - marching, stepping strategy if pt able to perform safely with wife's assistance; gait train with RW. Standing weight shifting, balance with decr UE support. Weight bearing activities for ataxia.    PT Home Exercise Plan [id number]    Consulted and Agree  with Plan of Care Patient;Family member/caregiver    Family Member Consulted wife, [name]             Patient will benefit from skilled therapeutic intervention in order to improve the following deficits and impairments:  Abnormal gait, Decreased activity tolerance, Decreased balance, Decreased coordination, Dizziness, Impaired sensation, Impaired UE functional use, Impaired vision/preception  Visit Diagnosis: Other abnormalities of gait and mobility  Unsteadiness on feet     Problem List Patient Active Problem List   Diagnosis Date Noted   Urinary retention 09/27/2021   Diplopia 09/27/2021   Carotid stenosis, right 09/27/2021   Anemia 09/27/2021   Dyslipidemia    Slow transit constipation    Benign essential HTN    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   Acute ischemic stroke [facility])    Cerebral embolism with cerebral infarction 08/30/2021   Occlusion  and stenosis of basilar artery with cerebral infarction [facility]) 08/30/2021   Basilar artery stenosis 08/29/2021    VOZDGU, YQIHK VQQVZDG, PT 11/05/2021, 8:57 PM  [facility] [address] [place], [place], [address] Phone: [phone]   Fax:  [phone]  Name: Kannon Granderson MRN: [id number] Date of Birth: February 20, 1956

## 2021-11-05 NOTE — Progress Notes (Signed)
Cardiology Office Note:    Date:  11/06/2021   ID:  Maurice Little, DOB 04/08/1956, MRN [id number]  PCP:  de Peruuba, Raymond J, MD   [facility] Providers Cardiologist:  None     Referring MD: Herma CarsonJones, Jason D, MD   CC: Secondary stroke prevention Consulted for the evaluation of stroke etiology at the behest of de Peruuba, Buren Kosaymond J, MD  History of Present Illness:    Maurice Little is a 66 y.o. male with a hx of HTN, FH, PAD s/p Ilaiac Artery Stenosis (previously saw Dr. Lucky Cowboy[facility] with 2016 10 x 37 mm expandable stent to the left common iliac artery 95% stenosis.  20% right common iliac artery stenosis)  Had a 11/8//22 Cerebellar stroke with small PFO seen.  Seen for evaluation 11/06/21.  Patient notes that he has multiple strokes together 08/29/22.  He still has significant deficits.  Notes that he has numbness from the top of his head to the tip of his toes.  Has ataxia and residual dizziness that is always presents, with getting up yesterday was an 8/10.  Notes that he has graduated to Outpatient PT and OT and may  Start water therapy in February.  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  Notes that he still have dysarthria, and diplopia but they are improving.  Still have dizziness walking to the chair. Is working on strengthening his eye site.  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  He and his wife were unaware of his FH.  Has no children and a half brother with no medical problems. They did not have a heart monitor worn.  Past Medical History:  Diagnosis Date   Benign prostatic hyperplasia with elevated prostate specific antigen (PSA)    Hypertension    Iliac artery stenosis, left (HCC)    with claudication resolved with stent   PAD (peripheral artery disease) (HCC)     Past Surgical History:  Procedure Laterality Date   BUBBLE STUDY  08/31/2021   Procedure: BUBBLE STUDY;  Surgeon: Chilton Siandolph, Tiffany, MD;  [facility];   Service: Cardiovascular;;   ILIAC ARTERY STENT Left    IR ANGIO VERTEBRAL SEL SUBCLAVIAN INNOMINATE UNI L MOD SED  08/30/2021   IR ANGIO VERTEBRAL SEL SUBCLAVIAN INNOMINATE UNI R MOD SED  08/30/2021   IR CT HEAD LTD  08/30/2021   IR INTRA CRAN STENT  08/30/2021   IR US GUIDE VASC ACCESS RIGHT  08/30/2021   RADIOLOGY WITH ANESTHESIA N/A 08/30/2021   Procedure: IR WITH ANESTHESIA;  Surgeon: Julieanne Cottoneveshwar, Sanjeev, MD;  Location: [facility] OR;  Service: Radiology;  Laterality: N/A;   TEE WITHOUT CARDIOVERSION N/A 08/31/2021   Procedure: TRANSESOPHAGEAL ECHOCARDIOGRAM (TEE);  Surgeon: Chilton Siandolph, Tiffany, MD;  [facility];  Service: Cardiovascular;  Laterality: N/A;    Current Medications: Current Meds  Medication Sig   acetaminophen (TYLENOL) 325 MG tablet Take 1-2 tablets (325-650 mg total) by mouth every 4 (four) hours as needed for mild pain.   amLODipine (NORVASC) 5 MG tablet Take 1 tablet (5 mg total) by mouth daily after supper.   artificial tears (LACRILUBE) OINT ophthalmic ointment Place into both eyes at bedtime.   aspirin EC 81 MG tablet Take 1 tablet (81 mg total) by mouth daily. Swallow whole.   citalopram (CELEXA) 10 MG tablet Take 1 tablet (10 mg total) by mouth daily.   Cyanocobalamin (B-12 PO) Take 1 tablet by mouth daily.   hydrALAZINE (  APRESOLINE) 10 MG tablet Take 1 tablet (10 mg total) by mouth every 8 (eight) hours.   lidocaine (XYLOCAINE) 2 % jelly Apply topically.   losartan (COZAAR) 25 MG tablet Take 1 tablet (25 mg total) by mouth daily.   naphazoline-glycerin (CLEAR EYES REDNESS) 0.012-0.25 % SOLN Place 1-2 drops into the right eye 4 (four) times daily -  with meals and at bedtime.   pantoprazole (PROTONIX) 40 MG tablet Take 1 tablet (40 mg total) by mouth daily. (Patient taking differently: Take 40 mg by mouth as needed.)   rosuvastatin (CRESTOR) 40 MG tablet Take 1 tablet (40 mg total) by mouth daily.   senna-docusate (SENOKOT-S) 8.6-50 MG tablet Take 2 tablets by  mouth 2 (two) times daily.   ticagrelor (BRILINTA) 90 MG TABS tablet Take 1 tablet (90 mg total) by mouth 2 (two) times daily.     Allergies:   Duloxetine   Social History   Socioeconomic History   Marital status: Married    Spouse name: Not on file   Number of children: Not on file   Years of education: Not on file   Highest education level: Not on file  Occupational History   Not on file  Tobacco Use   Smoking status: Former    Types: Cigarettes   Smokeless tobacco: Never  Vaping Use   Vaping Use: Never used  Substance and Sexual Activity   Alcohol use: Not Currently   Drug use: Not on file   Sexual activity: Not on file  Other Topics Concern   Not on file  Social History Narrative   Not on file   Social Determinants of Health   Financial Resource Strain: Not on file  Food Insecurity: Not on file  Transportation Needs: Not on file  Physical Activity: Not on file  Stress: Not on file  Social Connections: Not on file    Social: Comes with wife no kids  Family History: The patient's family history includes Healthy in his mother. No prior history of CAD in family  ROS:   Please see the history of present illness.     All other systems reviewed and are negative.  EKGs/Labs/Other Studies Reviewed:    The following studies were reviewed today:  Cardiac Event Monitoring: Do not have study or results  Transthoracic Echocardiogram: Date: 08/30/21 Results:  1. Left ventricular ejection fraction, by estimation, is 60 to 65%. The  left ventricle has normal function. The left ventricle has no regional  wall motion abnormalities. Left ventricular diastolic parameters are  consistent with Grade II diastolic  dysfunction (pseudonormalization).   2. Right ventricular systolic function is normal. The right ventricular  size is normal. Tricuspid regurgitation signal is inadequate for assessing  PA pressure.   3. The mitral valve is normal in structure. No evidence of  mitral valve  regurgitation. No evidence of mitral stenosis.   4. The aortic valve is tricuspid. Aortic valve regurgitation is not  visualized. Mild aortic valve sclerosis is present, with no evidence of  aortic valve stenosis.   5. The inferior vena cava is normal in size with greater than 50%  respiratory variability, suggesting right atrial pressure of 3 mmHg.   Transesophageal Echocardiogram: Date: 08/31/21 Results:  Moderate Bubbles ~ 8 beats in with moderate descending aortic atheroma  1. Left ventricular ejection fraction, by estimation, is 55 to 60%. The  left ventricle has normal function. The left ventricle has no regional  wall motion abnormalities.   2. Right ventricular systolic  function is normal. The right ventricular  size is normal.   3. No left atrial/left atrial appendage thrombus was detected.   4. The mitral valve is normal in structure. Trivial mitral valve  regurgitation. No evidence of mitral stenosis.   5. The aortic valve is tricuspid. Aortic valve regurgitation is not  visualized. No aortic stenosis is present.   6. There is Moderate (Grade III) atheroma plaque involving the descending  aorta.   7. The inferior vena cava is normal in size with greater than 50%  respiratory variability, suggesting right atrial pressure of 3 mmHg.   8. Agitated saline contrast bubble study was positive with shunting  observed within 3-6 cardiac cycles suggestive of interatrial shunt. There  is a small patent foramen ovale with predominantly right to left shunting  across the atrial septum.    Recent Labs: 09/05/2021: ALT 44; Magnesium 2.1 09/24/2021: BUN 15; Creatinine, Ser 1.09; Hemoglobin 10.9; Platelets 311; Potassium 4.5; Sodium 135  Recent Lipid Panel    Component Value Date/Time   CHOL 288 (H) 08/29/2021 2340   TRIG 82 08/29/2021 2340   HDL 52 08/29/2021 2340   CHOLHDL 5.5 08/29/2021 2340   VLDL 16 08/29/2021 2340   LDLCALC 220 (H) 08/29/2021 2340         Physical Exam:    VS:  BP 130/70    Pulse 73    Ht 6' (1.829 m)    Wt 92.3 kg    SpO2 98%    BMI 27.59 kg/m     Wt Readings from Last 3 Encounters:  11/06/21 92.3 kg  09/25/21 94.9 kg  08/29/21 92 kg     Gen: Mild distress   Neck: No JVD, Bilateral soft carotid bruit Ears: Bilateral Homero Fellers Sign Cardiac: No Rubs or Gallops, no Murmur, regular rhythm, +2 radial pulses Respiratory: Clear to auscultation bilaterally, normal effort, normal  respiratory rate GI: Soft, nontender, non-distended  MS: No edema;  moves all extremities Integument: Skin feels warm Neuro:  At time of evaluation, alert and oriented to person/place/time/situation, denies diplopia in evaluation with me today Psych: Anxious affect, patient feels depressed   ASSESSMENT:    No diagnosis found. PLAN:    Cerebellar stroke PFO with Rope Score of  4 Aortic atherosclerosis Familial Hypercholesterolemia Moderate bilateral CAS PAD - BP at goal on current meds - on DAPT per IR for 11/22 stenting - will do heart monitor to f/u AF - lipids today, sending to lipid clinic as he would benefit from [facility] for prevention - At this point, evaluation of his vascular disease and optimizing prevention seems to be the best for stroke prevention - if no AF, we can discuss the pros and cons of small PFO closure. - patient is working to schedule follow up CAS eval - discussed pros and cons of Lp(a) for familial eval; deferred at this time  3-4 months me or APP        Medication Adjustments/Labs and Tests Ordered: Current medicines are reviewed at length with the patient today.  Concerns regarding medicines are outlined above.  No orders of the defined types were placed in this encounter.  No orders of the defined types were placed in this encounter.   There are no Patient Instructions on file for this visit.   Signed, Christell Constant, MD  11/06/2021 11:35 AM    [facility]

## 2021-11-05 NOTE — Therapy (Signed)
Maurice Little [address] [place], [place], [address] Phone: [phone]   Fax:  [phone]  Occupational Therapy Treatment  Patient Details  Name: Maurice Little MRN: [id number] Date of Birth: 02-11-1956 Referring Provider (OT): Lauraine Rinne, PA-C   Encounter Date: 11/05/2021   OT End of Session - 11/05/21 0933     Visit Number 9    Number of Visits 25    Date for OT Re-Evaluation 12/31/21    Authorization Type UHC--no visit limit/no auth, copay per day    OT Start Time 0934    OT Stop Time 1015    OT Time Calculation (min) 41 min    Activity Tolerance Patient tolerated treatment well    Behavior During Therapy [facility] for tasks assessed/performed             Past Medical History:  Diagnosis Date   Benign prostatic hyperplasia with elevated prostate specific antigen (PSA)    Hypertension    Iliac artery stenosis, left ([place])    with claudication resolved with stent   PAD (peripheral artery disease) ([place])     Past Surgical History:  Procedure Laterality Date   BUBBLE STUDY  08/31/2021   Procedure: BUBBLE STUDY;  Surgeon: Skeet Latch, MD;  Location: [place];  Service: Cardiovascular;;   ILIAC ARTERY STENT Left    IR ANGIO VERTEBRAL SEL SUBCLAVIAN INNOMINATE UNI L MOD SED  08/30/2021   IR ANGIO VERTEBRAL SEL SUBCLAVIAN INNOMINATE UNI R MOD SED  08/30/2021   IR CT HEAD LTD  08/30/2021   IR INTRA CRAN STENT  08/30/2021   IR US GUIDE VASC ACCESS RIGHT  08/30/2021   RADIOLOGY WITH ANESTHESIA N/A 08/30/2021   Procedure: IR WITH ANESTHESIA;  Surgeon: Luanne Bras, MD;  Location: [place];  Service: Radiology;  Laterality: N/A;   TEE WITHOUT CARDIOVERSION N/A 08/31/2021   Procedure: TRANSESOPHAGEAL ECHOCARDIOGRAM (TEE);  Surgeon: Skeet Latch, MD;  Location: [facility];  Service: Cardiovascular;  Laterality: N/A;    There were no vitals filed for this visit.   Subjective Assessment - 11/05/21  0933     Subjective  Pt reports more dizziness today.  Pt also reports more diplopia today with far objects (not up close)    Pertinent History CVA (R cereballar and R small R brachium potis and R pontomedullary junction).   PMH:  dyslipidemia, HTN, carotid stenosis, anemia, PAD    Limitations ataxia, diplopia, folley catheter, decr hearing (particularly R side)    Patient Stated Goals incr independence, get better    Currently in Pain? No/denies              Closed-chain shoulder flex and diagonals to each side with min cueing for wt. Shift with diagonals and positioning.  Prone, scapular exercises with shoulders in ext and abduction with min v.c. followed by wt. Bearing on elbows with chest/head lift for scapular depression/stability.  Quadruped:  alternating UE lift, forward/back wt. Shifts with min cueing   Tall kneeling, with closed-chain shoulder flex with min A for balance and pt reports 4/10 dizziness.  Pt reports need to rest due to incr dizziness with quadruped/tall kneeling today.  Sitting, functional reaching with RUE to place smalls pegs in vertical pegboard with occasional min cueing for shoulder hike, midline alignment, min difficulty with ataxia/coordination, copied with design  placed to the R with no reports of diplopia and good accuracy.  Removing using in-hand manipulation to remove 3 and then place in container 1 at  a time with min difficulty/incr time.  Typing Test with 44% accuracy and 6wpm with 2wpm net speed.  Then 2nd attempt Typing test with 88% accuracy, 8wpm, 7 wpm net speed.         OT Short Term Goals - 11/01/21 1243       OT SHORT TERM GOAL #1   Title Pt will be independent with initial HEP.--check STGs 11/02/21    Time 4    Period Weeks    Status Achieved      OT SHORT TERM GOAL #2   Title Pt will perform BADLs mod I except supervision for shower transfer.    Time 4    Period Weeks    Status On-going   min A for back, supervision for  standing to pull up pants     OT SHORT TERM GOAL #3   Title Pt will perform tabletop visual scanning with 90% accuracy and no reports of diplopia.    Time 4    Period Weeks    Status Achieved      OT SHORT TERM GOAL #4   Title Pt will verbalize understanding of visual compensation strategies for ADLs/IADLs prn (including ?taping, head turns/positioning)    Time 4    Period Weeks    Status Achieved   Pt/ wife verbalize understanding     OT SHORT TERM GOAL #5   Title Pt will improve coordination for ADLs as shown by improving time on 9-hole peg test by at least 5sec bilaterally.    Baseline R-66.85sec, L-41.25sec    Time 4    Period Weeks    Status On-going   R 65.25-not met yet improvedd, L 33.60- met partially met, met for LUE              OT Long Term Goals - 11/05/21 1000       OT LONG TERM GOAL #1   Title Pt will be independent with updated HEP.    Time 12    Period Weeks    Status New      OT LONG TERM GOAL #2   Title Pt will perform simple snack prep/home maintenance task with supervision.    Time 12    Period Weeks    Status New      OT LONG TERM GOAL #3   Title Pt will perform environmental scanning/navigation with at least 90% accuracy for incr safety in the home/community.    Time 12    Period Weeks    Status New      OT LONG TERM GOAL #4   Title Pt will improve RUE coordination as shown by improving time on 9-hole peg test by at least 15sec.    Baseline 66.85sec    Time 12    Period Weeks    Status New      OT LONG TERM GOAL #5   Title Pt will be able to retrieve/replace 1-2 objects on overhead shelf with each UE x10 safetly and with good control for IADL tasks.    Time 12    Period Weeks    Status New      OT LONG TERM GOAL #6   Title Pt will be able to type with at least 80% accuracy with at least 20wpm in prep for work tasks.    Baseline 39% with 10wpm    Time 12    Period Weeks    Status New      OT LONG TERM GOAL #  7   Title Pt will  be able to write at least 4 sentences with 100% legibility in reasonable amount of time.    Time 12    Period Weeks    Status New                   Plan - 11/05/21 0933     Clinical Impression Statement Pt is progressing towards goals with improving UE control , fine motor coordination and fucntional use.  However, pt reports incr dizziness/diplopia today affecting therapy activities.    OT Occupational Profile and History Detailed Assessment- Review of Records and additional review of physical, cognitive, psychosocial history related to current functional performance    Occupational performance deficits (Please refer to evaluation for details): ADL's;IADL's;Work;Leisure    Body Structure / Function / Physical Skills ADL;UE functional use;Balance;FMC;Coordination;Sensation;IADL;Decreased knowledge of use of DME;Dexterity;Vision    Rehab Potential Good    Clinical Decision Making Several treatment options, min-mod task modification necessary    Comorbidities Affecting Occupational Performance: May have comorbidities impacting occupational performance    Modification or Assistance to Complete Evaluation  Min-Moderate modification of tasks or assist with assess necessary to complete eval    OT Frequency 2x / week    OT Duration 12 weeks   +eval   OT Treatment/Interventions Self-care/ADL training;Energy conservation;Ultrasound;Visual/perceptual remediation/compensation;Patient/family education;DME and/or AE instruction;Aquatic Therapy;Paraffin;Passive range of motion;Balance training;Cryotherapy;Functional Mobility Training;Moist Heat;Therapeutic exercise;Manual Therapy;Therapeutic activities;Neuromuscular education    Plan neuro re-ed , coordination, prone/quadraped    OT Home Exercise Plan Pt reports that he has some coordination activities and visual HEP from hospital    Consulted and Agree with Plan of Care Patient;Family member/caregiver    Family Member Consulted wife              Patient will benefit from skilled therapeutic intervention in order to improve the following deficits and impairments:   Body Structure / Function / Physical Skills: ADL, UE functional use, Balance, FMC, Coordination, Sensation, IADL, Decreased knowledge of use of DME, Dexterity, Vision       Visit Diagnosis: Other lack of coordination  Other symptoms and signs involving the nervous system  Visuospatial deficit  Other disturbances of skin sensation  Ataxia  Other abnormalities of gait and mobility  Unsteadiness on feet    Problem List Patient Active Problem List   Diagnosis Date Noted   Urinary retention 09/27/2021   Diplopia 09/27/2021   Carotid stenosis, right 09/27/2021   Anemia 09/27/2021   Dyslipidemia    Slow transit constipation    Benign essential HTN    Cerebellar stroke ([place]) 09/04/2021   Acute ischemic stroke [facility])    Cerebral embolism with cerebral infarction 08/30/2021   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 [place], [place]  [address] [phone] phone [phone] 11/05/21 10:23 AM

## 2021-11-05 NOTE — Telephone Encounter (Signed)
Called to schedule consult, no answer, left vm. AW  

## 2021-11-06 ENCOUNTER — Encounter (HOSPITAL_BASED_OUTPATIENT_CLINIC_OR_DEPARTMENT_OTHER): Payer: Self-pay | Admitting: Family Medicine

## 2021-11-06 ENCOUNTER — Ambulatory Visit (INDEPENDENT_AMBULATORY_CARE_PROVIDER_SITE_OTHER): Payer: 59

## 2021-11-06 ENCOUNTER — Ambulatory Visit: Payer: 59 | Admitting: Internal Medicine

## 2021-11-06 ENCOUNTER — Ambulatory Visit (HOSPITAL_BASED_OUTPATIENT_CLINIC_OR_DEPARTMENT_OTHER): Payer: 59 | Admitting: Family Medicine

## 2021-11-06 ENCOUNTER — Encounter: Payer: Self-pay | Admitting: Internal Medicine

## 2021-11-06 ENCOUNTER — Other Ambulatory Visit: Payer: Self-pay | Admitting: Internal Medicine

## 2021-11-06 VITALS — BP 130/70 | HR 73 | Ht 72.0 in | Wt 203.4 lb

## 2021-11-06 VITALS — BP 132/78 | HR 66 | Ht 72.0 in | Wt 204.6 lb

## 2021-11-06 DIAGNOSIS — E7801 Familial hypercholesterolemia: Secondary | ICD-10-CM

## 2021-11-06 DIAGNOSIS — I4891 Unspecified atrial fibrillation: Secondary | ICD-10-CM

## 2021-11-06 DIAGNOSIS — I10 Essential (primary) hypertension: Secondary | ICD-10-CM

## 2021-11-06 DIAGNOSIS — R42 Dizziness and giddiness: Secondary | ICD-10-CM

## 2021-11-06 DIAGNOSIS — I639 Cerebral infarction, unspecified: Secondary | ICD-10-CM

## 2021-11-06 DIAGNOSIS — Q2112 Patent foramen ovale: Secondary | ICD-10-CM

## 2021-11-06 LAB — LIPID PANEL
Chol/HDL Ratio: 2.6 ratio (ref 0.0–5.0)
Cholesterol, Total: 126 mg/dL (ref 100–199)
HDL: 48 mg/dL (ref >39)
LDL Chol Calc (NIH): 56 mg/dL (ref 0–99)
Triglycerides: 123 mg/dL (ref 0–149)
VLDL Cholesterol Cal: 22 mg/dL (ref 5–40)

## 2021-11-06 LAB — ALT: ALT: 16 IU/L (ref 0–44)

## 2021-11-06 NOTE — Patient Instructions (Signed)
°  Medication Instructions:  Your physician recommends that you continue on your current medications as directed. Please refer to the Current Medication list given to you today. --If you need a refill on any your medications before your next appointment, please call your pharmacy first. If no refills are authorized on file call the office.-- Follow-Up: Your next appointment:   Your physician recommends that you schedule a follow-up appointment in: 6 MONTHS for a CPE with Dr. de Peru  You will receive a text message or e-mail with a link to a survey about your care and experience with [place] today! We would greatly appreciate your feedback!   Thanks for letting us be apart of your health journey!!  Primary Care and Sports Medicine   Dr. Ceasar Mons [place]   We encourage you to activate your patient portal called "MyChart".  Sign up information is provided on this After Visit Summary.  MyChart is used to connect with patients for Virtual Visits (Telemedicine).  Patients are able to view lab/test results, encounter notes, upcoming appointments, etc.  Non-urgent messages can be sent to your provider as well. To learn more about what you can do with MyChart, please visit --  [email or web address].

## 2021-11-06 NOTE — Progress Notes (Signed)
New Patient Office Visit  Subjective:  Patient ID: Maurice [name], male    DOB: 12-29-1955  Age: 66 y.o. MRN: [id number]  CC:  Chief Complaint  Patient presents with   Establish Care    Prior PCP - Dr. Marcy Salvo at [facility]   Medication Refill    Patient will need authorizations on maintenance medications. He had a stroke and is currently being managed by cardiology and outpatient rehab.    HPI Maurice [name] is a 66 year old male presenting to establish in clinic.  He has current concerns as outlined above.  Past medical history significant for hypertension, PAD, hyperlipidemia, recent cerebellar stroke.  Cerebellar stroke: Recent stroke in November 2022 which resulted in facial droop, right lateral gaze, right hearing loss, left-sided numbness.  Had follow-up with cardiology today with plan for event monitor to evaluate for underlying atrial fibrillation.  Also had labs completed to check lipid panel, also referred to lipid clinic for further management.  There will be consideration for PFO closure if no atrial fibrillation found.  He continues on dual antiplatelet therapy. Patient does have some residual hearing deficits, indicates that he has audiology referral placed previously, is still needing to schedule this.  Also has referral to neuro-ophthalmology, needing to schedule this as well  Hypertension: Current medications include amlodipine, hydralazine, losartan.  Has continued to have some dizziness since the stroke, denies any current issues related to chest pain.  Hyperlipidemia: Presumption is that stroke was related to atherosclerosis.  Currently has been taking rosuvastatin, is tolerating well.  Lipid panel checked with cardiology yesterday, results pending.  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  Completed high  school in [place].  He did serve in the [employer] for 4 years after high school.  Went to college at [employer] where he met his wife.  They recently moved to the area at the end of October, were living in [place], [place] previously.  He subsequently suffered from the stroke as above.  Shortly after moving to the area.  He worked in Librarian, academic for [employer]  Past Medical History:  Diagnosis Date   Benign prostatic hyperplasia with elevated prostate specific antigen (PSA)    Hypertension    Iliac artery stenosis, left ([facility])    with claudication resolved with stent   PAD (peripheral artery disease) ([place])     Past Surgical History:  Procedure Laterality Date   BUBBLE STUDY  08/31/2021   Procedure: BUBBLE STUDY;  Surgeon: Skeet Latch, MD;  Location: [place];  Service: Cardiovascular;;   ILIAC ARTERY STENT Left    IR ANGIO VERTEBRAL SEL SUBCLAVIAN INNOMINATE UNI L MOD SED  08/30/2021   IR ANGIO VERTEBRAL SEL SUBCLAVIAN INNOMINATE UNI R MOD SED  08/30/2021   IR CT HEAD LTD  08/30/2021   IR INTRA CRAN STENT  08/30/2021   IR US GUIDE VASC ACCESS RIGHT  08/30/2021   RADIOLOGY WITH ANESTHESIA N/A 08/30/2021   Procedure: IR WITH ANESTHESIA;  Surgeon: Luanne Bras, MD;  Location: [facility];  Service: Radiology;  Laterality: N/A;   TEE WITHOUT CARDIOVERSION N/A 08/31/2021   Procedure: TRANSESOPHAGEAL ECHOCARDIOGRAM (TEE);  Surgeon: Skeet Latch, MD;  Location: [facility] ENDOSCOPY;  Service: Cardiovascular;  Laterality: N/A;    Family History  Problem Relation Age of Onset   Healthy Mother     Social History  Socioeconomic History   Marital status: Married    Spouse name: Not on file   Number of children: Not on file   Years of education: Not on file   Highest education level: Not on file  Occupational History   Not on file  Tobacco Use   Smoking status: Former    Types: Cigarettes   Smokeless tobacco: Never  Vaping Use   Vaping Use: Never used   Substance and Sexual Activity   Alcohol use: Not Currently   Drug use: Not on file   Sexual activity: Not on file  Other Topics Concern   Not on file  Social History Narrative   Not on file   Social Determinants of Health   Financial Resource Strain: Not on file  Food Insecurity: Not on file  Transportation Needs: Not on file  Physical Activity: Not on file  Stress: Not on file  Social Connections: Not on file  Intimate Partner Violence: Not on file    Objective:   Today's Vitals: BP 132/78    Pulse 66    Ht 6' (1.829 m)    Wt 204 lb 9.6 oz (92.8 kg)    SpO2 99%    BMI 27.75 kg/m   Physical Exam  66 year old male in no acute distress Cardiovascular exam with regular rate and rhythm, no murmur appreciated Lungs clear to auscultation bilaterally  Assessment & Plan:   Problem List Items Addressed This Visit       Cardiovascular and Mediastinum   Benign essential HTN - Primary    Blood pressure adequately controlled in office today Continue with current regimen including amlodipine, hydralazine, losartan Continue close follow-up with cardiology regarding evaluation of stroke etiology        Other   Familial hypercholesteremia    Tolerating statin well, continue current therapy Has been referred to lipid clinic, recommend proceeding with evaluation, consideration for additional treatment for cholesterol       Outpatient Encounter Medications as of 11/06/2021  Medication Sig   acetaminophen (TYLENOL) 325 MG tablet Take 1-2 tablets (325-650 mg total) by mouth every 4 (four) hours as needed for mild pain.   amLODipine (NORVASC) 5 MG tablet Take 1 tablet (5 mg total) by mouth daily after supper.   artificial tears (LACRILUBE) OINT ophthalmic ointment Place into both eyes at bedtime.   aspirin EC 81 MG tablet Take 1 tablet (81 mg total) by mouth daily. Swallow whole.   citalopram (CELEXA) 10 MG tablet Take 1 tablet (10 mg total) by mouth daily.   Cyanocobalamin (B-12  PO) Take 1 tablet by mouth daily.   hydrALAZINE (APRESOLINE) 10 MG tablet Take 1 tablet (10 mg total) by mouth every 8 (eight) hours.   lidocaine (XYLOCAINE) 2 % jelly Apply topically.   losartan (COZAAR) 25 MG tablet Take 1 tablet (25 mg total) by mouth daily.   naphazoline-glycerin (CLEAR EYES REDNESS) 0.012-0.25 % SOLN Place 1-2 drops into the right eye 4 (four) times daily -  with meals and at bedtime.   pantoprazole (PROTONIX) 40 MG tablet Take 1 tablet (40 mg total) by mouth daily. (Patient taking differently: Take 40 mg by mouth as needed.)   rosuvastatin (CRESTOR) 40 MG tablet Take 1 tablet (40 mg total) by mouth daily.   senna-docusate (SENOKOT-S) 8.6-50 MG tablet Take 2 tablets by mouth 2 (two) times daily.   ticagrelor (BRILINTA) 90 MG TABS tablet Take 1 tablet (90 mg total) by mouth 2 (two) times daily.   No facility-administered  encounter medications on file as of 11/06/2021.   Spent 50 minutes on this patient encounter, including preparation, chart review, face-to-face counseling with patient and coordination of care, and documentation of encounter  Follow-up: Return in about 6 months (around 05/06/2022) for Follow Up.  Plan for CPE in 6 months, complete labs at that time  Shateria Paternostro [place], MD

## 2021-11-06 NOTE — Assessment & Plan Note (Signed)
Blood pressure adequately controlled in office today Continue with current regimen including amlodipine, hydralazine, losartan Continue close follow-up with cardiology regarding evaluation of stroke etiology

## 2021-11-06 NOTE — Patient Instructions (Signed)
Medication [name]:  Your physician recommends that you continue on your current medications as directed. Please refer to the Current Medication list given to you today.  *If you need a refill on your cardiac medications before your next appointment, please call your pharmacy*   Lab Work: TODAY: Lipid panel, ALT If you have labs (blood work) drawn today and your tests are completely normal, you will receive your results only by: MyChart Message (if you have MyChart) OR A paper copy in the mail If you have any lab test that is abnormal or we need to change your treatment, we will call you to review the results.   Testing/Procedures: Your physician has requested that you wear a heart monitor.   Your physician has referred you to our Lipid Clinic.    Follow-Up: At [facility], you and your health needs are our priority.  As part of our continuing mission to provide you with exceptional heart care, we have created designated Provider Care Teams.  These Care Teams include your primary Cardiologist (physician) and Advanced Practice Providers (APPs -  Physician Assistants and Nurse Practitioners) who all work together to provide you with the care you need, when you need it.    Your next appointment:   3 month(s)  The format for your next appointment:   In Person  Provider:   Christell Constant, Maurice [name]  or Maurice [name], Maurice [name] or Maurice [name], Maurice [name]          Other [name]  Maurice [name]  Your physician has requested you wear a ZIO patch monitor for 14 days.  This is a single patch monitor. [employer] supplies one patch monitor per enrollment. Additional stickers are not available. Please do not apply patch if you will be having a Nuclear Stress Test,  Echocardiogram, Cardiac CT, MRI, or Chest Xray during the period you would be wearing the  monitor. The patch cannot be worn during these tests. You cannot remove and re-apply the  ZIO XT patch  monitor.  Your ZIO patch monitor will be mailed 3 day USPS to your address on file. It may take 3-5 days  to receive your monitor after you have been enrolled.  Once you have received your monitor, please review the enclosed [name]. Your monitor  has already been registered assigning a specific monitor serial # to you.  Billing and Patient Assistance Program Information  We have supplied [employer] with any of your insurance information on file for billing purposes. [employer] offers a sliding scale Patient Assistance Program for patients that do not have  insurance, or whose insurance does not completely cover the cost of the ZIO monitor.  You must apply for the Patient Assistance Program to qualify for this discounted rate.  To apply, please call [employer] at [phone], select option 4, select option 2, ask to apply for  Patient Assistance Program. Maurice [name] will ask your household income, and how many people  are in your household. They will quote your out-of-pocket cost based on that information.  [employer] will also be able to set up a 14-month, interest-free payment plan if needed.  Applying the monitor   Shave hair from upper left chest.  Hold abrader disc by orange tab. Rub abrader in 40 strokes over the upper left chest as  indicated in your monitor [name].  Clean area with 4 enclosed alcohol pads. Let dry.  Apply patch as indicated in monitor [name]. Patch will be placed under collarbone on left  side  of chest with arrow pointing upward.  Rub patch adhesive wings for 2 minutes. Remove white label marked "1". Remove the white  label marked "2". Rub patch adhesive wings for 2 additional minutes.  While looking in a mirror, press and release button in center of patch. A small green light will  flash 3-4 times. This will be your only indicator that the monitor has been turned on.  Do not shower for the first 24 hours. You may shower after the first 24 hours.  Press the  button if you feel a symptom. You will hear a small click. Record Date, Time and  Symptom in the Patient Logbook.  When you are ready to remove the patch, follow [name] on the last 2 pages of Patient  Logbook. Stick patch monitor onto the last page of Patient Logbook.  Place Patient Logbook in the blue and white box. Use locking tab on box and tape box closed  securely. The blue and white box has prepaid postage on it. Please place it in the mailbox as  soon as possible. Your physician should have your test results approximately 7 days after the  monitor has been mailed back to [facility].  Call [facility] Customer Care at [phone] if you have questions regarding  your ZIO XT patch monitor. Call them immediately if you see an orange light blinking on your  monitor.  If your monitor falls off in less than 4 days, contact our Monitor department at [phone].  If your monitor becomes loose or falls off after 4 days call [employer] at [phone] for  suggestions on securing your monitor

## 2021-11-06 NOTE — Progress Notes (Unsigned)
Enrolled for [employer] to mail a ZIO XT long term holter monitor to the patients address on file.  

## 2021-11-06 NOTE — Assessment & Plan Note (Signed)
Tolerating statin well, continue current therapy Has been referred to lipid clinic, recommend proceeding with evaluation, consideration for additional treatment for cholesterol

## 2021-11-07 ENCOUNTER — Telehealth (HOSPITAL_COMMUNITY): Payer: Self-pay

## 2021-11-07 ENCOUNTER — Telehealth: Payer: Self-pay

## 2021-11-07 MED ORDER — EZETIMIBE 10 MG PO TABS: 10.0000 mg | ORAL_TABLET | Freq: Every day | ORAL | 3 refills | Status: DC

## 2021-11-07 NOTE — Telephone Encounter (Signed)
Called to schedule a f/u, no answer, left vm. AW

## 2021-11-07 NOTE — Telephone Encounter (Signed)
-----   Message from Christell Constant, MD sent at 11/07/2021  8:16 AM EST ----- Results: LDL nearly at goal ( < 55) Plan: Add Zetia 10 mg PO Daily Labs at next visit  Christell Constant, MD

## 2021-11-07 NOTE — Telephone Encounter (Signed)
The patient has been notified of the result and verbalized understanding.  All questions (if any) were answered. Arvid Right Jasmin Trumbull, RN 11/07/2021 1:11 PM   Pt questioning why has to start new medication when only 1 point from goal.  Advised pt that has the option to try diet and exercise or start zetia 10 mg PO QD.  Pt is agreeable to starting zetia.  Will come to 02/08/22 OV fasting for f/u labs.  All questions answered.

## 2021-11-08 ENCOUNTER — Ambulatory Visit: Payer: 59 | Admitting: Occupational Therapy

## 2021-11-08 ENCOUNTER — Telehealth (HOSPITAL_COMMUNITY): Payer: Self-pay

## 2021-11-08 ENCOUNTER — Encounter: Payer: Self-pay | Admitting: Occupational Therapy

## 2021-11-08 ENCOUNTER — Other Ambulatory Visit: Payer: Self-pay

## 2021-11-08 ENCOUNTER — Ambulatory Visit: Payer: 59 | Admitting: Physical Therapy

## 2021-11-08 DIAGNOSIS — R278 Other lack of coordination: Secondary | ICD-10-CM

## 2021-11-08 DIAGNOSIS — R270 Ataxia, unspecified: Secondary | ICD-10-CM

## 2021-11-08 DIAGNOSIS — R2689 Other abnormalities of gait and mobility: Secondary | ICD-10-CM | POA: Diagnosis not present

## 2021-11-08 DIAGNOSIS — R41842 Visuospatial deficit: Secondary | ICD-10-CM

## 2021-11-08 DIAGNOSIS — R29818 Other symptoms and signs involving the nervous system: Secondary | ICD-10-CM

## 2021-11-08 DIAGNOSIS — R208 Other disturbances of skin sensation: Secondary | ICD-10-CM

## 2021-11-08 DIAGNOSIS — R2681 Unsteadiness on feet: Secondary | ICD-10-CM

## 2021-11-08 NOTE — Telephone Encounter (Signed)
Returned call, no answer, left vm. AW 

## 2021-11-08 NOTE — Therapy (Signed)
[facility] [address] [place], [place], [address] Phone: [phone]   Fax:  [phone]  Occupational Therapy Treatment  Patient Details  Name: Isaiyah Feldhaus MRN: [id number] Date of Birth: [birth year] Referring Provider (OT): Lauraine Rinne, PA-C   Encounter Date: 11/08/2021   OT End of Session - 11/08/21 1019     Visit Number 10    Number of Visits 25    Date for OT Re-Evaluation 12/31/21    Authorization Type UHC--no visit limit/no auth, copay per day    OT Start Time 51    OT Stop Time 1100    OT Time Calculation (min) 41 min    Activity Tolerance Patient tolerated treatment well    Behavior During Therapy [facility] for tasks assessed/performed             Past Medical History:  Diagnosis Date   Benign prostatic hyperplasia with elevated prostate specific antigen (PSA)    Hypertension    Iliac artery stenosis, left ([facility])    with claudication resolved with stent   PAD (peripheral artery disease) ([facility])     Past Surgical History:  Procedure Laterality Date   BUBBLE STUDY  08/31/2021   Procedure: BUBBLE STUDY;  Surgeon: Skeet Latch, MD;  Location: [facility];  Service: Cardiovascular;;   ILIAC ARTERY STENT Left    IR ANGIO VERTEBRAL SEL SUBCLAVIAN INNOMINATE UNI L MOD SED  08/30/2021   IR ANGIO VERTEBRAL SEL SUBCLAVIAN INNOMINATE UNI R MOD SED  08/30/2021   IR CT HEAD LTD  08/30/2021   IR INTRA CRAN STENT  08/30/2021   IR US GUIDE VASC ACCESS RIGHT  08/30/2021   RADIOLOGY WITH ANESTHESIA N/A 08/30/2021   Procedure: IR WITH ANESTHESIA;  Surgeon: Luanne Bras, MD;  Location: [facility];  Service: Radiology;  Laterality: N/A;   TEE WITHOUT CARDIOVERSION N/A 08/31/2021   Procedure: TRANSESOPHAGEAL ECHOCARDIOGRAM (TEE);  Surgeon: Skeet Latch, MD;  Location: [facility];  Service: Cardiovascular;  Laterality: N/A;    There were no vitals filed for this visit.   Subjective Assessment - 11/08/21  1019     Subjective  Dizziness better today (baseline dizziness 3/10).  "It's a good day today"    Pertinent History CVA (R cereballar and R small R brachium potis and R pontomedullary junction).   PMH:  dyslipidemia, HTN, carotid stenosis, anemia, PAD    Limitations ataxia, diplopia, folley catheter, decr hearing (particularly R side)    Patient Stated Goals incr independence, get better    Currently in Pain? No/denies               Prone, scapular exercises with shoulders in ext and abduction with min v.c. followed by wt. Bearing on elbows with chest/head/hip lift for scapular depression/stability (modified plank).   Quadruped:  alternating UE lift, forward/back wt. Shifts with min cueing/close supervision.  Tall kneeling, with closed-chain shoulder flex with BUEs and trunk rotations with shoulders at 90* with close supervision/min A.  Then rolling large ball forward/back on mat with close supervision/min A for incr core stability.      Standing, functional reaching with RUE to place/remove clothespins with 1-8lb resistance on vertical pole for incr balance and RUE control with min difficulty, close supervision and 1 UE support for balance.    Sitting, tossing/catching ball with each UE (mod difficulty RUE), and then between hands with min-mod difficulty.   Rotating ball in fingertips with RUE with min cueing/difficulty.  OT Short Term Goals - 11/01/21 1243       OT SHORT TERM GOAL #1   Title Pt will be independent with initial HEP.--check STGs 11/02/21    Time 4    Period Weeks    Status Achieved      OT SHORT TERM GOAL #2   Title Pt will perform BADLs mod I except supervision for shower transfer.    Time 4    Period Weeks    Status On-going   min A for back, supervision for standing to pull up pants     OT SHORT TERM GOAL #3   Title Pt will perform tabletop visual scanning with 90% accuracy and no reports of diplopia.    Time 4    Period Weeks     Status Achieved      OT SHORT TERM GOAL #4   Title Pt will verbalize understanding of visual compensation strategies for ADLs/IADLs prn (including ?taping, head turns/positioning)    Time 4    Period Weeks    Status Achieved   Pt/ wife verbalize understanding     OT SHORT TERM GOAL #5   Title Pt will improve coordination for ADLs as shown by improving time on 9-hole peg test by at least 5sec bilaterally.    Baseline R-66.85sec, L-41.25sec    Time 4    Period Weeks    Status On-going   R 65.25-not met yet improvedd, L 33.60- met partially met, met for LUE              OT Long Term Goals - 11/05/21 1000       OT LONG TERM GOAL #1   Title Pt will be independent with updated HEP.    Time 12    Period Weeks    Status New      OT LONG TERM GOAL #2   Title Pt will perform simple snack prep/home maintenance task with supervision.    Time 12    Period Weeks    Status New      OT LONG TERM GOAL #3   Title Pt will perform environmental scanning/navigation with at least 90% accuracy for incr safety in the home/community.    Time 12    Period Weeks    Status New      OT LONG TERM GOAL #4   Title Pt will improve RUE coordination as shown by improving time on 9-hole peg test by at least 15sec.    Baseline 66.85sec    Time 12    Period Weeks    Status New      OT LONG TERM GOAL #5   Title Pt will be able to retrieve/replace 1-2 objects on overhead shelf with each UE x10 safetly and with good control for IADL tasks.    Time 12    Period Weeks    Status New      OT LONG TERM GOAL #6   Title Pt will be able to type with at least 80% accuracy with at least 20wpm in prep for work tasks.    Baseline 39% with 10wpm    Time 12    Period Weeks    Status New      OT LONG TERM GOAL #7   Title Pt will be able to write at least 4 sentences with 100% legibility in reasonable amount of time.    Time 12    Period Weeks    Status New  Plan - 11/08/21  1019     Clinical Impression Statement Pt is progressing towards goals with improving UE control, balance, and strength.    OT Occupational Profile and History Detailed Assessment- Review of Records and additional review of physical, cognitive, psychosocial history related to current functional performance    Occupational performance deficits (Please refer to evaluation for details): ADL's;IADL's;Work;Leisure    Body Structure / Function / Physical Skills ADL;UE functional use;Balance;FMC;Coordination;Sensation;IADL;Decreased knowledge of use of DME;Dexterity;Vision    Rehab Potential Good    Clinical Decision Making Several treatment options, min-mod task modification necessary    Comorbidities Affecting Occupational Performance: May have comorbidities impacting occupational performance    Modification or Assistance to Complete Evaluation  Min-Moderate modification of tasks or assist with assess necessary to complete eval    OT Frequency 2x / week    OT Duration 12 weeks   +eval   OT Treatment/Interventions Self-care/ADL training;Energy conservation;Ultrasound;Visual/perceptual remediation/compensation;Patient/family education;DME and/or AE instruction;Aquatic Therapy;Paraffin;Passive range of motion;Balance training;Cryotherapy;Functional Mobility Training;Moist Heat;Therapeutic exercise;Manual Therapy;Therapeutic activities;Neuromuscular education    Plan neuro re-ed , coordination    OT Home Exercise Plan Pt reports that he has some coordination activities and visual HEP from hospital    Consulted and Agree with Plan of Care Patient;Family member/caregiver    Family Member Consulted wife             Patient will benefit from skilled therapeutic intervention in order to improve the following deficits and impairments:   Body Structure / Function / Physical Skills: ADL, UE functional use, Balance, FMC, Coordination, Sensation, IADL, Decreased knowledge of use of DME, Dexterity, Vision        Visit Diagnosis: Other lack of coordination  Other symptoms and signs involving the nervous system  Visuospatial deficit  Other disturbances of skin sensation  Ataxia  Unsteadiness on feet  Other abnormalities of gait and mobility    Problem List Patient Active Problem List   Diagnosis Date Noted   Familial hypercholesteremia 11/06/2021   PFO (patent foramen ovale) 11/06/2021   Urinary retention 09/27/2021   Diplopia 09/27/2021   Carotid stenosis, right 09/27/2021   Anemia 09/27/2021   Slow transit constipation    Benign essential HTN    Cerebellar stroke ([place]) 09/04/2021   Acute ischemic stroke [facility])    Cerebral embolism with cerebral infarction 08/30/2021   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, OT 11/08/2021, 12:22 PM  [facility] [address] [place], [place], [address] Phone: [phone]   Fax:  [phone]  Name: Nayshawn Mesta MRN: [id number] Date of Birth: 1956/08/04  Vianne Bulls, OTR/L [facility] [address] [place], [place]  [address] [phone] phone [phone] 11/08/21 12:22 PM

## 2021-11-09 DIAGNOSIS — I639 Cerebral infarction, unspecified: Secondary | ICD-10-CM

## 2021-11-09 DIAGNOSIS — R42 Dizziness and giddiness: Secondary | ICD-10-CM | POA: Diagnosis not present

## 2021-11-09 DIAGNOSIS — I4891 Unspecified atrial fibrillation: Secondary | ICD-10-CM

## 2021-11-09 NOTE — Therapy (Signed)
[facility] [address] [place], [place], [address] Phone: [phone]   Fax:  [phone]  Physical Therapy Treatment  Patient Details  Name: Maurice Little MRN: [id number] Date of Birth: [birth year] Referring Provider (PT): Mariam Dollar, PA-C   Encounter Date: 11/08/2021   PT End of Session - 11/09/21 1107     Visit Number 6    Number of Visits 25    Date for PT Re-Evaluation 01/11/22    Authorization Type UHC ([place] with no auth required)    PT Start Time 1105    PT Stop Time 1150    PT Time Calculation (min) 45 min    Equipment Utilized During Treatment Gait belt    Activity Tolerance Patient tolerated treatment well   limited by pain from bladder spasms   Behavior During Therapy [facility] for tasks assessed/performed             Past Medical History:  Diagnosis Date   Benign prostatic hyperplasia with elevated prostate specific antigen (PSA)    Hypertension    Iliac artery stenosis, left ([facility])    with claudication resolved with stent   PAD (peripheral artery disease) ([facility])     Past Surgical History:  Procedure Laterality Date   BUBBLE STUDY  08/31/2021   Procedure: BUBBLE STUDY;  Surgeon: Chilton Si, MD;  Location[facility];  Service: Cardiovascular;;   ILIAC ARTERY STENT Left    IR ANGIO VERTEBRAL SEL SUBCLAVIAN INNOMINATE UNI L MOD SED  08/30/2021   IR ANGIO VERTEBRAL SEL SUBCLAVIAN INNOMINATE UNI R MOD SED  08/30/2021   IR CT HEAD LTD  08/30/2021   IR INTRA CRAN STENT  08/30/2021   IR US GUIDE VASC ACCESS RIGHT  08/30/2021   RADIOLOGY WITH ANESTHESIA N/A 08/30/2021   Procedure: IR WITH ANESTHESIA;  Surgeon: Julieanne Cotton, MD;  [facility];  Service: Radiology;  Laterality: N/A;   TEE WITHOUT CARDIOVERSION N/A 08/31/2021   Procedure: TRANSESOPHAGEAL ECHOCARDIOGRAM (TEE);  Surgeon: Chilton Si, MD;  Location: [facility] ENDOSCOPY;  Service: Cardiovascular;  Laterality: N/A;    There  were no vitals filed for this visit.   Subjective Assessment - 11/09/21 1049     Subjective Pt reports the dizziness is better today than it was on Monday this week - states it is back to baseline (rates it 3-4/10); had appt with cardiologist on Tuesday - they have ordered a heart monitor to be worn for 2 weeks - should be delivered by Saturday    Patient is accompained by: Family member   Maurice Little (wife)   Pertinent History Basilar artery stenosis, urinary retention with indwelling catheter, neurogenic bladder, diplopia, Rt cerebellar infarct, HTN, Rt carotid stenosis, anemia, s/p cerebral angiogram with stent    Limitations Lifting;Standing;Walking;House hold activities;Reading    Patient Stated Goals "I want to be normal again - walk without walker", decrease numbness on Rt side of body    Currently in Pain? No/denies    Pain Onset 1 to 4 weeks ago                               [facility] Adult PT Treatment/Exercise - 11/09/21 0001       Transfers   Transfers Sit to Stand;Stand to Sit    Sit to Stand 4: Min guard   no UE support used - from mat   Stand to Sit 4: Min Research scientist (medical)  Transfers 5: Supervision   from wheelchair to mat   Number of Reps Other reps (comment)   5 reps - from hi/low mat table without UE support   Comments no UE support used from mat - occasional mod to min assist given for initial stabilization upon standing      Ambulation/Gait   Ambulation/Gait Yes    Ambulation/Gait Assistance 4: Min assist    Ambulation Distance (Feet) 115 Feet   2 reps - 1st rep without weights in rollator; 2nd rep with 10# in rollator for more stability   Assistive device Rollator   trialed per wife's request   Gait Pattern Step-through pattern;Ataxic;Decreased weight shift to right;Decreased weight shift to left;Wide base of support    Ambulation Surface Level;Indoor    Gait Comments pt also gait trained with RW 115' with CGA on flat, even surface      Neuro Re-ed     Neuro Re-ed Details  Pt performed standing activity for improved balance and core stabilization - performed Zoom ball activity initially in seated position 3 reps - no difficulty; then progressed to sitting on Sit FIt - performed Zoom ball 5 reps with no significant difficulty; progressed to standing - performed approx. 20 reps with min to [place] assist for balance; PT technician, Maurice Little, assisted with this activity                 Balance Exercises - 11/09/21 0001       Balance Exercises: Standing   Stepping Strategy Anterior;Posterior;Lateral;5 reps   5 reps each foot each direction - performed by mat with RW placed in front - min assist with balance   Marching Solid surface;10 reps;Static    Other Standing Exercises Pt performed standing alternating tap ups to 6" step by side of mat - min to mod assist with standing balance needed    Other Standing Exercises Comments Pt performed cone taps (RW place in front of pt for safety) - performed 3 taps with each foot to each cone (2 cones used):                PT Education - 11/09/21 1104     Education Details Rollator was trialed per wife's request; she stated that they had one at home and wanted to know when he could start using it.  I explained to pt and wife that this device is too unsafe for pt to use at this time due to pt's lack of control and balance - rollator has too much maneuverability and pt is unable to safely use with control; both pt and wife verbalize understanding & agreement    Person(s) Educated Patient;Spouse    Methods Explanation;Demonstration    Comprehension Verbalized understanding              PT Short Term Goals - 11/09/21 1107       PT SHORT TERM GOAL #1   Title Pt will perform basic transfers wheelchair to/from mat using squat pivot transfer with supervision.    Baseline CGA    Time 4    Period Weeks    Status New    Target Date 11/16/21      PT SHORT TERM GOAL #2   Title Pt will improve  Berg score from 27/56 to >/= 32/56 to demo improved balance with reduced fall risk.    Baseline 27/56    Time 4    Period Weeks    Status New    Target Date 11/16/21  PT SHORT TERM GOAL #3   Title Improve TUG score from 1".07 secs to </= 50 secs with RW with CGA.    Baseline 1" 7 secs with RW    Time 4    Period Weeks    Status New    Target Date 11/16/21      PT SHORT TERM GOAL #4   Title Perform sit to stand from mat to RW with SBA with normal BOS.    Time 4    Period Weeks    Status New    Target Date 11/16/21      PT SHORT TERM GOAL #5   Title Amb. 51' with RW with CGA on flat, even surface.    Time 4    Period Weeks    Status New    Target Date 11/16/21      PT SHORT TERM GOAL #6   Title Perform HEP for standing balance with wife's assistance.    Time 4    Period Weeks    Status New    Target Date 11/16/21               PT Long Term Goals - 11/09/21 1108       PT LONG TERM GOAL #1   Title Pt will be modified independent with household amb. without use of assistive device.    Time 12    Period Weeks    Status New    Target Date 01/11/22      PT LONG TERM GOAL #2   Title Pt will amb. 1000' with SPC with SBA on flat even and uneven surfaces for increased community accessibility.    Time 12    Period Weeks    Status New    Target Date 01/11/22      PT LONG TERM GOAL #3   Title Improve TUG score to </= 25 secs with appropriate assistive device to demo improved functional mobility and to reduce fall risk.    Baseline 1".07 secs with RW - 10-16-21    Time 12    Period Weeks    Status New    Target Date 01/11/22      PT LONG TERM GOAL #4   Title Increase Berg score to >/= 45/56 to reduce fall risk.    Baseline 27/56    Time 12    Period Weeks    Status New    Target Date 01/11/22      PT LONG TERM GOAL #5   Title Increase gait velocity from .43 ft/sec to >/= 1.6 ft/sec with RW for increased gait efficiency.    Time 12    Period Weeks     Status New    Target Date 01/11/22      PT LONG TERM GOAL #6   Title Independent in updated HEP as appropriate.    Time 12    Period Weeks    Status New    Target Date 01/11/22                   Plan - 11/09/21 1108     Clinical Impression Statement Session focused on trial of rollator per wife's request; pt does not have sufficient balance and control to be able to use this device safely at this time; Rollator was weighted down with 10# weight and was still not stable enough for pt to use safely.  Pt had excessive weight bearing through his UE's when this device was used.  Remainder of session focused on standing balance and coordination activity with pt having mod difficulty in performing cone taps without knocking cone over, with more difficulty performing with LLE due to decreased SLS on RLE. Cont with POC.    Personal Factors and Comorbidities Comorbidity 2;Profession    Comorbidities basilar artery stenosis, dyslipidemia,  HTN, neurogenic bladder, diplopia, anemia, Lt iliac artery stenosis, PAD    Examination-Activity Limitations Bathing;Carry;Continence;Toileting;Dressing;Lift;Stand;Stairs;Squat;Reach Overhead;Locomotion Level;Transfers    Examination-Participation Restrictions Cleaning;Community Activity;Driving;Interpersonal Relationship;Laundry;Shop;Occupation;Yard Work;Meal Prep    Stability/Clinical Decision Making Evolving/Moderate complexity    Rehab Potential Good    PT Frequency 2x / week    PT Duration 12 weeks    PT Treatment/Interventions ADLs/Self Care Home Management;Aquatic Therapy;DME Instruction;Gait training;Stair training;Therapeutic activities;Therapeutic exercise;Balance training;Neuromuscular re-education;Patient/family education;Vestibular    PT Next Visit Plan Cont with gait with RW, trunk/core stabilization exercises and standing balance exs.    PT Home Exercise Plan [id number]    Consulted and Agree with Plan of Care Patient;Family member/caregiver     Family Member Consulted wife, Maurice Little             Patient will benefit from skilled therapeutic intervention in order to improve the following deficits and impairments:  Abnormal gait, Decreased activity tolerance, Decreased balance, Decreased coordination, Dizziness, Impaired sensation, Impaired UE functional use, Impaired vision/preception  Visit Diagnosis: Other abnormalities of gait and mobility  Unsteadiness on feet  Other lack of coordination     Problem List Patient Active Problem List   Diagnosis Date Noted   Familial hypercholesteremia 11/06/2021   PFO (patent foramen ovale) 11/06/2021   Urinary retention 09/27/2021   Diplopia 09/27/2021   Carotid stenosis, right 09/27/2021   Anemia 09/27/2021   Slow transit constipation    Benign essential HTN    Cerebellar stroke ([place]) 09/04/2021   Acute ischemic stroke ([facility])    Cerebral embolism with cerebral infarction 08/30/2021   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

## 2021-11-12 ENCOUNTER — Encounter: Payer: Self-pay | Admitting: Physical Medicine & Rehabilitation

## 2021-11-12 ENCOUNTER — Encounter: Payer: 59 | Attending: Registered Nurse | Admitting: Physical Medicine & Rehabilitation

## 2021-11-12 ENCOUNTER — Other Ambulatory Visit: Payer: Self-pay

## 2021-11-12 VITALS — BP 118/74 | HR 87 | Temp 98.4°F

## 2021-11-12 DIAGNOSIS — I639 Cerebral infarction, unspecified: Secondary | ICD-10-CM | POA: Diagnosis present

## 2021-11-12 DIAGNOSIS — G510 Bell's palsy: Secondary | ICD-10-CM | POA: Diagnosis present

## 2021-11-12 DIAGNOSIS — H4920 Sixth [abducent] nerve palsy, unspecified eye: Secondary | ICD-10-CM | POA: Diagnosis present

## 2021-11-12 DIAGNOSIS — H4921 Sixth [abducent] nerve palsy, right eye: Secondary | ICD-10-CM | POA: Insufficient documentation

## 2021-11-12 NOTE — Patient Instructions (Addendum)
Dr Maple Hudson   Dr  Lajean Saver  Neuro opthomology

## 2021-11-12 NOTE — Progress Notes (Signed)
Subjective:    Patient ID: Maurice Little, male    DOB: 1956/01/22, 66 y.o.   MRN: [id number] Admit date: 09/04/2021 Discharge date: 09/27/2021 66 y.o. male with history of HTN, PAD was admitted on 08/29/2021 with onset of dizziness and headaches that had started the day before and CT head done showing subacute right cerebellar infarct.  CTA head/neck done showing atherosclerosis with question of high-grade stenosis or nonocclusive clot in basilar artery.  He was loaded with DAPT and started on IV heparin.  He developed right facial droop with right hearing loss, diplopia with nystagmus as well as slurred speech with nausea the evening past admission.  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 Estanislado Pandy.   HPI Voiding without cath after urology appt, had cath inserted for an additional month due to hematuria Had foley removed and urinating some cloudy urine with freq Has appt at [facility] for 14d after cardiology appt on 1/10 Has new PCP Dr Tennis Must [place]  Wife requiring intermittent leave to transport pt to appt  The patient needs help with car transfers.  He can climb some steps he does not drive he needs to use a walker for ambulation as well as close supervision/contact-guard assist.  He does need assist with meal prep household duties shopping bathing toileting feeding and dressing. He still complains of absence of hearing on the right ear.  He also complains of some double vision he states that he is using his left eye primarily. He lives in a 1 level home with 4 steps to enter.  Walking further , typing , 230' amb CGA Pain Inventory Average Pain 7 Pain Right Now 0 My pain is intermittent, sharp, and burning  LOCATION OF PAIN  upper extremities  BOWEL Number of stools per week: 7 Oral laxative use No  Type of laxative na Enema or suppository use No  History of colostomy No   Incontinent No   BLADDER Normal In and out cath, frequency . Able to self cath  na Bladder incontinence No  Frequent urination Yes  Leakage with coughing No  Difficulty starting stream No  Incomplete bladder emptying No    Mobility use a walker ability to climb steps?  yes do you drive?  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  no  Physicians involved in your care Any changes since last visit?  no   Family History  Problem Relation Age of Onset   Healthy Mother    Social History   Socioeconomic History   Marital status: Married    Spouse name: Not on file   Number of children: Not on file   Years of education: Not on file   Highest education level: Not on file  Occupational History   Not on file  Tobacco Use   Smoking status: Former    Types: Cigarettes   Smokeless tobacco: Never  Vaping Use   Vaping Use: Never used  Substance and Sexual Activity   Alcohol use: Not Currently   Drug use: Not on file   Sexual activity: Not on file  Other Topics Concern   Not on file  Social History  Narrative   Not on file   Social Determinants of Health   Financial Resource Strain: Not on file  Food Insecurity: Not on file  Transportation Needs: Not on file  Physical Activity: Not on file  Stress: Not on file  Social Connections: Not on file   Past Surgical History:  Procedure Laterality Date   BUBBLE STUDY  08/31/2021   Procedure: BUBBLE STUDY;  Surgeon: Chilton Si, MD;  Location: [facility] ENDOSCOPY;  Service: Cardiovascular;;   ILIAC ARTERY STENT Left    IR ANGIO VERTEBRAL SEL SUBCLAVIAN INNOMINATE UNI L MOD SED  08/30/2021   IR ANGIO VERTEBRAL SEL SUBCLAVIAN INNOMINATE UNI  R MOD SED  08/30/2021   IR CT HEAD LTD  08/30/2021   IR INTRA CRAN STENT  08/30/2021   IR US GUIDE VASC ACCESS RIGHT  08/30/2021   RADIOLOGY WITH ANESTHESIA N/A 08/30/2021   Procedure: IR WITH ANESTHESIA;  Surgeon: Julieanne Cotton, MD;  Location: [facility] OR;  Service: Radiology;  Laterality: N/A;   TEE WITHOUT CARDIOVERSION N/A 08/31/2021   Procedure: TRANSESOPHAGEAL ECHOCARDIOGRAM (TEE);  Surgeon: Chilton Si, MD;  Location: [facility] ENDOSCOPY;  Service: Cardiovascular;  Laterality: N/A;   Past Medical History:  Diagnosis Date   Benign prostatic hyperplasia with elevated prostate specific antigen (PSA)    Hypertension    Iliac artery stenosis, left (HCC)    with claudication resolved with stent   PAD (peripheral artery disease) (HCC)    BP 118/74    Pulse 87    Temp 98.4 F (36.9 C) (Oral)    SpO2 95%   Opioid Risk Score:   Fall Risk Score:  `1  Depression screen PHQ 2/9  Depression screen [facility] 2/9 11/12/2021 10/08/2021  Decreased Interest 0 0  Down, Depressed, Hopeless 0 0  PHQ - 2 Score 0 0  Altered sleeping - 0  Tired, decreased energy - 0  Change in appetite - 0  Feeling bad or failure about yourself  - 0  Trouble concentrating - 0  Moving slowly or fidgety/restless - 0  Suicidal thoughts - 0  PHQ-9 Score - 0      Review of Systems  Musculoskeletal:  Positive for gait problem.       Spasms  Neurological:  Positive for dizziness and numbness.  All other systems reviewed and are negative.     Objective:   Physical Exam Vitals and nursing note reviewed.  Constitutional:      Appearance: He is normal weight.  HENT:     Head: Normocephalic and atraumatic.  Eyes:     Extraocular Movements: Extraocular movements intact.     Conjunctiva/sclera: Conjunctivae normal.     Pupils: Pupils are equal, round, and reactive to light.  Musculoskeletal:        General: Normal range of motion.  Skin:    General: Skin is warm and dry.  Neurological:     Mental Status: He is  alert and oriented to person, place, and time.     Comments: Motor strength is 5/5 bilateral deltoid, bicep, tricep, grip, hip flexor, knee extensor, ankle dorsiflexion There is mild dysmetria on finger-nose-finger testing left greater than right side. There is diplopia with right lateral gaze.  No evidence of nystagmus.  Mild skew deviation Standing was not attempted.   Psychiatric:        Mood and Affect: Mood normal.        Behavior: Behavior normal.          Assessment & Plan:  1.  Right cerebellar infarct with dysmetria, balance disorder.  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 Referral to neuro-ophthalmology, Dr. Venda Rodes Referral to audiology already has taken place. Patient will follow-up with PCP Dr. Edythe Clarity [place] Patient to follow-up with cardiology Follow-up with interventional radiology Dr. Estanislado Pandy

## 2021-11-13 ENCOUNTER — Ambulatory Visit: Payer: 59 | Admitting: Occupational Therapy

## 2021-11-13 ENCOUNTER — Ambulatory Visit: Payer: 59 | Admitting: Physical Therapy

## 2021-11-13 ENCOUNTER — Encounter: Payer: Self-pay | Admitting: Occupational Therapy

## 2021-11-13 DIAGNOSIS — R2681 Unsteadiness on feet: Secondary | ICD-10-CM

## 2021-11-13 DIAGNOSIS — R278 Other lack of coordination: Secondary | ICD-10-CM

## 2021-11-13 DIAGNOSIS — R29818 Other symptoms and signs involving the nervous system: Secondary | ICD-10-CM

## 2021-11-13 DIAGNOSIS — R208 Other disturbances of skin sensation: Secondary | ICD-10-CM

## 2021-11-13 DIAGNOSIS — R2689 Other abnormalities of gait and mobility: Secondary | ICD-10-CM

## 2021-11-13 DIAGNOSIS — R41842 Visuospatial deficit: Secondary | ICD-10-CM

## 2021-11-13 DIAGNOSIS — R270 Ataxia, unspecified: Secondary | ICD-10-CM

## 2021-11-13 NOTE — Therapy (Signed)
[facility] [address] [place], [place], [address] Phone: [phone]   Fax:  [phone]  Physical Therapy Treatment  Patient Details  Name: Maurice Little MRN: [id number] Date of Birth: 04-17-1956 Referring Provider (PT): Lauraine Rinne, PA-C   Encounter Date: 11/13/2021   PT End of Session - 11/13/21 1104     Visit Number 7    Number of Visits 25    Date for PT Re-Evaluation 01/11/22    Authorization Type UHC ([place] with no auth required)    PT Start Time 1103    PT Stop Time 1144    PT Time Calculation (min) 41 min    Equipment Utilized During Treatment Gait belt    Activity Tolerance Patient tolerated treatment well    Behavior During Therapy WFL for tasks assessed/performed             Past Medical History:  Diagnosis Date   Benign prostatic hyperplasia with elevated prostate specific antigen (PSA)    Hypertension    Iliac artery stenosis, left ([place])    with claudication resolved with stent   PAD (peripheral artery disease) ([place])     Past Surgical History:  Procedure Laterality Date   BUBBLE STUDY  08/31/2021   Procedure: BUBBLE STUDY;  Surgeon: Skeet Latch, MD;  Location: [facility];  Service: Cardiovascular;;   ILIAC ARTERY STENT Left    IR ANGIO VERTEBRAL SEL SUBCLAVIAN INNOMINATE UNI L MOD SED  08/30/2021   IR ANGIO VERTEBRAL SEL SUBCLAVIAN INNOMINATE UNI R MOD SED  08/30/2021   IR CT HEAD LTD  08/30/2021   IR INTRA CRAN STENT  08/30/2021   IR US GUIDE VASC ACCESS RIGHT  08/30/2021   RADIOLOGY WITH ANESTHESIA N/A 08/30/2021   Procedure: IR WITH ANESTHESIA;  Surgeon: Luanne Bras, MD;  Location: [facility];  Service: Radiology;  Laterality: N/A;   TEE WITHOUT CARDIOVERSION N/A 08/31/2021   Procedure: TRANSESOPHAGEAL ECHOCARDIOGRAM (TEE);  Surgeon: Skeet Latch, MD;  Location: [facility];  Service: Cardiovascular;  Laterality: N/A;    There were no vitals filed for this visit.    Subjective Assessment - 11/13/21 1105     Subjective Sees the urologist this afternoon - has a bladder infection. Not having the best day. Wearing heart monitor and wearing it for 2 weeks. No falls.    Patient is accompained by: Family member   [name] (wife)   Pertinent History Basilar artery stenosis, urinary retention with indwelling catheter, neurogenic bladder, diplopia, Rt cerebellar infarct, HTN, Rt carotid stenosis, anemia, s/p cerebral angiogram with stent    Limitations Lifting;Standing;Walking;House hold activities;Reading    Patient Stated Goals "I want to be normal again - walk without walker", decrease numbness on Rt side of body    Currently in Pain? Yes    Pain Score 2     Pain Location Bladder    Pain Orientation Right    Pain Descriptors / Indicators Aching    Pain Type Acute pain    Pain Onset 1 to 4 weeks ago    Aggravating Factors  urinating    Pain Relieving Factors nothing                [facility] PT Assessment - 11/13/21 1109       Timed Up and Go Test   Normal TUG (seconds) 31.75    TUG Comments with RW  [facility] Adult PT Treatment/Exercise - 11/13/21 1109       Transfers   Transfers Sit to Stand;Stand to Sit    Sit to Stand 4: Min guard;5: Supervision    Sit to Stand Details (indicate cue type and reason) Supervision when pt uses BUE from mat table    Stand to Sit 4: Min guard    Number of Reps Other reps (comment)   7 reps, plus additional throughout session   Comments performed with no UE support from mat table with pt scooting out towards edge, needs min guard/min A at times in standing to help steady balance      Ambulation/Gait   Ambulation/Gait Yes    Ambulation/Gait Assistance 4: Min guard    Ambulation Distance (Feet) 230 Feet   x1, plus additional distances during session   Assistive device Rolling walker    Gait Pattern Step-through pattern;Ataxic;Decreased weight shift to right;Decreased weight shift  to left;Wide base of support    Ambulation Surface Level;Indoor    Gait Comments During gait cued for incr weight shift anteriorly onto toes (esp RLE) for push off during gait, pt with tendency to just make heel contact and no push off                 Balance Exercises - 11/13/21 0001       Balance Exercises: Standing   Standing Eyes Opened Limitations;Solid surface    Standing Eyes Opened Limitations Feet hip width x10 reps head turns, x10 reps head nods, no UE support with min guard for balance    Standing Eyes Closed Limitations    Standing Eyes Closed Limitations Wide BOS x30 seconds, hip width distance 3 x 20-25 seconds with min guard for balance with pt needing to steady himself on bars at times    SLS with Vectors Solid surface;Limitations    SLS with Vectors Limitations alternating SLS taps to 6" step beginning with BUE support > fingertip > none, x12 reps each side, cued for weight shift, and up to mod A for balance                  PT Short Term Goals - 11/13/21 1117       PT SHORT TERM GOAL #1   Title Pt will perform basic transfers wheelchair to/from mat using squat pivot transfer with supervision.    Baseline performed with supervision in previous session    Time 4    Period Weeks    Status Achieved    Target Date 11/16/21      PT SHORT TERM GOAL #2   Title Pt will improve Berg score from 27/56 to >/= 32/56 to demo improved balance with reduced fall risk.    Baseline 27/56    Time 4    Period Weeks    Status New    Target Date 11/16/21      PT SHORT TERM GOAL #3   Title Improve TUG score from 1".07 secs to </= 50 secs with RW with CGA.    Baseline 1" 7 secs with RW; 31.75 seconds on 11/13/21    Time 4    Period Weeks    Status Achieved    Target Date 11/16/21      PT SHORT TERM GOAL #4   Title Perform sit to stand from mat to RW with SBA with normal BOS.    Baseline met with using BUE support    Time 4    Period Weeks  Status Achieved     Target Date 11/16/21      PT SHORT TERM GOAL #5   Title Amb. 44' with RW with CGA on flat, even surface.    Baseline 230' with CGA with RW on 11/13/21    Time 4    Period Weeks    Status Achieved    Target Date 11/16/21      PT SHORT TERM GOAL #6   Title Perform HEP for standing balance with wife's assistance.    Time 4    Period Weeks    Status New    Target Date 11/16/21               PT Long Term Goals - 11/09/21 1108       PT LONG TERM GOAL #1   Title Pt will be modified independent with household amb. without use of assistive device.    Time 12    Period Weeks    Status New    Target Date 01/11/22      PT LONG TERM GOAL #2   Title Pt will amb. 1000' with SPC with SBA on flat even and uneven surfaces for increased community accessibility.    Time 12    Period Weeks    Status New    Target Date 01/11/22      PT LONG TERM GOAL #3   Title Improve TUG score to </= 25 secs with appropriate assistive device to demo improved functional mobility and to reduce fall risk.    Baseline 1".07 secs with RW - 10-16-21    Time 12    Period Weeks    Status New    Target Date 01/11/22      PT LONG TERM GOAL #4   Title Increase Berg score to >/= 45/56 to reduce fall risk.    Baseline 27/56    Time 12    Period Weeks    Status New    Target Date 01/11/22      PT LONG TERM GOAL #5   Title Increase gait velocity from .43 ft/sec to >/= 1.6 ft/sec with RW for increased gait efficiency.    Time 12    Period Weeks    Status New    Target Date 01/11/22      PT LONG TERM GOAL #6   Title Independent in updated HEP as appropriate.    Time 12    Period Weeks    Status New    Target Date 01/11/22                   Plan - 11/13/21 1214     Clinical Impression Statement Began to assess pt's STGs tody with pt achieving STG #1, #3, #4, and #5. Pt improved TUG to 31.75 seconds (previously 1 minute and 7 seconds) with RW. Pt able to perform sit <> stands with RW  with BUE support from mat with supervision, but when performing without UE, needs min guard/min A at times to steady in standing. Pt able to ambulate 230' with RW with CGA, does needs cues for incr weight shift onto toes with RLE for push off during gait. Will check remainder of STGs at next session.    Personal Factors and Comorbidities Comorbidity 2;Profession    Comorbidities basilar artery stenosis, dyslipidemia,  HTN, neurogenic bladder, diplopia, anemia, Lt iliac artery stenosis, PAD    Examination-Activity Limitations Bathing;Carry;Continence;Toileting;Dressing;Lift;Stand;Stairs;Squat;Reach Overhead;Locomotion Level;Transfers    Examination-Participation Restrictions Cleaning;Community Activity;Driving;Interpersonal Relationship;Laundry;Shop;Occupation;Yard Work;Meal Prep  Stability/Clinical Decision Making Evolving/Moderate complexity    Rehab Potential Good    PT Frequency 2x / week    PT Duration 12 weeks    PT Treatment/Interventions ADLs/Self Care Home Management;Aquatic Therapy;DME Instruction;Gait training;Stair training;Therapeutic activities;Therapeutic exercise;Balance training;Neuromuscular re-education;Patient/family education;Vestibular    PT Next Visit Plan check remainder of STGs. Cont with gait with RW, trunk/core stabilization exercises and standing balance exs.    PT Home Exercise Plan [id number]    Consulted and Agree with Plan of Care Patient;Family member/caregiver    Family Member Consulted wife, [name]             Patient will benefit from skilled therapeutic intervention in order to improve the following deficits and impairments:  Abnormal gait, Decreased activity tolerance, Decreased balance, Decreased coordination, Dizziness, Impaired sensation, Impaired UE functional use, Impaired vision/preception  Visit Diagnosis: Other lack of coordination  Other symptoms and signs involving the nervous system  Unsteadiness on feet  Other abnormalities of gait and  mobility     Problem List Patient Active Problem List   Diagnosis Date Noted   Familial hypercholesteremia 11/06/2021   PFO (patent foramen ovale) 11/06/2021   Urinary retention 09/27/2021   Diplopia 09/27/2021   Carotid stenosis, right 09/27/2021   Anemia 09/27/2021   Slow transit constipation    Benign essential HTN    Cerebellar stroke ([place]) 09/04/2021   Acute ischemic stroke (HCC)    Cerebral embolism with cerebral infarction 08/30/2021   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 [place], [place], [address] Phone: [phone]   Fax:  [phone]  Name: Maurice Little MRN: [id number] Date of Birth: 04-10-1956

## 2021-11-13 NOTE — Therapy (Signed)
[facility] [address] [place], [place], [address] Phone: [phone]   Fax:  [phone]  Occupational Therapy Treatment  Patient Details  Name: Maurice Little MRN: [id number] Date of Birth: 1956-07-20 Referring Provider (OT): Lauraine Rinne, PA-C   Encounter Date: 11/13/2021   OT End of Session - 11/13/21 1025     Visit Number 11    Number of Visits 25    Date for OT Re-Evaluation 12/31/21    Authorization Type UHC--no visit limit/no auth, copay per day    OT Start Time 1022    OT Stop Time 1100    OT Time Calculation (min) 38 min    Activity Tolerance Patient tolerated treatment well    Behavior During Therapy [facility] for tasks assessed/performed             Past Medical History:  Diagnosis Date   Benign prostatic hyperplasia with elevated prostate specific antigen (PSA)    Hypertension    Iliac artery stenosis, left (HCC)    with claudication resolved with stent   PAD (peripheral artery disease) ([place])     Past Surgical History:  Procedure Laterality Date   BUBBLE STUDY  08/31/2021   Procedure: BUBBLE STUDY;  Surgeon: Skeet Latch, MD;  Location: [place];  Service: Cardiovascular;;   ILIAC ARTERY STENT Left    IR ANGIO VERTEBRAL SEL SUBCLAVIAN INNOMINATE UNI L MOD SED  08/30/2021   IR ANGIO VERTEBRAL SEL SUBCLAVIAN INNOMINATE UNI R MOD SED  08/30/2021   IR CT HEAD LTD  08/30/2021   IR INTRA CRAN STENT  08/30/2021   IR US GUIDE VASC ACCESS RIGHT  08/30/2021   RADIOLOGY WITH ANESTHESIA N/A 08/30/2021   Procedure: IR WITH ANESTHESIA;  Surgeon: Luanne Bras, MD;  Location: [place];  Service: Radiology;  Laterality: N/A;   TEE WITHOUT CARDIOVERSION N/A 08/31/2021   Procedure: TRANSESOPHAGEAL ECHOCARDIOGRAM (TEE);  Surgeon: Skeet Latch, MD;  Location: [place];  Service: Cardiovascular;  Laterality: N/A;    There were no vitals filed for this visit.   Subjective Assessment - 11/13/21  1024     Subjective  Pt reports bladder infection, seeing urologist today.    Pertinent History CVA (R cereballar and R small R brachium potis and R pontomedullary junction).   PMH:  dyslipidemia, HTN, carotid stenosis, anemia, PAD    Limitations ataxia, diplopia, folley catheter, decr hearing (particularly R side)    Patient Stated Goals incr independence, get better    Currently in Pain? Yes    Pain Score 2     Pain Location Bladder    Pain Descriptors / Indicators Aching    Pain Type Acute pain    Pain Onset More than a month ago    Pain Frequency Intermittent    Aggravating Factors  urinating    Pain Relieving Factors nothing              Practiced writing.  Pt able to write name legibly.  Pt also copied sentences with approx 85% legibility and incr time.    Pt reports that he is going to restroom alone some now, but that he is not performing any snack prep.  Pt demo decr attention/alternating attention today, likely due to pain/discomfort (also interrupted sleep).  Standing with intermittent tabletop support (1 UE) to reach outside base of support to each side with each UE as well as midrange functional reach for incr coordination, reach, and balance, CGA for balance.    Pt was  referred to neuro ophthalmology and for hearing screening, but is not yet scheduled.  Discussed taping as visual compensation, but as pt is not having diplopia in primary gaze (only to R side), it is not recommended at this time.  Pt to continue with eye exercises.  Pt instructed that he may need to take visual breaks while watching tv due to dry eye and difficulty closing R eye completely and decr blink rate.          OT Short Term Goals - 11/01/21 1243       OT SHORT TERM GOAL #1   Title Pt will be independent with initial HEP.--check STGs 11/02/21    Time 4    Period Weeks    Status Achieved      OT SHORT TERM GOAL #2   Title Pt will perform BADLs mod I except supervision for shower  transfer.    Time 4    Period Weeks    Status On-going   min A for back, supervision for standing to pull up pants     OT SHORT TERM GOAL #3   Title Pt will perform tabletop visual scanning with 90% accuracy and no reports of diplopia.    Time 4    Period Weeks    Status Achieved      OT SHORT TERM GOAL #4   Title Pt will verbalize understanding of visual compensation strategies for ADLs/IADLs prn (including ?taping, head turns/positioning)    Time 4    Period Weeks    Status Achieved   Pt/ wife verbalize understanding     OT SHORT TERM GOAL #5   Title Pt will improve coordination for ADLs as shown by improving time on 9-hole peg test by at least 5sec bilaterally.    Baseline R-66.85sec, L-41.25sec    Time 4    Period Weeks    Status On-going   R 65.25-not met yet improvedd, L 33.60- met partially met, met for LUE              OT Long Term Goals - 11/05/21 1000       OT LONG TERM GOAL #1   Title Pt will be independent with updated HEP.    Time 12    Period Weeks    Status New      OT LONG TERM GOAL #2   Title Pt will perform simple snack prep/home maintenance task with supervision.    Time 12    Period Weeks    Status New      OT LONG TERM GOAL #3   Title Pt will perform environmental scanning/navigation with at least 90% accuracy for incr safety in the home/community.    Time 12    Period Weeks    Status New      OT LONG TERM GOAL #4   Title Pt will improve RUE coordination as shown by improving time on 9-hole peg test by at least 15sec.    Baseline 66.85sec    Time 12    Period Weeks    Status New      OT LONG TERM GOAL #5   Title Pt will be able to retrieve/replace 1-2 objects on overhead shelf with each UE x10 safetly and with good control for IADL tasks.    Time 12    Period Weeks    Status New      OT LONG TERM GOAL #6   Title Pt will be able to type with  at least 80% accuracy with at least 20wpm in prep for work tasks.    Baseline 39% with  10wpm    Time 12    Period Weeks    Status New      OT LONG TERM GOAL #7   Title Pt will be able to write at least 4 sentences with 100% legibility in reasonable amount of time.    Time 12    Period Weeks    Status New                   Plan - 11/13/21 1025     Clinical Impression Statement Pt is progressing towards goals with improving UE control, balance, and strength.  Pt demo improved writing today.  However, pt not feeling as well today due to suspected bladder infection--see urologist today.    OT Occupational Profile and History Detailed Assessment- Review of Records and additional review of physical, cognitive, psychosocial history related to current functional performance    Occupational performance deficits (Please refer to evaluation for details): ADL's;IADL's;Work;Leisure    Body Structure / Function / Physical Skills ADL;UE functional use;Balance;FMC;Coordination;Sensation;IADL;Decreased knowledge of use of DME;Dexterity;Vision    Rehab Potential Good    Clinical Decision Making Several treatment options, min-mod task modification necessary    Comorbidities Affecting Occupational Performance: May have comorbidities impacting occupational performance    Modification or Assistance to Complete Evaluation  Min-Moderate modification of tasks or assist with assess necessary to complete eval    OT Frequency 2x / week    OT Duration 12 weeks   +eval   OT Treatment/Interventions Self-care/ADL training;Energy conservation;Ultrasound;Visual/perceptual remediation/compensation;Patient/family education;DME and/or AE instruction;Aquatic Therapy;Paraffin;Passive range of motion;Balance training;Cryotherapy;Functional Mobility Training;Moist Heat;Therapeutic exercise;Manual Therapy;Therapeutic activities;Neuromuscular education    Plan neuro re-ed , coordination    OT Home Exercise Plan Pt reports that he has some coordination activities and visual HEP from hospital    Consulted and  Agree with Plan of Care Patient;Family member/caregiver    Family Member Consulted wife             Patient will benefit from skilled therapeutic intervention in order to improve the following deficits and impairments:   Body Structure / Function / Physical Skills: ADL, UE functional use, Balance, FMC, Coordination, Sensation, IADL, Decreased knowledge of use of DME, Dexterity, Vision       Visit Diagnosis: Other lack of coordination  Other symptoms and signs involving the nervous system  Visuospatial deficit  Other disturbances of skin sensation  Ataxia  Unsteadiness on feet    Problem List Patient Active Problem List   Diagnosis Date Noted   Familial hypercholesteremia 11/06/2021   PFO (patent foramen ovale) 11/06/2021   Urinary retention 09/27/2021   Diplopia 09/27/2021   Carotid stenosis, right 09/27/2021   Anemia 09/27/2021   Slow transit constipation    Benign essential HTN    Cerebellar stroke ([place]) 09/04/2021   Acute ischemic stroke [facility])    Cerebral embolism with cerebral infarction 08/30/2021   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 [place], [place], [address] Phone: [phone]   Fax:  [phone]  Name: Vedder Brittian MRN: [id number] Date of Birth: 1956-10-11  Vianne Bulls, OTR/L [facility] [address] [place], [place]  [address] [phone] phone [phone] 11/13/21 11:59 AM

## 2021-11-14 ENCOUNTER — Other Ambulatory Visit: Payer: Self-pay | Admitting: Physical Medicine & Rehabilitation

## 2021-11-15 ENCOUNTER — Ambulatory Visit: Payer: 59 | Admitting: Occupational Therapy

## 2021-11-15 ENCOUNTER — Encounter: Payer: Self-pay | Admitting: Occupational Therapy

## 2021-11-15 ENCOUNTER — Other Ambulatory Visit: Payer: Self-pay

## 2021-11-15 ENCOUNTER — Encounter: Payer: Self-pay | Admitting: Adult Health

## 2021-11-15 ENCOUNTER — Encounter: Payer: Self-pay | Admitting: Physical Therapy

## 2021-11-15 ENCOUNTER — Ambulatory Visit: Payer: 59 | Admitting: Adult Health

## 2021-11-15 ENCOUNTER — Ambulatory Visit: Payer: 59 | Admitting: Physical Therapy

## 2021-11-15 VITALS — BP 121/79 | HR 85 | Ht 72.0 in | Wt 205.0 lb

## 2021-11-15 DIAGNOSIS — R2689 Other abnormalities of gait and mobility: Secondary | ICD-10-CM

## 2021-11-15 DIAGNOSIS — R270 Ataxia, unspecified: Secondary | ICD-10-CM

## 2021-11-15 DIAGNOSIS — R278 Other lack of coordination: Secondary | ICD-10-CM

## 2021-11-15 DIAGNOSIS — R208 Other disturbances of skin sensation: Secondary | ICD-10-CM

## 2021-11-15 DIAGNOSIS — R2681 Unsteadiness on feet: Secondary | ICD-10-CM

## 2021-11-15 DIAGNOSIS — Z8673 Personal history of transient ischemic attack (TIA), and cerebral infarction without residual deficits: Secondary | ICD-10-CM | POA: Diagnosis not present

## 2021-11-15 DIAGNOSIS — I639 Cerebral infarction, unspecified: Secondary | ICD-10-CM

## 2021-11-15 DIAGNOSIS — R29818 Other symptoms and signs involving the nervous system: Secondary | ICD-10-CM

## 2021-11-15 DIAGNOSIS — Z09 Encounter for follow-up examination after completed treatment for conditions other than malignant neoplasm: Secondary | ICD-10-CM | POA: Diagnosis not present

## 2021-11-15 DIAGNOSIS — R41842 Visuospatial deficit: Secondary | ICD-10-CM

## 2021-11-15 NOTE — Therapy (Signed)
[facility] [address] [place], [place], [address] Phone: [phone]   Fax:  [phone]  Occupational Therapy Treatment  Patient Details  Name: Maurice Little MRN: [id number] Date of Birth: Feb 01, 1956 Referring Provider (OT): Lauraine Rinne, PA-C   Encounter Date: 11/15/2021   OT End of Session - 11/15/21 0848     Visit Number 12    Number of Visits 25    Date for OT Re-Evaluation 12/31/21    Authorization Type UHC--no visit limit/no auth, copay per day    OT Start Time 0848    OT Stop Time 0930    OT Time Calculation (min) 42 min    Activity Tolerance Patient tolerated treatment well    Behavior During Therapy [facility] for tasks assessed/performed             Past Medical History:  Diagnosis Date   Benign prostatic hyperplasia with elevated prostate specific antigen (PSA)    Hypertension    Iliac artery stenosis, left (HCC)    with claudication resolved with stent   PAD (peripheral artery disease) ([place])    Stroke [facility])     Past Surgical History:  Procedure Laterality Date   BUBBLE STUDY  08/31/2021   Procedure: BUBBLE STUDY;  Surgeon: Skeet Latch, MD;  Location: [place];  Service: Cardiovascular;;   ILIAC ARTERY STENT Left    IR ANGIO VERTEBRAL SEL SUBCLAVIAN INNOMINATE UNI L MOD SED  08/30/2021   IR ANGIO VERTEBRAL SEL SUBCLAVIAN INNOMINATE UNI R MOD SED  08/30/2021   IR CT HEAD LTD  08/30/2021   IR INTRA CRAN STENT  08/30/2021   IR US GUIDE VASC ACCESS RIGHT  08/30/2021   RADIOLOGY WITH ANESTHESIA N/A 08/30/2021   Procedure: IR WITH ANESTHESIA;  Surgeon: Luanne Bras, MD;  Location: [place];  Service: Radiology;  Laterality: N/A;   TEE WITHOUT CARDIOVERSION N/A 08/31/2021   Procedure: TRANSESOPHAGEAL ECHOCARDIOGRAM (TEE);  Surgeon: Skeet Latch, MD;  Location: [place];  Service: Cardiovascular;  Laterality: N/A;    There were no vitals filed for this visit.   Subjective  Assessment - 11/15/21 0848     Subjective  on antibiotics now, denies pain    Pertinent History CVA (R cereballar and R small R brachium potis and R pontomedullary junction).   PMH:  dyslipidemia, HTN, carotid stenosis, anemia, PAD    Limitations ataxia, diplopia, folley catheter, decr hearing (particularly R side)    Patient Stated Goals incr independence, get better    Currently in Pain? No/denies    Pain Onset More than a month ago               Sigging, Closed-chain shoulder flex and diagonals to each side, chest press with ball with BUEs with min cueing for positioning (with 1lb wt on each wrist).    Prone, scapular exercises with shoulders in ext and abduction with min v.c. followed by wt. Bearing on elbows with chest/head/hip lift for scapular depression/stability (modified plank).   Ambulating with environmental scanning in min distracting environment with CGA for ambulation/balance and located 12/15 items on first pass, found additional 2 on 2nd pass.   Sitting, tossing/catching ball with RUE  with min-mod difficulty RUE.  Noted decr R eye movement during task.     OT Education - 11/15/21 0903     Education Details Yellow theraband HEP for scapular stability--see pt instructions    Person(s) Educated Patient    Methods Explanation;Demonstration;Verbal cues;Handout    Comprehension  Verbalized understanding;Returned demonstration;Verbal cues required              OT Short Term Goals - 11/01/21 1243       OT SHORT TERM GOAL #1   Title Pt will be independent with initial HEP.--check STGs 11/02/21    Time 4    Period Weeks    Status Achieved      OT SHORT TERM GOAL #2   Title Pt will perform BADLs mod I except supervision for shower transfer.    Time 4    Period Weeks    Status On-going   min A for back, supervision for standing to pull up pants     OT SHORT TERM GOAL #3   Title Pt will perform tabletop visual scanning with 90% accuracy and no reports of  diplopia.    Time 4    Period Weeks    Status Achieved      OT SHORT TERM GOAL #4   Title Pt will verbalize understanding of visual compensation strategies for ADLs/IADLs prn (including ?taping, head turns/positioning)    Time 4    Period Weeks    Status Achieved   Pt/ wife verbalize understanding     OT SHORT TERM GOAL #5   Title Pt will improve coordination for ADLs as shown by improving time on 9-hole peg test by at least 5sec bilaterally.    Baseline R-66.85sec, L-41.25sec    Time 4    Period Weeks    Status On-going   R 65.25-not met yet improvedd, L 33.60- met partially met, met for LUE              OT Long Term Goals - 11/05/21 1000       OT LONG TERM GOAL #1   Title Pt will be independent with updated HEP.    Time 12    Period Weeks    Status New      OT LONG TERM GOAL #2   Title Pt will perform simple snack prep/home maintenance task with supervision.    Time 12    Period Weeks    Status New      OT LONG TERM GOAL #3   Title Pt will perform environmental scanning/navigation with at least 90% accuracy for incr safety in the home/community.    Time 12    Period Weeks    Status New      OT LONG TERM GOAL #4   Title Pt will improve RUE coordination as shown by improving time on 9-hole peg test by at least 15sec.    Baseline 66.85sec    Time 12    Period Weeks    Status New      OT LONG TERM GOAL #5   Title Pt will be able to retrieve/replace 1-2 objects on overhead shelf with each UE x10 safetly and with good control for IADL tasks.    Time 12    Period Weeks    Status New      OT LONG TERM GOAL #6   Title Pt will be able to type with at least 80% accuracy with at least 20wpm in prep for work tasks.    Baseline 39% with 10wpm    Time 12    Period Weeks    Status New      OT LONG TERM GOAL #7   Title Pt will be able to write at least 4 sentences with 100% legibility in reasonable amount of time.  Time 12    Period Weeks    Status New                    Plan - 11/15/21 0849     Clinical Impression Statement Pt is progressing towards goals with improving UE control and strength.    OT Occupational Profile and History Detailed Assessment- Review of Records and additional review of physical, cognitive, psychosocial history related to current functional performance    Occupational performance deficits (Please refer to evaluation for details): ADL's;IADL's;Work;Leisure    Body Structure / Function / Physical Skills ADL;UE functional use;Balance;FMC;Coordination;Sensation;IADL;Decreased knowledge of use of DME;Dexterity;Vision    Rehab Potential Good    Clinical Decision Making Several treatment options, min-mod task modification necessary    Comorbidities Affecting Occupational Performance: May have comorbidities impacting occupational performance    Modification or Assistance to Complete Evaluation  Min-Moderate modification of tasks or assist with assess necessary to complete eval    OT Frequency 2x / week    OT Duration 12 weeks   +eval   OT Treatment/Interventions Self-care/ADL training;Energy conservation;Ultrasound;Visual/perceptual remediation/compensation;Patient/family education;DME and/or AE instruction;Aquatic Therapy;Paraffin;Passive range of motion;Balance training;Cryotherapy;Functional Mobility Training;Moist Heat;Therapeutic exercise;Manual Therapy;Therapeutic activities;Neuromuscular education    Plan neuro re-ed , coordination, functional mobility    OT Home Exercise Plan Pt reports that he has some coordination activities and visual HEP from hospital    Consulted and Agree with Plan of Care Patient;Family member/caregiver    Family Member Consulted wife             Patient will benefit from skilled therapeutic intervention in order to improve the following deficits and impairments:   Body Structure / Function / Physical Skills: ADL, UE functional use, Balance, FMC, Coordination, Sensation, IADL,  Decreased knowledge of use of DME, Dexterity, Vision       Visit Diagnosis: Other lack of coordination  Other symptoms and signs involving the nervous system  Visuospatial deficit  Other disturbances of skin sensation  Ataxia  Unsteadiness on feet    Problem List Patient Active Problem List   Diagnosis Date Noted   Familial hypercholesteremia 11/06/2021   PFO (patent foramen ovale) 11/06/2021   Urinary retention 09/27/2021   Diplopia 09/27/2021   Carotid stenosis, right 09/27/2021   Anemia 09/27/2021   Slow transit constipation    Benign essential HTN    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 [place], [place], [address] Phone: [phone]   Fax:  [phone]  Name: Jeson Camacho MRN: [id number] Date of Birth: 10/07/1956  Vianne Bulls, OTR/L [facility] [address] [place], [place]  [address] [phone] phone [phone] 11/15/21 10:53 AM

## 2021-11-15 NOTE — Patient Instructions (Addendum)
° ° ° °  Scapular Retraction: Bilateral   Facing anchor, pull arms back, bringing shoulder blades together. Repeat 10-15 times per set. Do 1 sessions per day.   Reverse Fly / Shoulder Retraction    Extend both arms in front of body at belly button height, palms down, holding band. Move arms out to sides, squeeze shoulder blades together. Repeat 10-15 times. Do 1 sessions per day.   Shoulder Retraction / External Rotation With Band    With band held in front, keep elbows at side while rotating hands apart and pulling shoulder blades back. Hold 2 seconds. Repeat 10-15 times. Do 1 sessions per day.

## 2021-11-15 NOTE — Progress Notes (Signed)
[facility] [address] [place]. [place] [address] [phone]       HOSPITAL FOLLOW UP NOTE  [name] Date of Birth:  [birth year] Medical Record Number:  [id number]   Reason for Referral:  hospital stroke follow up    SUBJECTIVE:   CHIEF COMPLAINT:  Chief Complaint  Patient presents with   Stroke    Rm [place] wife- Maurice Little "numbness on left side,  ataxia, etc- how long will it last"      HPI:   [name] is a 66 y.o. male with no known medical history who presented on 08/29/2021 with left-sided numbness, dizziness, headache diplopia.  Developed right facial droop, slurred speech and right hearing loss overnight.  Personally reviewed hospitalization pertinent progress notes, lab work and imaging.  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  CTA head/neck showed right VA occlusion, R PICA occlusion, mid BA high-grade stenosis vs thrombosis, right ICA 65% stenosis and L VA-PICA. IR s/p stenting of R VA and mid BA, severe 90% stenosis right ICA proximal and 50% stenosis left ICA proximal.  Repeat MRI 11/5 showed increased size of right cerebellar infarct, new patchy right cerebellar infarcts in AICA territories, new infarcts in the right middle cerebral peduncle, right medulla, right and left pons and new punctate infarcts of left parietal occipital lobes. Episode of transient dizziness and blurred vision 11/8 likley orthostatic with repeat imaging showing new density in the medulla of unclear significant - possibly artifact.  EF 60 to 65%.  TEE EF 60 to 65%, no evidence of LA/LAA thrombus or mass although positive for PFO.  Recommended 30-day cardiac event monitor outpatient to rule out A. fib.  LDL 220.  A1c 5.5.  Placed on aspirin and Brilinta s/p stent with plans on follow-up outpatient and potential  right ICA stenting.  Unstable BP placed on Cleviprex drip and placed on amlodipine and hydralazine.  Placed on atorvastatin 80 mg daily.  Therapy eval's recommended CIR for ongoing therapy needs with residual right facial droop, diplopia, right lateral gaze incomplete, right hearing loss, left-sided numbness, imbalance with RLE incoordination, and urinary retention.     Today, 11/15/2021, [name] is being seen for initial hospital follow-up accompanied by his wife, Maurice Little.  Doing outpatient PT/OT for past 6 weeks - making progress  Eye patch for car rides and at night (at first difficulty closing all the way, denies this being an issue but does continue to use eye drops frequently for dry eye) Occasional diplopia especially when looking towards the right Complete Left side numbness - denies any weakness right facial numbness and hearing impairment with tinnitus Imbalance - improving.  Ambulates short distance with RW and w/c long distance. No recent falls No further urinary retention or incontinence.  Currently being treated for UTI No new stroke/TIA symptoms  Compliant on aspirin and Brilinta as well as atorvastatin and Zetia without side effects.  Blood pressure today 121/79.  As scheduled follow-up with Dr. Estanislado Pandy next week.  Currently wearing cardiac monitor.  Has initial eval scheduled with [facility] next month.  Currently awaiting to schedule ophthalmology consult.  No further concerns at this time.       PERTINENT IMAGING  Per hospitalization 08/30/2019 CT showed right cerebellar infarct CT head and neck right VA origin occlusion, reconstituted at distal V2.  Right PICA  occlusion, mid basilar artery high-grade stenosis versus thrombosis, right ICA 65% stenosis, left VA ends at PICA MRI right cerebellar infarct, and right lower pontine infarct. IR right VA origin, mid to basilar artery prominent stenosis, status post stenting in both arteries.  Severe 90% stenosis right ICA  proximal, 50% stenosis left ICA proximal. MRI repeat 11/5 demonstrates increased size of right cerebellar infarct, new patchy right cerebellar infarcts in AICA territors, new infarcts in right middle cerebellar peduncle, right medulla, right and left pons, new punctate infarcts in left parietal and occipital lobes. 2D Echo EF 60 to 65% TEE performed 11/4, LVEF 60-65%No LA/LAAthrombus or mass.+PFO with R-->L shuntingAtherosclerosis of the descending aorta.  LDL 220 HgbA1c 5.5    ROS:   14 system review of systems performed and negative with exception of those listed in HPI  PMH:  Past Medical History:  Diagnosis Date   Benign prostatic hyperplasia with elevated prostate specific antigen (PSA)    Hypertension    Iliac artery stenosis, left (HCC)    with claudication resolved with stent   PAD (peripheral artery disease) (HCC)    Stroke (HCC)     PSH:  Past Surgical History:  Procedure Laterality Date   BUBBLE STUDY  08/31/2021   Procedure: BUBBLE STUDY;  Surgeon: Skeet Latch, MD;  Location: [facility];  Service: Cardiovascular;;   ILIAC ARTERY STENT Left    IR ANGIO VERTEBRAL SEL SUBCLAVIAN INNOMINATE UNI L MOD SED  08/30/2021   IR ANGIO VERTEBRAL SEL SUBCLAVIAN INNOMINATE UNI R MOD SED  08/30/2021   IR CT HEAD LTD  08/30/2021   IR INTRA CRAN STENT  08/30/2021   IR US GUIDE VASC ACCESS RIGHT  08/30/2021   RADIOLOGY WITH ANESTHESIA N/A 08/30/2021   Procedure: IR WITH ANESTHESIA;  Surgeon: Luanne Bras, MD;  Location: [facility];  Service: Radiology;  Laterality: N/A;   TEE WITHOUT CARDIOVERSION N/A 08/31/2021   Procedure: TRANSESOPHAGEAL ECHOCARDIOGRAM (TEE);  Surgeon: Skeet Latch, MD;  Location[facility];  Service: Cardiovascular;  Laterality: N/A;    Social History:  Social History   Socioeconomic History   Marital status: Married    Spouse name: Jody   Number of children: Not on file   Years of education: Not on file   Highest education level:  Bachelor's degree (e.g., BA, AB, BS)  Occupational History   Not on file  Tobacco Use   Smoking status: Former    Types: Cigarettes   Smokeless tobacco: Never   Tobacco comments:    Quit 2000  Vaping Use   Vaping Use: Never used  Substance and Sexual Activity   Alcohol use: Not Currently   Drug use: Never   Sexual activity: Not on file  Other Topics Concern   Not on file  Social History Narrative   Lives with wife   Social Determinants of Health   Financial Resource Strain: Not on file  Food Insecurity: Not on file  Transportation Needs: Not on file  Physical Activity: Not on file  Stress: Not on file  Social Connections: Not on file  Intimate Partner Violence: Not on file    Family History:  Family History  Problem Relation Age of Onset   Healthy Mother     Medications:   Current Outpatient Medications on File Prior to Visit  Medication Sig Dispense Refill   acetaminophen (TYLENOL) 325 MG tablet Take 1-2 tablets (325-650 mg total) by mouth every 4 (four) hours as needed for mild pain.     amLODipine (  NORVASC) 5 MG tablet Take 1 tablet (5 mg total) by mouth daily after supper. 30 tablet 0   artificial tears (LACRILUBE) OINT ophthalmic ointment Place into both eyes at bedtime. 5 g 0   aspirin EC 81 MG tablet Take 1 tablet (81 mg total) by mouth daily. Swallow whole. 300 tablet 11   citalopram (CELEXA) 10 MG tablet Take 1 tablet (10 mg total) by mouth daily. 30 tablet 0   Cyanocobalamin (B-12 PO) Take 1 tablet by mouth daily.     ezetimibe (ZETIA) 10 MG tablet Take 1 tablet (10 mg total) by mouth daily. 90 tablet 3   hydrALAZINE (APRESOLINE) 10 MG tablet Take 1 tablet (10 mg total) by mouth every 8 (eight) hours. 90 tablet 0   losartan (COZAAR) 25 MG tablet Take 1 tablet (25 mg total) by mouth daily. 30 tablet 0   naphazoline-glycerin (CLEAR EYES REDNESS) 0.012-0.25 % SOLN Place 1-2 drops into the right eye 4 (four) times daily -  with meals and at bedtime. 30 mL 1    pantoprazole (PROTONIX) 40 MG tablet Take 1 tablet (40 mg total) by mouth daily. (Patient taking differently: Take 40 mg by mouth as needed.) 30 tablet 0   rosuvastatin (CRESTOR) 40 MG tablet Take 1 tablet (40 mg total) by mouth daily. 30 tablet 0   senna-docusate (SENOKOT-S) 8.6-50 MG tablet Take 2 tablets by mouth 2 (two) times daily. 120 tablet 0   sulfamethoxazole-trimethoprim (BACTRIM DS) 800-160 MG tablet Take 1 tablet by mouth 2 (two) times daily.     ticagrelor (BRILINTA) 90 MG TABS tablet Take 1 tablet (90 mg total) by mouth 2 (two) times daily. 60 tablet 1   No current facility-administered medications on file prior to visit.    Allergies:   Allergies  Allergen Reactions   Duloxetine Tinitus    Other reaction(s): Other (See Comments) Dizziness Dizziness, tinnitus       OBJECTIVE:  Physical Exam  Vitals:   11/15/21 1026  BP: 121/79  Pulse: 85  Weight: 205 lb (93 kg)  Height: 6' (1.829 m)   Body mass index is 27.8 kg/m. No results found.  Post stroke PHQ 2/9 Depression screen PHQ 2/9 11/12/2021  Decreased Interest 0  Down, Depressed, Hopeless 0  PHQ - 2 Score 0  Altered sleeping -  Tired, decreased energy -  Change in appetite -  Feeling bad or failure about yourself  -  Trouble concentrating -  Moving slowly or fidgety/restless -  Suicidal thoughts -  PHQ-9 Score -     General: well developed, well nourished, very pleasant middle-age Caucasian male, seated, in no evident distress Head: head normocephalic and atraumatic.   Neck: supple with no carotid or supraclavicular bruits Cardiovascular: regular rate and rhythm, no murmurs Musculoskeletal: no deformity Skin:  no rash/petichiae Vascular:  Normal pulses all extremities   Neurologic Exam Mental Status: Awake and fully alert. mild dysarthria but understood without difficulty.  Oriented to place and time. Recent and remote memory intact. Attention span, concentration and fund of knowledge  appropriate. Mood and affect appropriate.  Cranial Nerves: Fundoscopic exam reveals sharp disc margins. Pupils equal, briskly reactive to light. Extraocular movements full with no nystagmus noted on right lateral gaze. R INO. Incomplete closure of right eye lid.  Visual fields full to confrontation. Right sided hearing loss to finger rub, intact left ear.  Decreased facial sensation split down midline bilaterally L>R.  Mild right lower facial weakness.  Tongue, palate moves normally and symmetrically.  Motor: Normal bulk and tone. Normal strength in all tested extremity muscles Sensory.: decreased left hemibody sensory. Intact right side Coordination: Rapid alternating movements normal in all extremities. Finger-to-nose RUE ataxia and heel-to-shin performed accurately bilaterally. Gait and Station: deferred as RW not present Reflexes: 1+ and symmetric. Toes downgoing.      NIHSS  4 Modified Rankin  3-4      ASSESSMENT: Merari Maish is a 66 y.o. 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 Vascular risk factors include carotid stenosis, HTN and HLD.      PLAN:  Multiple strokes (as above):  Residual deficit: left hemisensory impairment, right facial numbness with weakness, right ear hearing loss with tinnitus, gait impairment with imbalance, visual/eye impairment, and dysarthria.  Continue working with PT/OT. Use of RW at all times unless otherwise instructed.  Discussed typical recovery time and ensure follow-up with ophthalmology and audiology.  Continue aspirin 81 mg daily  and atorvastatin 80 mg daily and Zetia for  secondary stroke prevention.   Continue follow-up with Dr. Estanislado Pandy s/p VA and BA stenting - continue Brilinta per IR recommendations Complete cardiac monitor to rule out A. fib as potential etiology Discussed secondary stroke prevention measures and importance of close PCP follow up for aggressive stroke risk factor management. I have gone over the pathophysiology of stroke, warning signs and symptoms, risk factors and their management in some detail with instructions to go to the closest emergency room for symptoms of concern. HTN: BP goal <130/90.  Stable on current regimen per PCP HLD: LDL goal <70. Recent LDL 56 down from 220 on atorvastatin 80 mg daily and Zetia managed by PCP.  Carotid stenosis: f/u visit with Dr. Estanislado Pandy next week for possible intervention of right ICA stenosis    Follow up in 3 to 4 months or call earlier if needed   CC:  [facility] provider: Dr. Leonie Man PCP: [place], Blondell Reveal, MD    I spent 66 minutes of face-to-face and non-face-to-face time with patient and wife.  This included previsit chart review including review of recent hospitalization, lab review, study review, electronic health record documentation, patient and wife education regarding recent stroke including etiology, secondary stroke prevention measures and importance of managing stroke risk factors, multiple residual deficits and typical recovery time and answered all other questions to patient satisfaction  Frann Rider, AGNP-BC  [facility] [address] [place], [place] [address]  Phone [phone] Fax [phone] Note: This document was prepared with digital dictation and possible smart phrase technology. Any transcriptional errors that result from this process are unintentional.

## 2021-11-15 NOTE — Patient Instructions (Signed)
Continue aspirin 81 mg daily  and Crestor and Zetia  for secondary stroke prevention  Ensure follow up with Dr. Corliss Skains as scheduled -   Continue to follow up with PCP regarding cholesterol and blood pressure management  Maintain strict control of hypertension with blood pressure goal below 130/90, diabetes with hemoglobin A1c goal below 7.0 % and cholesterol with LDL cholesterol (bad cholesterol) goal below 70 mg/dL.   Signs of a Stroke? Follow the BEFAST method:  Balance Watch for a sudden loss of balance, trouble with coordination or vertigo Eyes Is there a sudden loss of vision in one or both eyes? Or double vision?  Face: Ask the person to smile. Does one side of the face droop or is it numb?  Arms: Ask the person to raise both arms. Does one arm drift downward? Is there weakness or numbness of a leg? Speech: Ask the person to repeat a simple phrase. Does the speech sound slurred/strange? Is the person confused ? Time: If you observe any of these signs, call 911.    Followup in the future with me in 3 to 4 months or call earlier if needed       Thank you for coming to see [place] at [facility]. I hope we have been able to provide you high quality care today.  You may receive a patient satisfaction survey over the next few weeks. We would appreciate your feedback and comments so that we may continue to improve ourselves and the health of our patients.

## 2021-11-16 NOTE — Therapy (Signed)
[facility] [address] [place], [place], [address] Phone: [phone]   Fax:  [phone]  Physical Therapy Treatment  Patient Details  Name: Maurice Little MRN: [id number] Date of Birth: [birth year] Referring Provider (PT): Lauraine Rinne, PA-C   Encounter Date: 11/15/2021   PT End of Session - 11/16/21 1035     Visit Number 8    Number of Visits 25    Date for PT Re-Evaluation 01/11/22    Authorization Type UHC ([place] with no auth required)    PT Start Time 0930    PT Stop Time 1006   session ended early due to appt at [facility] at 10:15   PT Time Calculation (min) 36 min    Equipment Utilized During Treatment Gait belt    Activity Tolerance Patient tolerated treatment well    Behavior During Therapy WFL for tasks assessed/performed             Past Medical History:  Diagnosis Date   Benign prostatic hyperplasia with elevated prostate specific antigen (PSA)    Hypertension    Iliac artery stenosis, left ([place] [place])    with claudication resolved with stent   PAD (peripheral artery disease) ([place])    Stroke [facility])     Past Surgical History:  Procedure Laterality Date   BUBBLE STUDY  08/31/2021   Procedure: BUBBLE STUDY;  Surgeon: Skeet Latch, MD;  Location: [facility];  Service: Cardiovascular;;   ILIAC ARTERY STENT Left    IR ANGIO VERTEBRAL SEL SUBCLAVIAN INNOMINATE UNI L MOD SED  08/30/2021   IR ANGIO VERTEBRAL SEL SUBCLAVIAN INNOMINATE UNI R MOD SED  08/30/2021   IR CT HEAD LTD  08/30/2021   IR INTRA CRAN STENT  08/30/2021   IR US GUIDE VASC ACCESS RIGHT  08/30/2021   RADIOLOGY WITH ANESTHESIA N/A 08/30/2021   Procedure: IR WITH ANESTHESIA;  Surgeon: Luanne Bras, MD;  Location: [facility];  Service: Radiology;  Laterality: N/A;   TEE WITHOUT CARDIOVERSION N/A 08/31/2021   Procedure: TRANSESOPHAGEAL ECHOCARDIOGRAM (TEE);  Surgeon: Skeet Latch, MD;  Location: [facility];  Service: Cardiovascular;   Laterality: N/A;    There were no vitals filed for this visit.   Subjective Assessment - 11/15/21 0934     Subjective Pt states he saw the urologist Tuesday afternoon - prescribed antibiotics and is doing better now    Patient is accompained by: Family member   Maurice Little (wife)   Pertinent History Basilar artery stenosis, urinary retention with indwelling catheter, neurogenic bladder, diplopia, Rt cerebellar infarct, HTN, Rt carotid stenosis, anemia, s/p cerebral angiogram with stent    Limitations Lifting;Standing;Walking;House hold activities;Reading    Patient Stated Goals "I want to be normal again - walk without walker", decrease numbness on Rt side of body    Currently in Pain? No/denies    Pain Onset 1 to 4 weeks ago                [facility] PT Assessment - 11/16/21 0001       Berg Balance Test   Sit to Stand Able to stand without using hands and stabilize independently    Standing Unsupported Able to stand 2 minutes with supervision    Sitting with Back Unsupported but Feet Supported on Floor or Stool Able to sit safely and securely 2 minutes    Stand to Sit Controls descent by using hands    Transfers Able to transfer safely, definite need of hands    Standing Unsupported with  Eyes Closed Able to stand 10 seconds with supervision    Standing Unsupported with Feet Together Able to place feet together independently and stand for 1 minute with supervision   held 30 secs   From Standing, Reach Forward with Outstretched Arm Can reach forward >12 cm safely (5")    From Standing Position, Pick up Object from Floor Unable to pick up shoe, but reaches 2-5 cm (1-2") from shoe and balances independently   able to pick up object   From Standing Position, Turn to Look Behind Over each Shoulder Turn sideways only but maintains balance    Turn 360 Degrees Needs close supervision or verbal cueing    Standing Unsupported, Alternately Place Feet on Step/Stool Able to complete >2 steps/needs  minimal assist    Standing Unsupported, One Foot in [place] to take small step independently and hold 30 seconds    Standing on One Leg Tries to lift leg/unable to hold 3 seconds but remains standing independently    Total Score 35                           [facility] Adult PT Treatment/Exercise - 11/16/21 0001       Transfers   Transfers Sit to Stand;Stand to Sit    Sit to Stand 4: Min guard    Stand to Sit 4: Min guard    Number of Reps Other reps (comment)   5 reps   Comments no UE support used from mat - occasional mod to min assist given for initial stabilization upon standing                 Balance Exercises - 11/16/21 0001       Balance Exercises: Standing   Other Standing Exercises Pt performed alternating forward, back and side kicks 5 reps only due to time constraint (for HEP addition for balance exercises); wife present for instruction - pt held onto RW for UE support for assist with balance; also performed marching in place 5 reps each leg with UE support                PT Education - 11/16/21 1031     Education Details discussed progress toward STG #2 as pt's Berg balance test score has increased from 27/56 to 35/56;  pt and wife verbally instructed in standing balance exercises to add to HEP - no handout given due to time constraint    Person(s) Educated Patient;Spouse    Methods Explanation;Demonstration    Comprehension Verbalized understanding;Returned demonstration              PT Short Term Goals - 11/15/21 0936       PT SHORT TERM GOAL #1   Title Pt will perform basic transfers wheelchair to/from mat using squat pivot transfer with supervision.  Upgrade - Perform basic transfers modified independently. (Target date all STG's 12-14-21)    Baseline performed with supervision in previous session    Time 4    Period Weeks    Status Achieved    Target Date 12/14/21      PT SHORT TERM GOAL #2   Title Pt will improve Berg  score from 27/56 to >/= 32/56 to demo improved balance with reduced fall risk.  Upgraded goal - increase score to >/= 40/56    Baseline 27/56; 35/56 11-15-21    Time 4    Period Weeks    Status New  Target Date 12/14/21      PT SHORT TERM GOAL #3   Title Improve TUG score from 1".07 secs to </= 50 secs with RW with CGA.;  Upgraded goal - improve score from 31.75 secs to </= 25 secs with RW    Baseline 1" 7 secs with RW; 31.75 seconds on 11/13/21    Time 4    Period Weeks    Status New    Target Date 12/14/21      PT SHORT TERM GOAL #4   Title Perform sit to stand from mat to RW with SBA with normal BOS. Upgraded goal - perform sit to stand 5 times without UE support without LOB with SBA    Baseline met with using BUE support    Time 4    Period Weeks    Status New    Target Date 12/14/21      PT SHORT TERM GOAL #5   Title Amb. 68' with RW with CGA on flat, even surface. Upgraded goal 85' with RW with SBA on flat, even surface    Baseline 230' with CGA with RW on 11/13/21    Time 4    Period Weeks    Status New    Target Date 12/14/21      PT SHORT TERM GOAL #6   Title Perform HEP for standing balance with wife's assistance.    Baseline new balance exercises issued on 11-15-21    Time 4    Period Weeks    Status On-going    Target Date 12/14/21               PT Long Term Goals - 11/16/21 1046       PT LONG TERM GOAL #1   Title Pt will be modified independent with household amb. without use of assistive device.    Time 12    Period Weeks    Status New    Target Date 01/11/22      PT LONG TERM GOAL #2   Title Pt will amb. 1000' with SPC with SBA on flat even and uneven surfaces for increased community accessibility.    Time 12    Period Weeks    Status New    Target Date 01/11/22      PT LONG TERM GOAL #3   Title Improve TUG score to </= 25 secs with appropriate assistive device to demo improved functional mobility and to reduce fall risk.    Baseline  1".07 secs with RW - 10-16-21    Time 12    Period Weeks    Status New    Target Date 01/11/22      PT LONG TERM GOAL #4   Title Increase Berg score to >/= 45/56 to reduce fall risk.    Baseline 27/56    Time 12    Period Weeks    Status New    Target Date 01/11/22      PT LONG TERM GOAL #5   Title Increase gait velocity from .43 ft/sec to >/= 1.6 ft/sec with RW for increased gait efficiency.    Time 12    Period Weeks    Status New    Target Date 01/11/22      PT LONG TERM GOAL #6   Title Independent in updated HEP as appropriate.    Time 12    Period Weeks    Status New    Target Date 01/11/22  Plan - 11/16/21 1036     Clinical Impression Statement Session focused on STG #2 and 6 assessment.  Pt's Berg score has increased from 27/56 to 35/56; pt able to perform sit to stand transfers without UE support but requires increased time to scoot toward edge, lean forward and focus on technique for stabilizing.  Balance exercises were added to HEP but no handout given due to time constraint as pt needed to leave 10" early for appt at [facility].  Cont with POC.    Personal Factors and Comorbidities Comorbidity 2;Profession    Comorbidities basilar artery stenosis, dyslipidemia,  HTN, neurogenic bladder, diplopia, anemia, Lt iliac artery stenosis, PAD    Examination-Activity Limitations Bathing;Carry;Continence;Toileting;Dressing;Lift;Stand;Stairs;Squat;Reach Overhead;Locomotion Level;Transfers    Examination-Participation Restrictions Cleaning;Community Activity;Driving;Interpersonal Relationship;Laundry;Shop;Occupation;Yard Work;Meal Prep    Stability/Clinical Decision Making Evolving/Moderate complexity    Rehab Potential Good    PT Frequency 2x / week    PT Duration 12 weeks    PT Treatment/Interventions ADLs/Self Care Home Management;Aquatic Therapy;DME Instruction;Gait training;Stair training;Therapeutic activities;Therapeutic exercise;Balance  training;Neuromuscular re-education;Patient/family education;Vestibular    PT Next Visit Plan Give standing balance exercises (pics) for HEP; Cont with gait with RW, trunk/core stabilization exercises and standing balance exs.    PT Home Exercise Plan [id number]    Consulted and Agree with Plan of Care Patient;Family member/caregiver    Family Member Consulted wife, Maurice Little             Patient will benefit from skilled therapeutic intervention in order to improve the following deficits and impairments:  Abnormal gait, Decreased activity tolerance, Decreased balance, Decreased coordination, Dizziness, Impaired sensation, Impaired UE functional use, Impaired vision/preception  Visit Diagnosis: Other abnormalities of gait and mobility  Unsteadiness on feet     Problem List Patient Active Problem List   Diagnosis Date Noted   Familial hypercholesteremia 11/06/2021   PFO (patent foramen ovale) 11/06/2021   Urinary retention 09/27/2021   Diplopia 09/27/2021   Carotid stenosis, right 09/27/2021   Anemia 09/27/2021   Slow transit constipation    Benign essential HTN    Cerebellar stroke ([place]) 09/04/2021   Acute ischemic stroke [facility])    Cerebral embolism with cerebral infarction 08/30/2021   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 [place], [place], [address] Phone: [phone]   Fax:  [phone]  Name: Maurice Little MRN: [id number] Date of Birth: 10/20/1956

## 2021-11-18 ENCOUNTER — Other Ambulatory Visit: Payer: Self-pay | Admitting: Physical Medicine & Rehabilitation

## 2021-11-19 NOTE — Telephone Encounter (Signed)
He is finishing his Rx from hospital, I assume your office will refill since I am not a primary care physician THanks

## 2021-11-20 ENCOUNTER — Ambulatory Visit: Payer: 59 | Admitting: Physical Therapy

## 2021-11-20 ENCOUNTER — Encounter: Payer: Self-pay | Admitting: Physical Therapy

## 2021-11-20 ENCOUNTER — Other Ambulatory Visit: Payer: Self-pay

## 2021-11-20 ENCOUNTER — Encounter: Payer: Self-pay | Admitting: Occupational Therapy

## 2021-11-20 ENCOUNTER — Ambulatory Visit: Payer: 59 | Admitting: Occupational Therapy

## 2021-11-20 DIAGNOSIS — R2689 Other abnormalities of gait and mobility: Secondary | ICD-10-CM

## 2021-11-20 DIAGNOSIS — R2681 Unsteadiness on feet: Secondary | ICD-10-CM

## 2021-11-20 DIAGNOSIS — R29818 Other symptoms and signs involving the nervous system: Secondary | ICD-10-CM

## 2021-11-20 DIAGNOSIS — R270 Ataxia, unspecified: Secondary | ICD-10-CM

## 2021-11-20 DIAGNOSIS — R278 Other lack of coordination: Secondary | ICD-10-CM

## 2021-11-20 DIAGNOSIS — R41842 Visuospatial deficit: Secondary | ICD-10-CM

## 2021-11-20 DIAGNOSIS — R208 Other disturbances of skin sensation: Secondary | ICD-10-CM

## 2021-11-20 NOTE — Therapy (Signed)
[facility] [address] [place], [place], [address] Phone: [phone]   Fax:  [phone]  Occupational Therapy Treatment  Patient Details  Name: Maurice Little MRN: [id number] Date of Birth: 04-14-1956 Referring Provider (OT): Lauraine Rinne, PA-C   Encounter Date: 11/20/2021   OT End of Session - 11/20/21 1026     Visit Number 13    Number of Visits 25    Date for OT Re-Evaluation 12/31/21    Authorization Type UHC--no visit limit/no auth, copay per day    OT Start Time 7096    OT Stop Time 1101    OT Time Calculation (min) 38 min    Activity Tolerance Patient tolerated treatment well    Behavior During Therapy [facility] for tasks assessed/performed             Past Medical History:  Diagnosis Date   Benign prostatic hyperplasia with elevated prostate specific antigen (PSA)    Hypertension    Iliac artery stenosis, left ([place])    with claudication resolved with stent   PAD (peripheral artery disease) ([place])    Stroke [facility])     Past Surgical History:  Procedure Laterality Date   BUBBLE STUDY  08/31/2021   Procedure: BUBBLE STUDY;  Surgeon: Skeet Latch, MD;  Location: [facility];  Service: Cardiovascular;;   ILIAC ARTERY STENT Left    IR ANGIO VERTEBRAL SEL SUBCLAVIAN INNOMINATE UNI L MOD SED  08/30/2021   IR ANGIO VERTEBRAL SEL SUBCLAVIAN INNOMINATE UNI R MOD SED  08/30/2021   IR CT HEAD LTD  08/30/2021   IR INTRA CRAN STENT  08/30/2021   IR US GUIDE VASC ACCESS RIGHT  08/30/2021   RADIOLOGY WITH ANESTHESIA N/A 08/30/2021   Procedure: IR WITH ANESTHESIA;  Surgeon: Luanne Bras, MD;  Location: [facility];  Service: Radiology;  Laterality: N/A;   TEE WITHOUT CARDIOVERSION N/A 08/31/2021   Procedure: TRANSESOPHAGEAL ECHOCARDIOGRAM (TEE);  Surgeon: Skeet Latch, MD;  Location: [facility];  Service: Cardiovascular;  Laterality: N/A;    There were no vitals filed for this visit.   Subjective  Assessment - 11/20/21 1025     Subjective  finished with antibiotics, denies pain.  Pt reports that he put together a little lego car.  Pt repots that he was able to peel boiled eggs today.  Pt reports neuro-ophthalmologist appt tomorrow.    Pertinent History CVA (R cereballar and R small R brachium potis and R pontomedullary junction).   PMH:  dyslipidemia, HTN, carotid stenosis, anemia, PAD    Limitations ataxia, diplopia, folley catheter, decr hearing (particularly R side)    Patient Stated Goals incr independence, get better    Currently in Pain? No/denies    Pain Onset More than a month ago              Pt reports incr dizziness with head movements.  Pt reports that he has to think through tasks more now or he will miss something/forget something.    Discussed progress towards ADLs--see STG #2.  Pt reports that he folded some clothes in sitting.  Also reassessed 9-hole peg test--see below.  Completing [employer] Pegboard with R hand for incr coordination with min difficulty.  Pt instructed in benefit/use of walker tray (and where to purchase).  Practiced use of walker tray to ambulate with close supervision to perform simple environmental scanning and navigation to retrieve objects and place on walker tray.  Pt with no LOB and good safety/RW navigation, but did miss  1/10 items initially with min v.c.  Standing at table with supervision to fold towels for incr balance and IADL performance with good safety/balance and only min difficulty/incr time with coordination.         OT Short Term Goals - 11/20/21 1028       OT SHORT TERM GOAL #1   Title Pt will be independent with initial HEP.--check STGs 11/02/21    Time 4    Period Weeks    Status Achieved      OT SHORT TERM GOAL #2   Title Pt will perform BADLs mod I except supervision for shower transfer.    Time 4    Period Weeks    Status On-going   min A for back, supervision for standing to pull up pants.  11/20/21:  supervision  for ambulation (mod I for tolieting at night with [facility]), supervision for shower transfer     OT SHORT TERM GOAL #3   Title Pt will perform tabletop visual scanning with 90% accuracy and no reports of diplopia.    Time 4    Period Weeks    Status Achieved      OT SHORT TERM GOAL #4   Title Pt will verbalize understanding of visual compensation strategies for ADLs/IADLs prn (including ?taping, head turns/positioning)    Time 4    Period Weeks    Status Achieved   Pt/ wife verbalize understanding     OT SHORT TERM GOAL #5   Title Pt will improve coordination for ADLs as shown by improving time on 9-hole peg test by at least 5sec bilaterally.    Baseline R-66.85sec, L-41.25sec    Time 4    Period Weeks    Status Achieved   R 65.25-not met yet improvedd, L 33.60- met partially met, met for LUE.  11/20/21:  R-51.75sec              OT Long Term Goals - 11/05/21 1000       OT LONG TERM GOAL #1   Title Pt will be independent with updated HEP.    Time 12    Period Weeks    Status New      OT LONG TERM GOAL #2   Title Pt will perform simple snack prep/home maintenance task with supervision.    Time 12    Period Weeks    Status New      OT LONG TERM GOAL #3   Title Pt will perform environmental scanning/navigation with at least 90% accuracy for incr safety in the home/community.    Time 12    Period Weeks    Status New      OT LONG TERM GOAL #4   Title Pt will improve RUE coordination as shown by improving time on 9-hole peg test by at least 15sec.    Baseline 66.85sec    Time 12    Period Weeks    Status New      OT LONG TERM GOAL #5   Title Pt will be able to retrieve/replace 1-2 objects on overhead shelf with each UE x10 safetly and with good control for IADL tasks.    Time 12    Period Weeks    Status New      OT LONG TERM GOAL #6   Title Pt will be able to type with at least 80% accuracy with at least 20wpm in prep for work tasks.    Baseline 39% with 10wpm  Time 12    Period Weeks    Status New      OT LONG TERM GOAL #7   Title Pt will be able to write at least 4 sentences with 100% legibility in reasonable amount of time.    Time 12    Period Weeks    Status New                   Plan - 11/20/21 1026     Clinical Impression Statement Pt is progressing towards goals with improving UE control and strength, ADL/IADL performance, and balance.    OT Occupational Profile and History Detailed Assessment- Review of Records and additional review of physical, cognitive, psychosocial history related to current functional performance    Occupational performance deficits (Please refer to evaluation for details): ADL's;IADL's;Work;Leisure    Body Structure / Function / Physical Skills ADL;UE functional use;Balance;FMC;Coordination;Sensation;IADL;Decreased knowledge of use of DME;Dexterity;Vision    Rehab Potential Good    Clinical Decision Making Several treatment options, min-mod task modification necessary    Comorbidities Affecting Occupational Performance: May have comorbidities impacting occupational performance    Modification or Assistance to Complete Evaluation  Min-Moderate modification of tasks or assist with assess necessary to complete eval    OT Frequency 2x / week    OT Duration 12 weeks   +eval   OT Treatment/Interventions Self-care/ADL training;Energy conservation;Ultrasound;Visual/perceptual remediation/compensation;Patient/family education;DME and/or AE instruction;Aquatic Therapy;Paraffin;Passive range of motion;Balance training;Cryotherapy;Functional Mobility Training;Moist Heat;Therapeutic exercise;Manual Therapy;Therapeutic activities;Neuromuscular education    Plan ?snack prep, neuro re-ed, coordination, functional mobility    OT Home Exercise Plan Pt reports that he has some coordination activities and visual HEP from hospital    Consulted and Agree with Plan of Care Patient;Family member/caregiver    Family Member  Consulted wife             Patient will benefit from skilled therapeutic intervention in order to improve the following deficits and impairments:   Body Structure / Function / Physical Skills: ADL, UE functional use, Balance, FMC, Coordination, Sensation, IADL, Decreased knowledge of use of DME, Dexterity, Vision       Visit Diagnosis: Other lack of coordination  Other symptoms and signs involving the nervous system  Visuospatial deficit  Other disturbances of skin sensation  Ataxia  Unsteadiness on feet  Other abnormalities of gait and mobility    Problem List Patient Active Problem List   Diagnosis Date Noted   Familial hypercholesteremia 11/06/2021   PFO (patent foramen ovale) 11/06/2021   Urinary retention 09/27/2021   Diplopia 09/27/2021   Carotid stenosis, right 09/27/2021   Anemia 09/27/2021   Slow transit constipation    Benign essential HTN    Cerebellar stroke ([place]) 09/04/2021   Acute ischemic stroke [facility])    Cerebral embolism with cerebral infarction 08/30/2021   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, OT 11/20/2021, 4:08 PM  [facility] [address] [place], [place], [address] Phone: [phone]   Fax:  [phone]  Name: Damone Fancher MRN: [id number] Date of Birth: 1956-06-23  Vianne Bulls, OTR/L [facility] [address] [place], [place]  [address] [phone] phone [phone] 11/20/21 4:08 PM

## 2021-11-20 NOTE — Therapy (Addendum)
[facility] [address] [place], [place], [address] Phone: [phone]   Fax:  [phone]  Physical Therapy Treatment  Patient Details  Name: Maurice Little MRN: [id number] Date of Birth: 06-19-1956 Referring Provider (PT): Lauraine Rinne, PA-C   Encounter Date: 11/20/2021   PT End of Session - 11/20/21 1115     Visit Number 9    Number of Visits 25    Date for PT Re-Evaluation 01/11/22    Authorization Type UHC ([place] with no auth required)    PT Start Time 1110   PT running late   PT Stop Time 1152    PT Time Calculation (min) 42 min    Equipment Utilized During Treatment Gait belt    Activity Tolerance Patient tolerated treatment well    Behavior During Therapy WFL for tasks assessed/performed             Past Medical History:  Diagnosis Date   Benign prostatic hyperplasia with elevated prostate specific antigen (PSA)    Hypertension    Iliac artery stenosis, left ([place])    with claudication resolved with stent   PAD (peripheral artery disease) ([place])    Stroke [facility])     Past Surgical History:  Procedure Laterality Date   BUBBLE STUDY  08/31/2021   Procedure: BUBBLE STUDY;  Surgeon: Skeet Latch, MD;  Location: [place];  Service: Cardiovascular;;   ILIAC ARTERY STENT Left    IR ANGIO VERTEBRAL SEL SUBCLAVIAN INNOMINATE UNI L MOD SED  08/30/2021   IR ANGIO VERTEBRAL SEL SUBCLAVIAN INNOMINATE UNI R MOD SED  08/30/2021   IR CT HEAD LTD  08/30/2021   IR INTRA CRAN STENT  08/30/2021   IR US GUIDE VASC ACCESS RIGHT  08/30/2021   RADIOLOGY WITH ANESTHESIA N/A 08/30/2021   Procedure: IR WITH ANESTHESIA;  Surgeon: Luanne Bras, MD;  Location: [place];  Service: Radiology;  Laterality: N/A;   TEE WITHOUT CARDIOVERSION N/A 08/31/2021   Procedure: TRANSESOPHAGEAL ECHOCARDIOGRAM (TEE);  Surgeon: Skeet Latch, MD;  Location: [place];  Service: Cardiovascular;  Laterality: N/A;    There were  no vitals filed for this visit.   Subjective Assessment - 11/20/21 1113     Subjective No changes since he was last here. Feeling much better after the antibiotics. Will see the opthamologist tomorrow.    Patient is accompained by: Family member   Maurice Little (wife)   Pertinent History Basilar artery stenosis, urinary retention with indwelling catheter, neurogenic bladder, diplopia, Rt cerebellar infarct, HTN, Rt carotid stenosis, anemia, s/p cerebral angiogram with stent    Limitations Lifting;Standing;Walking;House hold activities;Reading    Patient Stated Goals "I want to be normal again - walk without walker", decrease numbness on Rt side of body    Currently in Pain? No/denies    Pain Onset 1 to 4 weeks ago                               [facility] Adult PT Treatment/Exercise - 11/20/21 1150       Transfers   Transfers Sit to Stand;Stand to Sit    Sit to Stand 4: Min guard    Stand to Sit 4: Min guard    Comments From lower mat table with BUE support, and min guard from transport chair with no UE support, needs initial min guard in standing for steadying.      Ambulation/Gait   Ambulation/Gait Yes  Ambulation/Gait Assistance 4: Min guard    Ambulation Distance (Feet) 345 Feet   2 x 75   Assistive device Rolling walker    Gait Pattern Step-through pattern;Ataxic;Decreased weight shift to right;Decreased weight shift to left;Wide base of support    Ambulation Surface Level;Indoor    Gait Comments During gait cued for incr weight shift anteriorly onto toes (esp RLE) for push off during gait. Cues at times to slow pace down when going around curves in gym, pt with tendency to perform with step to pattern.      Neuro Re-ed    Neuro Re-ed Details  Quadruped postion: x10 reps weight shifting onto hands and then back to heels,  x5 reps alternating leg lifts, x2 reps each side opposite BLE/BUE lifts - needing min/mod A for balance. Tall kneel with kaye bench anteriorly(only  needed sometimes for balance): mini squats no UE support x10 reps - cues for core/glute activation, eyes closed x5 reps head turns, x5 reps head nods. With arms extended holding 1# ball trunk rotations to R/L tracking ball with head/eyes x5 reps, cued for posture when performing                      PT Short Term Goals - 11/15/21 0936       PT SHORT TERM GOAL #1   Title Pt will perform basic transfers wheelchair to/from mat using squat pivot transfer with supervision.  Upgrade - Perform basic transfers modified independently. (Target date all STG's 12-14-21)    Baseline performed with supervision in previous session    Time 4    Period Weeks    Status Achieved    Target Date 12/14/21      PT SHORT TERM GOAL #2   Title Pt will improve Berg score from 27/56 to >/= 32/56 to demo improved balance with reduced fall risk.  Upgraded goal - increase score to >/= 40/56    Baseline 27/56; 35/56 11-15-21    Time 4    Period Weeks    Status New    Target Date 12/14/21      PT SHORT TERM GOAL #3   Title Improve TUG score from 1".07 secs to </= 50 secs with RW with CGA.;  Upgraded goal - improve score from 31.75 secs to </= 25 secs with RW    Baseline 1" 7 secs with RW; 31.75 seconds on 11/13/21    Time 4    Period Weeks    Status New    Target Date 12/14/21      PT SHORT TERM GOAL #4   Title Perform sit to stand from mat to RW with SBA with normal BOS. Upgraded goal - perform sit to stand 5 times without UE support without LOB with SBA    Baseline met with using BUE support    Time 4    Period Weeks    Status New    Target Date 12/14/21      PT SHORT TERM GOAL #5   Title Amb. 22' with RW with CGA on flat, even surface. Upgraded goal 84' with RW with SBA on flat, even surface    Baseline 230' with CGA with RW on 11/13/21    Time 4    Period Weeks    Status New    Target Date 12/14/21      PT SHORT TERM GOAL #6   Title Perform HEP for standing balance with wife's  assistance.    Baseline  new balance exercises issued on 11-15-21    Time 4    Period Weeks    Status On-going    Target Date 12/14/21               PT Long Term Goals - 11/16/21 1046       PT LONG TERM GOAL #1   Title Pt will be modified independent with household amb. without use of assistive device.    Time 12    Period Weeks    Status New    Target Date 01/11/22      PT LONG TERM GOAL #2   Title Pt will amb. 1000' with SPC with SBA on flat even and uneven surfaces for increased community accessibility.    Time 12    Period Weeks    Status New    Target Date 01/11/22      PT LONG TERM GOAL #3   Title Improve TUG score to </= 25 secs with appropriate assistive device to demo improved functional mobility and to reduce fall risk.    Baseline 1".07 secs with RW - 10-16-21    Time 12    Period Weeks    Status New    Target Date 01/11/22      PT LONG TERM GOAL #4   Title Increase Berg score to >/= 45/56 to reduce fall risk.    Baseline 27/56    Time 12    Period Weeks    Status New    Target Date 01/11/22      PT LONG TERM GOAL #5   Title Increase gait velocity from .43 ft/sec to >/= 1.6 ft/sec with RW for increased gait efficiency.    Time 12    Period Weeks    Status New    Target Date 01/11/22      PT LONG TERM GOAL #6   Title Independent in updated HEP as appropriate.    Time 12    Period Weeks    Status New    Target Date 01/11/22                   Plan - 11/20/21 1404     Clinical Impression Statement Progressed gait distance today with pt able to ambulate 345' consecutively with RW without needing a seated rest break. Continued to need cues for R anterior weight shift for push off during gait. Remainder of session focused on quadruped and tall kneel activities with pt needing intermittent rest breaks due to fatigue. Will continue to progress towards LTGs.    Personal Factors and Comorbidities Comorbidity 2;Profession    Comorbidities  basilar artery stenosis, dyslipidemia,  HTN, neurogenic bladder, diplopia, anemia, Lt iliac artery stenosis, PAD    Examination-Activity Limitations Bathing;Carry;Continence;Toileting;Dressing;Lift;Stand;Stairs;Squat;Reach Overhead;Locomotion Level;Transfers    Examination-Participation Restrictions Cleaning;Community Activity;Driving;Interpersonal Relationship;Laundry;Shop;Occupation;Yard Work;Meal Prep    Stability/Clinical Decision Making Evolving/Moderate complexity    Rehab Potential Good    PT Frequency 2x / week    PT Duration 12 weeks    PT Treatment/Interventions ADLs/Self Care Home Management;Aquatic Therapy;DME Instruction;Gait training;Stair training;Therapeutic activities;Therapeutic exercise;Balance training;Neuromuscular re-education;Patient/family education;Vestibular    PT Next Visit Plan Give standing balance exercises (pics) for HEP; Cont with gait with RW, trunk/core stabilization exercises and standing balance exs. seated balance on disc.    PT Home Exercise Plan [id number]    Consulted and Agree with Plan of Care Patient;Family member/caregiver    Family Member Consulted wife, Maurice Little  Patient will benefit from skilled therapeutic intervention in order to improve the following deficits and impairments:  Abnormal gait, Decreased activity tolerance, Decreased balance, Decreased coordination, Dizziness, Impaired sensation, Impaired UE functional use, Impaired vision/preception  Visit Diagnosis: Other lack of coordination  Other symptoms and signs involving the nervous system  Unsteadiness on feet  Other abnormalities of gait and mobility     Problem List Patient Active Problem List   Diagnosis Date Noted   Familial hypercholesteremia 11/06/2021   PFO (patent foramen ovale) 11/06/2021   Urinary retention 09/27/2021   Diplopia 09/27/2021   Carotid stenosis, right 09/27/2021   Anemia 09/27/2021   Slow transit constipation    Benign essential HTN     Cerebellar stroke ([place]) 09/04/2021   Acute ischemic stroke [facility])    Cerebral embolism with cerebral infarction 08/30/2021   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

## 2021-11-21 ENCOUNTER — Encounter: Payer: Self-pay | Admitting: Physical Therapy

## 2021-11-21 ENCOUNTER — Ambulatory Visit: Payer: 59 | Admitting: Occupational Therapy

## 2021-11-21 ENCOUNTER — Telehealth (HOSPITAL_COMMUNITY): Payer: Self-pay

## 2021-11-21 ENCOUNTER — Ambulatory Visit: Payer: 59 | Admitting: Physical Therapy

## 2021-11-21 DIAGNOSIS — R29818 Other symptoms and signs involving the nervous system: Secondary | ICD-10-CM

## 2021-11-21 DIAGNOSIS — R278 Other lack of coordination: Secondary | ICD-10-CM

## 2021-11-21 DIAGNOSIS — R2681 Unsteadiness on feet: Secondary | ICD-10-CM

## 2021-11-21 DIAGNOSIS — R270 Ataxia, unspecified: Secondary | ICD-10-CM

## 2021-11-21 DIAGNOSIS — R208 Other disturbances of skin sensation: Secondary | ICD-10-CM

## 2021-11-21 DIAGNOSIS — R41842 Visuospatial deficit: Secondary | ICD-10-CM

## 2021-11-21 DIAGNOSIS — R2689 Other abnormalities of gait and mobility: Secondary | ICD-10-CM

## 2021-11-21 NOTE — Therapy (Signed)
[facility] [address] [place], [place], [address] Phone: [phone]   Fax:  [phone]  Occupational Therapy Treatment  Patient Details  Name: Maurice Little MRN: [id number] Date of Birth: 10/26/1956 Referring Provider (OT): Lauraine Rinne, PA-C   Encounter Date: 11/21/2021   OT End of Session - 11/21/21 0924     Visit Number 14    Number of Visits 25    Date for OT Re-Evaluation 12/31/21    Authorization Type UHC--no visit limit/no auth, copay per day    OT Start Time 0848    OT Stop Time 0928    OT Time Calculation (min) 40 min    Activity Tolerance Patient tolerated treatment well    Behavior During Therapy [facility] for tasks assessed/performed             Past Medical History:  Diagnosis Date   Benign prostatic hyperplasia with elevated prostate specific antigen (PSA)    Hypertension    Iliac artery stenosis, left ([facility])    with claudication resolved with stent   PAD (peripheral artery disease) ([place])    Stroke [facility])     Past Surgical History:  Procedure Laterality Date   BUBBLE STUDY  08/31/2021   Procedure: BUBBLE STUDY;  Surgeon: Skeet Latch, MD;  Location: [place];  Service: Cardiovascular;;   ILIAC ARTERY STENT Left    IR ANGIO VERTEBRAL SEL SUBCLAVIAN INNOMINATE UNI L MOD SED  08/30/2021   IR ANGIO VERTEBRAL SEL SUBCLAVIAN INNOMINATE UNI R MOD SED  08/30/2021   IR CT HEAD LTD  08/30/2021   IR INTRA CRAN STENT  08/30/2021   IR US GUIDE VASC ACCESS RIGHT  08/30/2021   RADIOLOGY WITH ANESTHESIA N/A 08/30/2021   Procedure: IR WITH ANESTHESIA;  Surgeon: Luanne Bras, MD;  Location: [place];  Service: Radiology;  Laterality: N/A;   TEE WITHOUT CARDIOVERSION N/A 08/31/2021   Procedure: TRANSESOPHAGEAL ECHOCARDIOGRAM (TEE);  Surgeon: Skeet Latch, MD;  Location: [place];  Service: Cardiovascular;  Laterality: N/A;    There were no vitals filed for this visit.   Subjective  Assessment - 11/21/21 1353     Subjective  Pt reports mild shoulder pain    Pertinent History CVA (R cereballar and R small R brachium potis and R pontomedullary junction).   PMH:  dyslipidemia, HTN, carotid stenosis, anemia, PAD    Limitations ataxia, diplopia, folley catheter, decr hearing (particularly R side)    Patient Stated Goals incr independence, get better    Currently in Pain? Yes    Pain Score 2     Pain Location Shoulder    Pain Orientation Right    Pain Descriptors / Indicators Aching    Pain Type Acute pain    Pain Onset 1 to 4 weeks ago    Pain Frequency Intermittent    Aggravating Factors  malpositioning while eating    Pain Relieving Factors repositioning                Treatment: Seated closed chain shoulder flexion with 1 lbs weight on each wrist, then pt progressed to diagonals, pt reports shoulder pain so this was discontinued. Pt transferred to mat scoot pivot. Shoulder and scapular retraction followed by closed chain chest press and shoulder flexion, min facilitation. Seated edge of mat with hands in external rotation, gentle scapular retraction/ upright posture. Simulated eating on tray table with good accuracy, min v.c for posture. Placing grooved pegs into pegboard with RUE, and removing with in hand  manipulation, min difficulty/ v.c                  OT Education - 11/21/21 1351     Education Details recommendations for eating at table or using tray table for improved positioning, pt reports currently holding his plate in one hand and eating with the other.    Person(s) Educated Patient    Methods Explanation;Demonstration;Verbal cues    Comprehension Verbalized understanding;Returned demonstration;Verbal cues required              OT Short Term Goals - 11/20/21 1028       OT SHORT TERM GOAL #1   Title Pt will be independent with initial HEP.--check STGs 11/02/21    Time 4    Period Weeks    Status Achieved      OT SHORT  TERM GOAL #2   Title Pt will perform BADLs mod I except supervision for shower transfer.    Time 4    Period Weeks    Status On-going   min A for back, supervision for standing to pull up pants.  11/20/21:  supervision for ambulation (mod I for tolieting at night with [facility]), supervision for shower transfer     OT SHORT TERM GOAL #3   Title Pt will perform tabletop visual scanning with 90% accuracy and no reports of diplopia.    Time 4    Period Weeks    Status Achieved      OT SHORT TERM GOAL #4   Title Pt will verbalize understanding of visual compensation strategies for ADLs/IADLs prn (including ?taping, head turns/positioning)    Time 4    Period Weeks    Status Achieved   Pt/ wife verbalize understanding     OT SHORT TERM GOAL #5   Title Pt will improve coordination for ADLs as shown by improving time on 9-hole peg test by at least 5sec bilaterally.    Baseline R-66.85sec, L-41.25sec    Time 4    Period Weeks    Status Achieved   R 65.25-not met yet improvedd, L 33.60- met partially met, met for LUE.  11/20/21:  R-51.75sec              OT Long Term Goals - 11/05/21 1000       OT LONG TERM GOAL #1   Title Pt will be independent with updated HEP.    Time 12    Period Weeks    Status New      OT LONG TERM GOAL #2   Title Pt will perform simple snack prep/home maintenance task with supervision.    Time 12    Period Weeks    Status New      OT LONG TERM GOAL #3   Title Pt will perform environmental scanning/navigation with at least 90% accuracy for incr safety in the home/community.    Time 12    Period Weeks    Status New      OT LONG TERM GOAL #4   Title Pt will improve RUE coordination as shown by improving time on 9-hole peg test by at least 15sec.    Baseline 66.85sec    Time 12    Period Weeks    Status New      OT LONG TERM GOAL #5   Title Pt will be able to retrieve/replace 1-2 objects on overhead shelf with each UE x10 safetly and with good control  for IADL tasks.    Time  12    Period Weeks    Status New      OT LONG TERM GOAL #6   Title Pt will be able to type with at least 80% accuracy with at least 20wpm in prep for work tasks.    Baseline 39% with 10wpm    Time 12    Period Weeks    Status New      OT LONG TERM GOAL #7   Title Pt will be able to write at least 4 sentences with 100% legibility in reasonable amount of time.    Time 12    Period Weeks    Status New                   Plan - 11/21/21 0109     Clinical Impression Statement Pt is progressing towards goals with improving UE control and strength. Pt reports RUE shoulder pain which he attributes to holding food in his left hand and feeding himself with right in a soft chair. Therapist recommends pt considers eating on a try table of at kitchen table to avoid hiking right shoulder.    OT Occupational Profile and History Detailed Assessment- Review of Records and additional review of physical, cognitive, psychosocial history related to current functional performance    Occupational performance deficits (Please refer to evaluation for details): ADL's;IADL's;Work;Leisure    Body Structure / Function / Physical Skills ADL;UE functional use;Balance;FMC;Coordination;Sensation;IADL;Decreased knowledge of use of DME;Dexterity;Vision    Rehab Potential Good    Clinical Decision Making Several treatment options, min-mod task modification necessary    Comorbidities Affecting Occupational Performance: May have comorbidities impacting occupational performance    Modification or Assistance to Complete Evaluation  Min-Moderate modification of tasks or assist with assess necessary to complete eval    OT Frequency 2x / week    OT Duration 12 weeks   +eval   OT Treatment/Interventions Self-care/ADL training;Energy conservation;Ultrasound;Visual/perceptual remediation/compensation;Patient/family education;DME and/or AE instruction;Aquatic Therapy;Paraffin;Passive range of  motion;Balance training;Cryotherapy;Functional Mobility Training;Moist Heat;Therapeutic exercise;Manual Therapy;Therapeutic activities;Neuromuscular education    Plan ?snack prep, neuro re-ed, coordination, functional mobility    OT Home Exercise Plan Pt reports that he has some coordination activities and visual HEP from hospital    Consulted and Agree with Plan of Care Patient;Family member/caregiver    Family Member Consulted wife             Patient will benefit from skilled therapeutic intervention in order to improve the following deficits and impairments:   Body Structure / Function / Physical Skills: ADL, UE functional use, Balance, FMC, Coordination, Sensation, IADL, Decreased knowledge of use of DME, Dexterity, Vision       Visit Diagnosis: Other lack of coordination  Other symptoms and signs involving the nervous system  Visuospatial deficit  Other disturbances of skin sensation  Ataxia  Unsteadiness on feet  Other abnormalities of gait and mobility    Problem List Patient Active Problem List   Diagnosis Date Noted   Familial hypercholesteremia 11/06/2021   PFO (patent foramen ovale) 11/06/2021   Urinary retention 09/27/2021   Diplopia 09/27/2021   Carotid stenosis, right 09/27/2021   Anemia 09/27/2021   Slow transit constipation    Benign essential HTN    Cerebellar stroke ([place]) 09/04/2021   Acute ischemic stroke ([facility])    Cerebral embolism with cerebral infarction 08/30/2021   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 [place], [place], [address] Phone: [phone]   Fax:  [phone]  Name: Maurice Little MRN: [id number] Date of Birth: 1955/11/12

## 2021-11-21 NOTE — Telephone Encounter (Signed)
Called to reschedule consult, no answer, left vm. AW  

## 2021-11-21 NOTE — Therapy (Signed)
[facility] [address] [place], [place], [address] Phone: [phone]   Fax:  [phone]  Physical Therapy Treatment  Patient Details  Name: Maurice Little MRN: [id number] Date of Birth: 21-Jun-1956 Referring Provider (PT): Lauraine Rinne, PA-C   Encounter Date: 11/21/2021   PT End of Session - 11/21/21 0935     Visit Number 10    Number of Visits 25    Date for PT Re-Evaluation 01/11/22    Authorization Type [employer] ([place] with no auth required)    PT Start Time 0933    PT Stop Time 1018    PT Time Calculation (min) 45 min    Equipment Utilized During Treatment Gait belt    Activity Tolerance Patient tolerated treatment well    Behavior During Therapy WFL for tasks assessed/performed             Past Medical History:  Diagnosis Date   Benign prostatic hyperplasia with elevated prostate specific antigen (PSA)    Hypertension    Iliac artery stenosis, left ([place])    with claudication resolved with stent   PAD (peripheral artery disease) ([place])    Stroke [facility])     Past Surgical History:  Procedure Laterality Date   BUBBLE STUDY  08/31/2021   Procedure: BUBBLE STUDY;  Surgeon: Skeet Latch, MD;  Location: [facility] [facility];  Service: Cardiovascular;;   ILIAC ARTERY STENT Left    IR ANGIO VERTEBRAL SEL SUBCLAVIAN INNOMINATE UNI L MOD SED  08/30/2021   IR ANGIO VERTEBRAL SEL SUBCLAVIAN INNOMINATE UNI R MOD SED  08/30/2021   IR CT HEAD LTD  08/30/2021   IR INTRA CRAN STENT  08/30/2021   IR US GUIDE VASC ACCESS RIGHT  08/30/2021   RADIOLOGY WITH ANESTHESIA N/A 08/30/2021   Procedure: IR WITH ANESTHESIA;  Surgeon: Luanne Bras, MD;  Location: [place];  Service: Radiology;  Laterality: N/A;   TEE WITHOUT CARDIOVERSION N/A 08/31/2021   Procedure: TRANSESOPHAGEAL ECHOCARDIOGRAM (TEE);  Surgeon: Skeet Latch, MD;  Location: [place];  Service: Cardiovascular;  Laterality: N/A;    There were no vitals filed  for this visit.   Subjective Assessment - 11/21/21 0936     Subjective Worked on some positioning strategies with OT while eating in order to help decr R shoulder pain. Sees the ophthamologist later today.    Patient is accompained by: Family member   [name] (wife)   Pertinent History Basilar artery stenosis, urinary retention with indwelling catheter, neurogenic bladder, diplopia, Rt cerebellar infarct, HTN, Rt carotid stenosis, anemia, s/p cerebral angiogram with stent    Limitations Lifting;Standing;Walking;House hold activities;Reading    Patient Stated Goals "I want to be normal again - walk without walker", decrease numbness on Rt side of body    Currently in Pain? Yes    Pain Score 2     Pain Location Shoulder    Pain Orientation Right    Pain Descriptors / Indicators Aching    Pain Type Acute pain    Pain Onset 1 to 4 weeks ago    Aggravating Factors  Not sure                               [facility] Adult PT Treatment/Exercise - 11/21/21 0953       Transfers   Transfers Sit to Stand;Stand to Sit    Sit to Stand 4: Min guard    Stand to Sit 4: Min guard  Comments From mat table x3 reps throughout session with BUE support from mat      Ambulation/Gait   Ambulation/Gait Yes    Ambulation/Gait Assistance 4: Min assist;4: Min guard    Ambulation/Gait Assistance Details When ambulating out of clinic at end of session, pt needing one episode of min A for balance due to pt taking too large of a step with RLE.    Ambulation Distance (Feet) 70 Feet   x2   Assistive device Rolling walker    Gait Pattern Step-through pattern;Ataxic;Decreased weight shift to right;Decreased weight shift to left;Wide base of support    Ambulation Surface Level;Indoor      Neuro Re-ed    Neuro Re-ed Details  Sitting on green balance disc: alternating marching x10 reps each side, LAQs x8 reps each side, alternating marching with UE lift (below shoulder level) x7 reps each side. Pt  needing intermittent UE support on mat for balance, incr difficulty with weight shift to L. Cues for core activation. Trunk rotations holding 1# ball x6 reps to R/L (keeping ball below shoulder level).                 Balance Exercises - 11/21/21 1028       Balance Exercises: Standing   Standing Eyes Opened Foam/compliant surface;Solid surface    Standing Eyes Opened Limitations Solid ground with feet closer than hip width: x10 reps head turns, x10 reps head nods - min guard for balance. With wide BOS on air ex: x30 second static hold, x10 reps weight shift M/L, x10 reps weight shift A/P- performing slowly with min guard    Standing Eyes Closed Limitations    Standing Eyes Closed Limitations Feet hip width distance 4 x 20-30 seconds, close min guard for balance,intermittent UE support                PT Education - 11/21/21 1030     Education Details Printed out new exercises verbally given as HEP last week, briefly reviewed with pt's spouse and pt for pt to perform with wife supervision.    Person(s) Educated Patient;Spouse    Methods Explanation;Demonstration;Handout    Comprehension Verbalized understanding              PT Short Term Goals - 11/15/21 0936       PT SHORT TERM GOAL #1   Title Pt will perform basic transfers wheelchair to/from mat using squat pivot transfer with supervision.  Upgrade - Perform basic transfers modified independently. (Target date all STG's 12-14-21)    Baseline performed with supervision in previous session    Time 4    Period Weeks    Status Achieved    Target Date 12/14/21      PT SHORT TERM GOAL #2   Title Pt will improve Berg score from 27/56 to >/= 32/56 to demo improved balance with reduced fall risk.  Upgraded goal - increase score to >/= 40/56    Baseline 27/56; 35/56 11-15-21    Time 4    Period Weeks    Status New    Target Date 12/14/21      PT SHORT TERM GOAL #3   Title Improve TUG score from 1".07 secs to </= 50  secs with RW with CGA.;  Upgraded goal - improve score from 31.75 secs to </= 25 secs with RW    Baseline 1" 7 secs with RW; 31.75 seconds on 11/13/21    Time 4    Period Weeks  Status New    Target Date 12/14/21      PT SHORT TERM GOAL #4   Title Perform sit to stand from mat to RW with SBA with normal BOS. Upgraded goal - perform sit to stand 5 times without UE support without LOB with SBA    Baseline met with using BUE support    Time 4    Period Weeks    Status New    Target Date 12/14/21      PT SHORT TERM GOAL #5   Title Amb. 71' with RW with CGA on flat, even surface. Upgraded goal 79' with RW with SBA on flat, even surface    Baseline 230' with CGA with RW on 11/13/21    Time 4    Period Weeks    Status New    Target Date 12/14/21      PT SHORT TERM GOAL #6   Title Perform HEP for standing balance with wife's assistance.    Baseline new balance exercises issued on 11-15-21    Time 4    Period Weeks    Status On-going    Target Date 12/14/21               PT Long Term Goals - 11/16/21 1046       PT LONG TERM GOAL #1   Title Pt will be modified independent with household amb. without use of assistive device.    Time 12    Period Weeks    Status New    Target Date 01/11/22      PT LONG TERM GOAL #2   Title Pt will amb. 1000' with SPC with SBA on flat even and uneven surfaces for increased community accessibility.    Time 12    Period Weeks    Status New    Target Date 01/11/22      PT LONG TERM GOAL #3   Title Improve TUG score to </= 25 secs with appropriate assistive device to demo improved functional mobility and to reduce fall risk.    Baseline 1".07 secs with RW - 10-16-21    Time 12    Period Weeks    Status New    Target Date 01/11/22      PT LONG TERM GOAL #4   Title Increase Berg score to >/= 45/56 to reduce fall risk.    Baseline 27/56    Time 12    Period Weeks    Status New    Target Date 01/11/22      PT LONG TERM GOAL #5    Title Increase gait velocity from .43 ft/sec to >/= 1.6 ft/sec with RW for increased gait efficiency.    Time 12    Period Weeks    Status New    Target Date 01/11/22      PT LONG TERM GOAL #6   Title Independent in updated HEP as appropriate.    Time 12    Period Weeks    Status New    Target Date 01/11/22                   Plan - 11/21/21 1033     Clinical Impression Statement Pt needing one episode of min A for balance during gait today with RW due to pt taking too big of a step with RLE. Worked on seated balance on compliant surfaces and standing balance in // bars working on [employer] UE support. With seated balance, pt  challenged by activities when weight shifting to L and lifting RLE. Pt tolerated session well, will continue to progress towards LTGs.    Personal Factors and Comorbidities Comorbidity 2;Profession    Comorbidities basilar artery stenosis, dyslipidemia,  HTN, neurogenic bladder, diplopia, anemia, Lt iliac artery stenosis, PAD    Examination-Activity Limitations Bathing;Carry;Continence;Toileting;Dressing;Lift;Stand;Stairs;Squat;Reach Overhead;Locomotion Level;Transfers    Examination-Participation Restrictions Cleaning;Community Activity;Driving;Interpersonal Relationship;Laundry;Shop;Occupation;Yard Work;Meal Prep    Stability/Clinical Decision Making Evolving/Moderate complexity    Rehab Potential Good    PT Frequency 2x / week    PT Duration 12 weeks    PT Treatment/Interventions ADLs/Self Care Home Management;Aquatic Therapy;DME Instruction;Gait training;Stair training;Therapeutic activities;Therapeutic exercise;Balance training;Neuromuscular re-education;Patient/family education;Vestibular    PT Next Visit Plan review and update HEP as needed. any update on aquatic therapy and scheduling? Cont with gait with RW, trunk/core stabilization exercises and standing balance exs. seated balance on disc.    PT Home Exercise Plan [email or web address]    Consulted and Agree with  Plan of Care Patient;Family member/caregiver    Family Member Consulted wife, [name]             Patient will benefit from skilled therapeutic intervention in order to improve the following deficits and impairments:  Abnormal gait, Decreased activity tolerance, Decreased balance, Decreased coordination, Dizziness, Impaired sensation, Impaired UE functional use, Impaired vision/preception  Visit Diagnosis: Other lack of coordination  Other symptoms and signs involving the nervous system  Unsteadiness on feet     Problem List Patient Active Problem List   Diagnosis Date Noted   Familial hypercholesteremia 11/06/2021   PFO (patent foramen ovale) 11/06/2021   Urinary retention 09/27/2021   Diplopia 09/27/2021   Carotid stenosis, right 09/27/2021   Anemia 09/27/2021   Slow transit constipation    Benign essential HTN    Cerebellar stroke ([place]) 09/04/2021   Acute ischemic stroke 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

## 2021-11-21 NOTE — Patient Instructions (Signed)
Access Code: [email or web address] URL: [email or web address] Date: 11/21/2021 Prepared by: Sherlie Ban  Program Notes Standing at walker: alternating forward, side, and backwards kicks nice and slow. Perform with Maurice Little by you. perform 5 reps of each.    Exercises Lateral Weight Shift - 2 x daily - 5 x weekly - 2 sets - 10 reps Standing Anterior Posterior Weight Shift with Chair - 2 x daily - 5 x weekly - 2 sets - 10 reps Mini Squat with Counter Support - 2 x daily - 5 x weekly - 1-2 sets - 10 reps Standing Balance with Eyes Closed - 2 x daily - 5 x weekly - 3 sets - 10-15 hold Sit to Stand with Armchair - 2 x daily - 5 x weekly - 2 sets - 5 reps Standing March with Counter Support - 1-2 x daily - 5 x weekly - 2 sets - 5 reps

## 2021-11-22 ENCOUNTER — Ambulatory Visit (HOSPITAL_COMMUNITY): Admission: RE | Admit: 2021-11-22 | Payer: 59 | Source: Ambulatory Visit

## 2021-11-22 ENCOUNTER — Encounter (HOSPITAL_COMMUNITY): Payer: Self-pay

## 2021-11-22 MED ORDER — PANTOPRAZOLE SODIUM 40 MG PO TBEC: 40.0000 mg | DELAYED_RELEASE_TABLET | Freq: Every day | ORAL | 0 refills | Status: DC

## 2021-11-22 MED ORDER — HYDRALAZINE HCL 10 MG PO TABS: 10.0000 mg | ORAL_TABLET | Freq: Three times a day (TID) | ORAL | 1 refills | Status: DC

## 2021-11-22 MED ORDER — AMLODIPINE BESYLATE 5 MG PO TABS: 5.0000 mg | ORAL_TABLET | Freq: Every day | ORAL | 0 refills | Status: DC

## 2021-11-22 NOTE — Addendum Note (Signed)
Addended by: DE Peru, Prabhnoor Ellenberger J on: 11/22/2021 09:36 AM   Modules accepted: Orders

## 2021-11-22 NOTE — Progress Notes (Unsigned)
Patient ID: Maurice Little                 DOB: 05/30/1956                    MRN: [id number]     HPI: Maurice Little is a 66 y.o. male patient referred to lipid clinic by Dr. Izora Ribas. PMH is significant for CVA (09/04/21), HTN, BPH, PAD s/p iliac artery stenosis (in 2016 10 x 37 mm expandable stent to the left common iliac artery 95% stenosis. 20% right common iliac artery stenosis). On 08/29/22, he had multiple strokes (likely atherosclerotic origin) and has lingering significant deficits including residual dizziness and ataxia when standing up. LVEF on echo on 11/22 was 60-65%. During hospitalization in 11/22, pt was started on atorvastatin 80mg  daily and advised to continue statin therapy post-discharge. At f/u visit on 11/06/21, pt reports tolerated rosuvastatin 40mg  daily well. Pt was advised to start Zetia 10mg  daily. Pt was unsure why he was supposed to add another medication when he was just 1 point above his LDL goal of <55. Discussed with pt about his options for preventative care through either lifestyle modifications (diet/exercise) or Zetia. Pt then agreed to start Zetia and get f/u lipid labs on 02/08/22. Pt was referred to the lipid clinic on 11/06/21 for consideration of PCSK9i therapy.   Today, - why atorva to rosuva switch? - refill not taking - physical training and OT 2x and on own - walking before  - eat more fruits and veggies, water --  beans  - no labs since Zetia -- assumed extra 20% lowering --> then would be below goal - <70 goal or <55 ?? - either way why PCSK9i???  - how is he doing on Zetia, refill for rosuva, lab visit - 12 pills burden   - adherence  - AE? - diet/exercise - PCSK9 Inhibitor Initiation: 60% LDL lowering, prevent heart attacks and strokes, stabilize/reduce plaque, anti-inflammatory - [employer] copay card $5  Current Medications: rosuvastatin 40mg  daily, Zetia 10mg  daily  Intolerances: atorvastatin 80mg  daily Risk Factors: ASCVD + PAD + FH ASCVD LDL goal: <55  mg/dL - ASCVD + PAD + FH ASCVD  Diet:   Exercise:   Family History: He and his wife were unaware of his FH.  No prior history of CAD in family  Social History: former smoker, never alcohol  Labs: (11/06/21: TGL 123, HDL 45, LDL 56 (rosuvastatin 40mg  daily) (08/29/21): TGL 82, HDL 52, LDL 220 (baseline no meds)  Past Medical History:  Diagnosis Date   Benign prostatic hyperplasia with elevated prostate specific antigen (PSA)    Hypertension    Iliac artery stenosis, left ([facility])    with claudication resolved with stent   PAD (peripheral artery disease) ([facility])    Stroke [facility])     Current Outpatient Medications on File Prior to Visit  Medication Sig Dispense Refill   acetaminophen (TYLENOL) 325 MG tablet Take 1-2 tablets (325-650 mg total) by mouth every 4 (four) hours as needed for mild pain.     amLODipine (NORVASC) 5 MG tablet Take 1 tablet (5 mg total) by mouth daily after supper. 90 tablet 0   artificial tears (LACRILUBE) OINT ophthalmic ointment Place into both eyes at bedtime. 5 g 0   aspirin EC 81 MG tablet Take 1 tablet (81 mg total) by mouth daily. Swallow whole. 300 tablet 11   citalopram (CELEXA) 10 MG tablet TAKE 1 TABLET BY MOUTH EVERY DAY 90 tablet 1  Cyanocobalamin (B-12 PO) Take 1 tablet by mouth daily.     ezetimibe (ZETIA) 10 MG tablet Take 1 tablet (10 mg total) by mouth daily. 90 tablet 3   hydrALAZINE (APRESOLINE) 10 MG tablet Take 1 tablet (10 mg total) by mouth every 8 (eight) hours. 90 tablet 1   losartan (COZAAR) 25 MG tablet Take 1 tablet (25 mg total) by mouth daily. 30 tablet 0   naphazoline-glycerin (CLEAR EYES REDNESS) 0.012-0.25 % SOLN Place 1-2 drops into the right eye 4 (four) times daily -  with meals and at bedtime. 30 mL 1   pantoprazole (PROTONIX) 40 MG tablet Take 1 tablet (40 mg total) by mouth daily. 30 tablet 0   rosuvastatin (CRESTOR) 40 MG tablet Take 1 tablet (40 mg total) by mouth daily. 30 tablet 0   senna-docusate (SENOKOT-S) 8.6-50 MG  tablet Take 2 tablets by mouth 2 (two) times daily. 120 tablet 0   sulfamethoxazole-trimethoprim (BACTRIM DS) 800-160 MG tablet Take 1 tablet by mouth 2 (two) times daily.     ticagrelor (BRILINTA) 90 MG TABS tablet Take 1 tablet (90 mg total) by mouth 2 (two) times daily. 60 tablet 1   No current facility-administered medications on file prior to visit.    Allergies  Allergen Reactions   Duloxetine Tinitus    Other reaction(s): Other (See Comments) Dizziness Dizziness, tinnitus     Assessment/Plan:  1. Hyperlipidemia -

## 2021-11-23 ENCOUNTER — Ambulatory Visit: Payer: 59 | Admitting: Physical Medicine & Rehabilitation

## 2021-11-23 ENCOUNTER — Ambulatory Visit: Payer: 59

## 2021-11-23 ENCOUNTER — Telehealth: Payer: Self-pay

## 2021-11-23 DIAGNOSIS — E7801 Familial hypercholesterolemia: Secondary | ICD-10-CM

## 2021-11-23 MED ORDER — ROSUVASTATIN CALCIUM 40 MG PO TABS: 40.0000 mg | ORAL_TABLET | Freq: Every day | ORAL | 3 refills | Status: DC

## 2021-11-23 NOTE — Addendum Note (Signed)
Addended by: Reve Crocket E on: 11/23/2021 10:35 AM   Modules accepted: Orders

## 2021-11-23 NOTE — Telephone Encounter (Addendum)
Reached out to the patient this morning - scheduled for lipid appt with PharmD this afternoon but appt not needed. LDL previously 56 on rosuvastatin 40mg  daily with goal < 55 due to hx of strokes and PAD. He was advised to start Zetia 10mg  daily which will bring him to goal.  During today's phone call, pt stated he has been adherent to rosuvastatin 40mg  daily with no problems. He stated that although he had picked up the Zetia prescription from the pharmacy, he decided to not start taking it. He wanted to focus on diet/exercise first before starting another medication since his LDL is near goal. He has starting to go to OT twice weekly and complete strength training exercises at home. He is also starting to change his diet by incorporating more fruits, veggies, and beans into his diet. He states that he already takes 12+ pills at home and would really not like to add another. He would like to get labs earlier than April to see if his lifestyle changes make an impact on his levels.   Discussed with pt that his LDL was 56 on a high intensity statin, so the addition of Zetia 10mg  daily would get him to his LDL goal. However, as pt prefers to focus on diet and exercise, will recheck labs in 8 weeks after lifestyle changes have been made and hold off on Zetia start. F/u labs were moved up and scheduled for March 31st. Discussed with patient that he will keep his appointment with Dr. Gasper Sells on 02/10/22 and lab results can be discussed then. PharmD appt canceled for today.

## 2021-11-24 ENCOUNTER — Other Ambulatory Visit (HOSPITAL_BASED_OUTPATIENT_CLINIC_OR_DEPARTMENT_OTHER): Payer: Self-pay | Admitting: Family Medicine

## 2021-11-26 ENCOUNTER — Other Ambulatory Visit: Payer: Self-pay

## 2021-11-26 ENCOUNTER — Ambulatory Visit (HOSPITAL_COMMUNITY)
Admission: RE | Admit: 2021-11-26 | Discharge: 2021-11-26 | Disposition: A | Discharge status: Home / Self Care | Payer: 59 | Source: Ambulatory Visit | Attending: Interventional Radiology | Admitting: Interventional Radiology

## 2021-11-26 DIAGNOSIS — I771 Stricture of artery: Secondary | ICD-10-CM

## 2021-11-27 ENCOUNTER — Encounter: Payer: Self-pay | Admitting: Occupational Therapy

## 2021-11-27 ENCOUNTER — Ambulatory Visit: Payer: 59 | Admitting: Physical Therapy

## 2021-11-27 ENCOUNTER — Ambulatory Visit: Payer: 59 | Admitting: Occupational Therapy

## 2021-11-27 DIAGNOSIS — R29818 Other symptoms and signs involving the nervous system: Secondary | ICD-10-CM

## 2021-11-27 DIAGNOSIS — R2681 Unsteadiness on feet: Secondary | ICD-10-CM

## 2021-11-27 DIAGNOSIS — R2689 Other abnormalities of gait and mobility: Secondary | ICD-10-CM

## 2021-11-27 DIAGNOSIS — R270 Ataxia, unspecified: Secondary | ICD-10-CM

## 2021-11-27 DIAGNOSIS — R278 Other lack of coordination: Secondary | ICD-10-CM

## 2021-11-27 DIAGNOSIS — R41842 Visuospatial deficit: Secondary | ICD-10-CM

## 2021-11-27 DIAGNOSIS — R208 Other disturbances of skin sensation: Secondary | ICD-10-CM

## 2021-11-27 NOTE — Therapy (Signed)
[facility] [address] [place], [place], [address] Phone: [phone]   Fax:  [phone]  Occupational Therapy Treatment  Patient Details  Name: Maurice Little MRN: [id number] Date of Birth: 03-09-1956 Referring Provider (OT): Lauraine Rinne, PA-C   Encounter Date: 11/27/2021   OT End of Session - 11/27/21 1044     Visit Number 15    Number of Visits 25    Date for OT Re-Evaluation 12/31/21    Authorization Type UHC--no visit limit/no auth, copay per day    OT Start Time 1022    OT Stop Time 1102    OT Time Calculation (min) 40 min    Activity Tolerance Patient tolerated treatment well    Behavior During Therapy [facility] for tasks assessed/performed             Past Medical History:  Diagnosis Date   Benign prostatic hyperplasia with elevated prostate specific antigen (PSA)    Hypertension    Iliac artery stenosis, left ([place])    with claudication resolved with stent   PAD (peripheral artery disease) ([place])    Stroke [facility])     Past Surgical History:  Procedure Laterality Date   BUBBLE STUDY  08/31/2021   Procedure: BUBBLE STUDY;  Surgeon: Skeet Latch, MD;  Location: [facility];  Service: Cardiovascular;;   ILIAC ARTERY STENT Left    IR ANGIO VERTEBRAL SEL SUBCLAVIAN INNOMINATE UNI L MOD SED  08/30/2021   IR ANGIO VERTEBRAL SEL SUBCLAVIAN INNOMINATE UNI R MOD SED  08/30/2021   IR CT HEAD LTD  08/30/2021   IR INTRA CRAN STENT  08/30/2021   IR US GUIDE VASC ACCESS RIGHT  08/30/2021   RADIOLOGY WITH ANESTHESIA N/A 08/30/2021   Procedure: IR WITH ANESTHESIA;  Surgeon: Luanne Bras, MD;  Location: [facility];  Service: Radiology;  Laterality: N/A;   TEE WITHOUT CARDIOVERSION N/A 08/31/2021   Procedure: TRANSESOPHAGEAL ECHOCARDIOGRAM (TEE);  Surgeon: Skeet Latch, MD;  Location: [facility];  Service: Cardiovascular;  Laterality: N/A;    There were no vitals filed for this visit.   Subjective  Assessment - 11/27/21 1042     Subjective  Pt reports that shoulder is not really hurting, but a little sore.  Pt reports that neuro ophthalmologist put him on antibiotic drops and wants him to continue with drops, salve and taping at night.  Pt reports that neurologist told him that L side sensation may not improve.  Wife reports pt was able to stand and shave with RUE and that he cut potatoes.  Eating at table more now which has helped shoulder pain.    Pertinent History CVA (R cereballar and R small R brachium potis and R pontomedullary junction).   PMH:  dyslipidemia, HTN, carotid stenosis, anemia, PAD    Limitations ataxia, diplopia, folley catheter, decr hearing (particularly R side)    Patient Stated Goals incr independence, get better    Currently in Pain? No/denies    Pain Onset 1 to 4 weeks ago             Sitting, closed-chain shoulder flex and diagonals with BUEs with ball with 1lb wt. On each wrist with min cueing for positioning.  Sitting, reviewed yellow theraband HEP for incr scapular stability with min cueing for positioning x10 reps each.  Sitting, tossing/catching medium ball with BUEs with min difficulty for incr visual scanning, coordination (forward and to each side for incr reaction time and trunk rotation).  Pt demo min difficulty, but  good timing with BUEs.    Standing, functional reaching laterally and across body (outside base of support) for incr balance in prep for IADLs with supervision for balance.    Educated pt/wife in ways to incr ADL/IADL performance at home:  coordination, theraband, and visual HEPs, folding towels (can be performed independently) and then washing dishes, loading dishwasher, standing to fold clothes, washing produce, unloading silverware with supervision.  Pt/wife verbalized understanding.            OT Short Term Goals - 11/27/21 1358       OT SHORT TERM GOAL #1   Title Pt will be independent with initial HEP.--check STGs  11/02/21    Time 4    Period Weeks    Status Achieved      OT SHORT TERM GOAL #2   Title Pt will perform BADLs mod I except supervision for shower transfer.    Time 4    Period Weeks    Status On-going   min A for back, supervision for standing to pull up pants.  11/20/21:  supervision for ambulation for toileting (mod I for tolieting at night with [facility]), supervision for shower transfer     OT SHORT TERM GOAL #3   Title Pt will perform tabletop visual scanning with 90% accuracy and no reports of diplopia.    Time 4    Period Weeks    Status Achieved      OT SHORT TERM GOAL #4   Title Pt will verbalize understanding of visual compensation strategies for ADLs/IADLs prn (including ?taping, head turns/positioning)    Time 4    Period Weeks    Status Achieved   Pt/ wife verbalize understanding     OT SHORT TERM GOAL #5   Title Pt will improve coordination for ADLs as shown by improving time on 9-hole peg test by at least 5sec bilaterally.    Baseline R-66.85sec, L-41.25sec    Time 4    Period Weeks    Status Achieved   R 65.25-not met yet improvedd, L 33.60- met partially met, met for LUE.  11/20/21:  R-51.75sec              OT Long Term Goals - 11/27/21 1359       OT LONG TERM GOAL #1   Title Pt will be independent with updated HEP.    Time 12    Period Weeks    Status New      OT LONG TERM GOAL #2   Title Pt will perform simple snack prep/home maintenance task with supervision.    Time 12    Period Weeks    Status New      OT LONG TERM GOAL #3   Title Pt will perform environmental scanning/navigation with at least 90% accuracy for incr safety in the home/community.    Time 12    Period Weeks    Status New      OT LONG TERM GOAL #4   Title Pt will improve RUE coordination as shown by improving time on 9-hole peg test by at least 15sec.--met.  11/27/21 upgraded:  Pt will complete 9-hole peg test in 45sec or less with RUE.    Baseline 66.85sec    Time 12    Period  Weeks    Status Revised      OT LONG TERM GOAL #5   Title Pt will be able to retrieve/replace 1-2 objects on overhead shelf with each UE x10 safetly  and with good control for IADL tasks.    Time 12    Period Weeks    Status New      OT LONG TERM GOAL #6   Title Pt will be able to type with at least 80% accuracy with at least 20wpm in prep for work tasks.    Baseline 39% with 10wpm    Time 12    Period Weeks    Status New      OT LONG TERM GOAL #7   Title Pt will be able to write at least 4 sentences with 100% legibility in reasonable amount of time.    Time 12    Period Weeks    Status New                   Plan - 11/27/21 1350     Clinical Impression Statement Pt is progressing towards goals with improving ADL and IADL performance.  Decr balance continues to limit IADL performance currently.    OT Occupational Profile and History Detailed Assessment- Review of Records and additional review of physical, cognitive, psychosocial history related to current functional performance    Occupational performance deficits (Please refer to evaluation for details): ADL's;IADL's;Work;Leisure    Body Structure / Function / Physical Skills ADL;UE functional use;Balance;FMC;Coordination;Sensation;IADL;Decreased knowledge of use of DME;Dexterity;Vision    Rehab Potential Good    Clinical Decision Making Several treatment options, min-mod task modification necessary    Comorbidities Affecting Occupational Performance: May have comorbidities impacting occupational performance    Modification or Assistance to Complete Evaluation  Min-Moderate modification of tasks or assist with assess necessary to complete eval    OT Frequency 2x / week    OT Duration 12 weeks   +eval   OT Treatment/Interventions Self-care/ADL training;Energy conservation;Ultrasound;Visual/perceptual remediation/compensation;Patient/family education;DME and/or AE instruction;Aquatic Therapy;Paraffin;Passive range of  motion;Balance training;Cryotherapy;Functional Mobility Training;Moist Heat;Therapeutic exercise;Manual Therapy;Therapeutic activities;Neuromuscular education    Plan neuro re-ed, coordination, functional mobility    OT Home Exercise Plan Pt reports that he has some coordination activities and visual HEP from hospital    Consulted and Agree with Plan of Care Patient;Family member/caregiver    Family Member Consulted wife             Patient will benefit from skilled therapeutic intervention in order to improve the following deficits and impairments:   Body Structure / Function / Physical Skills: ADL, UE functional use, Balance, FMC, Coordination, Sensation, IADL, Decreased knowledge of use of DME, Dexterity, Vision       Visit Diagnosis: Other lack of coordination  Other symptoms and signs involving the nervous system  Visuospatial deficit  Other disturbances of skin sensation  Ataxia  Unsteadiness on feet  Other abnormalities of gait and mobility    Problem List Patient Active Problem List   Diagnosis Date Noted   Familial hypercholesteremia 11/06/2021   PFO (patent foramen ovale) 11/06/2021   Urinary retention 09/27/2021   Diplopia 09/27/2021   Carotid stenosis, right 09/27/2021   Anemia 09/27/2021   Slow transit constipation    Benign essential HTN    Cerebellar stroke ([place]) 09/04/2021   Acute ischemic stroke ([facility])    Cerebral embolism with cerebral infarction 08/30/2021   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 [place], [place]   [address] [phone] phone [phone] 11/27/21 2:36 PM

## 2021-11-27 NOTE — Therapy (Signed)
[facility] [address] [place], [place], [address] Phone: [phone]   Fax:  [phone]  Physical Therapy Treatment  Patient Details  Name: Maurice Little MRN: [id number] Date of Birth: 28-Nov-1955 Referring Provider (PT): Lauraine Rinne, PA-C   Encounter Date: 11/27/2021   PT End of Session - 11/27/21 1940     Visit Number 11    Number of Visits 25    Date for PT Re-Evaluation 01/11/22    Authorization Type UHC ([place] with no auth required)    PT Start Time 1105    PT Stop Time 1145    PT Time Calculation (min) 40 min    Equipment Utilized During Treatment Gait belt    Activity Tolerance Patient tolerated treatment well    Behavior During Therapy WFL for tasks assessed/performed             Past Medical History:  Diagnosis Date   Benign prostatic hyperplasia with elevated prostate specific antigen (PSA)    Hypertension    Iliac artery stenosis, left ([place])    with claudication resolved with stent   PAD (peripheral artery disease) ([place])    Stroke [facility])     Past Surgical History:  Procedure Laterality Date   BUBBLE STUDY  08/31/2021   Procedure: BUBBLE STUDY;  Surgeon: Skeet Latch, MD;  Location: [facility];  Service: Cardiovascular;;   ILIAC ARTERY STENT Left    IR ANGIO VERTEBRAL SEL SUBCLAVIAN INNOMINATE UNI L MOD SED  08/30/2021   IR ANGIO VERTEBRAL SEL SUBCLAVIAN INNOMINATE UNI R MOD SED  08/30/2021   IR CT HEAD LTD  08/30/2021   IR INTRA CRAN STENT  08/30/2021   IR US GUIDE VASC ACCESS RIGHT  08/30/2021   RADIOLOGY WITH ANESTHESIA N/A 08/30/2021   Procedure: IR WITH ANESTHESIA;  Surgeon: Luanne Bras, MD;  Location: [facility];  Service: Radiology;  Laterality: N/A;   TEE WITHOUT CARDIOVERSION N/A 08/31/2021   Procedure: TRANSESOPHAGEAL ECHOCARDIOGRAM (TEE);  Surgeon: Skeet Latch, MD;  Location: [facility];  Service: Cardiovascular;  Laterality: N/A;    There were no vitals filed  for this visit.   Subjective Assessment - 11/27/21 1930     Subjective Pt states he saw opthalmologist last week - stated he had very dry eyes and was prescibed antiobiotics; no problems reported    Patient is accompained by: Family member   [name] (wife)   Pertinent History Basilar artery stenosis, urinary retention with indwelling catheter, neurogenic bladder, diplopia, Rt cerebellar infarct, HTN, Rt carotid stenosis, anemia, s/p cerebral angiogram with stent    Limitations Lifting;Standing;Walking;House hold activities;Reading    Patient Stated Goals "I want to be normal again - walk without walker", decrease numbness on Rt side of body    Currently in Pain? No/denies    Pain Onset 1 to 4 weeks ago                               [facility] Adult PT Treatment/Exercise - 11/27/21 0001       Transfers   Transfers Sit to Stand;Stand to Sit    Sit to Stand 4: Min guard    Stand to Sit 4: Min guard    Number of Reps Other reps (comment)   5 reps   Comments from high/low mat table      Ambulation/Gait   Ambulation/Gait Yes    Ambulation/Gait Assistance 4: Min guard   SBA for 40'  from treatment room to high/low mat table - CGA to min assist with LOB during turn   Ambulation Distance (Feet) 115 Feet   40' to amb. from OT appt held in exam room to high/low table   Assistive device Rolling walker    Gait Pattern Step-through pattern;Ataxic;Decreased weight shift to right;Decreased weight shift to left;Wide base of support    Ambulation Surface Level;Indoor    Gait Comments Pt amb. 10' x 4 reps inside // bars without UE support with CGA      Neuro Re-ed    Neuro Re-ed Details  Quadruped position on mat - pt transferred from seated to side sitting to quadruped with SBA:  transferred from quadruped to tall kneeling to quadruped 5 reps with cues to extend trunk in tall kneeling; 5 reps partial small mini squats                 Balance Exercises - 11/27/21 0001        Balance Exercises: Standing   Standing Eyes Opened Wide (BOA);Head turns;Solid surface;5 reps   horizontal head turns 5 reps - standing by cunter   Stepping Strategy Anterior;Posterior;5 reps   with UE support on counter, progressing to no UE support with min to mod assist   Other Standing Exercises Pt performed coordination/ balance ex. touching colored discs (2) with each foot straight ahead for 5 reps and then diagonals 3 reps with min assist for recovery of LOB                  PT Short Term Goals - 11/27/21 1945       PT SHORT TERM GOAL #1   Title Pt will perform basic transfers wheelchair to/from mat using squat pivot transfer with supervision.  Upgrade - Perform basic transfers modified independently. (Target date all STG's 12-14-21)    Baseline performed with supervision in previous session    Time 4    Period Weeks    Status Achieved    Target Date 12/14/21      PT SHORT TERM GOAL #2   Title Pt will improve Berg score from 27/56 to >/= 32/56 to demo improved balance with reduced fall risk.  Upgraded goal - increase score to >/= 40/56    Baseline 27/56; 35/56 11-15-21    Time 4    Period Weeks    Status New    Target Date 12/14/21      PT SHORT TERM GOAL #3   Title Improve TUG score from 1".07 secs to </= 50 secs with RW with CGA.;  Upgraded goal - improve score from 31.75 secs to </= 25 secs with RW    Baseline 1" 7 secs with RW; 31.75 seconds on 11/13/21    Time 4    Period Weeks    Status New    Target Date 12/14/21      PT SHORT TERM GOAL #4   Title Perform sit to stand from mat to RW with SBA with normal BOS. Upgraded goal - perform sit to stand 5 times without UE support without LOB with SBA    Baseline met with using BUE support    Time 4    Period Weeks    Status New    Target Date 12/14/21      PT SHORT TERM GOAL #5   Title Amb. 55' with RW with CGA on flat, even surface. Upgraded goal 52' with RW with SBA on flat, even surface  Baseline 230' with  CGA with RW on 11/13/21    Time 4    Period Weeks    Status New    Target Date 12/14/21      PT SHORT TERM GOAL #6   Title Perform HEP for standing balance with wife's assistance.    Baseline new balance exercises issued on 11-15-21    Time 4    Period Weeks    Status On-going    Target Date 12/14/21               PT Long Term Goals - 11/27/21 1946       PT LONG TERM GOAL #1   Title Pt will be modified independent with household amb. without use of assistive device.    Time 12    Period Weeks    Status New    Target Date 01/11/22      PT LONG TERM GOAL #2   Title Pt will amb. 1000' with SPC with SBA on flat even and uneven surfaces for increased community accessibility.    Time 12    Period Weeks    Status New    Target Date 01/11/22      PT LONG TERM GOAL #3   Title Improve TUG score to </= 25 secs with appropriate assistive device to demo improved functional mobility and to reduce fall risk.    Baseline 1".07 secs with RW - 10-16-21    Time 12    Period Weeks    Status New    Target Date 01/11/22      PT LONG TERM GOAL #4   Title Increase Berg score to >/= 45/56 to reduce fall risk.    Baseline 27/56    Time 12    Period Weeks    Status New    Target Date 01/11/22      PT LONG TERM GOAL #5   Title Increase gait velocity from .43 ft/sec to >/= 1.6 ft/sec with RW for increased gait efficiency.    Time 12    Period Weeks    Status New    Target Date 01/11/22      PT LONG TERM GOAL #6   Title Independent in updated HEP as appropriate.    Time 12    Period Weeks    Status New    Target Date 01/11/22                   Plan - 11/27/21 1941     Clinical Impression Statement Pt had LOB with turn during amb. from treatment room from OT appt to high low mat table, requiring min to CGA for recovery of LOB.  Pt reported quads "burning" with tall kneeling exercise with mini squats performed in tall kneeling.  Pt has more difficulty with coordination  and graded controlled movements of RLE than with LLE.  Pt did well with amb. without UE support on // bars at end of session, but does need UE support for safety with turns.  Cont with POC.    Personal Factors and Comorbidities Comorbidity 2;Profession    Comorbidities basilar artery stenosis, dyslipidemia,  HTN, neurogenic bladder, diplopia, anemia, Lt iliac artery stenosis, PAD    Examination-Activity Limitations Bathing;Carry;Continence;Toileting;Dressing;Lift;Stand;Stairs;Squat;Reach Overhead;Locomotion Level;Transfers    Examination-Participation Restrictions Cleaning;Community Activity;Driving;Interpersonal Relationship;Laundry;Shop;Occupation;Yard Work;Meal Prep    Stability/Clinical Decision Making Evolving/Moderate complexity    Rehab Potential Good    PT Frequency 2x / week    PT Duration 12 weeks  PT Treatment/Interventions ADLs/Self Care Home Management;Aquatic Therapy;DME Instruction;Gait training;Stair training;Therapeutic activities;Therapeutic exercise;Balance training;Neuromuscular re-education;Patient/family education;Vestibular    PT Next Visit Plan review and update HEP as needed. any update on aquatic therapy and scheduling? Cont with gait with RW, trunk/core stabilization exercises and standing balance exs. seated balance on disc.    PT Home Exercise Plan [id number]    Consulted and Agree with Plan of Care Patient;Family member/caregiver    Family Member Consulted wife, [name]             Patient will benefit from skilled therapeutic intervention in order to improve the following deficits and impairments:  Abnormal gait, Decreased activity tolerance, Decreased balance, Decreased coordination, Dizziness, Impaired sensation, Impaired UE functional use, Impaired vision/preception  Visit Diagnosis: Other lack of coordination  Other abnormalities of gait and mobility  Unsteadiness on feet     Problem List Patient Active Problem List   Diagnosis Date Noted   Familial  hypercholesteremia 11/06/2021   PFO (patent foramen ovale) 11/06/2021   Urinary retention 09/27/2021   Diplopia 09/27/2021   Carotid stenosis, right 09/27/2021   Anemia 09/27/2021   Slow transit constipation    Benign essential HTN    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

## 2021-11-28 ENCOUNTER — Telehealth (HOSPITAL_BASED_OUTPATIENT_CLINIC_OR_DEPARTMENT_OTHER): Payer: Self-pay | Admitting: Family Medicine

## 2021-11-28 HISTORY — PX: IR RADIOLOGIST EVAL & MGMT: IMG5224

## 2021-11-28 MED ORDER — LOSARTAN POTASSIUM 25 MG PO TABS: 25.0000 mg | ORAL_TABLET | Freq: Every day | ORAL | 5 refills | Status: DC

## 2021-11-28 NOTE — Telephone Encounter (Signed)
losartan (COZAAR) 25 MG tablet [[id number]]   PT is needing Rx  listed above filled was supposed to be filled already but looks liked [facility] did not. Pt stated this is a urgent mediation that pt is needing she is aware of the 72 hr. policy but she thought it was already being refilled with all his other medications.  [id number] [place], [place] [phone]  Please advise.

## 2021-11-28 NOTE — Telephone Encounter (Signed)
Rx sent 

## 2021-11-29 ENCOUNTER — Ambulatory Visit: Payer: 59 | Admitting: Occupational Therapy

## 2021-11-29 ENCOUNTER — Encounter: Payer: Self-pay | Admitting: Occupational Therapy

## 2021-11-29 ENCOUNTER — Ambulatory Visit: Payer: 59 | Attending: Family Medicine | Admitting: Physical Therapy

## 2021-11-29 ENCOUNTER — Other Ambulatory Visit: Payer: Self-pay

## 2021-11-29 DIAGNOSIS — R41842 Visuospatial deficit: Secondary | ICD-10-CM | POA: Diagnosis present

## 2021-11-29 DIAGNOSIS — R208 Other disturbances of skin sensation: Secondary | ICD-10-CM | POA: Insufficient documentation

## 2021-11-29 DIAGNOSIS — R29818 Other symptoms and signs involving the nervous system: Secondary | ICD-10-CM

## 2021-11-29 DIAGNOSIS — R270 Ataxia, unspecified: Secondary | ICD-10-CM

## 2021-11-29 DIAGNOSIS — R2689 Other abnormalities of gait and mobility: Secondary | ICD-10-CM | POA: Diagnosis not present

## 2021-11-29 DIAGNOSIS — R2681 Unsteadiness on feet: Secondary | ICD-10-CM | POA: Insufficient documentation

## 2021-11-29 DIAGNOSIS — R278 Other lack of coordination: Secondary | ICD-10-CM | POA: Insufficient documentation

## 2021-11-29 DIAGNOSIS — R260 Ataxic gait: Secondary | ICD-10-CM | POA: Diagnosis present

## 2021-11-29 NOTE — Therapy (Signed)
[facility] [address] [place], [place], [address] Phone: [phone]   Fax:  [phone]  Occupational Therapy Treatment  Patient Details  Name: Maurice Little MRN: [id number] Date of Birth: Nov 04, 1955 Referring Provider (OT): Lauraine Rinne, PA-C   Encounter Date: 11/29/2021   OT End of Session - 11/29/21 1015     Visit Number 16    Number of Visits 25    Date for OT Re-Evaluation 12/31/21    Authorization Type UHC--no visit limit/no auth, copay per day    OT Start Time 1017    OT Stop Time 1100    OT Time Calculation (min) 43 min    Activity Tolerance Patient tolerated treatment well    Behavior During Therapy [facility] for tasks assessed/performed             Past Medical History:  Diagnosis Date   Benign prostatic hyperplasia with elevated prostate specific antigen (PSA)    Hypertension    Iliac artery stenosis, left ([place])    with claudication resolved with stent   PAD (peripheral artery disease) ([place])    Stroke [facility])     Past Surgical History:  Procedure Laterality Date   BUBBLE STUDY  08/31/2021   Procedure: BUBBLE STUDY;  Surgeon: Skeet Latch, MD;  Location: [place];  Service: Cardiovascular;;   ILIAC ARTERY STENT Left    IR ANGIO VERTEBRAL SEL SUBCLAVIAN INNOMINATE UNI L MOD SED  08/30/2021   IR ANGIO VERTEBRAL SEL SUBCLAVIAN INNOMINATE UNI R MOD SED  08/30/2021   IR CT HEAD LTD  08/30/2021   IR INTRA CRAN STENT  08/30/2021   IR RADIOLOGIST EVAL & MGMT  11/28/2021   IR US GUIDE VASC ACCESS RIGHT  08/30/2021   RADIOLOGY WITH ANESTHESIA N/A 08/30/2021   Procedure: IR WITH ANESTHESIA;  Surgeon: Luanne Bras, MD;  Location: [place];  Service: Radiology;  Laterality: N/A;   TEE WITHOUT CARDIOVERSION N/A 08/31/2021   Procedure: TRANSESOPHAGEAL ECHOCARDIOGRAM (TEE);  Surgeon: Skeet Latch, MD;  Location: [place];  Service: Cardiovascular;  Laterality: N/A;    There were no vitals  filed for this visit.   Subjective Assessment - 11/29/21 1015     Pertinent History CVA (R cereballar and R small R brachium potis and R pontomedullary junction).   PMH:  dyslipidemia, HTN, carotid stenosis, anemia, PAD    Limitations ataxia, diplopia, folley catheter, decr hearing (particularly R side)    Patient Stated Goals incr independence, get better    Currently in Pain? No/denies    Pain Onset 1 to 4 weeks ago               Cooking task (cooking egg):  Using RW with walker tray, pt was able to gather needed items with supervision for balance.  Min cueing at times for RW placement to incr ease/safety.  Pt able to crack egg, monitor temperature of burner, and flip egg safely.  Reviewed safety precautions due to decr sensation LUE.  Therapist did move egg once pt placed on plate (as egg was touching L thumb as pt held plate with LUE)--emphasized that pt should make sure that he doesn't let hot food touch L hand (may need to place plate on counter instead).  Cued pt to preplan activity and get out all needed items first (including plate)--Discussed/recommended pt utilize "trash bowl" and have paper towels/dish cloth nearby initially as pt demo difficulty reaching trash can to throw away egg shell after cracking and needed cloth to  wipe hands after cracking.  Pt verbalized understanding of recommendations and precautions.  Recommended continued supervision for cooking tasks.  Completing [place]urdue Pegboard with R hand with incr time/min difficulty for coordination.  Practiced writing/copying sentences with approx 90% legibility.          OT Short Term Goals - 11/27/21 1358       OT SHORT TERM GOAL #1   Title Pt will be independent with initial HEP.--check STGs 11/02/21    Time 4    Period Weeks    Status Achieved      OT SHORT TERM GOAL #2   Title Pt will perform BADLs mod I except supervision for shower transfer.    Time 4    Period Weeks    Status On-going   min A for back,  supervision for standing to pull up pants.  11/20/21:  supervision for ambulation for toileting (mod I for tolieting at night with [facility]), supervision for shower transfer     OT SHORT TERM GOAL #3   Title Pt will perform tabletop visual scanning with 90% accuracy and no reports of diplopia.    Time 4    Period Weeks    Status Achieved      OT SHORT TERM GOAL #4   Title Pt will verbalize understanding of visual compensation strategies for ADLs/IADLs prn (including ?taping, head turns/positioning)    Time 4    Period Weeks    Status Achieved   Pt/ wife verbalize understanding     OT SHORT TERM GOAL #5   Title Pt will improve coordination for ADLs as shown by improving time on 9-hole peg test by at least 5sec bilaterally.    Baseline R-66.85sec, L-41.25sec    Time 4    Period Weeks    Status Achieved   R 65.25-not met yet improvedd, L 33.60- met partially met, met for LUE.  11/20/21:  R-51.75sec              OT Long Term Goals - 11/27/21 1359       OT LONG TERM GOAL #1   Title Pt will be independent with updated HEP.    Time 12    Period Weeks    Status New      OT LONG TERM GOAL #2   Title Pt will perform simple snack prep/home maintenance task with supervision.    Time 12    Period Weeks    Status New      OT LONG TERM GOAL #3   Title Pt will perform environmental scanning/navigation with at least 90% accuracy for incr safety in the home/community.    Time 12    Period Weeks    Status New      OT LONG TERM GOAL #4   Title Pt will improve RUE coordination as shown by improving time on 9-hole peg test by at least 15sec.--met.  11/27/21 upgraded:  Pt will complete 9-hole peg test in 45sec or less with RUE.    Baseline 66.85sec    Time 12    Period Weeks    Status Revised      OT LONG TERM GOAL #5   Title Pt will be able to retrieve/replace 1-2 objects on overhead shelf with each UE x10 safetly and with good control for IADL tasks.    Time 12    Period Weeks     Status New      OT LONG TERM GOAL #6   Title Pt  will be able to type with at least 80% accuracy with at least 20wpm in prep for work tasks.    Baseline 39% with 10wpm    Time 12    Period Weeks    Status New      OT LONG TERM GOAL #7   Title Pt will be able to write at least 4 sentences with 100% legibility in reasonable amount of time.    Time 12    Period Weeks    Status New                   Plan - 11/29/21 1015     Clinical Impression Statement Pt is progressing towards goals with improving ADL and IADL performance.  Decr balance continues to limit IADL performance currently.    OT Occupational Profile and History Detailed Assessment- Review of Records and additional review of physical, cognitive, psychosocial history related to current functional performance    Occupational performance deficits (Please refer to evaluation for details): ADL's;IADL's;Work;Leisure    Body Structure / Function / Physical Skills ADL;UE functional use;Balance;FMC;Coordination;Sensation;IADL;Decreased knowledge of use of DME;Dexterity;Vision    Rehab Potential Good    Clinical Decision Making Several treatment options, min-mod task modification necessary    Comorbidities Affecting Occupational Performance: May have comorbidities impacting occupational performance    Modification or Assistance to Complete Evaluation  Min-Moderate modification of tasks or assist with assess necessary to complete eval    OT Frequency 2x / week    OT Duration 12 weeks   +eval   OT Treatment/Interventions Self-care/ADL training;Energy conservation;Ultrasound;Visual/perceptual remediation/compensation;Patient/family education;DME and/or AE instruction;Aquatic Therapy;Paraffin;Passive range of motion;Balance training;Cryotherapy;Functional Mobility Training;Moist Heat;Therapeutic exercise;Manual Therapy;Therapeutic activities;Neuromuscular education    Plan neuro re-ed, coordination, functional mobility/balance    OT  Home Exercise Plan Pt reports that he has some coordination activities and visual HEP from hospital    Consulted and Agree with Plan of Care Patient;Family member/caregiver    Family Member Consulted wife             Patient will benefit from skilled therapeutic intervention in order to improve the following deficits and impairments:   Body Structure / Function / Physical Skills: ADL, UE functional use, Balance, FMC, Coordination, Sensation, IADL, Decreased knowledge of use of DME, Dexterity, Vision       Visit Diagnosis: Other lack of coordination  Other symptoms and signs involving the nervous system  Visuospatial deficit  Other disturbances of skin sensation  Ataxia  Unsteadiness on feet  Other abnormalities of gait and mobility    Problem List Patient Active Problem List   Diagnosis Date Noted   Familial hypercholesteremia 11/06/2021   PFO (patent foramen ovale) 11/06/2021   Urinary retention 09/27/2021   Diplopia 09/27/2021   Carotid stenosis, right 09/27/2021   Anemia 09/27/2021   Slow transit constipation    Benign essential HTN    Cerebellar stroke ([place]) 09/04/2021   Acute ischemic stroke [facility])    Cerebral embolism with cerebral infarction 08/30/2021   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 [place], [place], [address] Phone: [phone]   Fax:  [phone]  Name: Orestes Geiman MRN: [id number] Date of Birth: 18-Dec-1955   Vianne Bulls, OTR/L [facility] [address] [place], [place]  [address] [phone] phone [phone] 11/29/21 10:16 AM

## 2021-11-30 NOTE — Therapy (Signed)
[facility] [address] [place], [place], [address] Phone: [phone]   Fax:  [phone]  Physical Therapy Treatment  Patient Details  Name: Maurice Little MRN: [id number] Date of Birth: 08-Mar-1956 Referring Provider (PT): Lauraine Rinne, PA-C   Encounter Date: 11/29/2021   PT End of Session - 11/30/21 1712     Visit Number 12    Number of Visits 25    Date for PT Re-Evaluation 01/11/22    Authorization Type UHC ([place] with no auth required)    PT Start Time 1105    PT Stop Time 1145    PT Time Calculation (min) 40 min    Equipment Utilized During Treatment Gait belt    Activity Tolerance Patient tolerated treatment well    Behavior During Therapy WFL for tasks assessed/performed             Past Medical History:  Diagnosis Date   Benign prostatic hyperplasia with elevated prostate specific antigen (PSA)    Hypertension    Iliac artery stenosis, left ([place])    with claudication resolved with stent   PAD (peripheral artery disease) ([place])    Stroke [facility])     Past Surgical History:  Procedure Laterality Date   BUBBLE STUDY  08/31/2021   Procedure: BUBBLE STUDY;  Surgeon: Skeet Latch, MD;  Location: [place];  Service: Cardiovascular;;   ILIAC ARTERY STENT Left    IR ANGIO VERTEBRAL SEL SUBCLAVIAN INNOMINATE UNI L MOD SED  08/30/2021   IR ANGIO VERTEBRAL SEL SUBCLAVIAN INNOMINATE UNI R MOD SED  08/30/2021   IR CT HEAD LTD  08/30/2021   IR INTRA CRAN STENT  08/30/2021   IR RADIOLOGIST EVAL & MGMT  11/28/2021   IR US GUIDE VASC ACCESS RIGHT  08/30/2021   RADIOLOGY WITH ANESTHESIA N/A 08/30/2021   Procedure: IR WITH ANESTHESIA;  Surgeon: Luanne Bras, MD;  Location: [place];  Service: Radiology;  Laterality: N/A;   TEE WITHOUT CARDIOVERSION N/A 08/31/2021   Procedure: TRANSESOPHAGEAL ECHOCARDIOGRAM (TEE);  Surgeon: Skeet Latch, MD;  Location: [place];  Service: Cardiovascular;  Laterality:  N/A;    There were no vitals filed for this visit.   Subjective Assessment - 11/30/21 1658     Subjective Pt reports no problems; wife reports pt stood in kitchen last night (Wed. night) and cooked hamburgers at home    Patient is accompained by: Family member   Maurice Little (wife)   Pertinent History Basilar artery stenosis, urinary retention with indwelling catheter, neurogenic bladder, diplopia, Rt cerebellar infarct, HTN, Rt carotid stenosis, anemia, s/p cerebral angiogram with stent    Limitations Lifting;Standing;Walking;House hold activities;Reading    Patient Stated Goals "I want to be normal again - walk without walker", decrease numbness on Rt side of body    Currently in Pain? No/denies    Pain Onset 1 to 4 weeks ago                               [facility] Adult PT Treatment/Exercise - 11/30/21 0001       Transfers   Transfers Sit to Stand;Stand to Sit    Sit to Stand 4: Min guard    Stand to Sit 4: Min guard    Number of Reps Other reps (comment)   3 reps   Comments from mat table in treatment room      Ambulation/Gait   Ambulation/Gait Yes    Ambulation/Gait  Assistance 5: Supervision   SBA   Ambulation/Gait Assistance Details SBA only - assessing pt's ability to amb. to bathroom in his home independently with RW    Ambulation Distance (Feet) 50 Feet   5' no device with CGA from mat to wheelchair at end of session   Assistive device Rolling walker    Gait Pattern Step-through pattern;Ataxic;Decreased weight shift to right;Decreased weight shift to left;Wide base of support    Ambulation Surface Level;Indoor      Neuro Re-ed    Neuro Re-ed Details  Pt performed sitting on SitFit on edge of mat - knee extension with contralateral UE flexion 3 reps each side 5 sec hold; seated marching 10 reps each leg      Exercises   Exercises Knee/Hip      Knee/Hip Exercises: Machines for Strengthening   Cybex Leg Press bil. LE's 80# 3 sets 10 reps                        PT Short Term Goals - 11/30/21 1718       PT SHORT TERM GOAL #1   Title Pt will perform basic transfers wheelchair to/from mat using squat pivot transfer with supervision.  Upgrade - Perform basic transfers modified independently. (Target date all STG's 12-14-21)    Baseline performed with supervision in previous session    Time 4    Period Weeks    Status Achieved    Target Date 12/14/21      PT SHORT TERM GOAL #2   Title Pt will improve Berg score from 27/56 to >/= 32/56 to demo improved balance with reduced fall risk.  Upgraded goal - increase score to >/= 40/56    Baseline 27/56; 35/56 11-15-21    Time 4    Period Weeks    Status New    Target Date 12/14/21      PT SHORT TERM GOAL #3   Title Improve TUG score from 1".07 secs to </= 50 secs with RW with CGA.;  Upgraded goal - improve score from 31.75 secs to </= 25 secs with RW    Baseline 1" 7 secs with RW; 31.75 seconds on 11/13/21    Time 4    Period Weeks    Status New    Target Date 12/14/21      PT SHORT TERM GOAL #4   Title Perform sit to stand from mat to RW with SBA with normal BOS. Upgraded goal - perform sit to stand 5 times without UE support without LOB with SBA    Baseline met with using BUE support    Time 4    Period Weeks    Status New    Target Date 12/14/21      PT SHORT TERM GOAL #5   Title Amb. 49' with RW with CGA on flat, even surface. Upgraded goal 64' with RW with SBA on flat, even surface    Baseline 230' with CGA with RW on 11/13/21    Time 4    Period Weeks    Status New    Target Date 12/14/21      PT SHORT TERM GOAL #6   Title Perform HEP for standing balance with wife's assistance.    Baseline new balance exercises issued on 11-15-21    Time 4    Period Weeks    Status On-going    Target Date 12/14/21  PT Long Term Goals - 11/30/21 1719       PT LONG TERM GOAL #1   Title Pt will be modified independent with household amb. without use of  assistive device.    Time 12    Period Weeks    Status New    Target Date 01/11/22      PT LONG TERM GOAL #2   Title Pt will amb. 1000' with SPC with SBA on flat even and uneven surfaces for increased community accessibility.    Time 12    Period Weeks    Status New    Target Date 01/11/22      PT LONG TERM GOAL #3   Title Improve TUG score to </= 25 secs with appropriate assistive device to demo improved functional mobility and to reduce fall risk.    Baseline 1".07 secs with RW - 10-16-21    Time 12    Period Weeks    Status New    Target Date 01/11/22      PT LONG TERM GOAL #4   Title Increase Berg score to >/= 45/56 to reduce fall risk.    Baseline 27/56    Time 12    Period Weeks    Status New    Target Date 01/11/22      PT LONG TERM GOAL #5   Title Increase gait velocity from .43 ft/sec to >/= 1.6 ft/sec with RW for increased gait efficiency.    Time 12    Period Weeks    Status New    Target Date 01/11/22      PT LONG TERM GOAL #6   Title Independent in updated HEP as appropriate.    Time 12    Period Weeks    Status New    Target Date 01/11/22                   Plan - 11/30/21 1714     Clinical Impression Statement Pt did well with amb. with RW from treatment room to leg press with only SBA with no physical assist needed or given.  Pt and wife informed that pt appears to be safe and stable enough to ambulate to bathroom with RW independently at home.  Pt performed leg press exercise for first time in today's session.  Pt did well with standing balance exercises with no LOB occurring. Cont with POC.    Personal Factors and Comorbidities Comorbidity 2;Profession    Comorbidities basilar artery stenosis, dyslipidemia,  HTN, neurogenic bladder, diplopia, anemia, Lt iliac artery stenosis, PAD    Examination-Activity Limitations Bathing;Carry;Continence;Toileting;Dressing;Lift;Stand;Stairs;Squat;Reach Overhead;Locomotion Level;Transfers     Examination-Participation Restrictions Cleaning;Community Activity;Driving;Interpersonal Relationship;Laundry;Shop;Occupation;Yard Work;Meal Prep    Stability/Clinical Decision Making Evolving/Moderate complexity    Rehab Potential Good    PT Frequency 2x / week    PT Duration 12 weeks    PT Treatment/Interventions ADLs/Self Care Home Management;Aquatic Therapy;DME Instruction;Gait training;Stair training;Therapeutic activities;Therapeutic exercise;Balance training;Neuromuscular re-education;Patient/family education;Vestibular    PT Next Visit Plan Pt scheduled for aquatic therapy on 2-14;  Cont with gait with RW, trunk/core stabilization exercises and standing balance exs. seated balance on disc.    PT Home Exercise Plan [id number]    Consulted and Agree with Plan of Care Patient;Family member/caregiver    Family Member Consulted wife, Maurice Little             Patient will benefit from skilled therapeutic intervention in order to improve the following deficits and impairments:  Abnormal gait, Decreased activity  tolerance, Decreased balance, Decreased coordination, Dizziness, Impaired sensation, Impaired UE functional use, Impaired vision/preception  Visit Diagnosis: Other abnormalities of gait and mobility  Other symptoms and signs involving the nervous system  Unsteadiness on feet     Problem List Patient Active Problem List   Diagnosis Date Noted   Familial hypercholesteremia 11/06/2021   PFO (patent foramen ovale) 11/06/2021   Urinary retention 09/27/2021   Diplopia 09/27/2021   Carotid stenosis, right 09/27/2021   Anemia 09/27/2021   Slow transit constipation    Benign essential HTN    Cerebellar stroke ([place]) 09/04/2021   Acute ischemic stroke [facility])    Cerebral embolism with cerebral infarction 08/30/2021   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 [place], [place], [address] Phone: [phone]   Fax:  [phone]  Name: Maurice Little MRN: [id number] Date of Birth: 1955-11-05

## 2021-12-04 ENCOUNTER — Other Ambulatory Visit: Payer: Self-pay

## 2021-12-04 ENCOUNTER — Ambulatory Visit: Payer: 59 | Admitting: Physical Therapy

## 2021-12-04 ENCOUNTER — Encounter: Payer: Self-pay | Admitting: Occupational Therapy

## 2021-12-04 ENCOUNTER — Ambulatory Visit: Payer: 59 | Admitting: Occupational Therapy

## 2021-12-04 DIAGNOSIS — R2681 Unsteadiness on feet: Secondary | ICD-10-CM

## 2021-12-04 DIAGNOSIS — R2689 Other abnormalities of gait and mobility: Secondary | ICD-10-CM | POA: Diagnosis not present

## 2021-12-04 DIAGNOSIS — R208 Other disturbances of skin sensation: Secondary | ICD-10-CM

## 2021-12-04 DIAGNOSIS — R29818 Other symptoms and signs involving the nervous system: Secondary | ICD-10-CM

## 2021-12-04 DIAGNOSIS — R270 Ataxia, unspecified: Secondary | ICD-10-CM

## 2021-12-04 DIAGNOSIS — R41842 Visuospatial deficit: Secondary | ICD-10-CM

## 2021-12-04 DIAGNOSIS — R278 Other lack of coordination: Secondary | ICD-10-CM

## 2021-12-04 NOTE — Therapy (Signed)
[facility] [address] [place], [place], [address] Phone: [phone]   Fax:  [phone]  Occupational Therapy Treatment  Patient Details  Name: Maurice Little MRN: [id number] Date of Birth: [birth year] Referring Provider (OT): Lauraine Rinne, PA-C   Encounter Date: 12/04/2021   OT End of Session - 12/04/21 0845     Visit Number 17    Number of Visits 25    Date for OT Re-Evaluation 12/31/21    Authorization Type UHC--no visit limit/no auth, copay per day    OT Start Time 0847    OT Stop Time 0928    OT Time Calculation (min) 41 min    Activity Tolerance Patient tolerated treatment well    Behavior During Therapy [facility] for tasks assessed/performed             Past Medical History:  Diagnosis Date   Benign prostatic hyperplasia with elevated prostate specific antigen (PSA)    Hypertension    Iliac artery stenosis, left (HCC)    with claudication resolved with stent   PAD (peripheral artery disease) ([place])    Stroke [facility])     Past Surgical History:  Procedure Laterality Date   BUBBLE STUDY  08/31/2021   Procedure: BUBBLE STUDY;  Surgeon: Skeet Latch, MD;  Location: [facility];  Service: Cardiovascular;;   ILIAC ARTERY STENT Left    IR ANGIO VERTEBRAL SEL SUBCLAVIAN INNOMINATE UNI L MOD SED  08/30/2021   IR ANGIO VERTEBRAL SEL SUBCLAVIAN INNOMINATE UNI R MOD SED  08/30/2021   IR CT HEAD LTD  08/30/2021   IR INTRA CRAN STENT  08/30/2021   IR RADIOLOGIST EVAL & MGMT  11/28/2021   IR US GUIDE VASC ACCESS RIGHT  08/30/2021   RADIOLOGY WITH ANESTHESIA N/A 08/30/2021   Procedure: IR WITH ANESTHESIA;  Surgeon: Luanne Bras, MD;  Location: [facility];  Service: Radiology;  Laterality: N/A;   TEE WITHOUT CARDIOVERSION N/A 08/31/2021   Procedure: TRANSESOPHAGEAL ECHOCARDIOGRAM (TEE);  Surgeon: Skeet Latch, MD;  Location: [facility];  Service: Cardiovascular;  Laterality: N/A;    There were no vitals  filed for this visit.   Subjective Assessment - 12/04/21 0845     Subjective  I had another dizzy spell last night (room spinning).  Pt reports that he is ambulating to the bathroom alone some now.    Pertinent History CVA (R cereballar and R small R brachium potis and R pontomedullary junction).   PMH:  dyslipidemia, HTN, carotid stenosis, anemia, PAD    Limitations ataxia, diplopia, folley catheter, decr hearing (particularly R side)    Patient Stated Goals incr independence, get better    Currently in Pain? Yes    Pain Score 3     Pain Location Shoulder    Pain Orientation Right    Pain Descriptors / Indicators Aching;Dull    Pain Type Acute pain    Pain Onset 1 to 4 weeks ago    Pain Frequency Intermittent    Aggravating Factors  quick movements    Pain Relieving Factors repositioning, rest              In supine, closed-chain shoulder flex and chest press with BUEs with ball, shoulder abduction with cane with BUEs with min v.c. for slowing down for incr control.  Tossing/catching medium ball in air with BUEs with mod difficulty, min cueing for timing and controlled movement.  Closed chain shoulder flex with 2kg weighted ball with BUEs  Prone, scapular exercises for  retraction with BUEs with shoulders in ext and then abduction.    Sitting, flipping cards with RUE with ER and trunk rotation (low range) and for incr coordination.  Standing, functional reaching with RUE to place small pegs in vertical pegboard for incr coordination, balance, and reach with LUE holding bowl of pegs (no UE support).  Copied design with 100% accuracy.  Multiple drops, butno LOB.  Pt removed using in-hand manipulation.        OT Short Term Goals - 12/04/21 0854       OT SHORT TERM GOAL #1   Title Pt will be independent with initial HEP.--check STGs 11/02/21    Time 4    Period Weeks    Status Achieved      OT SHORT TERM GOAL #2   Title Pt will perform BADLs mod I except supervision for  shower transfer.    Time 4    Period Weeks    Status Achieved   min A for back, supervision for standing to pull up pants.  11/20/21:  supervision for ambulation for toileting (mod I for tolieting at night with [facility]), supervision for shower transfer.  12/04/21:  now ambulating to restroom mod I     OT SHORT TERM GOAL #3   Title Pt will perform tabletop visual scanning with 90% accuracy and no reports of diplopia.    Time 4    Period Weeks    Status Achieved      OT SHORT TERM GOAL #4   Title Pt will verbalize understanding of visual compensation strategies for ADLs/IADLs prn (including ?taping, head turns/positioning)    Time 4    Period Weeks    Status Achieved   Pt/ wife verbalize understanding     OT SHORT TERM GOAL #5   Title Pt will improve coordination for ADLs as shown by improving time on 9-hole peg test by at least 5sec bilaterally.    Baseline R-66.85sec, L-41.25sec    Time 4    Period Weeks    Status Achieved   R 65.25-not met yet improvedd, L 33.60- met partially met, met for LUE.  11/20/21:  R-51.75sec              OT Long Term Goals - 11/29/21 1114       OT LONG TERM GOAL #1   Title Pt will be independent with updated HEP.    Time 12    Period Weeks    Status New      OT LONG TERM GOAL #2   Title Pt will perform simple snack prep/home maintenance task with supervision.--11/29/21 met initial goal, updated 11/29/21:  Pt will peform simple cooking and home maintenance tasks mod I.    Time 12    Period Weeks    Status Revised      OT LONG TERM GOAL #3   Title Pt will perform environmental scanning/navigation with at least 90% accuracy for incr safety in the home/community.    Time 12    Period Weeks    Status New      OT LONG TERM GOAL #4   Title Pt will improve RUE coordination as shown by improving time on 9-hole peg test by at least 15sec.--met.  11/27/21 upgraded:  Pt will complete 9-hole peg test in 45sec or less with RUE.    Baseline 66.85sec    Time 12     Period Weeks    Status Revised      OT LONG  TERM GOAL #5   Title Pt will be able to retrieve/replace 1-2 objects on overhead shelf with each UE x10 safetly and with good control for IADL tasks.    Time 12    Period Weeks    Status New      OT LONG TERM GOAL #6   Title Pt will be able to type with at least 80% accuracy with at least 20wpm in prep for work tasks.    Baseline 39% with 10wpm    Time 12    Period Weeks    Status New      OT LONG TERM GOAL #7   Title Pt will be able to write at least 4 sentences with 100% legibility in reasonable amount of time.    Time 12    Period Weeks    Status On-going   11/29/21:  approx 90% legibility                  Plan - 12/04/21 0849     Clinical Impression Statement Pt is progressing towards goals with improving ADL performance.  Pt now ambulating to restroom with RW without supervision at times.    OT Occupational Profile and History Detailed Assessment- Review of Records and additional review of physical, cognitive, psychosocial history related to current functional performance    Occupational performance deficits (Please refer to evaluation for details): ADL's;IADL's;Work;Leisure    Body Structure / Function / Physical Skills ADL;UE functional use;Balance;FMC;Coordination;Sensation;IADL;Decreased knowledge of use of DME;Dexterity;Vision    Rehab Potential Good    Clinical Decision Making Several treatment options, min-mod task modification necessary    Comorbidities Affecting Occupational Performance: May have comorbidities impacting occupational performance    Modification or Assistance to Complete Evaluation  Min-Moderate modification of tasks or assist with assess necessary to complete eval    OT Frequency 2x / week    OT Duration 12 weeks   +eval   OT Treatment/Interventions Self-care/ADL training;Energy conservation;Ultrasound;Visual/perceptual remediation/compensation;Patient/family education;DME and/or AE  instruction;Aquatic Therapy;Paraffin;Passive range of motion;Balance training;Cryotherapy;Functional Mobility Training;Moist Heat;Therapeutic exercise;Manual Therapy;Therapeutic activities;Neuromuscular education    Plan neuro re-ed, coordination, functional mobility/balance; montior R shoulder pain    OT Home Exercise Plan Pt reports that he has some coordination activities and visual HEP from hospital    Consulted and Agree with Plan of Care Patient;Family member/caregiver    Family Member Consulted wife             Patient will benefit from skilled therapeutic intervention in order to improve the following deficits and impairments:   Body Structure / Function / Physical Skills: ADL, UE functional use, Balance, FMC, Coordination, Sensation, IADL, Decreased knowledge of use of DME, Dexterity, Vision       Visit Diagnosis: Other lack of coordination  Other symptoms and signs involving the nervous system  Visuospatial deficit  Other disturbances of skin sensation  Ataxia  Unsteadiness on feet    Problem List Patient Active Problem List   Diagnosis Date Noted   Familial hypercholesteremia 11/06/2021   PFO (patent foramen ovale) 11/06/2021   Urinary retention 09/27/2021   Diplopia 09/27/2021   Carotid stenosis, right 09/27/2021   Anemia 09/27/2021   Slow transit constipation    Benign essential HTN    Cerebellar stroke ([place]) 09/04/2021   Acute ischemic stroke (HCC)    Cerebral embolism with cerebral infarction 08/30/2021   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 [place], [place], [address] Phone: [phone]   Fax:  [phone]  Name: Maurice Little MRN: [id number] Date of Birth: 11-18-1955  Vianne Bulls, OTR/L [facility] [address] [place], [place]  [address] [phone] phone [phone] 12/04/21 12:21 PM

## 2021-12-05 NOTE — Therapy (Signed)
[facility] [address] [place], [place], [address] Phone: [phone]   Fax:  [phone]  Physical Therapy Treatment  Patient Details  Name: Maurice Little MRN: [id number] Date of Birth: 1956-06-05 Referring Provider (PT): Lauraine Rinne, PA-C   Encounter Date: 12/04/2021   PT End of Session - 12/05/21 1539     Visit Number 13    Number of Visits 25    Date for PT Re-Evaluation 01/11/22    Authorization Type UHC ([place] with no auth required)    PT Start Time 1020    PT Stop Time 1100    PT Time Calculation (min) 40 min    Equipment Utilized During Treatment Gait belt    Activity Tolerance Patient tolerated treatment well    Behavior During Therapy WFL for tasks assessed/performed             Past Medical History:  Diagnosis Date   Benign prostatic hyperplasia with elevated prostate specific antigen (PSA)    Hypertension    Iliac artery stenosis, left ([place])    with claudication resolved with stent   PAD (peripheral artery disease) ([place])    Stroke [facility])     Past Surgical History:  Procedure Laterality Date   BUBBLE STUDY  08/31/2021   Procedure: BUBBLE STUDY;  Surgeon: Skeet Latch, MD;  Location: [place] [place];  Service: Cardiovascular;;   ILIAC ARTERY STENT Left    IR ANGIO VERTEBRAL SEL SUBCLAVIAN INNOMINATE UNI L MOD SED  08/30/2021   IR ANGIO VERTEBRAL SEL SUBCLAVIAN INNOMINATE UNI R MOD SED  08/30/2021   IR CT HEAD LTD  08/30/2021   IR INTRA CRAN STENT  08/30/2021   IR RADIOLOGIST EVAL & MGMT  11/28/2021   IR US GUIDE VASC ACCESS RIGHT  08/30/2021   RADIOLOGY WITH ANESTHESIA N/A 08/30/2021   Procedure: IR WITH ANESTHESIA;  Surgeon: Luanne Bras, MD;  Location: [facility];  Service: Radiology;  Laterality: N/A;   TEE WITHOUT CARDIOVERSION N/A 08/31/2021   Procedure: TRANSESOPHAGEAL ECHOCARDIOGRAM (TEE);  Surgeon: Skeet Latch, MD;  Location: [facility];  Service: Cardiovascular;  Laterality:  N/A;    There were no vitals filed for this visit.   Subjective Assessment - 12/05/21 1531     Subjective Pt reports he walked to bathroom by himself, using his walker, and had no problems    Patient is accompained by: Family member   [name] (wife)   Pertinent History Basilar artery stenosis, urinary retention with indwelling catheter, neurogenic bladder, diplopia, Rt cerebellar infarct, HTN, Rt carotid stenosis, anemia, s/p cerebral angiogram with stent    Limitations Lifting;Standing;Walking;House hold activities;Reading    Patient Stated Goals "I want to be normal again - walk without walker", decrease numbness on Rt side of body    Currently in Pain? Yes    Pain Score 3     Pain Location Shoulder    Pain Orientation Right    Pain Descriptors / Indicators Aching;Dull    Pain Type Acute pain    Pain Onset 1 to 4 weeks ago                               [facility] Adult PT Treatment/Exercise - 12/05/21 0001       Transfers   Transfers Sit to Stand;Stand to Sit    Sit to Stand 4: Min guard    Stand to Sit 4: Min guard    Number of Reps  Other reps (comment)   3 reps   Comments from mat table in gym to RW      Ambulation/Gait   Ambulation/Gait Yes    Ambulation/Gait Assistance 5: Supervision;4: Min guard    Ambulation/Gait Assistance Details inside // bars 10' x 4 reps without UE support    Ambulation Distance (Feet) 40 Feet   115' with CGA to min assist +1 (2nd person present but was not needed due to no LOB)   Assistive device None    Gait Pattern Step-through pattern;Ataxic;Decreased weight shift to right;Decreased weight shift to left;Wide base of support    Ambulation Surface Level;Indoor    Gait Comments SPC was trialed inside // bars for safety but pt had difficulty with the coordination and sequencing; it was determined that pt was safer to amb. with no device than with [facility] (2-Rh)      Knee/Hip Exercises: Machines for Strengthening   Cybex Leg Press bil. LE's  100# 3 sets 10 reps      Knee/Hip Exercises: Standing   Hip Flexion Stengthening;Right;Left;1 set;10 reps   3#   Hip Abduction Stengthening;Right;Left;1 set;10 reps   3#   Hip Extension Stengthening;Right;Left;1 set;10 reps;Knee straight   3#                Balance Exercises - 12/05/21 0001       Balance Exercises: Standing   Stepping Strategy Anterior;Lateral;5 reps   5 reps each leg, each direction; no UE support used          Attempted tall kneeling position at start of session - pt able to transition from sitting to side sitting to tall kneeling with CGA; Pt able to assume tall kneeling with CGA; performed RLE abduction/adduction 3 reps and had cramping in Lt hamstring So this activity was discontinued  Marching 10 reps inside // bars with UE support prn with min to CGA     PT Short Term Goals - 12/05/21 1543       PT SHORT TERM GOAL #1   Title Pt will perform basic transfers wheelchair to/from mat using squat pivot transfer with supervision.  Upgrade - Perform basic transfers modified independently. (Target date all STG's 12-14-21)    Baseline performed with supervision in previous session    Time 4    Period Weeks    Status Achieved    Target Date 12/14/21      PT SHORT TERM GOAL #2   Title Pt will improve Berg score from 27/56 to >/= 32/56 to demo improved balance with reduced fall risk.  Upgraded goal - increase score to >/= 40/56    Baseline 27/56; 35/56 11-15-21    Time 4    Period Weeks    Status New    Target Date 12/14/21      PT SHORT TERM GOAL #3   Title Improve TUG score from 1".07 secs to </= 50 secs with RW with CGA.;  Upgraded goal - improve score from 31.75 secs to </= 25 secs with RW    Baseline 1" 7 secs with RW; 31.75 seconds on 11/13/21    Time 4    Period Weeks    Status New    Target Date 12/14/21      PT SHORT TERM GOAL #4   Title Perform sit to stand from mat to RW with SBA with normal BOS. Upgraded goal - perform sit to stand 5  times without UE support without LOB with SBA    Baseline  met with using BUE support    Time 4    Period Weeks    Status New    Target Date 12/14/21      PT SHORT TERM GOAL #5   Title Amb. 57' with RW with CGA on flat, even surface. Upgraded goal 20' with RW with SBA on flat, even surface    Baseline 230' with CGA with RW on 11/13/21    Time 4    Period Weeks    Status New    Target Date 12/14/21      PT SHORT TERM GOAL #6   Title Perform HEP for standing balance with wife's assistance.    Baseline new balance exercises issued on 11-15-21    Time 4    Period Weeks    Status On-going    Target Date 12/14/21               PT Long Term Goals - 12/05/21 1543       PT LONG TERM GOAL #1   Title Pt will be modified independent with household amb. without use of assistive device.    Time 12    Period Weeks    Status New    Target Date 01/11/22      PT LONG TERM GOAL #2   Title Pt will amb. 1000' with SPC with SBA on flat even and uneven surfaces for increased community accessibility.    Time 12    Period Weeks    Status New    Target Date 01/11/22      PT LONG TERM GOAL #3   Title Improve TUG score to </= 25 secs with appropriate assistive device to demo improved functional mobility and to reduce fall risk.    Baseline 1".07 secs with RW - 10-16-21    Time 12    Period Weeks    Status New    Target Date 01/11/22      PT LONG TERM GOAL #4   Title Increase Berg score to >/= 45/56 to reduce fall risk.    Baseline 27/56    Time 12    Period Weeks    Status New    Target Date 01/11/22      PT LONG TERM GOAL #5   Title Increase gait velocity from .43 ft/sec to >/= 1.6 ft/sec with RW for increased gait efficiency.    Time 12    Period Weeks    Status New    Target Date 01/11/22      PT LONG TERM GOAL #6   Title Independent in updated HEP as appropriate.    Time 12    Period Weeks    Status New    Target Date 01/11/22                   Plan -  12/05/21 1540     Clinical Impression Statement Pt is progressing well with balance and gait; he was able to amb. 1 lap around track (115') with no device for first time today.  Min assist was given for safety with 2nd person present if needed, but pt had no major LOB so 2nd person did not need to provide assistance.  Pt also demonstrating increased control with stepping strategy exercise - continues to have some difficulty with accurate Rt foot placement and grading size of step when stepping posteriorly.  Cont with POC.    Personal Factors and Comorbidities Comorbidity 2;Profession    Comorbidities basilar artery  stenosis, dyslipidemia,  HTN, neurogenic bladder, diplopia, anemia, Lt iliac artery stenosis, PAD    Examination-Activity Limitations Bathing;Carry;Continence;Toileting;Dressing;Lift;Stand;Stairs;Squat;Reach Overhead;Locomotion Level;Transfers    Examination-Participation Restrictions Cleaning;Community Activity;Driving;Interpersonal Relationship;Laundry;Shop;Occupation;Yard Work;Meal Prep    Stability/Clinical Decision Making Evolving/Moderate complexity    Rehab Potential Good    PT Frequency 2x / week    PT Duration 12 weeks    PT Treatment/Interventions ADLs/Self Care Home Management;Aquatic Therapy;DME Instruction;Gait training;Stair training;Therapeutic activities;Therapeutic exercise;Balance training;Neuromuscular re-education;Patient/family education;Vestibular    PT Next Visit Plan Pt scheduled for aquatic therapy on 2-14;  Cont with gait with RW, trunk/core stabilization exercises and standing balance exs. seated balance on disc.    PT Home Exercise Plan [id number]    Consulted and Agree with Plan of Care Patient;Family member/caregiver    Family Member Consulted wife, [name]             Patient will benefit from skilled therapeutic intervention in order to improve the following deficits and impairments:  Abnormal gait, Decreased activity tolerance, Decreased balance,  Decreased coordination, Dizziness, Impaired sensation, Impaired UE functional use, Impaired vision/preception  Visit Diagnosis: Other abnormalities of gait and mobility  Unsteadiness on feet     Problem List Patient Active Problem List   Diagnosis Date Noted   Familial hypercholesteremia 11/06/2021   PFO (patent foramen ovale) 11/06/2021   Urinary retention 09/27/2021   Diplopia 09/27/2021   Carotid stenosis, right 09/27/2021   Anemia 09/27/2021   Slow transit constipation    Benign essential HTN    Cerebellar stroke ([place]) 09/04/2021   Acute ischemic stroke [facility])    Cerebral embolism with cerebral infarction 08/30/2021   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 [place], [place], [address] Phone: [phone]   Fax:  [phone]  Name: Ric Rosenberg MRN: [id number] Date of Birth: Oct 20, 1956

## 2021-12-06 ENCOUNTER — Ambulatory Visit: Payer: 59 | Admitting: Physical Therapy

## 2021-12-06 ENCOUNTER — Other Ambulatory Visit: Payer: Self-pay

## 2021-12-06 ENCOUNTER — Encounter: Payer: Self-pay | Admitting: Occupational Therapy

## 2021-12-06 ENCOUNTER — Ambulatory Visit: Payer: 59 | Admitting: Occupational Therapy

## 2021-12-06 DIAGNOSIS — R2689 Other abnormalities of gait and mobility: Secondary | ICD-10-CM | POA: Diagnosis not present

## 2021-12-06 DIAGNOSIS — R278 Other lack of coordination: Secondary | ICD-10-CM

## 2021-12-06 DIAGNOSIS — R29818 Other symptoms and signs involving the nervous system: Secondary | ICD-10-CM

## 2021-12-06 DIAGNOSIS — R208 Other disturbances of skin sensation: Secondary | ICD-10-CM

## 2021-12-06 DIAGNOSIS — R270 Ataxia, unspecified: Secondary | ICD-10-CM

## 2021-12-06 DIAGNOSIS — R2681 Unsteadiness on feet: Secondary | ICD-10-CM

## 2021-12-06 DIAGNOSIS — R41842 Visuospatial deficit: Secondary | ICD-10-CM

## 2021-12-06 NOTE — Therapy (Signed)
[facility] [address] [place], [place], [address] Phone: [phone]   Fax:  [phone]  Occupational Therapy Treatment  Patient Details  Name: Maurice Little MRN: [id number] Date of Birth: 1956/04/21 Referring Provider (OT): Lauraine Rinne, PA-C   Encounter Date: 12/06/2021   OT End of Session - 12/06/21 0844     Visit Number 18    Number of Visits 25    Date for OT Re-Evaluation 12/31/21    Authorization Type UHC--no visit limit/no auth, copay per day    OT Start Time [phone]    OT Stop Time 0930    OT Time Calculation (min) 44 min    Activity Tolerance Patient tolerated treatment well    Behavior During Therapy [facility] for tasks assessed/performed             Past Medical History:  Diagnosis Date   Benign prostatic hyperplasia with elevated prostate specific antigen (PSA)    Hypertension    Iliac artery stenosis, left ([place])    with claudication resolved with stent   PAD (peripheral artery disease) ([place])    Stroke [facility])     Past Surgical History:  Procedure Laterality Date   BUBBLE STUDY  08/31/2021   Procedure: BUBBLE STUDY;  Surgeon: Skeet Latch, MD;  Location: [place];  Service: Cardiovascular;;   ILIAC ARTERY STENT Left    IR ANGIO VERTEBRAL SEL SUBCLAVIAN INNOMINATE UNI L MOD SED  08/30/2021   IR ANGIO VERTEBRAL SEL SUBCLAVIAN INNOMINATE UNI R MOD SED  08/30/2021   IR CT HEAD LTD  08/30/2021   IR INTRA CRAN STENT  08/30/2021   IR RADIOLOGIST EVAL & MGMT  11/28/2021   IR US GUIDE VASC ACCESS RIGHT  08/30/2021   RADIOLOGY WITH ANESTHESIA N/A 08/30/2021   Procedure: IR WITH ANESTHESIA;  Surgeon: Luanne Bras, MD;  Location: [place];  Service: Radiology;  Laterality: N/A;   TEE WITHOUT CARDIOVERSION N/A 08/31/2021   Procedure: TRANSESOPHAGEAL ECHOCARDIOGRAM (TEE);  Surgeon: Skeet Latch, MD;  Location: [place];  Service: Cardiovascular;  Laterality: N/A;    There were no vitals  filed for this visit.   Subjective Assessment - 12/06/21 0844     Subjective  Pt reports only occasional diplopia, but reports that his vision hasn't improved as much as he'd hope.  Pt reports that every once in a while if he concentrates on the R eye, he has diplopia.  Hearing assessment is Friday.    Pertinent History CVA (R cereballar and R small R brachium potis and R pontomedullary junction).   PMH:  dyslipidemia, HTN, carotid stenosis, anemia, PAD    Limitations ataxia, diplopia, folley catheter, decr hearing (particularly R side)    Patient Stated Goals incr independence, get better    Currently in Pain? Yes    Pain Score 1     Pain Location Shoulder    Pain Orientation Right    Pain Descriptors / Indicators Aching    Pain Type Acute pain    Pain Onset 1 to 4 weeks ago    Pain Frequency Intermittent    Aggravating Factors  quick movements    Pain Relieving Factors rest, repositioning                 In supine, closed-chain shoulder flex and chest press with BUEs with ball with min v.c. for slowing down for incr control and R shoulder compensation.  Some "clicking heard/felt with R shoulder intermittently, but pt denies pain.  Prone,  scapular exercises for retraction with BUEs with shoulders in ext x20.  Then wt. Bearing on elbows with head/chest lift x15 for incr scapular stability.  Sitting, closed-chain shoulder flex with min cueing for R shoulder positioning.   Standing, functional reaching with RUE to place small pegs in vertical pegboard for incr coordination, balance, and reach with LUE holding bowl of pegs (no UE support).  Copied design with 100% accuracy.  Multiple drops, but no LOB.    Arm bike x24mn level 1 for reciprocal movement with cues/target of at least 25-30 rpms without rest and only min v.c. initially for positioning.          OT Short Term Goals - 12/04/21 0854       OT SHORT TERM GOAL #1   Title Pt will be independent with initial  HEP.--check STGs 11/02/21    Time 4    Period Weeks    Status Achieved      OT SHORT TERM GOAL #2   Title Pt will perform BADLs mod I except supervision for shower transfer.    Time 4    Period Weeks    Status Achieved   min A for back, supervision for standing to pull up pants.  11/20/21:  supervision for ambulation for toileting (mod I for [facility], supervision for shower transfer.  12/04/21:  now ambulating to restroom mod I     OT SHORT TERM GOAL #3   Title Pt will perform tabletop visual scanning with 90% accuracy and no reports of diplopia.    Time 4    Period Weeks    Status Achieved      OT SHORT TERM GOAL #4   Title Pt will verbalize understanding of visual compensation strategies for ADLs/IADLs prn (including ?taping, head turns/positioning)    Time 4    Period Weeks    Status Achieved   Pt/ wife verbalize understanding     OT SHORT TERM GOAL #5   Title Pt will improve coordination for ADLs as shown by improving time on 9-hole peg test by at least 5sec bilaterally.    Baseline R-66.85sec, L-41.25sec    Time 4    Period Weeks    Status Achieved   R 65.25-not met yet improvedd, L 33.60- met partially met, met for LUE.  11/20/21:  R-51.75sec              OT Long Term Goals - 11/29/21 1114       OT LONG TERM GOAL #1   Title Pt will be independent with updated HEP.    Time 12    Period Weeks    Status New      OT LONG TERM GOAL #2   Title Pt will perform simple snack prep/home maintenance task with supervision.--11/29/21 met initial goal, updated 11/29/21:  Pt will peform simple cooking and home maintenance tasks mod I.    Time 12    Period Weeks    Status Revised      OT LONG TERM GOAL #3   Title Pt will perform environmental scanning/navigation with at least 90% accuracy for incr safety in the home/community.    Time 12    Period Weeks    Status New      OT LONG TERM GOAL #4   Title Pt will improve RUE coordination as shown by improving time  on 9-hole peg test by at least 15sec.--met.  11/27/21 upgraded:  Pt will complete 9-hole peg test  in 45sec or less with RUE.    Baseline 66.85sec    Time 12    Period Weeks    Status Revised      OT LONG TERM GOAL #5   Title Pt will be able to retrieve/replace 1-2 objects on overhead shelf with each UE x10 safetly and with good control for IADL tasks.    Time 12    Period Weeks    Status New      OT LONG TERM GOAL #6   Title Pt will be able to type with at least 80% accuracy with at least 20wpm in prep for work tasks.    Baseline 39% with 10wpm    Time 12    Period Weeks    Status New      OT LONG TERM GOAL #7   Title Pt will be able to write at least 4 sentences with 100% legibility in reasonable amount of time.    Time 12    Period Weeks    Status On-going   11/29/21:  approx 90% legibility                  Plan - 12/06/21 0845     Clinical Impression Statement Pt continues to make good progress with improving balance and coordination.    OT Occupational Profile and History Detailed Assessment- Review of Records and additional review of physical, cognitive, psychosocial history related to current functional performance    Occupational performance deficits (Please refer to evaluation for details): ADL's;IADL's;Work;Leisure    Body Structure / Function / Physical Skills ADL;UE functional use;Balance;FMC;Coordination;Sensation;IADL;Decreased knowledge of use of DME;Dexterity;Vision    Rehab Potential Good    Clinical Decision Making Several treatment options, min-mod task modification necessary    Comorbidities Affecting Occupational Performance: May have comorbidities impacting occupational performance    Modification or Assistance to Complete Evaluation  Min-Moderate modification of tasks or assist with assess necessary to complete eval    OT Frequency 2x / week    OT Duration 12 weeks   +eval   OT Treatment/Interventions Self-care/ADL training;Energy  conservation;Ultrasound;Visual/perceptual remediation/compensation;Patient/family education;DME and/or AE instruction;Aquatic Therapy;Paraffin;Passive range of motion;Balance training;Cryotherapy;Functional Mobility Training;Moist Heat;Therapeutic exercise;Manual Therapy;Therapeutic activities;Neuromuscular education    Plan neuro re-ed, coordination, functional mobility/balance; montior R shoulder pain prn    OT Home Exercise Plan Pt reports that he has some coordination activities and visual HEP from hospital    Consulted and Agree with Plan of Care Patient;Family member/caregiver    Family Member Consulted wife             Patient will benefit from skilled therapeutic intervention in order to improve the following deficits and impairments:   Body Structure / Function / Physical Skills: ADL, UE functional use, Balance, FMC, Coordination, Sensation, IADL, Decreased knowledge of use of DME, Dexterity, Vision       Visit Diagnosis: Other symptoms and signs involving the nervous system  Other lack of coordination  Unsteadiness on feet  Visuospatial deficit  Other disturbances of skin sensation  Ataxia    Problem List Patient Active Problem List   Diagnosis Date Noted   Familial hypercholesteremia 11/06/2021   PFO (patent foramen ovale) 11/06/2021   Urinary retention 09/27/2021   Diplopia 09/27/2021   Carotid stenosis, right 09/27/2021   Anemia 09/27/2021   Slow transit constipation    Benign essential HTN    Cerebellar stroke ([place]) 09/04/2021   Acute ischemic stroke ([facility])    Cerebral embolism with cerebral infarction 08/30/2021  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 [place], [place], [address] Phone: [phone]   Fax:  [phone]  Name: Maurice Little MRN: [id number] Date of Birth: 1955-11-22  Vianne Bulls, OTR/L [facility] [address] [place], [place]  [address] [phone] phone [phone] 12/06/21 9:36 AM

## 2021-12-06 NOTE — Patient Instructions (Signed)
° °  Strengthening: Hip Abduction - Resisted    With tubing around right leg, other side toward anchor, extend leg out from side. Repeat _10-15__ times per set. Do _1___ sets per session. Do __1__ sessions per day.     Strengthening: Hip Extension - Resisted    With tubing around right ankle, face anchor and pull leg straight back. Repeat _10-15___ times per set. Do __1__ sets per session. Do __1__ sessions per day.  http://orth.exer.us/636    Strengthening: Hip Flexion - Resisted    With tubing around left ankle, anchor behind, bring leg forward, keeping knee straight. Repeat _10-15___ times per set. Do __1__ sets per session. Do __1__ sessions per day.

## 2021-12-07 NOTE — Therapy (Signed)
[facility] [address] [place], [place], [address] Phone: [phone]   Fax:  [phone]  Physical Therapy Treatment  Patient Details  Name: Maurice Little MRN: [id number] Date of Birth: 1956-09-23 Referring Provider (PT): Lauraine Rinne, PA-C   Encounter Date: 12/06/2021   PT End of Session - 12/07/21 1132     Visit Number 14    Number of Visits 25    Date for PT Re-Evaluation 01/11/22    Authorization Type UHC ([place] with no auth required)    PT Start Time 1019    PT Stop Time 1103    PT Time Calculation (min) 44 min    Equipment Utilized During Treatment Gait belt    Activity Tolerance Patient tolerated treatment well    Behavior During Therapy [facility] for tasks assessed/performed             Past Medical History:  Diagnosis Date   Benign prostatic hyperplasia with elevated prostate specific antigen (PSA)    Hypertension    Iliac artery stenosis, left ([place])    with claudication resolved with stent   PAD (peripheral artery disease) ([place])    Stroke [facility])     Past Surgical History:  Procedure Laterality Date   BUBBLE STUDY  08/31/2021   Procedure: BUBBLE STUDY;  Surgeon: Skeet Latch, MD;  Location: [place];  Service: Cardiovascular;;   ILIAC ARTERY STENT Left    IR ANGIO VERTEBRAL SEL SUBCLAVIAN INNOMINATE UNI L MOD SED  08/30/2021   IR ANGIO VERTEBRAL SEL SUBCLAVIAN INNOMINATE UNI R MOD SED  08/30/2021   IR CT HEAD LTD  08/30/2021   IR INTRA CRAN STENT  08/30/2021   IR RADIOLOGIST EVAL & MGMT  11/28/2021   IR US GUIDE VASC ACCESS RIGHT  08/30/2021   RADIOLOGY WITH ANESTHESIA N/A 08/30/2021   Procedure: IR WITH ANESTHESIA;  Surgeon: Luanne Bras, MD;  Location: [place];  Service: Radiology;  Laterality: N/A;   TEE WITHOUT CARDIOVERSION N/A 08/31/2021   Procedure: TRANSESOPHAGEAL ECHOCARDIOGRAM (TEE);  Surgeon: Skeet Latch, MD;  Location: [place];  Service: Cardiovascular;  Laterality:  N/A;    There were no vitals filed for this visit.   Subjective Assessment - 12/07/21 1120     Subjective Pt reports he was really pleased with how he did on Tuesday in PT, with being able to walk a lap without using walker; states he is really looking forward to doing pool next week    Patient is accompained by: Family member   Maurice Little (wife)   Pertinent History Basilar artery stenosis, urinary retention with indwelling catheter, neurogenic bladder, diplopia, Rt cerebellar infarct, HTN, Rt carotid stenosis, anemia, s/p cerebral angiogram with stent    Limitations Lifting;Standing;Walking;House hold activities;Reading    Patient Stated Goals "I want to be normal again - walk without walker", decrease numbness on Rt side of body    Currently in Pain? Yes    Pain Score 1     Pain Location Shoulder    Pain Orientation Right    Pain Descriptors / Indicators Aching    Pain Type Acute pain    Pain Onset 1 to 4 weeks ago    Pain Frequency Intermittent                               [facility] Adult PT Treatment/Exercise - 12/07/21 0001       Transfers   Transfers Sit to Stand;Stand  to Sit    Sit to Stand 4: Min guard    Stand to Sit 4: Min guard    Number of Reps Other reps (comment)   5 reps   Comments from mat table in gym to RW      Ambulation/Gait   Ambulation/Gait Yes    Ambulation/Gait Assistance 4: Min assist    Ambulation Distance (Feet) 115 Feet   115' 2nd rep, 10' x 2 along counter with wife providing assistance (instructed wife on correct technique for guarding with amb. at home without device)   Assistive device None    Gait Pattern Step-through pattern;Ataxic;Decreased weight shift to right;Decreased weight shift to left;Wide base of support    Ambulation Surface Level;Indoor      Neuro Re-ed    Neuro Re-ed Details  Pt performed static standing by side of mat; performed alternate UE flexion 5 reps each with CGA; then performed lifting bil. UE's together to  90 degrees shoulder flexion 5 reps with CGA;  Pt then performed dynamic reaching activity with 4 cones from Rt side on computer table to Lt side to PT's hand in various locations; then performed from left side with cones placed on mat table - reaching with his Rt hand with slight trunk rotation to place each cone on Rt side on computer table - min to CGA for balance recovery      Knee/Hip Exercises: Standing   Hip Flexion Stengthening;Right;Left;1 set;10 reps   red band used for resistance   Hip Abduction Stengthening;Right;Left;1 set;10 reps   red theraband used for resistance   Hip Extension Stengthening;Right;Left;1 set;10 reps;Knee straight   red theraband used for resistance            Pt performed standing balance/coordination exercise by side of mat table - touched 3 balance bubbles 3 reps with each foot with UE  support prn with min to mod assist for balance recovery        PT Education - 12/07/21 1130     Education Details gave pics of hip strengthening exercises with use of red theraband - hip flexion, extension and abduction; instructed pt's wife, Maurice Little, in how to safely guard/assist pt with amb. at home without use of device; recommended that they first walk by counter for support prn on one side and her be present on other side, then gradually progress away from counter as they feel comfortable    Person(s) Educated Patient;Spouse    Methods Explanation;Demonstration    Comprehension Verbalized understanding;Returned demonstration              PT Short Term Goals - 12/07/21 1143       PT SHORT TERM GOAL #1   Title Pt will perform basic transfers wheelchair to/from mat using squat pivot transfer with supervision.  Upgrade - Perform basic transfers modified independently. (Target date all STG's 12-14-21)    Baseline performed with supervision in previous session    Time 4    Period Weeks    Status Achieved    Target Date 12/14/21      PT SHORT TERM GOAL #2    Title Pt will improve Berg score from 27/56 to >/= 32/56 to demo improved balance with reduced fall risk.  Upgraded goal - increase score to >/= 40/56    Baseline 27/56; 35/56 11-15-21    Time 4    Period Weeks    Status New    Target Date 12/14/21      PT SHORT TERM  GOAL #3   Title Improve TUG score from 1".07 secs to </= 50 secs with RW with CGA.;  Upgraded goal - improve score from 31.75 secs to </= 25 secs with RW    Baseline 1" 7 secs with RW; 31.75 seconds on 11/13/21    Time 4    Period Weeks    Status New    Target Date 12/14/21      PT SHORT TERM GOAL #4   Title Perform sit to stand from mat to RW with SBA with normal BOS. Upgraded goal - perform sit to stand 5 times without UE support without LOB with SBA    Baseline met with using BUE support    Time 4    Period Weeks    Status New    Target Date 12/14/21      PT SHORT TERM GOAL #5   Title Amb. 75' with RW with CGA on flat, even surface. Upgraded goal 14' with RW with SBA on flat, even surface    Baseline 230' with CGA with RW on 11/13/21    Time 4    Period Weeks    Status New    Target Date 12/14/21      PT SHORT TERM GOAL #6   Title Perform HEP for standing balance with wife's assistance.    Baseline new balance exercises issued on 11-15-21    Time 4    Period Weeks    Status On-going    Target Date 12/14/21               PT Long Term Goals - 12/07/21 1143       PT LONG TERM GOAL #1   Title Pt will be modified independent with household amb. without use of assistive device.    Time 12    Period Weeks    Status New    Target Date 01/11/22      PT LONG TERM GOAL #2   Title Pt will amb. 1000' with SPC with SBA on flat even and uneven surfaces for increased community accessibility.    Time 12    Period Weeks    Status New    Target Date 01/11/22      PT LONG TERM GOAL #3   Title Improve TUG score to </= 25 secs with appropriate assistive device to demo improved functional mobility and to reduce  fall risk.    Baseline 1".07 secs with RW - 10-16-21    Time 12    Period Weeks    Status New    Target Date 01/11/22      PT LONG TERM GOAL #4   Title Increase Berg score to >/= 45/56 to reduce fall risk.    Baseline 27/56    Time 12    Period Weeks    Status New    Target Date 01/11/22      PT LONG TERM GOAL #5   Title Increase gait velocity from .43 ft/sec to >/= 1.6 ft/sec with RW for increased gait efficiency.    Time 12    Period Weeks    Status New    Target Date 01/11/22      PT LONG TERM GOAL #6   Title Independent in updated HEP as appropriate.    Time 12    Period Weeks    Status New    Target Date 01/11/22  Plan - 12/07/21 1133     Clinical Impression Statement Pt is progressing very well towards goals with pt amb. 115' on track in clinic gym for 2 reps in today's session.  Pt was unsteady on initial rep as this activity was performed first, prior to any standing balance activity; pt used therapist's Lt hand with his Lt hand for assist and was able to complete lap (115') with min HHA.  2nd rep was performed near end of session and pt able to amb. without HHA with min assist only (wife present and observing during this rep of gait training).  Pt is improving with steadiness with sit to stand transfer but this transfer remains slow as pt perfoms steps of the transfer slow and controlled with much concentration.  Cont with POC.    Personal Factors and Comorbidities Comorbidity 2;Profession    Comorbidities basilar artery stenosis, dyslipidemia,  HTN, neurogenic bladder, diplopia, anemia, Lt iliac artery stenosis, PAD    Examination-Activity Limitations Bathing;Carry;Continence;Toileting;Dressing;Lift;Stand;Stairs;Squat;Reach Overhead;Locomotion Level;Transfers    Examination-Participation Restrictions Cleaning;Community Activity;Driving;Interpersonal Relationship;Laundry;Shop;Occupation;Yard Work;Meal Prep    Stability/Clinical Decision Making  Evolving/Moderate complexity    Rehab Potential Good    PT Frequency 2x / week    PT Duration 12 weeks    PT Treatment/Interventions ADLs/Self Care Home Management;Aquatic Therapy;DME Instruction;Gait training;Stair training;Therapeutic activities;Therapeutic exercise;Balance training;Neuromuscular re-education;Patient/family education;Vestibular    PT Next Visit Plan [place] - Aquatic therapy - please work on gait training and balance - both static and dynamic, coordination exercises, core stabilization (whatever you want :))   Pt's RLE is slightly more impaired than LLE (esp. decreased SLS on RLE compared to LLE);  pt has decreased hearing in Rt ear;                                                               LAND =  Cont with gait with RW, trunk/core stabilization exercises and standing balance exs. seated balance on disc.    PT Home Exercise Plan [id number]    Consulted and Agree with Plan of Care Patient;Family member/caregiver    Family Member Consulted wife, Maurice Little             Patient will benefit from skilled therapeutic intervention in order to improve the following deficits and impairments:  Abnormal gait, Decreased activity tolerance, Decreased balance, Decreased coordination, Dizziness, Impaired sensation, Impaired UE functional use, Impaired vision/preception  Visit Diagnosis: Other abnormalities of gait and mobility  Unsteadiness on feet  Other lack of coordination     Problem List Patient Active Problem List   Diagnosis Date Noted   Familial hypercholesteremia 11/06/2021   PFO (patent foramen ovale) 11/06/2021   Urinary retention 09/27/2021   Diplopia 09/27/2021   Carotid stenosis, right 09/27/2021   Anemia 09/27/2021   Slow transit constipation    Benign essential HTN    Cerebellar stroke ([place]) 09/04/2021   Acute ischemic stroke [facility])    Cerebral embolism with cerebral infarction 08/30/2021   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 [place], [place], [address] Phone: [phone]   Fax:  [phone]  Name: Rommel Hogston MRN: [id number] Date  of Birth: 1956-08-13

## 2021-12-10 ENCOUNTER — Ambulatory Visit: Payer: 59 | Admitting: Physical Therapy

## 2021-12-11 ENCOUNTER — Ambulatory Visit: Payer: 59 | Admitting: Occupational Therapy

## 2021-12-11 ENCOUNTER — Other Ambulatory Visit: Payer: Self-pay

## 2021-12-11 ENCOUNTER — Ambulatory Visit: Payer: 59 | Admitting: Physical Therapy

## 2021-12-11 ENCOUNTER — Encounter: Payer: Self-pay | Admitting: Occupational Therapy

## 2021-12-11 DIAGNOSIS — R2689 Other abnormalities of gait and mobility: Secondary | ICD-10-CM | POA: Diagnosis not present

## 2021-12-11 DIAGNOSIS — R29818 Other symptoms and signs involving the nervous system: Secondary | ICD-10-CM

## 2021-12-11 DIAGNOSIS — R208 Other disturbances of skin sensation: Secondary | ICD-10-CM

## 2021-12-11 DIAGNOSIS — R278 Other lack of coordination: Secondary | ICD-10-CM

## 2021-12-11 DIAGNOSIS — R2681 Unsteadiness on feet: Secondary | ICD-10-CM

## 2021-12-11 DIAGNOSIS — R41842 Visuospatial deficit: Secondary | ICD-10-CM

## 2021-12-11 DIAGNOSIS — R260 Ataxic gait: Secondary | ICD-10-CM

## 2021-12-11 NOTE — Patient Instructions (Signed)
   Aquatic Therapy: What to Expect!  Where:  [facility] [address] [place], [place] [address] [phone]           How to Prepare: Please make sure you drink 8 ounces of water about one hour prior to your pool session A caregiver must attend the entire session with the patient (unless your primary therapists feels this is not necessary). The caregiver will be responsible for assisting with dressing as well as any toileting needs.  Please arrive IN YOUR SUIT and a few minutes prior to your appointment - this helps to avoid delays in starting your session. Please make sure to attend to any toileting needs prior to entering the pool Once on the pool deck your therapist will ask you to sign the Patient  Consent and Assignment of Benefits form Your therapist may take your blood pressure prior to, during and after your session if indicated We usually try and create a home exercise program based on activities we do in the pool.  Please be thinking about who might be able to assist you in the pool should you want to participate in an aquatic home exercise program at the time of discharge.  Some patients do not want to or do not have the ability to participate in an aquatic home program - this is not a barrier in any way to you participating in aquatic therapy as part of your current therapy plan!    About the pool: Entering the pool Your therapist will assist you; there are multiple ways to enter including stairs with railings, a walk in ramp, a roll in chair and a mechanical lift. Your therapist will determine the most appropriate way for you. Water temperature is usually between 86-87 degrees There may be other swimmers in the pool at the same time     Contact Info:             Appointments: [facility]         All sessions are 45 minutes   [address]  [address]            Please call the [facility]  if   [place], [place]  [address]           you need to cancel or reschedule an appointment.  [phone]           Suzanne Dilday, PT    Karen Pulaski, OTR/L    04/19/20  

## 2021-12-11 NOTE — Therapy (Signed)
[facility] [address] [place], [place], [address] Phone: [phone]   Fax:  [phone]  Occupational Therapy Treatment  Patient Details  Name: Maurice Little MRN: [id number] Date of Birth: 08/03/1956 Referring Provider (OT): Lauraine Rinne, PA-C   Encounter Date: 12/11/2021   OT End of Session - 12/11/21 1000     Visit Number 19    Number of Visits 25    Date for OT Re-Evaluation 12/31/21    Authorization Type UHC--no visit limit/no auth, copay per day    OT Start Time 0935    OT Stop Time 1013    OT Time Calculation (min) 38 min    Activity Tolerance Patient tolerated treatment well    Behavior During Therapy [facility] for tasks assessed/performed             Past Medical History:  Diagnosis Date   Benign prostatic hyperplasia with elevated prostate specific antigen (PSA)    Hypertension    Iliac artery stenosis, left (HCC)    with claudication resolved with stent   PAD (peripheral artery disease) ([place])    Stroke [facility])     Past Surgical History:  Procedure Laterality Date   BUBBLE STUDY  08/31/2021   Procedure: BUBBLE STUDY;  Surgeon: Skeet Latch, MD;  Location: [facility];  Service: Cardiovascular;;   ILIAC ARTERY STENT Left    IR ANGIO VERTEBRAL SEL SUBCLAVIAN INNOMINATE UNI L MOD SED  08/30/2021   IR ANGIO VERTEBRAL SEL SUBCLAVIAN INNOMINATE UNI R MOD SED  08/30/2021   IR CT HEAD LTD  08/30/2021   IR INTRA CRAN STENT  08/30/2021   IR RADIOLOGIST EVAL & MGMT  11/28/2021   IR US GUIDE VASC ACCESS RIGHT  08/30/2021   RADIOLOGY WITH ANESTHESIA N/A 08/30/2021   Procedure: IR WITH ANESTHESIA;  Surgeon: Luanne Bras, MD;  Location: [facility];  Service: Radiology;  Laterality: N/A;   TEE WITHOUT CARDIOVERSION N/A 08/31/2021   Procedure: TRANSESOPHAGEAL ECHOCARDIOGRAM (TEE);  Surgeon: Skeet Latch, MD;  Location: [facility];  Service: Cardiovascular;  Laterality: N/A;    There were  no vitals filed for this visit.           Treatment: Pt performed shoulder flexion with ball however he reports RUE shoulder pain. Pt transitioned to supine for improved positioning.  Joint mobs to right shoulder and scapular retraction/ protraction.  In supine, closed-chain shoulder flex and chest press with BUEs with PVC pipe frame with min v.c. for slowing down for incr control and R shoulder compensation   Arm bike x6 min level 1 for reciprocal movement with cues/target of at least 25-30 rpms without rest and only min v.c. initially for positioning.  Sitting placing grooved pegs with RUE for increased fine motor coordination, then removing with in hand manipulation, min-mod difficulty and drops.              OT Short Term Goals - 12/04/21 0854       OT SHORT TERM GOAL #1   Title Pt will be independent with initial HEP.--check STGs 11/02/21    Time 4    Period Weeks    Status Achieved      OT SHORT TERM GOAL #2   Title Pt will perform BADLs mod I except supervision for shower transfer.    Time 4    Period Weeks    Status Achieved   min A for back, supervision for standing to pull up pants.  11/20/21:  supervision for ambulation for toileting (mod I for tolieting at night with [facility]), supervision for shower transfer.  12/04/21:  now ambulating to restroom mod I     OT SHORT TERM GOAL #3   Title Pt will perform tabletop visual scanning with 90% accuracy and no reports of diplopia.    Time 4    Period Weeks    Status Achieved      OT SHORT TERM GOAL #4   Title Pt will verbalize understanding of visual compensation strategies for ADLs/IADLs prn (including ?taping, head turns/positioning)    Time 4    Period Weeks    Status Achieved   Pt/ wife verbalize understanding     OT SHORT TERM GOAL #5   Title Pt will improve coordination for ADLs as shown by improving time on 9-hole peg test by at least 5sec bilaterally.    Baseline R-66.85sec, L-41.25sec    Time 4     Period Weeks    Status Achieved   R 65.25-not met yet improvedd, L 33.60- met partially met, met for LUE.  11/20/21:  R-51.75sec              OT Long Term Goals - 11/29/21 1114       OT LONG TERM GOAL #1   Title Pt will be independent with updated HEP.    Time 12    Period Weeks    Status New      OT LONG TERM GOAL #2   Title Pt will perform simple snack prep/home maintenance task with supervision.--11/29/21 met initial goal, updated 11/29/21:  Pt will peform simple cooking and home maintenance tasks mod I.    Time 12    Period Weeks    Status Revised      OT LONG TERM GOAL #3   Title Pt will perform environmental scanning/navigation with at least 90% accuracy for incr safety in the home/community.    Time 12    Period Weeks    Status New      OT LONG TERM GOAL #4   Title Pt will improve RUE coordination as shown by improving time on 9-hole peg test by at least 15sec.--met.  11/27/21 upgraded:  Pt will complete 9-hole peg test in 45sec or less with RUE.    Baseline 66.85sec    Time 12    Period Weeks    Status Revised      OT LONG TERM GOAL #5   Title Pt will be able to retrieve/replace 1-2 objects on overhead shelf with each UE x10 safetly and with good control for IADL tasks.    Time 12    Period Weeks    Status New      OT LONG TERM GOAL #6   Title Pt will be able to type with at least 80% accuracy with at least 20wpm in prep for work tasks.    Baseline 39% with 10wpm    Time 12    Period Weeks    Status New      OT LONG TERM GOAL #7   Title Pt will be able to write at least 4 sentences with 100% legibility in reasonable amount of time.    Time 12    Period Weeks    Status On-going   11/29/21:  approx 90% legibility                           Plan -  12/06/21 0845     Clinical Impression Statement Pt continues to make good progress with improving balance and coordination however pt remains limited by right shoulder pain. Shoulder pain appears to be  associated with posture and positioning.   OT Occupational Profile and History Detailed Assessment- Review of Records and additional review of physical, cognitive, psychosocial history related to current functional performance    Occupational performance deficits (Please refer to evaluation for details): ADL's;IADL's;Work;Leisure    Body Structure / Function / Physical Skills ADL;UE functional use;Balance;FMC;Coordination;Sensation;IADL;Decreased knowledge of use of DME;Dexterity;Vision    Rehab Potential Good    Clinical Decision Making Several treatment options, min-mod task modification necessary    Comorbidities Affecting Occupational Performance: May have comorbidities impacting occupational performance    Modification or Assistance to Complete Evaluation  Min-Moderate modification of tasks or assist with assess necessary to complete eval    OT Frequency 2x / week    OT Duration 12 weeks   +eval   OT Treatment/Interventions Self-care/ADL training;Energy conservation;Ultrasound;Visual/perceptual remediation/compensation;Patient/family education;DME and/or AE instruction;Aquatic Therapy;Paraffin;Passive range of motion;Balance training;Cryotherapy;Functional Mobility Training;Moist Heat;Therapeutic exercise;Manual Therapy;Therapeutic activities;Neuromuscular education    Plan neuro re-ed, coordination, functional mobility/balance; montior R shoulder pain prn    OT Home Exercise Plan Pt reports that he has some coordination activities and visual HEP from hospital    Consulted and Agree with Plan of Care Patient;Family member/caregiver    Family Member Consulted wife             Patient will benefit from skilled therapeutic intervention in order to improve the following deficits and impairments:   Body Structure / Function / Physical Skills: ADL, UE functional use, Balance, FMC, Coordination, Sensation, IADL, Decreased knowledge of use of DME, Dexterity, Vision        Visit  Diagnosis: Other symptoms and signs involving the nervous system  Other lack of coordination  Unsteadiness on feet  Visuospatial deficit  Other disturbances of skin sensation    Problem List Patient Active Problem List   Diagnosis Date Noted   Familial hypercholesteremia 11/06/2021   PFO (patent foramen ovale) 11/06/2021   Urinary retention 09/27/2021   Diplopia 09/27/2021   Carotid stenosis, right 09/27/2021   Anemia 09/27/2021   Slow transit constipation    Benign essential HTN    Cerebellar stroke ([place]) 09/04/2021   Acute ischemic stroke (HCC)    Cerebral embolism with cerebral infarction 08/30/2021   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

## 2021-12-12 NOTE — Therapy (Signed)
[facility] [address] [place], [place], [address] Phone: [phone]   Fax:  [phone]  Physical Therapy Treatment  Patient Details  Name: Maurice Little MRN: [id number] Date of Birth: [birth year] Referring Provider (PT): Lauraine Rinne, PA-C   Encounter Date: 12/11/2021   PT End of Session - 12/12/21 1415     Visit Number 15    Number of Visits 25    Date for PT Re-Evaluation 01/11/22    Authorization Type UHC ([place] with no auth required)    PT Start Time 1149    PT Stop Time 1230    PT Time Calculation (min) 41 min    Activity Tolerance Patient tolerated treatment well    Behavior During Therapy [facility] for tasks assessed/performed             Past Medical History:  Diagnosis Date   Benign prostatic hyperplasia with elevated prostate specific antigen (PSA)    Hypertension    Iliac artery stenosis, left ([place])    with claudication resolved with stent   PAD (peripheral artery disease) ([place])    Stroke [facility])     Past Surgical History:  Procedure Laterality Date   BUBBLE STUDY  08/31/2021   Procedure: BUBBLE STUDY;  Surgeon: Skeet Latch, MD;  Location: [facility];  Service: Cardiovascular;;   ILIAC ARTERY STENT Left    IR ANGIO VERTEBRAL SEL SUBCLAVIAN INNOMINATE UNI L MOD SED  08/30/2021   IR ANGIO VERTEBRAL SEL SUBCLAVIAN INNOMINATE UNI R MOD SED  08/30/2021   IR CT HEAD LTD  08/30/2021   IR INTRA CRAN STENT  08/30/2021   IR RADIOLOGIST EVAL & MGMT  11/28/2021   IR US GUIDE VASC ACCESS RIGHT  08/30/2021   RADIOLOGY WITH ANESTHESIA N/A 08/30/2021   Procedure: IR WITH ANESTHESIA;  Surgeon: Luanne Bras, MD;  Location: [facility];  Service: Radiology;  Laterality: N/A;   TEE WITHOUT CARDIOVERSION N/A 08/31/2021   Procedure: TRANSESOPHAGEAL ECHOCARDIOGRAM (TEE);  Surgeon: Skeet Latch, MD;  Location: [facility];  Service: Cardiovascular;  Laterality: N/A;    There were no vitals filed for this  visit.   Subjective Assessment - 12/12/21 1414     Subjective Patient arrived to pool with wife in manual wheelchair.  Very excited to start aquatic therapy.    Patient is accompained by: Family member   [name] (wife)   Pertinent History Basilar artery stenosis, urinary retention with indwelling catheter, neurogenic bladder, diplopia, Rt cerebellar infarct, HTN, Rt carotid stenosis, anemia, s/p cerebral angiogram with stent    Limitations Lifting;Standing;Walking;House hold activities;Reading    Patient Stated Goals "I want to be normal again - walk without walker", decrease numbness on Rt side of body    Currently in Pain? No/denies    Pain Onset 1 to 4 weeks ago             Patient seen for aquatic therapy today.  Treatment took place in water 3-4 feet deep depending upon activity.  Pt entered and exited the pool via stairs utilizing step to sequencing and UE support on rails.  Initiated gait training with use of water walker across width of pool multiple repetitions with focus on narrowing BOS, increasing L stance time, and increasing step and stride length.  Also incorporated retro gait and side stepping to L and R multiple repetitions to continue to focus on weight shifting in variety of directions.  Cues during side stepping to fully shift weight laterally before initiating ABD or ADD.  Increased balance challenge by adding in slow, high knee marching while ambulating forwards to increase weight shift and SLS.  Progressed lateral weight shifting challenge and coordination by performing grapevine (crossing over) to L and R multiple repetitions.  Changed to static standing balance training standing with feet together and UE support on two aqua dumbbells only.  Cued pt to maintain narrow BOS while performing bilat UE movement simultaneous with horizontal ABD/ADD and then with alternating UE movement: pushing/pulling.  Added in trunk movement with UE and upper trunk rotations to L and R.   Performed 12 reps each movement with PT providing min-mod A to maintain balance and to maintain narrow BOS.  Performed with EO.  Changed to dynamic standing balance with forward and lateral step ups on submerged step with UE support on aqua dumbbells.  Performed 10 reps forward step ups with each LE, and 10 reps alternating lateral step ups with PT providing min-mod A for balance and cues for full anterior weight shift over stance LE prior to pushing up.    Pt requires buoyancy of water for partial body weight support, to reduce fall risk with gait training and balance exercises with minimal UE support; exercises able to be performed safely in water without the risk of fall compared to those same exercises performed on land;  viscosity of water needed for resistance for strengthening and for increased proprioception to L side.  Current of water provides perturbations for challenging static & dynamic standing balance.    PT Short Term Goals - 12/07/21 1143       PT SHORT TERM GOAL #1   Title Pt will perform basic transfers wheelchair to/from mat using squat pivot transfer with supervision.  Upgrade - Perform basic transfers modified independently. (Target date all STG's 12-14-21)    Baseline performed with supervision in previous session    Time 4    Period Weeks    Status Achieved    Target Date 12/14/21      PT SHORT TERM GOAL #2   Title Pt will improve Berg score from 27/56 to >/= 32/56 to demo improved balance with reduced fall risk.  Upgraded goal - increase score to >/= 40/56    Baseline 27/56; 35/56 11-15-21    Time 4    Period Weeks    Status New    Target Date 12/14/21      PT SHORT TERM GOAL #3   Title Improve TUG score from 1".07 secs to </= 50 secs with RW with CGA.;  Upgraded goal - improve score from 31.75 secs to </= 25 secs with RW    Baseline 1" 7 secs with RW; 31.75 seconds on 11/13/21    Time 4    Period Weeks    Status New    Target Date 12/14/21      PT SHORT TERM  GOAL #4   Title Perform sit to stand from mat to RW with SBA with normal BOS. Upgraded goal - perform sit to stand 5 times without UE support without LOB with SBA    Baseline met with using BUE support    Time 4    Period Weeks    Status New    Target Date 12/14/21      PT SHORT TERM GOAL #5   Title Amb. 48' with RW with CGA on flat, even surface. Upgraded goal 51' with RW with SBA on flat, even surface    Baseline 230' with CGA with RW on 11/13/21  Time 4    Period Weeks    Status New    Target Date 12/14/21      PT SHORT TERM GOAL #6   Title Perform HEP for standing balance with wife's assistance.    Baseline new balance exercises issued on 11-15-21    Time 4    Period Weeks    Status On-going    Target Date 12/14/21               PT Long Term Goals - 12/07/21 1143       PT LONG TERM GOAL #1   Title Pt will be modified independent with household amb. without use of assistive device.    Time 12    Period Weeks    Status New    Target Date 01/11/22      PT LONG TERM GOAL #2   Title Pt will amb. 1000' with SPC with SBA on flat even and uneven surfaces for increased community accessibility.    Time 12    Period Weeks    Status New    Target Date 01/11/22      PT LONG TERM GOAL #3   Title Improve TUG score to </= 25 secs with appropriate assistive device to demo improved functional mobility and to reduce fall risk.    Baseline 1".07 secs with RW - 10-16-21    Time 12    Period Weeks    Status New    Target Date 01/11/22      PT LONG TERM GOAL #4   Title Increase Berg score to >/= 45/56 to reduce fall risk.    Baseline 27/56    Time 12    Period Weeks    Status New    Target Date 01/11/22      PT LONG TERM GOAL #5   Title Increase gait velocity from .43 ft/sec to >/= 1.6 ft/sec with RW for increased gait efficiency.    Time 12    Period Weeks    Status New    Target Date 01/11/22      PT LONG TERM GOAL #6   Title Independent in updated HEP as  appropriate.    Time 12    Period Weeks    Status New    Target Date 01/11/22                   Plan - 12/12/21 1417     Clinical Impression Statement Aquatic environment utilized to perform gait training with decreased UE support and stabilization, coordination training, and static and dynamic standing balance training.  Pt required intermittent cues to use vision to monitor positioning of L foot.  Pt able to progress from using water walker > aqua bells for UE support when performing gait in same session.  Will continue to address and progress towards LTG.    Personal Factors and Comorbidities Comorbidity 2;Profession    Comorbidities basilar artery stenosis, dyslipidemia,  HTN, neurogenic bladder, diplopia, anemia, Lt iliac artery stenosis, PAD    Examination-Activity Limitations Bathing;Carry;Continence;Toileting;Dressing;Lift;Stand;Stairs;Squat;Reach Overhead;Locomotion Level;Transfers    Examination-Participation Restrictions Cleaning;Community Activity;Driving;Interpersonal Relationship;Laundry;Shop;Occupation;Yard Work;Meal Prep    Stability/Clinical Decision Making Evolving/Moderate complexity    Rehab Potential Good    PT Frequency 2x / week    PT Duration 12 weeks    PT Treatment/Interventions ADLs/Self Care Home Management;Aquatic Therapy;DME Instruction;Gait training;Stair training;Therapeutic activities;Therapeutic exercise;Balance training;Neuromuscular re-education;Patient/family education;Vestibular    PT Next Visit Plan CHECK STG.  Letta Moynahan - Aquatic therapy - please  work on Personnel officer and balance - both static and dynamic, coordination exercises, core stabilization.  Supine UE/LE swim movements.  Pt's RLE is slightly more impaired than LLE (esp. decreased SLS on RLE compared to LLE);  pt has decreased hearing in Rt ear; LAND =  Cont with gait with RW, trunk/core stabilization exercises and standing balance exs. seated balance on disc.    PT Home Exercise Plan [id number]     Consulted and Agree with Plan of Care Patient;Family member/caregiver    Family Member Consulted wife, [name]             Patient will benefit from skilled therapeutic intervention in order to improve the following deficits and impairments:  Abnormal gait, Decreased activity tolerance, Decreased balance, Decreased coordination, Dizziness, Impaired sensation, Impaired UE functional use, Impaired vision/preception  Visit Diagnosis: Other symptoms and signs involving the nervous system  Other lack of coordination  Unsteadiness on feet  Other abnormalities of gait and mobility  Ataxic gait     Problem List Patient Active Problem List   Diagnosis Date Noted   Familial hypercholesteremia 11/06/2021   PFO (patent foramen ovale) 11/06/2021   Urinary retention 09/27/2021   Diplopia 09/27/2021   Carotid stenosis, right 09/27/2021   Anemia 09/27/2021   Slow transit constipation    Benign essential HTN    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 [place], [place], [address] Phone: [phone]   Fax:  [phone]  Name: Maurice Little MRN: [id number] Date of Birth: 01-31-1956

## 2021-12-13 ENCOUNTER — Other Ambulatory Visit: Payer: Self-pay

## 2021-12-13 ENCOUNTER — Ambulatory Visit: Payer: 59 | Admitting: Occupational Therapy

## 2021-12-13 ENCOUNTER — Ambulatory Visit: Payer: 59 | Admitting: Physical Therapy

## 2021-12-13 ENCOUNTER — Encounter: Payer: Self-pay | Admitting: Occupational Therapy

## 2021-12-13 DIAGNOSIS — R208 Other disturbances of skin sensation: Secondary | ICD-10-CM

## 2021-12-13 DIAGNOSIS — R2681 Unsteadiness on feet: Secondary | ICD-10-CM

## 2021-12-13 DIAGNOSIS — R2689 Other abnormalities of gait and mobility: Secondary | ICD-10-CM | POA: Diagnosis not present

## 2021-12-13 DIAGNOSIS — R29818 Other symptoms and signs involving the nervous system: Secondary | ICD-10-CM

## 2021-12-13 DIAGNOSIS — R41842 Visuospatial deficit: Secondary | ICD-10-CM

## 2021-12-13 DIAGNOSIS — R278 Other lack of coordination: Secondary | ICD-10-CM

## 2021-12-13 DIAGNOSIS — R270 Ataxia, unspecified: Secondary | ICD-10-CM

## 2021-12-13 NOTE — Therapy (Signed)
[facility] [address] [place], [place], [address] Phone: [phone]   Fax:  [phone]  Occupational Therapy Treatment  Patient Details  Name: Maurice Little MRN: [id number] Date of Birth: [birth year] Referring Provider (OT): Lauraine Rinne, PA-C   Encounter Date: 12/13/2021   OT End of Session - 12/13/21 0852     Visit Number 20    Number of Visits 25    Date for OT Re-Evaluation 12/31/21    Authorization Type UHC--no visit limit/no auth, copay per day    OT Start Time 0849    OT Stop Time 0930    OT Time Calculation (min) 41 min    Activity Tolerance Patient tolerated treatment well    Behavior During Therapy [facility] for tasks assessed/performed             Past Medical History:  Diagnosis Date   Benign prostatic hyperplasia with elevated prostate specific antigen (PSA)    Hypertension    Iliac artery stenosis, left (HCC)    with claudication resolved with stent   PAD (peripheral artery disease) ([place])    Stroke [facility])     Past Surgical History:  Procedure Laterality Date   BUBBLE STUDY  08/31/2021   Procedure: BUBBLE STUDY;  Surgeon: Skeet Latch, MD;  Location: [facility];  Service: Cardiovascular;;   ILIAC ARTERY STENT Left    IR ANGIO VERTEBRAL SEL SUBCLAVIAN INNOMINATE UNI L MOD SED  08/30/2021   IR ANGIO VERTEBRAL SEL SUBCLAVIAN INNOMINATE UNI R MOD SED  08/30/2021   IR CT HEAD LTD  08/30/2021   IR INTRA CRAN STENT  08/30/2021   IR RADIOLOGIST EVAL & MGMT  11/28/2021   IR US GUIDE VASC ACCESS RIGHT  08/30/2021   RADIOLOGY WITH ANESTHESIA N/A 08/30/2021   Procedure: IR WITH ANESTHESIA;  Surgeon: Luanne Bras, MD;  Location: [facility];  Service: Radiology;  Laterality: N/A;   TEE WITHOUT CARDIOVERSION N/A 08/31/2021   Procedure: TRANSESOPHAGEAL ECHOCARDIOGRAM (TEE);  Surgeon: Skeet Latch, MD;  Location: [facility];  Service: Cardiovascular;  Laterality: N/A;    There were no vitals  filed for this visit.   Subjective Assessment - 12/13/21 0851     Subjective  The shoulder at night is painful at night (1/10).  I catch myself not using proper technique with the shoulder.    Pertinent History CVA (R cereballar and R small R brachium potis and R pontomedullary junction).   PMH:  dyslipidemia, HTN, carotid stenosis, anemia, PAD    Limitations ataxia, diplopia, folley catheter, decr hearing (particularly R side)    Patient Stated Goals incr independence, get better    Currently in Pain? Yes    Pain Score 1     Pain Location Shoulder    Pain Orientation Right    Pain Descriptors / Indicators Sore    Pain Type Acute pain    Pain Onset 1 to 4 weeks ago    Pain Frequency Intermittent    Aggravating Factors  quick movements    Pain Relieving Factors rest, reporsitioning               In supine, closed-chain shoulder flex and chest press with BUEs with ball with min v.c. for slowing down for incr control and R shoulder compensation.    Prone, scapular exercises for retraction with BUEs with shoulders in ext x20.  Then wt. Bearing on elbows with head/chest lift x15 for incr scapular stability.  Quadruped, forward/backward wt. Shifts with min cueing/facilitation  for incr scapular depression and stability.  Sitting, stacking coins (stacks of 10) with R hand with min cueing for shoulder hike and min difficulty with coordination, then manipulating in hand to place in coin bank 1 at a time with min cueing.  Then picking up coins and placing in container with each finger/thumb for incr coordination with min difficulty.        OT Short Term Goals - 12/04/21 0854       OT SHORT TERM GOAL #1   Title Pt will be independent with initial HEP.--check STGs 11/02/21    Time 4    Period Weeks    Status Achieved      OT SHORT TERM GOAL #2   Title Pt will perform BADLs mod I except supervision for shower transfer.    Time 4    Period Weeks    Status Achieved   min A for  back, supervision for standing to pull up pants.  11/20/21:  supervision for ambulation for toileting (mod I for tolieting at night with [facility]), supervision for shower transfer.  12/04/21:  now ambulating to restroom mod I     OT SHORT TERM GOAL #3   Title Pt will perform tabletop visual scanning with 90% accuracy and no reports of diplopia.    Time 4    Period Weeks    Status Achieved      OT SHORT TERM GOAL #4   Title Pt will verbalize understanding of visual compensation strategies for ADLs/IADLs prn (including ?taping, head turns/positioning)    Time 4    Period Weeks    Status Achieved   Pt/ wife verbalize understanding     OT SHORT TERM GOAL #5   Title Pt will improve coordination for ADLs as shown by improving time on 9-hole peg test by at least 5sec bilaterally.    Baseline R-66.85sec, L-41.25sec    Time 4    Period Weeks    Status Achieved   R 65.25-not met yet improvedd, L 33.60- met partially met, met for LUE.  11/20/21:  R-51.75sec              OT Long Term Goals - 12/13/21 0857       OT LONG TERM GOAL #1   Title Pt will be independent with updated HEP.    Time 12    Period Weeks    Status New      OT LONG TERM GOAL #2   Title Pt will perform simple snack prep/home maintenance task with supervision.--11/29/21 met initial goal, updated 11/29/21:  Pt will peform simple cooking and home maintenance tasks mod I.    Time 12    Period Weeks    Status Revised      OT LONG TERM GOAL #3   Title Pt will perform environmental scanning/navigation with at least 90% accuracy for incr safety in the home/community.    Time 12    Period Weeks    Status New      OT LONG TERM GOAL #4   Title Pt will improve RUE coordination as shown by improving time on 9-hole peg test by at least 15sec.--met.  11/27/21 upgraded:  Pt will complete 9-hole peg test in 45sec or less with RUE.    Baseline 66.85sec    Time 12    Period Weeks    Status Revised      OT LONG TERM GOAL #5   Title Pt  will be able to retrieve/replace 1-2  objects on overhead shelf with each UE x10 safetly and with good control for IADL tasks.    Time 12    Period Weeks    Status New      OT LONG TERM GOAL #6   Title Pt will be able to type with at least 80% accuracy with at least 20wpm in prep for work tasks.    Baseline 39% with 10wpm    Time 12    Period Weeks    Status Deferred   pt now not planning to go back to work     OT [place] #7   Title Pt will be able to write at least 4 sentences with 100% legibility in reasonable amount of time.    Time 12    Period Weeks    Status On-going   11/29/21:  approx 90% legibility                  Plan - 12/13/21 0853     Clinical Impression Statement Pt continues to make good progress with improving balance and coordination.    OT Occupational Profile and History Detailed Assessment- Review of Records and additional review of physical, cognitive, psychosocial history related to current functional performance    Occupational performance deficits (Please refer to evaluation for details): ADL's;IADL's;Work;Leisure    Body Structure / Function / Physical Skills ADL;UE functional use;Balance;FMC;Coordination;Sensation;IADL;Decreased knowledge of use of DME;Dexterity;Vision    Rehab Potential Good    Clinical Decision Making Several treatment options, min-mod task modification necessary    Comorbidities Affecting Occupational Performance: May have comorbidities impacting occupational performance    Modification or Assistance to Complete Evaluation  Min-Moderate modification of tasks or assist with assess necessary to complete eval    OT Frequency 2x / week    OT Duration 12 weeks   +eval   OT Treatment/Interventions Self-care/ADL training;Energy conservation;Ultrasound;Visual/perceptual remediation/compensation;Patient/family education;DME and/or AE instruction;Aquatic Therapy;Paraffin;Passive range of motion;Balance training;Cryotherapy;Functional  Mobility Training;Moist Heat;Therapeutic exercise;Manual Therapy;Therapeutic activities;Neuromuscular education    Plan IADL tasks, functional mobility/balance; montior R shoulder pain prn, environemental scanning    OT Home Exercise Plan Pt reports that he has some coordination activities and visual HEP from hospital    Consulted and Agree with Plan of Care Patient;Family member/caregiver    Family Member Consulted wife             Patient will benefit from skilled therapeutic intervention in order to improve the following deficits and impairments:   Body Structure / Function / Physical Skills: ADL, UE functional use, Balance, FMC, Coordination, Sensation, IADL, Decreased knowledge of use of DME, Dexterity, Vision       Visit Diagnosis: Other symptoms and signs involving the nervous system  Other lack of coordination  Unsteadiness on feet  Visuospatial deficit  Other disturbances of skin sensation  Other abnormalities of gait and mobility  Ataxia    Problem List Patient Active Problem List   Diagnosis Date Noted   Familial hypercholesteremia 11/06/2021   PFO (patent foramen ovale) 11/06/2021   Urinary retention 09/27/2021   Diplopia 09/27/2021   Carotid stenosis, right 09/27/2021   Anemia 09/27/2021   Slow transit constipation    Benign essential HTN    Cerebellar stroke ([place]) 09/04/2021   Acute ischemic stroke (HCC)    Cerebral embolism with cerebral infarction 08/30/2021   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 [place], [place], [address] Phone: [phone]   Fax:  [phone]  Name: Maurice Little MRN: [id number] Date of Birth: 1956/09/12   Vianne Bulls, OTR/L [facility] [address] [place], [place]   [address] [phone] phone [phone] 12/13/21 9:27 AM

## 2021-12-14 NOTE — Therapy (Signed)
[facility] [address] [place], [place], [address] Phone: [phone]   Fax:  [phone]  Physical Therapy Treatment  Patient Details  Name: Maurice Little MRN: [id number] Date of Birth: 07-30-1956 Referring Provider (PT): Lauraine Rinne, PA-C   Encounter Date: 12/13/2021   PT End of Session - 12/14/21 0904     Visit Number 16    Number of Visits 25    Date for PT Re-Evaluation 01/11/22    Authorization Type UHC ([place] with no auth required)    PT Start Time 0935    PT Stop Time 1017    PT Time Calculation (min) 42 min    Equipment Utilized During Treatment Gait belt    Activity Tolerance Patient tolerated treatment well    Behavior During Therapy W[facility] for tasks assessed/performed             Past Medical History:  Diagnosis Date   Benign prostatic hyperplasia with elevated prostate specific antigen (PSA)    Hypertension    Iliac artery stenosis, left ([place])    with claudication resolved with stent   PAD (peripheral artery disease) ([place])    Stroke [facility])     Past Surgical History:  Procedure Laterality Date   BUBBLE STUDY  08/31/2021   Procedure: BUBBLE STUDY;  Surgeon: Skeet Latch, MD;  Location: [facility];  Service: Cardiovascular;;   ILIAC ARTERY STENT Left    IR ANGIO VERTEBRAL SEL SUBCLAVIAN INNOMINATE UNI L MOD SED  08/30/2021   IR ANGIO VERTEBRAL SEL SUBCLAVIAN INNOMINATE UNI R MOD SED  08/30/2021   IR CT HEAD LTD  08/30/2021   IR INTRA CRAN STENT  08/30/2021   IR RADIOLOGIST EVAL & MGMT  11/28/2021   IR US GUIDE VASC ACCESS RIGHT  08/30/2021   RADIOLOGY WITH ANESTHESIA N/A 08/30/2021   Procedure: IR WITH ANESTHESIA;  Surgeon: Luanne Bras, MD;  Location: [facility];  Service: Radiology;  Laterality: N/A;   TEE WITHOUT CARDIOVERSION N/A 08/31/2021   Procedure: TRANSESOPHAGEAL ECHOCARDIOGRAM (TEE);  Surgeon: Skeet Latch, MD;  Location: [facility];  Service: Cardiovascular;  Laterality:  N/A;    There were no vitals filed for this visit.   Subjective Assessment - 12/13/21 0935     Subjective Pt states he loved the pool on Tuesday - would like to go every week; states he did some cooking at home and did fine    Patient is accompained by: Family member   Maurice Little (wife)   Pertinent History Basilar artery stenosis, urinary retention with indwelling catheter, neurogenic bladder, diplopia, Rt cerebellar infarct, HTN, Rt carotid stenosis, anemia, s/p cerebral angiogram with stent    Limitations Lifting;Standing;Walking;House hold activities;Reading    Patient Stated Goals "I want to be normal again - walk without walker", decrease numbness on Rt side of body    Currently in Pain? Yes    Pain Score 1     Pain Location Shoulder    Pain Orientation Right    Pain Descriptors / Indicators Discomfort;Sore    Pain Type Acute pain    Pain Onset 1 to 4 weeks ago    Pain Frequency Constant    Aggravating Factors  Raising Rt arm aggravates the shoulder pain    Pain Relieving Factors keeping it lower helps                [facility] PT Assessment - 12/14/21 0001       Berg Balance Test   Sit to Stand Able to  stand  independently using hands    Standing Unsupported Able to stand 2 minutes with supervision    Sitting with Back Unsupported but Feet Supported on Floor or Stool Able to sit safely and securely 2 minutes    Stand to Sit Controls descent by using hands    Transfers Able to transfer safely, definite need of hands    Standing Unsupported with Eyes Closed Able to stand 10 seconds with supervision    Standing Unsupported with Feet Together Needs help to attain position but able to stand for 30 seconds with feet together   held 30 secs   From Standing, Reach Forward with Outstretched Arm Can reach confidently >25 cm (10")    From Standing Position, Pick up Object from Floor Able to pick up shoe, needs supervision   able to pick up object   From Standing Position, Turn to Look  Behind Over each Shoulder Turn sideways only but maintains balance    Turn 360 Degrees Needs close supervision or verbal cueing    Standing Unsupported, Alternately Place Feet on Step/Stool Able to complete >2 steps/needs minimal assist    Standing Unsupported, One Foot in Front Able to take small step independently and hold 30 seconds    Standing on One Leg Able to lift leg independently and hold equal to or more than 3 seconds   6 secs on LLE   Total Score 35                           [facility] Adult PT Treatment/Exercise - 12/14/21 0001       Transfers   Transfers Sit to Stand;Stand to Sit    Sit to Stand 5: Supervision   close SBA   Stand to Sit 5: Supervision    Number of Reps Other reps (comment)   5 reps for STG assessment   Comments from mat table in treatment room      Ambulation/Gait   Ambulation/Gait Yes    Ambulation/Gait Assistance 5: Supervision;4: Min guard   SBA to CGA   Ambulation Distance (Feet) 350 Feet    Assistive device Rolling walker    Gait Pattern Step-through pattern;Ataxic;Decreased weight shift to right;Decreased weight shift to left;Wide base of support    Ambulation Surface Level;Indoor    Gait Comments 115' without device with CGA to min assist after 90' amb. due to some mild LOB      Standardized Balance Assessment   Standardized Balance Assessment Timed Up and Go Test      Timed Up and Go Test   TUG Normal TUG    Normal TUG (seconds) 33.31   with RW                    PT Education - 12/14/21 0913     Education Details verbally reviewed HEP including strenghening exs with red theraband and balance exs - pt reports not doing balance exercises on a regular basis; recommended to pt that some balance exs in HEP be performed daily - at least 3 exercises - if not all of them performed; pt describes doing arm exs more regularly (possibly being able to do in seated position independently)    Person(s) Educated Patient    Methods  Explanation    Comprehension Verbalized understanding              PT Short Term Goals - 12/13/21 2993  PT SHORT TERM GOAL #1   Title Pt will perform basic transfers wheelchair to/from mat using squat pivot transfer with supervision.  Upgrade - Perform basic transfers modified independently. (Target date all STG's 12-14-21)    Baseline performed with supervision in previous session; met 12-13-21    Time 4    Period Weeks    Status Achieved    Target Date 12/14/21      PT SHORT TERM GOAL #2   Title Pt will improve Berg score from 27/56 to >/= 32/56 to demo improved balance with reduced fall risk.  Upgraded goal - increase score to >/= 40/56    Baseline 27/56; 35/56 11-15-21     score 35/56 on 12-13-21    Time 4    Period Weeks    Status Not Met    Target Date 12/14/21      PT SHORT TERM GOAL #3   Title Improve TUG score from 1".07 secs to </= 50 secs with RW with CGA.;  Upgraded goal - improve score from 31.75 secs to </= 25 secs with RW    Baseline 1" 7 secs with RW; 31.75 seconds on 11/13/21;  33.31 with RW on 12-13-21    Time 4    Period Weeks    Status Not Met    Target Date 12/14/21      PT SHORT TERM GOAL #4   Title Perform sit to stand from mat to RW with SBA with normal BOS. Upgraded goal - perform sit to stand 5 times without UE support without LOB with SBA    Baseline met with using BUE support; met 12-13-21    Time 4    Period Weeks    Status Achieved    Target Date 12/14/21      PT SHORT TERM GOAL #5   Title Amb. 51' with RW with CGA on flat, even surface. Upgraded goal 62' with RW with SBA on flat, even surface    Baseline 230' with CGA with RW on 11/13/21;  350' with RW with SBA on 12-13-21    Time 4    Period Weeks    Status Achieved    Target Date 12/14/21      PT SHORT TERM GOAL #6   Title Perform HEP for standing balance with wife's assistance.    Baseline new balance exercises issued on 11-15-21; pt reports not doing balance exercises regularly -  12-13-21    Time 4    Period Weeks    Status On-going    Target Date 12/14/21               PT Long Term Goals - 12/14/21 0908       PT LONG TERM GOAL #1   Title Pt will be modified independent with household amb. without use of assistive device.    Time 12    Period Weeks    Status New    Target Date 01/11/22      PT LONG TERM GOAL #2   Title Pt will amb. 1000' with SPC with SBA on flat even and uneven surfaces for increased community accessibility.    Time 12    Period Weeks    Status New    Target Date 01/11/22      PT LONG TERM GOAL #3   Title Improve TUG score to </= 25 secs with appropriate assistive device to demo improved functional mobility and to reduce fall risk.    Baseline 1".07 secs with  RW - 10-16-21    Time 12    Period Weeks    Status New    Target Date 01/11/22      PT LONG TERM GOAL #4   Title Increase Berg score to >/= 45/56 to reduce fall risk.    Baseline 27/56    Time 12    Period Weeks    Status New    Target Date 01/11/22      PT LONG TERM GOAL #5   Title Increase gait velocity from .43 ft/sec to >/= 1.6 ft/sec with RW for increased gait efficiency.    Time 12    Period Weeks    Status New    Target Date 01/11/22      PT LONG TERM GOAL #6   Title Independent in updated HEP as appropriate.    Time 12    Period Weeks    Status New    Target Date 01/11/22                   Plan - 12/14/21 0915     Clinical Impression Statement PT session focused on assessment of 8 week STG's;  pt has met STG's #1, 4 and 5:  pt able to perform sit to stand transfers 5x without UE support from mat table with SBA (continued to be performed slowly & deliberately);  pt able to perform wheelchair to/ from mat transfers with supervision only and is performing sit to stand transfers to RW at home independently.  Pt able to amb. 350' with RW with SBA, demonstrating improved endurance/activity tolerance.  STG #3 not met as  TUG score is 33.31 secs  with RW (2 secs slower than that when assessed on 11-13-21);  STG #2 not met as Berg score remains 35/56 - same as when tested on 11-13-21.  HEP is ongoing as pt reports not doing balance exercises on regular basis - is walking at home for exercise, but balance HEP was strongly recommended to be performed daily and regularly.  Cont with POC - work toward LTG's.    Personal Factors and Comorbidities Comorbidity 2;Profession    Comorbidities basilar artery stenosis, dyslipidemia,  HTN, neurogenic bladder, diplopia, anemia, Lt iliac artery stenosis, PAD    Examination-Activity Limitations Bathing;Carry;Continence;Toileting;Dressing;Lift;Stand;Stairs;Squat;Reach Overhead;Locomotion Level;Transfers    Examination-Participation Restrictions Cleaning;Community Activity;Driving;Interpersonal Relationship;Laundry;Shop;Occupation;Yard Work;Meal Prep    Stability/Clinical Decision Making Evolving/Moderate complexity    Rehab Potential Good    PT Frequency 2x / week    PT Duration 12 weeks    PT Treatment/Interventions ADLs/Self Care Home Management;Aquatic Therapy;DME Instruction;Gait training;Stair training;Therapeutic activities;Therapeutic exercise;Balance training;Neuromuscular re-education;Patient/family education;Vestibular    PT Next Visit Plan LAND =  Cont with gait without RW, trunk/core stabilization exercises and standing balance exs., coordination exercises esp for RLE    PT Home Exercise Plan [id number]    Consulted and Agree with Plan of Care Patient;Family member/caregiver    Family Member Consulted wife, Maurice Little             Patient will benefit from skilled therapeutic intervention in order to improve the following deficits and impairments:  Abnormal gait, Decreased activity tolerance, Decreased balance, Decreased coordination, Dizziness, Impaired sensation, Impaired UE functional use, Impaired vision/preception  Visit Diagnosis: Other abnormalities of gait and mobility  Unsteadiness on  feet     Problem List Patient Active Problem List   Diagnosis Date Noted   Familial hypercholesteremia 11/06/2021   PFO (patent foramen ovale) 11/06/2021   Urinary retention 09/27/2021   Diplopia  09/27/2021   Carotid stenosis, right 09/27/2021   Anemia 09/27/2021   Slow transit constipation    Benign essential HTN    Cerebellar stroke ([place]) 09/04/2021   Acute ischemic stroke [facility])    Cerebral embolism with cerebral infarction 08/30/2021   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 [place], [place], [address] Phone: [phone]   Fax:  [phone]  Name: Maurice Little MRN: [id number] Date of Birth: July 03, 1956

## 2021-12-16 ENCOUNTER — Other Ambulatory Visit: Payer: Self-pay | Admitting: Physical Medicine & Rehabilitation

## 2021-12-18 ENCOUNTER — Encounter: Payer: Self-pay | Admitting: Occupational Therapy

## 2021-12-18 ENCOUNTER — Encounter: Payer: Self-pay | Admitting: Physical Therapy

## 2021-12-18 ENCOUNTER — Ambulatory Visit: Payer: 59 | Admitting: Occupational Therapy

## 2021-12-18 ENCOUNTER — Ambulatory Visit: Payer: 59 | Admitting: Physical Therapy

## 2021-12-18 ENCOUNTER — Other Ambulatory Visit (HOSPITAL_COMMUNITY): Payer: Self-pay | Admitting: Interventional Radiology

## 2021-12-18 ENCOUNTER — Other Ambulatory Visit: Payer: Self-pay

## 2021-12-18 DIAGNOSIS — R2689 Other abnormalities of gait and mobility: Secondary | ICD-10-CM | POA: Diagnosis not present

## 2021-12-18 DIAGNOSIS — R2681 Unsteadiness on feet: Secondary | ICD-10-CM

## 2021-12-18 DIAGNOSIS — R29818 Other symptoms and signs involving the nervous system: Secondary | ICD-10-CM

## 2021-12-18 DIAGNOSIS — R278 Other lack of coordination: Secondary | ICD-10-CM

## 2021-12-18 DIAGNOSIS — R208 Other disturbances of skin sensation: Secondary | ICD-10-CM

## 2021-12-18 DIAGNOSIS — R270 Ataxia, unspecified: Secondary | ICD-10-CM

## 2021-12-18 DIAGNOSIS — I771 Stricture of artery: Secondary | ICD-10-CM

## 2021-12-18 DIAGNOSIS — R41842 Visuospatial deficit: Secondary | ICD-10-CM

## 2021-12-18 NOTE — Therapy (Signed)
[facility] °[address] °[place], [place], [address] °Phone: [phone]   Fax:  [phone] ° °Physical Therapy Treatment ° °Patient Details  °Name: Maurice Little °MRN: [id number] °Date of Birth: 11/03/1955 °Referring Provider (PT): Daniel Angiulli, PA-C ° ° °Encounter Date: 12/18/2021 ° ° PT End of Session - 12/18/21 1150   ° ° Visit Number 17   ° Number of Visits 25   ° Date for PT Re-Evaluation 01/11/22   ° Authorization Type UHC ([place] with no auth required)   ° PT Start Time 1148   ° PT Stop Time 1228   ° PT Time Calculation (min) 40 min   ° Equipment Utilized During Treatment Gait belt   ° Activity Tolerance Patient tolerated treatment well   ° Behavior During Therapy WFL for tasks assessed/performed   ° °  °  ° °  ° ° °Past Medical History:  °Diagnosis Date  ° Benign prostatic hyperplasia with elevated prostate specific antigen (PSA)   ° Hypertension   ° Iliac artery stenosis, left (HCC)   ° with claudication resolved with stent  ° PAD (peripheral artery disease) (HCC)   ° Stroke (HCC)   ° ° °Past Surgical History:  °Procedure Laterality Date  ° BUBBLE STUDY  08/31/2021  ° Procedure: BUBBLE STUDY;  Surgeon: Wallowa, Tiffany, MD;  [facility];  Service: Cardiovascular;;  ° ILIAC ARTERY STENT Left   ° IR ANGIO VERTEBRAL SEL SUBCLAVIAN INNOMINATE UNI L MOD SED  08/30/2021  ° IR ANGIO VERTEBRAL SEL SUBCLAVIAN INNOMINATE UNI R MOD SED  08/30/2021  ° IR CT HEAD LTD  08/30/2021  ° IR INTRA CRAN STENT  08/30/2021  ° IR RADIOLOGIST EVAL & MGMT  11/28/2021  ° IR US GUIDE VASC ACCESS RIGHT  08/30/2021  ° RADIOLOGY WITH ANESTHESIA N/A 08/30/2021  ° Procedure: IR WITH ANESTHESIA;  Surgeon: Deveshwar, Sanjeev, MD;  [facility];  Service: Radiology;  Laterality: N/A;  ° TEE WITHOUT CARDIOVERSION N/A 08/31/2021  ° Procedure: TRANSESOPHAGEAL ECHOCARDIOGRAM (TEE);  Surgeon: , Tiffany, MD;  [facility];  Service: Cardiovascular;  Laterality:  N/A;  ° ° °There were no vitals filed for this visit. ° ° Subjective Assessment - 12/18/21 1151   ° ° Subjective Aquatics is going well. Not as vigilant as he should be with the walking at the countertop at home or the band exercises. No falls.   ° Patient is accompained by: Family member   Jodi (wife)  ° Pertinent History Basilar artery stenosis, urinary retention with indwelling catheter, neurogenic bladder, diplopia, Rt cerebellar infarct, HTN, Rt carotid stenosis, anemia, s/p cerebral angiogram with stent   ° Limitations Lifting;Standing;Walking;House hold activities;Reading   ° Patient Stated Goals "I want to be normal again - walk without walker", decrease numbness on Rt side of body   ° Currently in Pain? Yes   ° Pain Score 3    ° Pain Location Shoulder   ° Pain Orientation Right   ° Pain Descriptors / Indicators Aching   ° Pain Type Acute pain   ° Pain Onset 1 to 4 weeks ago   ° Aggravating Factors  Using it   ° Pain Relieving Factors Staying still   ° °  °  ° °  ° ° ° ° ° ° ° ° ° ° ° ° ° ° ° ° ° ° ° ° [facility] Adult PT Treatment/Exercise - 12/18/21 1224   ° °  ° Transfers  ° Transfers Sit to Stand;Stand   to Sit   ° Sit to Stand 4: Min guard   ° Stand to Sit 4: Min guard   ° Stand Pivot Transfers 4: Min guard;Other (comment)   from mat table > w/c with no AD  ° Comments from mat table with no UE support x5 reps, initial cues for foot placement and incr forward lean and eccentric control for lowering back to mat. Performed an additional x7 reps with 2nd PT behind pt giving resistance at pelvis with green tband for core activation, strengthening, and working on postural stability. Min guard from other therapist. Plus additional reps of sit <> stands performed on blue mat with no UE support during session.   °  ° Ambulation/Gait  ° Ambulation/Gait Yes   ° Ambulation/Gait Assistance 4: Min assist;4: Min guard   ° Ambulation/Gait Assistance Details On level surfaces with no AD with 1-2 therapists. Min guard/min A for  balance with cues to relax BUE and slow down pace.   ° Ambulation Distance (Feet) 115 Feet   x1  ° Assistive device None   ° Gait Pattern Step-through pattern;Ataxic;Decreased weight shift to right;Decreased weight shift to left;Wide base of support   ° Ambulation Surface Level;Indoor   °  ° Neuro Re-ed   ° Neuro Re-ed Details  At edge of mat table with chair in front: Reaching outside of BOS (performed x7 reps to both R and L) to grab bean bag and then toss into crate. One therapist providing min guard and 2nd therapist holding bean bag. Performed same activity again with pt standing on blue mat for incr ankle strategy for balance.   ° °  °  ° °  ° ° ° ° ° ° Balance Exercises - 12/18/21 1224   ° °  ° Balance Exercises: Standing  ° Stepping Strategy Lateral;Limitations   ° Stepping Strategy Limitations Standing on blue mat alternating lateral steps to floor dot as target x10 reps each leg, close min guard/min A for balance. Pt with incr difficulty with stepping RLE back to midline.   ° °  °  ° °  ° ° ° ° ° ° ° PT Short Term Goals - 12/13/21 0938   ° °  ° PT SHORT TERM GOAL #1  ° Title Pt will perform basic transfers wheelchair to/from mat using squat pivot transfer with supervision.  Upgrade - Perform basic transfers modified independently. (Target date all STG's 12-14-21)   ° Baseline performed with supervision in previous session; met 12-13-21   ° Time 4   ° Period Weeks   ° Status Achieved   ° Target Date 12/14/21   °  ° PT SHORT TERM GOAL #2  ° Title Pt will improve Berg score from 27/56 to >/= 32/56 to demo improved balance with reduced fall risk.  Upgraded goal - increase score to >/= 40/56   ° Baseline 27/56; 35/56 11-15-21     score 35/56 on 12-13-21   ° Time 4   ° Period Weeks   ° Status Not Met   ° Target Date 12/14/21   °  ° PT SHORT TERM GOAL #3  ° Title Improve TUG score from 1".07 secs to </= 50 secs with RW with CGA.;  Upgraded goal - improve score from 31.75 secs to </= 25 secs with RW   ° Baseline 1" 7  secs with RW; 31.75 seconds on 11/13/21;  33.31 with RW on 12-13-21   ° Time 4   ° Period Weeks   °   Status Not Met   ° Target Date 12/14/21   °  ° PT SHORT TERM GOAL #4  ° Title Perform sit to stand from mat to RW with SBA with normal BOS. Upgraded goal - perform sit to stand 5 times without UE support without LOB with SBA   ° Baseline met with using BUE support; met 12-13-21   ° Time 4   ° Period Weeks   ° Status Achieved   ° Target Date 12/14/21   °  ° PT SHORT TERM GOAL #5  ° Title Amb. 115' with RW with CGA on flat, even surface. Upgraded goal 350' with RW with SBA on flat, even surface   ° Baseline 230' with CGA with RW on 11/13/21;  350' with RW with SBA on 12-13-21   ° Time 4   ° Period Weeks   ° Status Achieved   ° Target Date 12/14/21   °  ° PT SHORT TERM GOAL #6  ° Title Perform HEP for standing balance with wife's assistance.   ° Baseline new balance exercises issued on 11-15-21; pt reports not doing balance exercises regularly - 12-13-21   ° Time 4   ° Period Weeks   ° Status On-going   ° Target Date 12/14/21   ° °  °  ° °  ° ° ° ° PT Long Term Goals - 12/14/21 0908   ° °  ° PT LONG TERM GOAL #1  ° Title Pt will be modified independent with household amb. without use of assistive device.   ° Time 12   ° Period Weeks   ° Status New   ° Target Date 01/11/22   °  ° PT LONG TERM GOAL #2  ° Title Pt will amb. 1000' with SPC with SBA on flat even and uneven surfaces for increased community accessibility.   ° Time 12   ° Period Weeks   ° Status New   ° Target Date 01/11/22   °  ° PT LONG TERM GOAL #3  ° Title Improve TUG score to </= 25 secs with appropriate assistive device to demo improved functional mobility and to reduce fall risk.   ° Baseline 1".07 secs with RW - 10-16-21   ° Time 12   ° Period Weeks   ° Status New   ° Target Date 01/11/22   °  ° PT LONG TERM GOAL #4  ° Title Increase Berg score to >/= 45/56 to reduce fall risk.   ° Baseline 27/56   ° Time 12   ° Period Weeks   ° Status New   ° Target Date  01/11/22   °  ° PT LONG TERM GOAL #5  ° Title Increase gait velocity from .43 ft/sec to >/= 1.6 ft/sec with RW for increased gait efficiency.   ° Time 12   ° Period Weeks   ° Status New   ° Target Date 01/11/22   °  ° PT LONG TERM GOAL #6  ° Title Independent in updated HEP as appropriate.   ° Time 12   ° Period Weeks   ° Status New   ° Target Date 01/11/22   ° °  °  ° °  ° ° ° ° ° ° ° ° Plan - 12/18/21 1251   ° ° Clinical Impression Statement Worked on gait training today with no AD with pt needing min guard/min A (with 2nd therapist on standby for safety with balance). Needed cues to slow   down pace at times and to relax BUE. Remainder of session worked on standing tolerance, BLE strengthening and standing balance with no UE support with dynamic tasks/compliant surfaces. Pt tolerated session well. Continued to encourage pt to perform balance specific HEP at home.    Personal Factors and Comorbidities Comorbidity 2;Profession    Comorbidities basilar artery stenosis, dyslipidemia,  HTN, neurogenic bladder, diplopia, anemia, Lt iliac artery stenosis, PAD    Examination-Activity Limitations Bathing;Carry;Continence;Toileting;Dressing;Lift;Stand;Stairs;Squat;Reach Overhead;Locomotion Level;Transfers    Examination-Participation Restrictions Cleaning;Community Activity;Driving;Interpersonal Relationship;Laundry;Shop;Occupation;Yard Work;Meal Prep    Stability/Clinical Decision Making Evolving/Moderate complexity    Rehab Potential Good    PT Frequency 2x / week    PT Duration 12 weeks    PT Treatment/Interventions ADLs/Self Care Home Management;Aquatic Therapy;DME Instruction;Gait training;Stair training;Therapeutic activities;Therapeutic exercise;Balance training;Neuromuscular re-education;Patient/family education;Vestibular    PT Next Visit Plan LAND =  Cont with gait without RW, trunk/core stabilization exercises and standing balance exs., coordination exercises esp for RLE. try floor transfers.    PT Home  Exercise Plan [id number]    Consulted and Agree with Plan of Care Patient;Family member/caregiver    Family Member Consulted wife, [name]             Patient will benefit from skilled therapeutic intervention in order to improve the following deficits and impairments:  Abnormal gait, Decreased activity tolerance, Decreased balance, Decreased coordination, Dizziness, Impaired sensation, Impaired UE functional use, Impaired vision/preception  Visit Diagnosis: Other symptoms and signs involving the nervous system  Other lack of coordination  Unsteadiness on feet     Problem List Patient Active Problem List   Diagnosis Date Noted   Familial hypercholesteremia 11/06/2021   PFO (patent foramen ovale) 11/06/2021   Urinary retention 09/27/2021   Diplopia 09/27/2021   Carotid stenosis, right 09/27/2021   Anemia 09/27/2021   Slow transit constipation    Benign essential HTN    Cerebellar stroke ([place]) 09/04/2021   Acute ischemic stroke [facility])    Cerebral embolism with cerebral infarction 08/30/2021   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

## 2021-12-18 NOTE — Therapy (Signed)
[facility] [address] [place], [place], [address] Phone: [phone]   Fax:  [phone]  Occupational Therapy Treatment  Patient Details  Name: Maurice Little MRN: [id number] Date of Birth: June 27, 1956 Referring Provider (OT): Lauraine Rinne, PA-C   Encounter Date: 12/18/2021   OT End of Session - 12/18/21 1044     Visit Number 21    Number of Visits 25    Date for OT Re-Evaluation 12/31/21    Authorization Type UHC--no visit limit/no auth, copay per day    OT Start Time 3614    OT Stop Time 1103    OT Time Calculation (min) 40 min    Activity Tolerance Patient tolerated treatment well    Behavior During Therapy [facility] for tasks assessed/performed             Past Medical History:  Diagnosis Date   Benign prostatic hyperplasia with elevated prostate specific antigen (PSA)    Hypertension    Iliac artery stenosis, left ([place])    with claudication resolved with stent   PAD (peripheral artery disease) ([place])    Stroke [facility])     Past Surgical History:  Procedure Laterality Date   BUBBLE STUDY  08/31/2021   Procedure: BUBBLE STUDY;  Surgeon: Skeet Latch, MD;  Location: [facility];  Service: Cardiovascular;;   ILIAC ARTERY STENT Left    IR ANGIO VERTEBRAL SEL SUBCLAVIAN INNOMINATE UNI L MOD SED  08/30/2021   IR ANGIO VERTEBRAL SEL SUBCLAVIAN INNOMINATE UNI R MOD SED  08/30/2021   IR CT HEAD LTD  08/30/2021   IR INTRA CRAN STENT  08/30/2021   IR RADIOLOGIST EVAL & MGMT  11/28/2021   IR US GUIDE VASC ACCESS RIGHT  08/30/2021   RADIOLOGY WITH ANESTHESIA N/A 08/30/2021   Procedure: IR WITH ANESTHESIA;  Surgeon: Luanne Bras, MD;  Location: [facility];  Service: Radiology;  Laterality: N/A;   TEE WITHOUT CARDIOVERSION N/A 08/31/2021   Procedure: TRANSESOPHAGEAL ECHOCARDIOGRAM (TEE);  Surgeon: Skeet Latch, MD;  Location: [facility];  Service: Cardiovascular;  Laterality: N/A;    There were no vitals  filed for this visit.   Subjective Assessment - 12/18/21 1042     Subjective  Patient reports that he is moving around a bit more at home (not as much as I could be)    Pertinent History CVA (R cereballar and R small R brachium potis and R pontomedullary junction).   PMH:  dyslipidemia, HTN, carotid stenosis, anemia, PAD    Limitations ataxia, diplopia, folley catheter, decr hearing (particularly R side)    Patient Stated Goals incr independence, get better    Currently in Pain? Yes    Pain Score 3    0-3/10   Pain Location Shoulder    Pain Orientation Right    Pain Descriptors / Indicators Aching    Pain Type Acute pain    Pain Onset 1 to 4 weeks ago    Pain Frequency Constant    Aggravating Factors  movement    Pain Relieving Factors proper positioning             Sitting, closed-chain shoulder flex and chest press with scapular retraction with ball with BUEs with min cueing/facilitation for proper positioning.  Sitting, wt. Bearing through [place] with hand on mat with body on arm movements for incr scapular stability then open-chain reaching to grasp/release cones with lateral wt. Shift/trunk rotation and min cueing for proper positioning of R shoulder and posture.  Sitting, placing  small pegs in pegboard to copy design with low range reaching in ER with min cueing for shoulder positioning/compensation and removing with in-hand manipulation with min cueing.  Copied design with good accuracy and incr time for coordination.    Ambulation with RW with environmental scanning in mod distracting environment with CGA and 14/15 items found (93% accuracy), no LOB.       OT Short Term Goals - 12/04/21 0854       OT SHORT TERM GOAL #1   Title Pt will be independent with initial HEP.--check STGs 11/02/21    Time 4    Period Weeks    Status Achieved      OT SHORT TERM GOAL #2   Title Pt will perform BADLs mod I except supervision for shower transfer.    Time 4    Period Weeks     Status Achieved   min A for back, supervision for standing to pull up pants.  11/20/21:  supervision for ambulation for toileting (mod I for tolieting at night with [facility]), supervision for shower transfer.  12/04/21:  now ambulating to restroom mod I     OT SHORT TERM GOAL #3   Title Pt will perform tabletop visual scanning with 90% accuracy and no reports of diplopia.    Time 4    Period Weeks    Status Achieved      OT SHORT TERM GOAL #4   Title Pt will verbalize understanding of visual compensation strategies for ADLs/IADLs prn (including ?taping, head turns/positioning)    Time 4    Period Weeks    Status Achieved   Pt/ wife verbalize understanding     OT SHORT TERM GOAL #5   Title Pt will improve coordination for ADLs as shown by improving time on 9-hole peg test by at least 5sec bilaterally.    Baseline R-66.85sec, L-41.25sec    Time 4    Period Weeks    Status Achieved   R 65.25-not met yet improvedd, L 33.60- met partially met, met for LUE.  11/20/21:  R-51.75sec              OT Long Term Goals - 12/18/21 1126       OT LONG TERM GOAL #1   Title Pt will be independent with updated HEP.    Time 12    Period Weeks    Status New      OT LONG TERM GOAL #2   Title Pt will perform simple snack prep/home maintenance task with supervision.--11/29/21 met initial goal, updated 11/29/21:  Pt will peform simple cooking and home maintenance tasks mod I.    Time 12    Period Weeks    Status Revised      OT LONG TERM GOAL #3   Title Pt will perform environmental scanning/navigation with at least 90% accuracy for incr safety in the home/community.    Time 12    Period Weeks    Status On-going   12/18/21:  CGA with 93% accuracy in mod distracting environment     OT LONG TERM GOAL #4   Title Pt will improve RUE coordination as shown by improving time on 9-hole peg test by at least 15sec.--met.  11/27/21 upgraded:  Pt will complete 9-hole peg test in 45sec or less with RUE.    Baseline  66.85sec    Time 12    Period Weeks    Status Revised      OT LONG TERM GOAL #5  Title Pt will be able to retrieve/replace 1-2 objects on overhead shelf with each UE x10 safetly and with good control for IADL tasks.    Time 12    Period Weeks    Status New      OT LONG TERM GOAL #6   Title Pt will be able to type with at least 80% accuracy with at least 20wpm in prep for work tasks.    Baseline 39% with 10wpm    Time 12    Period Weeks    Status Deferred   pt now not planning to go back to work     OT [place] #7   Title Pt will be able to write at least 4 sentences with 100% legibility in reasonable amount of time.    Time 12    Period Weeks    Status On-going   11/29/21:  approx 90% legibility                  Plan - 12/18/21 1127     Clinical Impression Statement Pt continues to make good progress with improving balance and coordination and environmental scanning.  Pt continues to need cueing for R shoulder positioning which appears to be contributing to pain.    OT Occupational Profile and History Detailed Assessment- Review of Records and additional review of physical, cognitive, psychosocial history related to current functional performance    Occupational performance deficits (Please refer to evaluation for details): ADL's;IADL's;Work;Leisure    Body Structure / Function / Physical Skills ADL;UE functional use;Balance;FMC;Coordination;Sensation;IADL;Decreased knowledge of use of DME;Dexterity;Vision    Rehab Potential Good    Clinical Decision Making Several treatment options, min-mod task modification necessary    Comorbidities Affecting Occupational Performance: May have comorbidities impacting occupational performance    Modification or Assistance to Complete Evaluation  Min-Moderate modification of tasks or assist with assess necessary to complete eval    OT Frequency 2x / week    OT Duration 12 weeks   +eval   OT Treatment/Interventions Self-care/ADL  training;Energy conservation;Ultrasound;Visual/perceptual remediation/compensation;Patient/family education;DME and/or AE instruction;Aquatic Therapy;Paraffin;Passive range of motion;Balance training;Cryotherapy;Functional Mobility Training;Moist Heat;Therapeutic exercise;Manual Therapy;Therapeutic activities;Neuromuscular education    Plan IADL tasks, functional mobility/balance; montior R shoulder pain prn--?kinesiotape    OT Home Exercise Plan Pt reports that he has some coordination activities and visual HEP from hospital    Consulted and Agree with Plan of Care Patient;Family member/caregiver    Family Member Consulted wife             Patient will benefit from skilled therapeutic intervention in order to improve the following deficits and impairments:   Body Structure / Function / Physical Skills: ADL, UE functional use, Balance, FMC, Coordination, Sensation, IADL, Decreased knowledge of use of DME, Dexterity, Vision       Visit Diagnosis: Other symptoms and signs involving the nervous system  Other lack of coordination  Unsteadiness on feet  Visuospatial deficit  Other disturbances of skin sensation  Other abnormalities of gait and mobility  Ataxia    Problem List Patient Active Problem List   Diagnosis Date Noted   Familial hypercholesteremia 11/06/2021   PFO (patent foramen ovale) 11/06/2021   Urinary retention 09/27/2021   Diplopia 09/27/2021   Carotid stenosis, right 09/27/2021   Anemia 09/27/2021   Slow transit constipation    Benign essential HTN    Cerebellar stroke ([place]) 09/04/2021   Acute ischemic stroke (HCC)    Cerebral embolism with cerebral infarction 08/30/2021   Occlusion  and stenosis of basilar artery with cerebral infarction [facility]) 08/30/2021   Basilar artery stenosis 08/29/2021    [facility], OT 12/18/2021, 11:29 AM  [facility] [address] [place], [place],  [address] Phone: [phone]   Fax:  [phone]  Name: Lemuel Boodram MRN: [id number] Date of Birth: Aug 04, 1956  Vianne Bulls, OTR/L [facility] [address] [place], [place]  [address] [phone] phone [phone] 12/18/21 11:29 AM

## 2021-12-20 ENCOUNTER — Other Ambulatory Visit: Payer: Self-pay

## 2021-12-20 ENCOUNTER — Ambulatory Visit: Payer: 59 | Admitting: Physical Therapy

## 2021-12-20 ENCOUNTER — Encounter: Payer: Self-pay | Admitting: Occupational Therapy

## 2021-12-20 ENCOUNTER — Ambulatory Visit: Payer: 59 | Admitting: Occupational Therapy

## 2021-12-20 DIAGNOSIS — R2689 Other abnormalities of gait and mobility: Secondary | ICD-10-CM

## 2021-12-20 DIAGNOSIS — R2681 Unsteadiness on feet: Secondary | ICD-10-CM

## 2021-12-20 DIAGNOSIS — R278 Other lack of coordination: Secondary | ICD-10-CM

## 2021-12-20 DIAGNOSIS — R270 Ataxia, unspecified: Secondary | ICD-10-CM

## 2021-12-20 DIAGNOSIS — R29818 Other symptoms and signs involving the nervous system: Secondary | ICD-10-CM

## 2021-12-20 DIAGNOSIS — R208 Other disturbances of skin sensation: Secondary | ICD-10-CM

## 2021-12-20 DIAGNOSIS — R41842 Visuospatial deficit: Secondary | ICD-10-CM

## 2021-12-20 NOTE — Therapy (Signed)
[facility] [address] [place], [place], [address] Phone: [phone]   Fax:  [phone]  Occupational Therapy Treatment  Patient Details  Name: Maurice Little MRN: [id number] Date of Birth: December 25, 1955 Referring Provider (OT): Lauraine Rinne, PA-C   Encounter Date: 12/20/2021   OT End of Session - 12/20/21 0938     Visit Number 22    Number of Visits 25    Date for OT Re-Evaluation 12/31/21    Authorization Type UHC--no visit limit/no auth, copay per day    OT Start Time 0934    OT Stop Time 1015    OT Time Calculation (min) 41 min    Activity Tolerance Patient tolerated treatment well    Behavior During Therapy [facility] for tasks assessed/performed             Past Medical History:  Diagnosis Date   Benign prostatic hyperplasia with elevated prostate specific antigen (PSA)    Hypertension    Iliac artery stenosis, left ([place])    with claudication resolved with stent   PAD (peripheral artery disease) ([place])    Stroke [facility])     Past Surgical History:  Procedure Laterality Date   BUBBLE STUDY  08/31/2021   Procedure: BUBBLE STUDY;  Surgeon: Skeet Latch, MD;  Location: [facility];  Service: Cardiovascular;;   ILIAC ARTERY STENT Left    IR ANGIO VERTEBRAL SEL SUBCLAVIAN INNOMINATE UNI L MOD SED  08/30/2021   IR ANGIO VERTEBRAL SEL SUBCLAVIAN INNOMINATE UNI R MOD SED  08/30/2021   IR CT HEAD LTD  08/30/2021   IR INTRA CRAN STENT  08/30/2021   IR RADIOLOGIST EVAL & MGMT  11/28/2021   IR US GUIDE VASC ACCESS RIGHT  08/30/2021   RADIOLOGY WITH ANESTHESIA N/A 08/30/2021   Procedure: IR WITH ANESTHESIA;  Surgeon: Luanne Bras, MD;  Location: [place];  Service: Radiology;  Laterality: N/A;   TEE WITHOUT CARDIOVERSION N/A 08/31/2021   Procedure: TRANSESOPHAGEAL ECHOCARDIOGRAM (TEE);  Surgeon: Skeet Latch, MD;  Location: [facility];  Service: Cardiovascular;  Laterality: N/A;    There were no vitals  filed for this visit.   Subjective Assessment - 12/20/21 0936     Subjective  Pt reports that he got all his breakfast and put stuff away himself.  Pt sees neuro-ophthalmologist again tomorrow.    Pertinent History CVA (R cereballar and R small R brachium potis and R pontomedullary junction).   PMH:  dyslipidemia, HTN, carotid stenosis, anemia, PAD    Limitations ataxia, diplopia, folley catheter, decr hearing (particularly R side)    Patient Stated Goals incr independence, get better    Currently in Pain? Yes    Pain Score 1     Pain Location Shoulder    Pain Orientation Right    Pain Descriptors / Indicators Aching    Pain Type Acute pain    Pain Onset 1 to 4 weeks ago    Pain Frequency Intermittent    Aggravating Factors  using it    Pain Relieving Factors keeping shoulder down              Applied Kinesiotape to R shoulder (correction piece) to facilitate awareness of positioning and for pain.    Sitting, closed-chain shoulder flex and chest press with scapular retraction with ball with BUEs with min cueing/facilitation for proper positioning.  Sitting, wt. Bearing through [place] with hand on mat with body on arm movements for incr scapular stability then open-chain reaching to grasp/release cones  with lateral wt. Shift/trunk rotation and min cueing for proper positioning of R shoulder and posture.  Began discussing progress towards goals and anticipated renewal.  Standing and performing shoulder flex/elbow ext AAROM with UE ranger with min cueing for scapular depression.  Ambulated with RW with supervision.          OT Education - 12/20/21 1221     Education Details Kinesiotape wear and precautions    Person(s) Educated Patient;Spouse    Methods Explanation    Comprehension Verbalized understanding              OT Short Term Goals - 12/04/21 0854       OT SHORT TERM GOAL #1   Title Pt will be independent with initial HEP.--check STGs 11/02/21    Time 4     Period Weeks    Status Achieved      OT SHORT TERM GOAL #2   Title Pt will perform BADLs mod I except supervision for shower transfer.    Time 4    Period Weeks    Status Achieved   min A for back, supervision for standing to pull up pants.  11/20/21:  supervision for ambulation for toileting (mod I for tolieting at night with [facility]), supervision for shower transfer.  12/04/21:  now ambulating to restroom mod I     OT SHORT TERM GOAL #3   Title Pt will perform tabletop visual scanning with 90% accuracy and no reports of diplopia.    Time 4    Period Weeks    Status Achieved      OT SHORT TERM GOAL #4   Title Pt will verbalize understanding of visual compensation strategies for ADLs/IADLs prn (including ?taping, head turns/positioning)    Time 4    Period Weeks    Status Achieved   Pt/ wife verbalize understanding     OT SHORT TERM GOAL #5   Title Pt will improve coordination for ADLs as shown by improving time on 9-hole peg test by at least 5sec bilaterally.    Baseline R-66.85sec, L-41.25sec    Time 4    Period Weeks    Status Achieved   R 65.25-not met yet improvedd, L 33.60- met partially met, met for LUE.  11/20/21:  R-51.75sec              OT Long Term Goals - 12/18/21 1126       OT LONG TERM GOAL #1   Title Pt will be independent with updated HEP.    Time 12    Period Weeks    Status New      OT LONG TERM GOAL #2   Title Pt will perform simple snack prep/home maintenance task with supervision.--11/29/21 met initial goal, updated 11/29/21:  Pt will peform simple cooking and home maintenance tasks mod I.    Time 12    Period Weeks    Status Revised      OT LONG TERM GOAL #3   Title Pt will perform environmental scanning/navigation with at least 90% accuracy for incr safety in the home/community.    Time 12    Period Weeks    Status On-going   12/18/21:  CGA with 93% accuracy in mod distracting environment     OT LONG TERM GOAL #4   Title Pt will improve RUE  coordination as shown by improving time on 9-hole peg test by at least 15sec.--met.  11/27/21 upgraded:  Pt will complete 9-hole peg test in  45sec or less with RUE.    Baseline 66.85sec    Time 12    Period Weeks    Status Revised      OT LONG TERM GOAL #5   Title Pt will be able to retrieve/replace 1-2 objects on overhead shelf with each UE x10 safetly and with good control for IADL tasks.    Time 12    Period Weeks    Status New      OT LONG TERM GOAL #6   Title Pt will be able to type with at least 80% accuracy with at least 20wpm in prep for work tasks.    Baseline 39% with 10wpm    Time 12    Period Weeks    Status Deferred   pt now not planning to go back to work     OT [place] #7   Title Pt will be able to write at least 4 sentences with 100% legibility in reasonable amount of time.    Time 12    Period Weeks    Status On-going   11/29/21:  approx 90% legibility                  Plan - 12/20/21 0938     Clinical Impression Statement Pt reports improved R shoulder pain today.  Pt also reports getting his own breakfast.    OT Occupational Profile and History Detailed Assessment- Review of Records and additional review of physical, cognitive, psychosocial history related to current functional performance    Occupational performance deficits (Please refer to evaluation for details): ADL's;IADL's;Work;Leisure    Body Structure / Function / Physical Skills ADL;UE functional use;Balance;FMC;Coordination;Sensation;IADL;Decreased knowledge of use of DME;Dexterity;Vision    Rehab Potential Good    Clinical Decision Making Several treatment options, min-mod task modification necessary    Comorbidities Affecting Occupational Performance: May have comorbidities impacting occupational performance    Modification or Assistance to Complete Evaluation  Min-Moderate modification of tasks or assist with assess necessary to complete eval    OT Frequency 2x / week    OT Duration  12 weeks   +eval   OT Treatment/Interventions Self-care/ADL training;Energy conservation;Ultrasound;Visual/perceptual remediation/compensation;Patient/family education;DME and/or AE instruction;Aquatic Therapy;Paraffin;Passive range of motion;Balance training;Cryotherapy;Functional Mobility Training;Moist Heat;Therapeutic exercise;Manual Therapy;Therapeutic activities;Neuromuscular education    Plan montior R shoulder pain prn--assess kinesiotape; assess goals and anticipate renewal next week    OT Home Exercise Plan Pt reports that he has some coordination activities and visual HEP from hospital    Consulted and Agree with Plan of Care Patient;Family member/caregiver    Family Member Consulted wife             Patient will benefit from skilled therapeutic intervention in order to improve the following deficits and impairments:   Body Structure / Function / Physical Skills: ADL, UE functional use, Balance, FMC, Coordination, Sensation, IADL, Decreased knowledge of use of DME, Dexterity, Vision       Visit Diagnosis: Other symptoms and signs involving the nervous system  Other lack of coordination  Unsteadiness on feet  Visuospatial deficit  Other disturbances of skin sensation  Other abnormalities of gait and mobility  Ataxia    Problem List Patient Active Problem List   Diagnosis Date Noted   Familial hypercholesteremia 11/06/2021   PFO (patent foramen ovale) 11/06/2021   Urinary retention 09/27/2021   Diplopia 09/27/2021   Carotid stenosis, right 09/27/2021   Anemia 09/27/2021   Slow transit constipation    Benign essential HTN  Cerebellar stroke ([place]) 09/04/2021   Acute ischemic stroke [facility])    Cerebral embolism with cerebral infarction 08/30/2021   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 [place], [place]  [address] [phone] phone [phone] 12/20/21 12:22 PM

## 2021-12-20 NOTE — Therapy (Addendum)
[facility] [address] [place], [place], [address] Phone: [phone]   Fax:  [phone]  Physical Therapy Treatment  Patient Details  Name: Maurice Little MRN: [id number] Date of Birth: 07-24-1956 Referring Provider (PT): Lauraine Rinne, PA-C   Encounter Date: 12/20/2021   PT End of Session - 12/20/21 1104     Visit Number 18    Number of Visits 25    Date for PT Re-Evaluation 01/11/22    Authorization Type UHC ([place] with no auth required)    PT Start Time 1015    PT Stop Time 1102    PT Time Calculation (min) 47 min    Equipment Utilized During Treatment Gait belt    Activity Tolerance Patient tolerated treatment well    Behavior During Therapy W[facility] for tasks assessed/performed             Past Medical History:  Diagnosis Date   Benign prostatic hyperplasia with elevated prostate specific antigen (PSA)    Hypertension    Iliac artery stenosis, left ([place])    with claudication resolved with stent   PAD (peripheral artery disease) ([place])    Stroke [facility])     Past Surgical History:  Procedure Laterality Date   BUBBLE STUDY  08/31/2021   Procedure: BUBBLE STUDY;  Surgeon: Skeet Latch, MD;  Location: [facility];  Service: Cardiovascular;;   ILIAC ARTERY STENT Left    IR ANGIO VERTEBRAL SEL SUBCLAVIAN INNOMINATE UNI L MOD SED  08/30/2021   IR ANGIO VERTEBRAL SEL SUBCLAVIAN INNOMINATE UNI R MOD SED  08/30/2021   IR CT HEAD LTD  08/30/2021   IR INTRA CRAN STENT  08/30/2021   IR RADIOLOGIST EVAL & MGMT  11/28/2021   IR US GUIDE VASC ACCESS RIGHT  08/30/2021   RADIOLOGY WITH ANESTHESIA N/A 08/30/2021   Procedure: IR WITH ANESTHESIA;  Surgeon: Luanne Bras, MD;  Location: [place];  Service: Radiology;  Laterality: N/A;   TEE WITHOUT CARDIOVERSION N/A 08/31/2021   Procedure: TRANSESOPHAGEAL ECHOCARDIOGRAM (TEE);  Surgeon: Skeet Latch, MD;  Location: [facility];  Service: Cardiovascular;  Laterality:  N/A;    There were no vitals filed for this visit.   Subjective Assessment - 12/20/21 1016     Subjective "I am doing a lot". Reports walking at home going well w/wife and doing exercises. Having mild R shoulder pain but states it is manageable    Pertinent History Basilar artery stenosis, urinary retention with indwelling catheter, neurogenic bladder, diplopia, Rt cerebellar infarct, HTN, Rt carotid stenosis, anemia, s/p cerebral angiogram with stent    Limitations Lifting;Standing;Walking;House hold activities;Reading    Patient Stated Goals "I want to be normal again - walk without walker", decrease numbness on Rt side of body    Currently in Pain? Yes    Pain Score 1     Pain Location Shoulder    Pain Orientation Right    Pain Onset 1 to 4 weeks ago                               [facility] Adult PT Treatment/Exercise - 12/20/21 1030       Transfers   Transfers Floor to Transfer    Floor to Transfer 4: Min guard;With upper extremity assist;Other (comment)    Comments Floor transfer from mat to red mat w/min guard for added safety and use of BUEs. Pt able to teach back sequence and demonstrate without need  for verbal cues. Pt demonstrates the greatest instability with immediate standing balance and turning.      Ambulation/Gait   Ambulation/Gait Yes    Ambulation/Gait Assistance 4: Min guard;4: Min assist    Ambulation/Gait Assistance Details With +2 guard for added safety outdoors w/RW. Noted significant lateral gait deviations to L side, varying gait speed leading to decreased balance w/greater velocity and poor AD management. Mod verbal cues to maintain safe distance to RW and slow gait speed for improved stability. Indoors, pt ambulated 55' without AD and min guard +2 for safety due to multiple posterolateral LOB episodes to R side, varying gait speed and poor step clearance of RLE. Mod verbal cues for pt to begin to intiate reciprocal arm swing to assist w/balance  and minimize sagittal plane stiffness.    Ambulation Distance (Feet) 250 Feet   Feet oudoor, 345' indoor   Assistive device Rolling walker;None    Gait Pattern Step-through pattern;Ataxic;Decreased weight shift to right;Decreased weight shift to left;Wide base of support    Ambulation Surface Paved;Outdoor;Indoor;Level                 Balance Exercises - 12/20/21 1101       Balance Exercises: Standing   Other Standing Exercises Pt performed cone taps to 3 varied-color cones with random order called out by therapist for single leg stability, BLE coordination and sequencing. +2 min guard for safety                  PT Short Term Goals - 12/13/21 7867       PT SHORT TERM GOAL #1   Title Pt will perform basic transfers wheelchair to/from mat using squat pivot transfer with supervision.  Upgrade - Perform basic transfers modified independently. (Target date all STG's 12-14-21)    Baseline performed with supervision in previous session; met 12-13-21    Time 4    Period Weeks    Status Achieved    Target Date 12/14/21      PT SHORT TERM GOAL #2   Title Pt will improve Berg score from 27/56 to >/= 32/56 to demo improved balance with reduced fall risk.  Upgraded goal - increase score to >/= 40/56    Baseline 27/56; 35/56 11-15-21     score 35/56 on 12-13-21    Time 4    Period Weeks    Status Not Met    Target Date 12/14/21      PT SHORT TERM GOAL #3   Title Improve TUG score from 1".07 secs to </= 50 secs with RW with CGA.;  Upgraded goal - improve score from 31.75 secs to </= 25 secs with RW    Baseline 1" 7 secs with RW; 31.75 seconds on 11/13/21;  33.31 with RW on 12-13-21    Time 4    Period Weeks    Status Not Met    Target Date 12/14/21      PT SHORT TERM GOAL #4   Title Perform sit to stand from mat to RW with SBA with normal BOS. Upgraded goal - perform sit to stand 5 times without UE support without LOB with SBA    Baseline met with using BUE support; met  12-13-21    Time 4    Period Weeks    Status Achieved    Target Date 12/14/21      PT SHORT TERM GOAL #5   Title Amb. 40' with RW with CGA on flat, even surface. Upgraded  goal 65' with RW with SBA on flat, even surface    Baseline 230' with CGA with RW on 11/13/21;  350' with RW with SBA on 12-13-21    Time 4    Period Weeks    Status Achieved    Target Date 12/14/21      PT SHORT TERM GOAL #6   Title Perform HEP for standing balance with wife's assistance.    Baseline new balance exercises issued on 11-15-21; pt reports not doing balance exercises regularly - 12-13-21    Time 4    Period Weeks    Status On-going    Target Date 12/14/21               PT Long Term Goals - 12/14/21 0908       PT LONG TERM GOAL #1   Title Pt will be modified independent with household amb. without use of assistive device.    Time 12    Period Weeks    Status New    Target Date 01/11/22      PT LONG TERM GOAL #2   Title Pt will amb. 1000' with SPC with SBA on flat even and uneven surfaces for increased community accessibility.    Time 12    Period Weeks    Status New    Target Date 01/11/22      PT LONG TERM GOAL #3   Title Improve TUG score to </= 25 secs with appropriate assistive device to demo improved functional mobility and to reduce fall risk.    Baseline 1".07 secs with RW - 10-16-21    Time 12    Period Weeks    Status New    Target Date 01/11/22      PT LONG TERM GOAL #4   Title Increase Berg score to >/= 45/56 to reduce fall risk.    Baseline 27/56    Time 12    Period Weeks    Status New    Target Date 01/11/22      PT LONG TERM GOAL #5   Title Increase gait velocity from .43 ft/sec to >/= 1.6 ft/sec with RW for increased gait efficiency.    Time 12    Period Weeks    Status New    Target Date 01/11/22      PT LONG TERM GOAL #6   Title Independent in updated HEP as appropriate.    Time 12    Period Weeks    Status New    Target Date 01/11/22                    Plan - 12/20/21 1106     Clinical Impression Statement Emphasis of session on gait training with no AD, floor transfers, single leg stability and BLE coordination. Pt continues to require min A during gait w/ and without AD for lateral LOB to R side and mod verbal cues to reduce gait velocity for improved stability. Pt improving w/BLE coordination and motor planning, but reports being limited by his "lightheadedness that never goes away". Pt is progressing towards LTGs    Personal Factors and Comorbidities Comorbidity 2;Profession    Comorbidities basilar artery stenosis, dyslipidemia,  HTN, neurogenic bladder, diplopia, anemia, Lt iliac artery stenosis, PAD    Examination-Activity Limitations Bathing;Carry;Continence;Toileting;Dressing;Lift;Stand;Stairs;Squat;Reach Overhead;Locomotion Level;Transfers    Examination-Participation Restrictions Cleaning;Community Activity;Driving;Interpersonal Relationship;Laundry;Shop;Occupation;Yard Work;Meal Prep    Stability/Clinical Decision Making Evolving/Moderate complexity    Rehab Potential Good    PT Frequency  2x / week    PT Duration 12 weeks    PT Treatment/Interventions ADLs/Self Care Home Management;Aquatic Therapy;DME Instruction;Gait training;Stair training;Therapeutic activities;Therapeutic exercise;Balance training;Neuromuscular re-education;Patient/family education;Vestibular    PT Next Visit Plan LAND =  Cont with gait without RW, trunk/core stabilization exercises and standing balance exs., coordination exercises esp for RLE.    PT Home Exercise Plan [id number]    Consulted and Agree with Plan of Care Patient;Family member/caregiver    Family Member Consulted wife, [name]             Patient will benefit from skilled therapeutic intervention in order to improve the following deficits and impairments:  Abnormal gait, Decreased activity tolerance, Decreased balance, Decreased coordination, Dizziness, Impaired sensation, Impaired  UE functional use, Impaired vision/preception  Visit Diagnosis: Unsteadiness on feet  Other abnormalities of gait and mobility  Other lack of coordination     Problem List Patient Active Problem List   Diagnosis Date Noted   Familial hypercholesteremia 11/06/2021   PFO (patent foramen ovale) 11/06/2021   Urinary retention 09/27/2021   Diplopia 09/27/2021   Carotid stenosis, right 09/27/2021   Anemia 09/27/2021   Slow transit constipation    Benign essential HTN    Cerebellar stroke ([place]) 09/04/2021   Acute ischemic stroke [facility])    Cerebral embolism with cerebral infarction 08/30/2021   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 [place], [place], [address] Phone: [phone]   Fax:  [phone]  Name: Maurice Little MRN: [id number] Date of Birth: 1956-02-24

## 2021-12-24 ENCOUNTER — Encounter: Payer: 59 | Admitting: Occupational Therapy

## 2021-12-25 ENCOUNTER — Encounter: Payer: Self-pay | Admitting: Occupational Therapy

## 2021-12-25 ENCOUNTER — Other Ambulatory Visit: Payer: Self-pay

## 2021-12-25 ENCOUNTER — Ambulatory Visit: Payer: 59 | Admitting: Physical Therapy

## 2021-12-25 ENCOUNTER — Ambulatory Visit: Payer: 59 | Admitting: Occupational Therapy

## 2021-12-25 DIAGNOSIS — R2689 Other abnormalities of gait and mobility: Secondary | ICD-10-CM

## 2021-12-25 DIAGNOSIS — R29818 Other symptoms and signs involving the nervous system: Secondary | ICD-10-CM

## 2021-12-25 DIAGNOSIS — R278 Other lack of coordination: Secondary | ICD-10-CM

## 2021-12-25 DIAGNOSIS — R2681 Unsteadiness on feet: Secondary | ICD-10-CM

## 2021-12-25 DIAGNOSIS — R270 Ataxia, unspecified: Secondary | ICD-10-CM

## 2021-12-25 DIAGNOSIS — R208 Other disturbances of skin sensation: Secondary | ICD-10-CM

## 2021-12-25 DIAGNOSIS — R260 Ataxic gait: Secondary | ICD-10-CM

## 2021-12-25 DIAGNOSIS — R41842 Visuospatial deficit: Secondary | ICD-10-CM

## 2021-12-25 NOTE — Therapy (Signed)
[facility] [address] [place], [place], [address] Phone: [phone]   Fax:  [phone]  Occupational Therapy Treatment  Patient Details  Name: Maurice Little MRN: [id number] Date of Birth: April 29, 1956 Referring Provider (OT): Lauraine Rinne, PA-C   Encounter Date: 12/25/2021   OT End of Session - 12/25/21 0939     Visit Number 23    Number of Visits 25    Date for OT Re-Evaluation 12/31/21    Authorization Type UHC--no visit limit/no auth, copay per day    OT Start Time [phone]    OT Stop Time 1015    OT Time Calculation (min) 39 min    Activity Tolerance Patient tolerated treatment well    Behavior During Therapy [facility] for tasks assessed/performed             Past Medical History:  Diagnosis Date   Benign prostatic hyperplasia with elevated prostate specific antigen (PSA)    Hypertension    Iliac artery stenosis, left (HCC)    with claudication resolved with stent   PAD (peripheral artery disease) ([place])    Stroke [facility])     Past Surgical History:  Procedure Laterality Date   BUBBLE STUDY  08/31/2021   Procedure: BUBBLE STUDY;  Surgeon: Skeet Latch, MD;  Location: [facility];  Service: Cardiovascular;;   ILIAC ARTERY STENT Left    IR ANGIO VERTEBRAL SEL SUBCLAVIAN INNOMINATE UNI L MOD SED  08/30/2021   IR ANGIO VERTEBRAL SEL SUBCLAVIAN INNOMINATE UNI R MOD SED  08/30/2021   IR CT HEAD LTD  08/30/2021   IR INTRA CRAN STENT  08/30/2021   IR RADIOLOGIST EVAL & MGMT  11/28/2021   IR US GUIDE VASC ACCESS RIGHT  08/30/2021   RADIOLOGY WITH ANESTHESIA N/A 08/30/2021   Procedure: IR WITH ANESTHESIA;  Surgeon: Luanne Bras, MD;  Location: [place];  Service: Radiology;  Laterality: N/A;   TEE WITHOUT CARDIOVERSION N/A 08/31/2021   Procedure: TRANSESOPHAGEAL ECHOCARDIOGRAM (TEE);  Surgeon: Skeet Latch, MD;  Location: [facility];  Service: Cardiovascular;  Laterality: N/A;    There were no vitals  filed for this visit.   Subjective Assessment - 12/25/21 0938     Subjective  Pt reports that he didn't notice a difference with the pain with kinesiotape.  Pt reports that he goes for CT scan tomorrow to check carotid blockages.  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  Pt reports that Dr. Frederico Hamman thinks that his eye is improving and is hopeful that vision deficits will resolve.    Pertinent History CVA (R cereballar and R small R brachium potis and R pontomedullary junction).   PMH:  dyslipidemia, HTN, carotid stenosis, anemia, PAD    Limitations ataxia, diplopia, folley catheter, decr hearing (particularly R side)    Patient Stated Goals incr independence, get better    Currently in Pain? Yes    Pain Score 1     Pain Location Shoulder    Pain Orientation Right    Pain Descriptors / Indicators Aching    Pain Type Acute pain    Pain Onset 1 to 4 weeks ago    Pain Frequency Intermittent    Aggravating Factors  using it    Pain Relieving Factors also tried medicated patches and had massages that helped some              Flipping cards with R hand with focus on shoulder positioning and shoulder ER.  Began checking LTGs and discussing progress in prep for renewal (see below for updates).  Reviewed importance of shoulder HEP for home and awareness of R shoulder positioning.  Pt wrote 4 sentences with approx 75% legibility.  Emphasized importance of practicing writing, typing, and coordination HEP/activities at home.  Standing, functional reaching laterally and overhead to grasp/release cylinder objects on overhead shelf with min difficulty with coordination and min cueing/facilitation for positioning of R shoulder.  Pt reports 1-2/10 pain.  Then standing, performing step and reach to the R with min cueing incorporating wt. Shift and trunk rotation in prep for IADL tasks (close supervision for balance).       OT Short Term Goals - 12/04/21 0854       OT SHORT TERM  GOAL #1   Title Pt will be independent with initial HEP.--check STGs 11/02/21    Time 4    Period Weeks    Status Achieved      OT SHORT TERM GOAL #2   Title Pt will perform BADLs mod I except supervision for shower transfer.    Time 4    Period Weeks    Status Achieved   min A for back, supervision for standing to pull up pants.  11/20/21:  supervision for ambulation for toileting (mod I for tolieting at night with [facility]), supervision for shower transfer.  12/04/21:  now ambulating to restroom mod I     OT SHORT TERM GOAL #3   Title Pt will perform tabletop visual scanning with 90% accuracy and no reports of diplopia.    Time 4    Period Weeks    Status Achieved      OT SHORT TERM GOAL #4   Title Pt will verbalize understanding of visual compensation strategies for ADLs/IADLs prn (including ?taping, head turns/positioning)    Time 4    Period Weeks    Status Achieved   Pt/ wife verbalize understanding     OT SHORT TERM GOAL #5   Title Pt will improve coordination for ADLs as shown by improving time on 9-hole peg test by at least 5sec bilaterally.    Baseline R-66.85sec, L-41.25sec    Time 4    Period Weeks    Status Achieved   R 65.25-not met yet improvedd, L 33.60- met partially met, met for LUE.  11/20/21:  R-51.75sec              OT Long Term Goals - 12/18/21 1126       OT LONG TERM GOAL #1   Title Pt will be independent with updated HEP.    Time 12    Period Weeks    Status New      OT LONG TERM GOAL #2   Title Pt will perform simple snack prep/home maintenance task with supervision.--11/29/21 met initial goal, updated 11/29/21:  Pt will peform simple cooking and home maintenance tasks mod I.    Time 12    Period Weeks    Status Revised      OT LONG TERM GOAL #3   Title Pt will perform environmental scanning/navigation with at least 90% accuracy for incr safety in the home/community.    Time 12    Period Weeks    Status On-going   12/18/21:  CGA with 93% accuracy in  mod distracting environment     OT LONG TERM GOAL #4   Title Pt will improve RUE coordination as shown by improving time on 9-hole peg test by at  least 15sec.--met.  11/27/21 upgraded:  Pt will complete 9-hole peg test in 45sec or less with RUE.    Baseline 66.85sec    Time 12    Period Weeks    Status Revised      OT LONG TERM GOAL #5   Title Pt will be able to retrieve/replace 1-2 objects on overhead shelf with each UE x10 safetly and with good control for IADL tasks.    Time 12    Period Weeks    Status New      OT LONG TERM GOAL #6   Title Pt will be able to type with at least 80% accuracy with at least 20wpm in prep for work tasks.    Baseline 39% with 10wpm    Time 12    Period Weeks    Status Deferred   pt now not planning to go back to work     OT [place] #7   Title Pt will be able to write at least 4 sentences with 100% legibility in reasonable amount of time.    Time 12    Period Weeks    Status On-going   11/29/21:  approx 90% legibility                  Plan - 12/25/21 0940     Clinical Impression Statement Pt reports that shoulder pain is inconsistent.    OT Occupational Profile and History Detailed Assessment- Review of Records and additional review of physical, cognitive, psychosocial history related to current functional performance    Occupational performance deficits (Please refer to evaluation for details): ADL's;IADL's;Work;Leisure    Body Structure / Function / Physical Skills ADL;UE functional use;Balance;FMC;Coordination;Sensation;IADL;Decreased knowledge of use of DME;Dexterity;Vision    Rehab Potential Good    Clinical Decision Making Several treatment options, min-mod task modification necessary    Comorbidities Affecting Occupational Performance: May have comorbidities impacting occupational performance    Modification or Assistance to Complete Evaluation  Min-Moderate modification of tasks or assist with assess necessary to complete eval     OT Frequency 2x / week    OT Duration 12 weeks   +eval   OT Treatment/Interventions Self-care/ADL training;Energy conservation;Ultrasound;Visual/perceptual remediation/compensation;Patient/family education;DME and/or AE instruction;Aquatic Therapy;Paraffin;Passive range of motion;Balance training;Cryotherapy;Functional Mobility Training;Moist Heat;Therapeutic exercise;Manual Therapy;Therapeutic activities;Neuromuscular education    Plan check remaining goals and renew    OT Home Exercise Plan Pt reports that he has some coordination activities and visual HEP from hospital    Consulted and Agree with Plan of Care Patient;Family member/caregiver    Family Member Consulted wife             Patient will benefit from skilled therapeutic intervention in order to improve the following deficits and impairments:   Body Structure / Function / Physical Skills: ADL, UE functional use, Balance, FMC, Coordination, Sensation, IADL, Decreased knowledge of use of DME, Dexterity, Vision       Visit Diagnosis: Other symptoms and signs involving the nervous system  Other lack of coordination  Unsteadiness on feet  Visuospatial deficit  Other disturbances of skin sensation  Ataxia  Other abnormalities of gait and mobility    Problem List Patient Active Problem List   Diagnosis Date Noted   Familial hypercholesteremia 11/06/2021   PFO (patent foramen ovale) 11/06/2021   Urinary retention 09/27/2021   Diplopia 09/27/2021   Carotid stenosis, right 09/27/2021   Anemia 09/27/2021   Slow transit constipation    Benign essential HTN  Cerebellar stroke ([place]) 09/04/2021   Acute ischemic stroke [facility])    Cerebral embolism with cerebral infarction 08/30/2021   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 [place], [place], [address] Phone: [phone]   Fax:  [phone]  Name: Custer Pimenta MRN: [id number] Date of Birth: 04-Mar-1956  Vianne Bulls, OTR/L [facility] [address] [place], [place]  [address] [phone] phone [phone] 12/25/21 10:41 AM

## 2021-12-25 NOTE — Therapy (Addendum)
[facility] [address] [place], [place], [address] Phone: [phone]   Fax:  [phone]  Physical Therapy Treatment  Patient Details  Name: Maurice Little MRN: [id number] Date of Birth: 05/08/1956 Referring Provider (PT): Lauraine Rinne, PA-C   Encounter Date: 12/25/2021   PT End of Session - 12/25/21 2149     Visit Number 19    Number of Visits 25    Date for PT Re-Evaluation 01/11/22    Authorization Type UHC ([place] with no auth required)    PT Start Time 1145    PT Stop Time 1230    PT Time Calculation (min) 45 min    Activity Tolerance Patient tolerated treatment well    Behavior During Therapy [facility] for tasks assessed/performed             Past Medical History:  Diagnosis Date   Benign prostatic hyperplasia with elevated prostate specific antigen (PSA)    Hypertension    Iliac artery stenosis, left ([place])    with claudication resolved with stent   PAD (peripheral artery disease) ([place])    Stroke [facility])     Past Surgical History:  Procedure Laterality Date   BUBBLE STUDY  08/31/2021   Procedure: BUBBLE STUDY;  Surgeon: Skeet Latch, MD;  Location: [place];  Service: Cardiovascular;;   ILIAC ARTERY STENT Left    IR ANGIO VERTEBRAL SEL SUBCLAVIAN INNOMINATE UNI L MOD SED  08/30/2021   IR ANGIO VERTEBRAL SEL SUBCLAVIAN INNOMINATE UNI R MOD SED  08/30/2021   IR CT HEAD LTD  08/30/2021   IR INTRA CRAN STENT  08/30/2021   IR RADIOLOGIST EVAL & MGMT  11/28/2021   IR US GUIDE VASC ACCESS RIGHT  08/30/2021   RADIOLOGY WITH ANESTHESIA N/A 08/30/2021   Procedure: IR WITH ANESTHESIA;  Surgeon: Luanne Bras, MD;  Location: [place];  Service: Radiology;  Laterality: N/A;   TEE WITHOUT CARDIOVERSION N/A 08/31/2021   Procedure: TRANSESOPHAGEAL ECHOCARDIOGRAM (TEE);  Surgeon: Skeet Latch, MD;  Location: [facility];  Service: Cardiovascular;  Laterality: N/A;    There were no vitals filed for this  visit.   Subjective Assessment - 12/25/21 2147     Subjective Just came from OT at clinic.  Asking if he can set up a couple more aquatic sessions; can be earlier in the morning.  Doesn't need as much time to transition from clinic > pool.  Wants exercises he can do at his pool when weather warms up.    Pertinent History Basilar artery stenosis, urinary retention with indwelling catheter, neurogenic bladder, diplopia, Rt cerebellar infarct, HTN, Rt carotid stenosis, anemia, s/p cerebral angiogram with stent    Limitations Lifting;Standing;Walking;House hold activities;Reading    Patient Stated Goals "I want to be normal again - walk without walker", decrease numbness on Rt side of body    Pain Onset 1 to 4 weeks ago             Patient seen for aquatic therapy today.  Treatment took place in water 3-4 feet deep depending upon activity.  Pt entered and exited the pool via stairs with UE support on rails with supervision-min guard.  Holding Aqua Jogger barbell in bilat UE performed gait training forwards across width of pool x 3 reps with PT in front of patient providing resistance to Aqua Jogger to slow gait velocity and providing cues for pt to maintain upright posture, to decrease BOS and increase step and stride length.  Pt continues to lean  COG forwards over widened BOS with shorter step/stride length.  Intermittent max A to recover balance due to intermittent lateral LOB to L.  Performed controlled anterior weight shifting and gastroc strengthening with heel raises x 12 reps with UE support on edge of pool. Performed coordination training holding aqua dumbbell in each hand and keeping dumbbells under the water while performing "cross country skiing" with reciprocal, contralateral UE and LE advancement forwards across pool x 3 reps with PT in front facilitating alternating sequence and for balance recovery when needed.  Changed to R/L side stepping while in squat across width of pool x 3 laps  with UE abducted and using aqua dumbbells for support/balance.  PT provided cues for full lateral weight shifting and balance recovery when bringing feet together.  Changed to [place] to L and R x 3 laps for crossing midline with UE support on aqua dumbbells.    Performed tall kneeling on submerged step without UE support: performed 10 reps tall kneeling squats in midline + 10 alternating with pelvic bias to L or R with supervision-min A with intermittent rest breaks due to L hamstring cramping.  Performed standing L and R hamstring stretch with heel on submerged step.  Performed swimming with breast stroke across length of pool x 3 reps with PT providing supervision with equal use of each LE; pt noted to kick more with LE during last lap.  Pt requires buoyancy of water for support for joint offloading for body weight support and proprioception, to reduce fall risk with gait training and balance exercises with minimal UE support; exercises able to be performed safely in water without the risk of fall compared to those same exercises performed on land;  viscosity of water needed for resistance for strengthening.  Current of water provides perturbations for challenging static & dynamic standing balance.    PT Short Term Goals - 12/13/21 2585       PT SHORT TERM GOAL #1   Title Pt will perform basic transfers wheelchair to/from mat using squat pivot transfer with supervision.  Upgrade - Perform basic transfers modified independently. (Target date all STG's 12-14-21)    Baseline performed with supervision in previous session; met 12-13-21    Time 4    Period Weeks    Status Achieved    Target Date 12/14/21      PT SHORT TERM GOAL #2   Title Pt will improve Berg score from 27/56 to >/= 32/56 to demo improved balance with reduced fall risk.  Upgraded goal - increase score to >/= 40/56    Baseline 27/56; 35/56 11-15-21     score 35/56 on 12-13-21    Time 4    Period Weeks    Status Not Met    Target  Date 12/14/21      PT SHORT TERM GOAL #3   Title Improve TUG score from 1".07 secs to </= 50 secs with RW with CGA.;  Upgraded goal - improve score from 31.75 secs to </= 25 secs with RW    Baseline 1" 7 secs with RW; 31.75 seconds on 11/13/21;  33.31 with RW on 12-13-21    Time 4    Period Weeks    Status Not Met    Target Date 12/14/21      PT SHORT TERM GOAL #4   Title Perform sit to stand from mat to RW with SBA with normal BOS. Upgraded goal - perform sit to stand 5 times without UE support without LOB  with SBA    Baseline met with using BUE support; met 12-13-21    Time 4    Period Weeks    Status Achieved    Target Date 12/14/21      PT SHORT TERM GOAL #5   Title Amb. 88' with RW with CGA on flat, even surface. Upgraded goal 30' with RW with SBA on flat, even surface    Baseline 230' with CGA with RW on 11/13/21;  350' with RW with SBA on 12-13-21    Time 4    Period Weeks    Status Achieved    Target Date 12/14/21      PT SHORT TERM GOAL #6   Title Perform HEP for standing balance with wife's assistance.    Baseline new balance exercises issued on 11-15-21; pt reports not doing balance exercises regularly - 12-13-21    Time 4    Period Weeks    Status On-going    Target Date 12/14/21               PT Long Term Goals - 12/14/21 0908       PT LONG TERM GOAL #1   Title Pt will be modified independent with household amb. without use of assistive device.    Time 12    Period Weeks    Status New    Target Date 01/11/22      PT LONG TERM GOAL #2   Title Pt will amb. 1000' with SPC with SBA on flat even and uneven surfaces for increased community accessibility.    Time 12    Period Weeks    Status New    Target Date 01/11/22      PT LONG TERM GOAL #3   Title Improve TUG score to </= 25 secs with appropriate assistive device to demo improved functional mobility and to reduce fall risk.    Baseline 1".07 secs with RW - 10-16-21    Time 12    Period Weeks     Status New    Target Date 01/11/22      PT LONG TERM GOAL #4   Title Increase Berg score to >/= 45/56 to reduce fall risk.    Baseline 27/56    Time 12    Period Weeks    Status New    Target Date 01/11/22      PT LONG TERM GOAL #5   Title Increase gait velocity from .43 ft/sec to >/= 1.6 ft/sec with RW for increased gait efficiency.    Time 12    Period Weeks    Status New    Target Date 01/11/22      PT LONG TERM GOAL #6   Title Independent in updated HEP as appropriate.    Time 12    Period Weeks    Status New    Target Date 01/11/22                   Plan - 12/26/21 1228     Clinical Impression Statement Aquatic environment utilized to continue to address dynamic balance and gait impairments and coordination.  At end of session pt was able to perform swimming across length of pool x 3 reps utilizing bilat UE and slight flutter kick with LE with supervision.  Will continue to address and progress towards LTG.    Personal Factors and Comorbidities Comorbidity 2;Profession    Comorbidities basilar artery stenosis, dyslipidemia,  HTN, neurogenic bladder, diplopia, anemia, Lt  iliac artery stenosis, PAD    Examination-Activity Limitations Bathing;Carry;Continence;Toileting;Dressing;Lift;Stand;Stairs;Squat;Reach Overhead;Locomotion Level;Transfers    Examination-Participation Restrictions Cleaning;Community Activity;Driving;Interpersonal Relationship;Laundry;Shop;Occupation;Yard Work;Meal Prep    Stability/Clinical Decision Making Evolving/Moderate complexity    Rehab Potential Good    PT Frequency 2x / week    PT Duration 12 weeks    PT Treatment/Interventions ADLs/Self Care Home Management;Aquatic Therapy;DME Instruction;Gait training;Stair training;Therapeutic activities;Therapeutic exercise;Balance training;Neuromuscular re-education;Patient/family education;Vestibular    PT Next Visit Plan Please print out new schedule, I added more pool and took away two land PT  sessions.  LAND =  Cont with gait without RW, trunk/core stabilization exercises and standing balance exs., coordination exercises esp for RLE.  POOL = swimming laps.  gait without UE support, balance with narrow BOS, tall kneeling, coordination, SLS, step ups    PT Home Exercise Plan [id number]    Consulted and Agree with Plan of Care Patient;Family member/caregiver    Family Member Consulted wife, Maurice Little             Patient will benefit from skilled therapeutic intervention in order to improve the following deficits and impairments:  Abnormal gait, Decreased activity tolerance, Decreased balance, Decreased coordination, Dizziness, Impaired sensation, Impaired UE functional use, Impaired vision/preception  Visit Diagnosis: Ataxic gait  Other abnormalities of gait and mobility  Other lack of coordination  Unsteadiness on feet  Other symptoms and signs involving the nervous system     Problem List Patient Active Problem List   Diagnosis Date Noted   Familial hypercholesteremia 11/06/2021   PFO (patent foramen ovale) 11/06/2021   Urinary retention 09/27/2021   Diplopia 09/27/2021   Carotid stenosis, right 09/27/2021   Anemia 09/27/2021   Slow transit constipation    Benign essential HTN    Cerebellar stroke ([place]) 09/04/2021   Acute ischemic stroke (HCC)    Cerebral embolism with cerebral infarction 08/30/2021   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

## 2021-12-26 ENCOUNTER — Ambulatory Visit (HOSPITAL_COMMUNITY)
Admission: RE | Admit: 2021-12-26 | Discharge: 2021-12-26 | Disposition: A | Discharge status: Home / Self Care | Payer: 59 | Source: Ambulatory Visit | Attending: Interventional Radiology | Admitting: Interventional Radiology

## 2021-12-26 ENCOUNTER — Encounter (HOSPITAL_COMMUNITY): Payer: Self-pay

## 2021-12-26 DIAGNOSIS — I771 Stricture of artery: Secondary | ICD-10-CM | POA: Diagnosis not present

## 2021-12-26 LAB — POCT I-STAT CREATININE: Creatinine, Ser: 1 mg/dL (ref 0.61–1.24)

## 2021-12-26 MED ORDER — SODIUM CHLORIDE (PF) 0.9 % IJ SOLN
INTRAMUSCULAR | Status: AC
  Filled 2021-12-26: qty 50

## 2021-12-26 MED ORDER — IOHEXOL 350 MG/ML SOLN
100.0000 mL | Freq: Once | INTRAVENOUS | Status: AC | PRN
  Administered 2021-12-26: 75 mL via INTRAVENOUS

## 2021-12-27 ENCOUNTER — Ambulatory Visit: Payer: 59 | Attending: Family Medicine | Admitting: Physical Therapy

## 2021-12-27 ENCOUNTER — Ambulatory Visit: Payer: 59 | Admitting: Occupational Therapy

## 2021-12-27 ENCOUNTER — Other Ambulatory Visit: Payer: Self-pay

## 2021-12-27 ENCOUNTER — Encounter: Payer: Self-pay | Admitting: Occupational Therapy

## 2021-12-27 DIAGNOSIS — R278 Other lack of coordination: Secondary | ICD-10-CM | POA: Diagnosis not present

## 2021-12-27 DIAGNOSIS — R208 Other disturbances of skin sensation: Secondary | ICD-10-CM

## 2021-12-27 DIAGNOSIS — R2689 Other abnormalities of gait and mobility: Secondary | ICD-10-CM | POA: Insufficient documentation

## 2021-12-27 DIAGNOSIS — R29818 Other symptoms and signs involving the nervous system: Secondary | ICD-10-CM | POA: Diagnosis present

## 2021-12-27 DIAGNOSIS — R41842 Visuospatial deficit: Secondary | ICD-10-CM | POA: Diagnosis present

## 2021-12-27 DIAGNOSIS — M25511 Pain in right shoulder: Secondary | ICD-10-CM | POA: Diagnosis present

## 2021-12-27 DIAGNOSIS — R260 Ataxic gait: Secondary | ICD-10-CM | POA: Insufficient documentation

## 2021-12-27 DIAGNOSIS — R2681 Unsteadiness on feet: Secondary | ICD-10-CM | POA: Insufficient documentation

## 2021-12-27 DIAGNOSIS — R270 Ataxia, unspecified: Secondary | ICD-10-CM

## 2021-12-27 NOTE — Therapy (Addendum)
OUTPATIENT PHYSICAL THERAPY TREATMENT NOTE   Patient Name: Maurice Little MRN: [id number] DOB:[birth year], 66 y.o., male Today's Date: 12/28/2021  PCP: Tennis Must [place], Blondell Reveal, MD REFERRING PROVIDER: de [place], Raymond J, MD   PT End of Session - 12/28/21 [phone]     Visit Number 20    Number of Visits 25    Date for PT Re-Evaluation 01/11/22    Authorization Type UHC ([place] with no auth required)    PT Start Time 0935    PT Stop Time 1017    PT Time Calculation (min) 42 min    Equipment Utilized During Treatment Gait belt    Activity Tolerance Patient tolerated treatment well    Behavior During Therapy WFL for tasks assessed/performed             Past Medical History:  Diagnosis Date   Benign prostatic hyperplasia with elevated prostate specific antigen (PSA)    Hypertension    Iliac artery stenosis, left (HCC)    with claudication resolved with stent   PAD (peripheral artery disease) ([place])    Stroke [facility])    Past Surgical History:  Procedure Laterality Date   BUBBLE STUDY  08/31/2021   Procedure: BUBBLE STUDY;  Surgeon: Skeet Latch, MD;  Location: [place];  Service: Cardiovascular;;   ILIAC ARTERY STENT Left    IR ANGIO VERTEBRAL SEL SUBCLAVIAN INNOMINATE UNI L MOD SED  08/30/2021   IR ANGIO VERTEBRAL SEL SUBCLAVIAN INNOMINATE UNI R MOD SED  08/30/2021   IR CT HEAD LTD  08/30/2021   IR INTRA CRAN STENT  08/30/2021   IR RADIOLOGIST EVAL & MGMT  11/28/2021   IR US GUIDE VASC ACCESS RIGHT  08/30/2021   RADIOLOGY WITH ANESTHESIA N/A 08/30/2021   Procedure: IR WITH ANESTHESIA;  Surgeon: Luanne Bras, MD;  Location: [place];  Service: Radiology;  Laterality: N/A;   TEE WITHOUT CARDIOVERSION N/A 08/31/2021   Procedure: TRANSESOPHAGEAL ECHOCARDIOGRAM (TEE);  Surgeon: Skeet Latch, MD;  Location: [place];  Service: Cardiovascular;  Laterality: N/A;   Patient Active Problem List   Diagnosis Date Noted   Familial hypercholesteremia 11/06/2021   PFO (patent foramen  ovale) 11/06/2021   Urinary retention 09/27/2021   Diplopia 09/27/2021   Carotid stenosis, right 09/27/2021   Anemia 09/27/2021   Slow transit constipation    Benign essential HTN    Cerebellar stroke ([place]) 09/04/2021   Acute ischemic stroke (HCC)    Cerebral embolism with cerebral infarction 08/30/2021   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 No   TODAY'S TREATMENT:  12/27/21 NMR:  Pt transferred sitting to tall kneeling position on floor (on red mat); transferred to quadruped with CGA; pt performed hip extension 1 rep each leg with CGA for balance; then performed contralateral UE flexion/ LE extension (bird dog pose) for 3 sec hold 3 reps each side; Pt perfomed hip abdct./ adduction 3 reps  each leg in tall kneeling position with LUE support on mat; then performed hip flexion/extension 3 reps each leg in tall kneeling with LUE support on mat Small range mini squats in tall kneeling 5 reps Pt performed touching 3 colored balance discs for improved SLS on each leg - min to mod assist needed for balance recovery  Gait; no device used - 40' to amb. From mat to // bars due to fatigue and decreased balance Pt gait trained inside // bars without UE support, but did hold with 1 hand with turning Amb. From // bars to mat 40' with min assist without device    PATIENT EDUCATION: Education  details: Reviewed HEP Person educated: Patient Education method: Explanation Education comprehension: verbalized understanding      PT Short Term Goals - 12/13/21 0938       PT SHORT TERM GOAL #1   Title Pt will perform basic transfers wheelchair to/from mat using squat pivot transfer with supervision.  Upgrade - Perform basic transfers modified independently. (Target date all STG's 12-14-21)    Baseline performed with supervision in previous session; met 12-13-21    Time 4    Period Weeks    Status Achieved    Target Date 12/14/21      PT SHORT TERM GOAL #2   Title Pt will improve Berg score from 27/56 to >/= 32/56 to demo improved balance with reduced fall risk.  Upgraded goal - increase score to >/= 40/56    Baseline 27/56; 35/56 11-15-21     score 35/56 on 12-13-21    Time 4    Period Weeks    Status Not Met    Target Date 12/14/21      PT SHORT TERM GOAL #3   Title Improve TUG score from 1".07 secs to </= 50 secs with RW with CGA.;  Upgraded goal - improve score from 31.75 secs to </= 25 secs with RW    Baseline 1" 7 secs with RW; 31.75 seconds on 11/13/21;  33.31 with RW on 12-13-21    Time 4    Period Weeks    Status Not Met    Target Date 12/14/21      PT SHORT TERM GOAL #4   Title Perform sit to stand from mat to RW with SBA with normal BOS. Upgraded goal - perform sit to stand 5 times without UE support without LOB with SBA    Baseline met with using BUE support; met 12-13-21    Time 4    Period Weeks    Status Achieved    Target Date 12/14/21      PT SHORT TERM GOAL #5   Title Amb. 25' with RW with CGA on flat, even surface. Upgraded goal 59' with RW with SBA on flat, even surface    Baseline 230' with CGA with RW on 11/13/21;  350' with RW with SBA on 12-13-21    Time 4    Period Weeks    Status Achieved    Target Date 12/14/21      PT SHORT TERM GOAL #6   Title Perform HEP for standing balance with wife's assistance.    Baseline new balance exercises issued  on 11-15-21; pt reports not doing balance exercises regularly - 12-13-21    Time 4    Period Weeks    Status On-going    Target Date 12/14/21              PT Long Term Goals -  12/14/21 0908       PT LONG TERM GOAL #1   Title Pt will be modified independent with household amb. without use of assistive device.    Time 12    Period Weeks    Status New    Target Date 01/11/22      PT LONG TERM GOAL #2   Title Pt will amb. 1000' with SPC with SBA on flat even and uneven surfaces for increased community accessibility.    Time 12    Period Weeks    Status New    Target Date 01/11/22      PT LONG TERM GOAL #3   Title Improve TUG score to </= 25 secs with appropriate assistive device to demo improved functional mobility and to reduce fall risk.    Baseline 1".07 secs with RW - 10-16-21    Time 12    Period Weeks    Status New    Target Date 01/11/22      PT LONG TERM GOAL #4   Title Increase Berg score to >/= 45/56 to reduce fall risk.    Baseline 27/56    Time 12    Period Weeks    Status New    Target Date 01/11/22      PT LONG TERM GOAL #5   Title Increase gait velocity from .43 ft/sec to >/= 1.6 ft/sec with RW for increased gait efficiency.    Time 12    Period Weeks    Status New    Target Date 01/11/22      PT LONG TERM GOAL #6   Title Independent in updated HEP as appropriate.    Time 12    Period Weeks    Status New    Target Date 01/11/22               12/28/21 4174  Plan  Clinical Impression Statement Session focused on core stabilization with activities performed in tall kneeling & quadruped positions followed by standing balance activities & gait training without use of device.  Pt was very fatigued after completion of core stabilization exs. in tall kneeling & quadruped position on mat on floor, stating his quads were extremely tired.  Standing balance & gait training without device was very limited due to c/o fatigue.  Pt. able to amb. only  approx. 72' without device to // bars for additional 20' x 2 reps inside // bars to have UE support prn.  Pt needed min to mod assist for SLS balance activity (touching target) due to unsteadiness & decreased postural control due to fatigue.  Pt stated that his quads were "burning" after mat exercises, resulting in ability to tolerate standing balance exercises & gait training in today's session.  Cont with POC.  Personal Factors and Comorbidities Comorbidity 2;Profession  Comorbidities basilar artery stenosis, dyslipidemia,  HTN, neurogenic bladder, diplopia, anemia, Lt iliac artery stenosis, PAD  Examination-Activity Limitations Bathing;Carry;Continence;Toileting;Dressing;Lift;Stand;Stairs;Squat;Reach Overhead;Locomotion Level;Transfers  Examination-Participation Restrictions Cleaning;Community Activity;Driving;Interpersonal Relationship;Laundry;Shop;Occupation;Yard Work;Meal Prep  Pt will benefit from skilled therapeutic intervention in order to improve on the following deficits Abnormal gait;Decreased activity tolerance;Decreased balance;Decreased coordination;Dizziness;Impaired sensation;Impaired UE functional use;Impaired vision/preception  Stability/Clinical Decision Making Evolving/Moderate complexity  Rehab Potential Good  PT Frequency 2x / week  PT Duration 12 weeks  PT Treatment/Interventions ADLs/Self Care Home Management;Aquatic Therapy;DME Instruction;Gait training;Stair training;Therapeutic activities;Therapeutic exercise;Balance training;Neuromuscular re-education;Patient/family education;Vestibular  PT Next Visit Plan LAND =  Cont with gait without RW, trunk/core stabilization exercises and standing balance exs., coordination exercises  esp for RLE.  POOL = swimming laps.  gait without UE support, balance with narrow BOS, tall kneeling, coordination, SLS, step ups  PT Home Exercise Plan [id number]  Consulted and Agree with Plan of Care Patient;Family member/caregiver  Family Member  Consulted wife, Elsie Saas, [place] 12/28/2021, 9:08 AM

## 2021-12-27 NOTE — Therapy (Signed)
[facility] [address] [place], [place], [address] Phone: [phone]   Fax:  [phone]  Occupational Therapy Treatment  Patient Details  Name: Maurice Little MRN: [id number] Date of Birth: 1955-11-11 Referring Provider (OT): Lauraine Rinne, PA-C   Encounter Date: 12/27/2021   OT End of Session - 12/27/21 1029     Visit Number 24    Number of Visits 32   24+8=32   Date for OT Re-Evaluation 01/26/22    Authorization Type UHC--no visit limit/no auth, copay per day    Authorization Time Period renewal completed 12/27/21    OT Start Time 1022    OT Stop Time 1100    OT Time Calculation (min) 38 min    Activity Tolerance Patient tolerated treatment well    Behavior During Therapy [facility] for tasks assessed/performed             Past Medical History:  Diagnosis Date   Benign prostatic hyperplasia with elevated prostate specific antigen (PSA)    Hypertension    Iliac artery stenosis, left ([place])    with claudication resolved with stent   PAD (peripheral artery disease) ([place])    Stroke [facility])     Past Surgical History:  Procedure Laterality Date   BUBBLE STUDY  08/31/2021   Procedure: BUBBLE STUDY;  Surgeon: Skeet Latch, MD;  Location: [place];  Service: Cardiovascular;;   ILIAC ARTERY STENT Left    IR ANGIO VERTEBRAL SEL SUBCLAVIAN INNOMINATE UNI L MOD SED  08/30/2021   IR ANGIO VERTEBRAL SEL SUBCLAVIAN INNOMINATE UNI R MOD SED  08/30/2021   IR CT HEAD LTD  08/30/2021   IR INTRA CRAN STENT  08/30/2021   IR RADIOLOGIST EVAL & MGMT  11/28/2021   IR US GUIDE VASC ACCESS RIGHT  08/30/2021   RADIOLOGY WITH ANESTHESIA N/A 08/30/2021   Procedure: IR WITH ANESTHESIA;  Surgeon: Luanne Bras, MD;  Location: [place];  Service: Radiology;  Laterality: N/A;   TEE WITHOUT CARDIOVERSION N/A 08/31/2021   Procedure: TRANSESOPHAGEAL ECHOCARDIOGRAM (TEE);  Surgeon: Skeet Latch, MD;  Location: [place];  Service:  Cardiovascular;  Laterality: N/A;    There were no vitals filed for this visit.   Subjective Assessment - 12/27/21 1027     Subjective  Pt reports "PT wore me out"    Pertinent History CVA (R cereballar and R small R brachium potis and R pontomedullary junction).   PMH:  dyslipidemia, HTN, carotid stenosis, anemia, PAD    Limitations ataxia, diplopia, folley catheter, decr hearing (particularly R side)    Patient Stated Goals incr independence, get better    Currently in Pain? Yes    Pain Score 1     Pain Location Shoulder    Pain Orientation Right    Pain Descriptors / Indicators Aching    Pain Type Acute pain    Pain Onset More than a month ago    Pain Frequency Intermittent    Aggravating Factors  using it    Pain Relieving Factors keeping shoulder down              Placing [place]'connor pegs in pegboard with tweezers with min-mod cueing for shoulder hike and min cueing for coordination/technique.  Placing Perfection pieces in game board with min cueing for shoulder hike for incr coordination.  Standing, functional reaching to place clothespins with 1-8lb resistance on vertical pole for incr coordination, functional reach (min cueing for compensation) and wt. Shift to the R/balance for IADLs with  min cueing and supervision.  Writing 2 sentences with min cueing for shoulder position and 75% legibility.            OT Short Term Goals - 12/04/21 0854       OT SHORT TERM GOAL #1   Title Pt will be independent with initial HEP.--check STGs 11/02/21    Time 4    Period Weeks    Status Achieved      OT SHORT TERM GOAL #2   Title Pt will perform BADLs mod I except supervision for shower transfer.    Time 4    Period Weeks    Status Achieved   min A for back, supervision for standing to pull up pants.  11/20/21:  supervision for ambulation for toileting (mod I for tolieting at night with [facility]), supervision for shower transfer.  12/04/21:  now ambulating to restroom mod I      OT SHORT TERM GOAL #3   Title Pt will perform tabletop visual scanning with 90% accuracy and no reports of diplopia.    Time 4    Period Weeks    Status Achieved      OT SHORT TERM GOAL #4   Title Pt will verbalize understanding of visual compensation strategies for ADLs/IADLs prn (including ?taping, head turns/positioning)    Time 4    Period Weeks    Status Achieved   Pt/ wife verbalize understanding     OT SHORT TERM GOAL #5   Title Pt will improve coordination for ADLs as shown by improving time on 9-hole peg test by at least 5sec bilaterally.    Baseline R-66.85sec, L-41.25sec    Time 4    Period Weeks    Status Achieved   R 65.25-not met yet improvedd, L 33.60- met partially met, met for LUE.  11/20/21:  R-51.75sec              OT Long Term Goals - 12/27/21 1030       OT LONG TERM GOAL #1   Title Pt will be independent with updated HEP.    Time 12    Period Weeks    Status On-going   12/25/21:  not fully met/not performing consistently     OT LONG TERM GOAL #2   Title Pt will perform simple snack prep/home maintenance task with supervision.--11/29/21 met initial goal, updated 11/29/21:  Pt will peform simple cooking and home maintenance tasks mod I.    Time 12    Period Weeks    Status On-going   12/25/21:  ongoing, met initial goal, but has not met updated goal     OT LONG TERM GOAL #3   Title Pt will perform environmental scanning/navigation with at least 90% accuracy for incr safety in the home/community.    Time 12    Period Weeks    Status On-going   12/18/21:  CGA with 93% accuracy in mod distracting environment     OT LONG TERM GOAL #4   Title Pt will improve RUE coordination as shown by improving time on 9-hole peg test by at least 15sec.--met.  11/27/21 upgraded:  Pt will complete 9-hole peg test in 45sec or less with RUE.    Baseline 66.85sec    Time 12    Period Weeks    Status Revised   12/25/21:  55.57sec     OT LONG TERM GOAL #5   Title Pt will be  able to retrieve/replace 1-2 objects on overhead shelf  with each RUE x10 safetly and with good control for IADL tasks without pain.    Time 12    Period Weeks    Status Revised   12/25/21:  can place lightweight objects <1lb, but needs cueing/facilitation for positioning and reports 1-2/10 R shoulder pain, able to perform with LUE (revised 12/25/21)     OT LONG TERM GOAL #6   Title Pt will be able to type with at least 80% accuracy with at least 20wpm in prep for work tasks.    Baseline 39% with 10wpm    Time 12    Period Weeks    Status Deferred   pt now not planning to go back to work     OT [place] #7   Title Pt will be able to write at least 4 sentences with 100% legibility in reasonable amount of time.    Time 12    Period Weeks    Status On-going   11/29/21:  approx 90% legibility.  12/25/21:  approx 75% legibility                  Plan - 12/27/21 1029     Clinical Impression Statement Pt continues to make progress towards long term goals (all STGs met) and 3 LTGs met/revised.  Pt would benefit from continued occupational therapy to continue to incr safety/independence and improve RUE functional use.    OT Occupational Profile and History Detailed Assessment- Review of Records and additional review of physical, cognitive, psychosocial history related to current functional performance    Occupational performance deficits (Please refer to evaluation for details): ADL's;IADL's;Work;Leisure    Body Structure / Function / Physical Skills ADL;UE functional use;Balance;FMC;Coordination;Sensation;IADL;Decreased knowledge of use of DME;Dexterity;Vision    Rehab Potential Good    Clinical Decision Making Several treatment options, min-mod task modification necessary    Comorbidities Affecting Occupational Performance: May have comorbidities impacting occupational performance    Modification or Assistance to Complete Evaluation  Min-Moderate modification of tasks or assist with  assess necessary to complete eval    OT Frequency 2x / week    OT Duration 12 weeks   +eval   OT Treatment/Interventions Self-care/ADL training;Energy conservation;Ultrasound;Visual/perceptual remediation/compensation;Patient/family education;DME and/or AE instruction;Aquatic Therapy;Paraffin;Passive range of motion;Balance training;Cryotherapy;Functional Mobility Training;Moist Heat;Therapeutic exercise;Manual Therapy;Therapeutic activities;Neuromuscular education    Plan renewal completed 12/27/21; continue to address IADL performance/balance, coordination, and R shoulder pain/stability prn    OT Home Exercise Plan Pt reports that he has some coordination activities and visual HEP from hospital    Consulted and Agree with Plan of Care Patient;Family member/caregiver    Family Member Consulted wife             Patient will benefit from skilled therapeutic intervention in order to improve the following deficits and impairments:   Body Structure / Function / Physical Skills: ADL, UE functional use, Balance, FMC, Coordination, Sensation, IADL, Decreased knowledge of use of DME, Dexterity, Vision       Visit Diagnosis: Other lack of coordination  Unsteadiness on feet  Visuospatial deficit  Other disturbances of skin sensation  Ataxia  Other abnormalities of gait and mobility  Acute pain of right shoulder    Problem List Patient Active Problem List   Diagnosis Date Noted   Familial hypercholesteremia 11/06/2021   PFO (patent foramen ovale) 11/06/2021   Urinary retention 09/27/2021   Diplopia 09/27/2021   Carotid stenosis, right 09/27/2021   Anemia 09/27/2021   Slow transit constipation    Benign  essential HTN    Cerebellar stroke ([place]) 09/04/2021   Acute ischemic stroke [facility])    Cerebral embolism with cerebral infarction 08/30/2021   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 [place], [place], [address] Phone: [phone]   Fax:  [phone]  Name: Maurice Little MRN: [id number] Date of Birth: 03-30-1956  Vianne Bulls, OTR/L [facility] [address] [place], [place]  [address] [phone] phone [phone] 12/27/21 12:22 PM

## 2021-12-28 ENCOUNTER — Encounter: Payer: Self-pay | Admitting: Physical Therapy

## 2021-12-28 NOTE — Progress Notes (Signed)
?   12/28/21 6010  ?Plan  ?Personal Factors and Comorbidities Comorbidity 2;Profession  ?Comorbidities basilar artery stenosis, dyslipidemia,  HTN, neurogenic bladder, diplopia, anemia, Lt iliac artery stenosis, PAD  ?Examination-Activity Limitations Bathing;Carry;Continence;Toileting;Dressing;Lift;Stand;Stairs;Squat;Reach Overhead;Locomotion Level;Transfers  ?Examination-Participation Restrictions Cleaning;Community Activity;Driving;Interpersonal Relationship;Laundry;Shop;Occupation;Yard Work;Meal Prep  ?Pt will benefit from skilled therapeutic intervention in order to improve on the following deficits Abnormal gait;Decreased activity tolerance;Decreased balance;Decreased coordination;Dizziness;Impaired sensation;Impaired UE functional use;Impaired vision/preception  ?Stability/Clinical Decision Making Evolving/Moderate complexity  ?Rehab Potential Good  ?PT Frequency 2x / week  ?PT Duration 12 weeks  ?PT Treatment/Interventions ADLs/Self Care Home Management;Aquatic Therapy;DME Instruction;Gait training;Stair training;Therapeutic activities;Therapeutic exercise;Balance training;Neuromuscular re-education;Patient/family education;Vestibular  ?PT Next Visit Plan [place] =  Cont with gait without RW, trunk/core stabilization exercises and standing balance exs., coordination exercises esp for RLE.  POOL = swimming laps.  gait without UE support, balance with narrow BOS, tall kneeling, coordination, SLS, step ups  ?PT Home Exercise Plan [id number]  ?Consulted and Agree with Plan of Care Patient;Family member/caregiver  ?Family Member Consulted wife, [name]  ? ? ?

## 2021-12-29 ENCOUNTER — Other Ambulatory Visit (HOSPITAL_BASED_OUTPATIENT_CLINIC_OR_DEPARTMENT_OTHER): Payer: Self-pay | Admitting: Family Medicine

## 2021-12-31 ENCOUNTER — Other Ambulatory Visit (HOSPITAL_COMMUNITY): Payer: Self-pay | Admitting: Interventional Radiology

## 2021-12-31 DIAGNOSIS — I771 Stricture of artery: Secondary | ICD-10-CM

## 2022-01-01 ENCOUNTER — Ambulatory Visit: Payer: 59 | Admitting: Physical Therapy

## 2022-01-01 ENCOUNTER — Telehealth (HOSPITAL_COMMUNITY): Payer: Self-pay

## 2022-01-01 ENCOUNTER — Encounter: Payer: Self-pay | Admitting: Occupational Therapy

## 2022-01-01 ENCOUNTER — Ambulatory Visit: Payer: 59 | Admitting: Occupational Therapy

## 2022-01-01 ENCOUNTER — Other Ambulatory Visit: Payer: Self-pay

## 2022-01-01 DIAGNOSIS — R270 Ataxia, unspecified: Secondary | ICD-10-CM

## 2022-01-01 DIAGNOSIS — R29818 Other symptoms and signs involving the nervous system: Secondary | ICD-10-CM

## 2022-01-01 DIAGNOSIS — R260 Ataxic gait: Secondary | ICD-10-CM

## 2022-01-01 DIAGNOSIS — R278 Other lack of coordination: Secondary | ICD-10-CM

## 2022-01-01 DIAGNOSIS — R2689 Other abnormalities of gait and mobility: Secondary | ICD-10-CM

## 2022-01-01 DIAGNOSIS — R2681 Unsteadiness on feet: Secondary | ICD-10-CM

## 2022-01-01 DIAGNOSIS — R41842 Visuospatial deficit: Secondary | ICD-10-CM

## 2022-01-01 DIAGNOSIS — R208 Other disturbances of skin sensation: Secondary | ICD-10-CM

## 2022-01-01 NOTE — Therapy (Signed)
OUTPATIENT PHYSICAL THERAPY TREATMENT NOTE   Patient Name: Maurice Little MRN: [id number] DOB:26-Sep-1956, 66 y.o., male Today's Date: 01/01/2022  PCP: Tennis Must [place], Blondell Reveal, MD REFERRING PROVIDER: de [place], Raymond J, MD   PT End of Session - 01/01/22 2054     Visit Number 21    Number of Visits 25    Date for PT Re-Evaluation 01/11/22    Authorization Type UHC ([place] with no auth required)    PT Start Time 1145    PT Stop Time 1230    PT Time Calculation (min) 45 min    Equipment Utilized During Treatment Other (comment)   aqua dumbbells   Activity Tolerance Patient tolerated treatment well    Behavior During Therapy WFL for tasks assessed/performed              Past Medical History:  Diagnosis Date   Benign prostatic hyperplasia with elevated prostate specific antigen (PSA)    Hypertension    Iliac artery stenosis, left (HCC)    with claudication resolved with stent   PAD (peripheral artery disease) ([place])    Stroke [facility])    Past Surgical History:  Procedure Laterality Date   BUBBLE STUDY  08/31/2021   Procedure: BUBBLE STUDY;  Surgeon: Skeet Latch, MD;  Location: [place];  Service: Cardiovascular;;   ILIAC ARTERY STENT Left    IR ANGIO VERTEBRAL SEL SUBCLAVIAN INNOMINATE UNI L MOD SED  08/30/2021   IR ANGIO VERTEBRAL SEL SUBCLAVIAN INNOMINATE UNI R MOD SED  08/30/2021   IR CT HEAD LTD  08/30/2021   IR INTRA CRAN STENT  08/30/2021   IR RADIOLOGIST EVAL & MGMT  11/28/2021   IR US GUIDE VASC ACCESS RIGHT  08/30/2021   RADIOLOGY WITH ANESTHESIA N/A 08/30/2021   Procedure: IR WITH ANESTHESIA;  Surgeon: Luanne Bras, MD;  Location: [place];  Service: Radiology;  Laterality: N/A;   TEE WITHOUT CARDIOVERSION N/A 08/31/2021   Procedure: TRANSESOPHAGEAL ECHOCARDIOGRAM (TEE);  Surgeon: Skeet Latch, MD;  Location: [place];  Service: Cardiovascular;  Laterality: N/A;   Patient Active Problem List   Diagnosis Date Noted   Familial hypercholesteremia  11/06/2021   PFO (patent foramen ovale) 11/06/2021   Urinary retention 09/27/2021   Diplopia 09/27/2021   Carotid stenosis, right 09/27/2021   Anemia 09/27/2021   Slow transit constipation    Benign essential HTN    Cerebellar stroke ([place]) 09/04/2021   Acute ischemic stroke (HCC)    Cerebral embolism with cerebral infarction 08/30/2021   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  Slept really hard after the last aquatic session.  Goes for vestibular testing soon.  PAIN:  Are you having pain? No   TODAY'S TREATMENT:  01/01/2022:  Patient seen for aquatic therapy today.  Treatment took place in water 3-4 feet deep depending upon activity.  Pt entered and exited the pool via stairs with bilat rails, alternating sequence.    Performed gait across width of pool  in 71f holding aqua dumbbells in each UE focusing on more narrow BOS, full step and stride length bilaterally.  Continued to use aqua dumbbells for UE support while performing dynamic balance activities of toe walking across width of pool x 3 laps and then tandem gait x 3 laps.  Performed stationary alternating cross Country ski x 10 reps x 3 sets and low jumping jacks keeping UE under water x 10 reps x 3 sets for aerobic conditioning and coordination.    Balance with feet together - small weight shifts anterior/posterior for ankle strategy training and midline orientation; focused  on maintaining balance with narrow BOS with EO and EC while performing 10 reps head turns/head nods.  Mod A required for safety due to lateral LOB.  Continued aerobic conditioning and coordination with swimming length of pool prone and then supine with body tucks to stand between each set.  Pt experienced cramping in R hamstring muscle - performed standing straight leg hamstring stretch at the wall with no further cramping noted.  Continued swimming but increased challenge by having pt use body tuck to switch between prone <> supine without stopping in standing for a rest break. Also performed transition from prone <> supine through whole body rotation to L and R, multiple repetitions.  Completed session by returning to ambulating the full length of the pool from shallow <> deep without UE support with pt demonstrating increased difficulty maintaining reciprocal UE swing and increased step/stride length as he entered more shallow depths.  Intermittent mod A require to maintain balance in midline during gait.  Pt requires buoyancy of water for support for body weight support during gait training, to reduce fall risk with gait training and balance exercises with minimal UE support; exercises able to be performed safely in water without the risk of fall compared to those same exercises performed on land;  viscosity of water needed for resistance for strengthening.  Current of water provides perturbations for challenging static & dynamic standing balance.    12/27/21: NMR:  Pt transferred sitting to tall kneeling position on floor (on red mat); transferred to quadruped with CGA; pt performed hip extension 1 rep each leg with CGA for balance; then performed contralateral UE flexion/ LE extension (bird dog pose) for 3 sec hold 3 reps each side; Pt perfomed hip abdct./ adduction 3 reps each leg in tall kneeling position with LUE support on mat; then performed hip flexion/extension 3 reps each leg in tall  kneeling with LUE support on mat Small range mini squats in tall kneeling 5 reps Pt performed touching 3 colored balance discs for improved SLS on each leg - min to mod assist needed for balance recovery  Gait; no device used - 40' to amb. From mat to // bars due to fatigue and decreased balance Pt gait trained inside // bars without UE support, but did hold with 1 hand with turning Amb. From // bars to mat 40' with min assist without device    PATIENT EDUCATION: Education details: Will participate in land re-assessment of goals to determine if more visits (land or aquatic) will be indicated. Person educated: Patient Education method: Explanation Education comprehension: verbalized understanding      PT Short Term Goals - 12/13/21 0938       PT SHORT TERM GOAL #1   Title Pt will perform basic transfers wheelchair to/from mat using squat pivot transfer with supervision.  Upgrade - Perform basic transfers modified independently. (Target date all STG's 12-14-21)  Baseline performed with supervision in previous session; met 12-13-21    Time 4    Period Weeks    Status Achieved    Target Date 12/14/21      PT SHORT TERM GOAL #2   Title Pt will improve Berg score from 27/56 to >/= 32/56 to demo improved balance with reduced fall risk.  Upgraded goal - increase score to >/= 40/56    Baseline 27/56; 35/56 11-15-21     score 35/56 on 12-13-21    Time 4    Period Weeks    Status Not Met    Target Date 12/14/21      PT SHORT TERM GOAL #3   Title Improve TUG score from 1".07 secs to </= 50 secs with RW with CGA.;  Upgraded goal - improve score from 31.75 secs to </= 25 secs with RW    Baseline 1" 7 secs with RW; 31.75 seconds on 11/13/21;  33.31 with RW on 12-13-21    Time 4    Period Weeks    Status Not Met    Target Date 12/14/21      PT SHORT TERM GOAL #4   Title Perform sit to stand from mat to RW with SBA with normal BOS. Upgraded goal - perform sit to stand 5 times without UE  support without LOB with SBA    Baseline met with using BUE support; met 12-13-21    Time 4    Period Weeks    Status Achieved    Target Date 12/14/21      PT SHORT TERM GOAL #5   Title Amb. 6' with RW with CGA on flat, even surface. Upgraded goal 72' with RW with SBA on flat, even surface    Baseline 230' with CGA with RW on 11/13/21;  350' with RW with SBA on 12-13-21    Time 4    Period Weeks    Status Achieved    Target Date 12/14/21      PT SHORT TERM GOAL #6   Title Perform HEP for standing balance with wife's assistance.    Baseline new balance exercises issued on 11-15-21; pt reports not doing balance exercises regularly - 12-13-21    Time 4    Period Weeks    Status On-going    Target Date 12/14/21              PT Long Term Goals - 12/14/21 0908       PT LONG TERM GOAL #1   Title Pt will be modified independent with household amb. without use of assistive device.    Time 12    Period Weeks    Status New    Target Date 01/11/22      PT LONG TERM GOAL #2   Title Pt will amb. 1000' with SPC with SBA on flat even and uneven surfaces for increased community accessibility.    Time 12    Period Weeks    Status New    Target Date 01/11/22      PT LONG TERM GOAL #3   Title Improve TUG score to </= 25 secs with appropriate assistive device to demo improved functional mobility and to reduce fall risk.    Baseline 1".07 secs with RW - 10-16-21    Time 12    Period Weeks    Status New    Target Date 01/11/22      PT LONG TERM GOAL #4   Title Increase Merrilee Jansky  score to >/= 45/56 to reduce fall risk.    Baseline 27/56    Time 12    Period Weeks    Status New    Target Date 01/11/22      PT LONG TERM GOAL #5   Title Increase gait velocity from .43 ft/sec to >/= 1.6 ft/sec with RW for increased gait efficiency.    Time 12    Period Weeks    Status New    Target Date 01/11/22      PT LONG TERM GOAL #6   Title Independent in updated HEP as appropriate.    Time  12    Period Weeks    Status New    Target Date 01/11/22               12/28/21 0630  Plan  Clinical Impression Statement Pt demonstrated improvement in aerobic endurance today.  Pt continues to require UE support in order to perform higher level gait and dynamic balance exercises; when performing without UE support he requires increased assistance from PT to maintain balance.  Pt experienced mild dizziness today with repeated head turns/nods and rotations in prone/supine.  Will continue to address and progress towards LTG.  Personal Factors and Comorbidities Comorbidity 2;Profession  Comorbidities basilar artery stenosis, dyslipidemia,  HTN, neurogenic bladder, diplopia, anemia, Lt iliac artery stenosis, PAD  Examination-Activity Limitations Bathing;Carry;Continence;Toileting;Dressing;Lift;Stand;Stairs;Squat;Reach Overhead;Locomotion Level;Transfers  Examination-Participation Restrictions Cleaning;Community Activity;Driving;Interpersonal Relationship;Laundry;Shop;Occupation;Yard Work;Meal Prep  Pt will benefit from skilled therapeutic intervention in order to improve on the following deficits Abnormal gait;Decreased activity tolerance;Decreased balance;Decreased coordination;Dizziness;Impaired sensation;Impaired UE functional use;Impaired vision/preception  Stability/Clinical Decision Making Evolving/Moderate complexity  Rehab Potential Good  PT Frequency 2x / week  PT Duration 12 weeks  PT Treatment/Interventions ADLs/Self Care Home Management;Aquatic Therapy;DME Instruction;Gait training;Stair training;Therapeutic activities;Therapeutic exercise;Balance training;Neuromuscular re-education;Patient/family education;Vestibular  PT Next Visit Plan LAND =  Cont with gait without RW, trunk/core stabilization exercises and standing balance exs., coordination exercises esp for RLE.  POOL = swimming laps.  gait without UE support, balance with narrow BOS, tall kneeling, coordination -  skiing/jacks, SLS, step ups  PT Home Exercise Plan [id number]  Consulted and Agree with Plan of Care Patient;Family member/caregiver  Family Member Consulted wife, Linwood Dibbles, [place], DPT 01/02/22    12:02 PM

## 2022-01-01 NOTE — Telephone Encounter (Signed)
Called to schedule consult, no answer, left vm. AW  

## 2022-01-01 NOTE — Therapy (Signed)
[facility] °[facility] °[address] °[place], [place], [address] °Phone: [phone]   Fax:  [phone] ° °Occupational Therapy Treatment ° °Patient Details  °Name: Maurice Little °MRN: [id number] °Date of Birth: 03/22/1956 °Referring Provider (OT): Daniel Angiulli, PA-C ° ° °Encounter Date: 01/01/2022 ° ° OT End of Session - 01/01/22 1022   ° ° Visit Number 25   ° Number of Visits 32   [id number]  ° Date for OT Re-Evaluation 01/26/22   ° Authorization Type UHC--no visit limit/no auth, copay per day   ° Authorization Time Period renewal completed 12/27/21   ° OT Start Time 1020   ° OT Stop Time 1100   ° OT Time Calculation (min) 40 min   ° Activity Tolerance Patient tolerated treatment well   ° Behavior During Therapy WFL for tasks assessed/performed   ° °  °  ° °  ° ° °Past Medical History:  °Diagnosis Date  ° Benign prostatic hyperplasia with elevated prostate specific antigen (PSA)   ° Hypertension   ° Iliac artery stenosis, left (HCC)   ° with claudication resolved with stent  ° PAD (peripheral artery disease) (HCC)   ° Stroke (HCC)   ° ° °Past Surgical History:  °Procedure Laterality Date  ° BUBBLE STUDY  08/31/2021  ° Procedure: BUBBLE STUDY;  Surgeon: [place], Tiffany, MD;  Location: [facility];  Service: Cardiovascular;;  ° ILIAC ARTERY STENT Left   ° IR ANGIO VERTEBRAL SEL SUBCLAVIAN INNOMINATE UNI L MOD SED  08/30/2021  ° IR ANGIO VERTEBRAL SEL SUBCLAVIAN INNOMINATE UNI R MOD SED  08/30/2021  ° IR CT HEAD LTD  08/30/2021  ° IR INTRA CRAN STENT  08/30/2021  ° IR RADIOLOGIST EVAL & MGMT  11/28/2021  ° IR US GUIDE VASC ACCESS RIGHT  08/30/2021  ° RADIOLOGY WITH ANESTHESIA N/A 08/30/2021  ° Procedure: IR WITH ANESTHESIA;  Surgeon: Deveshwar, Sanjeev, MD;  Location: [facility];  Service: Radiology;  Laterality: N/A;  ° TEE WITHOUT CARDIOVERSION N/A 08/31/2021  ° Procedure: TRANSESOPHAGEAL ECHOCARDIOGRAM (TEE);  Surgeon: Carlton, Tiffany, MD;  Location: [facility];  Service:  Cardiovascular;  Laterality: N/A;  ° ° °There were no vitals filed for this visit. ° ° Subjective Assessment - 01/01/22 1021   ° ° Subjective  I feel the shoulder every now and then but it doesn't bother me like it used to   ° Pertinent History CVA (R cereballar and R small R brachium potis and R pontomedullary junction).   PMH:  dyslipidemia, HTN, carotid stenosis, anemia, PAD   ° Limitations ataxia, diplopia, folley catheter--d/c, decr hearing (particularly R side)   ° Patient Stated Goals incr independence, get better   ° Currently in Pain? No/denies   ° Pain Onset More than a month ago   ° °  °  ° °  ° ° ° °Sitting, closed-chain shoulder flex and chest press with BUEs with ball with min cueing for shoulder position. ° °In prone, scapular retraction with shoulders in extension and then wt. Bearing on elbows with head/chest lift for incr scapular stability and depression with min cueing. ° °Quadruped, forward/backward wt. Shifts for incr scapular depression and shoulder/core stability. ° °Standing, rolling ball up wall with BUEs with min cueing/facilitation for shoulder compensation RUE. ° °Standing, while completing PVC peg design with good accuracy for incr activity tolerance and bilateral hand coordination. ° °Arm bike x6 min level 1 for reciprocal movement (forward/backwards) without rest, min cueing initially for R shouler position.   ° °  Ambulating with RW with supervision during session     OT Short Term Goals - 12/04/21 0854       OT SHORT TERM GOAL #1   Title Pt will be independent with initial HEP.--check STGs 11/02/21    Time 4    Period Weeks    Status Achieved      OT SHORT TERM GOAL #2   Title Pt will perform BADLs mod I except supervision for shower transfer.    Time 4    Period Weeks    Status Achieved   min A for back, supervision for standing to pull up pants.  11/20/21:  supervision for ambulation for toileting (mod I for tolieting at night with [facility]), supervision for shower  transfer.  12/04/21:  now ambulating to restroom mod I     OT SHORT TERM GOAL #3   Title Pt will perform tabletop visual scanning with 90% accuracy and no reports of diplopia.    Time 4    Period Weeks    Status Achieved      OT SHORT TERM GOAL #4   Title Pt will verbalize understanding of visual compensation strategies for ADLs/IADLs prn (including ?taping, head turns/positioning)    Time 4    Period Weeks    Status Achieved   Pt/ wife verbalize understanding     OT SHORT TERM GOAL #5   Title Pt will improve coordination for ADLs as shown by improving time on 9-hole peg test by at least 5sec bilaterally.    Baseline R-66.85sec, L-41.25sec    Time 4    Period Weeks    Status Achieved   R 65.25-not met yet improvedd, L 33.60- met partially met, met for LUE.  11/20/21:  R-51.75sec              OT Long Term Goals - 12/27/21 1030       OT LONG TERM GOAL #1   Title Pt will be independent with updated HEP.    Time 12    Period Weeks    Status On-going   12/25/21:  not fully met/not performing consistently     OT LONG TERM GOAL #2   Title Pt will perform simple snack prep/home maintenance task with supervision.--11/29/21 met initial goal, updated 11/29/21:  Pt will peform simple cooking and home maintenance tasks mod I.    Time 12    Period Weeks    Status On-going   12/25/21:  ongoing, met initial goal, but has not met updated goal     OT LONG TERM GOAL #3   Title Pt will perform environmental scanning/navigation with at least 90% accuracy for incr safety in the home/community.    Time 12    Period Weeks    Status On-going   12/18/21:  CGA with 93% accuracy in mod distracting environment     OT LONG TERM GOAL #4   Title Pt will improve RUE coordination as shown by improving time on 9-hole peg test by at least 15sec.--met.  11/27/21 upgraded:  Pt will complete 9-hole peg test in 45sec or less with RUE.    Baseline 66.85sec    Time 12    Period Weeks    Status Revised   12/25/21:   55.57sec     OT LONG TERM GOAL #5   Title Pt will be able to retrieve/replace 1-2 objects on overhead shelf with each RUE x10 safetly and with good control for IADL tasks without pain.  Time 12    Period Weeks    Status Revised   12/25/21:  can place lightweight objects <1lb, but needs cueing/facilitation for positioning and reports 1-2/10 R shoulder pain, able to perform with LUE (revised 12/25/21)     OT LONG TERM GOAL #6   Title Pt will be able to type with at least 80% accuracy with at least 20wpm in prep for work tasks.    Baseline 39% with 10wpm    Time 12    Period Weeks    Status Deferred   pt now not planning to go back to work     OT [place] #7   Title Pt will be able to write at least 4 sentences with 100% legibility in reasonable amount of time.    Time 12    Period Weeks    Status On-going   11/29/21:  approx 90% legibility.  12/25/21:  approx 75% legibility                  Plan - 01/01/22 1023     Clinical Impression Statement Pt reports decr R shoulder pain and improved functional mobility at home.    OT Occupational Profile and History Detailed Assessment- Review of Records and additional review of physical, cognitive, psychosocial history related to current functional performance    Occupational performance deficits (Please refer to evaluation for details): ADL's;IADL's;Work;Leisure    Body Structure / Function / Physical Skills ADL;UE functional use;Balance;FMC;Coordination;Sensation;IADL;Decreased knowledge of use of DME;Dexterity;Vision    Rehab Potential Good    Clinical Decision Making Several treatment options, min-mod task modification necessary    Comorbidities Affecting Occupational Performance: May have comorbidities impacting occupational performance    Modification or Assistance to Complete Evaluation  Min-Moderate modification of tasks or assist with assess necessary to complete eval    OT Frequency 2x / week    OT Duration 12 weeks    +eval   OT Treatment/Interventions Self-care/ADL training;Energy conservation;Ultrasound;Visual/perceptual remediation/compensation;Patient/family education;DME and/or AE instruction;Aquatic Therapy;Paraffin;Passive range of motion;Balance training;Cryotherapy;Functional Mobility Training;Moist Heat;Therapeutic exercise;Manual Therapy;Therapeutic activities;Neuromuscular education    Plan continue to address IADL performance/balance, coordination, and R shoulder pain/stability prn    OT Home Exercise Plan Pt reports that he has some coordination activities and visual HEP from hospital    Consulted and Agree with Plan of Care Patient;Family member/caregiver    Family Member Consulted wife             Patient will benefit from skilled therapeutic intervention in order to improve the following deficits and impairments:   Body Structure / Function / Physical Skills: ADL, UE functional use, Balance, FMC, Coordination, Sensation, IADL, Decreased knowledge of use of DME, Dexterity, Vision       Visit Diagnosis: Other lack of coordination  Unsteadiness on feet  Visuospatial deficit  Other disturbances of skin sensation  Ataxia  Other abnormalities of gait and mobility  Other symptoms and signs involving the nervous system    Problem List Patient Active Problem List   Diagnosis Date Noted   Familial hypercholesteremia 11/06/2021   PFO (patent foramen ovale) 11/06/2021   Urinary retention 09/27/2021   Diplopia 09/27/2021   Carotid stenosis, right 09/27/2021   Anemia 09/27/2021   Slow transit constipation    Benign essential HTN    Cerebellar stroke ([place]) 09/04/2021   Acute ischemic stroke (HCC)    Cerebral embolism with cerebral infarction 08/30/2021   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 °[place], [place]  [address] °[phone] phone °[phone] °01/01/22 11:00 AM ° ° ° °

## 2022-01-08 ENCOUNTER — Ambulatory Visit: Payer: 59 | Admitting: Physical Therapy

## 2022-01-08 ENCOUNTER — Other Ambulatory Visit: Payer: Self-pay

## 2022-01-08 ENCOUNTER — Ambulatory Visit: Payer: 59 | Admitting: Occupational Therapy

## 2022-01-08 ENCOUNTER — Encounter: Payer: Self-pay | Admitting: Occupational Therapy

## 2022-01-08 DIAGNOSIS — R278 Other lack of coordination: Secondary | ICD-10-CM

## 2022-01-08 DIAGNOSIS — R270 Ataxia, unspecified: Secondary | ICD-10-CM

## 2022-01-08 DIAGNOSIS — R208 Other disturbances of skin sensation: Secondary | ICD-10-CM

## 2022-01-08 DIAGNOSIS — R29818 Other symptoms and signs involving the nervous system: Secondary | ICD-10-CM

## 2022-01-08 DIAGNOSIS — R260 Ataxic gait: Secondary | ICD-10-CM

## 2022-01-08 DIAGNOSIS — R2681 Unsteadiness on feet: Secondary | ICD-10-CM

## 2022-01-08 DIAGNOSIS — M25511 Pain in right shoulder: Secondary | ICD-10-CM

## 2022-01-08 DIAGNOSIS — R41842 Visuospatial deficit: Secondary | ICD-10-CM

## 2022-01-08 DIAGNOSIS — R2689 Other abnormalities of gait and mobility: Secondary | ICD-10-CM

## 2022-01-08 NOTE — Therapy (Signed)
[place] ?[place] ?[place], [place], [address] ?Phone: [phone]   Fax:  [phone] ? ?Occupational Therapy Treatment ? ?Patient Details  ?Name: Maurice Little ?MRN: [id number] ?Date of Birth: Feb 22, 1956 ?Referring Provider (OT): Lauraine Rinne, PA-C ? ? ?Encounter Date: 01/08/2022 ? ? OT End of Session - 01/08/22 0936   ? ? Visit Number 26   ? Number of Visits 32   [phone]  ? Date for OT Re-Evaluation 01/26/22   ? Authorization Type UHC--no visit limit/no auth, copay per day   ? Authorization Time Period renewal completed 12/27/21   ? OT Start Time [phone]   ? OT Stop Time 1015   ? OT Time Calculation (min) 41 min   ? Activity Tolerance Patient tolerated treatment well   ? Behavior During Therapy [facility] for tasks assessed/performed   ? ?  ?  ? ?  ? ? ?Past Medical History:  ?Diagnosis Date  ? Benign prostatic hyperplasia with elevated prostate specific antigen (PSA)   ? Hypertension   ? Iliac artery stenosis, left (HCC)   ? with claudication resolved with stent  ? PAD (peripheral artery disease) ([place])   ? Stroke [facility])   ? ? ?Past Surgical History:  ?Procedure Laterality Date  ? BUBBLE STUDY  08/31/2021  ? Procedure: BUBBLE STUDY;  Surgeon: Skeet Latch, MD;  Location: [place];  Service: Cardiovascular;;  ? ILIAC ARTERY STENT Left   ? IR ANGIO VERTEBRAL SEL SUBCLAVIAN INNOMINATE UNI L MOD SED  08/30/2021  ? IR ANGIO VERTEBRAL SEL SUBCLAVIAN INNOMINATE UNI R MOD SED  08/30/2021  ? IR CT HEAD LTD  08/30/2021  ? IR INTRA CRAN STENT  08/30/2021  ? IR RADIOLOGIST EVAL & MGMT  11/28/2021  ? IR US GUIDE VASC ACCESS RIGHT  08/30/2021  ? RADIOLOGY WITH ANESTHESIA N/A 08/30/2021  ? Procedure: IR WITH ANESTHESIA;  Surgeon: Luanne Bras, MD;  Location: [place];  Service: Radiology;  Laterality: N/A;  ? TEE WITHOUT CARDIOVERSION N/A 08/31/2021  ? Procedure: TRANSESOPHAGEAL ECHOCARDIOGRAM (TEE);  Surgeon: Skeet Latch, MD;  Location: [place];   Service: Cardiovascular;  Laterality: N/A;  ? ? ?There were no vitals filed for this visit. ? ? Subjective Assessment - 01/08/22 0936   ? ? Subjective  R shoulder is ok.   ? Pertinent History CVA (R cereballar and R small R brachium potis and R pontomedullary junction).   PMH:  dyslipidemia, HTN, carotid stenosis, anemia, PAD   ? Limitations ataxia, diplopia, folley catheter--d/c, decr hearing (particularly R side)   ? Patient Stated Goals incr independence, get better   ? Currently in Pain? No/denies   ? Pain Onset More than a month ago   ? ?  ?  ? ?  ? ? ? ? ?Placing pieces in [place] Pegboard to complete with min difficulty/incr time. ? ?Environmental scanning with ambulation with RW in kitchen with good accuracy, min v.c. for walker placement in tight spaces for incr safety.  Also discussed moving hot pt with walker tray--Recommended pt remove eggs from pot with slotted spoon and transfer into cool bowl to take to the sink to add cool water for incr safety.  Pt verbalized understanding of recommendations ? ?Writing grocery list with 75% legibility.  Pt demo incr difficulty with lowercase "e, a, o" Tracing circles with min-mod decr in accuracy/difficulty.  ? ?Ambulation with supervision with RW. ? ? ? ? ? OT Short Term Goals - 12/04/21 0854   ? ?  ?  OT SHORT TERM GOAL #1  ? Title Pt will be independent with initial HEP.--check STGs 11/02/21   ? Time 4   ? Period Weeks   ? Status Achieved   ?  ? OT SHORT TERM GOAL #2  ? Title Pt will perform BADLs mod I except supervision for shower transfer.   ? Time 4   ? Period Weeks   ? Status Achieved   min A for back, supervision for standing to pull up pants.  11/20/21:  supervision for ambulation for toileting (mod I for tolieting at night with [facility]), supervision for shower transfer.  12/04/21:  now ambulating to restroom mod I  ?  ? OT SHORT TERM GOAL #3  ? Title Pt will perform tabletop visual scanning with 90% accuracy and no reports of diplopia.   ? Time 4   ? Period Weeks    ? Status Achieved   ?  ? OT SHORT TERM GOAL #4  ? Title Pt will verbalize understanding of visual compensation strategies for ADLs/IADLs prn (including ?taping, head turns/positioning)   ? Time 4   ? Period Weeks   ? Status Achieved   Pt/ wife verbalize understanding  ?  ? OT SHORT TERM GOAL #5  ? Title Pt will improve coordination for ADLs as shown by improving time on 9-hole peg test by at least 5sec bilaterally.   ? Baseline R-66.85sec, L-41.25sec   ? Time 4   ? Period Weeks   ? Status Achieved   R 65.25-not met yet improvedd, L 33.60- met partially met, met for LUE.  11/20/21:  R-51.75sec  ? ?  ?  ? ?  ? ? ? ? OT Long Term Goals - 01/08/22 0942   ? ?  ? OT LONG TERM GOAL #1  ? Title Pt will be independent with updated HEP.   ? Time 12   ? Period Weeks   ? Status On-going   12/25/21:  not fully met/not performing consistently  ?  ? OT LONG TERM GOAL #2  ? Title Pt will perform simple snack prep/home maintenance task with supervision.--11/29/21 met initial goal, updated 11/29/21:  Pt will peform simple cooking and home maintenance tasks mod I.   ? Time 12   ? Period Weeks   ? Status On-going   12/25/21:  ongoing, met initial goal, but has not met updated goal.  01/08/22:  snack prep mod I  ?  ? OT LONG TERM GOAL #3  ? Title Pt will perform environmental scanning/navigation with at least 90% accuracy for incr safety in the home/community.   ? Time 12   ? Period Weeks   ? Status On-going   12/18/21:  CGA with 93% accuracy in mod distracting environment  ?  ? OT LONG TERM GOAL #4  ? Title Pt will improve RUE coordination as shown by improving time on 9-hole peg test by at least 15sec.--met.  11/27/21 upgraded:  Pt will complete 9-hole peg test in 45sec or less with RUE.   ? Baseline 66.85sec   ? Time 12   ? Period Weeks   ? Status Revised   12/25/21:  55.57sec  ?  ? OT LONG TERM GOAL #5  ? Title Pt will be able to retrieve/replace 1-2 objects on overhead shelf with each RUE x10 safetly and with good control for IADL tasks  without pain.   ? Time 12   ? Period Weeks   ? Status Revised   12/25/21:  can  place lightweight objects <1lb, but needs cueing/facilitation for positioning and reports 1-2/10 R shoulder pain, able to perform with LUE (revised 12/25/21)  ?  ? OT LONG TERM GOAL #6  ? Title Pt will be able to type with at least 80% accuracy with at least 20wpm in prep for work tasks.   ? Baseline 39% with 10wpm   ? Time 12   ? Period Weeks   ? Status Deferred   pt now not planning to go back to work  ?  ? OT [place] #7  ? Title Pt will be able to write at least 4 sentences with 100% legibility in reasonable amount of time.   ? Time 12   ? Period Weeks   ? Status On-going   11/29/21:  approx 90% legibility.  12/25/21:  approx 75% legibility  ? ?  ?  ? ?  ? ? ? ? ? ? ? ? Plan - 01/08/22 0937   ? ? Clinical Impression Statement Pt reports decr R shoulder pain and improved functional mobility at home.  Pt is performing snack prep mod I.   ? OT Occupational Profile and History Detailed Assessment- Review of Records and additional review of physical, cognitive, psychosocial history related to current functional performance   ? Occupational performance deficits (Please refer to evaluation for details): ADL's;IADL's;Work;Leisure   ? Body Structure / Function / Physical Skills ADL;UE functional use;Balance;FMC;Coordination;Sensation;IADL;Decreased knowledge of use of DME;Dexterity;Vision   ? Rehab Potential Good   ? Clinical Decision Making Several treatment options, min-mod task modification necessary   ? Comorbidities Affecting Occupational Performance: May have comorbidities impacting occupational performance   ? Modification or Assistance to Complete Evaluation  Min-Moderate modification of tasks or assist with assess necessary to complete eval   ? OT Frequency 2x / week   ? OT Duration 12 weeks   +eval  ? OT Treatment/Interventions Self-care/ADL training;Energy conservation;Ultrasound;Visual/perceptual  remediation/compensation;Patient/family education;DME and/or AE instruction;Aquatic Therapy;Paraffin;Passive range of motion;Balance training;Cryotherapy;Functional Mobility Training;Moist Heat;Therapeutic exercise;Manual Therapy;Therapeutic acti

## 2022-01-08 NOTE — Therapy (Signed)
? ?OUTPATIENT PHYSICAL THERAPY TREATMENT NOTE ? ? ?Patient Name: Maurice Little ?MRN: [id number] ?DOB:04-22-1956, 66 y.o., male ?Today's Date: 01/08/2022 ? ?PCP: de [place], Raymond J, MD ?REFERRING PROVIDER: de [place], Raymond J, MD ? ? PT End of Session - 01/08/22 1330   ? ? Visit Number 22   ? Number of Visits 25   ? Date for PT Re-Evaluation 01/11/22   ? Authorization Type UHC ([place] with no auth required)   ? PT Start Time 1105   ? PT Stop Time 1148   ? PT Time Calculation (min) 43 min   ? Equipment Utilized During Treatment Other (comment)   aqua dumbbells  ? Activity Tolerance Patient tolerated treatment well   ? Behavior During Therapy [facility] for tasks assessed/performed   ? ?  ?  ? ?  ? ? ? ?Past Medical History:  ?Diagnosis Date  ? Benign prostatic hyperplasia with elevated prostate specific antigen (PSA)   ? Hypertension   ? Iliac artery stenosis, left (HCC)   ? with claudication resolved with stent  ? PAD (peripheral artery disease) (HCC)   ? Stroke [facility])   ? ?Past Surgical History:  ?Procedure Laterality Date  ? BUBBLE STUDY  08/31/2021  ? Procedure: BUBBLE STUDY;  Surgeon: Chilton Si, MD;  Location: [facility] ENDOSCOPY;  Service: Cardiovascular;;  ? ILIAC ARTERY STENT Left   ? IR ANGIO VERTEBRAL SEL SUBCLAVIAN INNOMINATE UNI L MOD SED  08/30/2021  ? IR ANGIO VERTEBRAL SEL SUBCLAVIAN INNOMINATE UNI R MOD SED  08/30/2021  ? IR CT HEAD LTD  08/30/2021  ? IR INTRA CRAN STENT  08/30/2021  ? IR RADIOLOGIST EVAL & MGMT  11/28/2021  ? IR US GUIDE VASC ACCESS RIGHT  08/30/2021  ? RADIOLOGY WITH ANESTHESIA N/A 08/30/2021  ? Procedure: IR WITH ANESTHESIA;  Surgeon: Julieanne Cotton, MD;  Location: [facility];  Service: Radiology;  Laterality: N/A;  ? TEE WITHOUT CARDIOVERSION N/A 08/31/2021  ? Procedure: TRANSESOPHAGEAL ECHOCARDIOGRAM (TEE);  Surgeon: Chilton Si, MD;  Location: [facility] ENDOSCOPY;  Service: Cardiovascular;  Laterality: N/A;  ? ?Patient Active Problem List  ? Diagnosis Date Noted  ? Familial hypercholesteremia  11/06/2021  ? PFO (patent foramen ovale) 11/06/2021  ? Urinary retention 09/27/2021  ? Diplopia 09/27/2021  ? Carotid stenosis, right 09/27/2021  ? Anemia 09/27/2021  ? Slow transit constipation   ? Benign essential HTN   ? 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  ? Acute ischemic stroke (HCC)   ? Cerebral embolism with cerebral infarction 08/30/2021  ? 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 ? ?REFERRING DIAG: I63.9 (ICD-10-CM) - Cerebral infarction, unspecified ? ?THERAPY DIAG:  ?Ataxic gait ? ?Other abnormalities of gait and mobility ? ?Other lack of coordination ? ?Unsteadiness on feet ? ?Other symptoms and signs involving the nervous system ? ?PERTINENT HISTORY:  basilar artery stenosis, dyslipidemia,  HTN, neurogenic bladder, diplopia, anemia, Lt iliac artery stenosis, PAD  ? ?PRECAUTIONS: Fall ? ?SUBJECTIVE: Had VNG testing done; they said there "exercises I could do to decrease my dizziness".  Will bring report in to next visit for therapists to read.  Goes to audiology to be assessed for hearing aides that would send sound to R ear.   ? ?PAIN:  ?Are you having pain? No ? ? ?TODAY'S TREATMENT:  ? ?01/08/2022:   ?Patient seen for aquatic therapy today.  Treatment took place in water 3-4 feet deep depending upon activity.  Pt entered and exited  the pool via stairs with rails. ? ?Initiated with gait training the full length of the pool from shallow <> deep without UE support with therapist providing min-mod A for balance when in shallow water.  Pt continues to require assistance for anterior weight shift, weight shift and increased stance time on RLE and cues to increase step and stride length.  Performed x 3 laps down and back.   ? ?Returned to use of aqua dumbbells in each UE: With UE support performed gait with higher level challenges across length of pool from shallow <> deep: 2 laps tandem gait and 2 laps grapevine cross overs  with R then LLE.  During tandem gait stopped in 4 ft water and performed static tandem stance with R then L foot forwards and then performed lateral reaching to L and R <> midline with UE in ABD to promote trunk elongation, head righting and use of hips for balance reaction training.   ? ?Transitioned to sitting on bench.  Performed Sit<>stand in single leg stance, alternating between R and LLE.  Required mod-max A from PT for balance and cues for full weight shift over each LE.  Increased assistance required to shift weight over RLE and to maintain balance over stance LE once standing; also provided cues to relax ankle of lifted LE to decrease cramping in floating LE. ? ?Continued aerobic conditioning with 2 laps down length of pool without stopping for rest break: Swim prone<>swim supine with tuck and push off wall.  Cues to increase use of LE kicks when swimming. ? ?Pt requires buoyancy of water for support for joint offloading for body weight support and proprioception to reduce fall risk with gait training and balance exercises with minimal UE support; exercises able to be performed safely in water without the risk of fall compared to those same exercises performed on land;  viscosity of water needed for resistance for strengthening.  Current of water provides perturbations for challenging static & dynamic standing balance. ? ? ?01/01/2022:  ?Patient seen for aquatic therapy today.  Treatment took place in water 3-4 feet deep depending upon activity.  Pt entered and exited the pool via stairs with bilat rails, alternating sequence. ?   ?Performed gait across width of pool in 36ft holding aqua dumbbells in each UE focusing on more narrow BOS, full step and stride length bilaterally.  Continued to use aqua dumbbells for UE support while performing dynamic balance activities of toe walking across width of pool x 3 laps and then tandem gait x 3 laps.  Performed stationary alternating cross [place] ski x 10 reps x 3  sets and low jumping jacks keeping UE under water x 10 reps x 3 sets for aerobic conditioning and coordination. ?   ?Balance with feet together - small weight shifts anterior/posterior for ankle strategy training and midline orientation; focused on maintaining balance with narrow BOS with EO and EC while performing 10 reps head turns/head nods.  Mod A required for safety due to lateral LOB. ? ?Continued aerobic conditioning and coordination with swimming length of pool prone and then supine with body tucks to stand between each set.  Pt experienced cramping in R hamstring muscle - performed standing straight leg hamstring stretch at the wall with no further cramping noted.  Continued swimming but increased challenge by having pt use body tuck to switch between prone <> supine without stopping in standing for a rest break. ?Also performed transition from prone <> supine through whole body rotation to  L and R, multiple repetitions. ? ?Completed session by returning to ambulating the full length of the pool from shallow <> deep without UE support with pt demonstrating increased difficulty maintaining reciprocal UE swing and increased step/stride length as he entered more shallow depths.  Intermittent mod A require to maintain balance in midline during gait. ? ?Pt requires buoyancy of water for support for body weight support during gait training, to reduce fall risk with gait training and balance exercises with minimal UE support; exercises able to be performed safely in water without the risk of fall compared to those same exercises performed on land;  viscosity of water needed for resistance for strengthening.  Current of water provides perturbations for challenging static & dynamic standing balance. ? ? ?12/27/21: ?NMR:  Pt transferred sitting to tall kneeling position on floor (on red mat); transferred to quadruped with CGA; pt performed hip extension 1 rep each leg with CGA for balance; then performed contralateral  UE flexion/ LE extension (bird dog pose) for 3 sec hold 3 reps each side; ?Pt perfomed hip abdct./ adduction 3 reps each leg in tall kneeling position with LUE support on mat; then performed hip flexion/extension

## 2022-01-10 ENCOUNTER — Other Ambulatory Visit: Payer: Self-pay

## 2022-01-10 ENCOUNTER — Ambulatory Visit: Payer: 59 | Admitting: Occupational Therapy

## 2022-01-10 ENCOUNTER — Ambulatory Visit (HOSPITAL_COMMUNITY)
Admission: RE | Admit: 2022-01-10 | Discharge: 2022-01-10 | Disposition: A | Discharge status: Home / Self Care | Payer: 59 | Source: Ambulatory Visit | Attending: Interventional Radiology | Admitting: Interventional Radiology

## 2022-01-10 ENCOUNTER — Encounter: Payer: Self-pay | Admitting: Occupational Therapy

## 2022-01-10 ENCOUNTER — Ambulatory Visit: Payer: 59 | Admitting: Physical Therapy

## 2022-01-10 DIAGNOSIS — R2689 Other abnormalities of gait and mobility: Secondary | ICD-10-CM

## 2022-01-10 DIAGNOSIS — R278 Other lack of coordination: Secondary | ICD-10-CM

## 2022-01-10 DIAGNOSIS — R2681 Unsteadiness on feet: Secondary | ICD-10-CM

## 2022-01-10 DIAGNOSIS — R29818 Other symptoms and signs involving the nervous system: Secondary | ICD-10-CM

## 2022-01-10 NOTE — Therapy (Signed)
[facility] [facility], [place], [address] ?Phone: [phone]   Fax:  [phone] ? ?Occupational Therapy Treatment ? ?Patient Details  ?Name: Maurice Little ?MRN: [id number] ?Date of Birth: December 22, 1955 ?Referring Provider (OT): Lauraine Rinne, PA-C ? ? ?Encounter Date: 01/10/2022 ? ? OT End of Session - 01/10/22 1129   ? ? Visit Number 27   ? Number of Visits 32   [phone]  ? Date for OT Re-Evaluation 01/26/22   ? Authorization Type UHC--no visit limit/no auth, copay per day   ? Authorization Time Period renewal completed 12/27/21   ? OT Start Time 1102   ? OT Stop Time 1145   ? OT Time Calculation (min) 43 min   ? Activity Tolerance Patient tolerated treatment well   ? Behavior During Therapy [facility] for tasks assessed/performed   ? ?  ?  ? ?  ? ? ?Past Medical History:  ?Diagnosis Date  ? Benign prostatic hyperplasia with elevated prostate specific antigen (PSA)   ? Hypertension   ? Iliac artery stenosis, left (HCC)   ? with claudication resolved with stent  ? PAD (peripheral artery disease) ([place])   ? Stroke [facility])   ? ? ?Past Surgical History:  ?Procedure Laterality Date  ? BUBBLE STUDY  08/31/2021  ? Procedure: BUBBLE STUDY;  Surgeon: Skeet Latch, MD;  Location: [place];  Service: Cardiovascular;;  ? ILIAC ARTERY STENT Left   ? IR ANGIO VERTEBRAL SEL SUBCLAVIAN INNOMINATE UNI L MOD SED  08/30/2021  ? IR ANGIO VERTEBRAL SEL SUBCLAVIAN INNOMINATE UNI R MOD SED  08/30/2021  ? IR CT HEAD LTD  08/30/2021  ? IR INTRA CRAN STENT  08/30/2021  ? IR RADIOLOGIST EVAL & MGMT  11/28/2021  ? IR US GUIDE VASC ACCESS RIGHT  08/30/2021  ? RADIOLOGY WITH ANESTHESIA N/A 08/30/2021  ? Procedure: IR WITH ANESTHESIA;  Surgeon: Luanne Bras, MD;  Location: [place];  Service: Radiology;  Laterality: N/A;  ? TEE WITHOUT CARDIOVERSION N/A 08/31/2021  ? Procedure: TRANSESOPHAGEAL ECHOCARDIOGRAM (TEE);  Surgeon: Skeet Latch, MD;  Location: [place];   Service: Cardiovascular;  Laterality: N/A;  ? ? ?There were no vitals filed for this visit. ? ? Subjective Assessment - 01/10/22 1128   ? ? Subjective  Pt denies pain   ? Pertinent History CVA (R cereballar and R small R brachium potis and R pontomedullary junction).   PMH:  dyslipidemia, HTN, carotid stenosis, anemia, PAD   ? Limitations ataxia, diplopia, folley catheter--d/c, decr hearing (particularly R side)   ? Patient Stated Goals incr independence, get better   ? Currently in Pain? No/denies   ? Pain Onset More than a month ago   ? ?  ?  ? ?  ? ? ? ? ?Simple cooking task (preparing grilled cheese):  Pt able to gather all ingredients using his RW tray, assemble sandwich, and remembered to turn off burner.  Pt did use L hand at times to assist with adjusting sandwich on spatula--recommended pt to avoid this due to decr LUE sensation (pt educated in alternatives).  Pt also with decr balance with step x1 but able to recover balance unassisted (as he was using UE support appropriately on counter). ? ?Placing O'connor pegs in pegboard with tweezers with min difficulty/incr time for incr coordination.  ? ?Reviewed yellow theraband HEP.  Pt returned demo each with min v.c. x10.   ? ? ? ? ? ? ? ? ? OT Education - 01/10/22 1130   ? ?  Education Details Instructed pt to avoid reaching with LUE in pan to help flip sandwich due to decr sensation, recommended larger pan/smaller spatula for incr ease or use fork to assist   ? Person(s) Educated Patient   ? Methods Explanation   ? Comprehension Verbalized understanding   ? ?  ?  ? ?  ? ? ? OT Short Term Goals - 01/10/22 1137   ? ?  ? OT SHORT TERM GOAL #1  ? Title Pt will be independent with initial HEP.--check STGs 11/02/21   ? Time 4   ? Period Weeks   ? Status Achieved   ?  ? OT SHORT TERM GOAL #2  ? Title Pt will perform BADLs mod I except supervision for shower transfer.   ? Time 4   ? Period Weeks   ? Status Achieved   min A for back, supervision for standing to pull up  pants.  11/20/21:  supervision for ambulation for toileting (mod I for tolieting at night with [facility]), supervision for shower transfer.  12/04/21:  now ambulating to restroom mod I  ?  ? OT SHORT TERM GOAL #3  ? Title Pt will perform tabletop visual scanning with 90% accuracy and no reports of diplopia.   ? Time 4   ? Period Weeks   ? Status Achieved   ?  ? OT SHORT TERM GOAL #4  ? Title Pt will verbalize understanding of visual compensation strategies for ADLs/IADLs prn (including ?taping, head turns/positioning)   ? Time 4   ? Period Weeks   ? Status Achieved   Pt/ wife verbalize understanding  ?  ? OT SHORT TERM GOAL #5  ? Title Pt will improve coordination for ADLs as shown by improving time on 9-hole peg test by at least 5sec bilaterally.   ? Baseline R-66.85sec, L-41.25sec   ? Time 4   ? Period Weeks   ? Status Achieved   R 65.25-not met yet improvedd, L 33.60- met partially met, met for LUE.  11/20/21:  R-51.75sec  ? ?  ?  ? ?  ? ? ? ? OT Long Term Goals - 01/10/22 1137   ? ?  ? OT LONG TERM GOAL #1  ? Title Pt will be independent with updated HEP.   ? Time 12   ? Period Weeks   ? Status On-going   12/25/21:  not fully met/not performing consistently  ?  ? OT LONG TERM GOAL #2  ? Title Pt will perform simple snack prep/home maintenance task with supervision.--11/29/21 met initial goal, updated 11/29/21:  Pt will peform simple cooking and home maintenance tasks mod I.   ? Time 12   ? Period Weeks   ? Status On-going   12/25/21:  ongoing, met initial goal, but has not met updated goal.  01/08/22:  snack prep mod I.  01/10/22:  simple cooking task supervision-mod I  ?  ? OT LONG TERM GOAL #3  ? Title Pt will perform environmental scanning/navigation with at least 90% accuracy for incr safety in the home/community.   ? Time 12   ? Period Weeks   ? Status On-going   12/18/21:  CGA with 93% accuracy in mod distracting environment  ?  ? OT LONG TERM GOAL #4  ? Title Pt will improve RUE coordination as shown by improving time on  9-hole peg test by at least 15sec.--met.  11/27/21 upgraded:  Pt will complete 9-hole peg test in 45sec or less with RUE.   ?  Baseline 66.85sec   ? Time 12   ? Period Weeks   ? Status Revised   12/25/21:  55.57sec  ?  ? OT LONG TERM GOAL #5  ? Title Pt will be able to retrieve/replace 1-2 objects on overhead shelf with each RUE x10 safetly and with good control for IADL tasks without pain.   ? Time 12   ? Period Weeks   ? Status Revised   12/25/21:  can place lightweight objects <1lb, but needs cueing/facilitation for positioning and reports 1-2/10 R shoulder pain, able to perform with LUE (revised 12/25/21)  ?  ? OT LONG TERM GOAL #6  ? Title Pt will be able to type with at least 80% accuracy with at least 20wpm in prep for work tasks.   ? Baseline 39% with 10wpm   ? Time 12   ? Period Weeks   ? Status Deferred   pt now not planning to go back to work  ?  ? OT [place] #7  ? Title Pt will be able to write at least 4 sentences with 100% legibility in reasonable amount of time.   ? Time 12   ? Period Weeks   ? Status On-going   11/29/21:  approx 90% legibility.  12/25/21:  approx 75% legibility  ? ?  ?  ? ?  ? ? ? ? ? ? ? ? Plan - 01/10/22 1129   ? ? Clinical Impression Statement Pt continues to progress with functional performance.   ? OT Occupational Profile and History Detailed Assessment- Review of Records and additional review of physical, cognitive, psychosocial history related to current functional performance   ? Occupational performance deficits (Please refer to evaluation for details): ADL's;IADL's;Work;Leisure   ? Body Structure / Function / Physical Skills ADL;UE functional use;Balance;FMC;Coordination;Sensation;IADL;Decreased knowledge of use of DME;Dexterity;Vision   ? Rehab Potential Good   ? Clinical Decision Making Several treatment options, min-mod task modification necessary   ? Comorbidities Affecting Occupational Performance: May have comorbidities impacting occupational performance   ?  Modification or Assistance to Complete Evaluation  Min-Moderate modification of tasks or assist with assess necessary to complete eval   ? OT Frequency 2x / week   ? OT Duration 12 weeks   +eval  ? OT Treatment/Intervent

## 2022-01-11 ENCOUNTER — Encounter: Payer: Self-pay | Admitting: Physical Therapy

## 2022-01-11 NOTE — Therapy (Signed)
?OUTPATIENT PHYSICAL THERAPY TREATMENT NOTE ? ? ?Patient Name: Maurice Little ?MRN: [id number] ?DOB:03-21-1956, 66 y.o., male ?Today's Date: 01/11/2022 ? ?PCP: de [place], Raymond J, MD ?REFERRING PROVIDER: de [place], Raymond J, MD ? ? PT End of Session - 01/11/22 1015   ? ? Visit Number 23   ? Number of Visits 39   ? Date for PT Re-Evaluation 03/08/22   ? Authorization Type UHC ([place] with no auth required)   ? PT Start Time 1150   ? PT Stop Time 1237   ? PT Time Calculation (min) 47 min   ? Equipment Utilized During Treatment Gait belt   aqua dumbbells  ? Activity Tolerance Patient tolerated treatment well   ? Behavior During Therapy [facility] for tasks assessed/performed   ? ?  ?  ? ?  ? ? ?Past Medical History:  ?Diagnosis Date  ? Benign prostatic hyperplasia with elevated prostate specific antigen (PSA)   ? Hypertension   ? Iliac artery stenosis, left (HCC)   ? with claudication resolved with stent  ? PAD (peripheral artery disease) ([place])   ? Stroke [facility])   ? ?Past Surgical History:  ?Procedure Laterality Date  ? BUBBLE STUDY  08/31/2021  ? Procedure: BUBBLE STUDY;  Surgeon: Skeet Latch, MD;  Location: [place];  Service: Cardiovascular;;  ? ILIAC ARTERY STENT Left   ? IR ANGIO VERTEBRAL SEL SUBCLAVIAN INNOMINATE UNI L MOD SED  08/30/2021  ? IR ANGIO VERTEBRAL SEL SUBCLAVIAN INNOMINATE UNI R MOD SED  08/30/2021  ? IR CT HEAD LTD  08/30/2021  ? IR INTRA CRAN STENT  08/30/2021  ? IR RADIOLOGIST EVAL & MGMT  11/28/2021  ? IR US GUIDE VASC ACCESS RIGHT  08/30/2021  ? RADIOLOGY WITH ANESTHESIA N/A 08/30/2021  ? Procedure: IR WITH ANESTHESIA;  Surgeon: Luanne Bras, MD;  Location: [place];  Service: Radiology;  Laterality: N/A;  ? TEE WITHOUT CARDIOVERSION N/A 08/31/2021  ? Procedure: TRANSESOPHAGEAL ECHOCARDIOGRAM (TEE);  Surgeon: Skeet Latch, MD;  Location: [place];  Service: Cardiovascular;  Laterality: N/A;  ? ?Patient Active Problem List  ? Diagnosis Date Noted  ? Familial hypercholesteremia 11/06/2021  ? PFO  (patent foramen ovale) 11/06/2021  ? Urinary retention 09/27/2021  ? Diplopia 09/27/2021  ? Carotid stenosis, right 09/27/2021  ? Anemia 09/27/2021  ? Slow transit constipation   ? Benign essential HTN   ? Cerebellar stroke ([place]) 09/04/2021  ? Acute ischemic stroke ([place])   ? Cerebral embolism with cerebral infarction 08/30/2021  ? 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 ? ?REFERRING DIAG: Cerebellar CVA ? ?THERAPY DIAG:  ?Other abnormalities of gait and mobility - Plan: PT plan of care cert/re-cert ? ?Unsteadiness on feet - Plan: PT plan of care cert/re-cert ? ?Other lack of coordination - Plan: PT plan of care cert/re-cert ? ?Other symptoms and signs involving the nervous system - Plan: PT plan of care cert/re-cert ? ?PERTINENT HISTORY: Diplopia, basilar artery stenosis, PFO, cerebral infarction 08/2021, essential HTN ? ?PRECAUTIONS: Fall ? ?SUBJECTIVE: Pt reports he has been walking to the mailbox without use of RW with his wife's assistance ? ?PAIN:  ?Are you having pain? No ? ? [facility] PT Assessment - 01/11/22 0001   ? ?  ? Berg Balance Test  ? Sit to Stand Able to stand without using hands and stabilize independently   ? Standing Unsupported Able to stand 2 minutes with supervision   ? Sitting with Back  Unsupported but Feet Supported on Floor or Stool Able to sit safely and securely 2 minutes   ? Stand to Sit Sits safely with minimal use of hands   ? Transfers Able to transfer safely, definite need of hands   ? Standing Unsupported with Eyes Closed Able to stand 10 seconds with supervision   ? Standing Unsupported with Feet Together Able to place feet together independently and stand for 1 minute with supervision   ? From Standing, Reach Forward with Outstretched Arm Can reach confidently >25 cm (10")   ? From Standing Position, Pick up Object from Floor Unable to pick up shoe, but reaches 2-5 cm (1-2") from shoe and balances  independently   needs CGA for safety  ? From Standing Position, Turn to Look Behind Over each Shoulder Turn sideways only but maintains balance   ? Turn 360 Degrees Needs close supervision or verbal cueing   ? Standing Unsupported, Alternately Place Feet on Step/Stool Able to complete >2 steps/needs minimal assist   ? Standing Unsupported, One Foot in Front Able to plae foot ahead of the other independently and hold 30 seconds   ? Standing on One Leg Tries to lift leg/unable to hold 3 seconds but remains standing independently   ? Total Score 38   ? ?  ?  ? ?  ?  ?[facility] Adult PT Treatment/Exercise - 01/11/22 0001  ? ? ? Ambulation/Gait ? Ambulation/Gait Yes  ? Ambulation/Gait Assistance 4: Min guard  ? Ambulation/Gait Assistance Details No device  ? Ambulation Distance (Feet) 115 Feet  ? Assistive device None  ? Gait Pattern Step-through pattern;Ataxic;Decreased weight shift to right;Decreased weight shift to left;Wide base of support  ? Ambulation Surface Level;Indoor  ? Gait velocity 17.59 secs = 1.86 ft/sec with RW  ? ? Standardized Balance Assessment ? Standardized Balance Assessment Berg Balance Test  ? ? Timed Up and Go Test ? TUG Normal TUG  ? Normal TUG (seconds) 21.84    Pt performed at increased speed (not his normal walking speed) and needed CGA to min assist for balance, especially with the turn ? ? ?  ?PATIENT EDUCATION: ?Education details: Reviewed LTG's and progress achieved to date; verbally reviewed HEP ?Person educated: Patient ?Education method: Explanation ?Education comprehension: verbalized understanding ?  ? ? ? ? Plan - 01/11/22 1045   ? ? Clinical Impression Statement Session focused on assessment of LTG's for renewal.  Pt demonstrates progress in balance and gait but progress has slowed compared to that achieved in first 4-6 weeks in PT.  Pt has not met LTG #1 as pt continues to amb. in home with use of RW due to balance deficits (goal was for amb. without device):  LTG #2 not met as pt is  unable to use [facility] for assistance with amb. due to ataxia; pt able to amb. approx. 14' with no device with CGA to min assist; pt reports amb. to mailbox outside at home with wife's assistance.  Pt has partially met LTG #3 as TUG score is 21.84 secs with RW (goal </= 25 secs) but pt required CGA to min assist for balance.  LTG #4 not met as Berg score = 38/56 with goal 45/56 (initial score 27/56 at eval).  LTG #5 met as gait velocity = 1.86 ft/sec with RW. Pt continues to have ataxia in RLE and decreased sensation in LLE which impact mobility and progress.  Cont for 8 additional weeks with PT to address gait, balance, and coordination.   ?  Personal Factors and Comorbidities Comorbidity 2;Profession   ? Comorbidities basilar artery stenosis, dyslipidemia,  HTN, neurogenic bladder, diplopia, anemia, Lt iliac artery stenosis, PAD   ? Examination-Activity Limitations Bathing;Carry;Continence;Toileting;Dressing;Lift;Stand;Stairs;Squat;Reach Overhead;Locomotion Level;Transfers   ? Examination-Participation Restrictions Cleaning;Community Activity;Driving;Interpersonal Relationship;Laundry;Shop;Occupation;Yard Work;Meal Prep   ? Stability/Clinical Decision Making Evolving/Moderate complexity   ? Rehab Potential Good   ? PT Frequency 2x / week   ? PT Duration 8 weeks   ? PT Treatment/Interventions ADLs/Self Care Home Management;Aquatic Therapy;DME Instruction;Gait training;Stair training;Therapeutic activities;Therapeutic exercise;Balance training;Neuromuscular re-education;Patient/family education;Vestibular   ? PT Next Visit Plan LAND =  Cont with gait without RW, trunk/core stabilization exercises and standing balance exs., coordination exercises esp for RLE.  POOL = swimming laps.  gait without UE support, balance with narrow BOS, tall kneeling, coordination, SLS, step ups   ? PT Home Exercise Plan [id number]   ? Consulted and Agree with Plan of Care Patient;Family member/caregiver   ? Family Member Consulted wife, [name]    ? ?  ?  ? ?  ?  ? ? ? ? ? ?YDSWVT, VNRWC [id number], PT ?01/11/2022, 11:20 AM ? ?   ?

## 2022-01-12 HISTORY — PX: IR RADIOLOGIST EVAL & MGMT: IMG5224

## 2022-01-15 ENCOUNTER — Other Ambulatory Visit: Payer: Self-pay

## 2022-01-15 ENCOUNTER — Ambulatory Visit: Payer: 59 | Admitting: Occupational Therapy

## 2022-01-15 ENCOUNTER — Ambulatory Visit: Payer: 59 | Admitting: Physical Therapy

## 2022-01-15 ENCOUNTER — Encounter: Payer: Self-pay | Admitting: Occupational Therapy

## 2022-01-15 DIAGNOSIS — R2689 Other abnormalities of gait and mobility: Secondary | ICD-10-CM

## 2022-01-15 DIAGNOSIS — R270 Ataxia, unspecified: Secondary | ICD-10-CM

## 2022-01-15 DIAGNOSIS — R41842 Visuospatial deficit: Secondary | ICD-10-CM

## 2022-01-15 DIAGNOSIS — R260 Ataxic gait: Secondary | ICD-10-CM

## 2022-01-15 DIAGNOSIS — R29818 Other symptoms and signs involving the nervous system: Secondary | ICD-10-CM

## 2022-01-15 DIAGNOSIS — R278 Other lack of coordination: Secondary | ICD-10-CM

## 2022-01-15 DIAGNOSIS — R2681 Unsteadiness on feet: Secondary | ICD-10-CM

## 2022-01-15 DIAGNOSIS — R208 Other disturbances of skin sensation: Secondary | ICD-10-CM

## 2022-01-15 NOTE — Therapy (Signed)
?OUTPATIENT PHYSICAL THERAPY TREATMENT NOTE ? ? ?Patient Name: Maurice Little ?MRN: [id number] ?DOB:06/13/1956, 66 y.o., male ?Today's Date: 01/15/2022 ? ?PCP: de [place], Raymond J, MD ?REFERRING PROVIDER: Charlton AmorAngiulli, Daniel J, PA-C ? ? PT End of Session - 01/15/22 2205   ? ? Visit Number 24   ? Number of Visits 39   ? Date for PT Re-Evaluation 03/08/22   ? Authorization Type UHC ([place] with no auth required)   ? PT Start Time 1150   ? PT Stop Time 1240   ? PT Time Calculation (min) 50 min   ? Equipment Utilized During Treatment --   ? Activity Tolerance Patient tolerated treatment well   ? [facility] for tasks assessed/performed   ? ?  ?  ? ?  ? ? ? ?Past Medical History:  ?Diagnosis Date  ? Benign prostatic hyperplasia with elevated prostate specific antigen (PSA)   ? Hypertension   ? Iliac artery stenosis, left (HCC)   ? with claudication resolved with stent  ? PAD (peripheral artery disease) (HCC)   ? Stroke [facility](HCC)   ? ?Past Surgical History:  ?Procedure Laterality Date  ? BUBBLE STUDY  08/31/2021  ? Procedure: BUBBLE STUDY;  Surgeon: Chilton Siandolph, Tiffany, MD;  [facility];  Service: Cardiovascular;;  ? ILIAC ARTERY STENT Left   ? IR ANGIO VERTEBRAL SEL SUBCLAVIAN INNOMINATE UNI L MOD SED  08/30/2021  ? IR ANGIO VERTEBRAL SEL SUBCLAVIAN INNOMINATE UNI R MOD SED  08/30/2021  ? IR CT HEAD LTD  08/30/2021  ? IR INTRA CRAN STENT  08/30/2021  ? IR RADIOLOGIST EVAL & MGMT  11/28/2021  ? IR RADIOLOGIST EVAL & MGMT  01/12/2022  ? IR US GUIDE VASC ACCESS RIGHT  08/30/2021  ? RADIOLOGY WITH ANESTHESIA N/A 08/30/2021  ? Procedure: IR WITH ANESTHESIA;  Surgeon: Julieanne Cottoneveshwar, Sanjeev, MD;  Location: [facility] OR;  Service: Radiology;  Laterality: N/A;  ? TEE WITHOUT CARDIOVERSION N/A 08/31/2021  ? Procedure: TRANSESOPHAGEAL ECHOCARDIOGRAM (TEE);  Surgeon: Chilton Siandolph, Tiffany, MD;  [facility];  Service: Cardiovascular;  Laterality: N/A;  ? ?Patient Active Problem List  ? Diagnosis Date Noted  ? Familial  hypercholesteremia 11/06/2021  ? PFO (patent foramen ovale) 11/06/2021  ? Urinary retention 09/27/2021  ? Diplopia 09/27/2021  ? Carotid stenosis, right 09/27/2021  ? Anemia 09/27/2021  ? Slow transit constipation   ? Benign essential HTN   ? 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  ? Acute ischemic stroke (HCC)   ? Cerebral embolism with cerebral infarction 08/30/2021  ? 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 ? ?REFERRING DIAG: Cerebellar CVA ? ?THERAPY DIAG:  ?Ataxic gait ? ?Other abnormalities of gait and mobility ? ?Other lack of coordination ? ?Unsteadiness on feet ? ?Other symptoms and signs involving the nervous system ? ?PERTINENT HISTORY: Diplopia, basilar artery stenosis, PFO, cerebral infarction 08/2021, essential HTN ? ?PRECAUTIONS: Fall ? ?SUBJECTIVE: Pt and wife brought in results of VNG testing at [facility]NT.  Results showed no peripheral damage but central causes for vertigo. ? ?PAIN:  ?Are you having pain? No ?  ?TODAY'S TREATMENT: ? ?01/15/2022: ?Patient seen for aquatic therapy today.  Treatment took place in water 3-4 feet deep depending upon activity.  Pt entered the pool via ? ?Continued gait training ambulating forwards and then backwards across the full length of the pool from shallow <> deep without UE support with therapist providing min-mod A for balance when in shallow water.  Pt continued to show increased stance time and step/stride length bilaterally when in deeper water.  Pt had significant difficulty coordinating and sequencing backwards stepping and required max-total cues to sequence stepping and foot placement.  On third lap cued pt to not pick up foot fully from floor but to slide it back along floor.  Pt very mentally fatigued at end of backwards walking.   ? ? Standing at submerged step placed R foot on step and performed alternating forwards and lateral step ups x 10 reps + 10 reps with L foot on step; pt  utilized barbell in front of him stabilized against the wall for UE support/stability.  Pt continued to have greatest difficulty with weight shifting forwards over foot.   ? ?Transitioned to swimming but cued pt to perform free style when swimming in prone: pt performed one lap with head out of water and then changed to head in water.  When patient's head in water pt became very disoriented and began to swim directly to the L.  Changed to supine swimming using beams on ceiling for visual orientation in midline.  Performed 2-3 laps swimming in supine with flotation belt around waist.  Also performed rotation from supine <> prone <> supine going to R and then going to L with EC.  ? ?Pt requires buoyancy of water for support for joint offloading for body weight support and proprioception to reduce fall risk with gait training and balance exercises with minimal UE support; exercises able to be performed safely in water without the risk of fall compared to those same exercises performed on land;  viscosity of water needed for resistance for strengthening.  Current of water provides perturbations for challenging static & dynamic standing balance. ? ? ? ?01/11/22: ?Ambulation/Gait ? Ambulation/Gait Yes  ? Ambulation/Gait Assistance 4: Min guard  ? Ambulation/Gait Assistance Details No device  ? Ambulation Distance (Feet) 115 Feet  ? Assistive device None  ? Gait Pattern Step-through pattern;Ataxic;Decreased weight shift to right;Decreased weight shift to left;Wide base of support  ? Ambulation Surface Level;Indoor  ? Gait velocity 17.59 secs = 1.86 ft/sec with RW  ? ? Standardized Balance Assessment ? Standardized Balance Assessment Berg Balance Test  ? ? Timed Up and Go Test ? TUG Normal TUG  ? Normal TUG (seconds)21.84    Pt performed at increased speed (not his normal walking speed) and needed CGA to min assist for balance, especially with the turn ? ?01/08/2022:   ?Patient seen for aquatic therapy today.  Treatment took  place in water 3-4 feet deep depending upon activity.  Pt entered and exited the pool via stairs with rails. ? ?Initiated with gait training the full length of the pool from shallow <> deep without UE support with therapist providing min-mod A for balance when in shallow water.  Pt continues to require assistance for anterior weight shift, weight shift and increased stance time on RLE and cues to increase step and stride length.  Performed x 3 laps down and back.   ? ?Returned to use of aqua dumbbells in each UE: With UE support performed gait with higher level challenges across length of pool from shallow <> deep: 2 laps tandem gait and 2 laps grapevine cross overs with R then LLE.  During tandem gait stopped in 4 ft water and performed static tandem stance with R then L foot forwards and then performed lateral reaching to L and R <> midline with UE in ABD to promote trunk elongation, head  righting and use of hips for balance reaction training.   ? ?Transitioned to sitting on bench.  Performed Sit<>stand in single leg stance, alternating between R and LLE.  Required mod-max A from PT for balance and cues for full weight shift over each LE.  Increased assistance required to shift weight over RLE and to maintain balance over stance LE once standing; also provided cues to relax ankle of lifted LE to decrease cramping in floating LE. ? ?Continued aerobic conditioning with 2 laps down length of pool without stopping for rest break: Swim prone<>swim supine with tuck and push off wall.  Cues to increase use of LE kicks when swimming. ? ?Pt requires buoyancy of water for support for joint offloading for body weight support and proprioception to reduce fall risk with gait training and balance exercises with minimal UE support; exercises able to be performed safely in water without the risk of fall compared to those same exercises performed on land;  viscosity of water needed for resistance for strengthening.  Current of  water provides perturbations for challenging static & dynamic standing balance. ?  ?PATIENT EDUCATION: ?Education details: difference between peripheral sensory organ of inner ear and central processing of the brain (

## 2022-01-15 NOTE — Therapy (Signed)
[facility], [place], [address] ?Phone: [phone]   Fax:  [phone] ? ?Occupational Therapy Treatment ? ?Patient Details  ?Name: Maurice Little ?MRN: [id number] ?Date of Birth: May 31, 1956 ?Referring Provider (OT): Lauraine Rinne, PA-C ? ? ?Encounter Date: 01/15/2022 ? ? OT End of Session - 01/15/22 0939   ? ? Visit Number 28   ? Number of Visits 32   [phone]  ? Date for OT Re-Evaluation 01/26/22   ? Authorization Type UHC--no visit limit/no auth, copay per day   ? Authorization Time Period renewal completed 12/27/21   ? OT Start Time [phone]   ? OT Stop Time 1015   ? OT Time Calculation (min) 39 min   ? Activity Tolerance Patient tolerated treatment well   ? Behavior During Therapy [facility] for tasks assessed/performed   ? ?  ?  ? ?  ? ? ?Past Medical History:  ?Diagnosis Date  ? Benign prostatic hyperplasia with elevated prostate specific antigen (PSA)   ? Hypertension   ? Iliac artery stenosis, left (HCC)   ? with claudication resolved with stent  ? PAD (peripheral artery disease) ([place])   ? Stroke [facility])   ? ? ?Past Surgical History:  ?Procedure Laterality Date  ? BUBBLE STUDY  08/31/2021  ? Procedure: BUBBLE STUDY;  Surgeon: Skeet Latch, MD;  Location: [place];  Service: Cardiovascular;;  ? ILIAC ARTERY STENT Left   ? IR ANGIO VERTEBRAL SEL SUBCLAVIAN INNOMINATE UNI L MOD SED  08/30/2021  ? IR ANGIO VERTEBRAL SEL SUBCLAVIAN INNOMINATE UNI R MOD SED  08/30/2021  ? IR CT HEAD LTD  08/30/2021  ? IR INTRA CRAN STENT  08/30/2021  ? IR RADIOLOGIST EVAL & MGMT  11/28/2021  ? IR RADIOLOGIST EVAL & MGMT  01/12/2022  ? IR US GUIDE VASC ACCESS RIGHT  08/30/2021  ? RADIOLOGY WITH ANESTHESIA N/A 08/30/2021  ? Procedure: IR WITH ANESTHESIA;  Surgeon: Luanne Bras, MD;  Location: [place];  Service: Radiology;  Laterality: N/A;  ? TEE WITHOUT CARDIOVERSION N/A 08/31/2021  ? Procedure: TRANSESOPHAGEAL ECHOCARDIOGRAM (TEE);  Surgeon: Skeet Latch, MD;  Location: [place];  Service: Cardiovascular;  Laterality: N/A;  ? ? ?There were no vitals filed for this visit. ? ? Subjective Assessment - 01/15/22 0933   ? ? Subjective  turned in to my laptop (from work).  Pt reports that he put groceries away by himself.   ? Pertinent History CVA (R cereballar and R small R brachium potis and R pontomedullary junction).   PMH:  dyslipidemia, HTN, carotid stenosis, anemia, PAD   ? Limitations ataxia, diplopia, folley catheter--d/c, decr hearing (particularly R side)   ? Patient Stated Goals incr independence, get better   ? Currently in Pain? No/denies   ? Pain Onset More than a month ago   ? ?  ?  ? ?  ? ? ?Sitting, functional reaching with RUE to place small pegs in vertical pegboard to copy design for incr coordination, activity tolerance/functional reach. ? ?Yellow theraband exercises for shoulder horizontal abduction (low range), scapular retraction, and ER with BUEs x15 each. ? ?Ambulating with RW while performing environmental scanning in mod busy environment with 93% accuracy (14/15 items found with first pass, remaining found on 2nd pass).   ? ? ? ? ? ? ? OT Short Term Goals - 01/10/22 1137   ? ?  ? OT SHORT TERM GOAL #1  ? Title Pt will be independent with initial HEP.--check STGs  11/02/21   ? Time 4   ? Period Weeks   ? Status Achieved   ?  ? OT SHORT TERM GOAL #2  ? Title Pt will perform BADLs mod I except supervision for shower transfer.   ? Time 4   ? Period Weeks   ? Status Achieved   min A for back, supervision for standing to pull up pants.  11/20/21:  supervision for ambulation for toileting (mod I for tolieting at night with [facility]), supervision for shower transfer.  12/04/21:  now ambulating to restroom mod I  ?  ? OT SHORT TERM GOAL #3  ? Title Pt will perform tabletop visual scanning with 90% accuracy and no reports of diplopia.   ? Time 4   ? Period Weeks   ? Status Achieved   ?  ? OT SHORT TERM GOAL #4  ? Title Pt will verbalize understanding of  visual compensation strategies for ADLs/IADLs prn (including ?taping, head turns/positioning)   ? Time 4   ? Period Weeks   ? Status Achieved   Pt/ wife verbalize understanding  ?  ? OT SHORT TERM GOAL #5  ? Title Pt will improve coordination for ADLs as shown by improving time on 9-hole peg test by at least 5sec bilaterally.   ? Baseline R-66.85sec, L-41.25sec   ? Time 4   ? Period Weeks   ? Status Achieved   R 65.25-not met yet improvedd, L 33.60- met partially met, met for LUE.  11/20/21:  R-51.75sec  ? ?  ?  ? ?  ? ? ? ? OT Long Term Goals - 01/15/22 0947   ? ?  ? OT LONG TERM GOAL #1  ? Title Pt will be independent with updated HEP.   ? Time 12   ? Period Weeks   ? Status Achieved   12/25/21:  not fully met/not performing consistently.  01/15/22:  met  ?  ? OT LONG TERM GOAL #2  ? Title Pt will perform simple snack prep/home maintenance task with supervision.--11/29/21 met initial goal, updated 11/29/21:  Pt will peform simple cooking and home maintenance tasks mod I.   ? Time 12   ? Period Weeks   ? Status On-going   12/25/21:  ongoing, met initial goal, but has not met updated goal.  01/08/22:  snack prep mod I.  01/10/22:  simple cooking task supervision-mod I  ?  ? OT LONG TERM GOAL #3  ? Title Pt will perform environmental scanning/navigation with at least 90% accuracy for incr safety in the home/community.   ? Time 12   ? Period Weeks   ? Status Achieved   12/18/21:  CGA with 93% accuracy in mod distracting environment.  01/15/22:  93% accuracy in mod distracting envionment mod I with RW  ?  ? OT LONG TERM GOAL #4  ? Title Pt will improve RUE coordination as shown by improving time on 9-hole peg test by at least 15sec.--met.  11/27/21 upgraded:  Pt will complete 9-hole peg test in 45sec or less with RUE.   ? Baseline 66.85sec   ? Time 12   ? Period Weeks   ? Status Revised   12/25/21:  55.57sec  ?  ? OT LONG TERM GOAL #5  ? Title Pt will be able to retrieve/replace 1-2 objects on overhead shelf with each RUE x10  safetly and with good control for IADL tasks without pain.   ? Time 12   ? Period Weeks   ?  Status Revised   12/25/21:  can place lightweight objects <1lb, but needs cueing/facilitation for positioning and reports 1-2/10 R shoulder pain, able to perform with LUE (revised 12/25/21)  ?  ? OT LONG TERM GOAL #6  ? Title --   ? Baseline --   ? Time --   ? Period --   ? Status --   ?  ? OT LONG TERM GOAL #7  ? Title Pt will be able to write at least 4 sentences with 100% legibility in reasonable amount of time.   ? Time 12   ? Period Weeks   ? Status On-going   11/29/21:  approx 90% legibility.  12/25/21:  approx 75% legibility  ? ?  ?  ? ?  ? ? ? ? ? ? ? ? Plan - 01/15/22 0940   ? ? Clinical Impression Statement Pt continues to progress with functional performance with reach and coordination and environmental scanning/balance.   ? OT Occupational Profile and History Detailed Assessment- Review of Records and additional review of physical, cognitive, psychosocial history related to current functional performance   ? Occupational performance deficits (Please refer to evaluation for details): ADL's;IADL's;Work;Leisure   ? Body Structure / Function / Physical Skills ADL;UE functional use;Balance;FMC;Coordination;Sensation;IADL;Decreased knowledge of use of DME;Dexterity;Vision   ? Rehab Potential Good   ? Clinical Decision Making Several treatment options, min-mod task modification necessary   ? Comorbidities Affecting Occupational Performance: May have comorbidities impacting occupational performance   ? Modification or Assistance to Complete Evaluation  Min-Moderate modification of tasks or assist with assess necessary to complete eval   ? OT Frequency 2x / week   ? OT Duration 12 weeks   +eval  ? OT Treatment/Interventions Self-care/ADL training;Energy conservation;Ultrasound;Visual/perceptual remediation/compensation;Patient/family education;DME and/or AE instruction;Aquatic Therapy;Paraffin;Passive range of motion;Balance  training;Cryotherapy;Functional Mobility Training;Moist Heat;Therapeutic exercise;Manual Therapy;Therapeutic activities;Neuromuscular education   ? Plan continue to address IADL performance/balance, coordination

## 2022-01-17 ENCOUNTER — Other Ambulatory Visit: Payer: Self-pay

## 2022-01-17 ENCOUNTER — Other Ambulatory Visit: Payer: Self-pay | Admitting: Physical Medicine and Rehabilitation

## 2022-01-17 ENCOUNTER — Ambulatory Visit: Payer: 59 | Admitting: Physical Therapy

## 2022-01-17 ENCOUNTER — Ambulatory Visit: Payer: 59 | Admitting: Occupational Therapy

## 2022-01-17 ENCOUNTER — Encounter: Payer: Self-pay | Admitting: Occupational Therapy

## 2022-01-17 DIAGNOSIS — R29818 Other symptoms and signs involving the nervous system: Secondary | ICD-10-CM

## 2022-01-17 DIAGNOSIS — R278 Other lack of coordination: Secondary | ICD-10-CM

## 2022-01-17 DIAGNOSIS — R208 Other disturbances of skin sensation: Secondary | ICD-10-CM

## 2022-01-17 DIAGNOSIS — R2689 Other abnormalities of gait and mobility: Secondary | ICD-10-CM

## 2022-01-17 DIAGNOSIS — R41842 Visuospatial deficit: Secondary | ICD-10-CM

## 2022-01-17 DIAGNOSIS — R2681 Unsteadiness on feet: Secondary | ICD-10-CM

## 2022-01-17 DIAGNOSIS — R270 Ataxia, unspecified: Secondary | ICD-10-CM

## 2022-01-17 NOTE — Therapy (Signed)
[facility] [address] [place], [place], [address] Phone: [phone]   Fax:  [phone]  Occupational Therapy Treatment  Patient Details  Name: Maurice Little MRN: [id number] Date of Birth: 12/31/1955 Referring Provider (OT): Mariam Dollar, PA-C   Encounter Date: 01/17/2022   OT End of Session - 01/17/22 0937     Visit Number 29    Number of Visits 32   24+8=32   Date for OT Re-Evaluation 01/26/22    Authorization Type UHC--no visit limit/no auth, copay per day    Authorization Time Period renewal completed 12/27/21    OT Start Time 0933    OT Stop Time 1013    OT Time Calculation (min) 40 min    Activity Tolerance Patient tolerated treatment well    Behavior During Therapy [facility] for tasks assessed/performed             Past Medical History:  Diagnosis Date   Benign prostatic hyperplasia with elevated prostate specific antigen (PSA)    Hypertension    Iliac artery stenosis, left (HCC)    with claudication resolved with stent   PAD (peripheral artery disease) (HCC)    Stroke [facility])     Past Surgical History:  Procedure Laterality Date   BUBBLE STUDY  08/31/2021   Procedure: BUBBLE STUDY;  Surgeon: Chilton Si, MD;  Location: [facility] ENDOSCOPY;  Service: Cardiovascular;;   ILIAC ARTERY STENT Left    IR ANGIO VERTEBRAL SEL SUBCLAVIAN INNOMINATE UNI L MOD SED  08/30/2021   IR ANGIO VERTEBRAL SEL SUBCLAVIAN INNOMINATE UNI R MOD SED  08/30/2021   IR CT HEAD LTD  08/30/2021   IR INTRA CRAN STENT  08/30/2021   IR RADIOLOGIST EVAL & MGMT  11/28/2021   IR RADIOLOGIST EVAL & MGMT  01/12/2022   IR US GUIDE VASC ACCESS RIGHT  08/30/2021   RADIOLOGY WITH ANESTHESIA N/A 08/30/2021   Procedure: IR WITH ANESTHESIA;  Surgeon: Julieanne Cotton, MD;  Location: [facility] OR;  Service: Radiology;  Laterality: N/A;   TEE WITHOUT CARDIOVERSION N/A 08/31/2021   Procedure: TRANSESOPHAGEAL ECHOCARDIOGRAM (TEE);  Surgeon: Chilton Si, MD;  Location: [facility] ENDOSCOPY;  Service: Cardiovascular;  Laterality: N/A;    There were no vitals filed for this visit.   Subjective Assessment - 01/17/22 0937     Subjective  pt denies pain    Pertinent History CVA (R cereballar and R small R brachium potis and R pontomedullary junction).   PMH:  dyslipidemia, HTN, carotid stenosis, anemia, PAD    Limitations ataxia, diplopia, folley catheter--d/c, decr hearing (particularly R side)    Patient Stated Goals incr independence, get better    Currently in Pain? No/denies    Pain Onset More than a month ago              Sitting, closed-chain shoulder flex and diagonals to each side with BUEs with ball with 1lb wt. On each wrist x10 each with min v.c.  Completing purdue pegboard with R hand with min difficulty/incr time for coordination.  Copying common words with approx 75% legibility.  Trial with built up grip with some improvement (issued one for use at home).  Recommended using colored pencils to color at home (as well as tracing, and working on writing).  Pt/wife verbalized understanding.  Arm bike x17min level 6 for reciprocal movement and conditioning, forward/backwards without rest.        OT Short Term Goals - 01/10/22 1137       OT  SHORT TERM GOAL #1   Title Pt will be independent with initial HEP.--check STGs 11/02/21    Time 4    Period Weeks    Status Achieved      OT SHORT TERM GOAL #2   Title Pt will perform BADLs mod I except supervision for shower transfer.    Time 4    Period Weeks    Status Achieved   min A for back, supervision for standing to pull up pants.  11/20/21:  supervision for ambulation for toileting (mod I for tolieting at night with [facility]), supervision for shower transfer.  12/04/21:  now ambulating to restroom mod I     OT SHORT TERM GOAL #3   Title Pt will perform tabletop visual scanning with 90% accuracy and no reports of diplopia.    Time 4    Period Weeks    Status Achieved       OT SHORT TERM GOAL #4   Title Pt will verbalize understanding of visual compensation strategies for ADLs/IADLs prn (including ?taping, head turns/positioning)    Time 4    Period Weeks    Status Achieved   Pt/ wife verbalize understanding     OT SHORT TERM GOAL #5   Title Pt will improve coordination for ADLs as shown by improving time on 9-hole peg test by at least 5sec bilaterally.    Baseline R-66.85sec, L-41.25sec    Time 4    Period Weeks    Status Achieved   R 65.25-not met yet improvedd, L 33.60- met partially met, met for LUE.  11/20/21:  R-51.75sec              OT Long Term Goals - 01/15/22 0947       OT LONG TERM GOAL #1   Title Pt will be independent with updated HEP.    Time 12    Period Weeks    Status Achieved   12/25/21:  not fully met/not performing consistently.  01/15/22:  met     OT LONG TERM GOAL #2   Title Pt will perform simple snack prep/home maintenance task with supervision.--11/29/21 met initial goal, updated 11/29/21:  Pt will peform simple cooking and home maintenance tasks mod I.    Time 12    Period Weeks    Status On-going   12/25/21:  ongoing, met initial goal, but has not met updated goal.  01/08/22:  snack prep mod I.  01/10/22:  simple cooking task supervision-mod I     OT LONG TERM GOAL #3   Title Pt will perform environmental scanning/navigation with at least 90% accuracy for incr safety in the home/community.    Time 12    Period Weeks    Status Achieved   12/18/21:  CGA with 93% accuracy in mod distracting environment.  01/15/22:  93% accuracy in mod distracting envionment mod I with RW     OT LONG TERM GOAL #4   Title Pt will improve RUE coordination as shown by improving time on 9-hole peg test by at least 15sec.--met.  11/27/21 upgraded:  Pt will complete 9-hole peg test in 45sec or less with RUE.    Baseline 66.85sec    Time 12    Period Weeks    Status Revised   12/25/21:  55.57sec     OT LONG TERM GOAL #5   Title Pt will be able to  retrieve/replace 1-2 objects on overhead shelf with each RUE x10 safetly and with good control for  IADL tasks without pain.    Time 12    Period Weeks    Status Revised   12/25/21:  can place lightweight objects <1lb, but needs cueing/facilitation for positioning and reports 1-2/10 R shoulder pain, able to perform with LUE (revised 12/25/21)     OT LONG TERM GOAL #6   Title --    Baseline --    Time --    Period --    Status --      OT LONG TERM GOAL #7   Title Pt will be able to write at least 4 sentences with 100% legibility in reasonable amount of time.    Time 12    Period Weeks    Status On-going   11/29/21:  approx 90% legibility.  12/25/21:  approx 75% legibility                  Plan - 01/17/22 0937     Clinical Impression Statement Pt continues to progress with functional performance with reach and coordination and balance.    OT Occupational Profile and History Detailed Assessment- Review of Records and additional review of physical, cognitive, psychosocial history related to current functional performance    Occupational performance deficits (Please refer to evaluation for details): ADL's;IADL's;Work;Leisure    Body Structure / Function / Physical Skills ADL;UE functional use;Balance;FMC;Coordination;Sensation;IADL;Decreased knowledge of use of DME;Dexterity;Vision    Rehab Potential Good    Clinical Decision Making Several treatment options, min-mod task modification necessary    Comorbidities Affecting Occupational Performance: May have comorbidities impacting occupational performance    Modification or Assistance to Complete Evaluation  Min-Moderate modification of tasks or assist with assess necessary to complete eval    OT Frequency 2x / week    OT Duration 12 weeks   +eval   OT Treatment/Interventions Self-care/ADL training;Energy conservation;Ultrasound;Visual/perceptual remediation/compensation;Patient/family education;DME and/or AE instruction;Aquatic  Therapy;Paraffin;Passive range of motion;Balance training;Cryotherapy;Functional Mobility Training;Moist Heat;Therapeutic exercise;Manual Therapy;Therapeutic activities;Neuromuscular education    Plan check goals and anticipate d/c next week    OT Home Exercise Plan Pt reports that he has some coordination activities and visual HEP from hospital    Consulted and Agree with Plan of Care Patient;Family member/caregiver    Family Member Consulted wife             Patient will benefit from skilled therapeutic intervention in order to improve the following deficits and impairments:   Body Structure / Function / Physical Skills: ADL, UE functional use, Balance, FMC, Coordination, Sensation, IADL, Decreased knowledge of use of DME, Dexterity, Vision       Visit Diagnosis: Other lack of coordination  Unsteadiness on feet  Other symptoms and signs involving the nervous system  Ataxia  Visuospatial deficit  Other disturbances of skin sensation    Problem List Patient Active Problem List   Diagnosis Date Noted   Familial hypercholesteremia 11/06/2021   PFO (patent foramen ovale) 11/06/2021   Urinary retention 09/27/2021   Diplopia 09/27/2021   Carotid stenosis, right 09/27/2021   Anemia 09/27/2021   Slow transit constipation    Benign essential HTN    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   Acute ischemic stroke [facility])    Cerebral embolism with cerebral infarction 08/30/2021   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 [place], [place]  [address] [phone]  phone [phone] 01/17/22 9:39 AM

## 2022-01-17 NOTE — Therapy (Signed)
?OUTPATIENT PHYSICAL THERAPY TREATMENT NOTE ? ? ?Patient Name: Maurice Little ?MRN: [id number] ?DOB:1956-06-18, 66 y.o., male ?Today's Date: 01/17/2022 ? ?PCP: de [place], Raymond J, MD ?REFERRING PROVIDER: Cathlyn Parsons, PA-C ? ? PT End of Session - 01/17/22 1235   ? ? Visit Number 25   ? Number of Visits 39   ? Date for PT Re-Evaluation 03/08/22   ? Authorization Type UHC ([place] with no auth required)   ? PT Start Time 1016   ? PT Stop Time 8811   ? PT Time Calculation (min) 47 min   ? Equipment Utilized During Treatment Gait belt   ? Activity Tolerance Patient tolerated treatment well   ? Behavior During Therapy [facility] for tasks assessed/performed   ? ?  ?  ? ?  ? ? ? ? ?Past Medical History:  ?Diagnosis Date  ? Benign prostatic hyperplasia with elevated prostate specific antigen (PSA)   ? Hypertension   ? Iliac artery stenosis, left (HCC)   ? with claudication resolved with stent  ? PAD (peripheral artery disease) ([place])   ? Stroke [facility])   ? ?Past Surgical History:  ?Procedure Laterality Date  ? BUBBLE STUDY  08/31/2021  ? Procedure: BUBBLE STUDY;  Surgeon: Skeet Latch, MD;  Location: [place];  Service: Cardiovascular;;  ? ILIAC ARTERY STENT Left   ? IR ANGIO VERTEBRAL SEL SUBCLAVIAN INNOMINATE UNI L MOD SED  08/30/2021  ? IR ANGIO VERTEBRAL SEL SUBCLAVIAN INNOMINATE UNI R MOD SED  08/30/2021  ? IR CT HEAD LTD  08/30/2021  ? IR INTRA CRAN STENT  08/30/2021  ? IR RADIOLOGIST EVAL & MGMT  11/28/2021  ? IR RADIOLOGIST EVAL & MGMT  01/12/2022  ? IR US GUIDE VASC ACCESS RIGHT  08/30/2021  ? RADIOLOGY WITH ANESTHESIA N/A 08/30/2021  ? Procedure: IR WITH ANESTHESIA;  Surgeon: Luanne Bras, MD;  Location: [place];  Service: Radiology;  Laterality: N/A;  ? TEE WITHOUT CARDIOVERSION N/A 08/31/2021  ? Procedure: TRANSESOPHAGEAL ECHOCARDIOGRAM (TEE);  Surgeon: Skeet Latch, MD;  Location: [place];  Service: Cardiovascular;  Laterality: N/A;  ? ?Patient Active Problem List  ? Diagnosis Date Noted  ? Familial  hypercholesteremia 11/06/2021  ? PFO (patent foramen ovale) 11/06/2021  ? Urinary retention 09/27/2021  ? Diplopia 09/27/2021  ? Carotid stenosis, right 09/27/2021  ? Anemia 09/27/2021  ? Slow transit constipation   ? Benign essential HTN   ? Cerebellar stroke ([place]) 09/04/2021  ? Acute ischemic stroke ([place])   ? Cerebral embolism with cerebral infarction 08/30/2021  ? 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 ? ?REFERRING DIAG: Cerebellar CVA ? ?THERAPY DIAG:  ?Unsteadiness on feet ? ?Other abnormalities of gait and mobility ? ?Other lack of coordination ? ?PERTINENT HISTORY: Diplopia, basilar artery stenosis, PFO, cerebral infarction 08/2021, essential HTN ? ?PRECAUTIONS: Fall ? ?SUBJECTIVE: Pt and wife brought in results of VNG testing at [facility]NT.  Results showed no peripheral damage but central causes for vertigo. Pt and wife inquired about whether obtaining hearing aid for R ear would be helpful for central lesion, advised them to pursue hearing aid for improved QOL.  ? ?PAIN:  ?Are you having pain? No ?  ?TODAY'S TREATMENT: ?  Self-care/home management ?Lengthy discussion with pt and wife regarding results and interpretation of VNG. Pt verbalized frustration regarding not feeling as though his dizziness and balance are improving, provided emotional support and encouragement regarding pt's progress in therapy so far.  All of wife's questions addressed and obtained second therapist's opinion regarding pursuing hearing aids, who also encouraged pt to pursue obtaining them.  ?   ?  Gait Training/NMR  ?Gait pattern: step through pattern, decreased arm swing- Right, decreased step length- Right, decreased step length- Left, decreased stance time- Right, scissoring, ataxic, decreased trunk rotation, and poor foot clearance- Right ?Distance walked: 115' x3  ?Assistive device utilized: None ?Level of assistance: Min A and Mod A ?Comments: 9'  normal gait w/min A, noted asymmetrical step length of LLE and inconsistent step placement of RLE with occasional scissoring of gait resulting in posterolateral LOB to R side. Challenged pt to take more symmetrical steps by having him count aloud w/each step (up to 10) and attempting to maintain a consistent rhythm with his steps. Pt demonstrated significant difficulty with single leg stance on RLE and could not perform dual-task. Regressed to therapist counting aloud for pt and tasking pt to take a step with each number, which he was able to perform w/min-mod A. Noted that pt's BOS and stance phase time of RLE increased w/counting.  ? ?-Fwd step w/contralateral dot tap for BLE coordination, sequencing and single leg stability. Completed forward step w/contralateral foot tapping dot during swing phase, 2x4 per side. Mod A throughout for posterolateral LOB to R side and max verbal cues for sequencing.  ? ? ?   ? ?PATIENT EDUCATION: ?Education details: Plan for pool vs land visits, CNS vs PNS lesions, advice regarding obtaining hearing aid for R ear  ?Person educated: Patient, Spouse ?Education method: Explanation ?Education comprehension: verbalized understanding ?  ? PT Short Term Goals - 01/11/22 1017   ? ?  ? PT SHORT TERM GOAL #1  ? Title Improve Berg score from 38/56 to >/= 41/56 to decrease fall risk.   ? Baseline score 38/56 on 01-10-22   ? Time 4   ? Period Weeks   ? Status New   ? Target Date 02/08/22   ?  ? PT SHORT TERM GOAL #2  ? Title Improve TUG score from 21.84 secs with RW to </= 19 secs with RW with SBA only (no physical assist needed) to increase safety with ambulation.   ? Baseline 21.84 secs with RW with CGA to min assist - ataxia in RLE   ? Time 4   ? Period Weeks   ? Status New   ? Target Date 02/08/22   ?  ? PT SHORT TERM GOAL #3  ? Title Pt will amb. 230' without device with CGA on flat, even surface for increased community accessibility.   ? Baseline --   ? Time 4   ? Period Weeks   ? Status  New   ? Target Date 02/08/22   ?  ? PT SHORT TERM GOAL #4  ? Title Pt will negotiate 4 steps with use of bil. hand rails with SBA   ? Baseline --   ? Time 4   ? Period Weeks   ? Status New   ? Target Date 02/08/22   ?  ? PT SHORT TERM GOAL #5  ? Title Independent in updated HEP for balance exercises and pt will report performing HEP on regular basis.   ? Baseline --   ? Time 4   ? Period Weeks   ? Status New   ? Target Date 02/08/22   ?  ? PT SHORT TERM GOAL #6  ? Title --   ? Baseline --   ?  Time --   ? Period --   ? Status --   ? Target Date --   ? ?  ?  ? ?  ? ? PT Long Term Goals - 01/10/22 1158   ? ?  ? PT LONG TERM GOAL #1  ? Title Pt will be modified independent with household amb. without use of assistive device.   ? Baseline Pt reports he is walking in the house with the RW independently   ? Time 8   ? Period Weeks   ? Status On-going   ? Target Date 03/08/22   ?  ? PT LONG TERM GOAL #2  ? Title Pt will amb. without device with CGA 500' for increased community accessibility.   ? Time 8   ? Period Weeks   ? Status Not Met   ? Target Date 03/08/22   ?  ? PT LONG TERM GOAL #3  ? Title Improve TUG score to </= 20 secs with appropriate assistive device to demo improved functional mobility and to reduce fall risk.   ? Baseline 1".07 secs with RW - 10-16-21;  TUG with RW- 22.97 secs,   21.84 secs without RW - needs CGA for safety   ? Time 8   ? Period Weeks   ? Status New   ? Target Date 03/08/22   ?  ? PT LONG TERM GOAL #4  ? Title Increase Berg score to >/= 45/56 to reduce fall risk.   ? Baseline 27/56;     01-10-22 = 38/56   ? Time 8   ? Period Weeks   ? Status On-going   ? Target Date 03/08/22   ?  ? PT LONG TERM GOAL #5  ? Title Increase gait velocity from .43 ft/sec to >/= 2.0 ft/sec with RW for increased gait efficiency.   ? Baseline 17.59 secs = 1.86 ft/sec with RW   ? Time 8   ? Period Weeks   ? Status New   ? Target Date 03/08/22   ?  ? Additional Long Term Goals  ? Additional Long Term Goals Yes   ?  ?  PT LONG TERM GOAL #6  ? Title Independent in updated HEP as appropriate.   ? Baseline met 01-10-22   ? Time 12   ? Period Weeks   ? Status Achieved   ? Target Date 01/11/22   ?  ? PT LONG TERM GOAL #7  ? Title

## 2022-01-22 ENCOUNTER — Ambulatory Visit: Payer: 59 | Admitting: Occupational Therapy

## 2022-01-22 ENCOUNTER — Encounter: Payer: Self-pay | Admitting: Occupational Therapy

## 2022-01-22 ENCOUNTER — Ambulatory Visit: Payer: 59 | Admitting: Physical Therapy

## 2022-01-22 ENCOUNTER — Other Ambulatory Visit: Payer: Self-pay

## 2022-01-22 DIAGNOSIS — R29818 Other symptoms and signs involving the nervous system: Secondary | ICD-10-CM

## 2022-01-22 DIAGNOSIS — R278 Other lack of coordination: Secondary | ICD-10-CM | POA: Diagnosis not present

## 2022-01-22 DIAGNOSIS — R260 Ataxic gait: Secondary | ICD-10-CM

## 2022-01-22 DIAGNOSIS — R41842 Visuospatial deficit: Secondary | ICD-10-CM

## 2022-01-22 DIAGNOSIS — R2689 Other abnormalities of gait and mobility: Secondary | ICD-10-CM

## 2022-01-22 DIAGNOSIS — R2681 Unsteadiness on feet: Secondary | ICD-10-CM

## 2022-01-22 DIAGNOSIS — R270 Ataxia, unspecified: Secondary | ICD-10-CM

## 2022-01-22 DIAGNOSIS — R208 Other disturbances of skin sensation: Secondary | ICD-10-CM

## 2022-01-22 NOTE — Therapy (Signed)
[facility] ?[place], [place], [address] ?Phone: [phone]   Fax:  [phone] ? ?Occupational Therapy Treatment ? ?Patient Details  ?Name: Jaelynn Currier ?MRN: [id number] ?Date of Birth: [birth year] ?Referring Provider (OT): Lauraine Rinne, PA-C ? ? ?Encounter Date: 01/22/2022 ? ? OT End of Session - 01/22/22 0937   ? ? Visit Number 30   ? Number of Visits 32   [phone]  ? Date for OT Re-Evaluation 01/26/22   ? Authorization Type UHC--no visit limit/no auth, copay per day   ? Authorization Time Period renewal completed 12/27/21   ? OT Start Time [id number]   ? OT Stop Time 1015   ? OT Time Calculation (min) 42 min   ? Activity Tolerance Patient tolerated treatment well   ? Behavior During Therapy [facility] for tasks assessed/performed   ? ?  ?  ? ?  ? ? ?Past Medical History:  ?Diagnosis Date  ? Benign prostatic hyperplasia with elevated prostate specific antigen (PSA)   ? Hypertension   ? Iliac artery stenosis, left (HCC)   ? with claudication resolved with stent  ? PAD (peripheral artery disease) ([place])   ? Stroke [facility])   ? ? ?Past Surgical History:  ?Procedure Laterality Date  ? BUBBLE STUDY  08/31/2021  ? Procedure: BUBBLE STUDY;  Surgeon: Skeet Latch, MD;  Location: [facility];  Service: Cardiovascular;;  ? ILIAC ARTERY STENT Left   ? IR ANGIO VERTEBRAL SEL SUBCLAVIAN INNOMINATE UNI L MOD SED  08/30/2021  ? IR ANGIO VERTEBRAL SEL SUBCLAVIAN INNOMINATE UNI R MOD SED  08/30/2021  ? IR CT HEAD LTD  08/30/2021  ? IR INTRA CRAN STENT  08/30/2021  ? IR RADIOLOGIST EVAL & MGMT  11/28/2021  ? IR RADIOLOGIST EVAL & MGMT  01/12/2022  ? IR US GUIDE VASC ACCESS RIGHT  08/30/2021  ? RADIOLOGY WITH ANESTHESIA N/A 08/30/2021  ? Procedure: IR WITH ANESTHESIA;  Surgeon: Luanne Bras, MD;  Location: [facility];  Service: Radiology;  Laterality: N/A;  ? TEE WITHOUT CARDIOVERSION N/A 08/31/2021  ? Procedure: TRANSESOPHAGEAL ECHOCARDIOGRAM (TEE);  Surgeon: Skeet Latch, MD;  Location: [facility];  Service: Cardiovascular;  Laterality: N/A;  ? ? ?There were no vitals filed for this visit. ? ? Subjective Assessment - 01/22/22 0937   ? ? Subjective  my shoulder is a lot better   ? Pertinent History CVA (R cereballar and R small R brachium potis and R pontomedullary junction).   PMH:  dyslipidemia, HTN, carotid stenosis, anemia, PAD   ? Limitations ataxia, diplopia, folley catheter--d/c, decr hearing (particularly R side)   ? Patient Stated Goals incr independence, get better   ? Currently in Pain? No/denies   ? Pain Onset More than a month ago   ? ?  ?  ? ?  ? ? ? ?Placing grooved pegs in pegboard with R hand with min difficulty due to ataxia.  Removing pegs 3 at a time using in-hand manipulation.   ? ?Standing and performing wt. Bearing through [place] with forward/backward weight shifts. ? ?Standing, functional reaching outside base of support with lateral wt. Shift/trunk rotation for incr balance and overhead functional reach with RUE with min difficulty/incr time (needed LUE support with wt. Shift to the L).   ? ?Practiced writing alphabet (upper and lower case letters) with good legibility today with incr time. ? ? ? ? ? ? ? ? OT Short Term Goals - 01/10/22 1137   ? ?  ? OT  SHORT TERM GOAL #1  ? Title Pt will be independent with initial HEP.--check STGs 11/02/21   ? Time 4   ? Period Weeks   ? Status Achieved   ?  ? OT SHORT TERM GOAL #2  ? Title Pt will perform BADLs mod I except supervision for shower transfer.   ? Time 4   ? Period Weeks   ? Status Achieved   min A for back, supervision for standing to pull up pants.  11/20/21:  supervision for ambulation for toileting (mod I for tolieting at night with [facility]), supervision for shower transfer.  12/04/21:  now ambulating to restroom mod I  ?  ? OT SHORT TERM GOAL #3  ? Title Pt will perform tabletop visual scanning with 90% accuracy and no reports of diplopia.   ? Time 4   ? Period Weeks   ? Status Achieved   ?  ? OT SHORT  TERM GOAL #4  ? Title Pt will verbalize understanding of visual compensation strategies for ADLs/IADLs prn (including ?taping, head turns/positioning)   ? Time 4   ? Period Weeks   ? Status Achieved   Pt/ wife verbalize understanding  ?  ? OT SHORT TERM GOAL #5  ? Title Pt will improve coordination for ADLs as shown by improving time on 9-hole peg test by at least 5sec bilaterally.   ? Baseline R-66.85sec, L-41.25sec   ? Time 4   ? Period Weeks   ? Status Achieved   R 65.25-not met yet improvedd, L 33.60- met partially met, met for LUE.  11/20/21:  R-51.75sec  ? ?  ?  ? ?  ? ? ? ? OT Long Term Goals - 01/22/22 1015   ? ?  ? OT LONG TERM GOAL #1  ? Title Pt will be independent with updated HEP.   ? Time 12   ? Period Weeks   ? Status Achieved   12/25/21:  not fully met/not performing consistently.  01/15/22:  met  ?  ? OT LONG TERM GOAL #2  ? Title Pt will perform simple snack prep/home maintenance task with supervision.--11/29/21 met initial goal, updated 11/29/21:  Pt will peform simple cooking and home maintenance tasks mod I.   ? Time 12   ? Period Weeks   ? Status On-going   12/25/21:  ongoing, met initial goal, but has not met updated goal.  01/08/22:  snack prep mod I.  01/10/22:  simple cooking task supervision-mod I  ?  ? OT LONG TERM GOAL #3  ? Title Pt will perform environmental scanning/navigation with at least 90% accuracy for incr safety in the home/community.   ? Time 12   ? Period Weeks   ? Status Achieved   12/18/21:  CGA with 93% accuracy in mod distracting environment.  01/15/22:  93% accuracy in mod distracting envionment mod I with RW  ?  ? OT LONG TERM GOAL #4  ? Title Pt will improve RUE coordination as shown by improving time on 9-hole peg test by at least 15sec.--met.  11/27/21 upgraded:  Pt will complete 9-hole peg test in 45sec or less with RUE.   ? Baseline 66.85sec   ? Time 12   ? Period Weeks   ? Status Revised   12/25/21:  55.57sec.  01/22/22:  68.34sec  ?  ? OT LONG TERM GOAL #5  ? Title Pt will be  able to retrieve/replace 1-2 objects on overhead shelf with each RUE x10 safetly and  with good control for IADL tasks without pain.   ? Time 12   ? Period Weeks   ? Status Revised   12/25/21:  can place lightweight objects <1lb, but needs cueing/facilitation for positioning and reports 1-2/10 R shoulder pain, able to perform with LUE (revised 12/25/21)  ?  ? OT LONG TERM GOAL #6  ? Title --   ?  ? OT LONG TERM GOAL #7  ? Title Pt will be able to write at least 4 sentences with 100% legibility in reasonable amount of time.   ? Time 12   ? Period Weeks   ? Status On-going   11/29/21:  approx 90% legibility.  12/25/21:  approx 75% legibility  ? ?  ?  ? ?  ? ? ? ? ? ? ? ? Plan - 01/22/22 0938   ? ? Clinical Impression Statement Pt continues to progress with functional performance with RUE functional reach and coordination and balance.   ? OT Occupational Profile and History Detailed Assessment- Review of Records and additional review of physical, cognitive, psychosocial history related to current functional performance   ? Occupational performance deficits (Please refer to evaluation for details): ADL's;IADL's;Work;Leisure   ? Body Structure / Function / Physical Skills ADL;UE functional use;Balance;FMC;Coordination;Sensation;IADL;Decreased knowledge of use of DME;Dexterity;Vision   ? Rehab Potential Good   ? Clinical Decision Making Several treatment options, min-mod task modification necessary   ? Comorbidities Affecting Occupational Performance: May have comorbidities impacting occupational performance   ? Modification or Assistance to Complete Evaluation  Min-Moderate modification of tasks or assist with assess necessary to complete eval   ? OT Frequency 2x / week   ? OT Duration 12 weeks   +eval  ? OT Treatment/Interventions Self-care/ADL training;Energy conservation;Ultrasound;Visual/perceptual remediation/compensation;Patient/family education;DME and/or AE instruction;Aquatic Therapy;Paraffin;Passive range of  motion;Balance training;Cryotherapy;Functional Mobility Training;Moist Heat;Therapeutic exercise;Manual Therapy;Therapeutic activities;Neuromuscular education   ? Plan check goals and anticipate d/c next visit   ? OT

## 2022-01-22 NOTE — Therapy (Signed)
?OUTPATIENT PHYSICAL THERAPY TREATMENT NOTE ? ? ?Patient Name: Maurice Little ?MRN: [id number] ?DOB:06-Oct-1956, 66 y.o., male ?Today's Date: 01/22/2022 ? ?PCP: de [place], Raymond J, MD ?REFERRING PROVIDER: Charlton Amor, PA-C ? ? PT End of Session - 01/22/22 1312   ? ? Visit Number 26   ? Number of Visits 39   ? Date for PT Re-Evaluation 03/08/22   ? Authorization Type UHC ([place] with no auth required)   ? PT Start Time 1100   ? PT Stop Time 1145   ? PT Time Calculation (min) 45 min   ? Activity Tolerance Patient tolerated treatment well   ? Behavior During Therapy [facility] for tasks assessed/performed   ? ?  ?  ? ?  ? ? ?Past Medical History:  ?Diagnosis Date  ? Benign prostatic hyperplasia with elevated prostate specific antigen (PSA)   ? Hypertension   ? Iliac artery stenosis, left (HCC)   ? with claudication resolved with stent  ? PAD (peripheral artery disease) (HCC)   ? Stroke [facility])   ? ?Past Surgical History:  ?Procedure Laterality Date  ? BUBBLE STUDY  08/31/2021  ? Procedure: BUBBLE STUDY;  Surgeon: Chilton Si, MD;  Location: [facility];  Service: Cardiovascular;;  ? ILIAC ARTERY STENT Left   ? IR ANGIO VERTEBRAL SEL SUBCLAVIAN INNOMINATE UNI L MOD SED  08/30/2021  ? IR ANGIO VERTEBRAL SEL SUBCLAVIAN INNOMINATE UNI R MOD SED  08/30/2021  ? IR CT HEAD LTD  08/30/2021  ? IR INTRA CRAN STENT  08/30/2021  ? IR RADIOLOGIST EVAL & MGMT  11/28/2021  ? IR RADIOLOGIST EVAL & MGMT  01/12/2022  ? IR US GUIDE VASC ACCESS RIGHT  08/30/2021  ? RADIOLOGY WITH ANESTHESIA N/A 08/30/2021  ? Procedure: IR WITH ANESTHESIA;  Surgeon: Julieanne Cotton, MD;  Location[facility];  Service: Radiology;  Laterality: N/A;  ? TEE WITHOUT CARDIOVERSION N/A 08/31/2021  ? Procedure: TRANSESOPHAGEAL ECHOCARDIOGRAM (TEE);  Surgeon: Chilton Si, MD;  Location: [facility];  Service: Cardiovascular;  Laterality: N/A;  ? ?Patient Active Problem List  ? Diagnosis Date Noted  ? Familial hypercholesteremia 11/06/2021  ? PFO (patent foramen ovale)  11/06/2021  ? Urinary retention 09/27/2021  ? Diplopia 09/27/2021  ? Carotid stenosis, right 09/27/2021  ? Anemia 09/27/2021  ? Slow transit constipation   ? Benign essential HTN   ? 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  ? Acute ischemic stroke (HCC)   ? Cerebral embolism with cerebral infarction 08/30/2021  ? 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 ? ?REFERRING DIAG: Cerebellar CVA ? ?THERAPY DIAG:  ?Unsteadiness on feet ? ?Other abnormalities of gait and mobility ? ?Ataxic gait ? ?Other lack of coordination ? ?Other symptoms and signs involving the nervous system ? ?PERTINENT HISTORY: Diplopia, basilar artery stenosis, PFO, cerebral infarction 08/2021, essential HTN ? ?PRECAUTIONS: Fall ? ?SUBJECTIVE: Doing well; feels like he needs to do more walking on non-therapy days.  Used the motorized cart at the grocery store and ran into a fruit display.   ? ?PAIN:  ?Are you having pain? No ?  ?TODAY'S TREATMENT: ? ?  01/22/22: ?Patient seen for aquatic therapy today.  Treatment took place in water 3-4 feet deep depending upon activity.  Pt entered and exited the pool via stairs, alternating sequence. ? ?Performed walking forwards and then backwards from shallow <> deep across length of pool without UE support but with PT providing light min guard for balance.  Performed 3 laps with 2nd and 3rd lap adding in head turns to L and R with forward walking only.  Decreased cues required today to maintain COG over BOS during retro gait and demonstrated more normal width BOS.  Transitioned to deep end and continued gait forwards across width of deep end without UE support or support of PT while performing horizontal head turns and vertical head nods.  Transitioned to holding aqua dumbbells in each UE in ABD with one dumbbell touching wall to R then touching wall to L while performing gait forwards with eyes closed x 4 laps total.  Required  intermittent tactile and verbal cues to right head and trunk forwards as pt continues to rotate to L when EC. ? ?Transitioned to supine with flotation belt and holding aqua dumbbells and performed supine <> "sidelying" suspended in water with head turn and pushing dumbbell down towards pool floor.  Performed x 5 reps to R side and then 5 reps to L side with cues to lead with pelvic rotation and to keep neck muscles relaxed (pt leading with head).  Increased difficulty rotating to L side. ? ?Returned to standing and performed alternating step ups forwards, step downs backwards with SLS at top of step with UE support on aqua dumbbells in front of patient.  Performed 6 reps each side with min-mod A from PT to maintain balance during SLS and cues to shift weight over stance LE prior to pushing up.  Performed 3 laps swimming prone breast stroke followed by supine freestyle transitioning between supine <> prone without touching feet down to pool floor.   ? ?Pt requires buoyancy of water for support for joint offloading, partial body weight support, to increase proprioception, to reduce fall risk with gait training and balance exercises with minimal UE support; exercises able to be performed safely in water without the risk of fall compared to those same exercises performed on land; viscosity of water needed for resistance for strengthening.  Current of water provides perturbations for challenging static & dynamic standing balance. ? ? ?01/17/22:Self-care/home management ?Lengthy discussion with pt and wife regarding results and interpretation of VNG. Pt verbalized frustration regarding not feeling as though his dizziness and balance are improving, provided emotional support and encouragement regarding pt's progress in therapy so far. All of wife's questions addressed and obtained second therapist's opinion regarding pursuing hearing aids, who also encouraged pt to pursue obtaining them.  ?   ?  Gait Training/NMR  ?Gait  pattern: step through pattern, decreased arm swing- Right, decreased step length- Right, decreased step length- Left, decreased stance time- Right, scissoring, ataxic, decreased trunk rotation, and poor foot clearance- Right ?Distance walked: 115' x3  ?Assistive device utilized: None ?Level of assistance: Min A and Mod A ?Comments: [address]' normal gait w/min A, noted asymmetrical step length of LLE and inconsistent step placement of RLE with occasional scissoring of gait resulting in posterolateral LOB to R side. Challenged pt to take more symmetrical steps by having him count aloud w/each step (up to 10) and attempting to maintain a consistent rhythm with his steps. Pt demonstrated significant difficulty with single leg stance on RLE and could not perform dual-task. Regressed to therapist counting aloud for pt and tasking pt to take a step with each number, which he was able to perform w/min-mod A. Noted that pt's BOS and stance phase time of RLE increased w/counting.  ? ?-Fwd step w/contralateral dot tap for BLE coordination, sequencing and single leg stability. Completed forward step  w/contralateral foot tapping dot during swing phase, 2x4 per side. Mod A throughout for posterolateral LOB to R side and max verbal cues for sequencing.  ? ? ?PATIENT EDUCATION: ?Education details: Transition to pool on a different day and therapist. ?Person educated: Patient, Spouse ?Education method: Explanation ?Education comprehension: verbalized understanding ?  ? PT Short Term Goals - 01/11/22 1017   ? ?  ? PT SHORT TERM GOAL #1  ? Title Improve Berg score from 38/56 to >/= 41/56 to decrease fall risk.   ? Baseline score 38/56 on 01-10-22   ? Time 4   ? Period Weeks   ? Status New   ? Target Date 02/08/22   ?  ? PT SHORT TERM GOAL #2  ? Title Improve TUG score from 21.84 secs with RW to </= 19 secs with RW with SBA only (no physical assist needed) to increase safety with ambulation.   ? Baseline 21.84 secs with RW with CGA to min  assist - ataxia in RLE   ? Time 4   ? Period Weeks   ? Status New   ? Target Date 02/08/22   ?  ? PT SHORT TERM GOAL #3  ? Title Pt will amb. 230' without device with CGA on flat, even surface for increased communit

## 2022-01-22 NOTE — Patient Instructions (Signed)
Scapular: Stabilization ? ? ? ?With palms resting comfortably on table, walk feet back, gently lean back to heels and then forward over hand keeping elbows straight. Hold 10 seconds. Relax. ?Repeat 10 times per set. 1x/day ? ?

## 2022-01-24 ENCOUNTER — Ambulatory Visit: Payer: 59 | Admitting: Occupational Therapy

## 2022-01-24 ENCOUNTER — Ambulatory Visit: Payer: 59 | Admitting: Physical Therapy

## 2022-01-24 ENCOUNTER — Encounter: Payer: Self-pay | Admitting: Occupational Therapy

## 2022-01-24 DIAGNOSIS — R29818 Other symptoms and signs involving the nervous system: Secondary | ICD-10-CM

## 2022-01-24 DIAGNOSIS — R208 Other disturbances of skin sensation: Secondary | ICD-10-CM

## 2022-01-24 DIAGNOSIS — R2681 Unsteadiness on feet: Secondary | ICD-10-CM

## 2022-01-24 DIAGNOSIS — R270 Ataxia, unspecified: Secondary | ICD-10-CM

## 2022-01-24 DIAGNOSIS — R278 Other lack of coordination: Secondary | ICD-10-CM | POA: Diagnosis not present

## 2022-01-24 DIAGNOSIS — R41842 Visuospatial deficit: Secondary | ICD-10-CM

## 2022-01-24 DIAGNOSIS — R2689 Other abnormalities of gait and mobility: Secondary | ICD-10-CM

## 2022-01-24 NOTE — Therapy (Signed)
[facility], [place], [address] ?Phone: [phone]   Fax:  [phone] ? ?Occupational Therapy Treatment ? ?Patient Details  ?Name: Maurice Little ?MRN: [id number] ?Date of Birth: 09-Feb-1956 ?Referring Provider (OT): Lauraine Rinne, PA-C ? ? ?Encounter Date: 01/24/2022 ? ? OT End of Session - 01/24/22 0939   ? ? Visit Number 31   ? Number of Visits 32   [phone]  ? Date for OT Re-Evaluation 01/26/22   ? Authorization Type UHC--no visit limit/no auth, copay per day   ? Authorization Time Period renewal completed 12/27/21   ? OT Start Time [id number]   ? OT Stop Time 1015   ? OT Time Calculation (min) 39 min   ? Activity Tolerance Patient tolerated treatment well   ? Behavior During Therapy [facility] for tasks assessed/performed   ? ?  ?  ? ?  ? ? ?Past Medical History:  ?Diagnosis Date  ? Benign prostatic hyperplasia with elevated prostate specific antigen (PSA)   ? Hypertension   ? Iliac artery stenosis, left (HCC)   ? with claudication resolved with stent  ? PAD (peripheral artery disease) ([place])   ? Stroke [facility])   ? ? ?Past Surgical History:  ?Procedure Laterality Date  ? BUBBLE STUDY  08/31/2021  ? Procedure: BUBBLE STUDY;  Surgeon: Skeet Latch, MD;  Location: [place];  Service: Cardiovascular;;  ? ILIAC ARTERY STENT Left   ? IR ANGIO VERTEBRAL SEL SUBCLAVIAN INNOMINATE UNI L MOD SED  08/30/2021  ? IR ANGIO VERTEBRAL SEL SUBCLAVIAN INNOMINATE UNI R MOD SED  08/30/2021  ? IR CT HEAD LTD  08/30/2021  ? IR INTRA CRAN STENT  08/30/2021  ? IR RADIOLOGIST EVAL & MGMT  11/28/2021  ? IR RADIOLOGIST EVAL & MGMT  01/12/2022  ? IR US GUIDE VASC ACCESS RIGHT  08/30/2021  ? RADIOLOGY WITH ANESTHESIA N/A 08/30/2021  ? Procedure: IR WITH ANESTHESIA;  Surgeon: Luanne Bras, MD;  Location: [place];  Service: Radiology;  Laterality: N/A;  ? TEE WITHOUT CARDIOVERSION N/A 08/31/2021  ? Procedure: TRANSESOPHAGEAL ECHOCARDIOGRAM (TEE);  Surgeon: Skeet Latch, MD;  Location: [place];  Service: Cardiovascular;  Laterality: N/A;  ? ? ?There were no vitals filed for this visit. ? ? Subjective Assessment - 01/24/22 0939   ? ? Subjective  "I wrote the alphabet and colored yesterday"   ? Pertinent History CVA (R cereballar and R small R brachium potis and R pontomedullary junction).   PMH:  dyslipidemia, HTN, carotid stenosis, anemia, PAD   ? Limitations ataxia, diplopia, folley catheter--d/c, decr hearing (particularly R side)   ? Patient Stated Goals incr independence, get better   ? Currently in Pain? No/denies   ? Pain Onset More than a month ago   ? ?  ?  ? ?  ? ? ?Picking up coins and stacking with R hand with min difficulty (stacks of 10) and then manipulation in hand to place in coin bank. ? ?Functional reaching in standing to place/remove 2lb object on overhead shelf with good control with RUE x12 with incr time.   ? ?Standing, in modified quadruped with forward/backward wt. Shifts x10 for incr shoulder stability.   ? ?Writing 2 sentences with built-up grip with approx 85-90% legibility and incr time.   ? ?Checked remaining goals and discussed progress-see below. ? ? ? ? ? ? ? OT Short Term Goals - 01/10/22 1137   ? ?  ? OT SHORT TERM GOAL #1  ?  Title Pt will be independent with initial HEP.--check STGs 11/02/21   ? Time 4   ? Period Weeks   ? Status Achieved   ?  ? OT SHORT TERM GOAL #2  ? Title Pt will perform BADLs mod I except supervision for shower transfer.   ? Time 4   ? Period Weeks   ? Status Achieved   min A for back, supervision for standing to pull up pants.  11/20/21:  supervision for ambulation for toileting (mod I for tolieting at night with [facility]), supervision for shower transfer.  12/04/21:  now ambulating to restroom mod I  ?  ? OT SHORT TERM GOAL #3  ? Title Pt will perform tabletop visual scanning with 90% accuracy and no reports of diplopia.   ? Time 4   ? Period Weeks   ? Status Achieved   ?  ? OT SHORT TERM GOAL #4  ? Title Pt will  verbalize understanding of visual compensation strategies for ADLs/IADLs prn (including ?taping, head turns/positioning)   ? Time 4   ? Period Weeks   ? Status Achieved   Pt/ wife verbalize understanding  ?  ? OT SHORT TERM GOAL #5  ? Title Pt will improve coordination for ADLs as shown by improving time on 9-hole peg test by at least 5sec bilaterally.   ? Baseline R-66.85sec, L-41.25sec   ? Time 4   ? Period Weeks   ? Status Achieved   R 65.25-not met yet improvedd, L 33.60- met partially met, met for LUE.  11/20/21:  R-51.75sec  ? ?  ?  ? ?  ? ? ? ? OT Long Term Goals - 01/24/22 0942   ? ?  ? OT LONG TERM GOAL #1  ? Title Pt will be independent with updated HEP.   ? Time 12   ? Period Weeks   ? Status Achieved   12/25/21:  not fully met/not performing consistently.  01/15/22:  met  ?  ? OT LONG TERM GOAL #2  ? Title Pt will perform simple snack prep/home maintenance task with supervision.--11/29/21 met initial goal, updated 11/29/21:  Pt will peform simple cooking and home maintenance tasks mod I.   ? Time 12   ? Period Weeks   ? Status Achieved   12/25/21:  ongoing, met initial goal, but has not met updated goal.  01/08/22:  snack prep mod I.  01/10/22:  simple cooking task supervision-mod I.  01/24/22 met  ?  ? OT LONG TERM GOAL #3  ? Title Pt will perform environmental scanning/navigation with at least 90% accuracy for incr safety in the home/community.   ? Time 12   ? Period Weeks   ? Status Achieved   12/18/21:  CGA with 93% accuracy in mod distracting environment.  01/15/22:  93% accuracy in mod distracting envionment mod I with RW  ?  ? OT LONG TERM GOAL #4  ? Title Pt will improve RUE coordination as shown by improving time on 9-hole peg test by at least 15sec.--met.  11/27/21 upgraded:  Pt will complete 9-hole peg test in 45sec or less with RUE.   ? Baseline 66.85sec   ? Time 12   ? Period Weeks   ? Status Not Met   12/25/21:  55.57sec.  01/22/22:  68.34sec.  01/24/22:  50.94  ?  ? OT LONG TERM GOAL #5  ? Title Pt will be  able to retrieve/replace 1-2 objects on overhead shelf with each RUE x10  safetly and with good control for IADL tasks without pain.   ? Time 12   ? Period Weeks   ? Status Achieved   12/25/21:  can place lightweight objects <1lb, but needs cueing/facilitation for positioning and reports 1-2/10 R shoulder pain, able to perform with LUE (revised 12/25/21).  01/24/22:  met with 2lbs wt  ?  ? OT LONG TERM GOAL #6  ? Title --   ?  ? OT LONG TERM GOAL #7  ? Title Pt will be able to write at least 4 sentences with 100% legibility in reasonable amount of time.   ? Time 12   ? Period Weeks   ? Status Not Met   11/29/21:  approx 90% legibility.  12/25/21:  approx 75% legibility.  01/24/22:  fluctuates, approx 85%-90% today  ? ?  ?  ? ?  ? ? ? ? ? ? ? ? Plan - 01/24/22 0942   ? ? Clinical Impression Statement Pt has made good progress towards goals, ataxia and decr balance continue to be limiting factors.  Pt is continuing with HEP and will continue with PT for balance/gait.   ? OT Occupational Profile and History Detailed Assessment- Review of Records and additional review of physical, cognitive, psychosocial history related to current functional performance   ? Occupational performance deficits (Please refer to evaluation for details): ADL's;IADL's;Work;Leisure   ? Body Structure / Function / Physical Skills ADL;UE functional use;Balance;FMC;Coordination;Sensation;IADL;Decreased knowledge of use of DME;Dexterity;Vision   ? Rehab Potential Good   ? Clinical Decision Making Several treatment options, min-mod task modification necessary   ? Comorbidities Affecting Occupational Performance: May have comorbidities impacting occupational performance   ? Modification or Assistance to Complete Evaluation  Min-Moderate modification of tasks or assist with assess necessary to complete eval   ? OT Frequency 2x / week   ? OT Duration 12 weeks   +eval  ? OT Treatment/Interventions Self-care/ADL training;Energy  conservation;Ultrasound;Visual/perceptual remediation/compensation;Patient/family education;DME and/or AE instruction;Aquatic Therapy;Paraffin;Passive range of motion;Balance training;Cryotherapy;Functional Mobility Training;Moist Heat;Therapeut

## 2022-01-24 NOTE — Therapy (Signed)
?OUTPATIENT PHYSICAL THERAPY TREATMENT NOTE ? ? ?Patient Name: Maurice Little ?MRN: [id number] ?DOB:01/25/1956, 66 y.o., male ?Today's Date: 01/24/2022 ? ?PCP: de [place], Raymond J, MD ?REFERRING PROVIDER: Cathlyn Parsons, PA-C ? ? PT End of Session - 01/24/22 1020   ? ? Visit Number 27   ? Number of Visits 39   ? Date for PT Re-Evaluation 03/08/22   ? Authorization Type UHC ([place] with no auth required)   ? PT Start Time 1018   ? PT Stop Time 1104   ? PT Time Calculation (min) 46 min   ? Equipment Utilized During Treatment Gait belt   ? Activity Tolerance Patient tolerated treatment well   ? Behavior During Therapy [facility] for tasks assessed/performed   ? ?  ?  ? ?  ? ? ?Past Medical History:  ?Diagnosis Date  ? Benign prostatic hyperplasia with elevated prostate specific antigen (PSA)   ? Hypertension   ? Iliac artery stenosis, left (HCC)   ? with claudication resolved with stent  ? PAD (peripheral artery disease) ([place])   ? Stroke [facility])   ? ?Past Surgical History:  ?Procedure Laterality Date  ? BUBBLE STUDY  08/31/2021  ? Procedure: BUBBLE STUDY;  Surgeon: Skeet Latch, MD;  Location: [place];  Service: Cardiovascular;;  ? ILIAC ARTERY STENT Left   ? IR ANGIO VERTEBRAL SEL SUBCLAVIAN INNOMINATE UNI L MOD SED  08/30/2021  ? IR ANGIO VERTEBRAL SEL SUBCLAVIAN INNOMINATE UNI R MOD SED  08/30/2021  ? IR CT HEAD LTD  08/30/2021  ? IR INTRA CRAN STENT  08/30/2021  ? IR RADIOLOGIST EVAL & MGMT  11/28/2021  ? IR RADIOLOGIST EVAL & MGMT  01/12/2022  ? IR US GUIDE VASC ACCESS RIGHT  08/30/2021  ? RADIOLOGY WITH ANESTHESIA N/A 08/30/2021  ? Procedure: IR WITH ANESTHESIA;  Surgeon: Luanne Bras, MD;  Location: [place];  Service: Radiology;  Laterality: N/A;  ? TEE WITHOUT CARDIOVERSION N/A 08/31/2021  ? Procedure: TRANSESOPHAGEAL ECHOCARDIOGRAM (TEE);  Surgeon: Skeet Latch, MD;  Location: [place];  Service: Cardiovascular;  Laterality: N/A;  ? ?Patient Active Problem List  ? Diagnosis Date Noted  ? Familial  hypercholesteremia 11/06/2021  ? PFO (patent foramen ovale) 11/06/2021  ? Urinary retention 09/27/2021  ? Diplopia 09/27/2021  ? Carotid stenosis, right 09/27/2021  ? Anemia 09/27/2021  ? Slow transit constipation   ? Benign essential HTN   ? Cerebellar stroke ([place]) 09/04/2021  ? Acute ischemic stroke ([place])   ? Cerebral embolism with cerebral infarction 08/30/2021  ? 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 ? ?REFERRING DIAG: Cerebellar CVA ? ?THERAPY DIAG:  ?Unsteadiness on feet ? ?Other abnormalities of gait and mobility ? ?Other symptoms and signs involving the nervous system ? ?PERTINENT HISTORY: Diplopia, basilar artery stenosis, PFO, cerebral infarction 08/2021, essential HTN ? ?PRECAUTIONS: Fall ? ?SUBJECTIVE: Doing well, has not ran into any more fruit displays. Pt reports he broke his TV and is excited to get a new one Saturday.  ? ?PAIN:  ?Are you having pain? No ?  ?TODAY'S TREATMENT: ? ?  TODAY'S SESSION  ?  NMR  ?Modified turkish sit <>stands w/5# DB for immediate standing balance, dual-tasking and BLE strengthening. Pt performed 5 reps per side with short seated rest break in between sets, CGA-min guard throughout for steadying assist and one minor anterior LOB noted when DB in LUE  ? ?At rebounder with 1KG ball, standing ball throw  and catches for reactive and anticipatory catching, visual tracking and dynamic standing balance. Pt required min-mod A in beginning due to RUE ataxia and slamming ball into chest with each catch, resulting in posterior LOB. Progressed to staggered stance and 8 throws per leg, min guard throughout.   ? ?Ladder drills with 2 therapists for safety. Fwd 2 feet per square w/min-mod A x2, down and back ladder x2 leading with RLE and LLE. Noted ataxia of RLE but equal stance time on BLEs throughout. Progressed to step-through pattern (one foot per sqare) w/HHA x2, which was too easy. Progressed to  step-through pattern w/min-mod A x2 and pt demonstrated significant lean to L side but good foot placement. Pt completed ladder x2 down and back.  ? ?PATIENT EDUCATION: ?Education details: Introduction to new pool therapist  ?Person educated: Patient ?Education method: Explanation ?Education comprehension: verbalized understanding ?  ? PT Short Term Goals - 01/11/22 1017   ? ?  ? PT SHORT TERM GOAL #1  ? Title Improve Berg score from 38/56 to >/= 41/56 to decrease fall risk.   ? Baseline score 38/56 on 01-10-22   ? Time 4   ? Period Weeks   ? Status New   ? Target Date 02/08/22   ?  ? PT SHORT TERM GOAL #2  ? Title Improve TUG score from 21.84 secs with RW to </= 19 secs with RW with SBA only (no physical assist needed) to increase safety with ambulation.   ? Baseline 21.84 secs with RW with CGA to min assist - ataxia in RLE   ? Time 4   ? Period Weeks   ? Status New   ? Target Date 02/08/22   ?  ? PT SHORT TERM GOAL #3  ? Title Pt will amb. 230' without device with CGA on flat, even surface for increased community accessibility.   ? Baseline --   ? Time 4   ? Period Weeks   ? Status New   ? Target Date 02/08/22   ?  ? PT SHORT TERM GOAL #4  ? Title Pt will negotiate 4 steps with use of bil. hand rails with SBA   ? Baseline --   ? Time 4   ? Period Weeks   ? Status New   ? Target Date 02/08/22   ?  ? PT SHORT TERM GOAL #5  ? Title Independent in updated HEP for balance exercises and pt will report performing HEP on regular basis.   ? Baseline --   ? Time 4   ? Period Weeks   ? Status New   ? Target Date 02/08/22   ?  ? PT SHORT TERM GOAL #6  ? Title --   ? Baseline --   ? Time --   ? Period --   ? Status --   ? Target Date --   ? ?  ?  ? ?  ? ? PT Long Term Goals - 01/10/22 1158   ? ?  ? PT LONG TERM GOAL #1  ? Title Pt will be modified independent with household amb. without use of assistive device.   ? Baseline Pt reports he is walking in the house with the RW independently   ? Time 8   ? Period Weeks   ? Status  On-going   ? Target Date 03/08/22   ?  ? PT LONG TERM GOAL #2  ? Title Pt will amb. without device with CGA 500' for increased  community accessibility.   ? Time 8   ? Period Weeks   ? Status Not Met   ? Target Date 03/08/22   ?  ? PT LONG TERM GOAL #3  ? Title Improve TUG score to </= 20 secs with appropriate assistive device to demo improved functional mobility and to reduce fall risk.   ? Baseline 1".07 secs with RW - 10-16-21;  TUG with RW- 22.97 secs,   21.84 secs without RW - needs CGA for safety   ? Time 8   ? Period Weeks   ? Status New   ? Target Date 03/08/22   ?  ? PT LONG TERM GOAL #4  ? Title Increase Berg score to >/= 45/56 to reduce fall risk.   ? Baseline 27/56;     01-10-22 = 38/56   ? Time 8   ? Period Weeks   ? Status On-going   ? Target Date 03/08/22   ?  ? PT LONG TERM GOAL #5  ? Title Increase gait velocity from .43 ft/sec to >/= 2.0 ft/sec with RW for increased gait efficiency.   ? Baseline 17.59 secs = 1.86 ft/sec with RW   ? Time 8   ? Period Weeks   ? Status New   ? Target Date 03/08/22   ?  ? Additional Long Term Goals  ? Additional Long Term Goals Yes   ?  ? PT LONG TERM GOAL #6  ? Title Independent in updated HEP as appropriate.   ? Baseline met 01-10-22   ? Time 12   ? Period Weeks   ? Status Achieved   ? Target Date 01/11/22   ?  ? PT LONG TERM GOAL #7  ? Title Improve TUG score to </= 17 secs without RW with SBA for increased safety with gait.   ? Baseline 21.84 secs without RW with min assist   ? Time 8   ? Period Weeks   ? Status New   ? Target Date 03/08/22   ? ?  ?  ? ?  ? ? ? Plan - 01/11/22 1045   ? ? Clinical Impression Statement Emphasis of skilled PT session on dynamic balance and UE/LE coordination. Pt reports high levels of fatigue with cognitive tasks but is improving with step placement and length of RLE. Continue POC.   ? Personal Factors and Comorbidities Comorbidity 2;Profession   ? Comorbidities basilar artery stenosis, dyslipidemia,  HTN, neurogenic bladder, diplopia,  anemia, Lt iliac artery stenosis, PAD   ? Examination-Activity Limitations Bathing;Carry;Continence;Toileting;Dressing;Lift;Stand;Stairs;Squat;Reach Overhead;Locomotion Level;Transfers   ? Examination-Participatio

## 2022-01-25 ENCOUNTER — Other Ambulatory Visit: Payer: 59 | Admitting: *Deleted

## 2022-01-25 DIAGNOSIS — E7801 Familial hypercholesterolemia: Secondary | ICD-10-CM

## 2022-01-25 LAB — LIPID PANEL
Chol/HDL Ratio: 2.1 ratio (ref 0.0–5.0)
Cholesterol, Total: 116 mg/dL (ref 100–199)
HDL: 55 mg/dL (ref >39)
LDL Chol Calc (NIH): 48 mg/dL (ref 0–99)
Triglycerides: 56 mg/dL (ref 0–149)
VLDL Cholesterol Cal: 13 mg/dL (ref 5–40)

## 2022-01-29 ENCOUNTER — Ambulatory Visit: Payer: 59 | Attending: Family Medicine | Admitting: Physical Therapy

## 2022-01-29 DIAGNOSIS — R29818 Other symptoms and signs involving the nervous system: Secondary | ICD-10-CM | POA: Diagnosis present

## 2022-01-29 DIAGNOSIS — R278 Other lack of coordination: Secondary | ICD-10-CM | POA: Insufficient documentation

## 2022-01-29 DIAGNOSIS — R2689 Other abnormalities of gait and mobility: Secondary | ICD-10-CM | POA: Insufficient documentation

## 2022-01-29 DIAGNOSIS — R2681 Unsteadiness on feet: Secondary | ICD-10-CM | POA: Diagnosis present

## 2022-01-29 NOTE — Therapy (Signed)
?OUTPATIENT PHYSICAL THERAPY TREATMENT NOTE ? ? ?Patient Name: Maurice Little ?MRN: [id number] ?DOB:1955-12-06, 66 y.o., male ?Today's Date: 01/29/2022 ? ?PCP: de [place], Raymond J, MD ?REFERRING PROVIDER: Cathlyn Parsons, PA-C ? ? PT End of Session - 01/29/22 1322   ? ? Visit Number 28   ? Number of Visits 39   ? Date for PT Re-Evaluation 03/08/22   ? Authorization Type UHC ([place] with no auth required)   ? PT Start Time 1319   ? PT Stop Time 1402   ? PT Time Calculation (min) 43 min   ? Equipment Utilized During Treatment Gait belt   ? Activity Tolerance Patient tolerated treatment well   ? Behavior During Therapy [facility] for tasks assessed/performed   ? ?  ?  ? ?  ? ? ?Past Medical History:  ?Diagnosis Date  ? Benign prostatic hyperplasia with elevated prostate specific antigen (PSA)   ? Hypertension   ? Iliac artery stenosis, left (HCC)   ? with claudication resolved with stent  ? PAD (peripheral artery disease) ([place])   ? Stroke [facility])   ? ?Past Surgical History:  ?Procedure Laterality Date  ? BUBBLE STUDY  08/31/2021  ? Procedure: BUBBLE STUDY;  Surgeon: Skeet Latch, MD;  Location: [place];  Service: Cardiovascular;;  ? ILIAC ARTERY STENT Left   ? IR ANGIO VERTEBRAL SEL SUBCLAVIAN INNOMINATE UNI L MOD SED  08/30/2021  ? IR ANGIO VERTEBRAL SEL SUBCLAVIAN INNOMINATE UNI R MOD SED  08/30/2021  ? IR CT HEAD LTD  08/30/2021  ? IR INTRA CRAN STENT  08/30/2021  ? IR RADIOLOGIST EVAL & MGMT  11/28/2021  ? IR RADIOLOGIST EVAL & MGMT  01/12/2022  ? IR US GUIDE VASC ACCESS RIGHT  08/30/2021  ? RADIOLOGY WITH ANESTHESIA N/A 08/30/2021  ? Procedure: IR WITH ANESTHESIA;  Surgeon: Luanne Bras, MD;  Location: [place];  Service: Radiology;  Laterality: N/A;  ? TEE WITHOUT CARDIOVERSION N/A 08/31/2021  ? Procedure: TRANSESOPHAGEAL ECHOCARDIOGRAM (TEE);  Surgeon: Skeet Latch, MD;  Location: [place];  Service: Cardiovascular;  Laterality: N/A;  ? ?Patient Active Problem List  ? Diagnosis Date Noted  ? Familial  hypercholesteremia 11/06/2021  ? PFO (patent foramen ovale) 11/06/2021  ? Urinary retention 09/27/2021  ? Diplopia 09/27/2021  ? Carotid stenosis, right 09/27/2021  ? Anemia 09/27/2021  ? Slow transit constipation   ? Benign essential HTN   ? Cerebellar stroke ([place]) 09/04/2021  ? Acute ischemic stroke ([place])   ? Cerebral embolism with cerebral infarction 08/30/2021  ? 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 ? ?REFERRING DIAG: Cerebellar CVA ? ?THERAPY DIAG:  ?Unsteadiness on feet ? ?Other abnormalities of gait and mobility ? ?Other symptoms and signs involving the nervous system ? ?PERTINENT HISTORY: Diplopia, basilar artery stenosis, PFO, cerebral infarction 08/2021, essential HTN ? ?PRECAUTIONS: Fall ? ?SUBJECTIVE: Pt reports he has a mild cough, has been taking cough drops to suppress it. Took COVID test and it was negative. Feels "off" today, [name] present for session.  ? ?PAIN:  ?Are you having pain? Yes: NPRS scale: 7/10 ?Pain location: Bilat anterior tibialis and hip flexors ?Pain description: burning and throbbing  ?Aggravating factors: Walking ?Relieving factors: Rest ?  ?TODAY'S TREATMENT: ? ?  TODAY'S SESSION  ?  Gait Training  ?Gait pattern: step to pattern, step through pattern, decreased arm swing- Right, decreased arm swing- Left, decreased step length- Right, decreased step length- Left, decreased stance  time- Right, Right steppage, Left steppage, Right foot flat, ataxic, and poor foot clearance- Right ?Distance walked: 230' ?Assistive device utilized: None ?Level of assistance: Min A and Mod A ?Comments: Pt demonstrated increased ataxia of R side today, frequently exhibiting crossover steps of RLE requiring mod-max A to prevent fall. Pt very rigid in sagittal plane despite verbal cues. Attempted to have pt step over random 6" hurdles throughout gym, but pt unable to perform without max A due to poor placement of RLE.   ? ?NMR  ?Lengthy discussion regarding proper exercises for pt to be performing at home, being more active throughout day and addressing gait impairments. Pt performed sit <>quadruped on mat to perform bird dogs, x5 per side w/min A for stabilization on R side when reaching w/LLE. Added exercise to HEP and plan to provide more structured program for pt to begin at home.  ? ? ? ?PATIENT EDUCATION: ?Education details: Gait impairments, importance of exercise at home, use of recumbent bike at home.  ?Person educated: Patient and Wife [name])  ?Education method: Explanation and handout  ?Education comprehension: verbalized understanding and needs further education  ? ? ?HEP  ?Access Code: [email or web address] ?URL: [email or web address] ?Date: 01/29/2022 ?Prepared by: Mickie Bail Zeriah Baysinger ? ?Exercises ?- Bird Dog  - 1 x daily - 7 x weekly - 3 sets - 10 reps ? ? ?GOALS ?  ? PT Short Term Goals - 01/11/22 1017   ? ?  ? PT SHORT TERM GOAL #1  ? Title Improve Berg score from 38/56 to >/= 41/56 to decrease fall risk.   ? Baseline score 38/56 on 01-10-22   ? Time 4   ? Period Weeks   ? Status New   ? Target Date 02/08/22   ?  ? PT SHORT TERM GOAL #2  ? Title Improve TUG score from 21.84 secs with RW to </= 19 secs with RW with SBA only (no physical assist needed) to increase safety with ambulation.   ? Baseline 21.84 secs with RW with CGA to min assist - ataxia in RLE   ? Time 4   ? Period Weeks   ? Status New   ? Target Date 02/08/22   ?  ? PT SHORT TERM GOAL #3  ? Title Pt will amb. 230' without device with CGA on flat, even surface for increased community accessibility.   ? Baseline --   ? Time 4   ? Period Weeks   ? Status New   ? Target Date 02/08/22   ?  ? PT SHORT TERM GOAL #4  ? Title Pt will negotiate 4 steps with use of bil. hand rails with SBA   ? Baseline --   ? Time 4   ? Period Weeks   ? Status New   ? Target Date 02/08/22   ?  ? PT SHORT TERM GOAL #5  ? Title Independent in updated HEP for balance exercises and pt  will report performing HEP on regular basis.   ? Baseline --   ? Time 4   ? Period Weeks   ? Status New   ? Target Date 02/08/22   ?  ? PT SHORT TERM GOAL #6  ? Title --   ? Baseline --   ? Time --   ? Period --   ? Status --   ? Target Date --   ? ?  ?  ? ?  ? ? PT Long Term Goals - 01/10/22 1158   ? ?  ?  PT LONG TERM GOAL #1  ? Title Pt will be modified independent with household amb. without use of assistive device.   ? Baseline Pt reports he is walking in the house with the RW independently   ? Time 8   ? Period Weeks   ? Status On-going   ? Target Date 03/08/22   ?  ? PT LONG TERM GOAL #2  ? Title Pt will amb. without device with CGA 500' for increased community accessibility.   ? Time 8   ? Period Weeks   ? Status Not Met   ? Target Date 03/08/22   ?  ? PT LONG TERM GOAL #3  ? Title Improve TUG score to </= 20 secs with appropriate assistive device to demo improved functional mobility and to reduce fall risk.   ? Baseline 1".07 secs with RW - 10-16-21;  TUG with RW- 22.97 secs,   21.84 secs without RW - needs CGA for safety   ? Time 8   ? Period Weeks   ? Status New   ? Target Date 03/08/22   ?  ? PT LONG TERM GOAL #4  ? Title Increase Berg score to >/= 45/56 to reduce fall risk.   ? Baseline 27/56;     01-10-22 = 38/56   ? Time 8   ? Period Weeks   ? Status On-going   ? Target Date 03/08/22   ?  ? PT LONG TERM GOAL #5  ? Title Increase gait velocity from .43 ft/sec to >/= 2.0 ft/sec with RW for increased gait efficiency.   ? Baseline 17.59 secs = 1.86 ft/sec with RW   ? Time 8   ? Period Weeks   ? Status New   ? Target Date 03/08/22   ?  ? Additional Long Term Goals  ? Additional Long Term Goals Yes   ?  ? PT LONG TERM GOAL #6  ? Title Independent in updated HEP as appropriate.   ? Baseline met 01-10-22   ? Time 12   ? Period Weeks   ? Status Achieved   ? Target Date 01/11/22   ?  ? PT LONG TERM GOAL #7  ? Title Improve TUG score to </= 17 secs without RW with SBA for increased safety with gait.   ? Baseline  21.84 secs without RW with min assist   ? Time 8   ? Period Weeks   ? Status New   ? Target Date 03/08/22   ? ?  ?  ? ?  ? ?ASSESSMENT  ? Plan - 01/11/22 1045   ? ? Clinical Impression Statement Emphasis of skilled P

## 2022-01-31 ENCOUNTER — Telehealth (INDEPENDENT_AMBULATORY_CARE_PROVIDER_SITE_OTHER): Payer: 59 | Admitting: Family Medicine

## 2022-01-31 ENCOUNTER — Ambulatory Visit: Payer: 59 | Admitting: Physical Therapy

## 2022-01-31 DIAGNOSIS — R29818 Other symptoms and signs involving the nervous system: Secondary | ICD-10-CM

## 2022-01-31 DIAGNOSIS — R2681 Unsteadiness on feet: Secondary | ICD-10-CM

## 2022-01-31 DIAGNOSIS — R051 Acute cough: Secondary | ICD-10-CM

## 2022-01-31 DIAGNOSIS — R059 Cough, unspecified: Secondary | ICD-10-CM | POA: Insufficient documentation

## 2022-01-31 DIAGNOSIS — R2689 Other abnormalities of gait and mobility: Secondary | ICD-10-CM

## 2022-01-31 MED ORDER — BENZONATATE 200 MG PO CAPS: 200.0000 mg | ORAL_CAPSULE | Freq: Three times a day (TID) | ORAL | 0 refills | Status: DC | PRN

## 2022-01-31 NOTE — Assessment & Plan Note (Signed)
Maurice Little is a 66 year old male presenting for evaluation of ongoing cough, URI symptoms.  His wife is also accompanying him on the virtual visit today.  Patient has been having symptoms for about 3 days.  His primary concern is related to cough which tends to be more noticeable at night.  He has felt somewhat fatigued, had sinus congestion, runny nose, cough.  They did do an at-home COVID test yesterday which was negative.  He has not had any fevers, no significant shortness of breath, no impact to his ADLs.  They have questions about what medications can be utilized given his history with recent stroke as well as blood pressure concerns. ?During visit, patient appropriately interactive, does have residual deficits from prior stroke, however does not exhibit any dyspnea, use of accessory muscles for breathing, able to speak in complete sentences. ?Discussed with patient that symptoms are likely due to viral etiology and thus symptomatic management is appropriate ?Given his history, utilizing over-the-counter medication which would have decreased potential risk on affecting his blood pressure is advisable, due to this recommend Coricidin as opposed to other medications which contain pseudoephedrine or phenylephrine. ?Can also utilize honey either on its own or mixed with tea to help with soothing the throat and suppressing cough.  Will also send in prescription for Jerilynn Som for patient to utilize ?Recommend ensuring adequate hydration and rest ?Discussed expectation that symptoms should generally be improving within 5 to 7 days.  It is possible that cough can linger for up to 4 to 6 weeks after acute viral illness.  Discussed that if symptoms persist longer than expected timeframe or if cough persist beyond 6 weeks, to return to office for further evaluation ?

## 2022-01-31 NOTE — Progress Notes (Signed)
? ?Virtual Visit via Telephone ?  ?I connected with  Maurice Little  on 01/31/22 by telephone/telehealth and verified that I am speaking with the correct person using two identifiers. ?  ?I discussed the limitations, risks, security and privacy concerns of performing an evaluation and management service by telephone, including the higher likelihood of inaccurate diagnosis and treatment, and the availability of in person appointments.  We also discussed the likely need of an additional face to face encounter for complete and high quality delivery of care.  I also discussed with the patient that there may be a patient responsible charge related to this service. The patient expressed understanding and wishes to proceed. ? ?Provider location is in medical facility. ?Patient location is at their home, different from provider location. ?People involved in care of the patient during this telehealth encounter were myself, my nurse/medical assistant, and my front office/scheduling team member. ? ?Review of Systems: No fevers, chills, night sweats, weight loss, chest pain, or shortness of breath.  ? ?Objective Findings:   ? ?General: Speaking full sentences, no audible heavy breathing.  Sounds alert and appropriately interactive.   ? ?Independent interpretation of tests performed by another provider:  ? ?None. ? ?Brief History, Exam, Impression, and Recommendations:   ? ?Cough ?Maurice Little is a 66 year old male presenting for evaluation of ongoing cough, URI symptoms.  His wife is also accompanying him on the virtual visit today.  Patient has been having symptoms for about 3 days.  His primary concern is related to cough which tends to be more noticeable at night.  He has felt somewhat fatigued, had sinus congestion, runny nose, cough.  They did do an at-home COVID test yesterday which was negative.  He has not had any fevers, no significant shortness of breath, no impact to his ADLs.  They have questions about what medications can be  utilized given his history with recent stroke as well as blood pressure concerns. ?During visit, patient appropriately interactive, does have residual deficits from prior stroke, however does not exhibit any dyspnea, use of accessory muscles for breathing, able to speak in complete sentences. ?Discussed with patient that symptoms are likely due to viral etiology and thus symptomatic management is appropriate ?Given his history, utilizing over-the-counter medication which would have decreased potential risk on affecting his blood pressure is advisable, due to this recommend Coricidin as opposed to other medications which contain pseudoephedrine or phenylephrine. ?Can also utilize honey either on its own or mixed with tea to help with soothing the throat and suppressing cough.  Will also send in prescription for Ladona Ridgel for patient to utilize ?Recommend ensuring adequate hydration and rest ?Discussed expectation that symptoms should generally be improving within 5 to 7 days.  It is possible that cough can linger for up to 4 to 6 weeks after acute viral illness.  Discussed that if symptoms persist longer than expected timeframe or if cough persist beyond 6 weeks, to return to office for further evaluation ? ?I discussed the above assessment and treatment plan with the patient. The patient was provided an opportunity to ask questions and all were answered. The patient agreed with the plan and demonstrated an understanding of the instructions. ?  ?The patient was advised to call back or seek an in-person evaluation if the symptoms worsen or if the condition fails to improve as anticipated. ?  ?I provided 15 minutes of face to face and non-face-to-face time during this encounter date, time was needed to gather information, review chart,  records, communicate/coordinate with staff remotely, as well as complete documentation. ? ? ?___________________________________________ ?Anasophia Pecor [place], MD, ABFM, CAQSM ?Primary  Care and Sports Medicine ?[place] ?

## 2022-01-31 NOTE — Therapy (Signed)
?OUTPATIENT PHYSICAL THERAPY TREATMENT NOTE ? ? ?Patient Name: Maurice Little ?MRN: [id number] ?DOB:21-Oct-1956, 66 y.o., male ?Today's Date: 01/31/2022 ? ?PCP: [place], Raymond J, MD ?REFERRING PROVIDER: Cathlyn Parsons, PA-C ? ? PT End of Session - 01/31/22 1108   ? ? Visit Number 29   ? Number of Visits 39   ? Date for PT Re-Evaluation 03/08/22   ? Authorization Type [employer]HC ([place] with no auth required)   ? Progress Note Due on Visit --   ? PT Start Time 1104   ? PT Stop Time 1148   ? PT Time Calculation (min) 44 min   ? Equipment Utilized During Treatment Gait belt   ? Activity Tolerance Patient tolerated treatment well   ? Behavior During Therapy [facility] for tasks assessed/performed   ? ?  ?  ? ?  ? ? ? ?Past Medical History:  ?Diagnosis Date  ? Benign prostatic hyperplasia with elevated prostate specific antigen (PSA)   ? Hypertension   ? Iliac artery stenosis, left (HCC)   ? with claudication resolved with stent  ? PAD (peripheral artery disease) ([place])   ? Stroke [facility])   ? ?Past Surgical History:  ?Procedure Laterality Date  ? BUBBLE STUDY  08/31/2021  ? Procedure: BUBBLE STUDY;  Surgeon: Skeet Latch, MD;  Location: [place];  Service: Cardiovascular;;  ? ILIAC ARTERY STENT Left   ? IR ANGIO VERTEBRAL SEL SUBCLAVIAN INNOMINATE UNI L MOD SED  08/30/2021  ? IR ANGIO VERTEBRAL SEL SUBCLAVIAN INNOMINATE UNI R MOD SED  08/30/2021  ? IR CT HEAD LTD  08/30/2021  ? IR INTRA CRAN STENT  08/30/2021  ? IR RADIOLOGIST EVAL & MGMT  11/28/2021  ? IR RADIOLOGIST EVAL & MGMT  01/12/2022  ? IR US GUIDE VASC ACCESS RIGHT  08/30/2021  ? RADIOLOGY WITH ANESTHESIA N/A 08/30/2021  ? Procedure: IR WITH ANESTHESIA;  Surgeon: Luanne Bras, MD;  Location: [place];  Service: Radiology;  Laterality: N/A;  ? TEE WITHOUT CARDIOVERSION N/A 08/31/2021  ? Procedure: TRANSESOPHAGEAL ECHOCARDIOGRAM (TEE);  Surgeon: Skeet Latch, MD;  Location: [place];  Service: Cardiovascular;  Laterality: N/A;  ? ?Patient Active Problem List  ?  Diagnosis Date Noted  ? Cough 01/31/2022  ? Familial hypercholesteremia 11/06/2021  ? PFO (patent foramen ovale) 11/06/2021  ? Urinary retention 09/27/2021  ? Diplopia 09/27/2021  ? Carotid stenosis, right 09/27/2021  ? Anemia 09/27/2021  ? Slow transit constipation   ? Benign essential HTN   ? 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 Acute ischemic stroke ([place])   ? Cerebral embolism with cerebral infarction 08/30/2021  ? 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 ? ?REFERRING DIAG: Cerebellar CVA ? ?THERAPY DIAG:  ?Unsteadiness on feet ? ?Other abnormalities of gait and mobility ? ?Other symptoms and signs involving the nervous system ? ?PERTINENT HISTORY: Diplopia, basilar artery stenosis, PFO, cerebral infarction 08/2021, essential HTN ? ?PRECAUTIONS: Fall ? ?SUBJECTIVE: Pt reports he did not try the exercises at home due to being sick. Has audiology appointment today for R hearing aid. No new changes.  ? ?PAIN:  ?Are you having pain? No ?  ?TODAY'S TREATMENT: ? ?  TODAY'S SESSION  ?  Ther Ex ?NuStep level 8 for 6 minutes w/BUE/BLEs fo dynamic cardiovascular warmup, UE/LE coordination and priming for reciprocal movement  ? ?Gait pattern: step to pattern, step through pattern, decreased arm swing- Right, decreased arm swing- Left, decreased step length- Right,  decreased step length- Left, decreased stance time- Right, Right steppage, Left steppage, Right foot flat, ataxic, and poor foot clearance- Right ?Distance walked: various clinic distances  ?Assistive device utilized: Noneand RW ?Level of assistance: Min A and Mod A ?Comments: Pt extremely fatigued following NuStep and required mod A to walk 20' from bike to mat table. Noted several LOB episodes to L side due to poor R foot placement and very slow gait speed w/truncal rigidity.  ? ?NMR  ?Pt performed floor transfer w/min A and cues for proper sequencing. Attempted to perform tall  kneel to half kneel while stabilizing trekking pole in RUE for improved lateral weight shifting, truncal stabilization and BLE coordination. Pt unable to attempt without holding onto mat table w/LUE, likely due to fear-avoidance behavior. Pt performed 2 reps on each side prior to requesting to stop due to fatigue and cramping of hamstring. Min A to perform floor to mat transfer.  ? ? ?PATIENT EDUCATION: ?Education details: Drinking more water throughout day, performing HS stretch, increasing mobility throughout day for improved carryover and functional gain from therapy.  ?Person educated: Patient and Wife [name])  ?Education method: Explanation and handout  ?Education comprehension: verbalized understanding and needs further education  ? ? ?HEP  ?Access Code: [email or web address] ?URL: [email or web address] ?Date: 01/29/2022 ?Prepared by: Mickie Bail Sparsh Callens ? ?Exercises ?- Bird Dog  - 1 x daily - 7 x weekly - 3 sets - 10 reps ? ? ?GOALS ?  ? PT Short Term Goals - 01/11/22 1017   ? ?  ? PT SHORT TERM GOAL #1  ? Title Improve Berg score from 38/56 to >/= 41/56 to decrease fall risk.   ? Baseline score 38/56 on 01-10-22   ? Time 4   ? Period Weeks   ? Status New   ? Target Date 02/08/22   ?  ? PT SHORT TERM GOAL #2  ? Title Improve TUG score from 21.84 secs with RW to </= 19 secs with RW with SBA only (no physical assist needed) to increase safety with ambulation.   ? Baseline 21.84 secs with RW with CGA to min assist - ataxia in RLE   ? Time 4   ? Period Weeks   ? Status New   ? Target Date 02/08/22   ?  ? PT SHORT TERM GOAL #3  ? Title Pt will amb. 230' without device with CGA on flat, even surface for increased community accessibility.   ? Baseline --   ? Time 4   ? Period Weeks   ? Status New   ? Target Date 02/08/22   ?  ? PT SHORT TERM GOAL #4  ? Title Pt will negotiate 4 steps with use of bil. hand rails with SBA   ? Baseline --   ? Time 4   ? Period Weeks   ? Status New   ? Target Date 02/08/22   ?  ? PT SHORT  TERM GOAL #5  ? Title Independent in updated HEP for balance exercises and pt will report performing HEP on regular basis.   ? Baseline --   ? Time 4   ? Period Weeks   ? Status New   ? Target Date 02/08/22   ?  ? PT SHORT TERM GOAL #6  ? Title --   ? Baseline --   ? Time --   ? Period --   ? Status --   ? Target Date --   ? ?  ?  ? ?  ? ?  PT Long Term Goals - 01/10/22 1158   ? ?  ? PT LONG TERM GOAL #1  ? Title Pt will be modified independent with household amb. without use of assistive device.   ? Baseline Pt reports he is walking in the house with the RW independently   ? Time 8   ? Period Weeks   ? Status On-going   ? Target Date 03/08/22   ?  ? PT LONG TERM GOAL #2  ? Title Pt will amb. without device with CGA 500' for increased community accessibility.   ? Time 8   ? Period Weeks   ? Status Not Met   ? Target Date 03/08/22   ?  ? PT LONG TERM GOAL #3  ? Title Improve TUG score to </= 20 secs with appropriate assistive device to demo improved functional mobility and to reduce fall risk.   ? Baseline 1".07 secs with RW - 10-16-21;  TUG with RW- 22.97 secs,   21.84 secs without RW - needs CGA for safety   ? Time 8   ? Period Weeks   ? Status New   ? Target Date 03/08/22   ?  ? PT LONG TERM GOAL #4  ? Title Increase Berg score to >/= 45/56 to reduce fall risk.   ? Baseline 27/56;     01-10-22 = 38/56   ? Time 8   ? Period Weeks   ? Status On-going   ? Target Date 03/08/22   ?  ? PT LONG TERM GOAL #5  ? Title Increase gait velocity from .43 ft/sec to >/= 2.0 ft/sec with RW for increased gait efficiency.   ? Baseline 17.59 secs = 1.86 ft/sec with RW   ? Time 8   ? Period Weeks   ? Status New   ? Target Date 03/08/22   ?  ? Additional Long Term Goals  ? Additional Long Term Goals Yes   ?  ? PT LONG TERM GOAL #6  ? Title Independent in updated HEP as appropriate.   ? Baseline met 01-10-22   ? Time 12   ? Period Weeks   ? Status Achieved   ? Target Date 01/11/22   ?  ? PT LONG TERM GOAL #7  ? Title Improve TUG score to  </= 17 secs without RW with SBA for increased safety with gait.   ? Baseline 21.84 secs without RW with min assist   ? Time 8   ? Period Weeks   ? Status New   ? Target Date 03/08/22   ? ?  ?  ? ?  ? ?ASSESSMENT

## 2022-02-05 ENCOUNTER — Ambulatory Visit: Payer: 59 | Admitting: Physical Therapy

## 2022-02-05 DIAGNOSIS — R278 Other lack of coordination: Secondary | ICD-10-CM

## 2022-02-05 DIAGNOSIS — R2689 Other abnormalities of gait and mobility: Secondary | ICD-10-CM

## 2022-02-05 DIAGNOSIS — R2681 Unsteadiness on feet: Secondary | ICD-10-CM | POA: Diagnosis not present

## 2022-02-05 NOTE — Therapy (Signed)
?OUTPATIENT PHYSICAL THERAPY TREATMENT NOTE ? ? ?Patient Name: Maurice Little ?MRN: [id number] ?DOB:[birth year], 66 y.o., male ?Today's Date: 02/05/2022 ? ?PCP: de [place], Raymond J, MD ?REFERRING PROVIDER: Cathlyn Parsons, PA-C ? ? PT End of Session - 02/05/22 1410   ? ? Visit Number 30   ? Number of Visits 39   ? Date for PT Re-Evaluation 03/08/22   ? Authorization Type [employer] ([place] with no auth required)   ? PT Start Time 9678   ? PT Stop Time 9381   ? PT Time Calculation (min) 44 min   ? Equipment Utilized During Treatment Gait belt   ? Activity Tolerance Patient tolerated treatment well   ? Behavior During Therapy [facility] for tasks assessed/performed   ? ?  ?  ? ?  ? ? ? ?Past Medical History:  ?Diagnosis Date  ? Benign prostatic hyperplasia with elevated prostate specific antigen (PSA)   ? Hypertension   ? Iliac artery stenosis, left (HCC)   ? with claudication resolved with stent  ? PAD (peripheral artery disease) ([place])   ? Stroke [facility])   ? ?Past Surgical History:  ?Procedure Laterality Date  ? BUBBLE STUDY  08/31/2021  ? Procedure: BUBBLE STUDY;  Surgeon: Skeet Latch, MD;  Location: [place];  Service: Cardiovascular;;  ? ILIAC ARTERY STENT Left   ? IR ANGIO VERTEBRAL SEL SUBCLAVIAN INNOMINATE UNI L MOD SED  08/30/2021  ? IR ANGIO VERTEBRAL SEL SUBCLAVIAN INNOMINATE UNI R MOD SED  08/30/2021  ? IR CT HEAD LTD  08/30/2021  ? IR INTRA CRAN STENT  08/30/2021  ? IR RADIOLOGIST EVAL & MGMT  11/28/2021  ? IR RADIOLOGIST EVAL & MGMT  01/12/2022  ? IR US GUIDE VASC ACCESS RIGHT  08/30/2021  ? RADIOLOGY WITH ANESTHESIA N/A 08/30/2021  ? Procedure: IR WITH ANESTHESIA;  Surgeon: Luanne Bras, MD;  Location: [place];  Service: Radiology;  Laterality: N/A;  ? TEE WITHOUT CARDIOVERSION N/A 08/31/2021  ? Procedure: TRANSESOPHAGEAL ECHOCARDIOGRAM (TEE);  Surgeon: Skeet Latch, MD;  Location: [place];  Service: Cardiovascular;  Laterality: N/A;  ? ?Patient Active Problem List  ? Diagnosis Date Noted  ? Cough 01/31/2022   ? Familial hypercholesteremia 11/06/2021  ? PFO (patent foramen ovale) 11/06/2021  ? Urinary retention 09/27/2021  ? Diplopia 09/27/2021  ? Carotid stenosis, right 09/27/2021  ? Anemia 09/27/2021  ? Slow transit constipation   ? Benign essential HTN   ? Cerebellar stroke ([place]) 09/04/2021  ? Acute ischemic stroke ([place])   ? Cerebral embolism with cerebral infarction 08/30/2021  ? 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 ? ?REFERRING DIAG: Cerebellar CVA ? ?THERAPY DIAG:  ?Unsteadiness on feet ? ?Other abnormalities of gait and mobility ? ?Other lack of coordination ? ?PERTINENT HISTORY: Diplopia, basilar artery stenosis, PFO, cerebral infarction 08/2021, essential HTN ? ?PRECAUTIONS: Fall ? ?SUBJECTIVE: Pt reports his weekend "was a blur". Feeling better, finished antibiotics. "I am killing it"  ? ?PAIN:  ?Are you having pain? No ?  ?TODAY'S TREATMENT: ? ?  TODAY'S SESSION  ?  Ther Ex ?SciFit level 3 on hill mode for 8 minutes w/BUE/BLEs fo dynamic cardiovascular warmup, UE/LE coordination, interval training and priming for reciprocal movement. AVG speed: 80 steps/min, total of 644 steps  ? ?Gait pattern: step to pattern, step through pattern, decreased arm swing- Right, decreased arm swing- Left, decreased step length- Right, decreased step length- Left, decreased stance time- Right, Right steppage,  Left steppage, Right foot flat, ataxic, and poor foot clearance- Right ?Distance walked: various clinic distances  ?Assistive device utilized: Noneand RW ?Level of assistance: CGA and Min A ?Comments: Pt ambulated various clinic distances w/RW and SBA. Pt ambulated 35' from [place] to mat table w/min guard, noted significant difficulty with turn to sit. Pt then ambulated 115' w/CGA w/emphasis on symmetric step length and consistence cadence. No LOB or scissoring of RLE noted, pt trending towards arm swing w/gait.  ? ?NMR  ?Updated and  demonstrated HEP (see below) w/emphasis on core stability, proprioception of R hemibody and single leg stance. Lengthy education on benefits of high intensity and specific exercise for stroke rehab. Encouraged pt to perform 150+ minutes of exercise per week for increased functional gains.  ? ? ?PATIENT EDUCATION: ?Education details: HEP updates, adding a walking program, benefits of high intensity exercise, quality vs quantity of movement  ?Person educated: Patient and Wife [name])  ?Education method: Explanation and handout  ?Education comprehension: verbalized understanding and needs further education  ? ? ?HEP  ?Access Code: [email or web address] ?URL: [email or web address] ?Date: 02/05/2022 ?Prepared by: Mickie Bail Jermesha Sottile ? ?Exercises ?- Bird Dog  - 1 x daily - 7 x weekly - 3 sets - 10 reps ?- Tall to Half Kneeling w/RUE support on cane  - 1 x daily - 7 x weekly - 3 sets - 10 reps ?- Standing march with overhead reach  - 1 x daily - 7 x weekly - 3 sets - 10 reps ? ? ?GOALS ?  ? PT Short Term Goals - 01/11/22 1017   ? ?  ? PT SHORT TERM GOAL #1  ? Title Improve Berg score from 38/56 to >/= 41/56 to decrease fall risk.   ? Baseline score 38/56 on 01-10-22   ? Time 4   ? Period Weeks   ? Status New   ? Target Date 02/08/22   ?  ? PT SHORT TERM GOAL #2  ? Title Improve TUG score from 21.84 secs with RW to </= 19 secs with RW with SBA only (no physical assist needed) to increase safety with ambulation.   ? Baseline 21.84 secs with RW with CGA to min assist - ataxia in RLE   ? Time 4   ? Period Weeks   ? Status New   ? Target Date 02/08/22   ?  ? PT SHORT TERM GOAL #3  ? Title Pt will amb. 230' without device with CGA on flat, even surface for increased community accessibility.   ? Baseline --   ? Time 4   ? Period Weeks   ? Status New   ? Target Date 02/08/22   ?  ? PT SHORT TERM GOAL #4  ? Title Pt will negotiate 4 steps with use of bil. hand rails with SBA   ? Baseline --   ? Time 4   ? Period Weeks   ? Status New   ?  Target Date 02/08/22   ?  ? PT SHORT TERM GOAL #5  ? Title Independent in updated HEP for balance exercises and pt will report performing HEP on regular basis.   ? Baseline --   ? Time 4   ? Period Weeks   ? Status New   ? Target Date 02/08/22   ?  ? PT SHORT TERM GOAL #6  ? Title --   ? Baseline --   ? Time --   ? Period --   ? Status --   ?  Target Date --   ? ?  ?  ? ?  ? ? PT Long Term Goals - 01/10/22 1158   ? ?  ? PT LONG TERM GOAL #1  ? Title Pt will be modified independent with household amb. without use of assistive device.   ? Baseline Pt reports he is walking in the house with the RW independently   ? Time 8   ? Period Weeks   ? Status On-going   ? Target Date 03/08/22   ?  ? PT LONG TERM GOAL #2  ? Title Pt will amb. without device with CGA 500' for increased community accessibility.   ? Time 8   ? Period Weeks   ? Status Not Met   ? Target Date 03/08/22   ?  ? PT LONG TERM GOAL #3  ? Title Improve TUG score to </= 20 secs with appropriate assistive device to demo improved functional mobility and to reduce fall risk.   ? Baseline 1".07 secs with RW - 10-16-21;  TUG with RW- 22.97 secs,   21.84 secs without RW - needs CGA for safety   ? Time 8   ? Period Weeks   ? Status New   ? Target Date 03/08/22   ?  ? PT LONG TERM GOAL #4  ? Title Increase Berg score to >/= 45/56 to reduce fall risk.   ? Baseline 27/56;     01-10-22 = 38/56   ? Time 8   ? Period Weeks   ? Status On-going   ? Target Date 03/08/22   ?  ? PT LONG TERM GOAL #5  ? Title Increase gait velocity from .43 ft/sec to >/= 2.0 ft/sec with RW for increased gait efficiency.   ? Baseline 17.59 secs = 1.86 ft/sec with RW   ? Time 8   ? Period Weeks   ? Status New   ? Target Date 03/08/22   ?  ? Additional Long Term Goals  ? Additional Long Term Goals Yes   ?  ? PT LONG TERM GOAL #6  ? Title Independent in updated HEP as appropriate.   ? Baseline met 01-10-22   ? Time 12   ? Period Weeks   ? Status Achieved   ? Target Date 01/11/22   ?  ? PT LONG TERM  GOAL #7  ? Title Improve TUG score to </= 17 secs without RW with SBA for increased safety with gait.   ? Baseline 21.84 secs without RW with min assist   ? Time 8   ? Period Weeks   ? Status New   ? Target

## 2022-02-07 NOTE — Progress Notes (Signed)
?Cardiology Office Note:   ? ?Date:  02/08/2022  ? ?ID:  Maurice Little, DOB Nov 20, 1955, MRN [id number] ? ?PCP:  [place], Raymond J, MD ?  ?[facility] Providers ?Cardiologist:  Christell Constant, MD    ? ?Referring MD: [place], Buren Kos, MD  ? ?CC: Secondary stroke prevention ? ?History of Present Illness:   ? ?Maurice Little is a 66 y.o. male with a hx of HTN, FH, PAD s/p Ilaiac Artery Stenosis (previously saw Dr. Lucky Cowboy Med with 2016 10 x 37 mm expandable stent to the left common iliac artery 95% stenosis.  20% right common iliac artery stenosis)  Had a 11/8//22 Cerebellar stroke with small PFO seen.   ?2023: had no AF on monitor; asymptomatic SVT. ? ?Patient notes that he is doing good.   ?Has been doing a lot of PT and OT therapy.  Has graduated from OT.  ?Still working on [employer]. ?There are no interval hospital/ED visit.   ? ?No chest pain or pressure .  No SOB/DOE and no PND/Orthopnea.  No weight gain or leg swelling.  No palpitations or syncope. ? ? ?Past Medical History:  ?Diagnosis Date  ? Benign prostatic hyperplasia with elevated prostate specific antigen (PSA)   ? Hypertension   ? Iliac artery stenosis, left (HCC)   ? with claudication resolved with stent  ? PAD (peripheral artery disease) (HCC)   ? Stroke [facility])   ? ? ?Past Surgical History:  ?Procedure Laterality Date  ? BUBBLE STUDY  08/31/2021  ? Procedure: BUBBLE STUDY;  Surgeon: Chilton Si, MD;  Location: [facility] ENDOSCOPY;  Service: Cardiovascular;;  ? ILIAC ARTERY STENT Left   ? IR ANGIO VERTEBRAL SEL SUBCLAVIAN INNOMINATE UNI L MOD SED  08/30/2021  ? IR ANGIO VERTEBRAL SEL SUBCLAVIAN INNOMINATE UNI R MOD SED  08/30/2021  ? IR CT HEAD LTD  08/30/2021  ? IR INTRA CRAN STENT  08/30/2021  ? IR RADIOLOGIST EVAL & MGMT  11/28/2021  ? IR RADIOLOGIST EVAL & MGMT  01/12/2022  ? IR US GUIDE VASC ACCESS RIGHT  08/30/2021  ? RADIOLOGY WITH ANESTHESIA N/A 08/30/2021  ? Procedure: IR WITH ANESTHESIA;  Surgeon: Julieanne Cotton, MD;  Location: [facility];   Service: Radiology;  Laterality: N/A;  ? TEE WITHOUT CARDIOVERSION N/A 08/31/2021  ? Procedure: TRANSESOPHAGEAL ECHOCARDIOGRAM (TEE);  Surgeon: Chilton Si, MD;  Location: [facility] ENDOSCOPY;  Service: Cardiovascular;  Laterality: N/A;  ? ? ?Current Medications: ?Current Meds  ?Medication Sig  ? acetaminophen (TYLENOL) 325 MG tablet Take 1-2 tablets (325-650 mg total) by mouth every 4 (four) hours as needed for mild pain.  ? amLODipine (NORVASC) 5 MG tablet Take 1 tablet (5 mg total) by mouth daily after supper.  ? artificial tears (LACRILUBE) OINT ophthalmic ointment Place into both eyes at bedtime.  ? aspirin EC 81 MG tablet Take 1 tablet (81 mg total) by mouth daily. Swallow whole.  ? benzonatate (TESSALON) 200 MG capsule Take 1 capsule (200 mg total) by mouth 3 (three) times daily as needed for cough.  ? BRILINTA 90 MG TABS tablet TAKE 1 TABLET BY MOUTH 2 TIMES DAILY.  ? citalopram (CELEXA) 10 MG tablet TAKE 1 TABLET BY MOUTH EVERY DAY  ? Cyanocobalamin (B-12 PO) Take 1 tablet by mouth daily.  ? ezetimibe (ZETIA) 10 MG tablet Take 10 mg by mouth daily.  ? FLAREX 0.1 % ophthalmic suspension Place 1 drop into the right eye daily.  ? losartan (COZAAR) 25 MG tablet Take 1 tablet (  25 mg total) by mouth daily.  ? pantoprazole (PROTONIX) 40 MG tablet TAKE 1 TABLET BY MOUTH EVERY DAY  ? rosuvastatin (CRESTOR) 40 MG tablet Take 1 tablet (40 mg total) by mouth daily.  ? senna-docusate (SENOKOT-S) 8.6-50 MG tablet Take 2 tablets by mouth 2 (two) times daily.  ? [DISCONTINUED] hydrALAZINE (APRESOLINE) 10 MG tablet TAKE 1 TABLET BY MOUTH EVERY 8 HOURS.  ?  ? ?Allergies:   Duloxetine  ? ?Social History  ? ?Socioeconomic History  ? Marital status: Married  ?  Spouse name: [name]  ? Number of children: Not on file  ? Years of education: Not on file  ? Highest education level: Bachelor's degree (e.g., BA, AB, BS)  ?Occupational History  ? Not on file  ?Tobacco Use  ? Smoking status: Former  ?  Types: Cigarettes  ? Smokeless  tobacco: Never  ? Tobacco comments:  ?  Quit 2000  ?Vaping Use  ? Vaping Use: Never used  ?Substance and Sexual Activity  ? Alcohol use: Not Currently  ? Drug use: Never  ? Sexual activity: Not on file  ?Other Topics Concern  ? Not on file  ?Social History Narrative  ? Lives with wife  ? ?Social Determinants of Health  ? ?Financial Resource Strain: Not on file  ?Food Insecurity: Not on file  ?Transportation Needs: Not on file  ?Physical Activity: Not on file  ?Stress: Not on file  ?Social Connections: Not on file  ?  ?Social: Comes with wife no kids ? ?Family History: ?The patient's family history includes Healthy in his mother. ?No prior history of CAD in family ? ?ROS:   ?Please see the history of present illness.    ? All other systems reviewed and are negative. ? ?EKGs/Labs/Other Studies Reviewed:   ? ?The following studies were reviewed today: ? ?Cardiac Event Monitoring: ?Do not have study or results ? ?Transthoracic Echocardiogram: ?Date: 08/30/21 ?Results: ? 1. Left ventricular ejection fraction, by estimation, is 60 to 65%. The  ?left ventricle has normal function. The left ventricle has no regional  ?wall motion abnormalities. Left ventricular diastolic parameters are  ?consistent with Grade II diastolic  ?dysfunction (pseudonormalization).  ? 2. Right ventricular systolic function is normal. The right ventricular  ?size is normal. Tricuspid regurgitation signal is inadequate for assessing  ?PA pressure.  ? 3. The mitral valve is normal in structure. No evidence of mitral valve  ?regurgitation. No evidence of mitral stenosis.  ? 4. The aortic valve is tricuspid. Aortic valve regurgitation is not  ?visualized. Mild aortic valve sclerosis is present, with no evidence of  ?aortic valve stenosis.  ? 5. The inferior vena cava is normal in size with greater than 50%  ?respiratory variability, suggesting right atrial pressure of 3 mmHg.  ? ?Transesophageal Echocardiogram: ?Date: 08/31/21 ?Results: ? ?Moderate  Bubbles ~ 8 beats in with moderate descending aortic atheroma ? ?1. Left ventricular ejection fraction, by estimation, is 55 to 60%. The  ?left ventricle has normal function. The left ventricle has no regional  ?wall motion abnormalities.  ? 2. Right ventricular systolic function is normal. The right ventricular  ?size is normal.  ? 3. No left atrial/left atrial appendage thrombus was detected.  ? 4. The mitral valve is normal in structure. Trivial mitral valve  ?regurgitation. No evidence of mitral stenosis.  ? 5. The aortic valve is tricuspid. Aortic valve regurgitation is not  ?visualized. No aortic stenosis is present.  ? 6. There is Moderate (Grade III) atheroma  plaque involving the descending  ?aorta.  ? 7. The inferior vena cava is normal in size with greater than 50%  ?respiratory variability, suggesting right atrial pressure of 3 mmHg.  ? 8. Agitated saline contrast bubble study was positive with shunting  ?observed within 3-6 cardiac cycles suggestive of interatrial shunt. There  ?is a small patent foramen ovale with predominantly right to left shunting  ?across the atrial septum.  ? ? ?Recent Labs: ?09/05/2021: Magnesium 2.1 ?09/24/2021: BUN 15; Hemoglobin 10.9; Platelets 311; Potassium 4.5; Sodium 135 ?11/06/2021: ALT 16 ?12/26/2021: Creatinine, Ser 1.00  ?Recent Lipid Panel ?   ?Component Value Date/Time  ? CHOL 116 01/25/2022 0756  ? TRIG 56 01/25/2022 0756  ? HDL 55 01/25/2022 0756  ? CHOLHDL 2.1 01/25/2022 0756  ? CHOLHDL 5.5 08/29/2021 2340  ? VLDL 16 08/29/2021 2340  ? LDLCALC 48 01/25/2022 0756  ? ?    ? ?Physical Exam:   ? ?VS:  BP 118/82   Pulse 72   Ht 6' (1.829 m)   Wt 226 lb (102.5 kg)   SpO2 97%   BMI 30.65 kg/m?    ? ?Wt Readings from Last 3 Encounters:  ?02/08/22 226 lb (102.5 kg)  ?02/08/22 226 lb (102.5 kg)  ?11/15/21 205 lb (93 kg)  ?  ? ?Gen: No distress   ?Neck: No JVD, Bilateral soft carotid bruit ?Ears: Bilateral Homero FellersFrank Sign ?Cardiac: No Rubs or Gallops, no murmur, regular rhythm,  +2 radial pulses ?Respiratory: Clear to auscultation bilaterally, normal effort, normal  respiratory rate ?GI: Soft, nontender, non-distended  ?MS: No edema ?Integument: Skin feels warm ?Neuro:  At time of evaluation, a

## 2022-02-08 ENCOUNTER — Encounter: Payer: 59 | Attending: Registered Nurse | Admitting: Physical Medicine & Rehabilitation

## 2022-02-08 ENCOUNTER — Encounter: Payer: Self-pay | Admitting: Internal Medicine

## 2022-02-08 ENCOUNTER — Ambulatory Visit: Payer: 59 | Admitting: Internal Medicine

## 2022-02-08 ENCOUNTER — Ambulatory Visit: Payer: 59 | Admitting: Physical Therapy

## 2022-02-08 ENCOUNTER — Encounter: Payer: Self-pay | Admitting: Physical Medicine & Rehabilitation

## 2022-02-08 VITALS — BP 118/82 | HR 72 | Ht 72.0 in | Wt 226.0 lb

## 2022-02-08 VITALS — BP 126/79 | HR 66 | Ht 72.0 in | Wt 226.0 lb

## 2022-02-08 DIAGNOSIS — G510 Bell's palsy: Secondary | ICD-10-CM | POA: Insufficient documentation

## 2022-02-08 DIAGNOSIS — H4920 Sixth [abducent] nerve palsy, unspecified eye: Secondary | ICD-10-CM | POA: Insufficient documentation

## 2022-02-08 DIAGNOSIS — R2681 Unsteadiness on feet: Secondary | ICD-10-CM | POA: Diagnosis not present

## 2022-02-08 DIAGNOSIS — I639 Cerebral infarction, unspecified: Secondary | ICD-10-CM | POA: Insufficient documentation

## 2022-02-08 DIAGNOSIS — R2689 Other abnormalities of gait and mobility: Secondary | ICD-10-CM

## 2022-02-08 DIAGNOSIS — Q2112 Patent foramen ovale: Secondary | ICD-10-CM

## 2022-02-08 DIAGNOSIS — E7801 Familial hypercholesterolemia: Secondary | ICD-10-CM

## 2022-02-08 DIAGNOSIS — R278 Other lack of coordination: Secondary | ICD-10-CM

## 2022-02-08 NOTE — Patient Instructions (Signed)
Medication Instructions:  ?Your physician has recommended you make the following change in your medication:  ?STOP: hydralazine ?*If you need a refill on your cardiac medications before your next appointment, please call your pharmacy* ? ? ?Lab Work: ?NONE ?If you have labs (blood work) drawn today and your tests are completely normal, you will receive your results only by: ?MyChart Message (if you have MyChart) OR ?A paper copy in the mail ?If you have any lab test that is abnormal or we need to change your treatment, we will call you to review the results. ? ? ?Testing/Procedures: ?NONE ? ? ?Follow-Up: ?At [facility], you and your health needs are our priority.  As part of our continuing mission to provide you with exceptional heart care, we have created designated Provider Care Teams.  These Care Teams include your primary Cardiologist (physician) and Advanced Practice Providers (APPs -  Physician Assistants and Nurse Practitioners) who all work together to provide you with the care you need, when you need it. ? ?We recommend signing up for the patient portal called "MyChart".  Sign up information is provided on this After Visit Summary.  MyChart is used to connect with patients for Virtual Visits (Telemedicine).  Patients are able to view lab/test results, encounter notes, upcoming appointments, etc.  Non-urgent messages can be sent to your provider as well.   ?To learn more about what you can do with MyChart, go to [email or web address].   ? ?Your next appointment:   ?1 year(s) ? ?The format for your next appointment:   ?In Person ? ?Provider:   ?Christell Constant, MD   ? ? ?Important Information About Sugar ? ? ? ? ?  ?

## 2022-02-08 NOTE — Therapy (Signed)
?OUTPATIENT PHYSICAL THERAPY TREATMENT NOTE ? ? ?Patient Name: Maurice Little ?MRN: [id number] ?DOB:03/18/1956, 66 y.o., male ?Today's Date: 02/08/2022 ? ?PCP: de [place], Raymond J, MD ?REFERRING PROVIDER: Cathlyn Parsons, PA-C ? ? PT End of Session - 02/08/22 0803   ? ? Visit Number 31   ? Number of Visits 39   ? Date for PT Re-Evaluation 03/08/22   ? Authorization Type UHC ([place] with no auth required)   ? PT Start Time 0800   ? PT Stop Time 0848   ? PT Time Calculation (min) 48 min   ? Equipment Utilized During Treatment Gait belt   ? Activity Tolerance Patient tolerated treatment well   ? Behavior During Therapy  Hospital for tasks assessed/performed   ? ?  ?  ? ?  ? ? ? ?Past Medical History:  ?Diagnosis Date  ? Benign prostatic hyperplasia with elevated prostate specific antigen (PSA)   ? Hypertension   ? Iliac artery stenosis, left (HCC)   ? with claudication resolved with stent  ? PAD (peripheral artery disease) ([place])   ? Stroke [facility])   ? ?Past Surgical History:  ?Procedure Laterality Date  ? BUBBLE STUDY  08/31/2021  ? Procedure: BUBBLE STUDY;  Surgeon: Skeet Latch, MD;  Location: [place];  Service: Cardiovascular;;  ? ILIAC ARTERY STENT Left   ? IR ANGIO VERTEBRAL SEL SUBCLAVIAN INNOMINATE UNI L MOD SED  08/30/2021  ? IR ANGIO VERTEBRAL SEL SUBCLAVIAN INNOMINATE UNI R MOD SED  08/30/2021  ? IR CT HEAD LTD  08/30/2021  ? IR INTRA CRAN STENT  08/30/2021  ? IR RADIOLOGIST EVAL & MGMT  11/28/2021  ? IR RADIOLOGIST EVAL & MGMT  01/12/2022  ? IR US GUIDE VASC ACCESS RIGHT  08/30/2021  ? RADIOLOGY WITH ANESTHESIA N/A 08/30/2021  ? Procedure: IR WITH ANESTHESIA;  Surgeon: Luanne Bras, MD;  Location: [place];  Service: Radiology;  Laterality: N/A;  ? TEE WITHOUT CARDIOVERSION N/A 08/31/2021  ? Procedure: TRANSESOPHAGEAL ECHOCARDIOGRAM (TEE);  Surgeon: Skeet Latch, MD;  Location: [place];  Service: Cardiovascular;  Laterality: N/A;  ? ?Patient Active Problem List  ? Diagnosis Date Noted  ? Cough 01/31/2022   ? Familial hypercholesteremia 11/06/2021  ? PFO (patent foramen ovale) 11/06/2021  ? Urinary retention 09/27/2021  ? Diplopia 09/27/2021  ? Carotid stenosis, right 09/27/2021  ? Anemia 09/27/2021  ? Slow transit constipation   ? Benign essential HTN   ? Cerebellar stroke ([place]) 09/04/2021  ? Cerebral embolism with cerebral infarction 08/30/2021  ? 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 ? ?REFERRING DIAG: Cerebellar CVA ? ?THERAPY DIAG:  ?Unsteadiness on feet ? ?Other abnormalities of gait and mobility ? ?Other lack of coordination ? ?PERTINENT HISTORY: Diplopia, basilar artery stenosis, PFO, cerebral infarction 08/2021, essential HTN ? ?PRECAUTIONS: Fall ? ?SUBJECTIVE: Pt reports he had a fall yesterday. Was reaching behind to R side and lost balance posteriorly, caught back on corner of coffee table and has significant bruise/abrasion. No other injuries. "I was being stupid"  ? ?PAIN:  ?Are you having pain? Yes: NPRS scale: 2/10 ?Pain location: Back on R side- where he fell ?Pain description: sore/tender  ?  ?TODAY'S TREATMENT: ? ?  TODAY'S SESSION  ?  Ther Act ?  STG assessment  ?   ? [facility] PT Assessment - 02/08/22 0813   ? ?  ? Berg Balance Test  ? Sit to Stand Able to stand without using  hands and stabilize independently   ? Standing Unsupported Able to stand 2 minutes with supervision   Wide BOS  ? Sitting with Back Unsupported but Feet Supported on Floor or Stool Able to sit safely and securely 2 minutes   ? Stand to Sit Sits safely with minimal use of hands   ? Transfers Able to transfer safely, minor use of hands   Very slow but controlled, no LOB  ? Standing Unsupported with Eyes Closed Able to stand 10 seconds with supervision   ? Standing Unsupported with Feet Together Able to place feet together independently and stand for 1 minute with supervision   ? From Standing, Reach Forward with Outstretched Arm Can reach  confidently >25 cm (10")   ? From Standing Position, Pick up Object from Floor Able to pick up shoe, needs supervision   ? From Standing Position, Turn to Look Behind Over each Shoulder Looks behind one side only/other side shows less weight shift   L > R  ? Turn 360 Degrees Able to turn 360 degrees safely but slowly   ? Standing Unsupported, Alternately Place Feet on Step/Stool Able to complete >2 steps/needs minimal assist   ? Standing Unsupported, One Foot in Front Able to plae foot ahead of the other independently and hold 30 seconds   ? Standing on One Leg Tries to lift leg/unable to hold 3 seconds but remains standing independently   ? Total Score 42   ? Berg comment: high fall risk   ?  ? Timed Up and Go Test  ? Normal TUG (seconds) 21.65   w/RW and SBA  ? ?  ?  ? ?  ? ? ?STAIRS: ? Level of Assistance: SBA ? Stair Negotiation Technique: Alternating Pattern  with Bilateral Rails ? Number of Stairs: 4  ? Height of Stairs: 6"  ?Comments: Pt ascended/descended steps slowly w/heavy reliance on BUEs but demonstrated good foot placement on step and no LOB  ? ?GAIT: ?Gait pattern: step through pattern, decreased arm swing- Right, decreased arm swing- Left, decreased step length- Right, decreased step length- Left, decreased stance time- Right, decreased hip/knee flexion- Right, decreased hip/knee flexion- Left, decreased ankle dorsiflexion- Right, Right hip hike, Right foot flat, scissoring, ataxic, wide BOS, narrow BOS, and poor foot clearance- Right ?Distance walked: 230'  ?Assistive device utilized: None ?Level of assistance: Min A and Mod A ?Comments: Pt ambulated two laps around gym, turning to L side. Noted inconsistent cadence, step placement and anterior LOB. Min-mod A to prevent fall due to ataxia, scissoring and mix of narrow/wide BOS.  ? ? Self-care/home management  ?Discussed results of STG assessment and emphasized importance of performing HEP/walking program at home for improved functional gains. Pt  reports he has a hard time finding motivation at home but will try to start performing HEP more consistently  ? ?PATIENT EDUCATION: ?Education details:See self-care  ?Person educated: Patient and Wife [name])  ?Education method: Explanation and handout  ?Education comprehension: verbalized understanding and needs further education  ? ? ?HEP  ?Access Code: [email or web address] ?URL: [email or web address] ?Date: 02/05/2022 ?Prepared by: Mickie Bail Makaiah Terwilliger ? ?Exercises ?- Bird Dog  - 1 x daily - 7 x weekly - 3 sets - 10 reps ?- Tall to Half Kneeling w/RUE support on cane  - 1 x daily - 7 x weekly - 3 sets - 10 reps ?- Standing march with overhead reach  - 1 x daily - 7 x weekly - 3 sets - 10 reps ? ? ?GOALS ?  ?  PT Short Term Goals - 01/11/22 1017   ? ?  ? PT SHORT TERM GOAL #1  ? Title Improve Berg score from 38/56 to >/= 41/56 to decrease fall risk.   ? Baseline score 38/56 on 01-10-22; 42/56 on 02/08/22  ? Time 4   ? Period Weeks   ? Status MET   ? Target Date 02/08/22   ?  ? PT SHORT TERM GOAL #2  ? Title Improve TUG score from 21.84 secs with RW to </= 19 secs with RW with SBA only (no physical assist needed) to increase safety with ambulation.   ? Baseline 21.84 secs with RW with CGA to min assist - ataxia in RLE; 21.65s w/SBA and RW  ? Time 4   ? Period Weeks   ? Status NOT MET    ? Target Date 02/08/22   ?  ? PT SHORT TERM GOAL #3  ? Title Pt will amb. 230' without device with CGA on flat, even surface for increased community accessibility.   ? Baseline --   ? Time 4   ? Period Weeks   ? Status NOT MET - 230' w/min A 2/2 scissoring of RLE   ? Target Date 02/08/22   ?  ? PT SHORT TERM GOAL #4  ? Title Pt will negotiate 4 steps with use of bil. hand rails with SBA   ? Baseline --   ? Time 4   ? Period Weeks   ? Status MET - step-through pattern    ? Target Date 02/08/22   ?  ? PT SHORT TERM GOAL #5  ? Title Independent in updated HEP for balance exercises and pt will report performing HEP on regular basis.   ?  Baseline --   ? Time 4   ? Period Weeks   ? Status NOT MET - Pt not performing    ? Target Date 02/08/22   ?  ? PT SHORT TERM GOAL #6  ? Title --   ? Baseline --   ? Time --   ? Period --   ? Status --   ? Target Date -

## 2022-02-08 NOTE — Progress Notes (Signed)
? ?Subjective:  ? ? Patient ID: Maurice Little, male    DOB: 09/21/1956, 66 y.o.   MRN: [id number] ?[age] y.o. male with history of HTN, PAD was admitted on 08/29/2021 with onset of dizziness and headaches that had started the day before and CT head done showing subacute right cerebellar infarct.  CTA head/neck done showing atherosclerosis with question of high-grade stenosis or nonocclusive clot in basilar artery.  He was loaded with DAPT and started on IV heparin.  He developed right facial droop with right hearing loss, diplopia with nystagmus as well as slurred speech with nausea the evening past admission.  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 Corliss Little.  ?HPI ?Still has ataxia on right ?Fell yesterday no injury, standing an twisting on R ? ?Seen by cardiology who stopped Hydralazine  ? ?Another CT head in September per IR ?Pain Inventory ?Average Pain 0 ?Pain Right Now 0 ?My pain is  no pain ? ?In the last 24 hours, has pain interfered with the following? ?General activity 0 ?Relation with others 0 ?Enjoyment of life 0 ?What TIME of day is your pain at its worst? no pain ?Sleep (in general) Good ? ?Pain is worse with: no pain ?Pain improves with: no pain ?Relief from Meds: no pain ? ?Family History  ?Problem Relation Age of Onset  ? Healthy Mother   ? ?Social History  ? ?Socioeconomic History  ? Marital status: Married  ?  Spouse name: Maurice Little  ? Number of children: Not on file  ? Years of education: Not on file  ? Highest education level: Bachelor's degree (e.g., BA, AB, BS)  ?Occupational History  ? Not on file  ?Tobacco Use  ? Smoking status: Former  ?  Types: Cigarettes  ? Smokeless tobacco: Never  ? Tobacco comments:  ?  Quit 2000  ?Vaping Use  ? Vaping Use: Never used  ?Substance and Sexual Activity  ? Alcohol use: Not Currently  ? Drug use: Never  ? Sexual activity: Not on file  ?Other Topics Concern  ? Not on file   ?Social History Narrative  ? Lives with wife  ? ?Social Determinants of Health  ? ?Financial Resource Strain: Not on file  ?Food Insecurity: Not on file  ?Transportation Needs: Not on file  ?Physical Activity: Not on file  ?Stress: Not on file  ?Social Connections: Not on file  ? ?Past Surgical History:  ?Procedure Laterality Date  ? BUBBLE STUDY  08/31/2021  ? Procedure: BUBBLE STUDY;  Surgeon: Chilton Siandolph, Tiffany, MD;  [facility];  Service: Cardiovascular;;  ? ILIAC ARTERY STENT Left   ? IR ANGIO VERTEBRAL SEL SUBCLAVIAN INNOMINATE UNI L MOD SED  08/30/2021  ? IR ANGIO VERTEBRAL SEL SUBCLAVIAN INNOMINATE UNI R MOD SED  08/30/2021  ? IR CT HEAD LTD  08/30/2021  ? IR INTRA CRAN STENT  08/30/2021  ? IR RADIOLOGIST EVAL & MGMT  11/28/2021  ? IR RADIOLOGIST EVAL & MGMT  01/12/2022  ? IR US GUIDE VASC ACCESS RIGHT  08/30/2021  ? RADIOLOGY WITH ANESTHESIA N/A 08/30/2021  ? Procedure: IR WITH ANESTHESIA;  Surgeon: Julieanne Cottoneveshwar, Sanjeev, MD;  [facility];  Service: Radiology;  Laterality: N/A;  ? TEE WITHOUT CARDIOVERSION N/A 08/31/2021  ? Procedure: TRANSESOPHAGEAL ECHOCARDIOGRAM (TEE);  Surgeon: Chilton Siandolph, Tiffany, MD;  [facility];  Service: Cardiovascular;  Laterality: N/A;  ? ?Past Surgical History:  ?Procedure Laterality Date  ? BUBBLE STUDY  08/31/2021  ? Procedure: BUBBLE STUDY;  Surgeon: Chilton Si, MD;  Location: [facility] ENDOSCOPY;  Service: Cardiovascular;;  ? ILIAC ARTERY STENT Left   ? IR ANGIO VERTEBRAL SEL SUBCLAVIAN INNOMINATE UNI L MOD SED  08/30/2021  ? IR ANGIO VERTEBRAL SEL SUBCLAVIAN INNOMINATE UNI R MOD SED  08/30/2021  ? IR CT HEAD LTD  08/30/2021  ? IR INTRA CRAN STENT  08/30/2021  ? IR RADIOLOGIST EVAL & MGMT  11/28/2021  ? IR RADIOLOGIST EVAL & MGMT  01/12/2022  ? IR US GUIDE VASC ACCESS RIGHT  08/30/2021  ? RADIOLOGY WITH ANESTHESIA N/A 08/30/2021  ? Procedure: IR WITH ANESTHESIA;  Surgeon: Julieanne Cotton, MD;  Location: [facility];  Service: Radiology;  Laterality: N/A;  ? TEE  WITHOUT CARDIOVERSION N/A 08/31/2021  ? Procedure: TRANSESOPHAGEAL ECHOCARDIOGRAM (TEE);  Surgeon: Chilton Si, MD;  Location: [facility] ENDOSCOPY;  Service: Cardiovascular;  Laterality: N/A;  ? ?Past Medical History:  ?Diagnosis Date  ? Benign prostatic hyperplasia with elevated prostate specific antigen (PSA)   ? Hypertension   ? Iliac artery stenosis, left (HCC)   ? with claudication resolved with stent  ? PAD (peripheral artery disease) (HCC)   ? Stroke [facility])   ? ?BP 126/79   Pulse 66   Ht 6' (1.829 m)   Wt 226 lb (102.5 kg)   SpO2 95%   BMI 30.65 kg/m?  ? ?Opioid Risk Score:   ?Fall Risk Score:  `1 ? ?Depression screen PHQ 2/9 ? ? ?  02/08/2022  ? 10:19 AM 11/12/2021  ?  1:33 PM 10/08/2021  ?  9:06 AM  ?Depression screen PHQ 2/9  ?Decreased Interest 0 0 0  ?Down, Depressed, Hopeless 0 0 0  ?PHQ - 2 Score 0 0 0  ?Altered sleeping   0  ?Tired, decreased energy   0  ?Change in appetite   0  ?Feeling bad or failure about yourself    0  ?Trouble concentrating   0  ?Moving slowly or fidgety/restless   0  ?Suicidal thoughts   0  ?PHQ-9 Score   0  ?  ? ?Review of Systems  ?Constitutional: Negative.   ?HENT: Negative.    ?Eyes: Negative.   ?Respiratory: Negative.    ?Cardiovascular: Negative.   ?Gastrointestinal: Negative.   ?Endocrine: Negative.   ?Genitourinary: Negative.   ?Musculoskeletal: Negative.   ?Skin: Negative.   ?Allergic/Immunologic: Negative.   ?Neurological: Negative.   ?Hematological: Negative.   ?Psychiatric/Behavioral: Negative.    ?All other systems reviewed and are negative. ? ?   ?Objective:  ? Physical Exam ?Vitals and nursing note reviewed.  ?Constitutional:   ?   Appearance: He is obese.  ?HENT:  ?   Head: Normocephalic and atraumatic.  ?Eyes:  ?   Extraocular Movements: Extraocular movements intact.  ?   Conjunctiva/sclera: Conjunctivae normal.  ?   Pupils: Pupils are equal, round, and reactive to light.  ?Neurological:  ?   Mental Status: He is alert and oriented to person, place, and time.   ?   Cranial Nerves: Cranial nerve deficit and dysarthria present.  ?   Sensory: Sensation is intact.  ?   Motor: Weakness present.  ?   Coordination: Coordination abnormal. Impaired rapid alternating movements.  ?   Gait: Gait abnormal.  ?   Comments: Motor strength is 5/5 bilateral deltoid bicep tricep grip hip flexor knee extensor ankle dorsiflexor ?Cerebellar mild dysmetria finger-nose-finger on the right side ?There is moderate problems with rapid alternating movement right supination pronation  of the upper extremity. ?Sensation reduced to pinprick left upper extremity as well as left-sided facial. ?Cranial nerve VII mild weakness with lid closure ?Cranial nerve IV skew deviation ?Standing balance is fair sit to stand is with close supervision but required contact guard assistance when getting up without using his hands for support  ?Psychiatric:     ?   Mood and Affect: Mood normal.  ? ? ? ? ? ?   ?Assessment & Plan:  ?#1.  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 ?Continue outpatient therapy ?Encouraged patient to do home exercise program on a regular basis ?Physical medicine rehab follow-up in 3 months ?Follow-up with IR ?Follow-up with neuro-ophthalmology ?Follow-up with PCP ? ?

## 2022-02-11 ENCOUNTER — Other Ambulatory Visit (HOSPITAL_BASED_OUTPATIENT_CLINIC_OR_DEPARTMENT_OTHER): Payer: Self-pay | Admitting: Family Medicine

## 2022-02-12 ENCOUNTER — Other Ambulatory Visit (HOSPITAL_BASED_OUTPATIENT_CLINIC_OR_DEPARTMENT_OTHER): Payer: Self-pay

## 2022-02-12 ENCOUNTER — Ambulatory Visit: Payer: 59 | Admitting: Physical Therapy

## 2022-02-12 DIAGNOSIS — R278 Other lack of coordination: Secondary | ICD-10-CM

## 2022-02-12 DIAGNOSIS — R2681 Unsteadiness on feet: Secondary | ICD-10-CM | POA: Diagnosis not present

## 2022-02-12 DIAGNOSIS — R2689 Other abnormalities of gait and mobility: Secondary | ICD-10-CM

## 2022-02-12 NOTE — Therapy (Signed)
?OUTPATIENT PHYSICAL THERAPY TREATMENT NOTE ? ? ?Patient Name: Maurice Little ?MRN: [id number] ?DOB:Jul 09, 1956, 66 y.o., male ?Today's Date: 02/13/2022 ? ?PCP: de [place], Raymond J, MD ?REFERRING PROVIDER: Cathlyn Parsons, PA-C ? ? PT End of Session - 02/13/22 1133   ? ? Visit Number 32   ? Number of Visits 39   ? Date for PT Re-Evaluation 03/08/22   ? Authorization Type UHC ([place] with no auth required)   ? PT Start Time [phone]   ? PT Stop Time 0930   ? PT Time Calculation (min) 40 min   ? Equipment Utilized During Treatment Gait belt   ? Activity Tolerance Patient tolerated treatment well   ? Behavior During Therapy [facility] for tasks assessed/performed   ? ?  ?  ? ?  ? ? ? ? ?Past Medical History:  ?Diagnosis Date  ? Benign prostatic hyperplasia with elevated prostate specific antigen (PSA)   ? Hypertension   ? Iliac artery stenosis, left (HCC)   ? with claudication resolved with stent  ? PAD (peripheral artery disease) ([place])   ? Stroke [facility])   ? ?Past Surgical History:  ?Procedure Laterality Date  ? BUBBLE STUDY  08/31/2021  ? Procedure: BUBBLE STUDY;  Surgeon: Skeet Latch, MD;  Location: [place];  Service: Cardiovascular;;  ? ILIAC ARTERY STENT Left   ? IR ANGIO VERTEBRAL SEL SUBCLAVIAN INNOMINATE UNI L MOD SED  08/30/2021  ? IR ANGIO VERTEBRAL SEL SUBCLAVIAN INNOMINATE UNI R MOD SED  08/30/2021  ? IR CT HEAD LTD  08/30/2021  ? IR INTRA CRAN STENT  08/30/2021  ? IR RADIOLOGIST EVAL & MGMT  11/28/2021  ? IR RADIOLOGIST EVAL & MGMT  01/12/2022  ? IR US GUIDE VASC ACCESS RIGHT  08/30/2021  ? RADIOLOGY WITH ANESTHESIA N/A 08/30/2021  ? Procedure: IR WITH ANESTHESIA;  Surgeon: Luanne Bras, MD;  Location: [place];  Service: Radiology;  Laterality: N/A;  ? TEE WITHOUT CARDIOVERSION N/A 08/31/2021  ? Procedure: TRANSESOPHAGEAL ECHOCARDIOGRAM (TEE);  Surgeon: Skeet Latch, MD;  Location: [place];  Service: Cardiovascular;  Laterality: N/A;  ? ?Patient Active Problem List  ? Diagnosis Date Noted  ? 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 Cough 01/31/2022  ? Familial hypercholesteremia 11/06/2021  ? PFO (patent foramen ovale) 11/06/2021  ? Urinary retention 09/27/2021  ? Diplopia 09/27/2021  ? Carotid stenosis, right 09/27/2021  ? Anemia 09/27/2021  ? Slow transit constipation   ? Benign essential HTN   ? Cerebellar stroke ([place]) 09/04/2021  ? Cerebral embolism with cerebral infarction 08/30/2021  ? 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 ? ?REFERRING DIAG: Cerebellar CVA ? ?THERAPY DIAG:  ?Unsteadiness on feet ? ?Other abnormalities of gait and mobility ? ?Other lack of coordination ? ?PERTINENT HISTORY: Diplopia, basilar artery stenosis, PFO, cerebral infarction 08/2021, essential HTN ? ?PRECAUTIONS: Fall ? ?SUBJECTIVE: Pt reports he hasn't done exercises at home yet - states he was working on putting together an Building surveyor together.  Has ordered a scooter coming on Friday  ? ?PAIN:  ?Are you having pain? Yes: NPRS scale: 2/10 ?Pain location: Back on R side- where he fell ?Pain description: sore/tender  ?  ?TODAY'S TREATMENT: ? ?  TODAY'S SESSION  ?  Neuro Re-ed:     ?   Pt performed sit to stand 5 reps from mat without UE support with CGA to stabilize ? Pt performed  coordination/SLS activity inside // bars - touching balance bubbles (2) and then colored flat discs (3) with each foot with gradual         decrease in UE support ?  Marching in place 10 reps each leg inside // bars with UE support prn ?  Standing unsupported with head turns 5 reps with CGA ?  Sidestepping 2 reps (4 lengths total) inside // bars with CGA with UE support prn for balance recovery ?  Forward and lateral stepping 5 reps each foot with CGA to min assist with cues for step size and placement ? ? ? ? ? ?GAIT: ?Gait pattern: step through pattern, decreased arm swing- Right, decreased arm swing- Left, decreased step length- Right,  decreased step length- Left, decreased stance time- Right, decreased hip/knee flexion- Right, decreased hip/knee flexion- Left, decreased ankle dorsiflexion- Right, Right hip hike, Right foot flat, scissoring, ataxic, wide BOS, narrow BOS, and poor foot clearance- Right ?Distance walked: 230'  ?Assistive device utilized: None ?Level of assistance: Min A and Mod A ?Comments: Pt ambulated 1 lap around gym, turning to Rt side. Noted inconsistent cadence, step placement and anterior LOB. Min-mod A to prevent fall due to ataxia ? ?  ?PATIENT EDUCATION: ?Education details: No changes made to HEP ?Person educated: Patient and Wife [name])  ?Education method: Explanation and handout  ?Education comprehension: verbalized understanding and needs further education  ? ? ?HEP  ?Access Code: [email or web address] ?URL: [email or web address] ?Date: 02/05/2022 ?Prepared by: Mickie Bail Plaster ? ?Exercises ?- Bird Dog  - 1 x daily - 7 x weekly - 3 sets - 10 reps ?- Tall to Half Kneeling w/RUE support on cane  - 1 x daily - 7 x weekly - 3 sets - 10 reps ?- Standing march with overhead reach  - 1 x daily - 7 x weekly - 3 sets - 10 reps ? ? ?GOALS ?  ? PT Short Term Goals - 01/11/22 1017   ? ?  ? PT SHORT TERM GOAL #1  ? Title Improve Berg score from 38/56 to >/= 41/56 to decrease fall risk.   ? Baseline score 38/56 on 01-10-22; 42/56 on 02/08/22  ? Time 4   ? Period Weeks   ? Status MET   ? Target Date 02/08/22   ?  ? PT SHORT TERM GOAL #2  ? Title Improve TUG score from 21.84 secs with RW to </= 19 secs with RW with SBA only (no physical assist needed) to increase safety with ambulation.   ? Baseline 21.84 secs with RW with CGA to min assist - ataxia in RLE; 21.65s w/SBA and RW  ? Time 4   ? Period Weeks   ? Status NOT MET    ? Target Date 02/08/22   ?  ? PT SHORT TERM GOAL #3  ? Title Pt will amb. 230' without device with CGA on flat, even surface for increased community accessibility.   ? Baseline --   ? Time 4   ? Period Weeks   ?  Status NOT MET - 230' w/min A 2/2 scissoring of RLE   ? Target Date 02/08/22   ?  ? PT SHORT TERM GOAL #4  ? Title Pt will negotiate 4 steps with use of bil. hand rails with SBA   ? Baseline --   ? Time 4   ? Period Weeks   ? Status MET - step-through pattern    ? Target Date 02/08/22   ?  ? PT SHORT  TERM GOAL #5  ? Title Independent in updated HEP for balance exercises and pt will report performing HEP on regular basis.   ? Baseline --   ? Time 4   ? Period Weeks   ? Status NOT MET - Pt not performing    ? Target Date 02/08/22   ?  ? PT SHORT TERM GOAL #6  ? Title --   ? Baseline --   ? Time --   ? Period --   ? Status --   ? Target Date --   ? ?  ?  ? ?  ? ? PT Long Term Goals - 01/10/22 1158   ? ?  ? PT LONG TERM GOAL #1  ? Title Pt will be modified independent with household amb. without use of assistive device.   ? Baseline Pt reports he is walking in the house with the RW independently   ? Time 8   ? Period Weeks   ? Status On-going   ? Target Date 03/08/22   ?  ? PT LONG TERM GOAL #2  ? Title Pt will amb. without device with CGA 500' for increased community accessibility.   ? Time 8   ? Period Weeks   ? Status Not Met   ? Target Date 03/08/22   ?  ? PT LONG TERM GOAL #3  ? Title Improve TUG score to </= 20 secs with appropriate assistive device to demo improved functional mobility and to reduce fall risk.   ? Baseline 1".07 secs with RW - 10-16-21;  TUG with RW- 22.97 secs,   21.84 secs without RW - needs CGA for safety   ? Time 8   ? Period Weeks   ? Status New   ? Target Date 03/08/22   ?  ? PT LONG TERM GOAL #4  ? Title Increase Berg score to >/= 45/56 to reduce fall risk.   ? Baseline 27/56;     01-10-22 = 38/56   ? Time 8   ? Period Weeks   ? Status On-going   ? Target Date 03/08/22   ?  ? PT LONG TERM GOAL #5  ? Title Increase gait velocity from .43 ft/sec to >/= 2.0 ft/sec with RW for increased gait efficiency.   ? Baseline 17.59 secs = 1.86 ft/sec with RW   ? Time 8   ? Period Weeks   ? Status New   ?  Target Date 03/08/22   ?  ? Additional Long Term Goals  ? Additional Long Term Goals Yes   ?  ? PT LONG TERM GOAL #6  ? Title Independent in updated HEP as appropriate.   ? Baseline met 01-10-22   ? Time 12   ?

## 2022-02-14 ENCOUNTER — Ambulatory Visit: Payer: 59 | Admitting: Physical Therapy

## 2022-02-14 DIAGNOSIS — R2689 Other abnormalities of gait and mobility: Secondary | ICD-10-CM

## 2022-02-14 DIAGNOSIS — R2681 Unsteadiness on feet: Secondary | ICD-10-CM

## 2022-02-14 DIAGNOSIS — R278 Other lack of coordination: Secondary | ICD-10-CM

## 2022-02-15 ENCOUNTER — Other Ambulatory Visit (HOSPITAL_BASED_OUTPATIENT_CLINIC_OR_DEPARTMENT_OTHER): Payer: Self-pay | Admitting: Family Medicine

## 2022-02-15 NOTE — Therapy (Signed)
?OUTPATIENT PHYSICAL THERAPY TREATMENT NOTE ? ? ?Patient Name: Maurice Little ?MRN: [id number] ?DOB:27-Jan-1956, 66 y.o., male ?Today's Date: 02/15/2022 ? ?PCP: de Little, Maurice J, MD ?REFERRING PROVIDER: Cathlyn Parsons, PA-C ? ? PT End of Session - 02/15/22 0910   ? ? Visit Number [id number]   ? Number of Visits 39   ? Date for PT Re-Evaluation 03/08/22   ? Authorization Type [employer] ([place] with no auth required)   ? PT Start Time 1149   ? PT Stop Time 1235   ? PT Time Calculation (min) 46 min   ? Equipment Utilized During Treatment Gait belt   ? Activity Tolerance Patient tolerated treatment well   ? Behavior During Therapy Maurice Little for tasks assessed/performed   ? ?  ?  ? ?  ? ? ? ? ?Past Medical History:  ?Diagnosis Date  ? Benign prostatic hyperplasia with elevated prostate specific antigen (PSA)   ? Hypertension   ? Iliac artery stenosis, left (HCC)   ? with claudication resolved with stent  ? PAD (peripheral artery disease) ([place])   ? Stroke [facility])   ? ?Past Surgical History:  ?Procedure Laterality Date  ? BUBBLE STUDY  08/31/2021  ? Procedure: BUBBLE STUDY;  Surgeon: Skeet Latch, MD;  Location: [place];  Service: Cardiovascular;;  ? ILIAC ARTERY STENT Left   ? IR ANGIO VERTEBRAL SEL SUBCLAVIAN INNOMINATE UNI L MOD SED  08/30/2021  ? IR ANGIO VERTEBRAL SEL SUBCLAVIAN INNOMINATE UNI R MOD SED  08/30/2021  ? IR CT HEAD LTD  08/30/2021  ? IR INTRA CRAN STENT  08/30/2021  ? IR RADIOLOGIST EVAL & MGMT  11/28/2021  ? IR RADIOLOGIST EVAL & MGMT  01/12/2022  ? IR US GUIDE VASC ACCESS RIGHT  08/30/2021  ? RADIOLOGY WITH ANESTHESIA N/A 08/30/2021  ? Procedure: IR WITH ANESTHESIA;  Surgeon: Luanne Bras, MD;  Location: [place];  Service: Radiology;  Laterality: N/A;  ? TEE WITHOUT CARDIOVERSION N/A 08/31/2021  ? Procedure: TRANSESOPHAGEAL ECHOCARDIOGRAM (TEE);  Surgeon: Skeet Latch, MD;  Location: [place];  Service: Cardiovascular;  Laterality: N/A;  ? ?Patient Active Problem List  ? Diagnosis Date Noted  ? 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 Cough 01/31/2022  ? Familial hypercholesteremia 11/06/2021  ? PFO (patent foramen ovale) 11/06/2021  ? Urinary retention 09/27/2021  ? Diplopia 09/27/2021  ? Carotid stenosis, right 09/27/2021  ? Anemia 09/27/2021  ? Slow transit constipation   ? Benign essential HTN   ? Cerebellar stroke ([place]) 09/04/2021  ? Cerebral embolism with cerebral infarction 08/30/2021  ? 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 ? ?REFERRING DIAG: Cerebellar CVA ? ?THERAPY DIAG:  ?Unsteadiness on feet ? ?Other abnormalities of gait and mobility ? ?Other lack of coordination ? ?PERTINENT HISTORY: Diplopia, basilar artery stenosis, PFO, cerebral infarction 08/2021, essential HTN ? ?PRECAUTIONS: Fall ? ?SUBJECTIVE: Pt reports he still hasn't done exercises at home yet, but plans to do so. Reports he walks around his house a lot with his walker.  Wife reports they are going on a cruise in May  ? ?PAIN:  ?Are you having pain? Yes: NPRS scale: 2/10 ?Pain location: Back on R side- where he fell ?Pain description: sore/tender  ?  ?TODAY'S TREATMENT: ? ?  TODAY'S SESSION  ?  Neuro Re-ed:     ?   Pt performed sit to stand 5 reps from mat without UE  support with CGA to stabilize ? ? Pt performed coordination/SLS activity by side of mat - touching balance bubbles (2) straight ahead with each foot 5 reps and then diagonal pattern with each foot 5 reps with LUE support on a chair prn with CGA to min/mod assist for recovery of LOB due to SLS deficits ? ?  Marching in place 10 reps beside counter for support prn ?  Pt performed alternating forward, back and side kicks 10 reps each at counter with UE support prn with CGA ?   ?  Pt performed tall kneeling activity on mat on floor (CGA for mat to floor transfer);  performed 10 small mini-squats in tall kneeling position ?  Performed moving each leg forward/back 5 reps  in tall kneeling position with UE support on mat prn ? ?  Performed quadruped position - crawled forward on mat for approx. 3' due to space constraint; performed hip extension 3 reps each leg with CGA to min assist for 5 sec hold; then performed "birddog" position - lifting opposite UE/LE with min assist 2 reps each side due to c/o fatigue  ? ? ?GAIT: ?Gait pattern: step through pattern, decreased arm swing- Right, decreased arm swing- Left, decreased step length- Right, decreased step length- Left, decreased stance time- Right, decreased hip/knee flexion- Right, decreased hip/knee flexion- Left, decreased ankle dorsiflexion- Right, Right hip hike, Right foot flat, scissoring, ataxic, wide BOS, narrow BOS, and poor foot clearance- Right ?Distance walked: 230'  ?Assistive device utilized: None ?Level of assistance: Min A and Mod A ?Comments: Pt ambulated 2 laps around gym, turning toward Lt side - had moderate LOB with 1 turn on curve of track, requiring mod assist for recovery  ? ?  ?PATIENT EDUCATION: ?Education details: Prioritized balance HEP to try to increase compliance - 3 directions of kicks alternating (forward, back and side);  marching; using object such as Solo cup (2) for targets to work on SLS on each leg and for improved coordination RLE ?Person educated: Patient and Wife Maurice Little)  ?Education method: Explanation and handout  ?Education comprehension: verbalized understanding and needs further education  ? ? ?HEP  ?Access Code: [email or web address] ?URL: [email or web address] ?Date: 02/05/2022 ?Prepared by: Maurice Little ? ?Exercises ?- Bird Dog  - 1 x daily - 7 x weekly - 3 sets - 10 reps ?- Tall to Half Kneeling w/RUE support on cane  - 1 x daily - 7 x weekly - 3 sets - 10 reps ?- Standing march with overhead reach  - 1 x daily - 7 x weekly - 3 sets - 10 reps ? ? ?GOALS ?  ? PT Short Term Goals - 01/11/22 1017   ? ?  ? PT SHORT TERM GOAL #1  ? Title Improve Berg score from 38/56 to >/= 41/56 to  decrease fall risk.   ? Baseline score 38/56 on 01-10-22; 42/56 on 02/08/22  ? Time 4   ? Period Weeks   ? Status MET   ? Target Date 02/08/22   ?  ? PT SHORT TERM GOAL #2  ? Title Improve TUG score from 21.84 secs with RW to </= 19 secs with RW with SBA only (no physical assist needed) to increase safety with ambulation.   ? Baseline 21.84 secs with RW with CGA to min assist - ataxia in RLE; 21.65s w/SBA and RW  ? Time 4   ? Period Weeks   ? Status NOT MET    ? Target Date 02/08/22   ?  ?  PT SHORT TERM GOAL #3  ? Title Pt will amb. 230' without device with CGA on flat, even surface for increased community accessibility.   ? Baseline --   ? Time 4   ? Period Weeks   ? Status NOT MET - 230' w/min A 2/2 scissoring of RLE   ? Target Date 02/08/22   ?  ? PT SHORT TERM GOAL #4  ? Title Pt will negotiate 4 steps with use of bil. hand rails with SBA   ? Baseline --   ? Time 4   ? Period Weeks   ? Status MET - step-through pattern    ? Target Date 02/08/22   ?  ? PT SHORT TERM GOAL #5  ? Title Independent in updated HEP for balance exercises and pt will report performing HEP on regular basis.   ? Baseline --   ? Time 4   ? Period Weeks   ? Status NOT MET - Pt not performing    ? Target Date 02/08/22   ?  ? PT SHORT TERM GOAL #6  ? Title --   ? Baseline --   ? Time --   ? Period --   ? Status --   ? Target Date --   ? ?  ?  ? ?  ? ? PT Long Term Goals - 01/10/22 1158   ? ?  ? PT LONG TERM GOAL #1  ? Title Pt will be modified independent with household amb. without use of assistive device.   ? Baseline Pt reports he is walking in the house with the RW independently   ? Time 8   ? Period Weeks   ? Status On-going   ? Target Date 03/08/22   ?  ? PT LONG TERM GOAL #2  ? Title Pt will amb. without device with CGA 500' for increased community accessibility.   ? Time 8   ? Period Weeks   ? Status Not Met   ? Target Date 03/08/22   ?  ? PT LONG TERM GOAL #3  ? Title Improve TUG score to </= 20 secs with appropriate assistive device to  demo improved functional mobility and to reduce fall risk.   ? Baseline 1".07 secs with RW - 10-16-21;  TUG with RW- 22.97 secs,   21.84 secs without RW - needs CGA for safety   ? Time 8   ? Period Weeks

## 2022-02-19 ENCOUNTER — Encounter: Payer: Self-pay | Admitting: Physical Therapy

## 2022-02-19 ENCOUNTER — Ambulatory Visit: Payer: 59 | Admitting: Physical Therapy

## 2022-02-19 DIAGNOSIS — R2681 Unsteadiness on feet: Secondary | ICD-10-CM

## 2022-02-19 DIAGNOSIS — R2689 Other abnormalities of gait and mobility: Secondary | ICD-10-CM

## 2022-02-19 DIAGNOSIS — R29818 Other symptoms and signs involving the nervous system: Secondary | ICD-10-CM

## 2022-02-19 DIAGNOSIS — R278 Other lack of coordination: Secondary | ICD-10-CM

## 2022-02-19 NOTE — Therapy (Signed)
?OUTPATIENT PHYSICAL THERAPY TREATMENT NOTE ? ? ?Patient Name: Maurice Little ?MRN: [id number] ?DOB:[birth year], 66 y.o., male ?Today's Date: 02/19/2022 ? ?PCP: de [place], Raymond J, MD ?REFERRING PROVIDER: Cathlyn Parsons, PA-C ? ? PT End of Session - 02/19/22 2156   ? ? Visit Number [id number]   ? Number of Visits 39   ? Date for PT Re-Evaluation 03/08/22   ? Authorization Type [employer] ([place] with no auth [place]   ? PT Start Time 1103   ? PT Stop Time 1191   ? PT Time Calculation (min) 39 min   ? Equipment Utilized During Treatment Other (comment)   single bar bells, aquatic step  ? Activity Tolerance Patient tolerated treatment well   ? Behavior During Therapy [facility] for tasks assessed/performed   ? ?  ?  ? ?  ? ? ? ? ?Past Medical History:  ?Diagnosis Date  ? Benign prostatic hyperplasia with elevated prostate specific antigen (PSA)   ? Hypertension   ? Iliac artery stenosis, left (HCC)   ? with claudication resolved with stent  ? PAD (peripheral artery disease) ([place])   ? Stroke [facility])   ? ?Past Surgical History:  ?Procedure Laterality Date  ? BUBBLE STUDY  08/31/2021  ? Procedure: BUBBLE STUDY;  Surgeon: Skeet Latch, MD;  Location: [place];  Service: Cardiovascular;;  ? ILIAC ARTERY STENT Left   ? IR ANGIO VERTEBRAL SEL SUBCLAVIAN INNOMINATE UNI L MOD SED  08/30/2021  ? IR ANGIO VERTEBRAL SEL SUBCLAVIAN INNOMINATE UNI R MOD SED  08/30/2021  ? IR CT HEAD LTD  08/30/2021  ? IR INTRA CRAN STENT  08/30/2021  ? IR RADIOLOGIST EVAL & MGMT  11/28/2021  ? IR RADIOLOGIST EVAL & MGMT  01/12/2022  ? IR US GUIDE VASC ACCESS RIGHT  08/30/2021  ? RADIOLOGY WITH ANESTHESIA N/A 08/30/2021  ? Procedure: IR WITH ANESTHESIA;  Surgeon: Luanne Bras, MD;  Location: [place];  Service: Radiology;  Laterality: N/A;  ? TEE WITHOUT CARDIOVERSION N/A 08/31/2021  ? Procedure: TRANSESOPHAGEAL ECHOCARDIOGRAM (TEE);  Surgeon: Skeet Latch, MD;  Location: [place];  Service: Cardiovascular;  Laterality: N/A;  ? ?Patient Active Problem List  ?  Diagnosis Date Noted  ? 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 Cough 01/31/2022  ? Familial hypercholesteremia 11/06/2021  ? PFO (patent foramen ovale) 11/06/2021  ? Urinary retention 09/27/2021  ? Diplopia 09/27/2021  ? Carotid stenosis, right 09/27/2021  ? Anemia 09/27/2021  ? Slow transit constipation   ? Benign essential HTN   ? Cerebellar stroke ([place]) 09/04/2021  ? Cerebral embolism with cerebral infarction 08/30/2021  ? 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 ? ?REFERRING DIAG: Cerebellar CVA ? ?THERAPY DIAG:  ?Unsteadiness on feet ? ?Other abnormalities of gait and mobility ? ?Other symptoms and signs involving the nervous system ? ?Other lack of coordination ? ?PERTINENT HISTORY: Diplopia, basilar artery stenosis, PFO, cerebral infarction 08/2021, essential HTN ? ?PRECAUTIONS: Fall ? ?SUBJECTIVE: No new complaints. No falls or pain to report.  ? ?PAIN:  ?Are you having pain? No ? ?  ?TODAY'S TREATMENT: ?02/19/2022: ?Aquatic therapy at [facility] - pool temp 92 degrees ? ?Patient seen for aquatic therapy today.  Treatment took place in water 3.5-4.5 feet deep depending upon activity.  Pt entered/exited pool via stairs with reciprocal pattern with min guard assist.  ? ?Supervision for gait from stairs into deeper end of water.  ?In ~  4.6-4.8 foot depth water with min guard to supervision for the following: ?Forward gait ?Backward gait ?Side stepping ? ?With single bar bells in ~4.5 to 4.8 water depth  ?Walking marching with punching ?Walking marching with contralateral hand to knee taps ? ?With aerobic step: ?In ~4.6 water depth away from wall ?Forward step ups with contralateral march ?Lateral stepping up<>down over step  ? ?In ~4.6 water depth at wall ?One foot on step/other on floor- alternating quick changes of foot positioning ? ?With single bar bells in ~4.6 water depth ?Alternating lateral  mini lunges with simultaneous shoulder abduction/adduction for 10 reps toward each side  ? ? ? ?Pt requires buoyancy of water for support for reduced fall risk with gait training and balance exercises with minimal UE support; exercises able to be performed safely in water without the risk of fall compared to those same exercises performed on land;  viscosity of water needed for resistance for strengthening.  Current of water provides perturbations for challenging static & dynamic standing balance.    ? ?  ?PATIENT EDUCATION: ?Education details: Prioritized balance HEP to try to increase compliance - 3 directions of kicks alternating (forward, back and side);  marching; using object such as Solo cup (2) for targets to work on SLS on each leg and for improved coordination RLE ?Person educated: Patient and Wife [name])  ?Education method: Explanation and handout  ?Education comprehension: verbalized understanding and needs further education  ? ? ?HEP: ?Access Code: [email or web address] ?URL: [email or web address] ?Date: 02/05/2022 ?Prepared by: Mickie Bail Plaster ? ?Exercises ?- Bird Dog  - 1 x daily - 7 x weekly - 3 sets - 10 reps ?- Tall to Half Kneeling w/RUE support on cane  - 1 x daily - 7 x weekly - 3 sets - 10 reps ?- Standing march with overhead reach  - 1 x daily - 7 x weekly - 3 sets - 10 reps ? ? ?GOALS: ?  ? PT Short Term Goals - 01/11/22 1017   ? ?  ? PT SHORT TERM GOAL #1  ? Title Improve Berg score from 38/56 to >/= 41/56 to decrease fall risk.   ? Baseline score 38/56 on 01-10-22; 42/56 on 02/08/22  ? Time 4   ? Period Weeks   ? Status MET   ? Target Date 02/08/22   ?  ? PT SHORT TERM GOAL #2  ? Title Improve TUG score from 21.84 secs with RW to </= 19 secs with RW with SBA only (no physical assist needed) to increase safety with ambulation.   ? Baseline 21.84 secs with RW with CGA to min assist - ataxia in RLE; 21.65s w/SBA and RW  ? Time 4   ? Period Weeks   ? Status NOT MET    ? Target Date 02/08/22   ?   ? PT SHORT TERM GOAL #3  ? Title Pt will amb. 230' without device with CGA on flat, even surface for increased community accessibility.   ? Baseline --   ? Time 4   ? Period Weeks   ? Status NOT MET - 230' w/min A 2/2 scissoring of RLE   ? Target Date 02/08/22   ?  ? PT SHORT TERM GOAL #4  ? Title Pt will negotiate 4 steps with use of bil. hand rails with SBA   ? Baseline --   ? Time 4   ? Period Weeks   ? Status MET - step-through pattern    ? Target Date  02/08/22   ?  ? PT SHORT TERM GOAL #5  ? Title Independent in updated HEP for balance exercises and pt will report performing HEP on regular basis.   ? Baseline --   ? Time 4   ? Period Weeks   ? Status NOT MET - Pt not performing    ? Target Date 02/08/22   ?  ? PT SHORT TERM GOAL #6  ? Title --   ? Baseline --   ? Time --   ? Period --   ? Status --   ? Target Date --   ? ?  ?  ? ?  ? ? PT Long Term Goals - 01/10/22 1158   ? ?  ? PT LONG TERM GOAL #1  ? Title Pt will be modified independent with household amb. without use of assistive device.   ? Baseline Pt reports he is walking in the house with the RW independently   ? Time 8   ? Period Weeks   ? Status On-going   ? Target Date 03/08/22   ?  ? PT LONG TERM GOAL #2  ? Title Pt will amb. without device with CGA 500' for increased community accessibility.   ? Time 8   ? Period Weeks   ? Status Not Met   ? Target Date 03/08/22   ?  ? PT LONG TERM GOAL #3  ? Title Improve TUG score to </= 20 secs with appropriate assistive device to demo improved functional mobility and to reduce fall risk.   ? Baseline 1".07 secs with RW - 10-16-21;  TUG with RW- 22.97 secs,   21.84 secs without RW - needs CGA for safety   ? Time 8   ? Period Weeks   ? Status New   ? Target Date 03/08/22   ?  ? PT LONG TERM GOAL #4  ? Title Increase Berg score to >/= 45/56 to reduce fall risk.   ? Baseline 27/56;     01-10-22 = 38/56   ? Time 8   ? Period Weeks   ? Status On-going   ? Target Date 03/08/22   ?  ? PT LONG TERM GOAL #5  ? Title  Increase gait velocity from .43 ft/sec to >/= 2.0 ft/sec with RW for increased gait efficiency.   ? Baseline 17.59 secs = 1.86 ft/sec with RW   ? Time 8   ? Period Weeks   ? Status New   ? Target Date 05/1

## 2022-02-20 ENCOUNTER — Encounter: Payer: Self-pay | Admitting: Internal Medicine

## 2022-02-21 ENCOUNTER — Ambulatory Visit: Payer: 59 | Admitting: Physical Therapy

## 2022-02-21 DIAGNOSIS — R2681 Unsteadiness on feet: Secondary | ICD-10-CM

## 2022-02-21 DIAGNOSIS — R2689 Other abnormalities of gait and mobility: Secondary | ICD-10-CM

## 2022-02-21 DIAGNOSIS — R278 Other lack of coordination: Secondary | ICD-10-CM

## 2022-02-21 NOTE — Therapy (Signed)
?OUTPATIENT PHYSICAL THERAPY TREATMENT NOTE ? ? ?Patient Name: Maurice Little ?MRN: [id number] ?DOB:05/04/1956, 66 y.o., male ?Today's Date: 02/21/2022 ? ?PCP: de Little, Maurice J, MD ?REFERRING PROVIDER: Cathlyn Parsons, PA-C ? ? PT End of Session - 02/21/22 1656   ? ? Visit Number [id number]   ? Number of Visits 39   ? Date for PT Re-Evaluation 03/08/22   ? Authorization Type [employer] ([place] with no auth required)   ? PT Start Time 1055   ? PT Stop Time 1145   ? PT Time Calculation (min) 50 min   ? Equipment Utilized During Treatment --   single bar bells, aquatic step  ? Activity Tolerance Patient tolerated treatment well   ? Behavior During Therapy [facility] for tasks assessed/performed   ? ?  ?  ? ?  ? ? ? ? ?Past Medical History:  ?Diagnosis Date  ? Benign prostatic hyperplasia with elevated prostate specific antigen (PSA)   ? Hypertension   ? Iliac artery stenosis, left (HCC)   ? with claudication resolved with stent  ? PAD (peripheral artery disease) ([place])   ? Stroke [facility])   ? ?Past Surgical History:  ?Procedure Laterality Date  ? BUBBLE STUDY  08/31/2021  ? Procedure: BUBBLE STUDY;  Surgeon: Maurice Latch, MD;  Location: [place];  Service: Cardiovascular;;  ? ILIAC ARTERY STENT Left   ? IR ANGIO VERTEBRAL SEL SUBCLAVIAN INNOMINATE UNI L MOD SED  08/30/2021  ? IR ANGIO VERTEBRAL SEL SUBCLAVIAN INNOMINATE UNI R MOD SED  08/30/2021  ? IR CT HEAD LTD  08/30/2021  ? IR INTRA CRAN STENT  08/30/2021  ? IR RADIOLOGIST EVAL & MGMT  11/28/2021  ? IR RADIOLOGIST EVAL & MGMT  01/12/2022  ? IR US GUIDE VASC ACCESS RIGHT  08/30/2021  ? RADIOLOGY WITH ANESTHESIA N/A 08/30/2021  ? Procedure: IR WITH ANESTHESIA;  Surgeon: Maurice Bras, MD;  Location: [place];  Service: Radiology;  Laterality: N/A;  ? TEE WITHOUT CARDIOVERSION N/A 08/31/2021  ? Procedure: TRANSESOPHAGEAL ECHOCARDIOGRAM (TEE);  Surgeon: Maurice Latch, MD;  Location: [place];  Service: Cardiovascular;  Laterality: N/A;  ? ?Patient Active Problem List  ? Diagnosis Date  Noted  ? 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 Cough 01/31/2022  ? Familial hypercholesteremia 11/06/2021  ? PFO (patent foramen ovale) 11/06/2021  ? Urinary retention 09/27/2021  ? Diplopia 09/27/2021  ? Carotid stenosis, right 09/27/2021  ? Anemia 09/27/2021  ? Slow transit constipation   ? Benign essential HTN   ? Cerebellar stroke ([place]) 09/04/2021  ? Cerebral embolism with cerebral infarction 08/30/2021  ? 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 ? ?REFERRING DIAG: Cerebellar CVA ? ?THERAPY DIAG:  ?Unsteadiness on feet ? ?Other abnormalities of gait and mobility ? ?Other lack of coordination ? ?PERTINENT HISTORY: Diplopia, basilar artery stenosis, PFO, cerebral infarction 08/2021, essential HTN ? ?PRECAUTIONS: Fall ? ?SUBJECTIVE: Wife reports pt has done his balance HEP for 5 days straight; says they have done exercises standing at counter, including marching with and without UE support and also says he has done mat exercises on the floor, including bird dog pose ? ?PAIN:  ?Are you having pain? No ?  ?TODAY'S TREATMENT: ? ?  TODAY'S SESSION  ?  Neuro Re-ed:     ?   Pt performed sit to stand 5 reps from mat without UE support with CGA - cues to attempt  with increased speed with sit to stand ? ? Pt performed coordination/SLS activity by side of mat - stepping forward/back 5 times with each leg with contralateral UE flexion for improved coordination; stepping out/in 10 reps with each leg with bil. UE abduction with stepping out and then adduction with stepping in; CGA to min assist for balance recovery as pt had difficulty with precise step length and location with RLE; improved after approx. 5 reps   ? ?Marching in place 10 reps by mat but with min assist intermittently for balance recovery ? ?Standing at steps:  pt performed tap ups with each foot 5 reps to 1st step, then alternating each foot 5  reps to 1st step; tap ups to 2nd step with RLE for improved coordination and motor control - with min to mod assist ?   ?  Pt performed standing on mini trampoline with RUE support on step rail and LUE support on mini trampoline; performed standing with EO with horizontal head turms 5 reps, vertical head turns 5 reps; marching (small amplitude movement) 10 reps each leg with CGA to min assist with UE support on bar ?Pt performed jumping 10 reps with UE support with min to CGA ? ?  Stepping 4 square "+" on floor with yardsticks placed on floor to make boxes - pt stepped over yardstick forward, then stepped Lt lateral, then backward and then stepped to Rt laterally - performed 3 reps with cues to keep feet separated for increased BOS ?   ?GAIT: ?Gait pattern: step through pattern, decreased arm swing- Right, decreased arm swing- Left, decreased step length- Right, decreased step length- Left, decreased stance time- Right, decreased hip/knee flexion- Right, decreased hip/knee flexion- Left, decreased ankle dorsiflexion- Right, Right hip hike, Right foot flat, scissoring, ataxic, wide BOS, narrow BOS, and poor foot clearance- Right ?Distance walked: 40' x 2 reps - from mat to steps then back to mat  ?Assistive device utilized: None ?Level of assistance: Min A and Mod A - increased assistance needed with fatigue ?Comments:  ? ?  ?PATIENT EDUCATION: ?Education details: No changes added to HEP on 02-21-22; did demonstrate 4 square stepping to pt's wife in case they want to practice this activity at home:   ?02-14-22:   Prioritized balance HEP to try to increase compliance - 3 directions of kicks alternating (forward, back and side);  marching; using object such as Solo cup (2) for targets to work on SLS on each leg and for improved coordination RLE ? ?Person educated: Patient and Wife Maurice Little)  ?Education method: Explanation and handout  ?Education comprehension: verbalized understanding and needs further education  ? ? ?HEP   ?Access Code: [email or web address] ?URL: [email or web address] ?Date: 02/05/2022 ?Prepared by: Mickie Bail Plaster ? ?Exercises ?- Bird Dog  - 1 x daily - 7 x weekly - 3 sets - 10 reps ?- Tall to Half Kneeling w/RUE support on cane  - 1 x daily - 7 x weekly - 3 sets - 10 reps ?- Standing march with overhead reach  - 1 x daily - 7 x weekly - 3 sets - 10 reps ? ? ?GOALS ?  ? PT Short Term Goals - 01/11/22 1017   ? ?  ? PT SHORT TERM GOAL #1  ? Title Improve Berg score from 38/56 to >/= 41/56 to decrease fall risk.   ? Baseline score 38/56 on 01-10-22; 42/56 on 02/08/22  ? Time 4   ? Period Weeks   ? Status MET   ? Target  Date 02/08/22   ?  ? PT SHORT TERM GOAL #2  ? Title Improve TUG score from 21.84 secs with RW to </= 19 secs with RW with SBA only (no physical assist needed) to increase safety with ambulation.   ? Baseline 21.84 secs with RW with CGA to min assist - ataxia in RLE; 21.65s w/SBA and RW  ? Time 4   ? Period Weeks   ? Status NOT MET    ? Target Date 02/08/22   ?  ? PT SHORT TERM GOAL #3  ? Title Pt will amb. 230' without device with CGA on flat, even surface for increased community accessibility.   ? Baseline --   ? Time 4   ? Period Weeks   ? Status NOT MET - 230' w/min A 2/2 scissoring of RLE   ? Target Date 02/08/22   ?  ? PT SHORT TERM GOAL #4  ? Title Pt will negotiate 4 steps with use of bil. hand rails with SBA   ? Baseline --   ? Time 4   ? Period Weeks   ? Status MET - step-through pattern    ? Target Date 02/08/22   ?  ? PT SHORT TERM GOAL #5  ? Title Independent in updated HEP for balance exercises and pt will report performing HEP on regular basis.   ? Baseline --   ? Time 4   ? Period Weeks   ? Status NOT MET - Pt not performing    ? Target Date 02/08/22   ?  ? PT SHORT TERM GOAL #6  ? Title --   ? Baseline --   ? Time --   ? Period --   ? Status --   ? Target Date --   ? ?  ?  ? ?  ? ? PT Long Term Goals - 01/10/22 1158   ? ?  ? PT LONG TERM GOAL #1  ? Title Pt will be modified independent  with household amb. without use of assistive device.   ? Baseline Pt reports he is walking in the house with the RW independently   ? Time 8   ? Period Weeks   ? Status On-going   ? Target Date 03/08/22

## 2022-02-25 ENCOUNTER — Ambulatory Visit: Payer: 59 | Admitting: Physical Therapy

## 2022-02-26 ENCOUNTER — Ambulatory Visit: Payer: 59 | Admitting: Adult Health

## 2022-02-26 ENCOUNTER — Ambulatory Visit: Payer: 59 | Admitting: Physical Therapy

## 2022-02-28 ENCOUNTER — Ambulatory Visit: Payer: 59 | Admitting: Adult Health

## 2022-02-28 ENCOUNTER — Encounter: Payer: Self-pay | Admitting: Adult Health

## 2022-02-28 ENCOUNTER — Ambulatory Visit: Payer: Self-pay | Admitting: Physical Therapy

## 2022-02-28 VITALS — BP 118/76 | HR 65 | Ht 72.0 in | Wt 230.0 lb

## 2022-02-28 DIAGNOSIS — I6350 Cerebral infarction due to unspecified occlusion or stenosis of unspecified cerebral artery: Secondary | ICD-10-CM

## 2022-02-28 DIAGNOSIS — I639 Cerebral infarction, unspecified: Secondary | ICD-10-CM

## 2022-02-28 NOTE — Patient Instructions (Addendum)
Continue to work with PT for hopeful ongoing recovery  ? ?Continue to follow with eye doctor and physical medicine and rehab  ? ?Continue aspirin 81 mg daily  and atorvastatin and Zetia for secondary stroke prevention ? ?Continue Brilinta per Dr. Fatima Sanger recommendations -Ensure follow-up in September as advised ? ?Continue to follow up with PCP regarding cholesterol and blood pressure management  ?Maintain strict control of hypertension with blood pressure goal below 130/90 and cholesterol with LDL cholesterol (bad cholesterol) goal below 70 mg/dL.  ? ?Signs of a Stroke? Follow the BEFAST method:  ?Balance Watch for a sudden loss of balance, trouble with coordination or vertigo ?Eyes Is there a sudden loss of vision in one or both eyes? Or double vision?  ?Face: Ask the person to smile. Does one side of the face droop or is it numb?  ?Arms: Ask the person to raise both arms. Does one arm drift downward? Is there weakness or numbness of a leg? ?Speech: Ask the person to repeat a simple phrase. Does the speech sound slurred/strange? Is the person confused ? ?Time: If you observe any of these signs, call 911. ? ? ? ? ?Followup in the future with me in 6 months or call earlier if needed ? ? ? ? ? ? ?Thank you for coming to see [place] at [facility]. I hope we have been able to provide you high quality care today. ? ?You may receive a patient satisfaction survey over the next few weeks. We would appreciate your feedback and comments so that we may continue to improve ourselves and the health of our patients. ? ?

## 2022-02-28 NOTE — Progress Notes (Signed)
?[facility] ?[address] ?[place]. [place] [address] ?[phone] ? ?     STROKE FOLLOW UP NOTE ? ?[name] ?Date of Birth:  10-06-1956 ?Medical Record Number:  [id number]  ? ?Reason for Referral: stroke follow up ? ? ? ?SUBJECTIVE: ? ? ?CHIEF COMPLAINT:  ?Chief Complaint  ?Patient presents with  ? Follow-up  ?  RM 3 with spouse Maurice Little ?Pt is well and stable, has been making improvement with ADLs.  no new concerns   ? ? ?HPI:  ? ?Update 02/28/2022 JM: Patient returns for 16-month stroke follow-up accompanied by his wife.  Overall stable without new stroke/TIA symptoms. Completed OT back in March. Continues working with PT with right-sided ataxia and imbalance with continued gradual improvement.  Ambulates with RW, no recent falls.  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  Continues to tape eye shut at night, use of eye patch with car rides, and lubricant for dry eye although has been improving.  Looking at getting fitted with hearing aide 5/19 with [facility]. Does not continue lightheadedness. Bladder and bowel functioning normally.  Able to do majority of ADLs with only minimal assistance/supervision and gradually doing some IADLs. ? ?Remains on aspirin, Brilinta, atorvastatin and Zetia, denies side effects. F/u with IR, repeat CTA 12/2021 showed stable appearance of VA and BA stent and improvement of b/l carotid stenosis, plans on repeat CTA in September.  Blood pressure today 118/76. Cardiology recently discontinued BP med, still on amlodipine and losartan. Completed cardiac monitor which was negative for A-fib.  No further concerns at this time. ? ? ? ? ? ?History provided for reference purposes only ?Initial visit 11/15/2021 JM: Maurice Little is being seen for initial hospital follow-up accompanied by his wife, Maurice Little. ? ?Doing outpatient PT/OT for past 6 weeks - making progress  ?Eye patch for car rides and at night (at first  difficulty closing all the way, denies this being an issue but does continue to use eye drops frequently for dry eye) ?Occasional diplopia especially when looking towards the right ?Complete Left side numbness - denies any weakness ?right facial numbness and hearing impairment with tinnitus ?Imbalance - improving.  Ambulates short distance with RW and w/c long distance. No recent falls ?No further urinary retention or incontinence.  Currently being treated for UTI ?No new stroke/TIA symptoms ? ?Compliant on aspirin and Brilinta as well as atorvastatin and Zetia without side effects.  Blood pressure today 121/79.  As scheduled follow-up with Dr. Estanislado Pandy next week.  Currently wearing cardiac monitor.  Has initial eval scheduled with [facility] next month.  Currently awaiting to schedule ophthalmology consult.  No further concerns at this time. ? ?Stroke admission 11-22 ?[name] is a 66 y.o. male with no known medical history who presented on 08/29/2021 with left-sided numbness, dizziness, headache diplopia.  Developed right facial droop, slurred speech and right hearing loss overnight.  Personally reviewed hospitalization pertinent progress notes, lab work and imaging.  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  CTA head/neck showed right VA occlusion, R PICA occlusion, mid BA high-grade stenosis vs thrombosis, right ICA 65% stenosis and L VA-PICA. IR s/p stenting of R VA and mid BA, severe 90% stenosis right ICA proximal and 50% stenosis left ICA proximal.  Repeat MRI 11/5 showed increased  size of right cerebellar infarct, new patchy right cerebellar infarcts in AICA territories, new infarcts in the right middle cerebral peduncle, right medulla, right and left pons and new punctate infarcts of left parietal occipital lobes. Episode of transient dizziness  and blurred vision 11/8 likley orthostatic with repeat imaging showing new density in the medulla of unclear significant - possibly artifact.  EF 60 to 65%.  TEE EF 60 to 65%, no evidence of LA/LAA thrombus or mass although positive for PFO.  Recommended 30-day cardiac event monitor outpatient to rule out A. fib.  LDL 220.  A1c 5.5.  Placed on aspirin and Brilinta s/p stent with plans on follow-up outpatient and potential right ICA stenting.  Unstable BP placed on Cleviprex drip and placed on amlodipine and hydralazine.  Placed on atorvastatin 80 mg daily.  Therapy eval's recommended CIR for ongoing therapy needs with residual right facial droop, diplopia, right lateral gaze incomplete, right hearing loss, left-sided numbness, imbalance with RLE incoordination, and urinary retention.  ? ? ? ? ? ? ? ? ?PERTINENT IMAGING ? ?Per hospitalization 08/30/2019 ?CT showed right cerebellar infarct ?CT head and neck right VA origin occlusion, reconstituted at distal V2.  Right PICA occlusion, mid basilar artery high-grade stenosis versus thrombosis, right ICA 65% stenosis, left VA ends at PICA ?MRI right cerebellar infarct, and right lower pontine infarct. ?IR right VA origin, mid to basilar artery prominent stenosis, status post stenting in both arteries.  Severe 90% stenosis right ICA proximal, 50% stenosis left ICA proximal. ?MRI repeat 11/5 demonstrates increased size of right cerebellar infarct, new patchy right cerebellar infarcts in AICA territors, new infarcts in right middle cerebellar peduncle, right medulla, right and left pons, new punctate infarcts in left parietal and occipital lobes. ?2D Echo EF 60 to 65% ?TEE performed 11/4, LVEF 60-65%No LA/LAAthrombus or mass.+PFO with R-->L shuntingAtherosclerosis of the descending aorta. ? ?LDL 220 ?HgbA1c 5.5 ? ? ? ?ROS:   ?14 system review of systems performed and negative with exception of those listed in HPI ? ?PMH:  ?Past Medical History:  ?Diagnosis Date  ? Benign  prostatic hyperplasia with elevated prostate specific antigen (PSA)   ? Hypertension   ? Iliac artery stenosis, left (HCC)   ? with claudication resolved with stent  ? PAD (peripheral artery disease) (HCC)   ? Stroke [facility])   ? ? ?PSH:  ?Past Surgical History:  ?Procedure Laterality Date  ? BUBBLE STUDY  08/31/2021  ? Procedure: BUBBLE STUDY;  Surgeon: Chilton Si, MD;  Location: [facility];  Service: Cardiovascular;;  ? ILIAC ARTERY STENT Left   ? IR ANGIO VERTEBRAL SEL SUBCLAVIAN INNOMINATE UNI L MOD SED  08/30/2021  ? IR ANGIO VERTEBRAL SEL SUBCLAVIAN INNOMINATE UNI R MOD SED  08/30/2021  ? IR CT HEAD LTD  08/30/2021  ? IR INTRA CRAN STENT  08/30/2021  ? IR RADIOLOGIST EVAL & MGMT  11/28/2021  ? IR RADIOLOGIST EVAL & MGMT  01/12/2022  ? IR US GUIDE VASC ACCESS RIGHT  08/30/2021  ? RADIOLOGY WITH ANESTHESIA N/A 08/30/2021  ? Procedure: IR WITH ANESTHESIA;  Surgeon: Julieanne Cotton, MD;  Location: [facility];  Service: Radiology;  Laterality: N/A;  ? TEE WITHOUT CARDIOVERSION N/A 08/31/2021  ? Procedure: TRANSESOPHAGEAL ECHOCARDIOGRAM (TEE);  Surgeon: Chilton Si, MD;  Location: [facility] ENDOSCOPY;  Service: Cardiovascular;  Laterality: N/A;  ? ? ?Social History:  ?Social History  ? ?Socioeconomic History  ? Marital status: Married  ?  Spouse name: [name]  ? Number of children: Not  on file  ? Years of education: Not on file  ? Highest education level: Bachelor's degree (e.g., BA, AB, BS)  ?Occupational History  ? Not on file  ?Tobacco Use  ? Smoking status: Former  ?  Types: Cigarettes  ? Smokeless tobacco: Never  ? Tobacco comments:  ?  Quit 2000  ?Vaping Use  ? Vaping Use: Never used  ?Substance and Sexual Activity  ? Alcohol use: Not Currently  ? Drug use: Never  ? Sexual activity: Not on file  ?Other Topics Concern  ? Not on file  ?Social History Narrative  ? Lives with wife  ? ?Social Determinants of Health  ? ?Financial Resource Strain: Not on file  ?Food Insecurity: Not on file  ?Transportation Needs: Not on  file  ?Physical Activity: Not on file  ?Stress: Not on file  ?Social Connections: Not on file  ?Intimate Partner Violence: Not on file  ? ? ?Family History:  ?Family History  ?Problem Relation Age of Onset  ?

## 2022-03-01 ENCOUNTER — Ambulatory Visit: Payer: 59 | Attending: Family Medicine | Admitting: Physical Therapy

## 2022-03-01 DIAGNOSIS — R2681 Unsteadiness on feet: Secondary | ICD-10-CM | POA: Diagnosis present

## 2022-03-01 DIAGNOSIS — R278 Other lack of coordination: Secondary | ICD-10-CM | POA: Insufficient documentation

## 2022-03-01 DIAGNOSIS — Z9181 History of falling: Secondary | ICD-10-CM | POA: Diagnosis present

## 2022-03-01 DIAGNOSIS — M6281 Muscle weakness (generalized): Secondary | ICD-10-CM | POA: Insufficient documentation

## 2022-03-01 DIAGNOSIS — R2689 Other abnormalities of gait and mobility: Secondary | ICD-10-CM | POA: Insufficient documentation

## 2022-03-01 NOTE — Therapy (Signed)
?OUTPATIENT PHYSICAL THERAPY TREATMENT NOTE- RECERT ? ? ?Patient Name: Maurice Little ?MRN: [id number] ?DOB:[birth year], 66 y.o., male ?Today's Date: 03/01/2022 ? ?PCP: de [place], Raymond J, MD ?REFERRING PROVIDER: Cathlyn Parsons, PA-C ? ? PT End of Session - 03/01/22 7425   ? ? Visit Number [id number]   ? Number of Visits 39   ? Date for PT Re-Evaluation 03/08/22   ? Authorization Type UHC ([place] with no auth required)   ? PT Start Time [phone]   ? Equipment Utilized During Treatment Gait belt   single bar bells, aquatic step  ? Activity Tolerance Patient tolerated treatment well   ? Behavior During Therapy [facility] for tasks assessed/performed   ? ?  ?  ? ?  ? ? ? ? ? ?Past Medical History:  ?Diagnosis Date  ? Benign prostatic hyperplasia with elevated prostate specific antigen (PSA)   ? Hypertension   ? Iliac artery stenosis, left (HCC)   ? with claudication resolved with stent  ? PAD (peripheral artery disease) ([place])   ? Stroke [facility])   ? ?Past Surgical History:  ?Procedure Laterality Date  ? BUBBLE STUDY  08/31/2021  ? Procedure: BUBBLE STUDY;  Surgeon: Skeet Latch, MD;  Location: [place];  Service: Cardiovascular;;  ? ILIAC ARTERY STENT Left   ? IR ANGIO VERTEBRAL SEL SUBCLAVIAN INNOMINATE UNI L MOD SED  08/30/2021  ? IR ANGIO VERTEBRAL SEL SUBCLAVIAN INNOMINATE UNI R MOD SED  08/30/2021  ? IR CT HEAD LTD  08/30/2021  ? IR INTRA CRAN STENT  08/30/2021  ? IR RADIOLOGIST EVAL & MGMT  11/28/2021  ? IR RADIOLOGIST EVAL & MGMT  01/12/2022  ? IR US GUIDE VASC ACCESS RIGHT  08/30/2021  ? RADIOLOGY WITH ANESTHESIA N/A 08/30/2021  ? Procedure: IR WITH ANESTHESIA;  Surgeon: Luanne Bras, MD;  Location: [place];  Service: Radiology;  Laterality: N/A;  ? TEE WITHOUT CARDIOVERSION N/A 08/31/2021  ? Procedure: TRANSESOPHAGEAL ECHOCARDIOGRAM (TEE);  Surgeon: Skeet Latch, MD;  Location: [place];  Service: Cardiovascular;  Laterality: N/A;  ? ?Patient Active Problem List  ? Diagnosis Date Noted  ? 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 Cough 01/31/2022  ? Familial hypercholesteremia 11/06/2021  ? PFO (patent foramen ovale) 11/06/2021  ? Urinary retention 09/27/2021  ? Diplopia 09/27/2021  ? Carotid stenosis, right 09/27/2021  ? Anemia 09/27/2021  ? Slow transit constipation   ? Benign essential HTN   ? Cerebellar stroke ([place]) 09/04/2021  ? Cerebral embolism with cerebral infarction 08/30/2021  ? 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 ? ?REFERRING DIAG: Cerebellar CVA ? ?THERAPY DIAG:  ?Unsteadiness on feet ? ?Muscle weakness (generalized) ? ?Other abnormalities of gait and mobility ? ?PERTINENT HISTORY: Diplopia, basilar artery stenosis, PFO, cerebral infarction 08/2021, essential HTN ? ?PRECAUTIONS: Fall ? ?SUBJECTIVE: Wife and pt report that pt has been performing his HEP consistently. No new falls or changes to report.  ? ?PAIN:  ?Are you having pain? No ? ?OBJECTIVE ?  ?TODAY'S TREATMENT: ?  Ther Act  ?  LTG Assessment  ?   ? [facility] PT Assessment - 03/01/22 0910   ? ?  ? Ambulation/Gait  ? Gait velocity 16.09s = 2.03 ft/s w/RW   ?  ? Berg Balance Test  ? Sit to Stand Able to stand without using hands and stabilize independently   ? Standing Unsupported Able to stand safely 2 minutes   ?  Sitting with Back Unsupported but Feet Supported on Floor or Stool Able to sit safely and securely 2 minutes   ? Stand to Sit Sits safely with minimal use of hands   ? Transfers Able to transfer safely, minor use of hands   ? Standing Unsupported with Eyes Closed Able to stand 10 seconds safely   ? Standing Unsupported with Feet Together Able to place feet together independently and stand 1 minute safely   ? From Standing, Reach Forward with Outstretched Arm Can reach confidently >25 cm (10")   ? From Standing Position, Pick up Object from [place] to pick up shoe safely and easily   ? From Standing Position, Turn to Look Behind  Over each Shoulder Looks behind from both sides and weight shifts well   ? Turn 360 Degrees Able to turn 360 degrees safely but slowly   ? Standing Unsupported, Alternately Place Feet on Step/Stool Able to complete >2 steps/needs minimal assist   ? Standing Unsupported, One Foot in Front Able to plae foot ahead of the other independently and hold 30 seconds   ? Standing on One Leg Tries to lift leg/unable to hold 3 seconds but remains standing independently   ? Total Score 47   ? Berg comment: moderate fall risk   ?  ? Timed Up and Go Test  ? Normal TUG (seconds) 18.3   w/RW and SBA  ? ?  ?  ? ?  ? ? Gait Training  ?Gait pattern: step through pattern, decreased arm swing- Right, decreased arm swing- Left, decreased step length- Right, decreased stride length, decreased hip/knee flexion- Right, ataxic, lateral hip instability, wide BOS, narrow BOS, and abducted- Right ?Distance walked: 40'  ?Assistive device utilized: None ?Level of assistance: Min A ?Comments: Pt ambulated initial 115' w/CGA and noted over pronation of R foot and maintained weight on heel. As pt fatigued, required min A due to ataxia of RLE, varying gait speed and inconsistency w/narrow and wide BOS especially around turns. Pt and wife inquired about use of a brace or orthotic to control p's R ankle and reduce over pronation w/weightbearing. Discussed use of a wedge or AFO to bias neutral heel position but hesitant to add bracing at this time due to pt's ability to maintain neutral ankle position if he concentrates on it.  ? ?  ?PATIENT EDUCATION: ?Education details: Discussed potential to reassess gait w/rollator and encouraged pt to begin ambulating for longer distances or using a stationary bike/stepper for improved stamina, as his balance declines w/fatigue. Encouraged pt try single leg stance at counter w/chair behind him for improved single leg stability, as this is where he lost the most points on Berg (did not provide handout due to time  constraints). Encouraged pt to use gym on cruise ship (NuStep or stationary bike) for endurance training.  ? ?Person educated: Patient and Wife [name])  ?Education method: Explanation  ?Education comprehension: verbalized understanding and needs further education  ? ? ?HEP  ?Access Code: [email or web address] ?URL: [email or web address] ?Date: 02/05/2022 ?Prepared by: Mickie Bail Lanijah Warzecha ? ?Exercises ?- Bird Dog  - 1 x daily - 7 x weekly - 3 sets - 10 reps ?- Tall to Half Kneeling w/RUE support on cane  - 1 x daily - 7 x weekly - 3 sets - 10 reps ?- Standing march with overhead reach  - 1 x daily - 7 x weekly - 3 sets - 10 reps ? ? ?GOALS ?  ? PT Short Term Goals -  01/11/22 1017   ? ?  ? PT SHORT TERM GOAL #1  ? Title Improve Berg score from 38/56 to >/= 41/56 to decrease fall risk.   ? Baseline score 38/56 on 01-10-22; 42/56 on 02/08/22  ? Time 4   ? Period Weeks   ? Status MET   ? Target Date 02/08/22   ?  ? PT SHORT TERM GOAL #2  ? Title Improve TUG score from 21.84 secs with RW to </= 19 secs with RW with SBA only (no physical assist needed) to increase safety with ambulation.   ? Baseline 21.84 secs with RW with CGA to min assist - ataxia in RLE; 21.65s w/SBA and RW  ? Time 4   ? Period Weeks   ? Status NOT MET    ? Target Date 02/08/22   ?  ? PT SHORT TERM GOAL #3  ? Title Pt will amb. 230' without device with CGA on flat, even surface for increased community accessibility.   ? Baseline --   ? Time 4   ? Period Weeks   ? Status NOT MET - 230' w/min A 2/2 scissoring of RLE   ? Target Date 02/08/22   ?  ? PT SHORT TERM GOAL #4  ? Title Pt will negotiate 4 steps with use of bil. hand rails with SBA   ? Baseline --   ? Time 4   ? Period Weeks   ? Status MET - step-through pattern    ? Target Date 02/08/22   ?  ? PT SHORT TERM GOAL #5  ? Title Independent in updated HEP for balance exercises and pt will report performing HEP on regular basis.   ? Baseline --   ? Time 4   ? Period Weeks   ? Status NOT MET - Pt not  performing    ? Target Date 02/08/22   ?  ? PT SHORT TERM GOAL #6  ? Title --   ? Baseline --   ? Time --   ? Period --   ? Status --   ? Target Date --   ? ?  ?  ? ?  ? ? PT Long Term Goals - 03/08/22 1158   ? ?  ? PT

## 2022-03-12 ENCOUNTER — Ambulatory Visit: Payer: 59 | Admitting: Physical Therapy

## 2022-03-12 ENCOUNTER — Encounter: Payer: Self-pay | Admitting: Physical Therapy

## 2022-03-12 DIAGNOSIS — R2689 Other abnormalities of gait and mobility: Secondary | ICD-10-CM

## 2022-03-12 DIAGNOSIS — R2681 Unsteadiness on feet: Secondary | ICD-10-CM | POA: Diagnosis not present

## 2022-03-12 DIAGNOSIS — R278 Other lack of coordination: Secondary | ICD-10-CM

## 2022-03-12 DIAGNOSIS — M6281 Muscle weakness (generalized): Secondary | ICD-10-CM

## 2022-03-12 NOTE — Therapy (Signed)
?OUTPATIENT PHYSICAL THERAPY TREATMENT NOTE- RECERT ? ? ?Patient Name: Maurice Little ?MRN: [id number] ?DOB:September 30, 1956, 66 y.o., male ?Today's Date: 03/12/2022 ? ?PCP: [place], Raymond J, MD ?REFERRING PROVIDER: Charlton Amor, PA-C ? ? PT End of Session - 03/12/22 2328   ? ? Visit Number 37   ? Number of Visits 39   ? Date for PT Re-Evaluation 03/08/22   ? Authorization Type [employer]HC ([place] auth [place]   ? PT Start Time 1016   ? PT Stop Time 1055   ? PT Time Calculation (min) 39 min   ? Equipment Utilized During Treatment Other (comment)   single bar bells, aquatic step  ? Activity Tolerance Patient tolerated treatment well   ? Behavior During Therapy [facility] for tasks assessed/performed   ? ?  ?  ? ?  ? ? ? ? ? ?Past Medical History:  ?Diagnosis Date  ? Benign prostatic hyperplasia with elevated prostate specific antigen (PSA)   ? Hypertension   ? Iliac artery stenosis, left (HCC)   ? with claudication resolved with stent  ? PAD (peripheral artery disease) (HCC)   ? Stroke [facility])   ? ?Past Surgical History:  ?Procedure Laterality Date  ? BUBBLE STUDY  08/31/2021  ? Procedure: BUBBLE STUDY;  Surgeon: Chilton Si, MD;  Location: [facility] ENDOSCOPY;  Service: Cardiovascular;;  ? ILIAC ARTERY STENT Left   ? IR ANGIO VERTEBRAL SEL SUBCLAVIAN INNOMINATE UNI L MOD SED  08/30/2021  ? IR ANGIO VERTEBRAL SEL SUBCLAVIAN INNOMINATE UNI R MOD SED  08/30/2021  ? IR CT HEAD LTD  08/30/2021  ? IR INTRA CRAN STENT  08/30/2021  ? IR RADIOLOGIST EVAL & MGMT  11/28/2021  ? IR RADIOLOGIST EVAL & MGMT  01/12/2022  ? IR US GUIDE VASC ACCESS RIGHT  08/30/2021  ? RADIOLOGY WITH ANESTHESIA N/A 08/30/2021  ? Procedure: IR WITH ANESTHESIA;  Surgeon: Julieanne Cotton, MD;  Location: [facility] OR;  Service: Radiology;  Laterality: N/A;  ? TEE WITHOUT CARDIOVERSION N/A 08/31/2021  ? Procedure: TRANSESOPHAGEAL ECHOCARDIOGRAM (TEE);  Surgeon: Chilton Si, MD;  Location: [facility] ENDOSCOPY;  Service: Cardiovascular;  Laterality: N/A;  ? ?Patient Active Problem  List  ? Diagnosis Date Noted  ? 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 Cough 01/31/2022  ? Familial hypercholesteremia 11/06/2021  ? PFO (patent foramen ovale) 11/06/2021  ? Urinary retention 09/27/2021  ? Diplopia 09/27/2021  ? Carotid stenosis, right 09/27/2021  ? Anemia 09/27/2021  ? Slow transit constipation   ? Benign essential HTN   ? 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  ? Cerebral embolism with cerebral infarction 08/30/2021  ? 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 ? ?REFERRING DIAG: Cerebellar CVA ? ?THERAPY DIAG:  ?Unsteadiness on feet ? ?Muscle weakness (generalized) ? ?Other abnormalities of gait and mobility ? ?Other lack of coordination ? ?PERTINENT HISTORY: Diplopia, basilar artery stenosis, PFO, cerebral infarction 08/2021, essential HTN ? ?PRECAUTIONS: Fall ? ?SUBJECTIVE: . No new falls or changes to report. Had a good time on the cruise. ? ?PAIN:  ?Are you having pain? No ? ?OBJECTIVE ?  ?TODAY'S TREATMENT: ?  03/12/2022 ?Aquatic therapy at [facility] - pool temp 92 degrees ? ?Patient seen for aquatic therapy today.  Treatment took place in water 3.5-4.5 feet deep depending upon activity.  Pt entered/exited pool via stairs with bil rails, step to pattern with min guard assist. ? ?Supervision for gait from stairs  to deeper end of pool, ~4.5-4.8 foot dept water. Supervision to min guard assist for the following: ?Forward gait for 18 feet across pool with emphasis on large steps and arm swing for 8 laps ?Backward gait for 18 feet across pool with emphasis on tall posture, step length and arm swing for 8 laps ?Side stepping 18 feet across pool with emphasis on posture, not rotating and step length for 5 laps toward each side, up to min guard assist at times with side stepping for balance assistance.  ? ?In ~4.5 to 4.8 foot depth water with single bar bells. Cues needed for form and  technique.  ?Side stepping left<>right while moving arms out<>in for 18 feet across pool for 3 laps toward each side ?Forward walking mini lunges while moving arms out<>in at water level for 18 feet  across pool x 6 laps, min guard to min assist for balance.  ?Forward walking high knee marching tapping contralateral hands for 18 feet across pool x 6 laps with min guard assistance.  ? ? ?In ~4.5 foot water depth with aquatic step ?Forward step ups with contralateral marching for 15 reps each leg with UE support on wall/HHA from PTA ?Lateral step ups with hip side kick out<>in water for 15 reps each side with UE support at wall ? ?Supervision for gait from deep end back to stairs. ? ? ? ?Pt requires buoyancy of water for support for reduced fall risk with gait training and balance exercises with minimal UE support; exercises able to be performed safely in water without the risk of fall compared to those same exercises performed on land;  viscosity of water needed for resistance for strengthening.  Current of water provides perturbations for challenging static & dynamic standing balance.    ? ?  ?PATIENT EDUCATION: ?Education details: continue with current HEP ?Person educated: Patient and Wife [name](Jodi)  ?Education method: Explanation  ?Education comprehension: verbalized understanding and needs further education  ? ? ?HEP  ?Access Code: [email or web address] ?URL: [email or web address] ?Date: 02/05/2022 ?Prepared by: Alethia BertholdJannah Plaster ? ?Exercises ?- Bird Dog  - 1 x daily - 7 x weekly - 3 sets - 10 reps ?- Tall to Half Kneeling w/RUE support on cane  - 1 x daily - 7 x weekly - 3 sets - 10 reps ?- Standing march with overhead reach  - 1 x daily - 7 x weekly - 3 sets - 10 reps ? ?GOALS ?  ? ?NEW LONG TERM GOALS FOR UPDATED POC:  Target date: 04/16/2022 ? ?Pt will amb. 500' w/LRAD and S* for increased community accessibility.  ?Baseline: 460' no AD w/min guard  ?Goal status: INITIAL ? ?2.  Pt will be modified independent with  household amb. without use of assistive device.  ?Baseline: S*-CGA from wife, Lennox LaityJodi  ?Goal status: INITIAL ? ?3.  Pt will improve gait velocity to at least 2.4 ft/s with LRAD mod I for improved gait efficiency and independence ? ?Baseline: 2.03 ft/s w/RW on 03/01/22  ?Goal status: INITIAL ? ?4.  Pt will improve normal TUG to less than or equal to 15 seconds w/LRAD mod I for improved functional mobility and decreased fall risk. ? ?Baseline: 18.3s w/RW and S*  ?Goal status: INITIAL ? ?5.  Pt will be independent with final HEP and walking program for improved strength, balance, transfers and gait. ? ?Baseline:  ?Goal status: INITIAL ? ?6.  Pt will demonstrate single leg stance of >/= 5s per side without UE support for improved single leg stability,  balance and ankle strategy  ?Baseline: <1s per side  ?Goal status: INITIAL ? ?ASSESSMENT  ? Plan - 01/11/22 1045   ? ? Clinical Impression Statement Today's skilled session continued to focus on gait, balance and strengthening in the aquatic setting with no issues noted or reported. The pt is making steady progress and should benefit from continued PT to progress toward unmet goals.   ? Personal Factors and Comorbidities Comorbidity 2;Profession   ? Comorbidities basilar artery stenosis, dyslipidemia,  HTN, neurogenic bladder, diplopia, anemia, Lt iliac artery stenosis, PAD   ? Examination-Activity Limitations Bathing;Carry;Continence;Toileting;Dressing;Lift;Stand;Stairs;Squat;Reach Overhead;Locomotion Level;Transfers   ? Examination-Participation Restrictions Cleaning;Community Activity;Driving;Interpersonal Relationship;Laundry;Shop;Occupation;Yard Work;Meal Prep   ? Stability/Clinical Decision Making Evolving/Moderate complexity   ? Rehab Potential Good   ? PT Frequency 2x / week   ? PT Duration 6 weeks (recert)   ? PT Treatment/Interventions ADLs/Self Care Home Management;Aquatic Therapy;DME Instruction;Gait training;Stair training;Therapeutic activities;Therapeutic  exercise;Balance training;Neuromuscular re-education;Patient/family education;Vestibular   ? PT Next Visit Plan LAND =  Cont with gait without RW, trunk/core stabilization exercises and standing balance exs., c

## 2022-03-14 ENCOUNTER — Ambulatory Visit: Payer: 59 | Admitting: Physical Therapy

## 2022-03-14 DIAGNOSIS — R2681 Unsteadiness on feet: Secondary | ICD-10-CM

## 2022-03-14 DIAGNOSIS — Z9181 History of falling: Secondary | ICD-10-CM

## 2022-03-14 DIAGNOSIS — R2689 Other abnormalities of gait and mobility: Secondary | ICD-10-CM

## 2022-03-14 NOTE — Therapy (Signed)
OUTPATIENT PHYSICAL THERAPY TREATMENT NOTE   Patient Name: Maurice Little MRN: [id number] DOB:1956-07-16, 66 y.o., male Today's Date: 03/14/2022  PCP: Tennis Must [place], Blondell Reveal, MD REFERRING PROVIDER: Cathlyn Parsons, PA-C   PT End of Session - 03/14/22 0804     Visit Number 31    Number of Visits 32    Date for PT Re-Evaluation 23/30/07   Recert   Authorization Type UHC ([place] with no auth required)    PT Start Time 0801    PT Stop Time 0846    PT Time Calculation (min) 45 min    Equipment Utilized During Treatment Gait belt   single bar bells, aquatic step   Activity Tolerance Patient tolerated treatment well    Behavior During Therapy WFL for tasks assessed/performed                 Past Medical History:  Diagnosis Date   Benign prostatic hyperplasia with elevated prostate specific antigen (PSA)    Hypertension    Iliac artery stenosis, left (HCC)    with claudication resolved with stent   PAD (peripheral artery disease) ([place])    Stroke [facility])    Past Surgical History:  Procedure Laterality Date   BUBBLE STUDY  08/31/2021   Procedure: BUBBLE STUDY;  Surgeon: Skeet Latch, MD;  Location: [place];  Service: Cardiovascular;;   ILIAC ARTERY STENT Left    IR ANGIO VERTEBRAL SEL SUBCLAVIAN INNOMINATE UNI L MOD SED  08/30/2021   IR ANGIO VERTEBRAL SEL SUBCLAVIAN INNOMINATE UNI R MOD SED  08/30/2021   IR CT HEAD LTD  08/30/2021   IR INTRA CRAN STENT  08/30/2021   IR RADIOLOGIST EVAL & MGMT  11/28/2021   IR RADIOLOGIST EVAL & MGMT  01/12/2022   IR US GUIDE VASC ACCESS RIGHT  08/30/2021   RADIOLOGY WITH ANESTHESIA N/A 08/30/2021   Procedure: IR WITH ANESTHESIA;  Surgeon: Luanne Bras, MD;  Location: [place];  Service: Radiology;  Laterality: N/A;   TEE WITHOUT CARDIOVERSION N/A 08/31/2021   Procedure: TRANSESOPHAGEAL ECHOCARDIOGRAM (TEE);  Surgeon: Skeet Latch, MD;  Location: [facility] ENDOSCOPY;  Service: Cardiovascular;  Laterality: N/A;   Patient Active Problem  List   Diagnosis Date Noted   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   Cough 01/31/2022   Familial hypercholesteremia 11/06/2021   PFO (patent foramen ovale) 11/06/2021   Urinary retention 09/27/2021   Diplopia 09/27/2021   Carotid stenosis, right 09/27/2021   Anemia 09/27/2021   Slow transit constipation    Benign essential HTN    Cerebellar stroke ([place]) 09/04/2021   Cerebral embolism with cerebral infarction 08/30/2021   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 No falls on cruise. Reports he walked laps in the pool on ship for a few days.   PAIN:  Are you having pain? No  OBJECTIVE   TODAY'S TREATMENT:   Gait Training  Gait pattern: step through pattern, decreased arm swing- Right, decreased arm swing- Left, decreased step length- Right, decreased stride length, decreased hip/knee flexion- Right, ataxic, lateral hip instability, wide BOS, narrow BOS, and abducted- Right Distance walked:  345'  Assistive device utilized: None Level of assistance: Min A Comments: Pt ambulated first 115' w/CGA, noted wide BOS, good foot placement, maintained cadence and progression towards bilateral arm swing. Pt fatigued very quickly after initial lap and began to regress to asymmetrical step placement, variable cadence, rigid sagittal plane rotation and scissoring of RLE, requiring min A to prevent fall and mod verbal cues to focus on foot placement and reduce impulsive stepping. Lengthy discussion regarding importance of improving stamina w/exercising at  home, as pt's balance directly affected w/fatigue.   NMR  Soccer ball kicks using 2 therapists for anticipatory stepping, single leg stability, proper foot placement, LE coordination and maintaining amplitude of kick. On first attempt, pt kicked ball w/too much amplitude and lost balance due to improper foot placement, requiring max A to recover. Max verbal cues to slow down, maintain good foot placement and maintain consistency w/amplitude. Pt then able to perform 3x5 kicks per side w/min guard for steadying assist. Noted good anticipatory kick, foot placement and maintained amplitude of kick throughout. Pt w/tendency to place RLE slightly forward and has significant difficulty w/posterior stepping.   Ther Ex  SciFit multi-peaks level 3 for 9 minutes using BUE/BLEs for dynamic cardiovascular endurance, neural priming for reciprocal movement and interval training. Total of 679 steps. RPE of 2/10 following exercise.     PATIENT EDUCATION: Education details: Continue to encourage pt to work on stamina at home via stationary bike, walking program or pool.  Person educated: Patient and Wife [name])  Education method: Explanation  Education comprehension: verbalized understanding and needs further education    HEP  Access Code: [email or web address] URL: [email or web address] Date: 02/05/2022 Prepared by: Mickie Bail Shinita Mac  Exercises - Bird Dog  - 1 x daily - 7 x weekly - 3 sets - 10 reps - Tall to Half Kneeling w/RUE support on cane  - 1 x daily - 7 x weekly - 3 sets - 10 reps - Standing march with overhead reach  - 1 x daily - 7 x weekly - 3 sets - 10 reps   GOALS    PT Short Term Goals - 01/11/22 1017       PT SHORT TERM GOAL #1   Title Improve Berg score from 38/56 to >/= 41/56 to decrease fall risk.    Baseline score 38/56 on 01-10-22; 42/56 on 02/08/22   Time 4    Period Weeks    Status MET    Target Date 02/08/22      PT SHORT TERM GOAL #2   Title Improve TUG score from 21.84  secs with RW to </= 19 secs with RW with SBA only (no physical assist needed) to increase safety with ambulation.    Baseline 21.84 secs with RW with CGA to min assist - ataxia in RLE; 21.65s w/SBA and RW   Time 4    Period Weeks    Status NOT MET     Target Date 02/08/22      PT SHORT TERM GOAL #3   Title Pt will amb. 230' without device with CGA on flat, even surface for increased community accessibility.    Baseline --    Time 4    Period Weeks    Status NOT MET - 230' w/min A 2/2 scissoring of RLE    Target Date 02/08/22      PT SHORT TERM GOAL #4   Title Pt will negotiate 4 steps with use of bil. hand rails with SBA  Baseline --    Time 4    Period Weeks    Status MET - step-through pattern     Target Date 02/08/22      PT SHORT TERM GOAL #5   Title Independent in updated HEP for balance exercises and pt will report performing HEP on regular basis.    Baseline --    Time 4    Period Weeks    Status NOT MET - Pt not performing     Target Date 02/08/22      PT SHORT TERM GOAL #6   Title --    Baseline --    Time --    Period --    Status --    Target Date --             PT Long Term Goals - 03/08/22 1158       PT LONG TERM GOAL #1   Title Pt will be modified independent with household amb. without use of assistive device.    Baseline Wife reports pt walks around house and in driveway occasionally without AD and use of ait belt w/S*    Time 8    Period Weeks    Status PROGRESSING   Target Date 03/08/22      PT LONG TERM GOAL #2   Title Pt will amb. without device with CGA 500' for increased community accessibility.    Time 8    Period Weeks    Status PROGRESSING - 68' w/min A and no AD    Target Date 03/08/22      PT LONG TERM GOAL #3   Title Improve TUG score to </= 20 secs with appropriate assistive device to demo improved functional mobility and to reduce fall risk.    Baseline 1".07 secs with RW - 10-16-21;  TUG with RW- 22.97 secs,   21.84  secs without RW - needs CGA for safety; 18.3s w/RW   Time 8    Period Weeks    Status MET    Target Date 03/08/22      PT LONG TERM GOAL #4   Title Increase Berg score to >/= 45/56 to reduce fall risk.    Baseline 27/56;     01-10-22 = 38/56; 47/56 on 5/5   Time 8    Period Weeks    Status MET   Target Date 03/08/22      PT LONG TERM GOAL #5   Title Increase gait velocity from .43 ft/sec to >/= 2.0 ft/sec with RW for increased gait efficiency.    Baseline 17.59 secs = 1.86 ft/sec with RW; 2.03 ft/s w/RW    Time 8    Period Weeks    Status MET     Target Date 03/08/22      Additional Long Term Goals   Additional Long Term Goals Yes      PT LONG TERM GOAL #6   Title Independent in updated HEP as appropriate.    Baseline met 01-10-22    Time 12    Period Weeks    Status Achieved    Target Date 01/11/22      PT LONG TERM GOAL #7   Title Improve TUG score to </= 17 secs without RW with SBA for increased safety with gait.    Baseline 21.84 secs without RW with min assist    Time 8    Period Weeks    Status Dc'd DUE TO REPEAT GOAL    Target  Date 03/08/22             NEW LONG TERM GOALS FOR UPDATED POC:  Target date: 04/18/2022  Pt will amb. 500' w/LRAD and S* for increased community accessibility.  Baseline: 61' no AD w/min guard  Goal status: INITIAL  2.  Pt will be modified independent with household amb. without use of assistive device.  Baseline: S*-CGA from wife, [name]  Goal status: INITIAL  3.  Pt will improve gait velocity to at least 2.4 ft/s with LRAD mod I for improved gait efficiency and independence  Baseline: 2.03 ft/s w/RW on 03/01/22  Goal status: INITIAL  4.  Pt will improve normal TUG to less than or equal to 15 seconds w/LRAD mod I for improved functional mobility and decreased fall risk.  Baseline: 18.3s w/RW and S*  Goal status: INITIAL  5.  Pt will be independent with final HEP and walking program for improved strength, balance, transfers  and gait.  Baseline:  Goal status: INITIAL  6.  Pt will demonstrate single leg stance of >/= 5s per side without UE support for improved single leg stability, balance and ankle strategy  Baseline: <1s per side  Goal status: INITIAL  ASSESSMENT   Plan - 01/11/22 1045     Clinical Impression Statement Emphasis of skilled PT session on endurance, anticipatory stepping and consistency w/foot placement during gait. Pt continues to be limited by poor endurance and impulsive stepping, requiring mod-max A for safety with gait without AD and during interventions. Continue to strongly encourage pt to work on stamina at home for improved balance and functional mobility. Continue POC.    Personal Factors and Comorbidities Comorbidity 2;Profession    Comorbidities basilar artery stenosis, dyslipidemia,  HTN, neurogenic bladder, diplopia, anemia, Lt iliac artery stenosis, PAD    Examination-Activity Limitations Bathing;Carry;Continence;Toileting;Dressing;Lift;Stand;Stairs;Squat;Reach Overhead;Locomotion Level;Transfers    Examination-Participation Restrictions Cleaning;Community Activity;Driving;Interpersonal Relationship;Laundry;Shop;Occupation;Yard Work;Meal Prep    Stability/Clinical Decision Making Evolving/Moderate complexity    Rehab Potential Good    PT Frequency 2x / week    PT Duration 6 weeks (recert)    PT Treatment/Interventions ADLs/Self Care Home Management;Aquatic Therapy;DME Instruction;Gait training;Stair training;Therapeutic activities;Therapeutic exercise;Balance training;Neuromuscular re-education;Patient/family education;Vestibular    PT Next Visit Plan LAND =  Cont with gait without RW, trunk/core stabilization exercises and standing balance exs., coordination exercises esp for RLE. Ladder drills, obstacle courses, rebounder, quadruped > tall kneel, SciFit for stamina   POOL = swimming laps.  gait without UE support especially backwards, balance with narrow BOS, SLS, step ups,  orientation to midline without vision.    PT Home Exercise Plan [email or web address]    Consulted and Agree with Plan of Care Patient;Family member/caregiver    Family Member Consulted wife, Anise Salvo Iris Hairston, PT, DPT 03/14/22    9:02 AM

## 2022-03-15 ENCOUNTER — Other Ambulatory Visit: Payer: Self-pay | Admitting: Physical Medicine & Rehabilitation

## 2022-03-19 ENCOUNTER — Encounter: Payer: Self-pay | Admitting: Physical Therapy

## 2022-03-19 ENCOUNTER — Ambulatory Visit: Payer: 59 | Admitting: Physical Therapy

## 2022-03-19 ENCOUNTER — Encounter (HOSPITAL_BASED_OUTPATIENT_CLINIC_OR_DEPARTMENT_OTHER): Payer: Self-pay | Admitting: Family Medicine

## 2022-03-19 DIAGNOSIS — M6281 Muscle weakness (generalized): Secondary | ICD-10-CM

## 2022-03-19 DIAGNOSIS — R2689 Other abnormalities of gait and mobility: Secondary | ICD-10-CM

## 2022-03-19 DIAGNOSIS — R2681 Unsteadiness on feet: Secondary | ICD-10-CM

## 2022-03-19 DIAGNOSIS — Z9181 History of falling: Secondary | ICD-10-CM

## 2022-03-19 NOTE — Therapy (Unsigned)
OUTPATIENT PHYSICAL THERAPY TREATMENT NOTE   Patient Name: Maurice Little MRN: [id number] DOB:January 10, 1956, 66 y.o., male Today's Date: 03/19/2022  PCP: [place], Buren Kos, MD REFERRING PROVIDER: Charlton Amor, PA-C   PT End of Session - 03/19/22 1750     Visit Number 39    Number of Visits 51    Date for PT Re-Evaluation 04/12/22   Recert   Authorization Type [employer]HC ([place] with no auth required)    PT Start Time 0931    PT Stop Time 1015    PT Time Calculation (min) 44 min    Equipment Utilized During Treatment Gait belt   single bar bells, aquatic step   Activity Tolerance Patient tolerated treatment well    Behavior During Therapy WFL for tasks assessed/performed                 Past Medical History:  Diagnosis Date   Benign prostatic hyperplasia with elevated prostate specific antigen (PSA)    Hypertension    Iliac artery stenosis, left (HCC)    with claudication resolved with stent   PAD (peripheral artery disease) (HCC)    Stroke [facility])    Past Surgical History:  Procedure Laterality Date   BUBBLE STUDY  08/31/2021   Procedure: BUBBLE STUDY;  Surgeon: Chilton Si, MD;  Location: [facility] ENDOSCOPY;  Service: Cardiovascular;;   ILIAC ARTERY STENT Left    IR ANGIO VERTEBRAL SEL SUBCLAVIAN INNOMINATE UNI L MOD SED  08/30/2021   IR ANGIO VERTEBRAL SEL SUBCLAVIAN INNOMINATE UNI R MOD SED  08/30/2021   IR CT HEAD LTD  08/30/2021   IR INTRA CRAN STENT  08/30/2021   IR RADIOLOGIST EVAL & MGMT  11/28/2021   IR RADIOLOGIST EVAL & MGMT  01/12/2022   IR US GUIDE VASC ACCESS RIGHT  08/30/2021   RADIOLOGY WITH ANESTHESIA N/A 08/30/2021   Procedure: IR WITH ANESTHESIA;  Surgeon: Julieanne Cotton, MD;  Location: [facility] OR;  Service: Radiology;  Laterality: N/A;   TEE WITHOUT CARDIOVERSION N/A 08/31/2021   Procedure: TRANSESOPHAGEAL ECHOCARDIOGRAM (TEE);  Surgeon: Chilton Si, MD;  Location[facility] ENDOSCOPY;  Service: Cardiovascular;  Laterality: N/A;   Patient Active Problem  List   Diagnosis Date Noted   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   Cough 01/31/2022   Familial hypercholesteremia 11/06/2021   PFO (patent foramen ovale) 11/06/2021   Urinary retention 09/27/2021   Diplopia 09/27/2021   Carotid stenosis, right 09/27/2021   Anemia 09/27/2021   Slow transit constipation    Benign essential HTN    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   Cerebral embolism with cerebral infarction 08/30/2021   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 No falls or pain to report. Working on getting their pool open, got stairs for it, just need to get them anchored/stabilized.    PAIN:  Are you having pain? No    TODAY'S TREATMENT:   03/19/22 Aquatic therapy at [facility] - pool temp 92 degrees  Gait  Fwd Bwd Side stepping  With single bar bells Static balance with feet hip width apart Horz abd/add Shoulder add/abd Cross country [employer]  balance Walkgin fwd luges with arm movemt Walking high knee marching with contralateral hand tap Walking forward/backward with alteranting punching arms   Swimming breast stroke for 3 laps deep<>shallow end of pool      Pt requires buoyancy of water for support for reduced fall risk with gait training and balance exercises with minimal UE support; exercises able to be performed safely in water without the risk of fall compared to those same exercises performed on land;  viscosity of water needed for resistance for strengthening.  Current of water provides perturbations for  challenging static & dynamic standing balance.       PATIENT EDUCATION: Education details: Continue to encourage pt to work on stamina at home via stationary bike, walking program or pool.  Person educated: Patient and Wife [name](Jodi)  Education method: Explanation  Education comprehension: verbalized understanding and needs further education    HEP  Access Code: [email or web address] URL: [email or web address] Date: 02/05/2022 Prepared by: Alethia BertholdJannah Plaster  Exercises - Bird Dog  - 1 x daily - 7 x weekly - 3 sets - 10 reps - Tall to Half Kneeling w/RUE support on cane  - 1 x daily - 7 x weekly - 3 sets - 10 reps - Standing march with overhead reach  - 1 x daily - 7 x weekly - 3 sets - 10 reps   GOALS   NEW LONG TERM GOALS FOR UPDATED POC:  Target date: 04/23/2022  Pt will amb. 500' w/LRAD and S* for increased community accessibility.  Baseline: 460' no AD w/min guard  Goal status: INITIAL  2.  Pt will be modified independent with household amb. without use of assistive device.  Baseline: S*-CGA from wife, Lennox LaityJodi  Goal status: INITIAL  3.  Pt will improve gait velocity to at least 2.4 ft/s with LRAD mod I for improved gait efficiency and independence  Baseline: 2.03 ft/s w/RW on 03/01/22  Goal status: INITIAL  4.  Pt will improve normal TUG to less than or equal to 15 seconds w/LRAD mod I for improved functional mobility and decreased fall risk.  Baseline: 18.3s w/RW and S*  Goal status: INITIAL  5.  Pt will be independent with final HEP and walking program for improved strength, balance, transfers and gait.  Baseline:  Goal status: INITIAL  6.  Pt will demonstrate single leg stance of >/= 5s per side without UE support for improved single leg stability, balance and ankle strategy  Baseline: <1s per side  Goal status: INITIAL  ASSESSMENT   Plan - 01/11/22 1045     Clinical Impression Statement Skilled session continued to focus on strengthening, gait, and balance in the  aquatic setting with no issues noted or reported in session. The pt was able to tolerate increased activity and able to swim 3 laps in session today. The pt is progressing and should benefit from continued PT to progress toward unmet goals.    Personal Factors and Comorbidities Comorbidity 2;Profession    Comorbidities basilar artery stenosis, dyslipidemia,  HTN, neurogenic bladder, diplopia, anemia, Lt iliac artery stenosis, PAD    Examination-Activity Limitations Bathing;Carry;Continence;Toileting;Dressing;Lift;Stand;Stairs;Squat;Reach Overhead;Locomotion Level;Transfers    Examination-Participation Restrictions Cleaning;Community Activity;Driving;Interpersonal Relationship;Laundry;Shop;Occupation;Yard Work;Meal Prep    Stability/Clinical Decision Making Evolving/Moderate complexity    Rehab Potential Good    PT Frequency 2x / week    PT Duration 6 weeks (recert)    PT Treatment/Interventions ADLs/Self Care Home Management;Aquatic Therapy;DME Instruction;Gait training;Stair training;Therapeutic activities;Therapeutic exercise;Balance training;Neuromuscular re-education;Patient/family education;Vestibular    PT Next Visit Plan LAND =  Cont with gait without RW, trunk/core stabilization exercises and standing balance exs., coordination exercises esp for RLE. Ladder drills, obstacle courses, rebounder, quadruped > tall kneel, SciFit for stamina   POOL = swimming laps.  gait without UE support especially backwards, balance with narrow BOS, SLS, step ups, orientation to midline without vision.    PT Home Exercise Plan [email or web address]    Consulted and Agree with Plan of Care Patient;Family member/caregiver    Family Member Consulted wife, Ulyess Mort, [place], [facility] [address] [place], [place] [address] [phone] 03/19/22, 5:51 PM

## 2022-03-20 ENCOUNTER — Other Ambulatory Visit: Payer: Self-pay

## 2022-03-20 ENCOUNTER — Encounter: Payer: Self-pay | Admitting: Adult Health

## 2022-03-20 MED ORDER — TICAGRELOR 90 MG PO TABS: 90.0000 mg | ORAL_TABLET | Freq: Two times a day (BID) | ORAL | 0 refills | Status: DC

## 2022-03-20 NOTE — Telephone Encounter (Signed)
30 day's supplied until clarified by Janett Billow that she is managing.

## 2022-03-21 ENCOUNTER — Ambulatory Visit: Payer: 59 | Admitting: Physical Therapy

## 2022-03-21 DIAGNOSIS — R278 Other lack of coordination: Secondary | ICD-10-CM

## 2022-03-21 DIAGNOSIS — Z0271 Encounter for disability determination: Secondary | ICD-10-CM

## 2022-03-21 DIAGNOSIS — R2681 Unsteadiness on feet: Secondary | ICD-10-CM | POA: Diagnosis not present

## 2022-03-21 DIAGNOSIS — R2689 Other abnormalities of gait and mobility: Secondary | ICD-10-CM

## 2022-03-21 NOTE — Therapy (Signed)
OUTPATIENT PHYSICAL THERAPY TREATMENT NOTE   Patient Name: Maurice Little MRN: [id number] DOB:03/16/1956, 66 y.o., male Today's Date: 03/22/2022  PCP: [place], Blondell Reveal, MD REFERRING PROVIDER: Cathlyn Parsons, PA-C   PT End of Session - 03/22/22 1120     Visit Number 40    Number of Visits 1    Date for PT Re-Evaluation 09/62/83   Recert   Authorization Type UHC ([place] with no auth required)    PT Start Time 6629    PT Stop Time 1530    PT Time Calculation (min) 43 min    Equipment Utilized During Treatment Gait belt   single bar bells, aquatic step   Activity Tolerance Patient tolerated treatment well    Behavior During Therapy WFL for tasks assessed/performed                  Past Medical History:  Diagnosis Date   Benign prostatic hyperplasia with elevated prostate specific antigen (PSA)    Hypertension    Iliac artery stenosis, left (HCC)    with claudication resolved with stent   PAD (peripheral artery disease) ([place])    Stroke [place])    Past Surgical History:  Procedure Laterality Date   BUBBLE STUDY  08/31/2021   Procedure: BUBBLE STUDY;  Surgeon: Skeet Latch, MD;  Location: [facility];  Service: Cardiovascular;;   ILIAC ARTERY STENT Left    IR ANGIO VERTEBRAL SEL SUBCLAVIAN INNOMINATE UNI L MOD SED  08/30/2021   IR ANGIO VERTEBRAL SEL SUBCLAVIAN INNOMINATE UNI R MOD SED  08/30/2021   IR CT HEAD LTD  08/30/2021   IR INTRA CRAN STENT  08/30/2021   IR RADIOLOGIST EVAL & MGMT  11/28/2021   IR RADIOLOGIST EVAL & MGMT  01/12/2022   IR US GUIDE VASC ACCESS RIGHT  08/30/2021   RADIOLOGY WITH ANESTHESIA N/A 08/30/2021   Procedure: IR WITH ANESTHESIA;  Surgeon: Luanne Bras, MD;  Location: [facility];  Service: Radiology;  Laterality: N/A;   TEE WITHOUT CARDIOVERSION N/A 08/31/2021   Procedure: TRANSESOPHAGEAL ECHOCARDIOGRAM (TEE);  Surgeon: Skeet Latch, MD;  Location: [facility] ENDOSCOPY;  Service: Cardiovascular;  Laterality: N/A;   Patient Active Problem  List   Diagnosis Date Noted   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   Cough 01/31/2022   Familial hypercholesteremia 11/06/2021   PFO (patent foramen ovale) 11/06/2021   Urinary retention 09/27/2021   Diplopia 09/27/2021   Carotid stenosis, right 09/27/2021   Anemia 09/27/2021   Slow transit constipation    Benign essential HTN    Cerebellar stroke ([place]) 09/04/2021   Cerebral embolism with cerebral infarction 08/30/2021   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  Pt reports doing some standing balance exercises at home  PAIN:  Are you having pain? No  OBJECTIVE    TODAY'S TREATMENT:  03-21-22   Gait Training  Gait pattern: step through pattern, decreased arm swing- Right, decreased arm swing- Left,  decreased step length- Right, decreased stride length, decreased hip/knee flexion- Right, ataxic, lateral hip instability, wide BOS, narrow BOS, and abducted- Right Distance walked: 115' Assistive device utilized: None Level of assistance: Min A to mod assist - especially with LOB during negotiation of curve around track; also gait training was performed at end of session so pt was fatigued Comments:    NeuroRe-ed:  Pt performed tall kneeling activity on mat on floor; bil. UE support used initially, progressing to no UE support;  Pt performed lifting each UE 5 reps separately without UE  support; then lifting both arms up slowly 5 reps with CGA for balance Pt performed small squats in tall kneeling position 10 reps with UE support prn for balance recovery  Pt performed hip abduction in tall kneeling 10 reps each leg with 1 UE support; hip extension in tall kneeling with each leg 10 reps with 1 UE support; pt unable to perform either exercise without UE support on mat (1 UE support needed for balance)  1/2 kneeling position with min assist to position each foot correctly for knee over ankle - UE support needed for balance  Pt transferred from tall kneeling to standing with CGA then turned to sit on mat with min assist for stability during turn  Pt performed sit to stand without UE support with SBA - performs transfer slowly  Pt performed standing balance exercise to improve SLS and coordination on each leg - touching 3 balance bubbles with each foot 5 reps to each one with min to mod assist with pt having more ataxia with RLE movement but less balance with LLE touching target due to decreased RLE SLS   PATIENT EDUCATION: Education details: Prioritized standing exercises at home to work on standing balance;  standing exs at counter, STRONGLY ENCOURAGED TALL KNEELING AT [place],  and cont. With walking program Person educated: Patient and Wife [name])  Education method: Explanation  Education comprehension: verbalized understanding and needs further education    HEP  Access Code: [email or web address] URL: [email or web address] Date: 02/05/2022 Prepared by: Mickie Bail Plaster  Exercises - Bird Dog  - 1 x daily - 7 x weekly - 3 sets - 10 reps - Tall to Half Kneeling w/RUE support on cane  - 1 x daily - 7 x weekly - 3 sets - 10 reps - Standing march with overhead reach  - 1 x daily - 7 x weekly - 3 sets - 10 reps   GOALS    PT Short Term Goals - 01/11/22 1017       PT SHORT TERM GOAL #1   Title Improve Berg score from 38/56 to >/= 41/56 to decrease  fall risk.    Baseline score 38/56 on 01-10-22; 42/56 on 02/08/22   Time 4    Period Weeks    Status MET    Target Date 02/08/22      PT SHORT TERM GOAL #2   Title Improve TUG score from 21.84 secs with RW to </= 19 secs with RW with SBA only (no physical assist needed) to increase safety with ambulation.    Baseline 21.84 secs with RW with CGA to min assist - ataxia in RLE; 21.65s w/SBA and RW   Time 4    Period Weeks    Status NOT MET     Target Date 02/08/22      PT SHORT TERM GOAL #3   Title Pt will amb. 230' without device with CGA on flat, even surface  for increased community accessibility.    Baseline --    Time 4    Period Weeks    Status NOT MET - 230' w/min A 2/2 scissoring of RLE    Target Date 02/08/22      PT SHORT TERM GOAL #4   Title Pt will negotiate 4 steps with use of bil. hand rails with SBA    Baseline --    Time 4    Period Weeks    Status MET - step-through pattern     Target Date 02/08/22      PT SHORT TERM GOAL #5   Title Independent in updated HEP for balance exercises and pt will report performing HEP on regular basis.    Baseline --    Time 4    Period Weeks    Status NOT MET - Pt not performing     Target Date 02/08/22      PT SHORT TERM GOAL #6   Title --    Baseline --    Time --    Period --    Status --    Target Date --             PT Long Term Goals - 03/08/22 1158       PT LONG TERM GOAL #1   Title Pt will be modified independent with household amb. without use of assistive device.    Baseline Wife reports pt walks around house and in driveway occasionally without AD and use of ait belt w/S*    Time 8    Period Weeks    Status PROGRESSING   Target Date 03/08/22      PT LONG TERM GOAL #2   Title Pt will amb. without device with CGA 500' for increased community accessibility.    Time 8    Period Weeks    Status PROGRESSING - 67' w/min A and no AD    Target Date 03/08/22      PT LONG TERM GOAL #3   Title Improve TUG  score to </= 20 secs with appropriate assistive device to demo improved functional mobility and to reduce fall risk.    Baseline 1".07 secs with RW - 10-16-21;  TUG with RW- 22.97 secs,   21.84 secs without RW - needs CGA for safety; 18.3s w/RW   Time 8    Period Weeks    Status MET    Target Date 03/08/22      PT LONG TERM GOAL #4   Title Increase Berg score to >/= 45/56 to reduce fall risk.    Baseline 27/56;     01-10-22 = 38/56; 47/56 on 5/5   Time 8    Period Weeks    Status MET   Target Date 03/08/22      PT LONG TERM GOAL #5   Title Increase gait velocity from .43 ft/sec to >/= 2.0 ft/sec with RW for increased gait efficiency.    Baseline 17.59 secs = 1.86 ft/sec with RW; 2.03 ft/s w/RW    Time 8    Period Weeks    Status MET     Target Date 03/08/22      Additional Long Term Goals   Additional Long Term Goals Yes      PT LONG TERM GOAL #6   Title Independent in updated HEP as appropriate.    Baseline met 01-10-22    Time 12    Period Weeks    Status Achieved  Target Date 01/11/22      PT LONG TERM GOAL #7   Title Improve TUG score to </= 17 secs without RW with SBA for increased safety with gait.    Baseline 21.84 secs without RW with min assist    Time 8    Period Weeks    Status Dc'd DUE TO REPEAT GOAL    Target Date 03/08/22           NEW LONG TERM GOALS FOR UPDATED POC:  Target date: 04/26/2022  Pt will amb. 500' w/LRAD and S* for increased community accessibility.  Baseline: 26' no AD w/min guard  Goal status: INITIAL  2.  Pt will be modified independent with household amb. without use of assistive device.  Baseline: S*-CGA from wife, [name]  Goal status: INITIAL  3.  Pt will improve gait velocity to at least 2.4 ft/s with LRAD mod I for improved gait efficiency and independence  Baseline: 2.03 ft/s w/RW on 03/01/22  Goal status: INITIAL  4.  Pt will improve normal TUG to less than or equal to 17 seconds without RW with SBA for improved  functional mobility and decreased fall risk.  Baseline:  21.84 secs without RW with min assist  Goal status: INITIAL  5.  Pt will be independent with final HEP and walking program for improved strength, balance, transfers and gait.  Baseline:  Goal status: INITIAL  6.  Pt will demonstrate single leg stance of >/= 5s per side without UE support for improved single leg stability, balance and ankle strategy  Baseline: <1s per side  Goal status: INITIAL  ASSESSMENT   Plan - 01/11/22 1045     Clinical Impression Statement PT session focused on core stabilization and trunk stability, balance and coordination exercise with each LE.  Pt continues to fatigue very quickly with tall kneeling and 1/2 kneeling exercises on floor.  Pt unable to perform hip extension or abduction I tall kneeling on either leg without UE support on mat due to LOB.  Ataxia in RLE continues and increases with fatigue.  Cont with POC.    Personal Factors and Comorbidities Comorbidity 2;Profession    Comorbidities basilar artery stenosis, dyslipidemia,  HTN, neurogenic bladder, diplopia, anemia, Lt iliac artery stenosis, PAD    Examination-Activity Limitations Bathing;Carry;Continence;Toileting;Dressing;Lift;Stand;Stairs;Squat;Reach Overhead;Locomotion Level;Transfers    Examination-Participation Restrictions Cleaning;Community Activity;Driving;Interpersonal Relationship;Laundry;Shop;Occupation;Yard Work;Meal Prep    Stability/Clinical Decision Making Evolving/Moderate complexity    Rehab Potential Good    PT Frequency 2x / week    PT Duration 6 weeks (recert)    PT Treatment/Interventions ADLs/Self Care Home Management;Aquatic Therapy;DME Instruction;Gait training;Stair training;Therapeutic activities;Therapeutic exercise;Balance training;Neuromuscular re-education;Patient/family education;Vestibular    PT Next Visit Plan LAND =  Cont with gait without RW, trunk/core stabilization exercises and standing balance exs.,  coordination exercises esp for RLE. Ladder drills, obstacle courses, rebounder, quadruped > tall kneel, SciFit for stamina   POOL = swimming laps.  gait without UE support especially backwards, balance with narrow BOS, SLS, step ups, orientation to midline without vision.    PT Home Exercise Plan [email or web address]    Consulted and Agree with Plan of Care Patient;Family member/caregiver    Family Member Consulted wife, Judyann Munson BZMCEY, [place] 03/22/22    11:24 AM

## 2022-03-22 ENCOUNTER — Encounter: Payer: Self-pay | Admitting: Physical Therapy

## 2022-03-26 ENCOUNTER — Ambulatory Visit: Payer: 59 | Admitting: Physical Therapy

## 2022-03-26 ENCOUNTER — Encounter: Payer: Self-pay | Admitting: Physical Therapy

## 2022-03-26 DIAGNOSIS — R2681 Unsteadiness on feet: Secondary | ICD-10-CM

## 2022-03-26 DIAGNOSIS — M6281 Muscle weakness (generalized): Secondary | ICD-10-CM

## 2022-03-26 DIAGNOSIS — R2689 Other abnormalities of gait and mobility: Secondary | ICD-10-CM

## 2022-03-26 NOTE — Therapy (Signed)
OUTPATIENT PHYSICAL THERAPY TREATMENT NOTE   Patient Name: Maurice Little MRN: [id number] DOB:05/18/1956, 66 y.o., male Today's Date: 03/26/2022  PCP: Tommi Rumpsde [place], Buren Kosaymond J, MD REFERRING PROVIDER: Charlton AmorAngiulli, Daniel J, PA-C   PT End of Session - 03/26/22 2045     Visit Number 41    Number of Visits 51    Date for PT Re-Evaluation 04/12/22   Recert   Authorization Type UHC ([place] with no auth required)    PT Start Time 1101    PT Stop Time 1145    PT Time Calculation (min) 44 min    Equipment Utilized During Treatment --   single bar bells, aquatic step   Activity Tolerance Patient tolerated treatment well    Behavior During Therapy WFL for tasks assessed/performed                  Past Medical History:  Diagnosis Date   Benign prostatic hyperplasia with elevated prostate specific antigen (PSA)    Hypertension    Iliac artery stenosis, left (HCC)    with claudication resolved with stent   PAD (peripheral artery disease) (HCC)    Stroke [facility](HCC)    Past Surgical History:  Procedure Laterality Date   BUBBLE STUDY  08/31/2021   Procedure: BUBBLE STUDY;  Surgeon: Chilton Siandolph, Tiffany, MD;  [facility];  Service: Cardiovascular;;   ILIAC ARTERY STENT Left    IR ANGIO VERTEBRAL SEL SUBCLAVIAN INNOMINATE UNI L MOD SED  08/30/2021   IR ANGIO VERTEBRAL SEL SUBCLAVIAN INNOMINATE UNI R MOD SED  08/30/2021   IR CT HEAD LTD  08/30/2021   IR INTRA CRAN STENT  08/30/2021   IR RADIOLOGIST EVAL & MGMT  11/28/2021   IR RADIOLOGIST EVAL & MGMT  01/12/2022   IR US GUIDE VASC ACCESS RIGHT  08/30/2021   RADIOLOGY WITH ANESTHESIA N/A 08/30/2021   Procedure: IR WITH ANESTHESIA;  Surgeon: Julieanne Cottoneveshwar, Sanjeev, MD;  Location: [facility] OR;  Service: Radiology;  Laterality: N/A;   TEE WITHOUT CARDIOVERSION N/A 08/31/2021   Procedure: TRANSESOPHAGEAL ECHOCARDIOGRAM (TEE);  Surgeon: Chilton Siandolph, Tiffany, MD;  [facility];  Service: Cardiovascular;  Laterality: N/A;   Patient Active Problem List    Diagnosis Date Noted   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   Cough 01/31/2022   Familial hypercholesteremia 11/06/2021   PFO (patent foramen ovale) 11/06/2021   Urinary retention 09/27/2021   Diplopia 09/27/2021   Carotid stenosis, right 09/27/2021   Anemia 09/27/2021   Slow transit constipation    Benign essential HTN    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   Cerebral embolism with cerebral infarction 08/30/2021   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 No falls. Has not been able to get into his pool due to weather and still needing to get stairs secured.   PAIN:  Are you having pain? No     TODAY'S TREATMENT:   03/26/22 Aquatic therapy at [facility] - pool temp 94 degrees  Patient seen for aquatic therapy today.  Treatment took place in water 3.5-4.5 feet deep depending upon activity.  Pt entered/exited pool via stairs with step to gait pattern with bil rails and  min guard assist for safety. Min assist with bil HHA for gait on deck wheelchair<>stairs.   Supervision to min guard assist for gait from stairs to deeper end of pool, ~3.3 to 3.5 foot depth water Forward gait for 18 feet across pool with emphasis on large steps and arm swing for 8 laps Backward gait for 18 feet across pool with emphasis on tall posture, step length and arm swing for 8 laps Side stepping 18 feet across pool with emphasis on posture, not rotating and step length for 4 laps toward each side, up to min guard assist at times with side stepping for balance assistance.   Supervision to min guard  assist for gait from deeper end of pool to bench in water.  Min assist to position into tall kneeling with knees being on aquatic step/UE support on bench. Assist needed to keep LE's on step with all activites performed, along with cues for technique and form.  - mini squats - moving knee forward/backward - alternating UE raises - alternating moving LE out<>in    Swimming breast stroke for 3 laps deep<>shallow end of pool with distant supervision for safety.    In ~4.5 to 4.8 foot depth water with single bar bells. Cues needed for form and technique.  Dyanmic balance Forward walking mini lunges while moving arms out<>in at water level for 18 feet  across pool x 6 laps, min guard to min assist for balance.  Forward walking high knee marching tapping contralateral hands for 18 feet across pool x 6 laps with min guard assistance.      Pt requires buoyancy of water for support for reduced fall risk with gait training and balance exercises with minimal UE support; exercises able to be performed safely in water without the risk of fall compared to those same exercises performed on land;  viscosity of water needed for resistance for strengthening.  Current of water provides perturbations for challenging static & dynamic standing balance.         PATIENT EDUCATION: Education details: Prioritized standing exercises at home to work on standing balance;  standing exs at counter, STRONGLY ENCOURAGED TALL KNEELING AT HOME FOR CORE STABILIZATION,  and cont.With walking program Person educated: Patient and Wife Maurice Little)  Education method: Explanation  Education comprehension: verbalized understanding and needs further education    HOME EXERCISE PROGRAM Access Code: [email or web address] URL: [email or web address] Date: 02/05/2022 Prepared by: Alethia Berthold Plaster  Exercises - Bird Dog  - 1 x daily - 7 x weekly - 3 sets - 10 reps - Tall to Half Kneeling w/RUE support on cane  - 1 x daily - 7 x weekly  - 3 sets - 10 reps - Standing march with overhead reach  - 1 x daily - 7 x weekly - 3 sets - 10 reps   GOALS NEW LONG TERM GOALS FOR UPDATED POC:  Target date: 04/30/2022  Pt will amb. 500' w/LRAD and S* for increased community accessibility.  Baseline: 460' no AD w/min guard  Goal status: INITIAL  2.  Pt will be modified independent with household amb. without use of assistive device.  Baseline: S*-CGA from wife, Maurice Little  Goal status: INITIAL  3.  Pt will improve gait velocity to at least 2.4 ft/s with LRAD mod I for improved gait efficiency and independence  Baseline: 2.03 ft/s w/RW on 03/01/22  Goal status: INITIAL  4.  Pt will improve normal TUG to less than or equal to 17 seconds without RW with SBA for improved  functional mobility and decreased fall risk.  Baseline:  21.84 secs without RW with min assist  Goal status: INITIAL  5.  Pt will be independent with final HEP and walking program for improved strength, balance, transfers and gait.  Baseline:  Goal status: INITIAL  6.  Pt will demonstrate single leg stance of >/= 5s per side without UE support for improved single leg stability, balance and ankle strategy  Baseline: <1s per side  Goal status: INITIAL  ASSESSMENT    Plan - 01/11/22 1045     Clinical Impression Statement Today's skilled session continued to focus on gait, balance, coordination and activity tolerance in the aquatic setting with rest breaks taken as needed. Assistance needed to keep LE's on aquatic bench with tall kneeling and up to min assist with dynamic gait/balance activities.    Personal Factors and Comorbidities Comorbidity 2;Profession    Comorbidities basilar artery stenosis, dyslipidemia,  HTN, neurogenic bladder, diplopia, anemia, Lt iliac artery stenosis, PAD    Examination-Activity Limitations Bathing;Carry;Continence;Toileting;Dressing;Lift;Stand;Stairs;Squat;Reach Overhead;Locomotion Level;Transfers    Examination-Participation Restrictions  Cleaning;Community Activity;Driving;Interpersonal Relationship;Laundry;Shop;Occupation;Yard Work;Meal Prep    Stability/Clinical Decision Making Evolving/Moderate complexity    Rehab Potential Good    PT Frequency 2x / week    PT Duration 6 weeks (recert)    PT Treatment/Interventions ADLs/Self Care Home Management;Aquatic Therapy;DME Instruction;Gait training;Stair training;Therapeutic activities;Therapeutic exercise;Balance training;Neuromuscular re-education;Patient/family education;Vestibular    PT Next Visit Plan LAND =  Cont with gait without RW, trunk/core stabilization exercises and standing balance exs., coordination exercises esp for RLE. Ladder drills, obstacle courses, rebounder, quadruped > tall kneel, SciFit for stamina   POOL = swimming laps.  gait without UE support especially backwards, balance with narrow BOS, SLS, step ups, orientation to midline without vision, tall kneeling on step   PT Home Exercise Plan [email or web address]    Consulted and Agree with Plan of Care Patient;Family member/caregiver    Family Member Consulted wife, Ulyess Mort, [place], [facility] [address] [place], [place] [address] [phone] 03/26/22, 8:48 PM

## 2022-03-26 NOTE — Telephone Encounter (Signed)
This medication is managed by IR Dr. Corliss Skains - patient will need to contact him for ongoing refills. Thank you.

## 2022-03-28 ENCOUNTER — Ambulatory Visit: Payer: 59 | Admitting: Physical Therapy

## 2022-03-28 ENCOUNTER — Ambulatory Visit: Payer: 59 | Attending: Family Medicine | Admitting: Physical Therapy

## 2022-03-28 DIAGNOSIS — Z9181 History of falling: Secondary | ICD-10-CM | POA: Diagnosis present

## 2022-03-28 DIAGNOSIS — R2681 Unsteadiness on feet: Secondary | ICD-10-CM | POA: Insufficient documentation

## 2022-03-28 DIAGNOSIS — R278 Other lack of coordination: Secondary | ICD-10-CM | POA: Diagnosis present

## 2022-03-28 DIAGNOSIS — R2689 Other abnormalities of gait and mobility: Secondary | ICD-10-CM | POA: Diagnosis present

## 2022-03-28 DIAGNOSIS — M6281 Muscle weakness (generalized): Secondary | ICD-10-CM | POA: Insufficient documentation

## 2022-03-28 NOTE — Therapy (Signed)
OUTPATIENT PHYSICAL THERAPY TREATMENT NOTE   Patient Name: Maurice Little MRN: [id number] DOB:02/04/1956, 66 y.o., male Today's Date: 03/29/2022  PCP: Maurice Little, Maurice Reveal, MD REFERRING PROVIDER: Cathlyn Parsons, PA-C   PT End of Session - 03/29/22 1208     Visit Number 42    Number of Visits 4    Date for PT Re-Evaluation 83/41/96   Recert   Authorization Type UHC ([place] with no auth required)    PT Start Time 1102    PT Stop Time 1146    PT Time Calculation (min) 44 min    Equipment Utilized During Treatment Gait belt   single bar bells, aquatic step   Activity Tolerance Patient tolerated treatment well    Behavior During Therapy WFL for tasks assessed/performed                   Past Medical History:  Diagnosis Date   Benign prostatic hyperplasia with elevated prostate specific antigen (PSA)    Hypertension    Iliac artery stenosis, left (HCC)    with claudication resolved with stent   PAD (peripheral artery disease) ([place])    Stroke [facility])    Past Surgical History:  Procedure Laterality Date   BUBBLE STUDY  08/31/2021   Procedure: BUBBLE STUDY;  Surgeon: Maurice Latch, MD;  Location: [place];  Service: Cardiovascular;;   ILIAC ARTERY STENT Left    IR ANGIO VERTEBRAL SEL SUBCLAVIAN INNOMINATE UNI L MOD SED  08/30/2021   IR ANGIO VERTEBRAL SEL SUBCLAVIAN INNOMINATE UNI R MOD SED  08/30/2021   IR CT HEAD LTD  08/30/2021   IR INTRA CRAN STENT  08/30/2021   IR RADIOLOGIST EVAL & MGMT  11/28/2021   IR RADIOLOGIST EVAL & MGMT  01/12/2022   IR US GUIDE VASC ACCESS RIGHT  08/30/2021   RADIOLOGY WITH ANESTHESIA N/A 08/30/2021   Procedure: IR WITH ANESTHESIA;  Surgeon: Maurice Bras, MD;  Location: [place];  Service: Radiology;  Laterality: N/A;   TEE WITHOUT CARDIOVERSION N/A 08/31/2021   Procedure: TRANSESOPHAGEAL ECHOCARDIOGRAM (TEE);  Surgeon: Maurice Latch, MD;  Location: [facility] ENDOSCOPY;  Service: Cardiovascular;  Laterality: N/A;   Patient Active Problem  List   Diagnosis Date Noted   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   Cough 01/31/2022   Familial hypercholesteremia 11/06/2021   PFO (patent foramen ovale) 11/06/2021   Urinary retention 09/27/2021   Diplopia 09/27/2021   Carotid stenosis, right 09/27/2021   Anemia 09/27/2021   Slow transit constipation    Benign essential HTN    Cerebellar stroke ([place]) 09/04/2021   Cerebral embolism with cerebral infarction 08/30/2021   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 ([place]) 08/30/2021   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 No  OBJECTIVE    TODAY'S TREATMENT:  03-28-22   Gait Training  Gait pattern: step through pattern, decreased arm swing- Right,  decreased arm swing- Left, decreased step length- Right, decreased stride length, decreased hip/knee flexion- Right, ataxic, lateral hip instability, wide BOS, narrow BOS, and abducted- Right Distance walked: 11' from lobby to mat table in gym:  10' from mat to // bars:  pt gait trained 10' x 4 reps inside // bars with SBA to CGA without UE support as able Assistive device utilized: None Level of assistance: Min A  HHA min assist from lobby to clinic gym;  No RW used today due to pt not bringing it to PT today Comments:    NeuroRe-ed:   Pt performed sitting on SitFit on high/low mat table; performed seated marching each  leg 5 reps; performed contralateral knee extension with UE flexion 5 reps each side with attempted 5 sec hold with min to mod assist for recovery of LOB Performed unilateral UE flexion 5 reps each; bil. UE flexion to 90 degrees 5 reps with cues to tighten core for stability  Pt performed sit to stand without UE support with SBA 2 reps - performs transfer slowly  Pt performed standing balance exercise to improve SLS and coordination on each leg - performed tap ups to 4" step inside // bars 5 reps each leg with minimal UE support; then performed diagonal touches to the # "4" on the step with each foot 5 reps with 2-3 finger UE support on // bar - pt unable to perform this exercise without UE support for balance  Pt performed stepping over and back of black balance beam 5 reps each leg; then sideways stepping over beam 5 reps each leg with 1 UE support on // bar   PATIENT EDUCATION: Education details:  No changes to HEP made on 03-28-22:   Prioritized standing exercises at home to work on standing balance;  standing exs at counter, STRONGLY ENCOURAGED TALL KNEELING AT [employer],  and cont. With walking program Person educated: Patient and Wife Maurice Little)  Education method: Explanation  Education comprehension: verbalized understanding and needs further education    HEP  Access Code: [email or web address] URL: [email or web address] Date: 02/05/2022 Prepared by: Maurice Little  Exercises - Bird Dog  - 1 x daily - 7 x weekly - 3 sets - 10 reps - Tall to Half Kneeling w/RUE support on cane  - 1 x daily - 7 x weekly - 3 sets - 10 reps - Standing march with overhead reach  - 1 x daily - 7 x weekly - 3 sets - 10 reps   GOALS    PT Short Term Goals - 01/11/22 1017       PT SHORT TERM GOAL #1   Title Improve Berg score from 38/56 to >/= 41/56 to decrease fall risk.    Baseline score 38/56 on 01-10-22; 42/56 on 02/08/22   Time 4    Period Weeks    Status MET    Target Date  02/08/22      PT SHORT TERM GOAL #2   Title Improve TUG score from 21.84 secs with RW to </= 19 secs with RW with SBA only (no physical assist needed) to increase safety with ambulation.    Baseline 21.84 secs with RW with CGA to min assist - ataxia in RLE; 21.65s w/SBA and RW   Time 4    Period Weeks    Status NOT MET     Target Date 02/08/22      PT SHORT TERM GOAL #3   Title Pt will amb. 230' without device  with CGA on flat, even surface for increased community accessibility.    Baseline --    Time 4    Period Weeks    Status NOT MET - 230' w/min A 2/2 scissoring of RLE    Target Date 02/08/22      PT SHORT TERM GOAL #4   Title Pt will negotiate 4 steps with use of bil. hand rails with SBA    Baseline --    Time 4    Period Weeks    Status MET - step-through pattern     Target Date 02/08/22      PT SHORT TERM GOAL #5   Title Independent in updated HEP for balance exercises and pt will report performing HEP on regular basis.    Baseline --    Time 4    Period Weeks    Status NOT MET - Pt not performing     Target Date 02/08/22      PT SHORT TERM GOAL #6   Title --    Baseline --    Time --    Period --    Status --    Target Date --             PT Long Term Goals - 03/08/22 1158       PT LONG TERM GOAL #1   Title Pt will be modified independent with household amb. without use of assistive device.    Baseline Wife reports pt walks around house and in driveway occasionally without AD and use of ait belt w/S*    Time 8    Period Weeks    Status PROGRESSING   Target Date 03/08/22      PT LONG TERM GOAL #2   Title Pt will amb. without device with CGA 500' for increased community accessibility.    Time 8    Period Weeks    Status PROGRESSING - 33' w/min A and no AD    Target Date 03/08/22      PT LONG TERM GOAL #3   Title Improve TUG score to </= 20 secs with appropriate assistive device to demo improved functional mobility and to reduce fall risk.     Baseline 1".07 secs with RW - 10-16-21;  TUG with RW- 22.97 secs,   21.84 secs without RW - needs CGA for safety; 18.3s w/RW   Time 8    Period Weeks    Status MET    Target Date 03/08/22      PT LONG TERM GOAL #4   Title Increase Berg score to >/= 45/56 to reduce fall risk.    Baseline 27/56;     01-10-22 = 38/56; 47/56 on 5/5   Time 8    Period Weeks    Status MET   Target Date 03/08/22      PT LONG TERM GOAL #5   Title Increase gait velocity from .43 ft/sec to >/= 2.0 ft/sec with RW for increased gait efficiency.    Baseline 17.59 secs = 1.86 ft/sec with RW; 2.03 ft/s w/RW    Time 8    Period Weeks    Status MET     Target Date 03/08/22      Additional Long Term Goals   Additional Long Term Goals Yes      PT LONG TERM GOAL #6   Title Independent in updated HEP as appropriate.    Baseline met 01-10-22    Time 12    Period Weeks  Status Achieved    Target Date 01/11/22      PT LONG TERM GOAL #7   Title Improve TUG score to </= 17 secs without RW with SBA for increased safety with gait.    Baseline 21.84 secs without RW with min assist    Time 8    Period Weeks    Status Dc'd DUE TO REPEAT GOAL    Target Date 03/08/22           NEW LONG TERM GOALS FOR UPDATED POC:  Target date: 05/03/2022  Pt will amb. 500' w/LRAD and S* for increased community accessibility.  Baseline: 55' no AD w/min guard  Goal status: INITIAL  2.  Pt will be modified independent with household amb. without use of assistive device.  Baseline: S*-CGA from wife, Maurice Little  Goal status: INITIAL  3.  Pt will improve gait velocity to at least 2.4 ft/s with LRAD mod I for improved gait efficiency and independence  Baseline: 2.03 ft/s w/RW on 03/01/22  Goal status: INITIAL  4.  Pt will improve normal TUG to less than or equal to 17 seconds without RW with SBA for improved functional mobility and decreased fall risk.  Baseline:  21.84 secs without RW with min assist  Goal status: INITIAL  5.  Pt  will be independent with final HEP and walking program for improved strength, balance, transfers and gait.  Baseline:  Goal status: INITIAL  6.  Pt will demonstrate single leg stance of >/= 5s per side without UE support for improved single leg stability, balance and ankle strategy  Baseline: <1s per side  Goal status: INITIAL  ASSESSMENT   Plan - 01/11/22 1045     Clinical Impression Statement PT session focused on core stabilization and trunk stability exercises with pt seated on SitFit on side of mat table.  Pt had more difficulty lifting LLE and RUE due to decreased ability to stabilize with RLE in closed chain due to decreased coordination and ataxia.  Remainder of session focused on SLS activities with each leg with pt requiring UE support on // bars to maintain balance and to assist with trunk control.   Cont with POC.    Personal Factors and Comorbidities Comorbidity 2;Profession    Comorbidities basilar artery stenosis, dyslipidemia,  HTN, neurogenic bladder, diplopia, anemia, Lt iliac artery stenosis, PAD    Examination-Activity Limitations Bathing;Carry;Continence;Toileting;Dressing;Lift;Stand;Stairs;Squat;Reach Overhead;Locomotion Level;Transfers    Examination-Participation Restrictions Cleaning;Community Activity;Driving;Interpersonal Relationship;Laundry;Shop;Occupation;Yard Work;Meal Prep    Stability/Clinical Decision Making Evolving/Moderate complexity    Rehab Potential Good    PT Frequency 2x / week    PT Duration 6 weeks (recert)    PT Treatment/Interventions ADLs/Self Care Home Management;Aquatic Therapy;DME Instruction;Gait training;Stair training;Therapeutic activities;Therapeutic exercise;Balance training;Neuromuscular re-education;Patient/family education;Vestibular    PT Next Visit Plan LAND =  Cont with gait without RW, trunk/core stabilization exercises and standing balance exs., coordination exercises esp for RLE. Ladder drills, obstacle courses, rebounder,  quadruped > tall kneel, SciFit for stamina   POOL = swimming laps.  gait without UE support especially backwards, balance with narrow BOS, SLS, step ups, orientation to midline without vision.    PT Home Exercise Plan [email or web address]    Consulted and Agree with Plan of Care Patient;Family member/caregiver    Family Member Consulted wife, Judyann Munson CXKGYJ, [place] 03/29/22    12:11 PM

## 2022-03-29 ENCOUNTER — Encounter: Payer: Self-pay | Admitting: Physical Therapy

## 2022-04-02 ENCOUNTER — Ambulatory Visit: Payer: 59 | Admitting: Physical Therapy

## 2022-04-02 DIAGNOSIS — M6281 Muscle weakness (generalized): Secondary | ICD-10-CM

## 2022-04-02 DIAGNOSIS — Z9181 History of falling: Secondary | ICD-10-CM

## 2022-04-02 DIAGNOSIS — R2681 Unsteadiness on feet: Secondary | ICD-10-CM

## 2022-04-02 DIAGNOSIS — R2689 Other abnormalities of gait and mobility: Secondary | ICD-10-CM | POA: Diagnosis not present

## 2022-04-03 ENCOUNTER — Encounter: Payer: Self-pay | Admitting: Physical Therapy

## 2022-04-03 NOTE — Therapy (Signed)
OUTPATIENT PHYSICAL THERAPY TREATMENT NOTE   Patient Name: Maurice Little MRN: [id number] DOB:1956-06-16, 66 y.o., male Today's Date: 04/03/2022  PCP: Tommi Rumps [place], Buren Kos, MD REFERRING PROVIDER: Charlton Amor, PA-C   PT End of Session - 04/02/22 1643     Visit Number 43    Number of Visits 51    Date for PT Re-Evaluation 04/12/22   Recert   Authorization Type [employer] ([place] with no auth required)    PT Start Time 1015    PT Stop Time 1100    PT Time Calculation (min) 45 min    Equipment Utilized During Treatment Gait belt   single bar bells, aquatic step   Activity Tolerance Patient tolerated treatment well    Behavior During Therapy WFL for tasks assessed/performed                   Past Medical History:  Diagnosis Date   Benign prostatic hyperplasia with elevated prostate specific antigen (PSA)    Hypertension    Iliac artery stenosis, left (HCC)    with claudication resolved with stent   PAD (peripheral artery disease) (HCC)    Stroke [facility])    Past Surgical History:  Procedure Laterality Date   BUBBLE STUDY  08/31/2021   Procedure: BUBBLE STUDY;  Surgeon: Chilton Si, MD;  Location: [facility] ENDOSCOPY;  Service: Cardiovascular;;   ILIAC ARTERY STENT Left    IR ANGIO VERTEBRAL SEL SUBCLAVIAN INNOMINATE UNI L MOD SED  08/30/2021   IR ANGIO VERTEBRAL SEL SUBCLAVIAN INNOMINATE UNI R MOD SED  08/30/2021   IR CT HEAD LTD  08/30/2021   IR INTRA CRAN STENT  08/30/2021   IR RADIOLOGIST EVAL & MGMT  11/28/2021   IR RADIOLOGIST EVAL & MGMT  01/12/2022   IR US GUIDE VASC ACCESS RIGHT  08/30/2021   RADIOLOGY WITH ANESTHESIA N/A 08/30/2021   Procedure: IR WITH ANESTHESIA;  Surgeon: Julieanne Cotton, MD;  Location: [facility] OR;  Service: Radiology;  Laterality: N/A;   TEE WITHOUT CARDIOVERSION N/A 08/31/2021   Procedure: TRANSESOPHAGEAL ECHOCARDIOGRAM (TEE);  Surgeon: Chilton Si, MD;  Location: [facility] ENDOSCOPY;  Service: Cardiovascular;  Laterality: N/A;   Patient Active Problem  List   Diagnosis Date Noted   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   Cough 01/31/2022   Familial hypercholesteremia 11/06/2021   PFO (patent foramen ovale) 11/06/2021   Urinary retention 09/27/2021   Diplopia 09/27/2021   Carotid stenosis, right 09/27/2021   Anemia 09/27/2021   Slow transit constipation    Benign essential HTN    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   Cerebral embolism with cerebral infarction 08/30/2021   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 Did get into his pool over the weekend without any issues.   PAIN:  Are you having pain? No      TODAY'S TREATMENT: 04/02/2022 Aquatic therapy at [facility] - pool temp 94 degrees   Patient seen for aquatic therapy today.  Treatment took place in water 3.5-4.5 feet deep depending upon activity.  Pt entered/exited pool via stairs with step to gait pattern with bil rails and min guard assist for  safety. Min assist with bil HHA for gait on deck wheelchair<>stairs.    Supervision to min guard assist for gait from stairs to deeper end of pool, ~3.3 to 3.5 foot depth water Forward gait for 18 feet across pool with emphasis on large steps and arm swing for 8 laps Backward gait for 18 feet across pool with emphasis on tall posture, step length and arm swing for 8 laps Side stepping 18 feet across pool with emphasis on posture, not rotating and step length for 4 laps toward each side, up to min guard assist at times with side stepping for balance assistance.    Supervision to min guard assist for  gait from deeper end of pool to bench in water.  Min assist to position into tall kneeling with knees being on aquatic step/UE support on bench. Use of 2# aquatic ankle weights to keep LE's on step with all activites performed, along with cues for technique and form.  - mini squats x 15 reps with no UE support - moving knee forward/backward x 10 reps each side - alternating UE raises x 10 reps each side - with single bar bells- moving arms in horizontal abduction/adduction at water level x 10 reps, alternating punching at water level x 10 reps and then holding them out in front with elbow extension so to lower once at a time down to bench<>back to water level x 5 reps each side.       In ~4.5 to 4.8 foot depth water with single bar bells. Cues needed for form and technique.  Dyanmic balance Forward walking mini lunges while moving arms out<>in at water level for 18 feet  across pool x 6 laps, min guard to min assist for balance.  Forward walking high knee marching tapping contralateral hands for 18 feet across pool x 6 laps with min guard assistance.     Swimming breast stroke for 4 laps deep<>shallow end of pool with distant supervision mostly, with one episode of min assist due to pt heading for wall when attempting to take head under water for safety. pt reports he always veers off course when he tries to put his head under water with swimming.      Pt requires buoyancy of water for support for reduced fall risk with gait training and balance exercises with minimal UE support; exercises able to be performed safely in water without the risk of fall compared to those same exercises performed on land;  viscosity of water needed for resistance for strengthening.  Current of water provides perturbations for challenging static & dynamic standing balance.         PATIENT EDUCATION: Education details:continued with current HEP Person educated: Patient and Wife [name]([place])  Education method:  Explanation  Education comprehension: verbalized understanding and needs further education    HOME EXERCISE PROGRAM Access Code: [email or web address] URL: [email or web address] Date: 02/05/2022 Prepared by: Alethia BertholdJannah Plaster  Exercises - Bird Dog  - 1 x daily - 7 x weekly - 3 sets - 10 reps - Tall to Half Kneeling w/RUE support on cane  - 1 x daily - 7 x weekly - 3 sets - 10 reps - Standing march with overhead reach  - 1 x daily - 7 x weekly - 3 sets - 10 reps   GOALS  NEW LONG TERM GOALS FOR UPDATED POC:  Target date: 05/08/2022  Pt will amb. 500' w/LRAD and S* for increased community accessibility.  Baseline: 460' no AD w/min guard  Goal status:  INITIAL  2.  Pt will be modified independent with household amb. without use of assistive device.  Baseline: S*-CGA from wife, [name]  Goal status: INITIAL  3.  Pt will improve gait velocity to at least 2.4 ft/s with LRAD mod I for improved gait efficiency and independence  Baseline: 2.03 ft/s w/RW on 03/01/22  Goal status: INITIAL  4.  Pt will improve normal TUG to less than or equal to 17 seconds without RW with SBA for improved functional mobility and decreased fall risk.  Baseline:  21.84 secs without RW with min assist  Goal status: INITIAL  5.  Pt will be independent with final HEP and walking program for improved strength, balance, transfers and gait.  Baseline:  Goal status: INITIAL  6.  Pt will demonstrate single leg stance of >/= 5s per side without UE support for improved single leg stability, balance and ankle strategy  Baseline: <1s per side  Goal status: INITIAL  ASSESSMENT   Plan - 01/11/22 1045     Clinical Impression Statement Today's skilled session continued to focus on gait, strengthening and balance in the aquatic setting. Was able to advance tall kneeling ex's today without issues. The pt is making steady progress and should benefit from continued PT to progress toward unmet goals.    Personal Factors and  Comorbidities Comorbidity 2;Profession    Comorbidities basilar artery stenosis, dyslipidemia,  HTN, neurogenic bladder, diplopia, anemia, Lt iliac artery stenosis, PAD    Examination-Activity Limitations Bathing;Carry;Continence;Toileting;Dressing;Lift;Stand;Stairs;Squat;Reach Overhead;Locomotion Level;Transfers    Examination-Participation Restrictions Cleaning;Community Activity;Driving;Interpersonal Relationship;Laundry;Shop;Occupation;Yard Work;Meal Prep    Stability/Clinical Decision Making Evolving/Moderate complexity    Rehab Potential Good    PT Frequency 2x / week    PT Duration 6 weeks (recert)    PT Treatment/Interventions ADLs/Self Care Home Management;Aquatic Therapy;DME Instruction;Gait training;Stair training;Therapeutic activities;Therapeutic exercise;Balance training;Neuromuscular re-education;Patient/family education;Vestibular    PT Next Visit Plan LAND =  Cont with gait without RW, trunk/core stabilization exercises and standing balance exs., coordination exercises esp for RLE. Ladder drills, obstacle courses, rebounder, quadruped > tall kneel, SciFit for stamina   POOL = swimming laps.  gait without UE support especially backwards, balance with narrow BOS, SLS, step ups, orientation to midline without vision.    PT Home Exercise Plan [email or web address]    Consulted and Agree with Plan of Care Patient;Family member/caregiver    Family Member Consulted wife, Ulyess Mort, [place], [facility] [address] [place], [place] [address] [phone] 04/03/22, 4:44 PM

## 2022-04-04 ENCOUNTER — Ambulatory Visit: Payer: 59 | Admitting: Physical Therapy

## 2022-04-04 ENCOUNTER — Encounter: Payer: Self-pay | Admitting: Physical Therapy

## 2022-04-04 DIAGNOSIS — R2689 Other abnormalities of gait and mobility: Secondary | ICD-10-CM

## 2022-04-04 DIAGNOSIS — R2681 Unsteadiness on feet: Secondary | ICD-10-CM

## 2022-04-04 NOTE — Therapy (Signed)
OUTPATIENT PHYSICAL THERAPY TREATMENT NOTE   Patient Name: Maurice Little MRN: [id number] DOB:10-03-1956, 66 y.o., male Today's Date: 04/04/2022  PCP: Tennis Must [place], Blondell Reveal, MD REFERRING PROVIDER: Cathlyn Parsons, PA-C   PT End of Session - 04/04/22 1618     Visit Number 91    Number of Visits 64    Date for PT Re-Evaluation 25/05/39   Recert   Authorization Type UHC ([place] with no auth required)    PT Start Time 1448    PT Stop Time 1535    PT Time Calculation (min) 47 min    Equipment Utilized During Treatment Gait belt   single bar bells, aquatic step   Activity Tolerance Patient tolerated treatment well    Behavior During Therapy WFL for tasks assessed/performed                    Past Medical History:  Diagnosis Date   Benign prostatic hyperplasia with elevated prostate specific antigen (PSA)    Hypertension    Iliac artery stenosis, left (HCC)    with claudication resolved with stent   PAD (peripheral artery disease) ([place])    Stroke [facility])    Past Surgical History:  Procedure Laterality Date   BUBBLE STUDY  08/31/2021   Procedure: BUBBLE STUDY;  Surgeon: Skeet Latch, MD;  Location: [place];  Service: Cardiovascular;;   ILIAC ARTERY STENT Left    IR ANGIO VERTEBRAL SEL SUBCLAVIAN INNOMINATE UNI L MOD SED  08/30/2021   IR ANGIO VERTEBRAL SEL SUBCLAVIAN INNOMINATE UNI R MOD SED  08/30/2021   IR CT HEAD LTD  08/30/2021   IR INTRA CRAN STENT  08/30/2021   IR RADIOLOGIST EVAL & MGMT  11/28/2021   IR RADIOLOGIST EVAL & MGMT  01/12/2022   IR US GUIDE VASC ACCESS RIGHT  08/30/2021   RADIOLOGY WITH ANESTHESIA N/A 08/30/2021   Procedure: IR WITH ANESTHESIA;  Surgeon: Luanne Bras, MD;  Location: [place];  Service: Radiology;  Laterality: N/A;   TEE WITHOUT CARDIOVERSION N/A 08/31/2021   Procedure: TRANSESOPHAGEAL ECHOCARDIOGRAM (TEE);  Surgeon: Skeet Latch, MD;  Location: [facility] ENDOSCOPY;  Service: Cardiovascular;  Laterality: N/A;   Patient Active  Problem List   Diagnosis Date Noted   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   Cough 01/31/2022   Familial hypercholesteremia 11/06/2021   PFO (patent foramen ovale) 11/06/2021   Urinary retention 09/27/2021   Diplopia 09/27/2021   Carotid stenosis, right 09/27/2021   Anemia 09/27/2021   Slow transit constipation    Benign essential HTN    Cerebellar stroke ([place]) 09/04/2021   Cerebral embolism with cerebral infarction 08/30/2021   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 No  OBJECTIVE    TODAY'S TREATMENT:  04-04-22   Gait Training  Gait pattern: step through pattern, decreased arm swing- Right, decreased arm swing- Left, decreased step length- Right, decreased stride length, decreased hip/knee flexion- Right, ataxic, lateral hip instability, wide BOS, narrow BOS, and abducted- Right Distance walked: 32' from lobby to  mat table in gym;  amb. 45' from mat table to // bars; 10' x 3 reps inside // bars without UE support - and no UE support with the turn; 45' from // bars back to mat table with min hand held assist Assistive device utilized: None Level of assistance: Min A  HHA min assist from lobby to clinic gym;  No RW used in session today Comments: cues to decrease speed for increased control   NeuroRe-ed:   Pt performed sit to stand without UE support from mat table with SBA 1 rep;  No LOB upon initial standing   Pt performed standing balance exercise to improve SLS and  coordination on each leg - performed tap ups to 4" step inside // bars 5 reps each leg with 2 finger UE support; then performed diagonal touches to the # "4" on the step with each foot 5 reps with 2-3 finger UE support on // bar - pt unable to perform this exercise without UE support for balance  Pt performed stepping over and back of black balance beam 5 reps each leg; then sideways stepping over beam 5 reps each leg with 1 UE support on // bar  Marching in place 10 reps each LE with 2 finger support;  marching in place with UE support on // bars with head turns; Marching in place without head turns 5 reps each LE with CGA  Pt performed SLS/coordination exercise - placed foot on medium sized purple ball - rolled ball forward/back and then side to side 5 reps each with each LE - with UE support on // bars; 2nd set performed with 2 finger UE support each hand on // bars   Self Care:  Discussed plan for PT with pt and wife as next week (7-59-16) is end of certification period.  Recommended pt to be placed on hold for 1 month with HEP compliance as pt has own pool at home; pt agrees and states he is ready for a break from PT; plan is to re-eval in mid-July  Reviewed HEP for land exercises (priority balance exercises) and instructed in Ai Chi postures for aquatic exercises (handout provided); wife and pt verbalized understanding   PATIENT EDUCATION: Education details:  No changes to HEP made on 03-28-22:  added Ai Chi postures to HEP for aquatic exercises on 04-04-22 Prioritized standing exercises at home to work on standing balance;  standing exs at counter, STRONGLY ENCOURAGED TALL KNEELING AT [place],  and cont. With walking program Person educated: Patient and Wife [name])  Education method: Explanation ; Handout provided for Ai Chi postures Education comprehension: verbalized understanding    HEP  Access Code: [email or web address] URL: [email or web address] Date:  02/05/2022 Prepared by: Mickie Bail Plaster  Exercises - Bird Dog  - 1 x daily - 7 x weekly - 3 sets - 10 reps - Tall to Half Kneeling w/RUE support on cane  - 1 x daily - 7 x weekly - 3 sets - 10 reps - Standing march with overhead reach  - 1 x daily - 7 x weekly - 3 sets - 10 reps   GOALS    PT Short Term Goals - 01/11/22 1017       PT SHORT TERM GOAL #1   Title Improve Berg score from 38/56 to >/= 41/56 to decrease fall risk.    Baseline score 38/56 on 01-10-22; 42/56 on 02/08/22   Time 4    Period Weeks    Status MET  Target Date 02/08/22      PT SHORT TERM GOAL #2   Title Improve TUG score from 21.84 secs with RW to </= 19 secs with RW with SBA only (no physical assist needed) to increase safety with ambulation.    Baseline 21.84 secs with RW with CGA to min assist - ataxia in RLE; 21.65s w/SBA and RW   Time 4    Period Weeks    Status NOT MET     Target Date 02/08/22      PT SHORT TERM GOAL #3   Title Pt will amb. 230' without device with CGA on flat, even surface for increased community accessibility.    Baseline --    Time 4    Period Weeks    Status NOT MET - 230' w/min A 2/2 scissoring of RLE    Target Date 02/08/22      PT SHORT TERM GOAL #4   Title Pt will negotiate 4 steps with use of bil. hand rails with SBA    Baseline --    Time 4    Period Weeks    Status MET - step-through pattern     Target Date 02/08/22      PT SHORT TERM GOAL #5   Title Independent in updated HEP for balance exercises and pt will report performing HEP on regular basis.    Baseline --    Time 4    Period Weeks    Status NOT MET - Pt not performing     Target Date 02/08/22      PT SHORT TERM GOAL #6   Title --    Baseline --    Time --    Period --    Status --    Target Date --             PT Long Term Goals - 03/08/22 1158       PT LONG TERM GOAL #1   Title Pt will be modified independent with household amb. without use of assistive device.    Baseline Wife  reports pt walks around house and in driveway occasionally without AD and use of ait belt w/S*    Time 8    Period Weeks    Status PROGRESSING   Target Date 03/08/22      PT LONG TERM GOAL #2   Title Pt will amb. without device with CGA 500' for increased community accessibility.    Time 8    Period Weeks    Status PROGRESSING - 75' w/min A and no AD    Target Date 03/08/22      PT LONG TERM GOAL #3   Title Improve TUG score to </= 20 secs with appropriate assistive device to demo improved functional mobility and to reduce fall risk.    Baseline 1".07 secs with RW - 10-16-21;  TUG with RW- 22.97 secs,   21.84 secs without RW - needs CGA for safety; 18.3s w/RW   Time 8    Period Weeks    Status MET    Target Date 03/08/22      PT LONG TERM GOAL #4   Title Increase Berg score to >/= 45/56 to reduce fall risk.    Baseline 27/56;     01-10-22 = 38/56; 47/56 on 5/5   Time 8    Period Weeks    Status MET   Target Date 03/08/22      PT LONG TERM GOAL #5  Title Increase gait velocity from .43 ft/sec to >/= 2.0 ft/sec with RW for increased gait efficiency.    Baseline 17.59 secs = 1.86 ft/sec with RW; 2.03 ft/s w/RW    Time 8    Period Weeks    Status MET     Target Date 03/08/22      Additional Long Term Goals   Additional Long Term Goals Yes      PT LONG TERM GOAL #6   Title Independent in updated HEP as appropriate.    Baseline met 01-10-22    Time 12    Period Weeks    Status Achieved    Target Date 01/11/22      PT LONG TERM GOAL #7   Title Improve TUG score to </= 17 secs without RW with SBA for increased safety with gait.    Baseline 21.84 secs without RW with min assist    Time 8    Period Weeks    Status Dc'd DUE TO REPEAT GOAL    Target Date 03/08/22           NEW LONG TERM GOALS FOR UPDATED POC:  Target date: 04-12-22  Pt will amb. 500' w/LRAD and S* for increased community accessibility.  Baseline: 77' no AD w/min guard  Goal status: INITIAL  2.   Pt will be modified independent with household amb. without use of assistive device.  Baseline: S*-CGA from wife, [name]  Goal status: INITIAL  3.  Pt will improve gait velocity to at least 2.4 ft/s with LRAD mod I for improved gait efficiency and independence  Baseline: 2.03 ft/s w/RW on 03/01/22  Goal status: INITIAL  4.  Pt will improve normal TUG to less than or equal to 17 seconds without RW with SBA for improved functional mobility and decreased fall risk.  Baseline:  21.84 secs without RW with min assist  Goal status: INITIAL  5.  Pt will be independent with final HEP and walking program for improved strength, balance, transfers and gait.  Baseline:  Goal status: INITIAL  6.  Pt will demonstrate single leg stance of >/= 5s per side without UE support for improved single leg stability, balance and ankle strategy  Baseline: <1s per side  Goal status: INITIAL  ASSESSMENT   Plan - 01/11/22 1045     Clinical Impression Statement PT session focused on standing balance exercises to improve SLS on each leg and also on gait training without UE support inside // bars.  Pt unable to perform marching with head turns without UE support but was able to perform marching without head turns slowly without UE support on // bars.  Discussed plan for continuation vs. Hold after end of certification period next week- pt states he is ready to take a break from PT and pt is plateauing in maximizing functional progress at this time (hold PT for 1 month is recommendation of primary PT).  Cont with POC.    Personal Factors and Comorbidities Comorbidity 2;Profession    Comorbidities basilar artery stenosis, dyslipidemia,  HTN, neurogenic bladder, diplopia, anemia, Lt iliac artery stenosis, PAD    Examination-Activity Limitations Bathing;Carry;Continence;Toileting;Dressing;Lift;Stand;Stairs;Squat;Reach Overhead;Locomotion Level;Transfers    Examination-Participation Restrictions Cleaning;Community  Activity;Driving;Interpersonal Relationship;Laundry;Shop;Occupation;Yard Work;Meal Prep    Stability/Clinical Decision Making Evolving/Moderate complexity    Rehab Potential Good    PT Frequency 2x / week    PT Duration 6 weeks (recert)    PT Treatment/Interventions ADLs/Self Care Home Management;Aquatic Therapy;DME Instruction;Gait training;Stair training;Therapeutic activities;Therapeutic exercise;Balance training;Neuromuscular re-education;Patient/family education;Vestibular  PT Next Visit Plan  Check LTG's and place pt on hold for 1 month - pt will schedule appt to return in mid-July: review HEP as needed, but I think pt and wife have good understanding of land and aquatic exercises (pt has own pool at home)   POOL = swimming laps.  gait without UE support especially backwards, balance with narrow BOS, SLS, step ups, orientation to midline without vision.    PT Home Exercise Plan [email or web address]    Consulted and Agree with Plan of Care Patient;Family member/caregiver    Family Member Consulted wife, Judyann Munson VKPQAE, [place] 04/04/22    7:05 PM

## 2022-04-09 ENCOUNTER — Encounter: Payer: Self-pay | Admitting: Physical Therapy

## 2022-04-09 ENCOUNTER — Ambulatory Visit: Payer: 59 | Admitting: Physical Therapy

## 2022-04-09 DIAGNOSIS — R2689 Other abnormalities of gait and mobility: Secondary | ICD-10-CM | POA: Diagnosis not present

## 2022-04-09 DIAGNOSIS — M6281 Muscle weakness (generalized): Secondary | ICD-10-CM

## 2022-04-09 DIAGNOSIS — R2681 Unsteadiness on feet: Secondary | ICD-10-CM

## 2022-04-09 NOTE — Therapy (Signed)
OUTPATIENT PHYSICAL THERAPY TREATMENT NOTE   Patient Name: Maurice DaubReiner Little MRN: [id number] DOB:09/10/1956, 66 y.o., male Today's Date: 04/09/2022  PCP: Tommi Rumpsde [place], Buren Kosaymond J, MD REFERRING PROVIDER: Charlton AmorAngiulli, Daniel J, PA-C   PT End of Session - 04/09/22 1415     Visit Number 45    Number of Visits 51    Date for PT Re-Evaluation 04/12/22   Recert   Authorization Type [employer] ([place] with no auth required)    PT Start Time 1015    PT Stop Time 1102    PT Time Calculation (min) 47 min    Equipment Utilized During Treatment Other (comment)   single bar bells, aquatic step   Activity Tolerance Patient tolerated treatment well    Behavior During Therapy WFL for tasks assessed/performed                    Past Medical History:  Diagnosis Date   Benign prostatic hyperplasia with elevated prostate specific antigen (PSA)    Hypertension    Iliac artery stenosis, left (HCC)    with claudication resolved with stent   PAD (peripheral artery disease) (HCC)    Stroke [facility](HCC)    Past Surgical History:  Procedure Laterality Date   BUBBLE STUDY  08/31/2021   Procedure: BUBBLE STUDY;  Surgeon: Chilton Siandolph, Tiffany, MD;  [facility];  Service: Cardiovascular;;   ILIAC ARTERY STENT Left    IR ANGIO VERTEBRAL SEL SUBCLAVIAN INNOMINATE UNI L MOD SED  08/30/2021   IR ANGIO VERTEBRAL SEL SUBCLAVIAN INNOMINATE UNI R MOD SED  08/30/2021   IR CT HEAD LTD  08/30/2021   IR INTRA CRAN STENT  08/30/2021   IR RADIOLOGIST EVAL & MGMT  11/28/2021   IR RADIOLOGIST EVAL & MGMT  01/12/2022   IR US GUIDE VASC ACCESS RIGHT  08/30/2021   RADIOLOGY WITH ANESTHESIA N/A 08/30/2021   Procedure: IR WITH ANESTHESIA;  Surgeon: Julieanne Cottoneveshwar, Sanjeev, MD;  Location: [facility] OR;  Service: Radiology;  Laterality: N/A;   TEE WITHOUT CARDIOVERSION N/A 08/31/2021   Procedure: TRANSESOPHAGEAL ECHOCARDIOGRAM (TEE);  Surgeon: Chilton Siandolph, Tiffany, MD;  [facility];  Service: Cardiovascular;  Laterality: N/A;   Patient Active  Problem List   Diagnosis Date Noted   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   Cough 01/31/2022   Familial hypercholesteremia 11/06/2021   PFO (patent foramen ovale) 11/06/2021   Urinary retention 09/27/2021   Diplopia 09/27/2021   Carotid stenosis, right 09/27/2021   Anemia 09/27/2021   Slow transit constipation    Benign essential HTN    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   Cerebral embolism with cerebral infarction 08/30/2021   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 No falls to report. Was not able to get into pool over weekend.   PAIN:  Are you having pain? No   TODAY'S TREATMENT: Aquatic therapy at [facility] - pool temp 94 degrees   Patient seen for aquatic therapy today.  Treatment took place in water 3.5-4.5 feet deep depending upon activity.  Pt entered/exited pool via stairs with step to gait pattern with bil rails and min guard assist for safety. Min assist with bil  HHA for gait on deck wheelchair<>stairs.    Supervision to min guard assist for gait from stairs to deeper end of pool, ~3.3 to 3.5 foot depth water Forward gait for 18 feet across pool with emphasis on large steps and arm swing for 10 laps Backward gait for 18 feet across pool with emphasis on tall posture, step length and arm swing for 10 laps Side stepping 18 feet across pool with emphasis on posture, not rotating and step length for 4 laps toward each side, up to min guard assist at times with side stepping for balance assistance.    In ~4.5 to 4.8 foot depth water with single bar bells.  Cues needed for form and technique.  Dyanmic balance Forward walking mini lunges while moving arms out<>in at water level for 18 feet  across pool x 6 laps, min guard to min assist for balance.  Forward walking high knee marching tapping contralateral hands for 18 feet across pool x 6 laps with min guard assistance.    Supervision to min guard assist for gait from deeper end of pool to bench in water.  Min assist to position into tall kneeling with knees being on aquatic step/UE support on bench. Use of 2# aquatic ankle weights to keep LE's on step with all activites performed, along with cues for technique and form.  - mini squats x 15 reps with no UE support - with single bar bells- moving arms in horizontal abduction/adduction at water level x 10 reps, alternating punching at water level x 10 reps and then holding them out in front with elbow extension so to lower once at a time down to bench<>back to water level x 5 reps each side.        Swimming breast stroke for 2 laps deep<>shallow end of pool with distant supervision mostly, with one episode of min assist due to pt heading for wall when attempting to take head under water for safety. pt reports he always veers off course when he tries to put his head under water with swimming.      Pt requires buoyancy of water for support for reduced fall risk with gait training and balance exercises with minimal UE support; exercises able to be performed safely in water without the risk of fall compared to those same exercises performed on land;  viscosity of water needed for resistance for strengthening.  Current of water provides perturbations for challenging static & dynamic standing balance.       PATIENT EDUCATION: Education details: aquatic program for home Person educated: Patient Education method: Explanation, Demonstration, and Verbal cues Education comprehension: verbalized understanding and returned demonstration    HOME EXERCISE  PROGRAM Access Code: [email or web address] URL: [email or web address] Date: 02/05/2022 Prepared by: Alethia Berthold Plaster  Exercises - Bird Dog  - 1 x daily - 7 x weekly - 3 sets - 10 reps - Tall to Half Kneeling w/RUE support on cane  - 1 x daily - 7 x weekly - 3 sets - 10 reps - Standing march with overhead reach  - 1 x daily - 7 x weekly - 3 sets - 10 reps   GOALS   NEW LONG TERM GOALS FOR UPDATED POC:  Target date: 04-12-22  Pt will amb. 500' w/LRAD and S* for increased community accessibility.  Baseline: 460' no AD w/min guard  Goal status: INITIAL  2.  Pt will be modified independent with household amb. without use of assistive device.  Baseline: S*-CGA from wife, [name]  Goal  status: INITIAL  3.  Pt will improve gait velocity to at least 2.4 ft/s with LRAD mod I for improved gait efficiency and independence  Baseline: 2.03 ft/s w/RW on 03/01/22  Goal status: INITIAL  4.  Pt will improve normal TUG to less than or equal to 17 seconds without RW with SBA for improved functional mobility and decreased fall risk.  Baseline:  21.84 secs without RW with min assist  Goal status: INITIAL  5.  Pt will be independent with final HEP and walking program for improved strength, balance, transfers and gait.  Baseline:  Goal status: INITIAL  6.  Pt will demonstrate single leg stance of >/= 5s per side without UE support for improved single leg stability, balance and ankle strategy  Baseline: <1s per side  Goal status: INITIAL  ASSESSMENT   Plan - 01/11/22 1045     Clinical Impression Statement PT session focused on gait, strengthening and balance in the aquatic setting. No issues noted or reported. Discussed how to perform all these activities at home, that tall kneeling ex's can be done in standing. Pt verbalized understanding.    Personal Factors and Comorbidities Comorbidity 2;Profession    Comorbidities basilar artery stenosis, dyslipidemia,  HTN, neurogenic bladder, diplopia, anemia,  Lt iliac artery stenosis, PAD    Examination-Activity Limitations Bathing;Carry;Continence;Toileting;Dressing;Lift;Stand;Stairs;Squat;Reach Overhead;Locomotion Level;Transfers    Examination-Participation Restrictions Cleaning;Community Activity;Driving;Interpersonal Relationship;Laundry;Shop;Occupation;Yard Work;Meal Prep    Stability/Clinical Decision Making Evolving/Moderate complexity    Rehab Potential Good    PT Frequency 2x / week    PT Duration 6 weeks (recert)    PT Treatment/Interventions ADLs/Self Care Home Management;Aquatic Therapy;DME Instruction;Gait training;Stair training;Therapeutic activities;Therapeutic exercise;Balance training;Neuromuscular re-education;Patient/family education;Vestibular    PT Next Visit Plan  Check LTG's and place pt on hold for 1 month - pt will schedule appt to return in mid-July: review HEP as needed, but I think pt and wife have good understanding of land and aquatic exercises (pt has own pool at home)   PT Home Exercise Plan [email or web address]    Consulted and Agree with Plan of Care Patient;Family member/caregiver    Family Member Consulted wife, Ulyess Mort, [place], [facility] [address] [place], [place] [address] [phone] 04/09/22, 2:30 PM

## 2022-04-11 ENCOUNTER — Ambulatory Visit: Payer: 59 | Admitting: Physical Therapy

## 2022-04-11 DIAGNOSIS — R2681 Unsteadiness on feet: Secondary | ICD-10-CM

## 2022-04-11 DIAGNOSIS — R2689 Other abnormalities of gait and mobility: Secondary | ICD-10-CM

## 2022-04-11 DIAGNOSIS — M6281 Muscle weakness (generalized): Secondary | ICD-10-CM

## 2022-04-11 NOTE — Therapy (Signed)
OUTPATIENT PHYSICAL THERAPY TREATMENT NOTE   Patient Name: Maurice Little MRN: [id number] DOB:02-03-1956, 66 y.o., male Today's Date: 04/11/2022  PCP: Tommi Rumps [place], Buren Kos, MD REFERRING PROVIDER: Charlton Amor, PA-C   PT End of Session - 04/11/22 1444     Visit Number 46    Number of Visits 51    Date for PT Re-Evaluation 04/12/22   Recert   Authorization Type [employer] ([place] with no auth required)    PT Start Time 1442    PT Stop Time 1528    PT Time Calculation (min) 46 min    Equipment Utilized During Treatment Gait belt    Activity Tolerance Patient tolerated treatment well    Behavior During Therapy WFL for tasks assessed/performed                     Past Medical History:  Diagnosis Date   Benign prostatic hyperplasia with elevated prostate specific antigen (PSA)    Hypertension    Iliac artery stenosis, left (HCC)    with claudication resolved with stent   PAD (peripheral artery disease) (HCC)    Stroke [facility])    Past Surgical History:  Procedure Laterality Date   BUBBLE STUDY  08/31/2021   Procedure: BUBBLE STUDY;  Surgeon: Chilton Si, MD;  Location: [facility] ENDOSCOPY;  Service: Cardiovascular;;   ILIAC ARTERY STENT Left    IR ANGIO VERTEBRAL SEL SUBCLAVIAN INNOMINATE UNI L MOD SED  08/30/2021   IR ANGIO VERTEBRAL SEL SUBCLAVIAN INNOMINATE UNI R MOD SED  08/30/2021   IR CT HEAD LTD  08/30/2021   IR INTRA CRAN STENT  08/30/2021   IR RADIOLOGIST EVAL & MGMT  11/28/2021   IR RADIOLOGIST EVAL & MGMT  01/12/2022   IR US GUIDE VASC ACCESS RIGHT  08/30/2021   RADIOLOGY WITH ANESTHESIA N/A 08/30/2021   Procedure: IR WITH ANESTHESIA;  Surgeon: Julieanne Cotton, MD;  Location: [facility] OR;  Service: Radiology;  Laterality: N/A;   TEE WITHOUT CARDIOVERSION N/A 08/31/2021   Procedure: TRANSESOPHAGEAL ECHOCARDIOGRAM (TEE);  Surgeon: Chilton Si, MD;  Location: [facility] ENDOSCOPY;  Service: Cardiovascular;  Laterality: N/A;   Patient Active Problem List   Diagnosis Date  Noted   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   Cough 01/31/2022   Familial hypercholesteremia 11/06/2021   PFO (patent foramen ovale) 11/06/2021   Urinary retention 09/27/2021   Diplopia 09/27/2021   Carotid stenosis, right 09/27/2021   Anemia 09/27/2021   Slow transit constipation    Benign essential HTN    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   Cerebral embolism with cerebral infarction 08/30/2021   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 Modified some exercises in the pool to focus on balance w/Kathy. No new falls. Pt verbalized understanding of taking one month off from PT to work on exercises and pool therapy at home.   PAIN:  Are you having pain? No   TODAY'S TREATMENT: Ther Act  LTG Assessment   [facility] PT Assessment - 04/11/22 1509       Ambulation/Gait   Gait velocity 32.8' over 14.03s = 2.33 ft/s w/RW and  min A      Timed Up and Go Test   Normal TUG (seconds) 16.34   CGA for safety, no AD           Gait pattern: step to pattern, decreased step length- Left, decreased stance time- Right, decreased stride length, decreased hip/knee flexion- Right, decreased hip/knee flexion- Left, shuffling, trunk flexed, and poor foot clearance- Left Distance walked: 460' Assistive device utilized: Environmental consultant - 2 wheeled Level of assistance: CGA and Min A Comments: Attempted to ambulate >500' w/RW mod I, but on lap 3, pt very fatigued and began to demonstrate poor foot clearance of LLE and LOB to L side, requiring min guard. Noted medial  heel whip of R foot and over reliance on LUE, causing tipping of RW and R foot to trip on walker. Lengthy discussion regarding importance of stamina training (walking 1-2 laps every hour) for improved balance and walking, as pt fatigues very quickly and becomes unstable. Pt verbalized understanding.    PATIENT EDUCATION: Education details: Goal assessment, continuing HEP, re-eval in one month, emphasis on stamina training  Person educated: Patient Education method: Explanation, Demonstration, and Verbal cues Education comprehension: verbalized understanding and returned demonstration    HOME EXERCISE PROGRAM Access Code: [email or web address] URL: [email or web address] Date: 02/05/2022 Prepared by: Alethia Berthold Jolena Kittle  Exercises - Bird Dog  - 1 x daily - 7 x weekly - 3 sets - 10 reps - Tall to Half Kneeling w/RUE support on cane  - 1 x daily - 7 x weekly - 3 sets - 10 reps - Standing march with overhead reach  - 1 x daily - 7 x weekly - 3 sets - 10 reps   GOALS   NEW LONG TERM GOALS FOR UPDATED POC:  Target date: 04-12-22  Pt will amb. 500' w/LRAD and S* for increased community accessibility.  Baseline: 460' no AD w/min guard; 460' w/RW and min guard  Goal status: IN PROGRESS  2.  Pt will be modified independent with household amb. without use of assistive device.  Baseline: S*-CGA from wife, [name]; continued need for S*  Goal status: IN PROGRESS  3.  Pt will improve gait velocity to at least 2.4 ft/s with LRAD mod I for improved gait efficiency and independence  Baseline: 2.03 ft/s w/RW on 03/01/22; 2.33 ft/s w/RW on 6/15  Goal status: IN PROGRESS  4.  Pt will improve normal TUG to less than or equal to 17 seconds without RW with SBA for improved functional mobility and decreased fall risk.  Baseline:  21.84 secs without RW with min assist; 16.34s without AD and CGA Goal status: IN PROGRESS  5.  Pt will be independent with final HEP and walking program for improved strength,  balance, transfers and gait.  Baseline:  Goal status: IN PROGRESS  6.  Pt will demonstrate single leg stance of >/= 5s per side without UE support for improved single leg stability, balance and ankle strategy  Baseline: 3s on LLE, 2s on RLE Goal status: IN PROGRESS  ASSESSMENT   Plan - 01/11/22 1045     Clinical Impression Statement Emphasis of skilled PT session on LTG assessment. Pt is progressing 6 of 6 LTGs and is in agreement to be put on hold for one month from PT to work on exercises at home. Appointment made on July 20th to reassess goals to determine POC. Pt has improved his gait speed w/RW and ambulated ~300' w/RW and CGA prior to needing min guard for 160' due to fatigue  and instability. Pt improved his TUG time but continues to require CGA due to ataxia of R hemibody. Pt unsafe to be mod I w/household ambulation without AD at this time, still progressing to be mod I w/RW. Continue POC.    Personal Factors and Comorbidities Comorbidity 2;Profession    Comorbidities basilar artery stenosis, dyslipidemia,  HTN, neurogenic bladder, diplopia, anemia, Lt iliac artery stenosis, PAD    Examination-Activity Limitations Bathing;Carry;Continence;Toileting;Dressing;Lift;Stand;Stairs;Squat;Reach Overhead;Locomotion Level;Transfers    Examination-Participation Restrictions Cleaning;Community Activity;Driving;Interpersonal Relationship;Laundry;Shop;Occupation;Yard Work;Meal Prep    Stability/Clinical Decision Making Evolving/Moderate complexity    Rehab Potential Good    PT Frequency 2x / week    PT Duration 6 weeks (recert)    PT Treatment/Interventions ADLs/Self Care Home Management;Aquatic Therapy;DME Instruction;Gait training;Stair training;Therapeutic activities;Therapeutic exercise;Balance training;Neuromuscular re-education;Patient/family education;Vestibular    PT Next Visit Plan  Recert and reassess LTGs - how are things going? HEP? POC moving forward   PT Home Exercise Plan [email or web address]     Consulted and Agree with Plan of Care Patient;Family member/caregiver    Family Member Consulted wife, Alcide Goodness Pamalee Marcoe, PT, DPT 04/11/22, 3:49 PM

## 2022-04-19 ENCOUNTER — Other Ambulatory Visit: Payer: Self-pay | Admitting: Adult Health

## 2022-04-22 ENCOUNTER — Other Ambulatory Visit: Payer: Self-pay | Admitting: Adult Health

## 2022-04-22 ENCOUNTER — Other Ambulatory Visit: Payer: Self-pay | Admitting: Student

## 2022-04-22 ENCOUNTER — Telehealth (HOSPITAL_COMMUNITY): Payer: Self-pay

## 2022-04-22 MED ORDER — TICAGRELOR 90 MG PO TABS
90.0000 mg | ORAL_TABLET | Freq: Two times a day (BID) | ORAL | 0 refills | Status: DC
Start: 2022-04-22 — End: 2022-05-27

## 2022-04-22 NOTE — Telephone Encounter (Signed)
 Pt called to get a refill of his Brilinta . I have sent a message to Aimee to refill. AW

## 2022-05-06 ENCOUNTER — Encounter (HOSPITAL_BASED_OUTPATIENT_CLINIC_OR_DEPARTMENT_OTHER): Payer: 59 | Admitting: Family Medicine

## 2022-05-09 ENCOUNTER — Encounter (HOSPITAL_BASED_OUTPATIENT_CLINIC_OR_DEPARTMENT_OTHER): Payer: 59 | Admitting: Family Medicine

## 2022-05-14 ENCOUNTER — Encounter: Payer: 59 | Attending: Registered Nurse | Admitting: Physical Medicine & Rehabilitation

## 2022-05-14 ENCOUNTER — Encounter: Payer: Self-pay | Admitting: Physical Medicine & Rehabilitation

## 2022-05-14 VITALS — BP 130/81 | HR 69 | Ht 72.0 in | Wt 238.0 lb

## 2022-05-14 DIAGNOSIS — G463 Brain stem stroke syndrome: Secondary | ICD-10-CM | POA: Insufficient documentation

## 2022-05-14 DIAGNOSIS — I639 Cerebral infarction, unspecified: Secondary | ICD-10-CM | POA: Insufficient documentation

## 2022-05-14 NOTE — Progress Notes (Signed)
Subjective:    Patient ID: Maurice Little, male    DOB: 1956-09-01, 66 y.o.   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    RIght shoulder pain when laying on Right side at night  Doing aquatic exercise walking forward , backward, skimming pool  Also working on steps in the pool Walks to [employer] with assistance Finished OT, puts dishes away , gets his own coffee Walks with walker in house Uses scooter outside the home  s/p VA and BA stenting on Brilinta per IR recommendations followed by Dr. Corliss Skains, CTA head/neck 12/2021 patent flow through R vertebrobasilar junction and BA portion CTA 12/2021 Right ICA 60% stenosis, left ICA 40% stenosis  CTA 08/2021 right R ICA 65%, L ICA 50% stenosis Plans on repeat CTA in September   Pain Inventory Average Pain 0 Pain Right Now 0 My pain is  No pain  LOCATION OF PAIN  7  BOWEL Number of stools per week: 7   BLADDER Normal    Mobility how many minutes can you walk? 5 ability to climb steps?  yes do you drive?  no transfers alone Do you have any goals in this area?  yes  Function disabled: date disabled 08/29/2021 I need assistance with the following:  bathing, meal prep, household duties, and shopping Do you have any goals in this area?  yes  Neuro/Psych weakness numbness trouble walking spasms dizziness loss of taste or smell  Prior Studies Any changes since last visit?  yes CT/MRI   Physicians involved in your care Any changes since last visit?  no   Family History  Problem Relation Age of Onset   Healthy Mother    Social History   Socioeconomic History   Marital status: Married    Spouse name: Jody   Number of children: Not on file   Years of education: Not on file   Highest education level: Bachelor's degree (e.g., BA, AB, BS)  Occupational History   Not on file  Tobacco Use   Smoking status: Former    Types: Cigarettes   Smokeless tobacco:  Never   Tobacco comments:    Quit 2000  Vaping Use   Vaping Use: Never used  Substance and Sexual Activity   Alcohol use: Not Currently   Drug use: Never   Sexual activity: Not on file  Other Topics Concern   Not on file  Social History Narrative   Lives with wife   Social Determinants of Health   Financial Resource Strain: Not on file  Food Insecurity: Not on file  Transportation Needs: Not on file  Physical Activity: Not on file  Stress: Not on file  Social Connections: Not on file   Past Surgical History:  Procedure Laterality Date   BUBBLE STUDY  08/31/2021   Procedure: BUBBLE STUDY;  Surgeon: Chilton Si, MD;  Location: [facility] ENDOSCOPY;  Service: Cardiovascular;;   ILIAC ARTERY STENT Left    IR ANGIO VERTEBRAL SEL SUBCLAVIAN INNOMINATE UNI L MOD SED  08/30/2021   IR ANGIO VERTEBRAL SEL SUBCLAVIAN INNOMINATE UNI R MOD SED  08/30/2021   IR CT HEAD LTD  08/30/2021   IR INTRA CRAN STENT  08/30/2021   IR RADIOLOGIST EVAL & MGMT  11/28/2021   IR RADIOLOGIST EVAL & MGMT  01/12/2022   IR US GUIDE VASC ACCESS RIGHT  08/30/2021   RADIOLOGY WITH ANESTHESIA N/A 08/30/2021   Procedure: IR WITH ANESTHESIA;  Surgeon: Corliss Skains,  Simonne Maffucci, MD;  Location: [facility] OR;  Service: Radiology;  Laterality: N/A;   TEE WITHOUT CARDIOVERSION N/A 08/31/2021   Procedure: TRANSESOPHAGEAL ECHOCARDIOGRAM (TEE);  Surgeon: Chilton Si, MD;  Location: [facility] ENDOSCOPY;  Service: Cardiovascular;  Laterality: N/A;   Past Medical History:  Diagnosis Date   Benign prostatic hyperplasia with elevated prostate specific antigen (PSA)    Hypertension    Iliac artery stenosis, left (HCC)    with claudication resolved with stent   PAD (peripheral artery disease) (HCC)    Stroke (HCC)    There were no vitals taken for this visit.  Opioid Risk Score:   Fall Risk Score:  `1  Depression screen [facility] 2/9     02/08/2022   10:19 AM 11/12/2021    1:33 PM 10/08/2021    9:06 AM  Depression screen PHQ 2/9   Decreased Interest 0 0 0  Down, Depressed, Hopeless 0 0 0  PHQ - 2 Score 0 0 0  Altered sleeping   0  Tired, decreased energy   0  Change in appetite   0  Feeling bad or failure about yourself    0  Trouble concentrating   0  Moving slowly or fidgety/restless   0  Suicidal thoughts   0  PHQ-9 Score   0    Review of Systems  Constitutional:  Positive for unexpected weight change (weight gain).  Musculoskeletal:  Positive for gait problem.  Neurological:  Positive for dizziness, weakness and numbness.       Spasms  All other systems reviewed and are negative.      Objective:   Physical Exam Vitals and nursing note reviewed.  Constitutional:      Appearance: He is normal weight.  Eyes:     Extraocular Movements: Extraocular movements intact.     Conjunctiva/sclera: Conjunctivae normal.     Pupils: Pupils are equal, round, and reactive to light.     Comments: No nystagmus to left , horz nystagmus to Right   No eye closure problem    Skin:    General: Skin is warm and dry.  Neurological:     Mental Status: He is alert and oriented to person, place, and time.  Psychiatric:        Mood and Affect: Mood normal.        Behavior: Behavior normal.   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 Ambulation not tested no walker       Assessment & Plan:   1.  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

## 2022-05-16 ENCOUNTER — Other Ambulatory Visit (HOSPITAL_BASED_OUTPATIENT_CLINIC_OR_DEPARTMENT_OTHER): Payer: Self-pay | Admitting: Family Medicine

## 2022-05-16 ENCOUNTER — Other Ambulatory Visit: Payer: Self-pay | Admitting: Physical Medicine & Rehabilitation

## 2022-05-16 ENCOUNTER — Other Ambulatory Visit (HOSPITAL_BASED_OUTPATIENT_CLINIC_OR_DEPARTMENT_OTHER): Payer: Self-pay | Admitting: Nurse Practitioner

## 2022-05-16 ENCOUNTER — Ambulatory Visit: Payer: 59 | Attending: Family Medicine | Admitting: Physical Therapy

## 2022-05-16 DIAGNOSIS — R2689 Other abnormalities of gait and mobility: Secondary | ICD-10-CM | POA: Diagnosis present

## 2022-05-16 DIAGNOSIS — R278 Other lack of coordination: Secondary | ICD-10-CM | POA: Diagnosis present

## 2022-05-16 DIAGNOSIS — R2681 Unsteadiness on feet: Secondary | ICD-10-CM | POA: Diagnosis present

## 2022-05-16 DIAGNOSIS — M6281 Muscle weakness (generalized): Secondary | ICD-10-CM | POA: Diagnosis present

## 2022-05-16 NOTE — Therapy (Signed)
OUTPATIENT PHYSICAL THERAPY TREATMENT NOTE/ RE-EVALUATION   Patient Name: Maurice Little MRN: [id number] DOB:Mar 22, 1956, 66 y.o., male Today's Date: 05/17/2022  PCP: Tommi Rumps [place], Buren Kos, MD REFERRING PROVIDER: Charlton Amor, PA-C   PT End of Session - 05/17/22 1143     Visit Number 47   visit 1/9   Number of Visits 51    Date for PT Re-Evaluation 07/12/22   Recert   Authorization Type [employer] ([place] with no auth required)    PT Start Time 1020    PT Stop Time 1117    PT Time Calculation (min) 57 min    Equipment Utilized During Treatment Gait belt    Activity Tolerance Patient tolerated treatment well    Behavior During Therapy WFL for tasks assessed/performed                      Past Medical History:  Diagnosis Date   Benign prostatic hyperplasia with elevated prostate specific antigen (PSA)    Hypertension    Iliac artery stenosis, left (HCC)    with claudication resolved with stent   PAD (peripheral artery disease) (HCC)    Stroke [facility])    Past Surgical History:  Procedure Laterality Date   BUBBLE STUDY  08/31/2021   Procedure: BUBBLE STUDY;  Surgeon: Chilton Si, MD;  Location: [facility] ENDOSCOPY;  Service: Cardiovascular;;   ILIAC ARTERY STENT Left    IR ANGIO VERTEBRAL SEL SUBCLAVIAN INNOMINATE UNI L MOD SED  08/30/2021   IR ANGIO VERTEBRAL SEL SUBCLAVIAN INNOMINATE UNI R MOD SED  08/30/2021   IR CT HEAD LTD  08/30/2021   IR INTRA CRAN STENT  08/30/2021   IR RADIOLOGIST EVAL & MGMT  11/28/2021   IR RADIOLOGIST EVAL & MGMT  01/12/2022   IR US GUIDE VASC ACCESS RIGHT  08/30/2021   RADIOLOGY WITH ANESTHESIA N/A 08/30/2021   Procedure: IR WITH ANESTHESIA;  Surgeon: Julieanne Cotton, MD;  Location: [facility] OR;  Service: Radiology;  Laterality: N/A;   TEE WITHOUT CARDIOVERSION N/A 08/31/2021   Procedure: TRANSESOPHAGEAL ECHOCARDIOGRAM (TEE);  Surgeon: Chilton Si, MD;  Location: [facility] ENDOSCOPY;  Service: Cardiovascular;  Laterality: N/A;   Patient Active Problem  List   Diagnosis Date Noted   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   Cough 01/31/2022   Familial hypercholesteremia 11/06/2021   PFO (patent foramen ovale) 11/06/2021   Urinary retention 09/27/2021   Diplopia 09/27/2021   Carotid stenosis, right 09/27/2021   Anemia 09/27/2021   Slow transit constipation    Benign essential HTN    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   Cerebral embolism with cerebral infarction 08/30/2021   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) 08/30/2021   Basilar artery stenosis 08/29/2021    REFERRING DIAG: Cerebellar CVA  THERAPY DIAG:  Other abnormalities of gait and mobility  Unsteadiness on feet  Muscle weakness (generalized)  Other lack of coordination  PERTINENT HISTORY: Diplopia, basilar artery stenosis, PFO, cerebral infarction 08/2021, essential HTN  PRECAUTIONS: Fall  SUBJECTIVE:  Pt arrives for PT appt using motorized scooter; states "it's easier";  scooter was obtained when pt went on cruise in May  Pt has been on hold since 04-11-22 ; placed on hold to work on exercises at home - both land and pool; however, pt states he has not done land exercises due to those being challenging - has focused on doing exercises in his pool at home;  wife [name] states she has tried to incorporate balance activities with functional activities such as loading dishwasher at home  Pt reports that his walking and standing tolerance was limited by low back and hip pain when he was in PT before and states that is what impacted his endurance - states it wasn't the fact that he got tired (decreased endurance) but rather the fact that his low back would start hurting; this was not communicated to therapists in prior PT sessions  Message received last week from Dr. Wynn Banker to assess pt's low back pain and provide stretches for piriformis  PAIN:  Are you having pain?  No      Pt states the pain only occurs with prolonged standing or walking  TODAY'S TREATMENT:   Gait pattern: step to pattern, decreased step length- Left, decreased stance time- Right, decreased stride length, decreased hip/knee flexion- Right, decreased hip/knee flexion- Left, shuffling, trunk flexed, and poor foot clearance- Left Distance walked: 58' with RW with CGA Assistive device utilized: RW Level of assistance: CGA and Min A Comments: needs cues to decreased speed to increase balance; balance decreases with fatigue   Gait velocity:  19.38 secs 1st trial with CGA to min assist with RW; 16.62 secs with RW 2nd trial = 1.97 ft/sec   TUG score;  20.72 secs without device  TherEx:  see Medbridge HEP below - pt performed exercises for low back and Rt piriformis stretching  PATIENT EDUCATION: Education details: Medbridge HEP for piriformis and low back stretches: emphasized need to perform balance exercises on land & not just in pool  Person educated: Patient Education method: Programmer, multimedia, Demonstration, and Verbal cues Education comprehension: verbalized understanding and returned demonstration    HOME EXERCISE PROGRAM Access Code: [email or web address] URL: [email or web address] Date: 02/05/2022 Prepared by: Alethia Berthold Plaster  Exercises - Bird Dog  - 1 x daily - 7 x weekly - 3 sets - 10 reps - Tall to Half Kneeling w/RUE support on cane  - 1 x daily - 7 x weekly - 3 sets - 10 reps - Standing march with overhead reach  - 1 x daily - 7 x weekly - 3 sets - 10 reps  NEW HEP:   Medbridge  new HEP for low back and piriformis:  05-16-22 Access Code: [email or web address] URL: [email or web address] Date: 05/17/2022 Prepared by: Maebelle Munroe  Exercises - Supine Piriformis Stretch with Foot on Ground  - 1 x daily - 7 x weekly - 3 sets - 10 reps - Supine Piriformis Stretch Pulling Heel to Hip  - 1 x daily - 7 x weekly - 1 sets - 10 reps - Seated Piriformis Stretch  - 1 x daily -  7 x weekly - 1 sets - 3 reps - 20-30 hold - Piriformis Stretch with Pressue Back against Pool Wall  - 1 x daily - 7 x weekly - 3 sets - 10 reps - Cat to Child's Pose with Posterior Pelvic Tilt  - 1 x daily - 7 x weekly - 1 sets - 1 reps - 20 hold - Supine Posterior Pelvic Tilt  - 1 x daily - 7 x weekly - 1 sets - 3 reps - 3-5 hold - Supine Single Knee to Chest Stretch  - 1 x daily - 7 x weekly - 1 sets - 3 reps - 3-5 hold - Supine Double Knee to Chest  - 1 x daily - 7 x weekly - 1 sets - 3 reps - 15 hold - Supine Lower Trunk Rotation  -  1 x daily - 7 x weekly - 3 sets - 2 reps - 15 hold    GOALS   NEW LONG TERM GOALS FOR UPDATED POC:  Target date: 07-12-22 - 4 weeks goals - delay in start due to pt going on vacation for 2 weeks in August  Pt will amb. 350' with RW with SBA on flat, even surface for increased community accessibility.  Baseline:  460' w/RW and min assist with fatigue Goal status: Revised 05-16-22  2.  Pt will be modified independent with household amb. without use of assistive device.  Baseline: S*-CGA from wife, [name]; supervision to CGA needed due to balance deficits  Goal status: Ongoing   3.  Pt will improve gait velocity to at least 2.5 ft/s with LRAD mod I for improved gait efficiency and independence  Baseline: 2.03 ft/s w/RW on 03/01/22; 2.33 ft/s w/RW on 6/15 ;  16.62 with RW = 1.97 ft/sec with RW on 05-16-22 Goal status: Revised 05-16-22  4.  Pt will improve normal TUG to less than or equal to 17 seconds without RW with CGA for improved functional mobility and decreased fall risk.  Baseline:  21.84 secs without RW with min assist; 16.34s without AD and CGA 04-11-22:  20.72 secs without device on 05-16-22 Goal status: Revised 05-16-22  5.  Pt will be independent with final HEP and walking program for improved strength, balance and low back stretches.   Baseline:  Goal status: Revised 05-16-22  6.  Pt will demonstrate single leg stance of >/= 5s per side without UE  support for improved single leg stability, balance and ankle strategy  Baseline: 3s on LLE, 2s on RLE Goal status: Ongoing - 05-16-22  7.  Pt will improve standing tolerance so that he is able to stand for at least 10" with c/o back pain < 3/10 intensity.      Baseline:  pt reports back pain 5-6/10 with standing approx. 4-5"    Goal status:  NEW ASSESSMENT   Plan - 01/11/22 1045     Clinical Impression Statement Pt returns for PT re-evaluation after being placed on hold for 1 month to allow time to focus on exercises at home, both land and aquatic exercises as he has his own pool.  Pt arrives to PT session using motorized scooter rather than ambulating into clinic.  Pt presents with new report of increased low back pain that occurs with prolonged standing/walking and states this is what limits his activity tolerance, not actually decreased endurance as what was thought to be the reason for fatigue in prior sessions (back pain was not reported).  Pt's status remains grossly same as that at previous PT session on 04-11-22 with TUG score 20.72 secs without device (16.3 secs without device on 04-11-22) and gait velocity has slightly decreased from 2.33 ft/sec to 1.97 ft/sec with RW.  Cont for month to address low back pain with prolonged standing, gait and balance deficits.     Personal Factors and Comorbidities Comorbidity 2;Profession    Comorbidities basilar artery stenosis, dyslipidemia,  HTN, neurogenic bladder, diplopia, anemia, Lt iliac artery stenosis, PAD    Examination-Activity Limitations Bathing;Carry;Continence;Toileting;Dressing;Lift;Stand;Stairs;Squat;Reach Overhead;Locomotion Level;Transfers    Examination-Participation Restrictions Cleaning;Community Activity;Driving;Interpersonal Relationship;Laundry;Shop;Occupation;Yard Work;Meal Prep    Stability/Clinical Decision Making Evolving/Moderate complexity    Rehab Potential Good    PT Frequency 2x / week    PT Duration 4 weeks (recert)     PT Treatment/Interventions ADLs/Self Care Home Management;Aquatic Therapy;DME Instruction;Gait training;Stair training;Therapeutic activities;Therapeutic exercise;Balance  training;Neuromuscular re-education;Patient/family education;Vestibular    PT Next Visit Plan    Check low back and piriformis stretches; balance and coordination exercises on land   PT Home Exercise Plan [email or web address]    Consulted and Agree with Plan of Care Patient;Family member/caregiver    Family Member Consulted wife, Marcelle Smiling [email or web address],  05/17/22, 12:24 PM

## 2022-05-17 ENCOUNTER — Encounter: Payer: Self-pay | Admitting: Physical Therapy

## 2022-05-27 ENCOUNTER — Encounter (HOSPITAL_BASED_OUTPATIENT_CLINIC_OR_DEPARTMENT_OTHER): Payer: Self-pay | Admitting: Family Medicine

## 2022-05-27 ENCOUNTER — Other Ambulatory Visit: Payer: Self-pay | Admitting: Internal Medicine

## 2022-05-27 ENCOUNTER — Ambulatory Visit (INDEPENDENT_AMBULATORY_CARE_PROVIDER_SITE_OTHER): Payer: 59 | Admitting: Family Medicine

## 2022-05-27 DIAGNOSIS — Z0000 Encounter for general adult medical examination without abnormal findings: Secondary | ICD-10-CM

## 2022-05-27 MED ORDER — TICAGRELOR 90 MG PO TABS: 90.0000 mg | ORAL_TABLET | Freq: Two times a day (BID) | ORAL | 0 refills | Status: DC

## 2022-05-27 NOTE — Patient Instructions (Signed)
  Medication Instructions:  Your physician recommends that you continue on your current medications as directed. Please refer to the Current Medication list given to you today. --If you need a refill on any your medications before your next appointment, please call your pharmacy first. If no refills are authorized on file call the office.-- Lab Work: Your physician has recommended that you have lab work today: No If you have labs (blood work) drawn today and your tests are completely normal, you will receive your results via MyChart message OR a phone call from our staff.  Please ensure you check your voicemail in the event that you authorized detailed messages to be left on a delegated number. If you have any lab test that is abnormal or we need to change your treatment, we will call you to review the results.  Referrals/Procedures/Imaging: No  Follow-Up: Your next appointment:   Your physician recommends that you schedule a follow-up appointment in: 6 months with Dr. de [place]. Nurse visit for fasting labs.  You will receive a text message or e-mail with a link to a survey about your care and experience with [place] today! We would greatly appreciate your feedback!   Thanks for letting us be apart of your health journey!!  Primary Care and Sports Medicine   Dr. Ceasar Mons [place]   We encourage you to activate your patient portal called "MyChart".  Sign up information is provided on this After Visit Summary.  MyChart is used to connect with patients for Virtual Visits (Telemedicine).  Patients are able to view lab/test results, encounter notes, upcoming appointments, etc.  Non-urgent messages can be sent to your provider as well. To learn more about what you can do with MyChart, please visit --  [email or web address].

## 2022-05-27 NOTE — Progress Notes (Signed)
Subjective:    CC: Annual Physical Exam  HPI:  Maurice Little is a 66 y.o. presenting for annual physical  I reviewed the past medical history, family history, social history, surgical history, and allergies today and no changes were needed.  Please see the problem list section below in epic for further details.  Past Medical History: Past Medical History:  Diagnosis Date   Benign prostatic hyperplasia with elevated prostate specific antigen (PSA)    Hypertension    Iliac artery stenosis, left (HCC)    with claudication resolved with stent   PAD (peripheral artery disease) (HCC)    Stroke [facility])    Past Surgical History: Past Surgical History:  Procedure Laterality Date   BUBBLE STUDY  08/31/2021   Procedure: BUBBLE STUDY;  Surgeon: Chilton Si, MD;  Location: [facility] ENDOSCOPY;  Service: Cardiovascular;;   ILIAC ARTERY STENT Left    IR ANGIO VERTEBRAL SEL SUBCLAVIAN INNOMINATE UNI L MOD SED  08/30/2021   IR ANGIO VERTEBRAL SEL SUBCLAVIAN INNOMINATE UNI R MOD SED  08/30/2021   IR CT HEAD LTD  08/30/2021   IR INTRA CRAN STENT  08/30/2021   IR RADIOLOGIST EVAL & MGMT  11/28/2021   IR RADIOLOGIST EVAL & MGMT  01/12/2022   IR US GUIDE VASC ACCESS RIGHT  08/30/2021   RADIOLOGY WITH ANESTHESIA N/A 08/30/2021   Procedure: IR WITH ANESTHESIA;  Surgeon: Julieanne Cotton, MD;  Location: [facility] OR;  Service: Radiology;  Laterality: N/A;   TEE WITHOUT CARDIOVERSION N/A 08/31/2021   Procedure: TRANSESOPHAGEAL ECHOCARDIOGRAM (TEE);  Surgeon: Chilton Si, MD;  Location: [facility] ENDOSCOPY;  Service: Cardiovascular;  Laterality: N/A;   Social History: Social History   Socioeconomic History   Marital status: Married    Spouse name: Jody   Number of children: Not on file   Years of education: Not on file   Highest education level: Bachelor's degree (e.g., BA, AB, BS)  Occupational History   Not on file  Tobacco Use   Smoking status: Former    Types: Cigarettes   Smokeless tobacco: Never    Tobacco comments:    Quit 2000  Vaping Use   Vaping Use: Never used  Substance and Sexual Activity   Alcohol use: Not Currently   Drug use: Never   Sexual activity: Not on file  Other Topics Concern   Not on file  Social History Narrative   Lives with wife   Social Determinants of Health   Financial Resource Strain: Not on file  Food Insecurity: Not on file  Transportation Needs: Not on file  Physical Activity: Not on file  Stress: Not on file  Social Connections: Not on file   Family History: Family History  Problem Relation Age of Onset   Healthy Mother    Allergies: Allergies  Allergen Reactions   Duloxetine Tinitus    Other reaction(s): Other (See Comments) Dizziness Dizziness, tinnitus    Medications: See med rec.  Review of Systems: No headache, visual changes, nausea, vomiting, diarrhea, constipation, dizziness, abdominal pain, skin rash, fevers, chills, night sweats, swollen lymph nodes, weight loss, chest pain, body aches, joint swelling, muscle aches, shortness of breath, mood changes, visual or auditory hallucinations.  Objective:    BP 124/84   Pulse 65   Temp 97.8 F (36.6 C) (Oral)   Ht 6' (1.829 m)   Wt 237 lb 9.6 oz (107.8 kg)   SpO2 98%   BMI 32.22 kg/m   General: Well Developed, well nourished, and in no acute distress.  Neuro: Alert and oriented x3, extra-ocular muscles intact, sensation grossly intact. Cranial nerves II through XII are intact, motor, sensory, and coordinative functions are all intact. HEENT: Normocephalic, atraumatic, pupils equal round reactive to light, neck supple, no masses, no lymphadenopathy, thyroid nonpalpable. Oropharynx, nasopharynx, external ear canals are unremarkable. Skin: Warm and dry, no rashes noted. Cardiac: Regular rate and rhythm, no murmurs rubs or gallops. Respiratory: Clear to auscultation bilaterally. Not using accessory muscles, speaking in full sentences. Abdominal: Soft, nontender, nondistended,  positive bowel sounds, no masses, no organomegaly. Musculoskeletal: Shoulder, elbow, wrist, hip, knee, ankle stable, and with full range of motion.  Impression and Recommendations:    Wellness examination Routine HCM labs ordered. HCM reviewed/discussed. Anticipatory guidance regarding healthy weight, lifestyle and choices given. Recommend healthy diet.  Recommend approximately 150 minutes/week of moderate intensity exercise Recommend regular dental and vision exams Always use seatbelt/lap and shoulder restraints Recommend using smoke alarms and checking batteries at least twice a year Recommend using sunscreen when outside Discussed colon cancer screening recommendations, options.  Patient will consider and let us know how he would like to proceed Discussed recommendations for shingles vaccine.  Patient declines at this time Discussed tetanus immunization recommendations, patient declines today The natural history of prostate cancer and ongoing controversy regarding screening and potential treatment outcomes of prostate cancer has been discussed with the patient. The meaning of a false positive PSA and a false negative PSA has been discussed. He indicates understanding of the limitations of this screening test and wishes to proceed with screening PSA testing.  Some leg pain. Working with PT regarding piriformis pain source. Does have follow-up with IR specialist, can check with them regarding vascular specialist locally who may consider further evaluation of iliac stent as possible source.  Return in about 6 months (around 11/27/2022).   ___________________________________________ Teneil Shiller de [place], MD, ABFM, CAQSM Primary Care and Sports Medicine [facility]

## 2022-05-27 NOTE — Progress Notes (Signed)
Left VM for patient that his refill has been sent to [facility] in [place],  per request.     Alex Gardener, AGNP-BC 05/27/2022, 10:42 AM \

## 2022-05-27 NOTE — Assessment & Plan Note (Addendum)
Routine HCM labs ordered. HCM reviewed/discussed. Anticipatory guidance regarding healthy weight, lifestyle and choices given. Recommend healthy diet.  Recommend approximately 150 minutes/week of moderate intensity exercise Recommend regular dental and vision exams Always use seatbelt/lap and shoulder restraints Recommend using smoke alarms and checking batteries at least twice a year Recommend using sunscreen when outside Discussed colon cancer screening recommendations, options.  Patient will consider and let us know how he would like to proceed Discussed recommendations for shingles vaccine.  Patient declines at this time Discussed tetanus immunization recommendations, patient declines today The natural history of prostate cancer and ongoing controversy regarding screening and potential treatment outcomes of prostate cancer has been discussed with the patient. The meaning of a false positive PSA and a false negative PSA has been discussed. He indicates understanding of the limitations of this screening test and wishes to proceed with screening PSA testing.

## 2022-07-09 ENCOUNTER — Other Ambulatory Visit (HOSPITAL_COMMUNITY): Payer: Self-pay | Admitting: Interventional Radiology

## 2022-07-09 DIAGNOSIS — I771 Stricture of artery: Secondary | ICD-10-CM

## 2022-07-14 NOTE — Progress Notes (Unsigned)
Cardiology Office Note:    Date:  07/15/2022   ID:  Cleda Daub, DOB Jun 10, 1956, MRN [id number]  PCP:  de [place], Buren Kos, MD   [facility] Providers Cardiologist:  Christell Constant, MD     Referring MD: de [place], Raymond J, MD   CC: PAD  History of Present Illness:    Maurice Little is a 66 y.o. male with a hx of HTN, FH, PAD s/p Ilaiac Artery Stenosis (previously saw Dr. Lucky Cowboy Med with 2016 10 x 37 mm expandable stent to the left common iliac artery 95% stenosis.  20% right common iliac artery stenosis)  Had a 11/8//22 Cerebellar stroke with small PFO seen.   2023: had no AF on monitor; asymptomatic SVT. LDL at goal.  Patient asked for earlier follow up for worsening leg pain.  Patient notes that he is doing PT since November.   With walking on the track the limiting factor was leg pain. Feels bilateral claudication left worse than right. Had prior history of back pain and has has prior back injections.  Was considered for elective back surgery. Did not get it. And found to have PAD.  Last intervention 2016.  Past Medical History:  Diagnosis Date   Benign prostatic hyperplasia with elevated prostate specific antigen (PSA)    Hypertension    Iliac artery stenosis, left (HCC)    with claudication resolved with stent   PAD (peripheral artery disease) (HCC)    Stroke [facility])     Past Surgical History:  Procedure Laterality Date   BUBBLE STUDY  08/31/2021   Procedure: BUBBLE STUDY;  Surgeon: Chilton Si, MD;  Location: [facility] ENDOSCOPY;  Service: Cardiovascular;;   ILIAC ARTERY STENT Left    IR ANGIO VERTEBRAL SEL SUBCLAVIAN INNOMINATE UNI L MOD SED  08/30/2021   IR ANGIO VERTEBRAL SEL SUBCLAVIAN INNOMINATE UNI R MOD SED  08/30/2021   IR CT HEAD LTD  08/30/2021   IR INTRA CRAN STENT  08/30/2021   IR RADIOLOGIST EVAL & MGMT  11/28/2021   IR RADIOLOGIST EVAL & MGMT  01/12/2022   IR US GUIDE VASC ACCESS RIGHT  08/30/2021   RADIOLOGY WITH ANESTHESIA N/A 08/30/2021    Procedure: IR WITH ANESTHESIA;  Surgeon: Julieanne Cotton, MD;  Location: [facility] OR;  Service: Radiology;  Laterality: N/A;   TEE WITHOUT CARDIOVERSION N/A 08/31/2021   Procedure: TRANSESOPHAGEAL ECHOCARDIOGRAM (TEE);  Surgeon: Chilton Si, MD;  Location: [facility] ENDOSCOPY;  Service: Cardiovascular;  Laterality: N/A;    Current Medications: Current Meds  Medication Sig   acetaminophen (TYLENOL) 325 MG tablet Take 1-2 tablets (325-650 mg total) by mouth every 4 (four) hours as needed for mild pain.   amLODipine (NORVASC) 5 MG tablet TAKE 1 TABLET (5 MG TOTAL) BY MOUTH DAILY AFTER SUPPER.   artificial tears (LACRILUBE) OINT ophthalmic ointment Place into both eyes at bedtime. (Patient taking differently: Place into the right eye at bedtime.)   aspirin EC 81 MG tablet Take 1 tablet (81 mg total) by mouth daily. Swallow whole.   cilostazol (PLETAL) 100 MG tablet Take 1 tablet (100 mg total) by mouth 2 (two) times daily.   citalopram (CELEXA) 10 MG tablet TAKE 1 TABLET BY MOUTH EVERY DAY   Cyanocobalamin (B-12 PO) Take 1 tablet by mouth daily.   ezetimibe (ZETIA) 10 MG tablet Take 10 mg by mouth daily.   FLAREX 0.1 % ophthalmic suspension Place 1 drop into the right eye daily.   losartan (COZAAR) 25 MG tablet TAKE 1 TABLET (  25 MG TOTAL) BY MOUTH DAILY.   pantoprazole (PROTONIX) 40 MG tablet TAKE 1 TABLET BY MOUTH EVERY DAY   RESTASIS MULTIDOSE 0.05 % ophthalmic emulsion Place 1 drop into the right eye 2 (two) times daily.   rosuvastatin (CRESTOR) 40 MG tablet Take 1 tablet (40 mg total) by mouth daily.   senna-docusate (SENOKOT-S) 8.6-50 MG tablet Take 2 tablets by mouth 2 (two) times daily.   ticagrelor (BRILINTA) 90 MG TABS tablet Take 1 tablet (90 mg total) by mouth 2 (two) times daily.   TYRVAYA 0.03 MG/ACT SOLN Place 1 puff into both nostrils 2 (two) times daily.     Allergies:   Duloxetine   Social History   Socioeconomic History   Marital status: Married    Spouse name: Jody    Number of children: Not on file   Years of education: Not on file   Highest education level: Bachelor's degree (e.g., BA, AB, BS)  Occupational History   Not on file  Tobacco Use   Smoking status: Former    Types: Cigarettes   Smokeless tobacco: Never   Tobacco comments:    Quit 2000  Vaping Use   Vaping Use: Never used  Substance and Sexual Activity   Alcohol use: Not Currently   Drug use: Never   Sexual activity: Not on file  Other Topics Concern   Not on file  Social History Narrative   Lives with wife   Social Determinants of Health   Financial Resource Strain: Not on file  Food Insecurity: Not on file  Transportation Needs: Not on file  Physical Activity: Not on file  Stress: Not on file  Social Connections: Not on file    Social: Comes with wife no kids, Former saw Dr. Smith Robertao  Family History: The patient's family history includes Healthy in his mother. No prior history of CAD in family  ROS:   Please see the history of present illness.     All other systems reviewed and are negative.  EKGs/Labs/Other Studies Reviewed:    Transthoracic Echocardiogram: Date: 08/30/21 Results:  1. Left ventricular ejection fraction, by estimation, is 60 to 65%. The  left ventricle has normal function. The left ventricle has no regional  wall motion abnormalities. Left ventricular diastolic parameters are  consistent with Grade II diastolic  dysfunction (pseudonormalization).   2. Right ventricular systolic function is normal. The right ventricular  size is normal. Tricuspid regurgitation signal is inadequate for assessing  PA pressure.   3. The mitral valve is normal in structure. No evidence of mitral valve  regurgitation. No evidence of mitral stenosis.   4. The aortic valve is tricuspid. Aortic valve regurgitation is not  visualized. Mild aortic valve sclerosis is present, with no evidence of  aortic valve stenosis.   5. The inferior vena cava is normal in size with  greater than 50%  respiratory variability, suggesting right atrial pressure of 3 mmHg.   Transesophageal Echocardiogram: Date: 08/31/21 Results:  Moderate Bubbles ~ 8 beats in with moderate descending aortic atheroma  1. Left ventricular ejection fraction, by estimation, is 55 to 60%. The  left ventricle has normal function. The left ventricle has no regional  wall motion abnormalities.   2. Right ventricular systolic function is normal. The right ventricular  size is normal.   3. No left atrial/left atrial appendage thrombus was detected.   4. The mitral valve is normal in structure. Trivial mitral valve  regurgitation. No evidence of mitral stenosis.   5.  The aortic valve is tricuspid. Aortic valve regurgitation is not  visualized. No aortic stenosis is present.   6. There is Moderate (Grade III) atheroma plaque involving the descending  aorta.   7. The inferior vena cava is normal in size with greater than 50%  respiratory variability, suggesting right atrial pressure of 3 mmHg.   8. Agitated saline contrast bubble study was positive with shunting  observed within 3-6 cardiac cycles suggestive of interatrial shunt. There  is a small patent foramen ovale with predominantly right to left shunting  across the atrial septum.    Recent Labs: 09/05/2021: Magnesium 2.1 09/24/2021: BUN 15; Hemoglobin 10.9; Platelets 311; Potassium 4.5; Sodium 135 11/06/2021: ALT 16 12/26/2021: Creatinine, Ser 1.00  Recent Lipid Panel    Component Value Date/Time   CHOL 116 01/25/2022 0756   TRIG 56 01/25/2022 0756   HDL 55 01/25/2022 0756   CHOLHDL 2.1 01/25/2022 0756   CHOLHDL 5.5 08/29/2021 2340   VLDL 16 08/29/2021 2340   LDLCALC 48 01/25/2022 0756       Physical Exam:    VS:  BP 129/89   Pulse 72   Ht 6' (1.829 m)   Wt 243 lb (110.2 kg)   SpO2 98%   BMI 32.96 kg/m     Wt Readings from Last 3 Encounters:  07/15/22 243 lb (110.2 kg)  05/27/22 237 lb 9.6 oz (107.8 kg)  05/14/22 238  lb (108 kg)    Gen: No distress   Neck: No JVD, bilateral soft carotid bruit Ears: Bilateral Pilar Plate Sign Cardiac: No Rubs or Gallops, no murmur, regular rhythm, +2 radial pulses, no leg ulcers, +1 PT Respiratory: Clear to auscultation bilaterally, normal effort, normal  respiratory rate GI: Soft, nontender, non-distended  MS: No edema Integument: Skin feels warm Neuro:  At time of evaluation, alert and oriented to person/place/time/situation Psych: Normal mood and affect  ASSESSMENT:    1. PAD (peripheral artery disease) ([place])   2. PFO (patent foramen ovale)   3. Familial hypercholesteremia   4. Cerebellar stroke ([place])   5. 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 Keturah Barre) P-SVT (asymptomatic) PAD - worsening claudication - will start cilatazol - will get CTA run off for evaluation of prior stent - will send to Dr. Estanislado Pandy if PAD interventions is part of his practice, otherwise will send to a [facility] partner - BP at goal on current meds  - if symptoms of SVT, norvasc-> diltiazem - on DAPT per IR for 11/22 stenting, will continue until after PAD eval - continue zetia and statin LDL goal < 55, he is at goal  Keep current follow up        Medication Adjustments/Labs and Tests Ordered: Current medicines are reviewed at length with the patient today.  Concerns regarding medicines are outlined above.  Orders Placed This Encounter  Procedures   CT ABDOMEN PELVIS W CONTRAST   Basic metabolic panel   Meds ordered this encounter  Medications   cilostazol (PLETAL) 100 MG tablet    Sig: Take 1 tablet (100 mg total) by mouth 2 (two) times daily.    Dispense:  180 tablet    Refill:  3    Patient Instructions  Medication Instructions:  Your physician has recommended  you make the following change  in your medication:  START: cilostazol (Pletal) 100 mg by mouth twice daily  *If you need a refill on your cardiac medications before your next appointment, please call your pharmacy*   Lab Work: IN 1 WEEK- -BMP If you have labs (blood work) drawn today and your tests are completely normal, you will receive your results only by: MyChart Message (if you have MyChart) OR A paper copy in the mail If you have any lab test that is abnormal or we need to change your treatment, we will call you to review the results.   Testing/Procedures: Your physician has ordered you to have a CT abd/pelvis   Follow-Up: At [facility], you and your health needs are our priority.  As part of our continuing mission to provide you with exceptional heart care, we have created designated Provider Care Teams.  These Care Teams include your primary Cardiologist (physician) and Advanced Practice Providers (APPs -  Physician Assistants and Nurse Practitioners) who all work together to provide you with the care you need, when you need it.  We recommend signing up for the patient portal called "MyChart".  Sign up information is provided on this After Visit Summary.  MyChart is used to connect with patients for Virtual Visits (Telemedicine).  Patients are able to view lab/test results, encounter notes, upcoming appointments, etc.  Non-urgent messages can be sent to your provider as well.   To learn more about what you can do with MyChart, go to [email or web address].    Your next appointment:   6 month(s)  The format for your next appointment:   In Person  Provider:   Christell Constant, MD     Other Instructions   Important Information About Sugar         Signed, Christell Constant, MD  07/15/2022 5:06 PM    [facility]

## 2022-07-15 ENCOUNTER — Ambulatory Visit: Payer: 59 | Attending: Internal Medicine | Admitting: Internal Medicine

## 2022-07-15 ENCOUNTER — Encounter: Payer: Self-pay | Admitting: Internal Medicine

## 2022-07-15 VITALS — BP 129/89 | HR 72 | Ht 72.0 in | Wt 243.0 lb

## 2022-07-15 DIAGNOSIS — I639 Cerebral infarction, unspecified: Secondary | ICD-10-CM

## 2022-07-15 DIAGNOSIS — Q2112 Patent foramen ovale: Secondary | ICD-10-CM | POA: Diagnosis not present

## 2022-07-15 DIAGNOSIS — I739 Peripheral vascular disease, unspecified: Secondary | ICD-10-CM

## 2022-07-15 DIAGNOSIS — I63111 Cerebral infarction due to embolism of right vertebral artery: Secondary | ICD-10-CM

## 2022-07-15 DIAGNOSIS — E7801 Familial hypercholesterolemia: Secondary | ICD-10-CM | POA: Diagnosis not present

## 2022-07-15 MED ORDER — CILOSTAZOL 100 MG PO TABS: 100.0000 mg | ORAL_TABLET | Freq: Two times a day (BID) | ORAL | 3 refills | Status: DC

## 2022-07-15 NOTE — Patient Instructions (Signed)
Medication Instructions:  Your physician has recommended you make the following change in your medication:  START: cilostazol (Pletal) 100 mg by mouth twice daily  *If you need a refill on your cardiac medications before your next appointment, please call your pharmacy*   Lab Work: IN 1 [place] If you have labs (blood work) drawn today and your tests are completely normal, you will receive your results only by: [place] (if you have MyChart) OR A paper copy in the mail If you have any lab test that is abnormal or we need to change your treatment, we will call you to review the results.   Testing/Procedures: Your physician has ordered you to have a CT abd/pelvis   Follow-Up: At [facility], you and your health needs are our priority.  As part of our continuing mission to provide you with exceptional heart care, we have created designated Provider Care Teams.  These Care Teams include your primary Cardiologist (physician) and Advanced Practice Providers (APPs -  Physician Assistants and Nurse Practitioners) who all work together to provide you with the care you need, when you need it.  We recommend signing up for the patient portal called "MyChart".  Sign up information is provided on this After Visit Summary.  MyChart is used to connect with patients for Virtual Visits (Telemedicine).  Patients are able to view lab/test results, encounter notes, upcoming appointments, etc.  Non-urgent messages can be sent to your provider as well.   To learn more about what you can do with MyChart, go to [email or web address].    Your next appointment:   6 month(s)  The format for your next appointment:   In Person  Provider:   Werner Lean, MD     Other Instructions   Important Information About Sugar

## 2022-07-17 NOTE — Addendum Note (Signed)
Addended by: Drue Novel I on: 07/17/2022 07:00 PM   Modules accepted: Orders

## 2022-07-22 ENCOUNTER — Ambulatory Visit: Payer: 59 | Attending: Internal Medicine

## 2022-07-22 DIAGNOSIS — I739 Peripheral vascular disease, unspecified: Secondary | ICD-10-CM

## 2022-07-22 LAB — BASIC METABOLIC PANEL
BUN/Creatinine Ratio: 10 (ref 10–24)
BUN: 11 mg/dL (ref 8–27)
CO2: 21 mmol/L (ref 20–29)
Calcium: 9.7 mg/dL (ref 8.6–10.2)
Chloride: 104 mmol/L (ref 96–106)
Creatinine, Ser: 1.05 mg/dL (ref 0.76–1.27)
Glucose: 101 mg/dL — ABNORMAL HIGH (ref 70–99)
Potassium: 4.8 mmol/L (ref 3.5–5.2)
Sodium: 139 mmol/L (ref 134–144)
eGFR: 79 mL/min/{1.73_m2} (ref >59)

## 2022-07-23 ENCOUNTER — Ambulatory Visit (HOSPITAL_COMMUNITY)
Admission: RE | Admit: 2022-07-23 | Discharge: 2022-07-23 | Disposition: A | Discharge status: Home / Self Care | Payer: 59 | Source: Ambulatory Visit | Attending: Interventional Radiology | Admitting: Interventional Radiology

## 2022-07-23 ENCOUNTER — Other Ambulatory Visit: Payer: Self-pay | Admitting: Internal Medicine

## 2022-07-23 DIAGNOSIS — I771 Stricture of artery: Secondary | ICD-10-CM | POA: Insufficient documentation

## 2022-07-23 DIAGNOSIS — I739 Peripheral vascular disease, unspecified: Secondary | ICD-10-CM | POA: Insufficient documentation

## 2022-07-23 MED ORDER — IOHEXOL 350 MG/ML SOLN
100.0000 mL | Freq: Once | INTRAVENOUS | Status: AC | PRN
  Administered 2022-07-23: 100 mL via INTRAVENOUS

## 2022-07-23 MED ORDER — IOHEXOL 350 MG/ML SOLN
60.0000 mL | Freq: Once | INTRAVENOUS | Status: AC | PRN
  Administered 2022-07-23: 60 mL via INTRAVENOUS

## 2022-07-30 ENCOUNTER — Telehealth (HOSPITAL_COMMUNITY): Payer: Self-pay

## 2022-07-30 NOTE — Telephone Encounter (Signed)
Called pt regarding recent imaging, no answer, left vm. AW  

## 2022-08-05 ENCOUNTER — Other Ambulatory Visit (HOSPITAL_COMMUNITY): Payer: Self-pay | Admitting: Interventional Radiology

## 2022-08-05 DIAGNOSIS — I771 Stricture of artery: Secondary | ICD-10-CM

## 2022-08-12 ENCOUNTER — Ambulatory Visit (HOSPITAL_COMMUNITY): Admission: RE | Admit: 2022-08-12 | Payer: 59 | Source: Ambulatory Visit

## 2022-08-12 ENCOUNTER — Encounter (HOSPITAL_COMMUNITY): Payer: Self-pay

## 2022-08-16 ENCOUNTER — Other Ambulatory Visit (HOSPITAL_BASED_OUTPATIENT_CLINIC_OR_DEPARTMENT_OTHER): Payer: Self-pay | Admitting: Nurse Practitioner

## 2022-08-27 ENCOUNTER — Telehealth (HOSPITAL_COMMUNITY): Payer: Self-pay

## 2022-08-27 ENCOUNTER — Other Ambulatory Visit: Payer: Self-pay | Admitting: Radiology

## 2022-08-27 MED ORDER — TICAGRELOR 90 MG PO TABS: 90.0000 mg | ORAL_TABLET | Freq: Two times a day (BID) | ORAL | 3 refills | Status: DC

## 2022-08-27 NOTE — Telephone Encounter (Signed)
Pt's wife called for Brilinta refill. I've sent a message to our PA (Bruning) to call in a refill to the [facility] [place] listed in his chart. AW

## 2022-08-30 ENCOUNTER — Ambulatory Visit (HOSPITAL_COMMUNITY): Payer: 59

## 2022-09-05 ENCOUNTER — Ambulatory Visit (HOSPITAL_COMMUNITY)
Admission: RE | Admit: 2022-09-05 | Discharge: 2022-09-05 | Disposition: A | Discharge status: Home / Self Care | Payer: 59 | Source: Ambulatory Visit | Attending: Interventional Radiology | Admitting: Interventional Radiology

## 2022-09-05 DIAGNOSIS — I771 Stricture of artery: Secondary | ICD-10-CM

## 2022-09-09 NOTE — Progress Notes (Unsigned)
[facility] [address] [place]. [place] [address] [phone]       STROKE FOLLOW UP NOTE  Maurice Little Date of Birth:  [birth year] Medical Record Number:  [id number]   Reason for Referral: stroke follow up    SUBJECTIVE:   CHIEF COMPLAINT:  No chief complaint on file.   HPI:   Update 09/10/2022 JM: Patient returns for 64-month stroke follow-up.  Overall stable without new stroke/TIA symptoms.  Completed PT back in July ***.   Use of scooter outside of home.  Continue nerve palsy ***, followed by neuro-ophthalmology.  Did have repeat imaging by IR back in September which showed patent VA and BA stents and unchanged right ICA proximal stenosis at 60%.  He remains on Brilinta, aspirin, atorvastatin and Zetia.  Blood pressure well controlled.    History provided for reference purposes only Update 02/28/2022 JM: Patient returns for 22-month stroke follow-up accompanied by his wife.  Overall stable without new stroke/TIA symptoms. Completed OT back in March. Continues working with PT with right-sided ataxia and imbalance with continued gradual improvement.  Ambulates with RW, no recent falls.  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  Continues to tape eye shut at night, use of eye patch with car rides, and lubricant for dry eye although has been improving.  Looking at getting fitted with hearing aide 5/19 with [facility]. Does not continue lightheadedness. Bladder and bowel functioning normally.  Able to do majority of ADLs with only minimal assistance/supervision and gradually doing some IADLs.  Remains on aspirin, Brilinta, atorvastatin and Zetia, denies side effects. F/u with IR, repeat CTA 12/2021 showed stable appearance of VA and BA stent and improvement of b/l carotid stenosis, plans on repeat CTA in September.  Blood pressure today 118/76. Cardiology recently discontinued BP med, still on  amlodipine and losartan. Completed cardiac monitor which was negative for A-fib.  No further concerns at this time.   Initial visit 11/15/2021 JM: Maurice Little is being seen for initial hospital follow-up accompanied by his wife, Maurice Little.  Doing outpatient PT/OT for past 6 weeks - making progress  Eye patch for car rides and at night (at first difficulty closing all the way, denies this being an issue but does continue to use eye drops frequently for dry eye) Occasional diplopia especially when looking towards the right Complete Left side numbness - denies any weakness right facial numbness and hearing impairment with tinnitus Imbalance - improving.  Ambulates short distance with RW and w/c long distance. No recent falls No further urinary retention or incontinence.  Currently being treated for UTI No new stroke/TIA symptoms  Compliant on aspirin and Brilinta as well as atorvastatin and Zetia without side effects.  Blood pressure today 121/79.  As scheduled follow-up with Dr. Corliss Skains next week.  Currently wearing cardiac monitor.  Has initial eval scheduled with [facility] next month.  Currently awaiting to schedule ophthalmology consult.  No further concerns at this time.  Stroke admission 11-22 [name] is a 66 y.o. male with no known medical history who presented on 08/29/2021 with left-sided numbness, dizziness, headache diplopia.  Developed right facial droop, slurred speech and right hearing loss overnight.  Personally reviewed hospitalization pertinent progress notes, lab work and imaging.  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  CTA head/neck showed right VA occlusion, R PICA occlusion, mid BA high-grade stenosis vs thrombosis, right ICA 65% stenosis and L VA-PICA. IR s/p stenting of R VA and mid BA, severe 90% stenosis right ICA  proximal and 50% stenosis left ICA proximal.  Repeat MRI 11/5 showed increased size of right cerebellar infarct, new patchy right cerebellar infarcts in AICA territories, new infarcts in the right middle cerebral peduncle, right medulla, right and left pons and new punctate infarcts of left parietal occipital lobes. Episode of transient dizziness and blurred vision 11/8 likley orthostatic with repeat imaging showing new density in the medulla of unclear significant - possibly artifact.  EF 60 to 65%.  TEE EF 60 to 65%, no evidence of LA/LAA thrombus or mass although positive for PFO.  Recommended 30-day cardiac event monitor outpatient to rule out A. fib.  LDL 220.  A1c 5.5.  Placed on aspirin and Brilinta s/p stent with plans on follow-up outpatient and potential right ICA stenting.  Unstable BP placed on Cleviprex drip and placed on amlodipine and hydralazine.  Placed on atorvastatin 80 mg daily.  Therapy eval's recommended CIR for ongoing therapy needs with residual right facial droop, diplopia, right lateral gaze incomplete, right hearing loss, left-sided numbness, imbalance with RLE incoordination, and urinary retention.          PERTINENT IMAGING  Per hospitalization 08/30/2019 CT showed right cerebellar infarct CT head and neck right VA origin occlusion, reconstituted at distal V2.  Right PICA occlusion, mid basilar artery high-grade stenosis versus thrombosis, right ICA 65% stenosis, left VA ends at PICA MRI right cerebellar infarct, and right lower pontine infarct. IR right VA origin, mid to basilar artery prominent stenosis, status post stenting in both arteries.  Severe 90% stenosis right ICA proximal, 50% stenosis left ICA proximal. MRI repeat 11/5 demonstrates increased size of right cerebellar infarct, new patchy right cerebellar infarcts in AICA territors, new infarcts in right middle cerebellar peduncle, right medulla, right and left pons, new punctate infarcts in left parietal and  occipital lobes. 2D Echo EF 60 to 65% TEE performed 11/4, LVEF 60-65%No LA/LAAthrombus or mass.+PFO with R-->L shuntingAtherosclerosis of the descending aorta.  LDL 220 HgbA1c 5.5    ROS:   14 system review of systems performed and negative with exception of those listed in HPI  PMH:  Past Medical History:  Diagnosis Date   Benign prostatic hyperplasia with elevated prostate specific antigen (PSA)    Hypertension    Iliac artery stenosis, left (HCC)    with claudication resolved with stent   PAD (peripheral artery disease) (HCC)    Stroke (HCC)     PSH:  Past Surgical History:  Procedure Laterality Date   BUBBLE STUDY  08/31/2021   Procedure: BUBBLE STUDY;  Surgeon: Chilton Si, MD;  [facility] END[facility]COPY;  Service: Cardiovascular;;   ILIAC ARTERY STENT Left    IR ANGIO VERTEBRAL SEL SUBCLAVIAN INNOMINATE UNI L MOD SED  08/30/2021   IR ANGIO VERTEBRAL SEL SUBCLAVIAN INNOMINATE UNI R MOD SED  08/30/2021   IR CT HEAD LTD  08/30/2021   IR INTRA CRAN STENT  08/30/2021   IR RADIOLOGIST EVAL & MGMT  11/28/2021   IR RADIOLOGIST EVAL & MGMT  01/12/2022   IR US GUIDE VASC ACCESS RIGHT  08/30/2021   RADIOLOGY WITH ANESTHESIA N/A 08/30/2021   Procedure: IR WITH ANESTHESIA;  Surgeon: Julieanne Cotton, MD;  Location: [facility] OR;  Service: Radiology;  Laterality: N/A;   TEE WITHOUT CARDIOVERSION N/A 08/31/2021  Procedure: TRANSESOPHAGEAL ECHOCARDIOGRAM (TEE);  Surgeon: Chilton Siandolph, Tiffany, MD;  [facility];  Service: Cardiovascular;  Laterality: N/A;    Social History:  Social History   Socioeconomic History   Marital status: Married    Spouse name: Jody   Number of children: Not on file   Years of education: Not on file   Highest education level: Bachelor's degree (e.g., BA, AB, BS)  Occupational History   Not on file  Tobacco Use   Smoking status: Former    Types: Cigarettes   Smokeless tobacco: Never   Tobacco comments:    Quit 2000  Vaping Use   Vaping  Use: Never used  Substance and Sexual Activity   Alcohol use: Not Currently   Drug use: Never   Sexual activity: Not on file  Other Topics Concern   Not on file  Social History Narrative   Lives with wife   Social Determinants of Health   Financial Resource Strain: Not on file  Food Insecurity: Not on file  Transportation Needs: Not on file  Physical Activity: Not on file  Stress: Not on file  Social Connections: Not on file  Intimate Partner Violence: Not on file    Family History:  Family History  Problem Relation Age of Onset   Healthy Mother     Medications:   Current Outpatient Medications on File Prior to Visit  Medication Sig Dispense Refill   acetaminophen (TYLENOL) 325 MG tablet Take 1-2 tablets (325-650 mg total) by mouth every 4 (four) hours as needed for mild pain.     amLODipine (NORVASC) 5 MG tablet TAKE 1 TABLET (5 MG TOTAL) BY MOUTH DAILY AFTER SUPPER. 30 tablet 2   artificial tears (LACRILUBE) OINT ophthalmic ointment Place into both eyes at bedtime. (Patient taking differently: Place into the right eye at bedtime.) 5 g 0   aspirin EC 81 MG tablet Take 1 tablet (81 mg total) by mouth daily. Swallow whole. 300 tablet 11   cilostazol (PLETAL) 100 MG tablet Take 1 tablet (100 mg total) by mouth 2 (two) times daily. 180 tablet 3   citalopram (CELEXA) 10 MG tablet TAKE 1 TABLET BY MOUTH EVERY DAY 90 tablet 1   Cyanocobalamin (B-12 PO) Take 1 tablet by mouth daily.     ezetimibe (ZETIA) 10 MG tablet Take 10 mg by mouth daily.     FLAREX 0.1 % ophthalmic suspension Place 1 drop into the right eye daily.     losartan (COZAAR) 25 MG tablet TAKE 1 TABLET (25 MG TOTAL) BY MOUTH DAILY. 30 tablet 5   pantoprazole (PROTONIX) 40 MG tablet TAKE 1 TABLET BY MOUTH EVERY DAY 30 tablet 5   RESTASIS MULTIDOSE 0.05 % ophthalmic emulsion Place 1 drop into the right eye 2 (two) times daily.     rosuvastatin (CRESTOR) 40 MG tablet Take 1 tablet (40 mg total) by mouth daily. 90  tablet 3   senna-docusate (SENOKOT-S) 8.6-50 MG tablet Take 2 tablets by mouth 2 (two) times daily. 120 tablet 0   ticagrelor (BRILINTA) 90 MG TABS tablet Take 1 tablet (90 mg total) by mouth 2 (two) times daily. 180 tablet 3   TYRVAYA 0.03 MG/ACT SOLN Place 1 puff into both nostrils 2 (two) times daily.     No current facility-administered medications on file prior to visit.    Allergies:   Allergies  Allergen Reactions   Duloxetine Tinitus    Other reaction(s): Other (See Comments) Dizziness Dizziness, tinnitus  OBJECTIVE:  Physical Exam  There were no vitals filed for this visit.   There is no height or weight on file to calculate BMI. No results found.  General: well developed, well nourished, very pleasant middle-age Caucasian male, seated, in no evident distress Head: head normocephalic and atraumatic.   Neck: supple with no carotid or supraclavicular bruits Cardiovascular: regular rate and rhythm, no murmurs Musculoskeletal: no deformity Skin:  no rash/petichiae Vascular:  Normal pulses all extremities   Neurologic Exam Mental Status: Awake and fully alert. mild dysarthria with some hesitancy but understood without difficulty.  Oriented to place and time. Recent and remote memory intact. Attention span, concentration and fund of knowledge appropriate. Mood and affect appropriate.  Cranial Nerves: Pupils equal, briskly reactive to light. Extraocular movements full with no nystagmus.  Able to close both eyes now. Visual fields full to confrontation. Right sided hearing loss to finger rub, intact left ear.  Decreased facial sensation split down midline bilaterally L>R.  Mild right sided facial weakness (upper>lower).  Tongue, palate moves normally and symmetrically.  Motor: Normal bulk and tone. Normal strength in all tested extremity muscles Sensory.: decreased left hemibody sensory. Intact right side Coordination: Rapid alternating movements normal in all  extremities except slightly decreased right hand. Finger-to-nose RUE ataxia and heel-to-shin very slight RLE ataxia. Gait and Station: stands from seated position without difficulty. Stance is normal. Gait demonstrates ataxic gait with mild right ankle instability, slow cautious steps, use of RW. Tandem walk and heel-to-toe not attempted.  Reflexes: 1+ and symmetric. Toes downgoing.         ASSESSMENT: Maurice Little is a 66 y.o. 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 Vascular risk factors include carotid stenosis, HTN and HLD.      PLAN:  Multiple strokes (as above):  Residual deficit: left hemisensory impairment, right facial numbness with weakness, right ear hearing loss with tinnitus, gait impairment with imbalance, visual/eye impairment, and dysarthria.  Continue working with PT for hopeful recovery. Use of RW at all times unless otherwise instructed. Ensure follow-up with ophthalmology and audiology.  Continue aspirin 81 mg daily  and atorvastatin 80 mg daily and Zetia for secondary stroke prevention.   Cardiac monitor negative for atrial fibrillation Discussed secondary stroke prevention measures and importance of close PCP follow up for aggressive stroke risk factor management including BP goal<130/90, and HLD with LDL goal<70.  LDL 48 12/2021 I have gone over the pathophysiology of stroke, warning signs and symptoms, risk factors and their management in some detail with instructions to go to the closest emergency room for symptoms of concern.  Carotid stenosis:  VA occlusion, BA stenosis: S/p VA and BA  stenting on Brilinta per IR recommendations followed by Dr. Corliss Skains, CTA head/neck 12/2021 patent flow through R vertebrobasilar junction and BA portion CTA 12/2021 Right ICA 60% stenosis, left ICA 40% stenosis  CTA 08/2021 right R ICA 65%, L ICA 50% stenosis Plans on repeat CTA in September    Follow-up in 6 months or call earlier if needed   CC:  PCP: de [place], Buren Kos, MD    I spent 38 minutes of face-to-face and non-face-to-face time with patient  and wife.  This included previsit chart review, lab review, study review, electronic health record documentation, patient and wife education regarding prior stroke including etiology and residual deficits, secondary stroke prevention measures and importance of managing stroke risk factors and answered all other questions to patient and wife's satisfaction  Ihor Austin, AGNP-BC  [facility] [address] [place], [place] [address]  Phone [phone] Fax [phone] Note: This document was prepared with digital dictation and possible smart phrase technology. Any transcriptional errors that result from this process are unintentional.

## 2022-09-10 ENCOUNTER — Ambulatory Visit (INDEPENDENT_AMBULATORY_CARE_PROVIDER_SITE_OTHER): Payer: 59 | Admitting: Adult Health

## 2022-09-10 ENCOUNTER — Encounter: Payer: Self-pay | Admitting: Adult Health

## 2022-09-10 VITALS — BP 126/82 | HR 69 | Ht 72.0 in | Wt 250.0 lb

## 2022-09-10 DIAGNOSIS — I6350 Cerebral infarction due to unspecified occlusion or stenosis of unspecified cerebral artery: Secondary | ICD-10-CM | POA: Diagnosis not present

## 2022-09-10 DIAGNOSIS — G510 Bell's palsy: Secondary | ICD-10-CM | POA: Diagnosis not present

## 2022-09-10 DIAGNOSIS — I639 Cerebral infarction, unspecified: Secondary | ICD-10-CM | POA: Diagnosis not present

## 2022-09-10 NOTE — Patient Instructions (Signed)
Continue to do exercises at home as advised by PT  Continue aspirin 81 mg daily  and atorvastatin and Zetia for secondary stroke prevention  Continue Brilinta per IR recommendations with routine follow-up  Continue to follow up with PCP regarding blood pressure and cholesterol management  Maintain strict control of hypertension with blood pressure goal below 130/90 and cholesterol with LDL cholesterol (bad cholesterol) goal below 70 mg/dL.   Signs of a Stroke? Follow the BEFAST method:  Balance Watch for a sudden loss of balance, trouble with coordination or vertigo Eyes Is there a sudden loss of vision in one or both eyes? Or double vision?  Face: Ask the person to smile. Does one side of the face droop or is it numb?  Arms: Ask the person to raise both arms. Does one arm drift downward? Is there weakness or numbness of a leg? Speech: Ask the person to repeat a simple phrase. Does the speech sound slurred/strange? Is the person confused ? Time: If you observe any of these signs, call [phone].    As you have been doing well from a stroke standpoint and closely followed by your primary doctor to manage stroke risk factors, you can follow up with [place] as needed at this time. Please call with any questions or concerns or possible need of follow up visit in the future! Keep up the good work!!        Thank you for coming to see [place] at [facility]. I hope we have been able to provide you high quality care today.  You may receive a patient satisfaction survey over the next few weeks. We would appreciate your feedback and comments so that we may continue to improve ourselves and the health of our patients.

## 2022-10-08 ENCOUNTER — Encounter: Payer: Self-pay | Admitting: Physical Medicine & Rehabilitation

## 2022-10-19 ENCOUNTER — Other Ambulatory Visit: Payer: Self-pay | Admitting: Physical Medicine and Rehabilitation

## 2022-10-23 ENCOUNTER — Other Ambulatory Visit: Payer: Self-pay | Admitting: Physical Medicine and Rehabilitation

## 2022-10-29 ENCOUNTER — Encounter: Payer: Self-pay | Admitting: Adult Health

## 2022-10-29 ENCOUNTER — Encounter: Payer: Self-pay | Admitting: Physical Medicine & Rehabilitation

## 2022-10-29 ENCOUNTER — Encounter: Payer: Self-pay | Admitting: Internal Medicine

## 2022-10-29 ENCOUNTER — Encounter (HOSPITAL_BASED_OUTPATIENT_CLINIC_OR_DEPARTMENT_OTHER): Payer: Self-pay | Admitting: Family Medicine

## 2022-10-30 NOTE — Telephone Encounter (Signed)
Printed Release for pts records. Called pt and let him know they are ready. Received [employer] release on 10/24/22 and sent off to them same day. His records will be at the front with pts name on them for him to pick up.

## 2022-10-30 NOTE — Telephone Encounter (Signed)
Can you help with this?

## 2022-11-08 ENCOUNTER — Encounter: Payer: 59 | Admitting: Physical Medicine & Rehabilitation

## 2022-11-14 ENCOUNTER — Ambulatory Visit: Payer: 59 | Admitting: Physical Medicine & Rehabilitation

## 2022-11-15 ENCOUNTER — Encounter (HOSPITAL_BASED_OUTPATIENT_CLINIC_OR_DEPARTMENT_OTHER): Payer: Self-pay | Admitting: Family Medicine

## 2022-11-15 ENCOUNTER — Other Ambulatory Visit: Payer: Self-pay | Admitting: Physical Medicine & Rehabilitation

## 2022-11-15 ENCOUNTER — Other Ambulatory Visit (HOSPITAL_BASED_OUTPATIENT_CLINIC_OR_DEPARTMENT_OTHER): Payer: Self-pay | Admitting: Family Medicine

## 2022-11-15 ENCOUNTER — Other Ambulatory Visit: Payer: Self-pay | Admitting: Internal Medicine

## 2022-11-18 ENCOUNTER — Other Ambulatory Visit (HOSPITAL_BASED_OUTPATIENT_CLINIC_OR_DEPARTMENT_OTHER): Payer: Self-pay | Admitting: Family Medicine

## 2022-11-26 ENCOUNTER — Ambulatory Visit (INDEPENDENT_AMBULATORY_CARE_PROVIDER_SITE_OTHER): Payer: Self-pay | Admitting: Family Medicine

## 2022-11-26 ENCOUNTER — Encounter (HOSPITAL_BASED_OUTPATIENT_CLINIC_OR_DEPARTMENT_OTHER): Payer: Self-pay | Admitting: Family Medicine

## 2022-11-26 DIAGNOSIS — I10 Essential (primary) hypertension: Secondary | ICD-10-CM

## 2022-11-26 DIAGNOSIS — R058 Other specified cough: Secondary | ICD-10-CM

## 2022-11-26 NOTE — Assessment & Plan Note (Addendum)
Taking amlodipine and losartan as prescribed.  Denies chest pain, shortness of breath, lower extremity edema, vision changes, headaches.  Labs were ordered in July 2023 office visit- patient did not have those drawn.  He will schedule a nurse visit for labs.  Blood pressure is well-controlled based on previous office visits, he does not check at home.  He will continue amlodipine 5 mg daily and losartan 25 mg daily.  No refills needed.

## 2022-11-26 NOTE — Assessment & Plan Note (Signed)
Briefly discussed occasional cough. Denies recent fever or illnesses. Recommend OTC medications for high blood pressure, such as Coricidin. Will follow-up if symptoms do not resolve.

## 2022-11-26 NOTE — Progress Notes (Signed)
Established Patient Office Visit I connected with  [name] on 11/26/22 by a video enabled telemedicine application and verified that I am speaking with the correct person using two identifiers.   I discussed the limitations of evaluation and management by telemedicine. The patient expressed understanding and agreed to proceed. Virtual Visit via Telephone Note  I connected with [name] on 11/26/22 at  8:30 AM EST by telephone and verified that I am speaking with the correct person using two identifiers.  Location: Patient: home Provider: office   I discussed the limitations, risks, security and privacy concerns of performing an evaluation and management service by telephone and the availability of in person appointments. I also discussed with the patient that there may be a patient responsible charge related to this service. The patient expressed understanding and agreed to proceed.   History of Present Illness:    Observations/Objective:   Assessment and Plan:   Follow Up Instructions:    I discussed the assessment and treatment plan with the patient. The patient was provided an opportunity to ask questions and all were answered. The patient agreed with the plan and demonstrated an understanding of the instructions.   The patient was advised to call back or seek an in-person evaluation if the symptoms worsen or if the condition fails to improve as anticipated.  I provided 20 minutes of non-face-to-face time during this encounter.   Chalmers Guest, FNP  Subjective   Patient ID: Maurice Little, male    DOB: 1956-09-02  Age: 67 y.o. MRN: [id number]  Chief Complaint  Patient presents with   Follow-up    Pt here for f/u     HPI  Following up for HTN.  Sees Neurologist for stroke: last on 09/10/22.  Urology yearly, not needing to catheterize self for 6 months. Recently saw and got PSA- normal per patient.  Will schedule nurse visit for labs and will review in My Chart,  will schedule follow-up if needed to discuss.   Hypertension Medication compliance: taking amlodipine and losartan as prescribed Denies chest pain, shortness of breath, lower extremity edema, vision changes, headaches.  Pertinent lab work: ordered not drawn  Monitoring: checks at home: does not check BP on regular basis, does not have any numbers. Chart review BP is well controlled.  Tolerating medication well: yes Continue current medication regimen: amlodipine 5 mg and losartan 25 mg daily  Follow-up: 6 months No refills needed today. Needs nurse visit for labs.     Review of Systems  Constitutional:  Negative for chills and fever.  Eyes:  Negative for blurred vision and double vision.  Respiratory:  Positive for cough (occasional cough). Negative for shortness of breath.   Cardiovascular:  Negative for chest pain and leg swelling.  Neurological:  Negative for headaches.      Objective:     There were no vitals taken for this visit. BP Readings from Last 3 Encounters:  09/10/22 126/82  07/15/22 129/89  05/27/22 124/84      Physical Exam Vitals reviewed: no PE, phone visit.      No results found for any visits on 11/26/22.   The ASCVD Risk score (Arnett DK, et al., 2019) failed to calculate for the following reasons:   The patient has a prior MI or stroke diagnosis    Assessment & Plan:   Problem List Items Addressed This Visit     Essential hypertension - Primary    Taking amlodipine and losartan as prescribed.  Denies chest pain, shortness of breath, lower extremity edema, vision changes, headaches.  Labs were ordered in July 2023 office visit- patient did not have those drawn.  He will schedule a nurse visit for labs.  Blood pressure is well-controlled based on previous office visits, he does not check at home.  He will continue amlodipine 5 mg daily and losartan 25 mg daily.  No refills needed.      Cough    Briefly discussed occasional cough. Denies  recent fever or illnesses. Recommend OTC medications for high blood pressure, such as Coricidin. Will follow-up if symptoms do not resolve.      Agrees with plan of care discussed.  Questions answered.   Return in about 3 months (around 02/25/2023) for HTN.    Chalmers Guest, FNP

## 2022-11-27 ENCOUNTER — Telehealth (HOSPITAL_BASED_OUTPATIENT_CLINIC_OR_DEPARTMENT_OTHER): Payer: 59 | Admitting: Family Medicine

## 2022-12-07 LAB — COMPREHENSIVE METABOLIC PANEL
ALT: 22 IU/L (ref 0–44)
AST: 22 IU/L (ref 0–40)
Albumin/Globulin Ratio: 2.1 (ref 1.2–2.2)
Albumin: 4.7 g/dL (ref 3.9–4.9)
Alkaline Phosphatase: 71 IU/L (ref 44–121)
BUN/Creatinine Ratio: 14 (ref 10–24)
BUN: 16 mg/dL (ref 8–27)
Bilirubin Total: 0.7 mg/dL (ref 0.0–1.2)
CO2: 19 mmol/L — ABNORMAL LOW (ref 20–29)
Calcium: 9.3 mg/dL (ref 8.6–10.2)
Chloride: 104 mmol/L (ref 96–106)
Creatinine, Ser: 1.14 mg/dL (ref 0.76–1.27)
Globulin, Total: 2.2 g/dL (ref 1.5–4.5)
Glucose: 121 mg/dL — ABNORMAL HIGH (ref 70–99)
Potassium: 4.3 mmol/L (ref 3.5–5.2)
Sodium: 138 mmol/L (ref 134–144)
Total Protein: 6.9 g/dL (ref 6.0–8.5)
eGFR: 71 mL/min/{1.73_m2} (ref >59)

## 2022-12-07 LAB — CBC WITH DIFFERENTIAL/PLATELET
Basophils Absolute: 0.1 10*3/uL (ref 0.0–0.2)
Basos: 1 %
EOS (ABSOLUTE): 1.1 10*3/uL — ABNORMAL HIGH (ref 0.0–0.4)
Eos: 14 %
Hematocrit: 43.7 % (ref 37.5–51.0)
Hemoglobin: 14.1 g/dL (ref 13.0–17.7)
Immature Grans (Abs): 0 10*3/uL (ref 0.0–0.1)
Immature Granulocytes: 0 %
Lymphocytes Absolute: 2.5 10*3/uL (ref 0.7–3.1)
Lymphs: 31 %
MCH: 30.3 pg (ref 26.6–33.0)
MCHC: 32.3 g/dL (ref 31.5–35.7)
MCV: 94 fL (ref 79–97)
Monocytes Absolute: 0.6 10*3/uL (ref 0.1–0.9)
Monocytes: 7 %
Neutrophils Absolute: 3.7 10*3/uL (ref 1.4–7.0)
Neutrophils: 47 %
Platelets: 253 10*3/uL (ref 150–450)
RBC: 4.65 x10E6/uL (ref 4.14–5.80)
RDW: 13.3 % (ref 11.6–15.4)
WBC: 8 10*3/uL (ref 3.4–10.8)

## 2022-12-07 LAB — LIPID PANEL
Chol/HDL Ratio: 2.8 ratio (ref 0.0–5.0)
Cholesterol, Total: 100 mg/dL (ref 100–199)
HDL: 36 mg/dL — ABNORMAL LOW (ref >39)
LDL Chol Calc (NIH): 45 mg/dL (ref 0–99)
Triglycerides: 102 mg/dL (ref 0–149)
VLDL Cholesterol Cal: 19 mg/dL (ref 5–40)

## 2022-12-07 LAB — PSA TOTAL (REFLEX TO FREE): Prostate Specific Ag, Serum: 3.1 ng/mL (ref 0.0–4.0)

## 2022-12-07 LAB — HEMOGLOBIN A1C
Est. average glucose Bld gHb Est-mCnc: 123 mg/dL
Hgb A1c MFr Bld: 5.9 % — ABNORMAL HIGH (ref 4.8–5.6)

## 2022-12-09 ENCOUNTER — Encounter (HOSPITAL_BASED_OUTPATIENT_CLINIC_OR_DEPARTMENT_OTHER): Payer: Self-pay

## 2022-12-09 ENCOUNTER — Encounter (HOSPITAL_BASED_OUTPATIENT_CLINIC_OR_DEPARTMENT_OTHER): Payer: Self-pay | Admitting: Family Medicine

## 2022-12-10 ENCOUNTER — Encounter
Payer: BC Managed Care – PPO | Attending: Physical Medicine & Rehabilitation | Admitting: Physical Medicine & Rehabilitation

## 2022-12-10 ENCOUNTER — Ambulatory Visit (HOSPITAL_BASED_OUTPATIENT_CLINIC_OR_DEPARTMENT_OTHER): Payer: Self-pay

## 2022-12-10 ENCOUNTER — Encounter: Payer: Self-pay | Admitting: Physical Medicine & Rehabilitation

## 2022-12-10 VITALS — BP 149/89 | HR 68 | Ht 72.0 in

## 2022-12-10 DIAGNOSIS — Z0000 Encounter for general adult medical examination without abnormal findings: Secondary | ICD-10-CM

## 2022-12-10 DIAGNOSIS — G463 Brain stem stroke syndrome: Secondary | ICD-10-CM | POA: Diagnosis present

## 2022-12-10 DIAGNOSIS — I639 Cerebral infarction, unspecified: Secondary | ICD-10-CM | POA: Diagnosis present

## 2022-12-10 NOTE — Progress Notes (Signed)
Subjective:    Patient ID: Maurice Little, male    DOB: 1956-02-24, 67 y.o.   MRN: [id number]  HPI 67 year old male with right cerebellar, right ponto-medullary infarcts with chronic dizziness, ataxia and gait disturbance.  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  CLINICAL DATA:  Stroke, follow-up. 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   EXAM: MRI HEAD WITHOUT CONTRAST   TECHNIQUE: Multiplanar, multiecho pulse sequences of the brain and surrounding structures were obtained without intravenous contrast.   COMPARISON:  Head MRI 08/29/2021   FINDINGS: A limited examination was performed at the request of the ordering provider consisting of axial and coronal diffusion weighted imaging and axial susceptibility weighted imaging.   There is a large acute infarct in the right cerebellar hemisphere in the PICA territory which has increased in size from the prior MRI. Additional new patchy acute right cerebellar infarcts are present in the AICA territory, and there are also new and larger acute infarcts involving the right middle cerebellar peduncle, right medulla, and right greater than left pons. New punctate acute infarcts are noted in the medial aspects of the left parietal and left occipital lobes. There is a small amount of petechial hemorrhage associated with the right PICA infarct.   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     Electronically Signed   By: Logan Bores M.D.   On: 09/01/2021 19:52   Pain Inventory Average Pain 0 Pain Right Now 0 My pain is  No  pain  LOCATION OF PAIN  N/A  BOWEL Number of stools per week: 7   BLADDER Normal    Mobility walk with assistance use a walker how many minutes can you walk? 5 ability to climb steps?  yes do you drive?  no use a wheelchair transfers alone  Function not employed: date last employed 10/27/22 I need assistance with the following:  meal prep, household duties, and shopping  Neuro/Psych numbness  Prior Studies Any changes since last visit?  no  Physicians involved in your care Any changes since last visit?  no   Family History  Problem Relation Age of Onset   Healthy Mother    Social History   Socioeconomic History   Marital status: Married    Spouse name: Jody   Number of children: Not on file   Years of education: Not on file   Highest education level: Bachelor's degree (e.g., BA, AB, BS)  Occupational History   Not on file  Tobacco Use   Smoking status: Former    Types: Cigarettes   Smokeless tobacco: Never   Tobacco comments:    Quit 2000  Vaping Use   Vaping Use: Never used  Substance and Sexual Activity   Alcohol use: Not Currently   Drug use: Never   Sexual activity: Not on file  Other Topics Concern   Not on file  Social History Narrative   Lives with wife   Social Determinants of Health   Financial Resource Strain: Not on file  Food Insecurity: Not on file  Transportation Needs: Not on file  Physical Activity: Not on  file  Stress: Not on file  Social Connections: Not on file   Past Surgical History:  Procedure Laterality Date   BUBBLE STUDY  08/31/2021   Procedure: BUBBLE STUDY;  Surgeon: Skeet Latch, MD;  Location: [place];  Service: Cardiovascular;;   ILIAC ARTERY STENT Left    IR ANGIO VERTEBRAL SEL SUBCLAVIAN INNOMINATE UNI L MOD SED  08/30/2021   IR ANGIO VERTEBRAL SEL SUBCLAVIAN INNOMINATE UNI R MOD SED  08/30/2021   IR CT HEAD LTD  08/30/2021   IR INTRA CRAN STENT  08/30/2021   IR RADIOLOGIST EVAL & MGMT   11/28/2021   IR RADIOLOGIST EVAL & MGMT  01/12/2022   IR US GUIDE VASC ACCESS RIGHT  08/30/2021   RADIOLOGY WITH ANESTHESIA N/A 08/30/2021   Procedure: IR WITH ANESTHESIA;  Surgeon: Luanne Bras, MD;  Location: [place];  Service: Radiology;  Laterality: N/A;   TEE WITHOUT CARDIOVERSION N/A 08/31/2021   Procedure: TRANSESOPHAGEAL ECHOCARDIOGRAM (TEE);  Surgeon: Skeet Latch, MD;  Location: [place];  Service: Cardiovascular;  Laterality: N/A;   Past Medical History:  Diagnosis Date   Benign prostatic hyperplasia with elevated prostate specific antigen (PSA)    Hypertension    Iliac artery stenosis, left ([place])    with claudication resolved with stent   PAD (peripheral artery disease) ([place])    Stroke ([place])    Ht 6' (1.829 m)   BMI 33.91 kg/m   Opioid Risk Score:   Fall Risk Score:  `1  Depression screen [facility] 2/9     12/10/2022   10:47 AM 11/26/2022    8:40 AM 05/27/2022    2:33 PM 05/14/2022   10:04 AM 02/08/2022   10:19 AM 11/12/2021    1:33 PM 10/08/2021    9:06 AM  Depression screen PHQ 2/9  Decreased Interest 0 0 0 0 0 0 0  Down, Depressed, Hopeless 0 0 0 0 0 0 0  PHQ - 2 Score 0 0 0 0 0 0 0  Altered sleeping  0 0    0  Tired, decreased energy  0 0    0  Change in appetite  0 0    0  Feeling bad or failure about yourself   0 0    0  Trouble concentrating  0 0    0  Moving slowly or fidgety/restless  0 0    0  Suicidal thoughts  0 0    0  PHQ-9 Score  0 0    0  Difficult doing work/chores  Not difficult at all Not difficult at all          Review of Systems  All other systems reviewed and are negative.     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 Right shoulder mildly positive impingement sign Left shoulder without pain Gait not tested, walker not available Sit to stand with supervision assistance mild truncal ataxia Standing balance is fair  Back has mild tenderness palpation left lumbar paraspinals no evidence of increased tone or muscle spasm on the left side however the right side has some hypertonicity in the paraspinal area Negative straight leg raising bilaterally No pain going from sit to stand.  Assessment & Plan:   1.  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  Deficits stable  2.  Low back pain, patient has no clear-cut radicular symptoms he does have pain with ambulating for longer distances.  At this point he is not sure whether he would like further workup.  This would consist of x-rays possibly advanced imaging versus injections.  He will call if he would like to proceed with this.  Situation is complicated by his neurologic deficits.  Fortunately he has had vascular imaging to rule out any peripheral artery disease symptoms.  Over 7mn visit discussing his neurologic symptoms as well as newer LLE symptoms.  DIscussed plan with pt and wife

## 2022-12-10 NOTE — Patient Instructions (Signed)
Please call if you'd lie back xrays

## 2022-12-12 ENCOUNTER — Other Ambulatory Visit: Payer: Self-pay | Admitting: Physical Medicine & Rehabilitation

## 2022-12-21 ENCOUNTER — Encounter (HOSPITAL_BASED_OUTPATIENT_CLINIC_OR_DEPARTMENT_OTHER): Payer: Self-pay | Admitting: Family Medicine

## 2022-12-21 ENCOUNTER — Other Ambulatory Visit (HOSPITAL_BASED_OUTPATIENT_CLINIC_OR_DEPARTMENT_OTHER): Payer: Self-pay | Admitting: Family Medicine

## 2022-12-21 ENCOUNTER — Encounter: Payer: Self-pay | Admitting: Internal Medicine

## 2022-12-21 ENCOUNTER — Encounter: Payer: Self-pay | Admitting: Physical Medicine & Rehabilitation

## 2022-12-23 ENCOUNTER — Other Ambulatory Visit: Payer: Self-pay | Admitting: Physician Assistant

## 2022-12-23 ENCOUNTER — Telehealth (HOSPITAL_COMMUNITY): Payer: Self-pay

## 2022-12-23 ENCOUNTER — Other Ambulatory Visit: Payer: Self-pay | Admitting: Family Medicine

## 2022-12-23 ENCOUNTER — Other Ambulatory Visit (HOSPITAL_COMMUNITY): Payer: Self-pay

## 2022-12-23 ENCOUNTER — Other Ambulatory Visit: Payer: Self-pay

## 2022-12-23 MED ORDER — RESTASIS MULTIDOSE 0.05 % OP EMUL: 1.0000 [drp] | Freq: Two times a day (BID) | OPHTHALMIC | 0 refills | Status: DC

## 2022-12-23 MED ORDER — ROSUVASTATIN CALCIUM 40 MG PO TABS: 40.0000 mg | ORAL_TABLET | Freq: Every day | ORAL | 3 refills | Status: DC

## 2022-12-23 MED ORDER — TICAGRELOR 90 MG PO TABS: 90.0000 mg | ORAL_TABLET | Freq: Two times a day (BID) | ORAL | 3 refills | Status: DC

## 2022-12-23 MED ORDER — EZETIMIBE 10 MG PO TABS: 10.0000 mg | ORAL_TABLET | Freq: Every day | ORAL | 3 refills | Status: DC

## 2022-12-23 MED ORDER — ROSUVASTATIN CALCIUM 40 MG PO TABS: 40.0000 mg | ORAL_TABLET | Freq: Every day | ORAL | 11 refills | Status: DC

## 2022-12-23 MED ORDER — CITALOPRAM HYDROBROMIDE 10 MG PO TABS: 10.0000 mg | ORAL_TABLET | Freq: Every day | ORAL | 2 refills | Status: DC

## 2022-12-23 MED ORDER — RESTASIS MULTIDOSE 0.05 % OP EMUL
1.0000 [drp] | Freq: Two times a day (BID) | OPHTHALMIC | 3 refills | Status: DC
  Filled 2022-12-23: qty 5.5, 7d supply, fill #0

## 2022-12-23 MED ORDER — EZETIMIBE 10 MG PO TABS: 10.0000 mg | ORAL_TABLET | Freq: Every day | ORAL | 11 refills | Status: DC

## 2022-12-23 NOTE — Addendum Note (Signed)
Addended by: Candiss Norse A on: 12/23/2022 01:04 PM   Modules accepted: Orders

## 2022-12-23 NOTE — Telephone Encounter (Signed)
Pt's wife called for a refill of Brilinta called into the [facility] in [place]. I have sent a message to our PA to call this in. AB

## 2022-12-24 ENCOUNTER — Other Ambulatory Visit (HOSPITAL_BASED_OUTPATIENT_CLINIC_OR_DEPARTMENT_OTHER): Payer: Self-pay

## 2022-12-24 DIAGNOSIS — I10 Essential (primary) hypertension: Secondary | ICD-10-CM

## 2022-12-24 MED ORDER — AMLODIPINE BESYLATE 5 MG PO TABS: 5.0000 mg | ORAL_TABLET | Freq: Every day | ORAL | 2 refills | Status: DC

## 2022-12-24 MED ORDER — LOSARTAN POTASSIUM 25 MG PO TABS: 25.0000 mg | ORAL_TABLET | Freq: Every day | ORAL | 5 refills | Status: DC

## 2022-12-24 MED ORDER — AMLODIPINE BESYLATE 5 MG PO TABS: 5.0000 mg | ORAL_TABLET | Freq: Every day | ORAL | 1 refills | Status: DC

## 2022-12-24 MED ORDER — LOSARTAN POTASSIUM 25 MG PO TABS: 25.0000 mg | ORAL_TABLET | Freq: Every day | ORAL | 1 refills | Status: DC

## 2023-01-08 ENCOUNTER — Encounter (HOSPITAL_BASED_OUTPATIENT_CLINIC_OR_DEPARTMENT_OTHER): Payer: Self-pay

## 2023-01-13 NOTE — Progress Notes (Unsigned)
Cardiology Office Note:    Date:  01/14/2023   ID:  Maurice Little, DOB 1955/12/06, MRN [id number]  PCP:  [place], Blondell Reveal, MD   [facility] Providers Cardiologist:  Maurice Lean, MD     Referring MD: [place], Maurice J, MD   CC: PAD  History of Present Illness:    Maurice Little is a 67 y.o. male with a hx of HTN, FH, PAD s/p [facility] (previously saw Dr. Elmore [facility] Med with 2016 10 x 37 mm expandable stent to the left common iliac artery 95% Little.  20% right common iliac artery Little)  Had a 11/8//22 Cerebellar stroke with small PFO seen.   2023: had no AF on monitor; asymptomatic SVT. LDL at goal.  Patient asked for earlier follow up for worsening leg pain.  Patient notes that he is doing ok.    No chest pain or pressure .  No SOB/DOE and no PND/Orthopnea.  No weight gain or leg swelling.  No palpitations or syncope.  No change in exertional leg pain with cilastazol.  He has previously discussed increased physical activity but he has been limited by the residua from his stroke.   Past Medical History:  Diagnosis Date   Benign prostatic hyperplasia with elevated prostate specific antigen (PSA)    Hypertension    Iliac artery Little, left (HCC)    with claudication resolved with stent   PAD (peripheral artery disease) ([place])    Stroke [facility])     Past Surgical History:  Procedure Laterality Date   BUBBLE STUDY  08/31/2021   Procedure: BUBBLE STUDY;  Surgeon: Maurice Latch, MD;  Location: [facility];  Service: Cardiovascular;;   ILIAC ARTERY STENT Left    IR ANGIO VERTEBRAL SEL SUBCLAVIAN INNOMINATE UNI L MOD SED  08/30/2021   IR ANGIO VERTEBRAL SEL SUBCLAVIAN INNOMINATE UNI R MOD SED  08/30/2021   IR CT HEAD LTD  08/30/2021   IR INTRA CRAN STENT  08/30/2021   IR RADIOLOGIST EVAL & MGMT  11/28/2021   IR RADIOLOGIST EVAL & MGMT  01/12/2022   IR US GUIDE VASC ACCESS RIGHT  08/30/2021   RADIOLOGY WITH ANESTHESIA N/A 08/30/2021   Procedure: IR WITH  ANESTHESIA;  Surgeon: Maurice Bras, MD;  Location: [facility];  Service: Radiology;  Laterality: N/A;   TEE WITHOUT CARDIOVERSION N/A 08/31/2021   Procedure: TRANSESOPHAGEAL ECHOCARDIOGRAM (TEE);  Surgeon: Maurice Latch, MD;  Location: [facility];  Service: Cardiovascular;  Laterality: N/A;    Current Medications: Current Meds  Medication Sig   acetaminophen (TYLENOL) 325 MG tablet Take 1-2 tablets (325-650 mg total) by mouth every 4 (four) hours as needed for mild pain.   amLODipine (NORVASC) 5 MG tablet Take 1 tablet (5 mg total) by mouth daily after supper.   aspirin EC 81 MG tablet Take 1 tablet (81 mg total) by mouth daily. Swallow whole.   citalopram (CELEXA) 10 MG tablet Take 1 tablet (10 mg total) by mouth daily.   Cyanocobalamin (B-12 PO) Take 1 tablet by mouth daily.   ezetimibe (ZETIA) 10 MG tablet Take 1 tablet (10 mg total) by mouth daily.   losartan (COZAAR) 25 MG tablet Take 1 tablet (25 mg total) by mouth daily.   pantoprazole (PROTONIX) 40 MG tablet TAKE 1 TABLET BY MOUTH EVERY DAY   RESTASIS MULTIDOSE 0.05 % ophthalmic emulsion Place 1 drop into the right eye 2 (two) times daily.   rosuvastatin (CRESTOR) 40 MG tablet Take 1 tablet (40 mg  total) by mouth daily.   senna-docusate (SENOKOT-S) 8.6-50 MG tablet Take 2 tablets by mouth 2 (two) times daily.   ticagrelor (BRILINTA) 90 MG TABS tablet Take 1 tablet (90 mg total) by mouth 2 (two) times daily.     Allergies:   Duloxetine   Social History   Socioeconomic History   Marital status: Married    Spouse name: Maurice [facility]   Number of children: Not on file   Years of education: Not on file   Highest education level: Bachelor's degree (e.g., BA, AB, BS)  Occupational History   Not on file  Tobacco Use   Smoking status: Former    Types: Cigarettes   Smokeless tobacco: Never   Tobacco comments:    Quit 2000  Vaping Use   Vaping Use: Never used  Substance and Sexual Activity   Alcohol use: Not Currently   Drug  use: Never   Sexual activity: Not on file  Other Topics Concern   Not on file  Social History Narrative   Lives with wife   Social Determinants of Health   Financial Resource Strain: Not on file  Food Insecurity: Not on file  Transportation Needs: Not on file  Physical Activity: Not on file  Stress: Not on file  Social Connections: Not on file    Social: Comes with wife no kids, Former saw Dr. Janese Little  Family History: The patient's family history includes Healthy in his mother. No prior history of CAD in family  ROS:   Please see the history of present illness.     All other systems reviewed and are negative.  EKGs/Labs/Other Studies Reviewed:    Cardiac Studies & Procedures       ECHOCARDIOGRAM  ECHOCARDIOGRAM COMPLETE 08/30/2021  Narrative ECHOCARDIOGRAM REPORT    Patient Name:   Maurice Little Date of Exam: 08/30/2021 Medical Rec #:  [id number]  Height:       72.0 in Accession #:    [id number] Weight:       202.8 lb Date of Birth:  August 14, 1956   BSA:          2.143 m Patient Age:    86 years   BP:           155/93 mmHg Patient Gender: M          HR:           76 bpm. Exam Location:  Inpatient  Procedure: 2D Echo, Color Doppler and Cardiac Doppler  Indications:    Eval for PFO  History:        Patient has no prior history of Echocardiogram examinations.  Sonographer:    M[facility] Referring Phys: [id number] [facility]   1. Left ventricular ejection fraction, by estimation, is 60 to 65%. The left ventricle has normal function. The left ventricle has no regional wall motion abnormalities. Left ventricular diastolic parameters are consistent with Grade II diastolic dysfunction (pseudonormalization). 2. Right ventricular systolic function is normal. The right ventricular size is normal. Tricuspid regurgitation signal is inadequate for assessing PA pressure. 3. The mitral valve is normal in structure. No evidence of mitral valve regurgitation. No evidence  of mitral Little. 4. The aortic valve is tricuspid. Aortic valve regurgitation is not visualized. Mild aortic valve sclerosis is present, with no evidence of aortic valve Little. 5. The inferior vena cava is normal in size with greater than 50% respiratory variability, suggesting right atrial pressure of 3 mmHg.  FINDINGS Left Ventricle: Left ventricular ejection  fraction, by estimation, is 60 to 65%. The left ventricle has normal function. The left ventricle has no regional wall motion abnormalities. The left ventricular internal cavity size was normal in size. There is no left ventricular hypertrophy. Left ventricular diastolic parameters are consistent with Grade II diastolic dysfunction (pseudonormalization).  Right Ventricle: The right ventricular size is normal. No increase in right ventricular wall thickness. Right ventricular systolic function is normal. Tricuspid regurgitation signal is inadequate for assessing PA pressure.  Left Atrium: Left atrial size was normal in size.  Right Atrium: Right atrial size was normal in size.  Pericardium: There is no evidence of pericardial effusion.  Mitral Valve: The mitral valve is normal in structure. No evidence of mitral valve regurgitation. No evidence of mitral valve Little.  Tricuspid Valve: The tricuspid valve is normal in structure. Tricuspid valve regurgitation is not demonstrated.  Aortic Valve: The aortic valve is tricuspid. Aortic valve regurgitation is not visualized. Mild aortic valve sclerosis is present, with no evidence of aortic valve Little. Aortic valve peak gradient measures 9.4 mmHg.  Pulmonic Valve: The pulmonic valve was normal in structure. Pulmonic valve regurgitation is trivial.  Aorta: The aortic root is normal in size and structure.  Venous: The inferior vena cava is normal in size with greater than 50% respiratory variability, suggesting right atrial pressure of 3 mmHg.  IAS/Shunts: No atrial level shunt  detected by color flow Doppler.   LEFT VENTRICLE PLAX 2D LVIDd:         4.40 cm      Diastology LVIDs:         2.80 cm      LV e' medial:    7.07 cm/s LV PW:         1.00 cm      LV E/e' medial:  8.9 LV IVS:        1.00 cm      LV e' lateral:   6.85 cm/s LVOT diam:     2.10 cm      LV E/e' lateral: 9.2 LV SV:         84 LV SV Index:   39 LVOT Area:     3.46 cm  LV Volumes (MOD) LV vol d, MOD A2C: 96.1 ml LV vol d, MOD A4C: 107.0 ml LV vol s, MOD A2C: 43.0 ml LV vol s, MOD A4C: 44.4 ml LV SV MOD A2C:     53.1 ml LV SV MOD A4C:     107.0 ml LV SV MOD BP:      58.1 ml  RIGHT VENTRICLE             IVC RV Basal diam:  2.60 cm     IVC diam: 1.70 cm RV Mid diam:    2.50 cm RV S prime:     15.40 cm/s TAPSE (M-mode): 2.5 cm  LEFT ATRIUM           Index        RIGHT ATRIUM           Index LA diam:      3.60 cm 1.68 cm/m   RA Area:     14.90 cm LA Vol (A2C): 37.2 ml 17.36 ml/m  RA Volume:   29.30 ml  13.67 ml/m LA Vol (A4C): 50.2 ml 23.42 ml/m AORTIC VALVE AV Area (Vmax): 2.94 cm AV Vmax:        153.00 cm/s AV Peak Grad:   9.4 mmHg LVOT Vmax:  130.00 cm/s LVOT Vmean:     73.000 cm/s LVOT VTI:       0.243 m  AORTA Ao Root diam: 2.90 cm Ao Asc diam:  3.40 cm  MITRAL VALVE MV Area (PHT): 3.31 cm    SHUNTS MV Decel Time: 229 msec    Systemic VTI:  0.24 m MV E velocity: 62.90 cm/s  Systemic Diam: 2.10 cm MV A velocity: 67.00 cm/s MV E/A ratio:  0.94  Dalton McleanMD Electronically signed by Franki Monte Signature Date/Time: 08/30/2021/2:45:21 PM    Final   TEE  ECHO TEE 09/05/2021  Narrative TRANSESOPHOGEAL ECHO REPORT    Patient Name:   Maurice Little Date of Exam: 08/31/2021 Medical Rec #:  [id number]  Height:       72.0 in Accession #:    [id number] Weight:       202.8 lb Date of Birth:  Dec 24, 1955   BSA:          2.143 m Patient Age:    77 years   BP:           127/74 mmHg Patient Gender: M          HR:           97 bpm. Exam Location:   Inpatient  Procedure: Cardiac Doppler, Color Doppler, Transesophageal Echo and Saline Contrast Bubble Study  Indications:     Stroke  History:         Patient has prior history of Echocardiogram examinations, most recent 08/30/2021.  Sonographer:     Merrie Roof RDCS Referring Phys:  1993 RHONDA G BARRETT Diagnosing Phys: Maurice Latch MD  PROCEDURE: After discussion of the risks and benefits of a TEE, an informed consent was obtained from the patient. The transesophogeal probe was passed without difficulty through the esophogus of the patient. Local oropharyngeal anesthetic was provided with Cetacaine. Sedation performed by different physician. The patient was monitored while under deep sedation. The patient's vital signs; including heart rate, blood pressure, and oxygen saturation; remained stable throughout the procedure. The patient developed no complications during the procedure.  IMPRESSIONS   1. Left ventricular ejection fraction, by estimation, is 55 to 60%. The left ventricle has normal function. The left ventricle has no regional wall motion abnormalities. 2. Right ventricular systolic function is normal. The right ventricular size is normal. 3. No left atrial/left atrial appendage thrombus was detected. 4. The mitral valve is normal in structure. Trivial mitral valve regurgitation. No evidence of mitral Little. 5. The aortic valve is tricuspid. Aortic valve regurgitation is not visualized. No aortic Little is present. 6. 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 7. The inferior vena cava is normal in size with greater than 50% respiratory variability, suggesting right atrial pressure of 3 mmHg. 8. Agitated saline contrast bubble study was positive with shunting observed within 3-6 cardiac cycles suggestive of interatrial shunt. There is a small patent foramen ovale with predominantly right to left shunting across the atrial  septum.  Conclusion(s)/Recommendation(s): Normal biventricular function without evidence of hemodynamically significant valvular heart disease.  FINDINGS Left Ventricle: Left ventricular ejection fraction, by estimation, is 55 to 60%. The left ventricle has normal function. The left ventricle has no regional wall motion abnormalities. The left ventricular internal cavity size was normal in size. There is no left ventricular hypertrophy.  Right Ventricle: The right ventricular size is normal. No increase in right ventricular wall thickness. Right ventricular systolic function is normal.  Left Atrium: Left  atrial size was normal in size. No left atrial/left atrial appendage thrombus was detected.  Right Atrium: Right atrial size was normal in size.  Pericardium: There is no evidence of pericardial effusion.  Mitral Valve: The mitral valve is normal in structure. Trivial mitral valve regurgitation. No evidence of mitral valve Little.  Tricuspid Valve: The tricuspid valve is normal in structure. Tricuspid valve regurgitation is not demonstrated. No evidence of tricuspid Little.  Aortic Valve: The aortic valve is tricuspid. Aortic valve regurgitation is not visualized. No aortic Little is present.  Pulmonic Valve: The pulmonic valve was normal in structure. Pulmonic valve regurgitation is trivial. No evidence of pulmonic Little.  Aorta: The aortic root is normal in size and structure. There is moderate (Grade III) atheroma plaque involving the descending aorta.  Venous: The inferior vena cava is normal in size with greater than 50% respiratory variability, suggesting right atrial pressure of 3 mmHg.  IAS/Shunts: No atrial level shunt detected by color flow Doppler. Agitated saline contrast was given intravenously to evaluate for intracardiac shunting. Agitated saline contrast bubble study was positive with shunting observed within 3-6 cardiac cycles suggestive of interatrial shunt. A  small patent foramen ovale is detected with predominantly right to left shunting across the atrial septum.  Maurice Latch MD Electronically signed by Maurice Latch MD Signature Date/Time: 09/05/2021/11:24:08 AM    Final   MONITORS  LONG TERM MONITOR (3-14 DAYS) 12/02/2021  Narrative  Patient had a minimum heart rate of 47 bpm, maximum heart rate of 158 bpm (SVT), and average heart rate of 71 bpm.  Predominant underlying rhythm was sinus rhythm.  Eight runs of SVT occurred lasting 11 seconds at longest with a max rate of 158 bpm at fastest.  Isolated PACs were rare (<1.0%).  Isolated PVCs were rare (<1.0%).  Triggered and diary events associated with sinus rhythm.  Asymptomatic SVT.            Recent Labs: 12/06/2022: ALT 22; BUN 16; Creatinine, Ser 1.14; Hemoglobin 14.1; Platelets 253; Potassium 4.3; Sodium 138  Recent Lipid Panel    Component Value Date/Time   CHOL 100 12/06/2022 1119   TRIG 102 12/06/2022 1119   HDL 36 (L) 12/06/2022 1119   CHOLHDL 2.8 12/06/2022 1119   CHOLHDL 5.5 08/29/2021 2340   VLDL 16 08/29/2021 2340   LDLCALC 45 12/06/2022 1119       Physical Exam:    VS:  BP 128/88   Pulse 63   Ht 6' (1.829 m)   Wt 253 lb (114.8 kg)   SpO2 97%   BMI 34.31 kg/m     Wt Readings from Last 3 Encounters:  01/14/23 253 lb (114.8 kg)  09/10/22 250 lb (113.4 kg)  07/15/22 243 lb (110.2 kg)    Gen: No distress   Neck: No JVD, bilateral soft carotid bruit Ears: Bilateral Pilar Plate Sign Cardiac: No Rubs or Gallops, no murmur, regular rhythm, +2 radial pulses, no leg ulcers, +1 PT Respiratory: Clear to auscultation bilaterally, normal effort, normal  respiratory rate GI: Soft, nontender, non-distended  MS: No edema Integument: Skin feels warm Neuro:  At time of evaluation, alert and oriented to person/place/time/situation Psych: Normal mood and affect  ASSESSMENT:    1. PAD (peripheral artery disease) ([place])   2. 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 Carotid Little, right   4. PFO (patent foramen ovale)   5. Familial hypercholesterolemia      PLAN:    PAD -  CT was reassuring - no improvement in cilastazol - BP at goal on current meds  - Will send a message to one of my partners, Dr. Gwenlyn Found, if he this direct angiography would be warranted will refer  Cerebellar stroke PFO with Rope Score of  4, presently no plans for closure Aortic atherosclerosis Familial Hypercholesterolemia - continue zetia and statin LDL goal < 55, he is at goal  P-SVT (asymptomatic) Asymptomatic - if symptoms of SVT, norvasc-> diltiazem  Moderate bilateral CAS - followed by Dr. Estanislado Pandy who is presently managing his DAPT  One year me or my team       Medication Adjustments/Labs and Tests Ordered: Current medicines are reviewed at length with the patient today.  Concerns regarding medicines are outlined above.  No orders of the defined types were placed in this encounter.  No orders of the defined types were placed in this encounter.   Patient Instructions  Medication Instructions:  Your physician recommends that you continue on your current medications as directed. Please refer to the Current Medication list given to you today.  *If you need a refill on your cardiac medications before your next appointment, please call your pharmacy*   Lab Work: NONE If you have labs (blood work) drawn today and your tests are completely normal, you will receive your results only by: [place] (if you have MyChart) OR A paper copy in the mail If you have any lab test that is abnormal or we need to change your treatment, we will call you to review the results.   Testing/Procedures: NONE   Follow-Up: At [facility], you and your health needs are our priority.  As part of our continuing mission to provide you with exceptional heart care, we have created designated Provider Care Teams.   These Care Teams include your primary Cardiologist (physician) and Advanced Practice Providers (APPs -  Physician Assistants and Nurse Practitioners) who all work together to provide you with the care you need, when you need it.  Your next appointment:   1 year(s)  Provider:   Rudean Haskell, MD       Signed, Maurice Lean, MD  01/14/2023 5:30 PM    [place]

## 2023-01-14 ENCOUNTER — Ambulatory Visit: Payer: BC Managed Care – PPO | Attending: Internal Medicine | Admitting: Internal Medicine

## 2023-01-14 ENCOUNTER — Encounter: Payer: Self-pay | Admitting: Internal Medicine

## 2023-01-14 VITALS — BP 128/88 | HR 63 | Ht 72.0 in | Wt 253.0 lb

## 2023-01-14 DIAGNOSIS — E7801 Familial hypercholesterolemia: Secondary | ICD-10-CM | POA: Diagnosis not present

## 2023-01-14 DIAGNOSIS — I6521 Occlusion and stenosis of right carotid artery: Secondary | ICD-10-CM

## 2023-01-14 DIAGNOSIS — I739 Peripheral vascular disease, unspecified: Secondary | ICD-10-CM | POA: Diagnosis not present

## 2023-01-14 DIAGNOSIS — Q2112 Patent foramen ovale: Secondary | ICD-10-CM

## 2023-01-14 DIAGNOSIS — I63111 Cerebral infarction due to embolism of right vertebral artery: Secondary | ICD-10-CM

## 2023-01-14 NOTE — Patient Instructions (Signed)
Medication Instructions:  Your physician recommends that you continue on your current medications as directed. Please refer to the Current Medication list given to you today.  *If you need a refill on your cardiac medications before your next appointment, please call your pharmacy*   Lab Work: NONE If you have labs (blood work) drawn today and your tests are completely normal, you will receive your results only by: MyChart Message (if you have MyChart) OR A paper copy in the mail If you have any lab test that is abnormal or we need to change your treatment, we will call you to review the results.   Testing/Procedures: NONE   Follow-Up: At [facility], you and your health needs are our priority.  As part of our continuing mission to provide you with exceptional heart care, we have created designated Provider Care Teams.  These Care Teams include your primary Cardiologist (physician) and Advanced Practice Providers (APPs -  Physician Assistants and Nurse Practitioners) who all work together to provide you with the care you need, when you need it.   Your next appointment:   1 year(s)  Provider:   Mahesh Chandrasekhar, MD     

## 2023-02-06 ENCOUNTER — Telehealth (HOSPITAL_COMMUNITY): Payer: Self-pay

## 2023-02-06 ENCOUNTER — Other Ambulatory Visit: Payer: Self-pay | Admitting: Student

## 2023-02-06 MED ORDER — TICAGRELOR 90 MG PO TABS: 90.0000 mg | ORAL_TABLET | Freq: Two times a day (BID) | ORAL | 3 refills | Status: DC

## 2023-02-06 NOTE — Telephone Encounter (Signed)
Pt's wife called to request a 90-day refill of Brilinta instead of a 30-day. I've sent a message to our PA to call this into the [facility] in his chart. AB

## 2023-02-12 ENCOUNTER — Other Ambulatory Visit (HOSPITAL_COMMUNITY): Payer: Self-pay | Admitting: Interventional Radiology

## 2023-02-12 DIAGNOSIS — I771 Stricture of artery: Secondary | ICD-10-CM

## 2023-02-24 ENCOUNTER — Encounter (HOSPITAL_COMMUNITY): Payer: Self-pay

## 2023-02-24 ENCOUNTER — Ambulatory Visit (HOSPITAL_COMMUNITY): Payer: BC Managed Care – PPO

## 2023-02-25 ENCOUNTER — Telehealth (HOSPITAL_COMMUNITY): Payer: Self-pay

## 2023-02-25 DIAGNOSIS — Z0289 Encounter for other administrative examinations: Secondary | ICD-10-CM

## 2023-02-25 NOTE — Telephone Encounter (Signed)
Returned pt's call to reschedule US carotid and get updated insurance. AB

## 2023-02-28 ENCOUNTER — Ambulatory Visit (HOSPITAL_COMMUNITY)
Admission: RE | Admit: 2023-02-28 | Discharge: 2023-02-28 | Disposition: A | Discharge status: Home / Self Care | Payer: Medicare Other | Source: Ambulatory Visit | Attending: Family Medicine | Admitting: Family Medicine

## 2023-02-28 DIAGNOSIS — I771 Stricture of artery: Secondary | ICD-10-CM | POA: Diagnosis present

## 2023-02-28 DIAGNOSIS — I10 Essential (primary) hypertension: Secondary | ICD-10-CM | POA: Insufficient documentation

## 2023-02-28 DIAGNOSIS — I6529 Occlusion and stenosis of unspecified carotid artery: Secondary | ICD-10-CM | POA: Diagnosis not present

## 2023-02-28 NOTE — Progress Notes (Signed)
Bilateral carotid ultrasound study completed.   Please see CV Procedures for preliminary results.  Harlea Goetzinger, RVT  2:30 PM 02/28/23

## 2023-03-03 ENCOUNTER — Telehealth (HOSPITAL_COMMUNITY): Payer: Self-pay

## 2023-03-03 NOTE — Telephone Encounter (Signed)
Pt agreed to f/u in 6 months with a us carotid. AB  

## 2023-03-20 IMAGING — CT CT HEAD W/O CM
4 series · 16 of 47 positions shown, 18 images · non-contrast
Comparison: 08/29/2021 CT head, correlation is also made with
09/01/2021 MRI brain

CLINICAL DATA: Stroke, follow-up, new vision changes

EXAM:
CT HEAD WITHOUT CONTRAST
TECHNIQUE: Contiguous axial images were obtained from the base of the skull
through the vertex without intravenous contrast.

[Series 3: head without · axial · non-contrast · 0.44mm/px · z∈[-93,+27]mm · 7 of 34 slices shown, 9 images]
[im 5/34  brain]
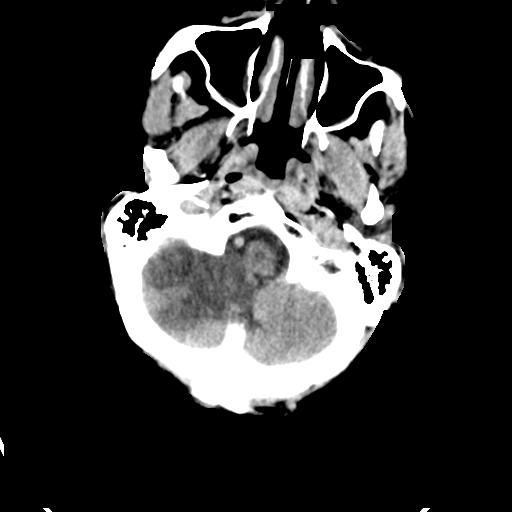
[im 5/34  bone]
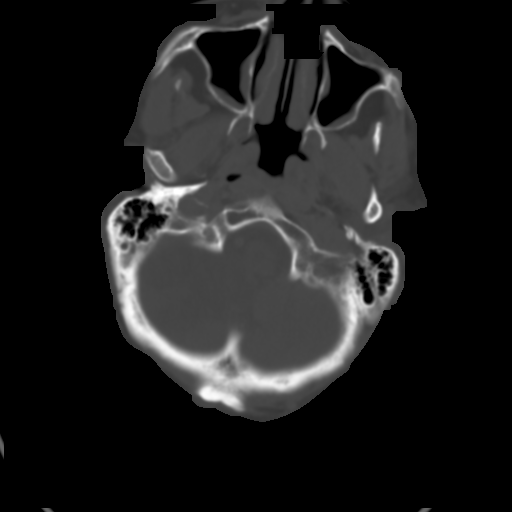
[im 9/34  brain]
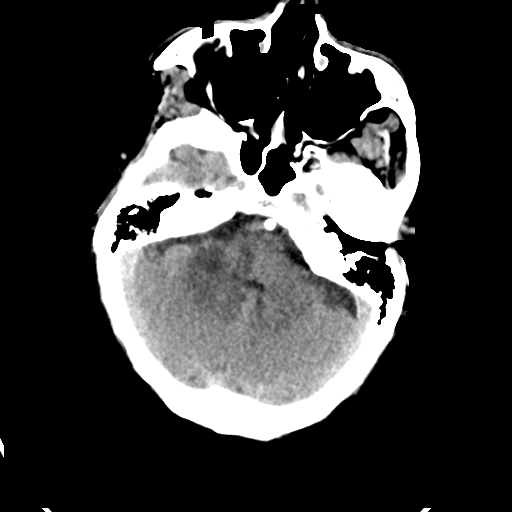
[im 13/34  brain]
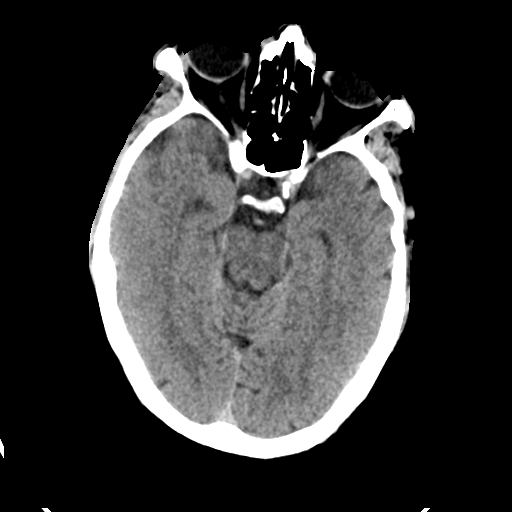
[im 17/34  brain]
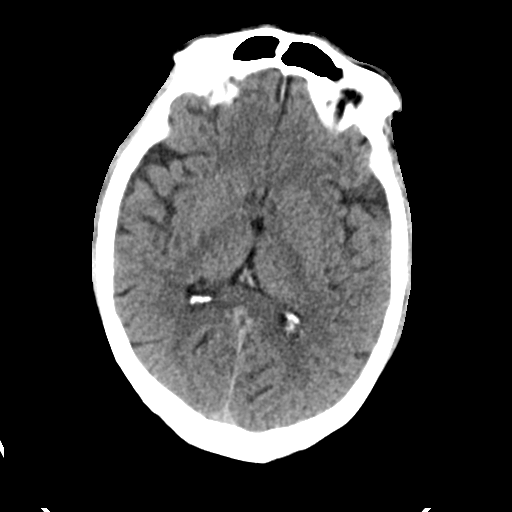
[im 21/34  brain]
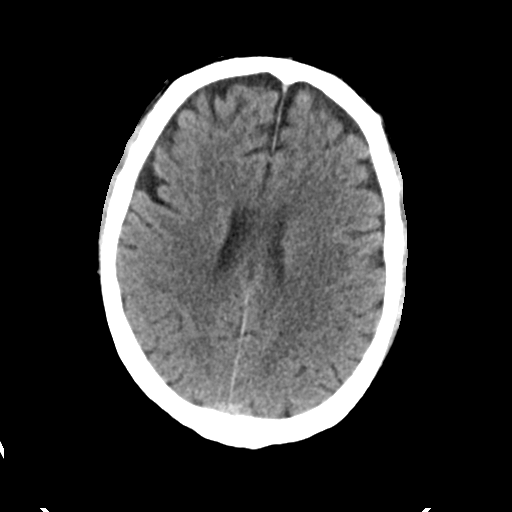
[im 21/34  bone]
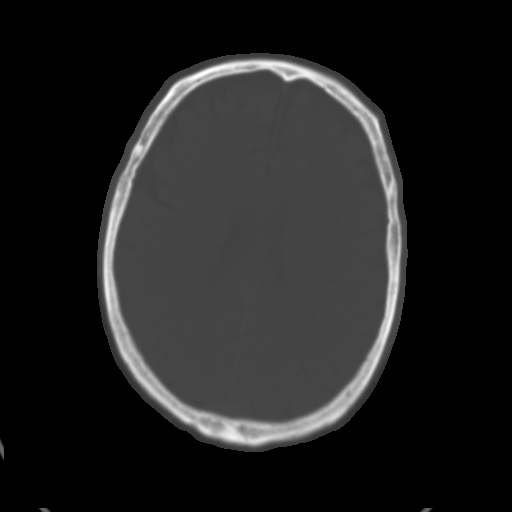
[im 25/34  brain]
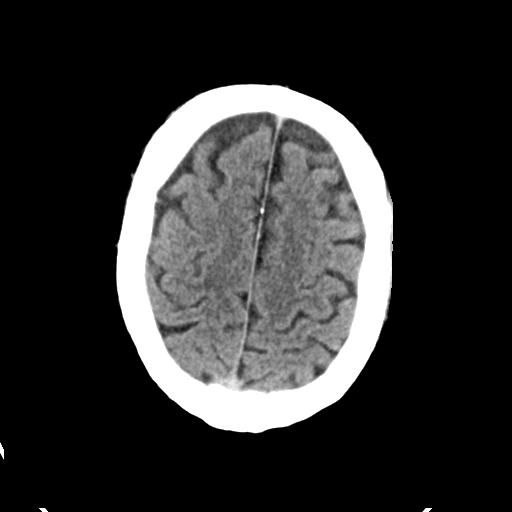
[im 29/34  brain]
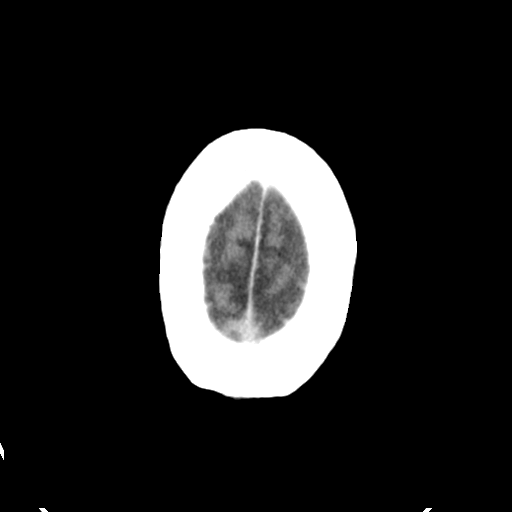

[Series 4: head bone · axial · 0.44mm/px · z∈[-97,-63]mm · 3 of 85 slices shown]
[im 9/85  bone]
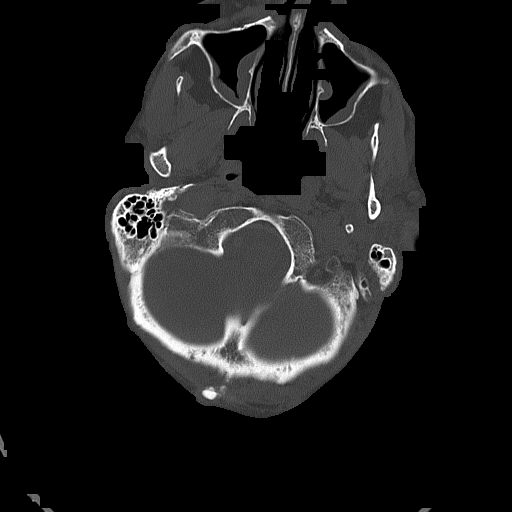
[im 17/85  bone]
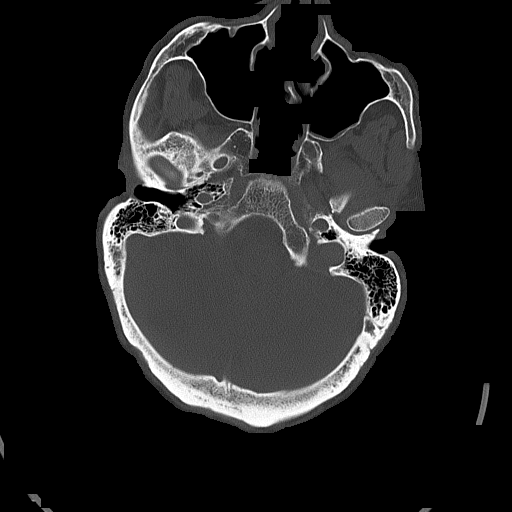
[im 26/85  bone]
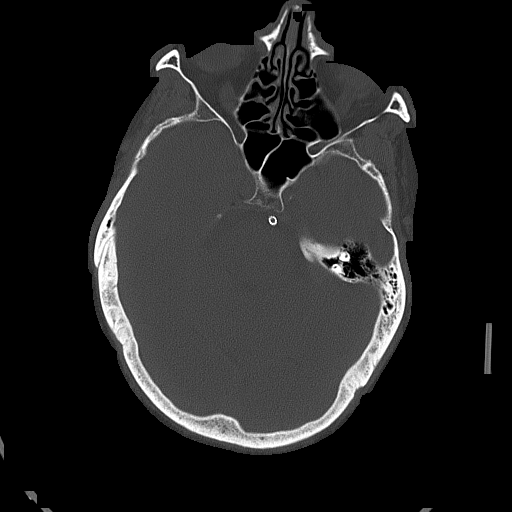

[Series 5: head without cor · coronal · non-contrast · 0.34mm/px · 3 of 69 slices shown]
[im 23/69  brain]
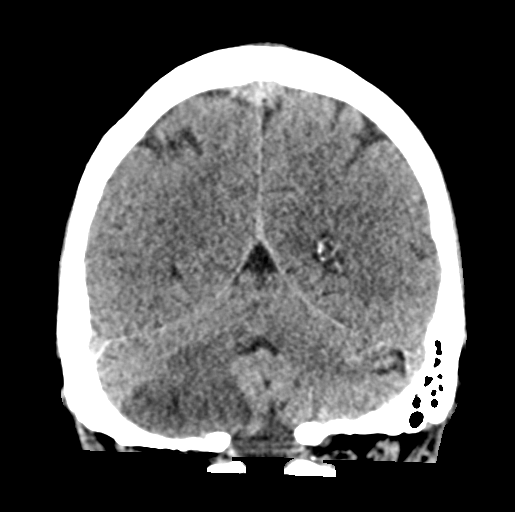
[im 31/69  brain]
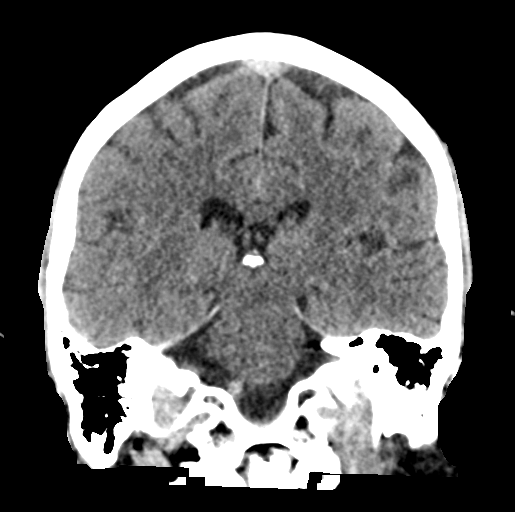
[im 38/69  brain]
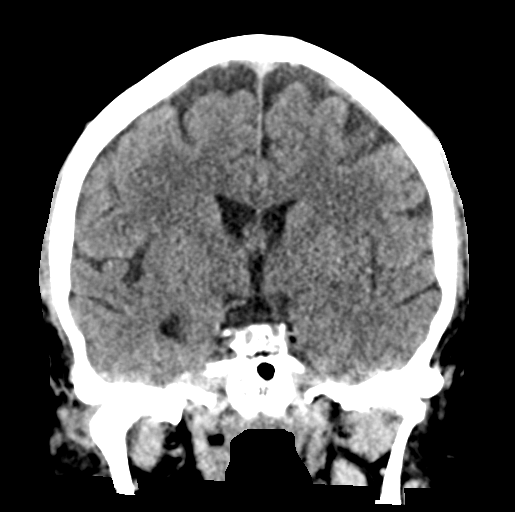

[Series 6: head without sag · sagittal · non-contrast · 0.35mm/px · 3 of 58 slices shown]
[im 20/58  brain]
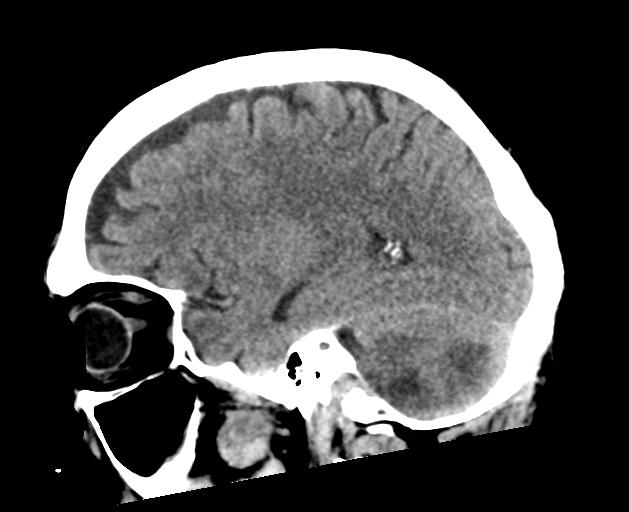
[im 29/58  brain]
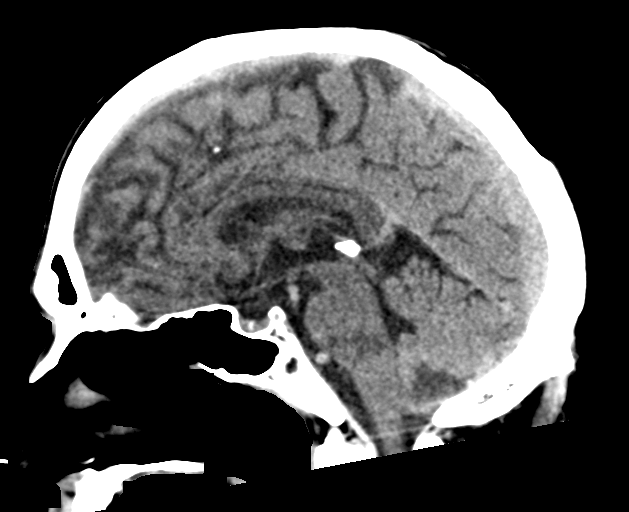
[im 39/58  brain]
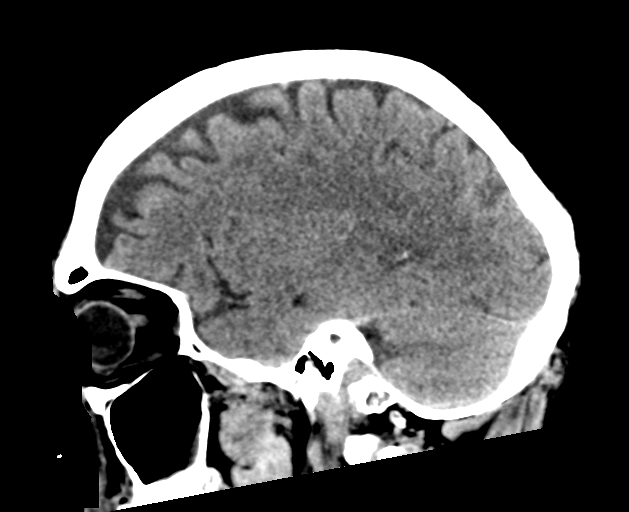

[16 of 47 positions shown; findings below may reference images not displayed]

FINDINGS: Brain: Increased hypodensity in the right cerebellar hemisphere and
pons compared to 08/29/2021, which largely correlates with the
restricted diffusion on the 09/01/2021 exam. Additional focal area
of hypodensity in the medulla (series 3, image 5 and series 6, image
31) does not correspond to an area of restricted diffusion on the
prior MRI. No hemorrhage, mass, mass effect, or midline shift.

Vascular: Interval stenting of the basilar artery.

Skull: No acute osseous abnormality.

Sinuses/Orbits: Mild mucosal thickening in the maxillary sinuses.

Other: Trace fluid in right mastoid air cells.
IMPRESSION: 1. Increased hypodensity in the right cerebellar hemisphere and
pons, which largely correlates with an area of restricted diffusion,
with an additional focal area of hypodensity in the medulla, which
does not correspond to an area of restricted diffusion on the prior
MRI and is concerning for additional area of infarction.
2. Interval stenting of the basilar artery.

These results were called by telephone at the time of interpretation
on 09/05/2021 at [DATE] to provider KPODONU SLY GEE , who verbally
acknowledged these results.

## 2023-03-21 IMAGING — DX DG CHEST 2V
2 series · 2 of 2 positions shown · non-contrast
Comparison: None.

CLINICAL DATA: Leukocytosis

EXAM:
CHEST - 2 VIEW

[x chest ap]
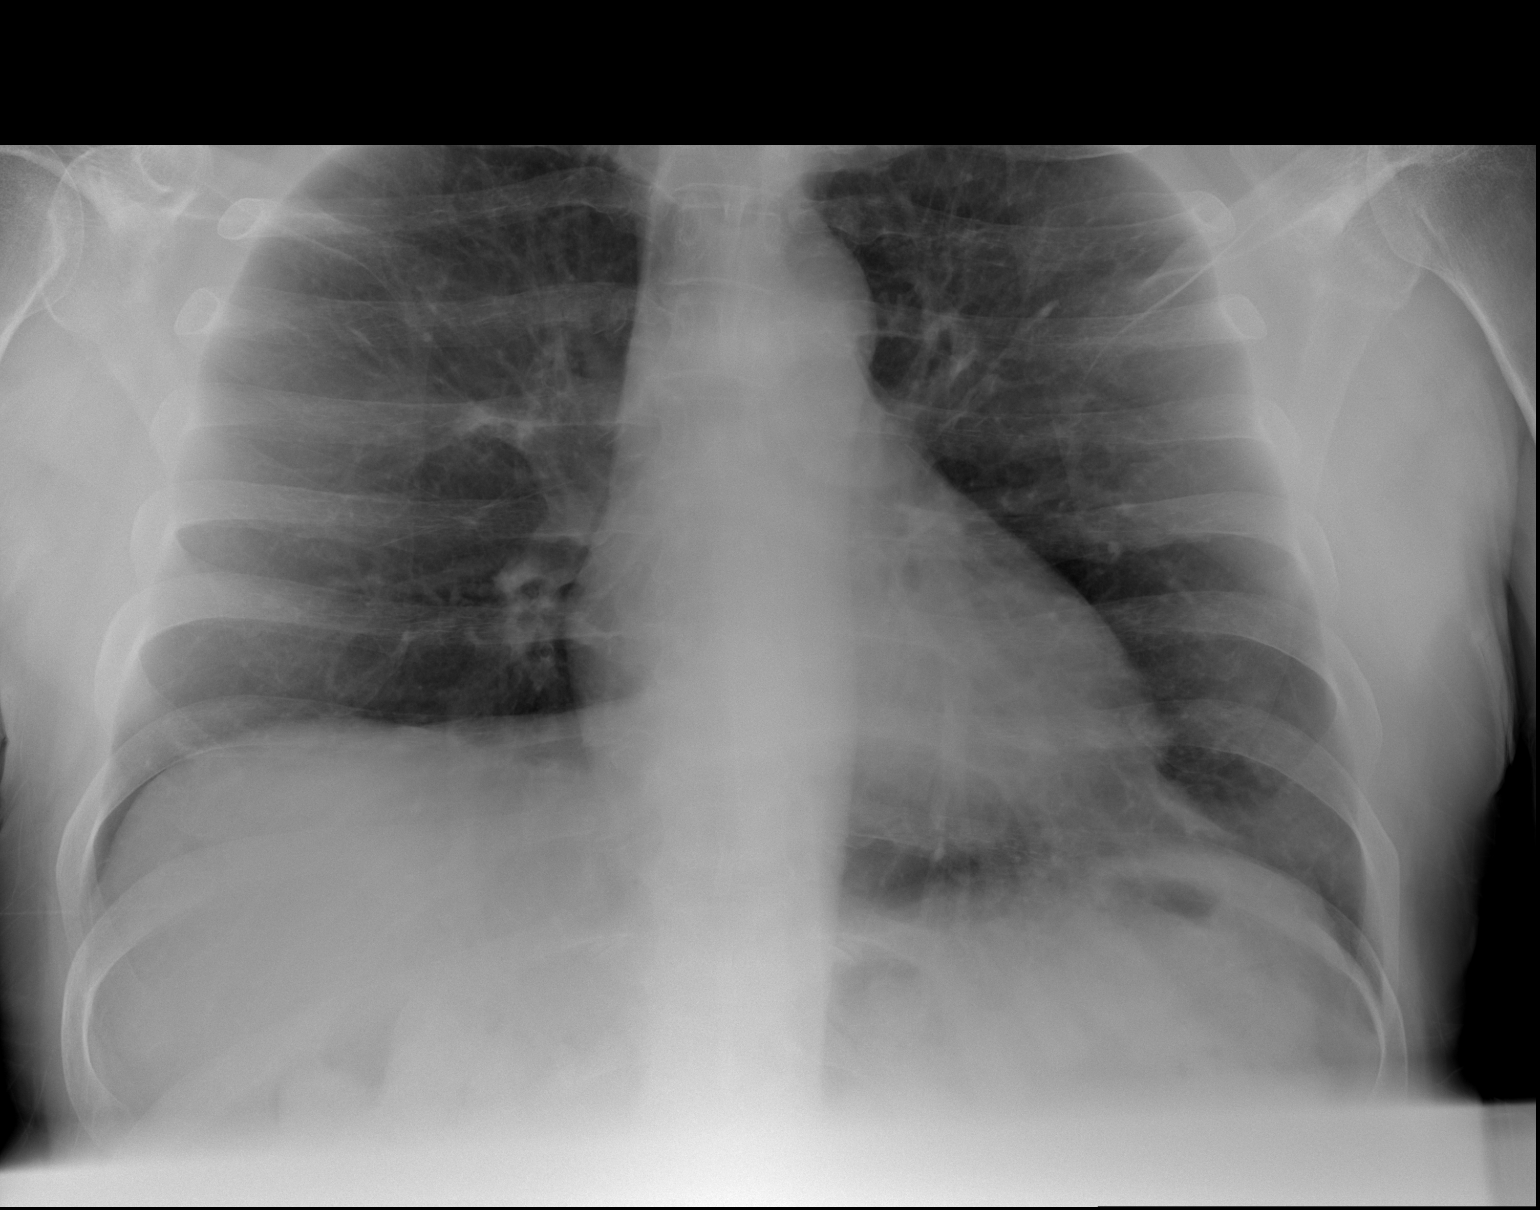

[w chest lat]
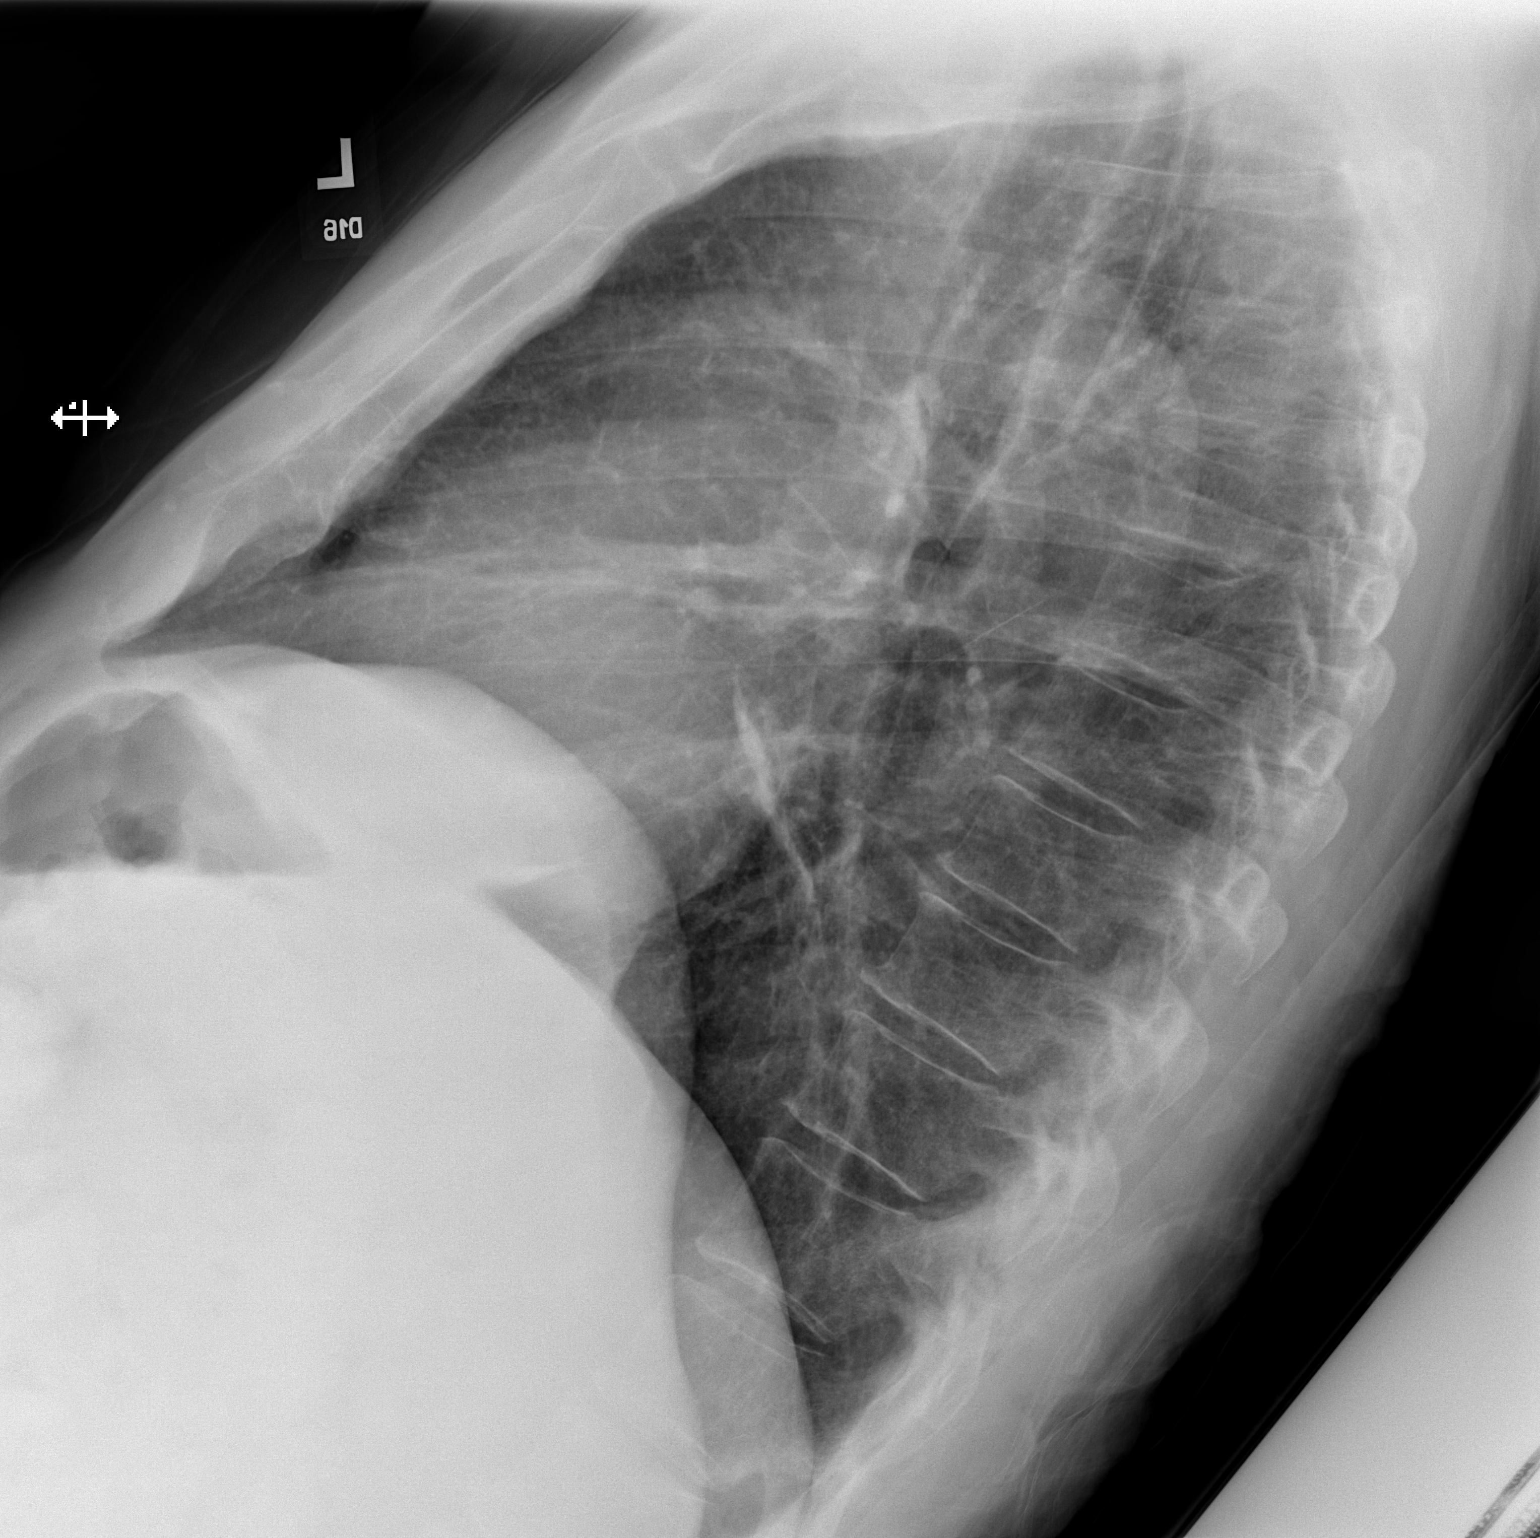

[2 of 2 positions shown; findings below may reference images not displayed]

FINDINGS: Heart size and mediastinal contours are within normal limits. No
suspicious pulmonary opacities identified.

No pleural effusion or pneumothorax visualized.

No acute osseous abnormality appreciated.
IMPRESSION: No acute intrathoracic process identified.

## 2023-06-09 DIAGNOSIS — G510 Bell's palsy: Secondary | ICD-10-CM | POA: Diagnosis not present

## 2023-06-09 DIAGNOSIS — G464 Cerebellar stroke syndrome: Secondary | ICD-10-CM | POA: Diagnosis not present

## 2023-06-09 DIAGNOSIS — H168 Other keratitis: Secondary | ICD-10-CM | POA: Diagnosis not present

## 2023-06-10 ENCOUNTER — Encounter: Payer: Self-pay | Admitting: Physical Medicine & Rehabilitation

## 2023-06-10 ENCOUNTER — Encounter: Payer: Medicare Other | Attending: Physical Medicine & Rehabilitation | Admitting: Physical Medicine & Rehabilitation

## 2023-06-10 VITALS — BP 138/91 | HR 72 | Ht 72.0 in | Wt 253.0 lb

## 2023-06-10 DIAGNOSIS — I639 Cerebral infarction, unspecified: Secondary | ICD-10-CM | POA: Insufficient documentation

## 2023-06-10 DIAGNOSIS — G510 Bell's palsy: Secondary | ICD-10-CM | POA: Insufficient documentation

## 2023-06-10 DIAGNOSIS — G463 Brain stem stroke syndrome: Secondary | ICD-10-CM | POA: Insufficient documentation

## 2023-06-10 NOTE — Progress Notes (Signed)
Subjective:    Patient ID: Maurice Little, male    DOB: [birth year], 67 y.o.   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  He went through inpatient rehabilitation and has completed outpatient rehabilitation.  He has applied for Social Security disability and is awaiting further evaluation after denial initially he is on appeal. Low back pain overall improved, feels better on recumbent bike Reduced taste right side tongue Diplopia imporved Uses scooter for community  Numbness on left , ataxia on right side  Dizziness limits his activities. CN 7 and 8 right   RIght hand  OA and Right toes   Pain Inventory Average Pain 0 Pain Right Now 0 My pain is  no pain  In the last 24 hours, has pain interfered with the following? General activity 0 Relation with others 0 Enjoyment of life 0 What TIME of day is your pain at its worst? No pain Sleep (in general) Good  Pain is worse with:  no pain Pain improves with:  no pain Relief from Meds:  no pain  Family History  Problem Relation Age of Onset   Healthy Mother    Social History   Socioeconomic History   Marital status: Married    Spouse name: Jody   Number of children: Not on file   Years of education: Not on file   Highest education level: Bachelor's degree (e.g., BA, AB, BS)  Occupational History   Not on file  Tobacco Use   Smoking status: Former    Types: Cigarettes   Smokeless tobacco: Never   Tobacco comments:    Quit 2000  Vaping Use   Vaping status: Never Used  Substance and Sexual Activity   Alcohol use: Not Currently   Drug use: Never   Sexual activity: Not on file  Other Topics Concern   Not on file  Social History Narrative   Lives with wife   Social Determinants of Health   Financial Resource Strain: Not on file  Food Insecurity: Not on file  Transportation Needs: Not on file  Physical Activity: Not on file  Stress: Not on file   Social Connections: Not on file   Past Surgical History:  Procedure Laterality Date   BUBBLE STUDY  08/31/2021   Procedure: BUBBLE STUDY;  Surgeon: Chilton Si, MD;  Location: [facility] ENDOSCOPY;  Service: Cardiovascular;;   ILIAC ARTERY STENT Left    IR ANGIO VERTEBRAL SEL SUBCLAVIAN INNOMINATE UNI L MOD SED  08/30/2021   IR ANGIO VERTEBRAL SEL SUBCLAVIAN INNOMINATE UNI R MOD SED  08/30/2021   IR CT HEAD LTD  08/30/2021   IR INTRA CRAN STENT  08/30/2021   IR RADIOLOGIST EVAL & MGMT  11/28/2021   IR RADIOLOGIST EVAL & MGMT  01/12/2022   IR US GUIDE VASC ACCESS RIGHT  08/30/2021   RADIOLOGY WITH ANESTHESIA N/A 08/30/2021   Procedure: IR WITH ANESTHESIA;  Surgeon: Julieanne Cotton, MD;  Location: [facility] OR;  Service: Radiology;  Laterality: N/A;   TEE WITHOUT CARDIOVERSION N/A 08/31/2021   Procedure: TRANSESOPHAGEAL ECHOCARDIOGRAM (TEE);  Surgeon: Chilton Si, MD;  Location: [facility] ENDOSCOPY;  Service: Cardiovascular;  Laterality: N/A;   Past Surgical History:  Procedure Laterality Date   BUBBLE STUDY  08/31/2021   Procedure: BUBBLE STUDY;  Surgeon: Chilton Si, MD;  Location: [facility] ENDOSCOPY;  Service: Cardiovascular;;   ILIAC ARTERY STENT Left    IR ANGIO VERTEBRAL SEL SUBCLAVIAN INNOMINATE UNI L MOD SED  08/30/2021  IR ANGIO VERTEBRAL SEL SUBCLAVIAN INNOMINATE UNI R MOD SED  08/30/2021   IR CT HEAD LTD  08/30/2021   IR INTRA CRAN STENT  08/30/2021   IR RADIOLOGIST EVAL & MGMT  11/28/2021   IR RADIOLOGIST EVAL & MGMT  01/12/2022   IR US GUIDE VASC ACCESS RIGHT  08/30/2021   RADIOLOGY WITH ANESTHESIA N/A 08/30/2021   Procedure: IR WITH ANESTHESIA;  Surgeon: Julieanne Cotton, MD;  Location: [facility] OR;  Service: Radiology;  Laterality: N/A;   TEE WITHOUT CARDIOVERSION N/A 08/31/2021   Procedure: TRANSESOPHAGEAL ECHOCARDIOGRAM (TEE);  Surgeon: Chilton Si, MD;  Location: [facility] ENDOSCOPY;  Service: Cardiovascular;  Laterality: N/A;   Past Medical History:  Diagnosis Date   Benign  prostatic hyperplasia with elevated prostate specific antigen (PSA)    Hypertension    Iliac artery stenosis, left (HCC)    with claudication resolved with stent   PAD (peripheral artery disease) (HCC)    Stroke (HCC)    BP (!) 138/91   Pulse 72   Ht 6' (1.829 m)   Wt 253 lb (114.8 kg)   SpO2 95%   BMI 34.31 kg/m   Opioid Risk Score:   Fall Risk Score:  `1  Depression screen [facility] 2/9     12/10/2022   10:47 AM 11/26/2022    8:40 AM 05/27/2022    2:33 PM 05/14/2022   10:04 AM 02/08/2022   10:19 AM 11/12/2021    1:33 PM 10/08/2021    9:06 AM  Depression screen PHQ 2/9  Decreased Interest 0 0 0 0 0 0 0  Down, Depressed, Hopeless 0 0 0 0 0 0 0  PHQ - 2 Score 0 0 0 0 0 0 0  Altered sleeping  0 0    0  Tired, decreased energy  0 0    0  Change in appetite  0 0    0  Feeling bad or failure about yourself   0 0    0  Trouble concentrating  0 0    0  Moving slowly or fidgety/restless  0 0    0  Suicidal thoughts  0 0    0  PHQ-9 Score  0 0    0  Difficult doing work/chores  Not difficult at all Not difficult at all         Review of Systems  All other systems reviewed and are negative.      Objective:   Physical Exam Motor strength is 5/5 bilateral deltoid bicep tricep grip hip flexor knee extensor ankle dorsiflexor Sensation absent to light touch and pinprick on the face as well as left upper and left lower limb. Cerebellar moderate ataxia right finger-nose to finger as well as right heel-to-shin. Speech with spastic dysarthria moderate Standing balance is fair wide-based support unable to ambulate without assistive device.  Is limited to very short distances but does not have his ambulatory device with him right now.  He is using a scooter. Neuro:  Eyes without evidence of nystagmus  Tone is normal without evidence of spasticity   Cranial nerves II- Visual fields are intact to confrontation testing, no blurring of vision III- no evidence of ptosis, upward, downward and  medial gaze intact IV- no vertical diplopia or head tilt V-right facial numbness  VI- no pupil abduction weakness VII- mild facial droop, mildly diminished right lid closure VII- normal auditory acuity on the left but impaired on the right side IX- no pharygeal weakness,  X- no  pharyngeal weakness, no hoarseness XI- no trap or SCM weakness XII- no glossal weakness         Assessment & Plan:  #1.  Right pontomedullary as well as right cerebellar infarct with chronic truncal as well as right upper and lower limb ataxia, sensory deficits in left upper and left lower limb, right cranial nerve VII palsy .  I believe the patient is permanently disabled from his job as well as any gainful employment. Physical medicine rehab follow-up in 1 year or sooner Do not think he requires any physical or occupational therapy or speech therapy at this time he has plateaued and is functioning

## 2023-07-08 DIAGNOSIS — S82401A Unspecified fracture of shaft of right fibula, initial encounter for closed fracture: Secondary | ICD-10-CM | POA: Diagnosis not present

## 2023-07-08 DIAGNOSIS — S82831A Other fracture of upper and lower end of right fibula, initial encounter for closed fracture: Secondary | ICD-10-CM | POA: Insufficient documentation

## 2023-07-08 DIAGNOSIS — M25571 Pain in right ankle and joints of right foot: Secondary | ICD-10-CM | POA: Diagnosis not present

## 2023-07-08 HISTORY — DX: Other fracture of upper and lower end of right fibula, initial encounter for closed fracture: S82.831A

## 2023-07-09 DIAGNOSIS — S82831D Other fracture of upper and lower end of right fibula, subsequent encounter for closed fracture with routine healing: Secondary | ICD-10-CM | POA: Diagnosis not present

## 2023-07-17 DIAGNOSIS — S82841D Displaced bimalleolar fracture of right lower leg, subsequent encounter for closed fracture with routine healing: Secondary | ICD-10-CM | POA: Diagnosis not present

## 2023-07-17 DIAGNOSIS — S82841A Displaced bimalleolar fracture of right lower leg, initial encounter for closed fracture: Secondary | ICD-10-CM

## 2023-07-17 HISTORY — DX: Displaced bimalleolar fracture of right lower leg, initial encounter for closed fracture: S82.841A

## 2023-08-19 DIAGNOSIS — S82841D Displaced bimalleolar fracture of right lower leg, subsequent encounter for closed fracture with routine healing: Secondary | ICD-10-CM | POA: Diagnosis not present

## 2023-08-21 ENCOUNTER — Encounter (HOSPITAL_COMMUNITY): Payer: Self-pay

## 2023-08-21 ENCOUNTER — Emergency Department (HOSPITAL_COMMUNITY)
Admission: EM | Admit: 2023-08-21 | Discharge: 2023-08-21 | Disposition: A | Discharge status: Home / Self Care | Payer: Medicare Other | Attending: Emergency Medicine | Admitting: Emergency Medicine

## 2023-08-21 ENCOUNTER — Other Ambulatory Visit: Payer: Self-pay

## 2023-08-21 DIAGNOSIS — R040 Epistaxis: Secondary | ICD-10-CM

## 2023-08-21 DIAGNOSIS — I10 Essential (primary) hypertension: Secondary | ICD-10-CM | POA: Diagnosis not present

## 2023-08-21 DIAGNOSIS — Z7982 Long term (current) use of aspirin: Secondary | ICD-10-CM | POA: Insufficient documentation

## 2023-08-21 LAB — CBC WITH DIFFERENTIAL/PLATELET
Abs Immature Granulocytes: 0.05 10*3/uL (ref 0.00–0.07)
Basophils Absolute: 0 10*3/uL (ref 0.0–0.1)
Basophils Relative: 0 %
Eosinophils Absolute: 0.6 10*3/uL — ABNORMAL HIGH (ref 0.0–0.5)
Eosinophils Relative: 6 %
HCT: 37.9 % — ABNORMAL LOW (ref 39.0–52.0)
Hemoglobin: 12.6 g/dL — ABNORMAL LOW (ref 13.0–17.0)
Immature Granulocytes: 1 %
Lymphocytes Relative: 17 %
Lymphs Abs: 1.6 10*3/uL (ref 0.7–4.0)
MCH: 31.6 pg (ref 26.0–34.0)
MCHC: 33.2 g/dL (ref 30.0–36.0)
MCV: 95 fL (ref 80.0–100.0)
Monocytes Absolute: 0.5 10*3/uL (ref 0.1–1.0)
Monocytes Relative: 5 %
Neutro Abs: 6.8 10*3/uL (ref 1.7–7.7)
Neutrophils Relative %: 71 %
Platelets: 238 10*3/uL (ref 150–400)
RBC: 3.99 MIL/uL — ABNORMAL LOW (ref 4.22–5.81)
RDW: 13.4 % (ref 11.5–15.5)
WBC: 9.5 10*3/uL (ref 4.0–10.5)
nRBC: 0 % (ref 0.0–0.2)

## 2023-08-21 LAB — BASIC METABOLIC PANEL
Anion gap: 10 (ref 5–15)
BUN: 21 mg/dL (ref 8–23)
CO2: 20 mmol/L — ABNORMAL LOW (ref 22–32)
Calcium: 8.8 mg/dL — ABNORMAL LOW (ref 8.9–10.3)
Chloride: 108 mmol/L (ref 98–111)
Creatinine, Ser: 0.94 mg/dL (ref 0.61–1.24)
GFR, Estimated: >60 mL/min (ref >60)
Glucose, Bld: 116 mg/dL — ABNORMAL HIGH (ref 70–99)
Potassium: 3.8 mmol/L (ref 3.5–5.1)
Sodium: 138 mmol/L (ref 135–145)

## 2023-08-21 MED ORDER — OXYMETAZOLINE HCL 0.05 % NA SOLN
3.0000 | Freq: Once | NASAL | Status: AC
  Administered 2023-08-21: 3 via NASAL
  Filled 2023-08-21: qty 30

## 2023-08-21 NOTE — ED Triage Notes (Signed)
PT c/o epistaxis since 0650 today. Pt states taking brillinta. Pt c/o "whoozy feeling" and weakness. Pt nose not bleeding at this time.

## 2023-08-21 NOTE — ED Provider Notes (Signed)
[place] EMERGENCY DEPARTMENT AT [facility] Provider Note   CSN: [id number] Arrival date & time: 08/21/23  0847     History  Chief Complaint  Patient presents with   Epistaxis    Maurice Little is a 67 y.o. male.  HPI 67 year old male with a history of a prior stroke who is on Brilinta presents with a nosebleed.  He states it woke him up with some bleeding out of his left nare at around 6:50 AM.  Then became more and he feels that he lost a lot of blood.  Eventually with applying some ice to his nose and some leaning forward he was able to get it to stop right after he got to the ER room here.  He feels a bit woozy right now.  No nasal trauma or recent URI symptoms.  Never had anything like this before.  No chest pain or shortness of breath.  Home Medications Prior to Admission medications   Medication Sig Start Date End Date Taking? Authorizing Provider  acetaminophen (TYLENOL) 325 MG tablet Take 1-2 tablets (325-650 mg total) by mouth every 4 (four) hours as needed for mild pain. 09/11/21   Love, Evlyn Kanner, PA-C  amLODipine (NORVASC) 5 MG tablet Take 1 tablet (5 mg total) by mouth daily after supper. 12/24/22   Novella Olive, FNP  aspirin EC 81 MG tablet Take 1 tablet (81 mg total) by mouth daily. Swallow whole. 09/27/21   Love, Evlyn Kanner, PA-C  citalopram (CELEXA) 10 MG tablet Take 1 tablet (10 mg total) by mouth daily. 12/23/22   Kirsteins, Victorino Sparrow, MD  Cyanocobalamin (B-12 PO) Take 1 tablet by mouth daily.    [provider]  ezetimibe (ZETIA) 10 MG tablet Take 1 tablet (10 mg total) by mouth daily. 12/23/22   Christell Constant, MD  losartan (COZAAR) 25 MG tablet Take 1 tablet (25 mg total) by mouth daily. 12/24/22   Novella Olive, FNP  pantoprazole (PROTONIX) 40 MG tablet TAKE 1 TABLET BY MOUTH EVERY DAY 11/26/21   de [place], Buren Kos, MD  RESTASIS MULTIDOSE 0.05 % ophthalmic emulsion Place 1 drop into the right eye 2 (two) times daily. 12/23/22   Novella Olive, FNP  rosuvastatin (CRESTOR) 40 MG tablet Take 1 tablet (40 mg total) by mouth daily. 12/23/22   Chandrasekhar, Mahesh A, MD  senna-docusate (SENOKOT-S) 8.6-50 MG tablet Take 2 tablets by mouth 2 (two) times daily. 09/27/21   Love, Evlyn Kanner, PA-C  ticagrelor (BRILINTA) 90 MG TABS tablet Take 1 tablet (90 mg total) by mouth 2 (two) times daily. 02/06/23   Kennieth Francois, PA      Allergies    Duloxetine    Review of Systems   Review of Systems  HENT:  Positive for nosebleeds.   Neurological:  Positive for light-headedness.    Physical Exam Updated Vital Signs BP (!) 137/100   Pulse (!) 105   Temp (!) 97.5 F (36.4 C) (Oral)   Resp (!) 23   Ht 6' (1.829 m)   Wt 114.8 kg   SpO2 99%   BMI 34.32 kg/m  Physical Exam Vitals and nursing note reviewed.  Constitutional:      Appearance: He is well-developed.  HENT:     Head: Normocephalic and atraumatic.     Nose:     Comments: No current bleeding or clots. No clear source or friable tissue Cardiovascular:     Rate and Rhythm: Normal rate and regular  rhythm.     Heart sounds: Normal heart sounds.  Pulmonary:     Effort: Pulmonary effort is normal.  Skin:    General: Skin is warm and dry.  Neurological:     Mental Status: He is alert.     ED Results / Procedures / Treatments   Labs (all labs ordered are listed, but only abnormal results are displayed) Labs Reviewed  BASIC METABOLIC PANEL - Abnormal; Notable for the following components:      Result Value   CO2 20 (*)    Glucose, Bld 116 (*)    Calcium 8.8 (*)    All other components within normal limits  CBC WITH DIFFERENTIAL/PLATELET - Abnormal; Notable for the following components:   RBC 3.99 (*)    Hemoglobin 12.6 (*)    HCT 37.9 (*)    Eosinophils Absolute 0.6 (*)    All other components within normal limits    EKG EKG Interpretation Date/Time:  Thursday August 21 2023 09:14:48 EDT Ventricular Rate:  97 PR Interval:  165 QRS Duration:  85 QT  Interval:  340 QTC Calculation: 432 R Axis:   -46  Text Interpretation: Sinus rhythm  no acute ST/T changes Reconfirmed by Pricilla Loveless [phone]) on 08/21/2023 9:17:48 AM  Radiology No results found.  Procedures Procedures    Medications Ordered in ED Medications  oxymetazoline (AFRIN) 0.05 % nasal spray 3 spray (3 sprays Each Nare Given 08/21/23 0948)    ED Course/ Medical Decision Making/ A&P                                 Medical Decision Making Amount and/or Complexity of Data Reviewed Labs: ordered.    Details: Hemoglobin 12.6, a little lower than February but closer to his baseline from previous chart review. ECG/medicine tests: independent interpretation performed.    Details: No ischemia  Risk OTC drugs.   Patient is well-appearing here.  No bleeding while in the emergency department.  He has been in the ER couple hours and did well with Afrin.  His initial heart rate in triage was 105 but throughout his ED stay he is otherwise been in the 80s.  At this point I think he is stable for discharge with Afrin and to follow-up with ENT.  Advised him to continue the Brilinta.  Discussed return precautions.        Final Clinical Impression(s) / ED Diagnoses Final diagnoses:  Epistaxis    Rx / DC Orders ED Discharge Orders     None         Pricilla Loveless, MD 08/21/23 1104

## 2023-08-21 NOTE — Discharge Instructions (Signed)
You may use the Afrin 2 sprays in each nostril twice a day for the next 48 hours.  Otherwise, follow-up with the ENT.  Call for an appointment.  If you develop recurrent bleeding that does not stop, you develop dizziness or lightheadedness, chest pain, shortness of breath, or any other new/concerning symptoms then return to the ER or call 911.

## 2023-09-12 DIAGNOSIS — R42 Dizziness and giddiness: Secondary | ICD-10-CM | POA: Diagnosis not present

## 2023-09-12 DIAGNOSIS — I69291 Dysphagia following other nontraumatic intracranial hemorrhage: Secondary | ICD-10-CM | POA: Diagnosis not present

## 2023-09-12 DIAGNOSIS — H90A21 Sensorineural hearing loss, unilateral, right ear, with restricted hearing on the contralateral side: Secondary | ICD-10-CM | POA: Diagnosis not present

## 2023-09-12 DIAGNOSIS — R040 Epistaxis: Secondary | ICD-10-CM | POA: Diagnosis not present

## 2023-09-18 ENCOUNTER — Other Ambulatory Visit (HOSPITAL_COMMUNITY): Payer: Self-pay | Admitting: Interventional Radiology

## 2023-09-18 ENCOUNTER — Telehealth (HOSPITAL_COMMUNITY): Payer: Self-pay

## 2023-09-18 DIAGNOSIS — I771 Stricture of artery: Secondary | ICD-10-CM

## 2023-09-18 NOTE — Telephone Encounter (Signed)
Called to schedule cta head/neck, no answer, left vm. AB  

## 2023-10-03 ENCOUNTER — Ambulatory Visit (HOSPITAL_COMMUNITY)
Admission: RE | Admit: 2023-10-03 | Discharge: 2023-10-03 | Disposition: A | Discharge status: Home / Self Care | Payer: Medicare Other | Source: Ambulatory Visit | Attending: Interventional Radiology | Admitting: Interventional Radiology

## 2023-10-03 DIAGNOSIS — H90A21 Sensorineural hearing loss, unilateral, right ear, with restricted hearing on the contralateral side: Secondary | ICD-10-CM | POA: Diagnosis not present

## 2023-10-03 DIAGNOSIS — R040 Epistaxis: Secondary | ICD-10-CM | POA: Diagnosis not present

## 2023-10-03 DIAGNOSIS — R590 Localized enlarged lymph nodes: Secondary | ICD-10-CM | POA: Diagnosis not present

## 2023-10-03 DIAGNOSIS — S82841D Displaced bimalleolar fracture of right lower leg, subsequent encounter for closed fracture with routine healing: Secondary | ICD-10-CM | POA: Diagnosis not present

## 2023-10-03 DIAGNOSIS — I771 Stricture of artery: Secondary | ICD-10-CM | POA: Diagnosis not present

## 2023-10-03 DIAGNOSIS — I6523 Occlusion and stenosis of bilateral carotid arteries: Secondary | ICD-10-CM | POA: Diagnosis not present

## 2023-10-03 DIAGNOSIS — R42 Dizziness and giddiness: Secondary | ICD-10-CM | POA: Diagnosis not present

## 2023-10-03 DIAGNOSIS — I69291 Dysphagia following other nontraumatic intracranial hemorrhage: Secondary | ICD-10-CM | POA: Diagnosis not present

## 2023-10-03 DIAGNOSIS — I6782 Cerebral ischemia: Secondary | ICD-10-CM | POA: Diagnosis not present

## 2023-10-03 DIAGNOSIS — I672 Cerebral atherosclerosis: Secondary | ICD-10-CM | POA: Diagnosis not present

## 2023-10-03 DIAGNOSIS — D4819 Other specified neoplasm of uncertain behavior of connective and other soft tissue: Secondary | ICD-10-CM | POA: Diagnosis not present

## 2023-10-03 MED ORDER — SODIUM CHLORIDE (PF) 0.9 % IJ SOLN
INTRAMUSCULAR | Status: AC
  Filled 2023-10-03: qty 50

## 2023-10-03 MED ORDER — IOHEXOL 350 MG/ML SOLN
75.0000 mL | Freq: Once | INTRAVENOUS | Status: AC | PRN
  Administered 2023-10-03: 75 mL via INTRAVENOUS

## 2023-10-07 ENCOUNTER — Other Ambulatory Visit: Payer: Self-pay | Admitting: Physical Medicine & Rehabilitation

## 2023-10-21 ENCOUNTER — Telehealth: Payer: Self-pay | Admitting: Physician Assistant

## 2023-10-21 NOTE — Telephone Encounter (Signed)
  CTA images reviewed by Dr. Corliss Skains  Recommend repeat CTA in 1 year.  Blossom Crume S Korban Shearer PA-C 10/21/2023 3:04 PM

## 2023-10-22 ENCOUNTER — Encounter: Payer: Self-pay | Admitting: Family Medicine

## 2023-10-30 ENCOUNTER — Other Ambulatory Visit (HOSPITAL_BASED_OUTPATIENT_CLINIC_OR_DEPARTMENT_OTHER): Payer: Self-pay | Admitting: *Deleted

## 2023-10-31 ENCOUNTER — Other Ambulatory Visit (HOSPITAL_BASED_OUTPATIENT_CLINIC_OR_DEPARTMENT_OTHER): Payer: Self-pay | Admitting: Family Medicine

## 2023-10-31 DIAGNOSIS — I10 Essential (primary) hypertension: Secondary | ICD-10-CM

## 2023-10-31 MED ORDER — AMLODIPINE BESYLATE 5 MG PO TABS: 5.0000 mg | ORAL_TABLET | Freq: Every day | ORAL | 0 refills | Status: DC

## 2023-10-31 NOTE — Telephone Encounter (Signed)
 Copied from CRM [id number]. Topic: Clinical - Medication Refill >> Oct 31, 2023  9:36 AM Antonio DEL wrote: Most Recent Primary Care Visit:  Provider: BOOKER DARICE SAUNDERS  Department: [facility]  Visit Type: TELEPHONE OFFICE VISIT  Date: 11/26/2022  Medication: ***  Has the patient contacted their pharmacy?  (Agent: If no, request that the patient contact the pharmacy for the refill. If patient does not wish to contact the pharmacy document the reason why and proceed with request.) (Agent: If yes, when and what did the pharmacy advise?)  Is this the correct pharmacy for this prescription?  If no, delete pharmacy and type the correct one.  This is the patient's preferred pharmacy:  [id number] [place], [place] - [address] AT [facility] OF [address] [place] [address] Phone: [phone] Fax: [phone]   Has the prescription been filled recently?   Is the patient out of the medication?   Has the patient been seen for an appointment in the last year OR does the patient have an upcoming appointment?   Can we respond through MyChart?   Agent: Please be advised that Rx refills may take up to 3 business days. We ask that you follow-up with your pharmacy.

## 2023-11-06 ENCOUNTER — Telehealth (HOSPITAL_BASED_OUTPATIENT_CLINIC_OR_DEPARTMENT_OTHER): Payer: Self-pay | Admitting: Family Medicine

## 2023-11-06 NOTE — Telephone Encounter (Signed)
 We have not seen the patient is due for a visit. Pt is scheduled 1/10 will need to keep this appt

## 2023-11-06 NOTE — Telephone Encounter (Signed)
 Pt called to see if he could request documentation saying that he is unable to participate in jury duty due to having a stroke. He stated the courts need a doctor's note as proof. Please advise patient

## 2023-11-07 ENCOUNTER — Encounter (HOSPITAL_BASED_OUTPATIENT_CLINIC_OR_DEPARTMENT_OTHER): Payer: Self-pay | Admitting: *Deleted

## 2023-11-07 ENCOUNTER — Encounter (HOSPITAL_BASED_OUTPATIENT_CLINIC_OR_DEPARTMENT_OTHER): Payer: Self-pay | Admitting: Family Medicine

## 2023-11-07 ENCOUNTER — Ambulatory Visit (INDEPENDENT_AMBULATORY_CARE_PROVIDER_SITE_OTHER): Payer: Medicare Other | Admitting: Family Medicine

## 2023-11-07 VITALS — BP 132/84 | HR 67 | Ht 72.0 in | Wt 265.6 lb

## 2023-11-07 DIAGNOSIS — I10 Essential (primary) hypertension: Secondary | ICD-10-CM | POA: Diagnosis not present

## 2023-11-07 DIAGNOSIS — Z1211 Encounter for screening for malignant neoplasm of colon: Secondary | ICD-10-CM | POA: Diagnosis not present

## 2023-11-07 MED ORDER — AMLODIPINE BESYLATE 5 MG PO TABS
5.0000 mg | ORAL_TABLET | Freq: Every day | ORAL | 3 refills | Status: AC
Start: 2023-11-07 — End: ?

## 2023-11-07 NOTE — Assessment & Plan Note (Signed)
 Patient continues with medications as prescribed.  Does not have any concerns today.  Denies any chest pain, shortness of breath.  He is requesting refill of amlodipine  today. Blood pressure is appropriate in office today, can continue with current medication regimen, refill of amlodipine  sent to to pharmacy on file.

## 2023-11-07 NOTE — Patient Instructions (Signed)
   Medication Instructions:  Your physician recommends that you continue on your current medications as directed. Please refer to the Current Medication list given to you today. --If you need a refill on any your medications before your next appointment, please call your pharmacy first. If no refills are authorized on file call the office.--   Follow-Up: Your next appointment:   Your physician recommends that you schedule a follow-up appointment in: 1 year physical with Dr. de Peru  You will receive a text message or e-mail with a link to a survey about your care and experience with [place] today! We would greatly appreciate your feedback!   Thanks for letting us be apart of your health journey!!  Primary Care and Sports Medicine   Dr. Ceasar Mons [place]   We encourage you to activate your patient portal called "MyChart".  Sign up information is provided on this After Visit Summary.  MyChart is used to connect with patients for Virtual Visits (Telemedicine).  Patients are able to view lab/test results, encounter notes, upcoming appointments, etc.  Non-urgent messages can be sent to your provider as well. To learn more about what you can do with MyChart, please visit --  [email or web address].

## 2023-11-07 NOTE — Progress Notes (Signed)
    Procedures performed today:    None.  Independent interpretation of notes and tests performed by another provider:   None.  Brief History, Exam, Impression, and Recommendations:    BP 132/84 (BP Location: Right Arm, Patient Position: Sitting, Cuff Size: Normal)   Pulse 67   Ht 6' (1.829 m)   Wt 265 lb 9.6 oz (120.5 kg)   SpO2 98%   BMI 36.02 kg/m   Special screening for malignant neoplasms, colon -     Cologuard  Essential hypertension Assessment & Plan: Patient continues with medications as prescribed.  Does not have any concerns today.  Denies any chest pain, shortness of breath.  He is requesting refill of amlodipine  today. Blood pressure is appropriate in office today, can continue with current medication regimen, refill of amlodipine  sent to to pharmacy on file.  Orders: -     amLODIPine  Besylate; Take 1 tablet (5 mg total) by mouth daily after supper. Schedule an appointment for further refills.  Dispense: 90 tablet; Refill: 3  Patient reports needing a letter to be excused from jury duty.  He indicates that he did attempt to fill out information related to his history of stroke in order to be excused from jury duty, however they are requiring letter from doctor for excuse.  Letter provided to patient today stating that he is not able to perform this.  He related to his chronic medical issues, including history of stroke.   ___________________________________________ Jazzmine Kleiman de [place], MD, ABFM, CAQSM Primary Care and Sports Medicine [facility]

## 2023-11-13 ENCOUNTER — Telehealth (HOSPITAL_COMMUNITY): Payer: Self-pay

## 2023-11-13 NOTE — Telephone Encounter (Signed)
Pt's wife agreed to f/u in 1 year with a cta head/neck. AB

## 2023-11-17 DIAGNOSIS — Z1211 Encounter for screening for malignant neoplasm of colon: Secondary | ICD-10-CM | POA: Diagnosis not present

## 2023-11-24 LAB — COLOGUARD: COLOGUARD: NEGATIVE

## 2023-12-26 ENCOUNTER — Ambulatory Visit (INDEPENDENT_AMBULATORY_CARE_PROVIDER_SITE_OTHER): Payer: Medicare Other | Admitting: Family Medicine

## 2023-12-26 ENCOUNTER — Encounter (HOSPITAL_BASED_OUTPATIENT_CLINIC_OR_DEPARTMENT_OTHER): Payer: Self-pay | Admitting: Family Medicine

## 2023-12-26 VITALS — BP 147/90 | HR 70 | Temp 97.9°F | Ht 72.0 in | Wt 247.4 lb

## 2023-12-26 DIAGNOSIS — D2322 Other benign neoplasm of skin of left ear and external auricular canal: Secondary | ICD-10-CM | POA: Diagnosis not present

## 2023-12-26 DIAGNOSIS — Z0000 Encounter for general adult medical examination without abnormal findings: Secondary | ICD-10-CM | POA: Diagnosis not present

## 2023-12-26 DIAGNOSIS — R040 Epistaxis: Secondary | ICD-10-CM | POA: Diagnosis not present

## 2023-12-26 DIAGNOSIS — H903 Sensorineural hearing loss, bilateral: Secondary | ICD-10-CM | POA: Diagnosis not present

## 2023-12-26 DIAGNOSIS — H6123 Impacted cerumen, bilateral: Secondary | ICD-10-CM | POA: Diagnosis not present

## 2023-12-26 NOTE — Progress Notes (Signed)
 Subjective:    Maurice Little is a 68 y.o. male who presents for a Welcome to Medicare exam.   Cardiac Risk Factors include: advanced age (>34men, >35 women);obesity (BMI >30kg/m2);sedentary lifestyle     Objective:    Today's Vitals   12/26/23 0914 12/26/23 0923  BP: (!) 146/84   Pulse: 70   Temp: 97.9 F (36.6 C)   TempSrc: Oral   SpO2: 97%   Weight: 247 lb 6.4 oz (112.2 kg)   Height: 6' (1.829 m)   PainSc:  0-No pain   Body mass index is 33.55 kg/m.  Medications Outpatient Encounter Medications as of 12/26/2023  Medication Sig   acetaminophen (TYLENOL) 325 MG tablet Take 1-2 tablets (325-650 mg total) by mouth every 4 (four) hours as needed for mild pain.   amLODipine (NORVASC) 5 MG tablet Take 1 tablet (5 mg total) by mouth daily after supper. Schedule an appointment for further refills.   aspirin EC 81 MG tablet Take 1 tablet (81 mg total) by mouth daily. Swallow whole.   citalopram (CELEXA) 10 MG tablet TAKE 1 TABLET(10 MG) BY MOUTH DAILY   Cyanocobalamin (B-12 PO) Take 1 tablet by mouth daily.   ezetimibe (ZETIA) 10 MG tablet Take 1 tablet (10 mg total) by mouth daily.   losartan (COZAAR) 25 MG tablet Take 1 tablet (25 mg total) by mouth daily.   pantoprazole (PROTONIX) 40 MG tablet TAKE 1 TABLET BY MOUTH EVERY DAY   RESTASIS MULTIDOSE 0.05 % ophthalmic emulsion Place 1 drop into the right eye 2 (two) times daily.   rosuvastatin (CRESTOR) 40 MG tablet Take 1 tablet (40 mg total) by mouth daily.   senna-docusate (SENOKOT-S) 8.6-50 MG tablet Take 2 tablets by mouth 2 (two) times daily.   ticagrelor (BRILINTA) 90 MG TABS tablet Take 1 tablet (90 mg total) by mouth 2 (two) times daily.   No facility-administered encounter medications on file as of 12/26/2023.     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   PAD (peripheral artery disease) (HCC)    [facility]    Past Surgical History:  Procedure Laterality Date   BRAIN SURGERY  August 30 2022   Stints   BUBBLE STUDY  08/31/2021   Procedure: BUBBLE STUDY;  Surgeon: Chilton Si, MD;  Location: [facility] ENDOSCOPY;  Service: Cardiovascular;;   ILIAC ARTERY STENT Left    IR ANGIO VERTEBRAL SEL SUBCLAVIAN INNOMINATE UNI L MOD SED  08/30/2021   IR ANGIO VERTEBRAL SEL SUBCLAVIAN INNOMINATE UNI R MOD SED  08/30/2021   IR CT HEAD LTD  08/30/2021   IR INTRA CRAN STENT  08/30/2021   IR RADIOLOGIST EVAL & MGMT  11/28/2021   IR RADIOLOGIST EVAL & MGMT  01/12/2022   IR US GUIDE VASC ACCESS RIGHT  08/30/2021   RADIOLOGY WITH ANESTHESIA N/A 08/30/2021   Procedure: IR WITH ANESTHESIA;  Surgeon: Julieanne Cotton, MD;  Location: [facility] OR;  Service: Radiology;  Laterality: N/A;   TEE WITHOUT CARDIOVERSION N/A 08/31/2021   Procedure: TRANSESOPHAGEAL ECHOCARDIOGRAM (TEE);  Surgeon: Chilton Si, MD;  Location: [facility] ENDOSCOPY;  Service: Cardiovascular;  Laterality: N/A;    Family History  Problem Relation Age of Onset   Healthy  Mother    Social History   Occupational History   Not on file  Tobacco Use   Smoking status: Former    Current packs/day: 0.00    Types: Cigarettes    Passive exposure: Past   Smokeless tobacco: Never   Tobacco comments:    Quit 2000  Vaping Use   Vaping status: Never Used  Substance and Sexual Activity   Alcohol use: Not Currently   Drug use: Never   Sexual activity: Not Currently    Birth control/protection: Abstinence    Tobacco Counseling Counseling given: Not Answered Tobacco comments: Quit 2000   Immunizations and Health Maintenance Immunization History  Administered Date(s) Administered   PFIZER(Purple Top)SARS-COV-2 Vaccination 01/22/2020, 02/15/2020   There are no  preventive care reminders to display for this patient.   Activities of Daily Living    12/26/2023    9:28 AM 12/19/2023    9:09 AM  In your present state of health, do you have any difficulty performing the following activities:  Hearing? 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 1 0  Comment difficulty seeing with right eye from the stroke   Difficulty concentrating or making decisions? 0 0  Walking or climbing stairs? 1 1  Dressing or bathing? 1 1  Doing errands, shopping? 0 1  Preparing Food and eating ? N Y  Using the Toilet? N N  In the past six months, have you accidently leaked urine? N N  Do you have problems with loss of bowel control? N N  Managing your Medications? N N  Managing your Finances? N N  Housekeeping or managing your Housekeeping? Maurice Little    Physical Exam   Physical Exam Constitutional:      General: He is not in acute distress.    Appearance: Normal appearance.  HENT:     Head: Normocephalic and atraumatic.  Cardiovascular:     Rate and Rhythm: Normal rate.  Pulmonary:     Effort: Pulmonary effort is normal. No respiratory distress.     Breath sounds: Normal breath sounds.  Neurological:     Mental Status: He is alert.    (optional), or other factors deemed appropriate based on the beneficiary's medical and social history and current clinical standards.   Advanced Directives: Does Patient Have a Medical Advance Directive?: Yes Type of Advance Directive: Healthcare Power of Attorney, Living will Does patient want to make changes to medical advance directive?: No - Patient declined Copy of Healthcare Power of Attorney in Chart?: No - copy requested   EKG:  unchanged from previous tracings, sinus bradycardia     Assessment:    This is a routine wellness  examination for this patient.  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 1  Number falls in past yr: 0  Injury with Fall? 1  Risk for fall due to : History of fall(s);Impaired balance/gait;Impaired mobility  Follow up Falls evaluation completed   Cognitive Function        12/26/2023    9:31 AM  6CIT Screen  What Year? 0 points  What month? 3 points  What time? 0 points  Count back from 20 0 points  Months in reverse 2 points  Repeat phrase 2 points  Total Score 7 points    Patient Care Team: de [place], Buren Kos, MD as PCP - General (Family Medicine) Christell Constant, MD as PCP - Cardiology (Cardiology)     Plan:     I have personally reviewed and noted the following in the patient's chart:   Medical and social history Use of alcohol, tobacco or illicit drugs  Current medications and supplements Functional ability and status Nutritional status Physical activity Advanced directives List of other physicians Hospitalizations, surgeries, and ER visits in previous 12 months Vitals Screenings to include cognitive, depression, and falls Referrals and appointments  In addition, I have reviewed and discussed with patient certain preventive protocols, quality metrics, and best practice recommendations. A written personalized care plan for preventive services as well as general preventive health recommendations were provided to patient.   Dynasty Holquin J De [place], MD 12/26/2023

## 2023-12-26 NOTE — Patient Instructions (Signed)
  Medication Instructions:  Your physician recommends that you continue on your current medications as directed. Please refer to the Current Medication list given to you today. --If you need a refill on any your medications before your next appointment, please call your pharmacy first. If no refills are authorized on file call the office.--   Follow-Up: Your next appointment:   Your physician recommends that you schedule a follow-up appointment in: appointment scheduled  with Dr. de Peru  You will receive a text message or e-mail with a link to a survey about your care and experience with [place] today! We would greatly appreciate your feedback!   Thanks for letting us be apart of your health journey!!  Primary Care and Sports Medicine   Dr. Ceasar Mons Peru   We encourage you to activate your patient portal called "MyChart".  Sign up information is provided on this After Visit Summary.  MyChart is used to connect with patients for Virtual Visits (Telemedicine).  Patients are able to view lab/test results, encounter notes, upcoming appointments, etc.  Non-urgent messages can be sent to your provider as well. To learn more about what you can do with MyChart, please visit --  [email or web address].

## 2024-01-10 ENCOUNTER — Other Ambulatory Visit: Payer: Self-pay | Admitting: Internal Medicine

## 2024-01-11 ENCOUNTER — Other Ambulatory Visit: Payer: Self-pay | Admitting: Family Medicine

## 2024-01-11 ENCOUNTER — Encounter (HOSPITAL_BASED_OUTPATIENT_CLINIC_OR_DEPARTMENT_OTHER): Payer: Self-pay | Admitting: Family Medicine

## 2024-01-12 ENCOUNTER — Other Ambulatory Visit (HOSPITAL_BASED_OUTPATIENT_CLINIC_OR_DEPARTMENT_OTHER): Payer: Self-pay | Admitting: *Deleted

## 2024-01-12 DIAGNOSIS — I10 Essential (primary) hypertension: Secondary | ICD-10-CM

## 2024-01-12 MED ORDER — LOSARTAN POTASSIUM 25 MG PO TABS: 25.0000 mg | ORAL_TABLET | Freq: Every day | ORAL | 1 refills | Status: DC

## 2024-01-12 MED ORDER — RESTASIS MULTIDOSE 0.05 % OP EMUL: 1.0000 [drp] | Freq: Two times a day (BID) | OPHTHALMIC | 0 refills | Status: AC

## 2024-01-19 ENCOUNTER — Encounter (HOSPITAL_BASED_OUTPATIENT_CLINIC_OR_DEPARTMENT_OTHER): Payer: Self-pay | Admitting: Family Medicine

## 2024-01-19 ENCOUNTER — Telehealth (HOSPITAL_COMMUNITY): Payer: Self-pay | Admitting: Student

## 2024-01-19 MED ORDER — TICAGRELOR 90 MG PO TABS: 90.0000 mg | ORAL_TABLET | Freq: Two times a day (BID) | ORAL | 3 refills | Status: DC

## 2024-01-19 NOTE — Telephone Encounter (Signed)
 Brilinta refill e-prescribed to the patient's [employer].  Alwyn Ren, AGACNP-BC 01/19/2024, 12:11 PM

## 2024-01-22 ENCOUNTER — Telehealth (HOSPITAL_COMMUNITY): Payer: Self-pay | Admitting: Student

## 2024-01-22 NOTE — Telephone Encounter (Signed)
 Per Dr. Corliss Skains patient may discontinue brilinta. He must remain on 81 mg aspirin. Patient made aware via telephone.   Alwyn Ren, AGACNP-BC 01/22/2024, 10:51 AM .

## 2024-01-23 ENCOUNTER — Ambulatory Visit: Payer: Medicare Other | Attending: Internal Medicine | Admitting: Internal Medicine

## 2024-01-23 VITALS — BP 130/90 | HR 80 | Ht 72.0 in | Wt 265.0 lb

## 2024-01-23 DIAGNOSIS — E7801 Familial hypercholesterolemia: Secondary | ICD-10-CM | POA: Diagnosis not present

## 2024-01-23 DIAGNOSIS — I739 Peripheral vascular disease, unspecified: Secondary | ICD-10-CM

## 2024-01-23 DIAGNOSIS — I10 Essential (primary) hypertension: Secondary | ICD-10-CM

## 2024-01-23 NOTE — Progress Notes (Signed)
 Cardiology Office Note:    Date:  01/23/2024   ID:  Maurice Little, DOB 08-Aug-1956, MRN [id number]  PCP:  de [place], Buren Kos, MD   [facility] Providers Cardiologist:  Christell Constant, MD     Referring MD: de Peru, Raymond J, MD   CC: PAD  History of Present Illness:    Maurice Little is a 68 y.o. male with a hx of HTN, FH, PAD s/p Ilaiac Artery Stenosis (previously saw Dr. Lucky Cowboy Med with 2016 10 x 37 mm expandable stent to the left common iliac artery 95% stenosis.  20% right common iliac artery stenosis)  Had a 11/8//22 Cerebellar stroke with small PFO seen.   2023: had no AF on monitor; asymptomatic SVT. LDL at goal.  Patient asked for earlier follow up for worsening leg pain. 2024: Claudication has resolved with cilastazol.  Patient notes that he is doing well.   Since last visit notes no symptoms. There are no interval hospital/ED visit.    No chest pain or pressure .  No SOB/DOE and no PND/Orthopnea.  No weight gain or leg swelling.  No palpitations or syncope.  He is significantly more active then when I met him back in 2022-2023.   Past Medical History:  Diagnosis Date   Benign prostatic hyperplasia with elevated prostate specific antigen (PSA)    Closed bimalleolar fracture of right ankle 07/17/2023   Closed fracture of distal end of right fibula 07/08/2023   Hypertension    Iliac artery stenosis, left (HCC)    with claudication resolved with stent   PAD (peripheral artery disease) (HCC)    Stroke [facility])     Past Surgical History:  Procedure Laterality Date   BRAIN SURGERY  August 30 2022   Stints   BUBBLE STUDY  08/31/2021   Procedure: BUBBLE STUDY;  Surgeon: Chilton Si, MD;  Location: [facility] ENDOSCOPY;  Service: Cardiovascular;;   ILIAC ARTERY STENT Left    IR ANGIO VERTEBRAL SEL SUBCLAVIAN INNOMINATE UNI L MOD SED  08/30/2021   IR ANGIO VERTEBRAL SEL SUBCLAVIAN INNOMINATE UNI R MOD SED  08/30/2021   IR CT HEAD LTD  08/30/2021   IR INTRA CRAN STENT   08/30/2021   IR RADIOLOGIST EVAL & MGMT  11/28/2021   IR RADIOLOGIST EVAL & MGMT  01/12/2022   IR US GUIDE VASC ACCESS RIGHT  08/30/2021   RADIOLOGY WITH ANESTHESIA N/A 08/30/2021   Procedure: IR WITH ANESTHESIA;  Surgeon: Julieanne Cotton, MD;  Location: [facility] OR;  Service: Radiology;  Laterality: N/A;   TEE WITHOUT CARDIOVERSION N/A 08/31/2021   Procedure: TRANSESOPHAGEAL ECHOCARDIOGRAM (TEE);  Surgeon: Chilton Si, MD;  Location: [facility] ENDOSCOPY;  Service: Cardiovascular;  Laterality: N/A;    Current Medications: Current Meds  Medication Sig   acetaminophen (TYLENOL) 325 MG tablet Take 1-2 tablets (325-650 mg total) by mouth every 4 (four) hours as needed for mild pain.   amLODipine (NORVASC) 5 MG tablet Take 1 tablet (5 mg total) by mouth daily after supper. Schedule an appointment for further refills.   aspirin EC 81 MG tablet Take 1 tablet (81 mg total) by mouth daily. Swallow whole.   citalopram (CELEXA) 10 MG tablet TAKE 1 TABLET(10 MG) BY MOUTH DAILY   Cyanocobalamin (B-12 PO) Take 1 tablet by mouth daily.   losartan (COZAAR) 25 MG tablet Take 1 tablet (25 mg total) by mouth daily.   pantoprazole (PROTONIX) 40 MG tablet TAKE 1 TABLET BY MOUTH EVERY DAY   RESTASIS MULTIDOSE 0.05 %  ophthalmic emulsion Place 1 drop into the right eye 2 (two) times daily.   rosuvastatin (CRESTOR) 40 MG tablet Take 1 tablet (40 mg total) by mouth daily.   senna-docusate (SENOKOT-S) 8.6-50 MG tablet Take 2 tablets by mouth 2 (two) times daily.   [DISCONTINUED] ezetimibe (ZETIA) 10 MG tablet Take 1 tablet (10 mg total) by mouth daily.     Allergies:   Duloxetine   Social History   Socioeconomic History   Marital status: Married    Spouse name: Jody   Number of children: Not on file   Years of education: Not on file   Highest education level: Bachelor's degree (e.g., BA, AB, BS)  Occupational History   Not on file  Tobacco Use   Smoking status: Former    Current packs/day: 0.00    Types:  Cigarettes    Passive exposure: Past   Smokeless tobacco: Never   Tobacco comments:    Quit 2000  Vaping Use   Vaping status: Never Used  Substance and Sexual Activity   Alcohol use: Not Currently   Drug use: Never   Sexual activity: Not Currently    Birth control/protection: Abstinence  Other Topics Concern   Not on file  Social History Narrative   Lives with wife   Social Drivers of Health   Financial Resource Strain: Low Risk  (11/04/2023)   Overall Financial Resource Strain (CARDIA)    Difficulty of Paying Living Expenses: Not hard at all  Food Insecurity: No Food Insecurity (11/04/2023)   Hunger Vital Sign    Worried About Running Out of Food in the Last Year: Never true    Ran Out of Food in the Last Year: Never true  Transportation Needs: No Transportation Needs (11/04/2023)   PRAPARE - Administrator, Civil Service (Medical): No    Lack of Transportation (Non-Medical): No  Physical Activity: Unknown (11/04/2023)   Exercise Vital Sign    Days of Exercise per Week: Patient declined    Minutes of Exercise per Session: Not on file  Stress: No Stress Concern Present (11/04/2023)   [employer] of Occupational Health - Occupational Stress Questionnaire    Feeling of Stress : Not at all  Social Connections: Moderately Isolated (11/04/2023)   Social Connection and Isolation Panel [NHANES]    Frequency of Communication with Friends and Family: More than three times a week    Frequency of Social Gatherings with Friends and Family: More than three times a week    Attends Religious Services: Never    Database administrator or Organizations: No    Attends Engineer, structural: Not on file    Marital Status: Married    Social: Comes with wife no kids, Former saw Dr. Smith Robert  Family History: The patient's family history includes Healthy in his mother. No prior history of CAD in family  ROS:   Please see the history of present illness.     EKGs/Labs/Other  Studies Reviewed:    Cardiac Studies & Procedures   ______________________________________________________________________________________________     ECHOCARDIOGRAM  ECHOCARDIOGRAM COMPLETE 08/30/2021  Narrative ECHOCARDIOGRAM REPORT    Patient Name:   YASSIR ENIS Date of Exam: 08/30/2021 Medical Rec #:  [id number]  Height:       72.0 in Accession #:    [id number] Weight:       202.8 lb Date of Birth:  02/10/1956   BSA:          2.143 m Patient Age:  65 years   BP:           155/93 mmHg Patient Gender: M          HR:           76 bpm. Exam Location:  Inpatient  Procedure: 2D Echo, Color Doppler and Cardiac Doppler  Indications:    Eval for PFO  History:        Patient has no prior history of Echocardiogram examinations.  Sonographer:    Cleatis Polka Referring Phys: [id number] KRISTI KEHOE  IMPRESSIONS   1. Left ventricular ejection fraction, by estimation, is 60 to 65%. The left ventricle has normal function. The left ventricle has no regional wall motion abnormalities. Left ventricular diastolic parameters are consistent with Grade II diastolic dysfunction (pseudonormalization). 2. Right ventricular systolic function is normal. The right ventricular size is normal. Tricuspid regurgitation signal is inadequate for assessing PA pressure. 3. The mitral valve is normal in structure. No evidence of mitral valve regurgitation. No evidence of mitral stenosis. 4. The aortic valve is tricuspid. Aortic valve regurgitation is not visualized. Mild aortic valve sclerosis is present, with no evidence of aortic valve stenosis. 5. The inferior vena cava is normal in size with greater than 50% respiratory variability, suggesting right atrial pressure of 3 mmHg.  FINDINGS Left Ventricle: Left ventricular ejection fraction, by estimation, is 60 to 65%. The left ventricle has normal function. The left ventricle has no regional wall motion abnormalities. The left ventricular internal cavity size  was normal in size. There is no left ventricular hypertrophy. Left ventricular diastolic parameters are consistent with Grade II diastolic dysfunction (pseudonormalization).  Right Ventricle: The right ventricular size is normal. No increase in right ventricular wall thickness. Right ventricular systolic function is normal. Tricuspid regurgitation signal is inadequate for assessing PA pressure.  Left Atrium: Left atrial size was normal in size.  Right Atrium: Right atrial size was normal in size.  Pericardium: There is no evidence of pericardial effusion.  Mitral Valve: The mitral valve is normal in structure. No evidence of mitral valve regurgitation. No evidence of mitral valve stenosis.  Tricuspid Valve: The tricuspid valve is normal in structure. Tricuspid valve regurgitation is not demonstrated.  Aortic Valve: The aortic valve is tricuspid. Aortic valve regurgitation is not visualized. Mild aortic valve sclerosis is present, with no evidence of aortic valve stenosis. Aortic valve peak gradient measures 9.4 mmHg.  Pulmonic Valve: The pulmonic valve was normal in structure. Pulmonic valve regurgitation is trivial.  Aorta: The aortic root is normal in size and structure.  Venous: The inferior vena cava is normal in size with greater than 50% respiratory variability, suggesting right atrial pressure of 3 mmHg.  IAS/Shunts: No atrial level shunt detected by color flow Doppler.   LEFT VENTRICLE PLAX 2D LVIDd:         4.40 cm      Diastology LVIDs:         2.80 cm      LV e' medial:    7.07 cm/s LV PW:         1.00 cm      LV E/e' medial:  8.9 LV IVS:        1.00 cm      LV e' lateral:   6.85 cm/s LVOT diam:     2.10 cm      LV E/e' lateral: 9.2 LV SV:         84 LV SV Index:   39 LVOT Area:  3.46 cm  LV Volumes (MOD) LV vol d, MOD A2C: 96.1 ml LV vol d, MOD A4C: 107.0 ml LV vol s, MOD A2C: 43.0 ml LV vol s, MOD A4C: 44.4 ml LV SV MOD A2C:     53.1 ml LV SV MOD A4C:      107.0 ml LV SV MOD BP:      58.1 ml  RIGHT VENTRICLE             IVC RV Basal diam:  2.60 cm     IVC diam: 1.70 cm RV Mid diam:    2.50 cm RV S prime:     15.40 cm/s TAPSE (M-mode): 2.5 cm  LEFT ATRIUM           Index        RIGHT ATRIUM           Index LA diam:      3.60 cm 1.68 cm/m   RA Area:     14.90 cm LA Vol (A2C): 37.2 ml 17.36 ml/m  RA Volume:   29.30 ml  13.67 ml/m LA Vol (A4C): 50.2 ml 23.42 ml/m AORTIC VALVE AV Area (Vmax): 2.94 cm AV Vmax:        153.00 cm/s AV Peak Grad:   9.4 mmHg LVOT Vmax:      130.00 cm/s LVOT Vmean:     73.000 cm/s LVOT VTI:       0.243 m  AORTA Ao Root diam: 2.90 cm Ao Asc diam:  3.40 cm  MITRAL VALVE MV Area (PHT): 3.31 cm    SHUNTS MV Decel Time: 229 msec    Systemic VTI:  0.24 m MV E velocity: 62.90 cm/s  Systemic Diam: 2.10 cm MV A velocity: 67.00 cm/s MV E/A ratio:  0.94  Dalton McleanMD Electronically signed by Wilfred Lacy Signature Date/Time: 08/30/2021/2:45:21 PM    Final   TEE  ECHO TEE 08/31/2021  Narrative TRANSESOPHOGEAL ECHO REPORT    Patient Name:   CLEBERT WENGER Date of Exam: 08/31/2021 Medical Rec #:  [id number]  Height:       72.0 in Accession #:    [id number] Weight:       202.8 lb Date of Birth:  1956/06/27   BSA:          2.143 m Patient Age:    65 years   BP:           127/74 mmHg Patient Gender: M          HR:           97 bpm. Exam Location:  Inpatient  Procedure: Cardiac Doppler, Color Doppler, Transesophageal Echo and Saline Contrast Bubble Study  Indications:     Stroke  History:         Patient has prior history of Echocardiogram examinations, most recent 08/30/2021.  Sonographer:     Roosvelt Maser RDCS Referring Phys:  [id number] RHONDA G BARRETT Diagnosing Phys: Chilton Si MD  PROCEDURE: After discussion of the risks and benefits of a TEE, an informed consent was obtained from the patient. The transesophogeal probe was passed without difficulty through the esophogus of the patient.  Local oropharyngeal anesthetic was provided with Cetacaine. Sedation performed by different physician. The patient was monitored while under deep sedation. The patient's vital signs; including heart rate, blood pressure, and oxygen saturation; remained stable throughout the procedure. The patient developed no complications during the procedure.  IMPRESSIONS   1. Left ventricular ejection fraction, by estimation, is 55  to 60%. The left ventricle has normal function. The left ventricle has no regional wall motion abnormalities. 2. Right ventricular systolic function is normal. The right ventricular size is normal. 3. No left atrial/left atrial appendage thrombus was detected. 4. The mitral valve is normal in structure. Trivial mitral valve regurgitation. No evidence of mitral stenosis. 5. The aortic valve is tricuspid. Aortic valve regurgitation is not visualized. No aortic stenosis is present. 6. 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 7. The inferior vena cava is normal in size with greater than 50% respiratory variability, suggesting right atrial pressure of 3 mmHg. 8. Agitated saline contrast bubble study was positive with shunting observed within 3-6 cardiac cycles suggestive of interatrial shunt. There is a small patent foramen ovale with predominantly right to left shunting across the atrial septum.  Conclusion(s)/Recommendation(s): Normal biventricular function without evidence of hemodynamically significant valvular heart disease.  FINDINGS Left Ventricle: Left ventricular ejection fraction, by estimation, is 55 to 60%. The left ventricle has normal function. The left ventricle has no regional wall motion abnormalities. The left ventricular internal cavity size was normal in size. There is no left ventricular hypertrophy.  Right Ventricle: The right ventricular size is normal. No increase in right ventricular wall thickness. Right ventricular  systolic function is normal.  Left Atrium: Left atrial size was normal in size. No left atrial/left atrial appendage thrombus was detected.  Right Atrium: Right atrial size was normal in size.  Pericardium: There is no evidence of pericardial effusion.  Mitral Valve: The mitral valve is normal in structure. Trivial mitral valve regurgitation. No evidence of mitral valve stenosis.  Tricuspid Valve: The tricuspid valve is normal in structure. Tricuspid valve regurgitation is not demonstrated. No evidence of tricuspid stenosis.  Aortic Valve: The aortic valve is tricuspid. Aortic valve regurgitation is not visualized. No aortic stenosis is present.  Pulmonic Valve: The pulmonic valve was normal in structure. Pulmonic valve regurgitation is trivial. No evidence of pulmonic stenosis.  Aorta: The aortic root is normal in size and structure. There is moderate (Grade III) atheroma plaque involving the descending aorta.  Venous: The inferior vena cava is normal in size with greater than 50% respiratory variability, suggesting right atrial pressure of 3 mmHg.  IAS/Shunts: No atrial level shunt detected by color flow Doppler. Agitated saline contrast was given intravenously to evaluate for intracardiac shunting. Agitated saline contrast bubble study was positive with shunting observed within 3-6 cardiac cycles suggestive of interatrial shunt. A small patent foramen ovale is detected with predominantly right to left shunting across the atrial septum.  Chilton Si MD Electronically signed by Chilton Si MD Signature Date/Time: 09/05/2021/11:24:08 AM    Final  MONITORS  LONG TERM MONITOR (3-14 DAYS) 11/29/2021  Narrative  Patient had a minimum heart rate of 47 bpm, maximum heart rate of 158 bpm (SVT), and average heart rate of 71 bpm.  Predominant underlying rhythm was sinus rhythm.  Eight runs of SVT occurred lasting 11 seconds at longest with a max rate of 158 bpm at fastest.   Isolated PACs were rare (<1.0%).  Isolated PVCs were rare (<1.0%).  Triggered and diary events associated with sinus rhythm.  Asymptomatic SVT.       ______________________________________________________________________________________________      Recent Labs: 08/21/2023: BUN 21; Creatinine, Ser 0.94; Hemoglobin 12.6; Platelets 238; Potassium 3.8; Sodium 138  Recent Lipid Panel    Component Value Date/Time   CHOL 100 12/06/2022 1119   TRIG 102 12/06/2022 1119  HDL 36 (L) 12/06/2022 1119   CHOLHDL 2.8 12/06/2022 1119   CHOLHDL 5.5 08/29/2021 2340   VLDL 16 08/29/2021 2340   LDLCALC 45 12/06/2022 1119       Physical Exam:    VS:  BP (!) 130/90 (BP Location: Right Arm)   Pulse 80   Ht 6' (1.829 m)   Wt 120.2 kg   SpO2 95%   BMI 35.94 kg/m     Wt Readings from Last 3 Encounters:  01/23/24 120.2 kg  12/26/23 112.2 kg  11/07/23 120.5 kg    Gen: No distress   Neck: No JVD, bilateral soft carotid bruit Ears: Bilateral Homero Fellers Sign Cardiac: No Rubs or Gallops, no murmur, regular rhythm, +2 radial pulsess Respiratory: Clear to auscultation bilaterally, normal effort, normal  respiratory rate GI: Soft, nontender, non-distended  MS: Trace pitting edema  Integument: Skin feels warm Neuro:  At time of evaluation, alert and oriented to person/place/time/situation Psych: Normal mood and affect  ASSESSMENT:    1. Essential hypertension   2. PAD (peripheral artery disease) (HCC)   3. Familial hypercholesteremia      PLAN:    HTN - Elevated above goal - I offered addition of hydrochlorothiazide 12.5 mg PO BID; he declined- preference is for less medications - decrease salt intake and increase exercise - if BP is still DBP 90 in April will start medication  PAD - no improvement in cilastazol - LDL at goal  HLD Aortic atherosclerosis - wants less meds - will stop zetia - recheck labs in 3 months; LDL goal < 55  Cerebellar stroke PFO with Rope Score of   4, Familial Hypercholesterolemia - his CAD is now followed by neurology/Dr. Pearlean Brownie  P-SVT (asymptomatic) Asymptomatic no med changes  One year me or my team       Medication Adjustments/Labs and Tests Ordered: Current medicines are reviewed at length with the patient today.  Concerns regarding medicines are outlined above.  No orders of the defined types were placed in this encounter.  No orders of the defined types were placed in this encounter.   Patient Instructions  Medication Instructions:  Your physician has recommended you make the following change in your medication:  STOP: Zetia  *If you need a refill on your cardiac medications before your next appointment, please call your pharmacy*  Lab Work: NONE  If you have labs (blood work) drawn today and your tests are completely normal, you will receive your results only by: MyChart Message (if you have MyChart) OR A paper copy in the mail If you have any lab test that is abnormal or we need to change your treatment, we will call you to review the results.  Testing/Procedures: NONE  Follow-Up: At [facility], you and your health needs are our priority.  As part of our continuing mission to provide you with exceptional heart care, our providers are all part of one team.  This team includes your primary Cardiologist (physician) and Advanced Practice Providers or APPs (Physician Assistants and Nurse Practitioners) who all work together to provide you with the care you need, when you need it.  Your next appointment:   12 month(s)  Provider:   Christell Constant, MD      Other Instructions       1st Floor: - Lobby - Registration  - Pharmacy  - Lab - Cafe  2nd Floor: - PV Lab - Diagnostic Testing (echo, CT, nuclear med)  3rd Floor: - Vacant  4th  Floor: - TCTS (cardiothoracic surgery) - AFib Clinic - Structural Heart Clinic - Vascular Surgery  - Vascular Ultrasound  5th Floor: -  HeartCare Cardiology (general and EP) - Clinical Pharmacy for coumadin, hypertension, lipid, weight-loss medications, and med management appointments    Valet parking services will be available as well.      Signed, Christell Constant, MD  01/23/2024 5:27 PM    [facility]

## 2024-01-23 NOTE — Patient Instructions (Signed)
 Medication Instructions:  Your physician has recommended you make the following change in your medication:  STOP: Zetia  *If you need a refill on your cardiac medications before your next appointment, please call your pharmacy*  Lab Work: NONE  If you have labs (blood work) drawn today and your tests are completely normal, you will receive your results only by: MyChart Message (if you have MyChart) OR A paper copy in the mail If you have any lab test that is abnormal or we need to change your treatment, we will call you to review the results.  Testing/Procedures: NONE  Follow-Up: At [facility], you and your health needs are our priority.  As part of our continuing mission to provide you with exceptional heart care, our providers are all part of one team.  This team includes your primary Cardiologist (physician) and Advanced Practice Providers or APPs (Physician Assistants and Nurse Practitioners) who all work together to provide you with the care you need, when you need it.  Your next appointment:   12 month(s)  Provider:   Christell Constant, MD      Other Instructions       1st Floor: - Lobby - Registration  - Pharmacy  - Lab - Cafe  2nd Floor: - PV Lab - Diagnostic Testing (echo, CT, nuclear med)  3rd Floor: - Vacant  4th Floor: - TCTS (cardiothoracic surgery) - AFib Clinic - Structural Heart Clinic - Vascular Surgery  - Vascular Ultrasound  5th Floor: - HeartCare Cardiology (general and EP) - Clinical Pharmacy for coumadin, hypertension, lipid, weight-loss medications, and med management appointments    Valet parking services will be available as well.

## 2024-04-04 ENCOUNTER — Other Ambulatory Visit: Payer: Self-pay | Admitting: Internal Medicine

## 2024-04-23 ENCOUNTER — Encounter (HOSPITAL_COMMUNITY): Payer: Self-pay | Admitting: Interventional Radiology

## 2024-06-08 ENCOUNTER — Encounter: Payer: Self-pay | Admitting: Physical Medicine & Rehabilitation

## 2024-06-08 ENCOUNTER — Encounter: Payer: Medicare Other | Attending: Physical Medicine & Rehabilitation | Admitting: Physical Medicine & Rehabilitation

## 2024-06-08 VITALS — BP 157/91 | HR 74 | Ht 72.0 in | Wt 275.0 lb

## 2024-06-08 DIAGNOSIS — H4921 Sixth [abducent] nerve palsy, right eye: Secondary | ICD-10-CM | POA: Insufficient documentation

## 2024-06-08 DIAGNOSIS — G510 Bell's palsy: Secondary | ICD-10-CM | POA: Diagnosis not present

## 2024-06-08 DIAGNOSIS — I69393 Ataxia following cerebral infarction: Secondary | ICD-10-CM | POA: Insufficient documentation

## 2024-06-08 NOTE — Progress Notes (Signed)
 Subjective:    Patient ID: Maurice Little, male    DOB: November 10, 1955, 68 y.o.   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  He has followed up with interventional radiology as well as neurology. Good healing after bimalleolar fracture RLE  Needs assist with shower  on bench  Some assist with dressing,   No further falls   Diplopia resolved  Still with incomplete eye closure, using spray and drops from Neuro optho  Still has numbness Left Side    Pain Inventory Average Pain 0 Pain Right Now 0 My pain is Numbness, tingling constant  LOCATION OF PAIN  Left side of the body from head to toe  BOWEL Number of stools per week: 7 Oral laxative use No   BLADDER Normal    Mobility use a walker ability to climb steps?  yes do you drive?  no use a wheelchair transfers alone Do you have any goals in this area?  yes  Function disabled: date disabled 2022 retired I need assistance with the following:  meal prep, household duties, and shopping Do you have any goals in this area?  yes  Neuro/Psych weakness numbness tremor tingling trouble walking spasms dizziness   Prior Studies Any changes since last visit?  no  Physicians involved in your care Any changes since last visit?  no   Family History  Problem Relation Age of Onset   Healthy Mother    Social History   Socioeconomic History   Marital status: Married    Spouse name: Jody   Number of children: Not on file   Years of education: Not on file   Highest education level: Bachelor's degree (e.g., BA, AB, BS)  Occupational History   Not on file  Tobacco Use   Smoking status: Former    Current packs/day: 0.00    Types: Cigarettes    Passive exposure: Past   Smokeless tobacco: Never   Tobacco comments:    Quit 2000  Vaping Use   Vaping status: Never Used  Substance and Sexual Activity   Alcohol use: Not  Currently   Drug use: Never   Sexual activity: Not Currently    Birth control/protection: Abstinence  Other Topics Concern   Not on file  Social History Narrative   Lives with wife   Social Drivers of Health   Financial Resource Strain: Low Risk  (11/04/2023)   Overall Financial Resource Strain (CARDIA)    Difficulty of Paying Living Expenses: Not hard at all  Food Insecurity: No Food Insecurity (11/04/2023)   Hunger Vital Sign    Worried About Running Out of Food in the Last Year: Never true    Ran Out of Food in the Last Year: Never true  Transportation Needs: No Transportation Needs (11/04/2023)   PRAPARE - Administrator, Civil Service (Medical): No    Lack of Transportation (Non-Medical): No  Physical Activity: Unknown (11/04/2023)   Exercise Vital Sign    Days of Exercise per Week: Patient declined    Minutes of Exercise per Session: Not on file  Stress: No Stress Concern Present (11/04/2023)   [employer] of Occupational Health - Occupational Stress Questionnaire    Feeling of Stress : Not at all  Social Connections: Moderately Isolated (11/04/2023)   Social Connection and Isolation Panel    Frequency of Communication with Friends and Family: More than three times a week  Frequency of Social Gatherings with Friends and Family: More than three times a week    Attends Religious Services: Never    Active Member of Clubs or Organizations: No    Attends Banker Meetings: Not on file    Marital Status: Married   Past Surgical History:  Procedure Laterality Date   BRAIN SURGERY  August 30 2022   Stints   BUBBLE STUDY  08/31/2021   Procedure: BUBBLE STUDY;  Surgeon: Raford Riggs, MD;  Location: [facility] ENDOSCOPY;  Service: Cardiovascular;;   ILIAC ARTERY STENT Left    IR ANGIO VERTEBRAL SEL SUBCLAVIAN INNOMINATE UNI L MOD SED  08/30/2021   IR ANGIO VERTEBRAL SEL SUBCLAVIAN INNOMINATE UNI R MOD SED  08/30/2021   IR CT HEAD LTD  08/30/2021   IR INTRA  CRAN STENT  08/30/2021   IR RADIOLOGIST EVAL & MGMT  11/28/2021   IR RADIOLOGIST EVAL & MGMT  01/12/2022   IR US  GUIDE VASC ACCESS RIGHT  08/30/2021   RADIOLOGY WITH ANESTHESIA N/A 08/30/2021   Procedure: IR WITH ANESTHESIA;  Surgeon: Dolphus Carrion, MD;  Location: [facility] OR;  Service: Radiology;  Laterality: N/A;   TEE WITHOUT CARDIOVERSION N/A 08/31/2021   Procedure: TRANSESOPHAGEAL ECHOCARDIOGRAM (TEE);  Surgeon: Raford Riggs, MD;  Location: [facility] ENDOSCOPY;  Service: Cardiovascular;  Laterality: N/A;   Past Medical History:  Diagnosis Date   Benign prostatic hyperplasia with elevated prostate specific antigen (PSA)    Closed bimalleolar fracture of right ankle 07/17/2023   Closed fracture of distal end of right fibula 07/08/2023   Hypertension    Iliac artery stenosis, left (HCC)    with claudication resolved with stent   PAD (peripheral artery disease) (HCC)    Stroke (HCC)    Ht 6' (1.829 m)   Wt 275 lb (124.7 kg)   BMI 37.30 kg/m   Opioid Risk Score:   Fall Risk Score:  `1  Depression screen [facility] 2/9     06/08/2024    3:35 PM 12/26/2023    9:31 AM 11/07/2023    8:26 AM 06/10/2023    3:27 PM 12/10/2022   10:47 AM 11/26/2022    8:40 AM 05/27/2022    2:33 PM  Depression screen PHQ 2/9  Decreased Interest 0 0 0 0 0 0 0  Down, Depressed, Hopeless 0 0 0 0 0 0 0  PHQ - 2 Score 0 0 0 0 0 0 0  Altered sleeping  0 0   0 0  Tired, decreased energy  0 0   0 0  Change in appetite  0 0   0 0  Feeling bad or failure about yourself   0 0   0 0  Trouble concentrating  0 0   0 0  Moving slowly or fidgety/restless  0 0   0 0  Suicidal thoughts  0 0   0 0  PHQ-9 Score  0 0   0 0  Difficult doing work/chores  Not difficult at all Not difficult at all   Not difficult at all Not difficult at all    Review of Systems  Musculoskeletal:  Positive for gait problem.  Neurological:  Positive for dizziness, tremors, weakness and numbness.  All other systems reviewed and are negative.       Objective:   Physical Exam Motor strength is 5/5 bilateral deltoid bicep tricep grip hip flexor knee extensor ankle dorsiflexor Sensation intact to light touch and pinprick on the left upper and left lower  limb. Cerebellar moderate ataxia right finger-nose to finger as well as right heel-to-shin. Speech with ataxic dysarthria moderate Standing balance is fair wide-based support unable to ambulate without assistive device.  Is limited to very short distances but does not have his ambulatory device with him right now.  He is using a scooter. Neuro:  Eyes without evidence of nystagmus  Tone is normal without evidence of spasticity   Cranial nerves II- Visual fields are intact to confrontation testing, no blurring of vision III- no evidence of ptosis, upward, downward and medial gaze intact IV- no vertical diplopia or head tilt V-right facial numbness to pinprick VI- no pupil abduction weakness VII- mild facial droop, mildly diminished right lid closure VII- normal auditory acuity on the left but impaired on the right side IX- no pharygeal weakness,  X- no pharyngeal weakness, no hoarseness XI- no trap or SCM weakness XII- no glossal weakness       Assessment & Plan:  1.  Brainstem infarct mainly pontine as well as some cerebellar involvement on the right side as well.  He has made some improvements with sensation on the left side in the last year.  His functional status remains unchanged.  He did have 1 fall in the last year which resulted in bimalleolar fracture on the right side.  He states he has had no subsequent falls and his wife concurs with this.  At this point I do not think he needs a another round of physical therapy but if he has any decline in his transfer ability standing balance or increasing falls this would be an option. He should follow-up with his PCP regarding weight gain that he mentioned during this visit

## 2024-06-21 DIAGNOSIS — H168 Other keratitis: Secondary | ICD-10-CM | POA: Diagnosis not present

## 2024-06-21 DIAGNOSIS — G464 Cerebellar stroke syndrome: Secondary | ICD-10-CM | POA: Diagnosis not present

## 2024-06-21 DIAGNOSIS — H532 Diplopia: Secondary | ICD-10-CM | POA: Diagnosis not present

## 2024-07-04 ENCOUNTER — Other Ambulatory Visit: Payer: Self-pay | Admitting: Physical Medicine & Rehabilitation

## 2024-07-05 ENCOUNTER — Other Ambulatory Visit (HOSPITAL_BASED_OUTPATIENT_CLINIC_OR_DEPARTMENT_OTHER): Payer: Self-pay | Admitting: *Deleted

## 2024-07-05 MED ORDER — ROSUVASTATIN CALCIUM 40 MG PO TABS: 40.0000 mg | ORAL_TABLET | Freq: Every day | ORAL | 3 refills | Status: AC

## 2024-07-07 ENCOUNTER — Other Ambulatory Visit (HOSPITAL_BASED_OUTPATIENT_CLINIC_OR_DEPARTMENT_OTHER): Payer: Self-pay | Admitting: *Deleted

## 2024-07-07 DIAGNOSIS — I10 Essential (primary) hypertension: Secondary | ICD-10-CM

## 2024-07-07 MED ORDER — LOSARTAN POTASSIUM 25 MG PO TABS: 25.0000 mg | ORAL_TABLET | Freq: Every day | ORAL | 1 refills | Status: AC

## 2024-07-21 NOTE — Progress Notes (Signed)
 Maurice Little                                          MRN: [id number]   07/21/2024   The [facility] Quality Team Specialist reviewed this patient medical record for the purposes of chart review for care gap closure. The following were reviewed: chart review for care gap closure-controlling blood pressure.    [facility]BCI Quality Team

## 2024-10-07 ENCOUNTER — Encounter (HOSPITAL_BASED_OUTPATIENT_CLINIC_OR_DEPARTMENT_OTHER): Payer: Self-pay | Admitting: Family Medicine

## 2024-10-07 DIAGNOSIS — I10 Essential (primary) hypertension: Secondary | ICD-10-CM

## 2024-10-07 MED ORDER — AMLODIPINE BESYLATE 5 MG PO TABS: 5.0000 mg | ORAL_TABLET | Freq: Every day | ORAL | 3 refills | Status: AC

## 2024-11-12 ENCOUNTER — Other Ambulatory Visit (HOSPITAL_COMMUNITY): Payer: Self-pay | Admitting: Radiology

## 2024-11-12 ENCOUNTER — Encounter (HOSPITAL_BASED_OUTPATIENT_CLINIC_OR_DEPARTMENT_OTHER): Payer: Self-pay | Admitting: Family Medicine

## 2024-11-12 ENCOUNTER — Ambulatory Visit (HOSPITAL_BASED_OUTPATIENT_CLINIC_OR_DEPARTMENT_OTHER): Payer: Medicare Other | Admitting: Family Medicine

## 2024-11-12 ENCOUNTER — Encounter (HOSPITAL_COMMUNITY): Payer: Self-pay

## 2024-11-12 VITALS — BP 162/117 | HR 80 | Temp 98.0°F | Resp 18 | Ht 72.0 in | Wt 283.0 lb

## 2024-11-12 DIAGNOSIS — I6521 Occlusion and stenosis of right carotid artery: Secondary | ICD-10-CM

## 2024-11-12 DIAGNOSIS — I639 Cerebral infarction, unspecified: Secondary | ICD-10-CM

## 2024-11-12 DIAGNOSIS — Z125 Encounter for screening for malignant neoplasm of prostate: Secondary | ICD-10-CM

## 2024-11-12 DIAGNOSIS — I651 Occlusion and stenosis of basilar artery: Secondary | ICD-10-CM

## 2024-11-12 DIAGNOSIS — I63119 Cerebral infarction due to embolism of unspecified vertebral artery: Secondary | ICD-10-CM

## 2024-11-12 DIAGNOSIS — I6322 Cerebral infarction due to unspecified occlusion or stenosis of basilar arteries: Secondary | ICD-10-CM

## 2024-11-12 DIAGNOSIS — Z0000 Encounter for general adult medical examination without abnormal findings: Secondary | ICD-10-CM | POA: Diagnosis not present

## 2024-11-12 DIAGNOSIS — G463 Brain stem stroke syndrome: Secondary | ICD-10-CM

## 2024-11-12 LAB — CBC WITH DIFFERENTIAL/PLATELET
Basophils Absolute: 0.1 x10E3/uL (ref 0.0–0.2)
Basos: 1 %
EOS (ABSOLUTE): 1 x10E3/uL — ABNORMAL HIGH (ref 0.0–0.4)
Eos: 10 %
Hematocrit: 49 % (ref 37.5–51.0)
Hemoglobin: 16.2 g/dL (ref 13.0–17.7)
Immature Grans (Abs): 0 x10E3/uL (ref 0.0–0.1)
Immature Granulocytes: 0 %
Lymphocytes Absolute: 3.3 x10E3/uL — ABNORMAL HIGH (ref 0.7–3.1)
Lymphs: 34 %
MCH: 30.9 pg (ref 26.6–33.0)
MCHC: 33.1 g/dL (ref 31.5–35.7)
MCV: 94 fL (ref 79–97)
Monocytes Absolute: 0.7 x10E3/uL (ref 0.1–0.9)
Monocytes: 8 %
Neutrophils Absolute: 4.7 x10E3/uL (ref 1.4–7.0)
Neutrophils: 47 %
Platelets: 292 x10E3/uL (ref 150–450)
RBC: 5.24 x10E6/uL (ref 4.14–5.80)
RDW: 13.2 % (ref 11.6–15.4)
WBC: 9.8 x10E3/uL (ref 3.4–10.8)

## 2024-11-12 LAB — COMPREHENSIVE METABOLIC PANEL WITH GFR
ALT: 21 IU/L (ref 0–44)
AST: 15 IU/L (ref 0–40)
Albumin: 4.6 g/dL (ref 3.9–4.9)
Alkaline Phosphatase: 100 IU/L (ref 47–123)
BUN/Creatinine Ratio: 14 (ref 10–24)
BUN: 16 mg/dL (ref 8–27)
Bilirubin Total: 0.5 mg/dL (ref 0.0–1.2)
CO2: 20 mmol/L (ref 20–29)
Calcium: 10 mg/dL (ref 8.6–10.2)
Chloride: 102 mmol/L (ref 96–106)
Creatinine, Ser: 1.14 mg/dL (ref 0.76–1.27)
Globulin, Total: 2.8 g/dL (ref 1.5–4.5)
Glucose: 110 mg/dL — ABNORMAL HIGH (ref 70–99)
Potassium: 4.7 mmol/L (ref 3.5–5.2)
Sodium: 138 mmol/L (ref 134–144)
Total Protein: 7.4 g/dL (ref 6.0–8.5)
eGFR: 70 mL/min/1.73

## 2024-11-12 LAB — FPSA% REFLEX
% FREE PSA: 16.1 %
PSA, FREE: 0.66 ng/mL

## 2024-11-12 LAB — HEMOGLOBIN A1C
Est. average glucose Bld gHb Est-mCnc: 117 mg/dL
Hgb A1c MFr Bld: 5.7 % — ABNORMAL HIGH (ref 4.8–5.6)

## 2024-11-12 LAB — LIPID PANEL
Chol/HDL Ratio: 3.4 ratio (ref 0.0–5.0)
Cholesterol, Total: 146 mg/dL (ref 100–199)
HDL: 43 mg/dL
LDL Chol Calc (NIH): 77 mg/dL (ref 0–99)
Triglycerides: 148 mg/dL (ref 0–149)
VLDL Cholesterol Cal: 26 mg/dL (ref 5–40)

## 2024-11-12 LAB — PSA TOTAL (REFLEX TO FREE): Prostate Specific Ag, Serum: 4.1 ng/mL — ABNORMAL HIGH (ref 0.0–4.0)

## 2024-11-12 NOTE — Progress Notes (Signed)
 " Subjective:    CC: Annual Physical Exam  HPI: Maurice Little is a 69 y.o. presenting for annual physical  I reviewed the past medical history, family history, social history, surgical history, and allergies today and no changes were needed.  Please see the problem list section below in epic for further details.  Past Medical History: Past Medical History:  Diagnosis Date   Benign prostatic hyperplasia with elevated prostate specific antigen (PSA)    Closed bimalleolar fracture of right ankle 07/17/2023   Closed fracture of distal end of right fibula 07/08/2023   Hypertension    Iliac artery stenosis, left    with claudication resolved with stent   PAD (peripheral artery disease)    Stroke [facility])    Past Surgical History: Past Surgical History:  Procedure Laterality Date   BRAIN SURGERY  August 30 2022   Stints   BUBBLE STUDY  08/31/2021   Procedure: BUBBLE STUDY;  Surgeon: Raford Riggs, MD;  Location: [facility] ENDOSCOPY;  Service: Cardiovascular;;   ILIAC ARTERY STENT Left    IR ANGIO VERTEBRAL SEL SUBCLAVIAN INNOMINATE UNI L MOD SED  08/30/2021   IR ANGIO VERTEBRAL SEL SUBCLAVIAN INNOMINATE UNI R MOD SED  08/30/2021   IR CT HEAD LTD  08/30/2021   IR INTRA CRAN STENT  08/30/2021   IR RADIOLOGIST EVAL & MGMT  11/28/2021   IR RADIOLOGIST EVAL & MGMT  01/12/2022   IR US  GUIDE VASC ACCESS RIGHT  08/30/2021   RADIOLOGY WITH ANESTHESIA N/A 08/30/2021   Procedure: IR WITH ANESTHESIA;  Surgeon: Dolphus Carrion, MD;  Location: [facility] OR;  Service: Radiology;  Laterality: N/A;   TEE WITHOUT CARDIOVERSION N/A 08/31/2021   Procedure: TRANSESOPHAGEAL ECHOCARDIOGRAM (TEE);  Surgeon: Raford Riggs, MD;  Location[facility] ENDOSCOPY;  Service: Cardiovascular;  Laterality: N/A;   Social History: Social History   Socioeconomic History   Marital status: Married    Spouse name: Jody   Number of children: Not on file   Years of education: Not on file   Highest education level: Bachelor's degree  (e.g., BA, AB, BS)  Occupational History   Not on file  Tobacco Use   Smoking status: Former    Current packs/day: 0.00    Types: Cigarettes    Passive exposure: Past   Smokeless tobacco: Never   Tobacco comments:    Quit 2000  Vaping Use   Vaping status: Never Used  Substance and Sexual Activity   Alcohol use: Not Currently   Drug use: Never   Sexual activity: Not Currently    Birth control/protection: Abstinence  Other Topics Concern   Not on file  Social History Narrative   Lives with wife   Social Drivers of Health   Tobacco Use: Medium Risk (11/12/2024)   Patient History    Smoking Tobacco Use: Former    Smokeless Tobacco Use: Never    Passive Exposure: Past  Physicist, Medical Strain: Low Risk (11/04/2023)   Overall Financial Resource Strain (CARDIA)    Difficulty of Paying Living Expenses: Not hard at all  Food Insecurity: No Food Insecurity (11/04/2023)   Hunger Vital Sign    Worried About Running Out of Food in the Last Year: Never true    Ran Out of Food in the Last Year: Never true  Transportation Needs: No Transportation Needs (11/04/2023)   PRAPARE - Administrator, Civil Service (Medical): No    Lack of Transportation (Non-Medical): No  Physical Activity: Unknown (11/04/2023)   Exercise Vital Sign  Days of Exercise per Week: Patient declined    Minutes of Exercise per Session: Not on file  Stress: No Stress Concern Present (11/04/2023)   [employer] of Occupational Health - Occupational Stress Questionnaire    Feeling of Stress : Not at all  Social Connections: Moderately Isolated (11/04/2023)   Social Connection and Isolation Panel    Frequency of Communication with Friends and Family: More than three times a week    Frequency of Social Gatherings with Friends and Family: More than three times a week    Attends Religious Services: Never    Database Administrator or Organizations: No    Attends Engineer, Structural: Not on file     Marital Status: Married  Depression (PHQ2-9): Low Risk (06/08/2024)   Depression (PHQ2-9)    PHQ-2 Score: 0  Alcohol Screen: Low Risk (12/26/2023)   Alcohol Screen    Last Alcohol Screening Score (AUDIT): 0  Housing: Low Risk (11/04/2023)   Housing Stability Vital Sign    Unable to Pay for Housing in the Last Year: No    Number of Times Moved in the Last Year: 0    Homeless in the Last Year: No  Utilities: Not At Risk (12/26/2023)   [employer] Utilities    Threatened with loss of utilities: No  Health Literacy: Not on file   Family History: Family History  Problem Relation Age of Onset   Healthy Mother    Allergies: Allergies[1] Medications: See med rec.  Review of Systems: No headache, visual changes, nausea, vomiting, diarrhea, constipation, dizziness, abdominal pain, skin rash, fevers, chills, night sweats, swollen lymph nodes, weight loss, chest pain, body aches, joint swelling, muscle aches, shortness of breath, mood changes, visual or auditory hallucinations.  Objective:    BP (!) 162/117 (BP Location: Left Arm, Patient Position: Sitting, Cuff Size: Normal)   Pulse 80   Temp 98 F (36.7 C) (Oral)   Resp 18   Ht 6' (1.829 m)   Wt 283 lb (128.4 kg)   SpO2 95%   BMI 38.38 kg/m   General: Well Developed, well nourished, and in no acute distress.  Neuro: Alert and oriented x3, extra-ocular muscles intact, sensation grossly intact. Cranial nerves II through XII are intact, motor, sensory, and coordinative functions are all intact. HEENT: Normocephalic, atraumatic, pupils equal round reactive to light, neck supple, no masses, no lymphadenopathy, thyroid nonpalpable. Oropharynx, nasopharynx, external ear canals are unremarkable. Skin: Warm and dry, no rashes noted.  Cardiac: Regular rate and rhythm, no murmurs rubs or gallops.  Respiratory: Clear to auscultation bilaterally. Not using accessory muscles, speaking in full sentences.  Abdominal: Soft, nontender, nondistended, positive  bowel sounds, no masses, no organomegaly.  Musculoskeletal: Patient with residual deficits from stroke, otherwise normal.  Impression and Recommendations:    Wellness examination Assessment & Plan: Routine HCM labs ordered. HCM reviewed/discussed. Anticipatory guidance regarding healthy weight, lifestyle and choices given. Recommend healthy diet. Recommend regular dental and vision exams Always use seatbelt/lap and shoulder restraints Recommend using smoke alarms and checking batteries at least twice a year Recommend using sunscreen when outside Discussed colon cancer screening recommendations, options.  Patient UTD - Cologuard in 2025 Discussed immunization recommendations The natural history of prostate cancer and ongoing controversy regarding screening and potential treatment outcomes of prostate cancer has been discussed with the patient. The meaning of a false positive PSA and a false negative PSA has been discussed. He indicates understanding of the limitations of this screening test and wishes  to proceed with screening PSA testing.  Orders: -     CBC with Differential/Platelet -     Comprehensive metabolic panel with GFR -     Hemoglobin A1c -     Lipid panel -     PSA Total (Reflex To Free)  Prostate cancer screening -     PSA Total (Reflex To Free)  Other orders -     %fPSA Reflex  Return in about 1 year (around 11/12/2025) for CPE.   ___________________________________________ Deneisha Dade de [place], MD, ABFM, CAQSM [facility]    [1]  Allergies Allergen Reactions   Duloxetine Tinitus    Other reaction(s): Other (See Comments) Dizziness Dizziness, tinnitus    "

## 2024-11-23 ENCOUNTER — Ambulatory Visit (HOSPITAL_BASED_OUTPATIENT_CLINIC_OR_DEPARTMENT_OTHER): Payer: Self-pay | Admitting: Family Medicine

## 2024-11-23 DIAGNOSIS — R972 Elevated prostate specific antigen [PSA]: Secondary | ICD-10-CM

## 2024-11-23 NOTE — Assessment & Plan Note (Signed)
 Routine HCM labs ordered. HCM reviewed/discussed. Anticipatory guidance regarding healthy weight, lifestyle and choices given. Recommend healthy diet. Recommend regular dental and vision exams Always use seatbelt/lap and shoulder restraints Recommend using smoke alarms and checking batteries at least twice a year Recommend using sunscreen when outside Discussed colon cancer screening recommendations, options.  Patient UTD - Cologuard in 2025 Discussed immunization recommendations The natural history of prostate cancer and ongoing controversy regarding screening and potential treatment outcomes of prostate cancer has been discussed with the patient. The meaning of a false positive PSA and a false negative PSA has been discussed. He indicates understanding of the limitations of this screening test and wishes to proceed with screening PSA testing.

## 2024-12-02 ENCOUNTER — Ambulatory Visit: Admitting: Neuroradiology

## 2024-12-02 ENCOUNTER — Encounter: Payer: Self-pay | Admitting: Neuroradiology

## 2024-12-02 VITALS — BP 152/100 | HR 75 | Temp 97.9°F | Ht 72.0 in | Wt 286.0 lb

## 2024-12-02 DIAGNOSIS — I6523 Occlusion and stenosis of bilateral carotid arteries: Secondary | ICD-10-CM | POA: Diagnosis not present

## 2024-12-02 DIAGNOSIS — Z95828 Presence of other vascular implants and grafts: Secondary | ICD-10-CM | POA: Diagnosis not present

## 2024-12-02 DIAGNOSIS — Z8673 Personal history of transient ischemic attack (TIA), and cerebral infarction without residual deficits: Secondary | ICD-10-CM

## 2024-12-02 NOTE — Progress Notes (Signed)
 I had the pleasure of meeting Maurice Little and his wife Myla in the office today.  He presented 08/29/2021 with a acute stroke syndrome.  CT arteriogram was done which showed occlusion of the right vertebral artery, with reconstitution in the mid cervical segment and a mild to moderate degree of narrowing of the mid basilar artery.  MRI 08/29/2021 showed a right PICA distribution infarct and a small right pontine infarct.  He underwent recanalization of the occluded right vertebral artery on 08/30/2021 by Dr. Monna, and a basilar artery stent was also placed.  Brain MRI on 09/01/2021 demonstrated a new stroke in the right lateral medulla and left side of the pons in addition to the pre-existing strokes.  He has not had any new stroke symptoms since that time.  He had an emergency room visit for severe epistaxis in 2024, after which his ticagrelor  was stopped.  I specifically of asked him about additional symptoms of stroke, TIA or amaurosis fugax, and he denies any new symptoms, especially within the last 6 months.  His primary complaints are sensory disturbance of his entire left side of the body, and ataxia on the right.  His comorbid medical conditions include hypertension for which he takes losartan  25 mg and amlodipine  5 mg.  He has dyslipidemia for which he takes rosuvastatin  40 mg.  He takes 40 mg pantoprazole  for GERD.  I reviewed all of his previous imaging studies, including the CT, CTA, and brain MRIs from his initial presentation in 2022, and his most recent CT arteriogram from 10/03/2023, and his carotid ultrasound from 02/28/2023.  Blood pressure is 152/100 today.  His heart rate is regular.  Lungs are clear.  Heart sounds are normal.  The right radial pulse is absent.  He has a normal left radial pulse.  He is alert with normal language function.  Very mildly dysarthric. There is no facial weakness or drift of either side.  There is a sensory loss on the left.  He has ataxia of his  right arm with finger-nose testing.  Imaging review:  His last CTA shows the vertebral origin stent and basilar stent are patent.  There is a stenosis of the right internal carotid artery which looks in the 60-70% range, and of the left internal carotid artery which looks at less than 50%.  His carotid ultrasound from 02/28/2023 shows velocity of 113/33 on the right, which is a little discordant with the CT arteriogram.  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  This was treated with right vertebral artery stent placement and basilar artery stent placement.  There were additional brainstem strokes after the procedure.  Asymptomatic right carotid artery stenosis.  Recommendation:  1.  Carotid ultrasound 2.  Continue 81 mg aspirin  and medical management of his vascular risk factors.  I spent more than 45 minutes with review of all of his previous imaging, history, interview and consultation

## 2024-12-14 ENCOUNTER — Ambulatory Visit (HOSPITAL_COMMUNITY)

## 2025-01-14 ENCOUNTER — Ambulatory Visit (HOSPITAL_BASED_OUTPATIENT_CLINIC_OR_DEPARTMENT_OTHER): Admitting: Family Medicine

## 2025-02-03 ENCOUNTER — Ambulatory Visit: Admitting: Internal Medicine

## 2025-06-09 ENCOUNTER — Ambulatory Visit: Admitting: Physical Medicine & Rehabilitation

## 2025-11-17 ENCOUNTER — Encounter (HOSPITAL_BASED_OUTPATIENT_CLINIC_OR_DEPARTMENT_OTHER): Admitting: Family Medicine
